# Patient Record
Sex: Male | Born: 1935 | Race: Black or African American | Hispanic: No | Marital: Married | Smoking: Former smoker
Health system: Southern US, Community
[De-identification: ages and names within clinical notes are randomized; demographics above are authoritative.]

## PROBLEM LIST (undated history)

## (undated) ENCOUNTER — Emergency Department (HOSPITAL_COMMUNITY): Admission: EM | Payer: MEDICARE | Source: Home / Self Care

## (undated) DIAGNOSIS — I2510 Atherosclerotic heart disease of native coronary artery without angina pectoris: Secondary | ICD-10-CM

## (undated) DIAGNOSIS — N186 End stage renal disease: Secondary | ICD-10-CM

## (undated) DIAGNOSIS — M1990 Unspecified osteoarthritis, unspecified site: Secondary | ICD-10-CM

## (undated) DIAGNOSIS — R609 Edema, unspecified: Secondary | ICD-10-CM

## (undated) DIAGNOSIS — E1140 Type 2 diabetes mellitus with diabetic neuropathy, unspecified: Secondary | ICD-10-CM

## (undated) DIAGNOSIS — I350 Nonrheumatic aortic (valve) stenosis: Secondary | ICD-10-CM

## (undated) DIAGNOSIS — E785 Hyperlipidemia, unspecified: Secondary | ICD-10-CM

## (undated) DIAGNOSIS — I951 Orthostatic hypotension: Secondary | ICD-10-CM

## (undated) DIAGNOSIS — I739 Peripheral vascular disease, unspecified: Secondary | ICD-10-CM

## (undated) DIAGNOSIS — D638 Anemia in other chronic diseases classified elsewhere: Secondary | ICD-10-CM

## (undated) DIAGNOSIS — Z7901 Long term (current) use of anticoagulants: Secondary | ICD-10-CM

## (undated) DIAGNOSIS — I10 Essential (primary) hypertension: Secondary | ICD-10-CM

## (undated) DIAGNOSIS — I4891 Unspecified atrial fibrillation: Secondary | ICD-10-CM

## (undated) DIAGNOSIS — N200 Calculus of kidney: Secondary | ICD-10-CM

## (undated) DIAGNOSIS — C61 Malignant neoplasm of prostate: Secondary | ICD-10-CM

## (undated) HISTORY — DX: Atherosclerotic heart disease of native coronary artery without angina pectoris: I25.10

## (undated) HISTORY — PX: AV FISTULA PLACEMENT: SHX1204

## (undated) HISTORY — DX: Calculus of kidney: N20.0

## (undated) HISTORY — DX: Edema, unspecified: R60.9

## (undated) HISTORY — DX: Unspecified atrial fibrillation: I48.91

## (undated) HISTORY — DX: Malignant neoplasm of prostate: C61

## (undated) HISTORY — DX: Peripheral vascular disease, unspecified: I73.9

## (undated) HISTORY — PX: HERNIA REPAIR: SHX51

## (undated) HISTORY — DX: Type 2 diabetes mellitus with diabetic neuropathy, unspecified: E11.40

## (undated) HISTORY — PX: TOE SURGERY: SHX1073

## (undated) HISTORY — DX: Essential (primary) hypertension: I10

## (undated) HISTORY — PX: VENTRAL HERNIA REPAIR: SHX424

## (undated) HISTORY — PX: POLYPECTOMY: SHX149

## (undated) HISTORY — PX: KIDNEY STONE SURGERY: SHX686

## (undated) HISTORY — DX: Long term (current) use of anticoagulants: Z79.01

## (undated) HISTORY — PX: RADIOACTIVE SEED IMPLANT: SHX5150

## (undated) HISTORY — DX: Hyperlipidemia, unspecified: E78.5

## (undated) HISTORY — PX: COLONOSCOPY: SHX174

## (undated) HISTORY — PX: TONSILLECTOMY: SHX5217

## (undated) HISTORY — PX: INGUINAL HERNIA REPAIR: SHX194

## (undated) HISTORY — DX: Unspecified osteoarthritis, unspecified site: M19.90

---

## 1994-01-08 HISTORY — PX: CORONARY ARTERY BYPASS GRAFT: SHX141

## 2001-12-09 ENCOUNTER — Encounter: Payer: Self-pay | Admitting: General Surgery

## 2001-12-09 ENCOUNTER — Encounter: Admission: RE | Admit: 2001-12-09 | Discharge: 2001-12-09 | Payer: Self-pay | Admitting: General Surgery

## 2003-04-13 HISTORY — PX: CARDIOVASCULAR STRESS TEST: SHX262

## 2003-04-19 ENCOUNTER — Inpatient Hospital Stay (HOSPITAL_BASED_OUTPATIENT_CLINIC_OR_DEPARTMENT_OTHER): Admission: RE | Admit: 2003-04-19 | Discharge: 2003-04-19 | Payer: Self-pay | Admitting: Cardiology

## 2003-04-19 HISTORY — PX: CARDIAC CATHETERIZATION: SHX172

## 2004-05-23 ENCOUNTER — Ambulatory Visit: Admission: RE | Admit: 2004-05-23 | Discharge: 2004-06-20 | Payer: Self-pay | Admitting: Radiation Oncology

## 2004-07-10 ENCOUNTER — Ambulatory Visit: Admission: RE | Admit: 2004-07-10 | Discharge: 2004-10-08 | Payer: Self-pay | Admitting: Radiation Oncology

## 2004-08-28 ENCOUNTER — Encounter: Admission: RE | Admit: 2004-08-28 | Discharge: 2004-08-28 | Payer: Self-pay | Admitting: Urology

## 2004-10-04 ENCOUNTER — Ambulatory Visit (HOSPITAL_COMMUNITY): Admission: RE | Admit: 2004-10-04 | Discharge: 2004-10-04 | Payer: Self-pay | Admitting: Urology

## 2004-10-04 ENCOUNTER — Ambulatory Visit (HOSPITAL_BASED_OUTPATIENT_CLINIC_OR_DEPARTMENT_OTHER): Admission: RE | Admit: 2004-10-04 | Discharge: 2004-10-04 | Payer: Self-pay | Admitting: Urology

## 2004-10-25 ENCOUNTER — Ambulatory Visit: Admission: RE | Admit: 2004-10-25 | Discharge: 2005-01-04 | Payer: Self-pay | Admitting: Radiation Oncology

## 2005-07-31 ENCOUNTER — Encounter: Payer: Self-pay | Admitting: Emergency Medicine

## 2005-08-01 ENCOUNTER — Encounter: Payer: Self-pay | Admitting: Cardiology

## 2005-08-01 ENCOUNTER — Observation Stay (HOSPITAL_COMMUNITY): Admission: EM | Admit: 2005-08-01 | Discharge: 2005-08-02 | Payer: Self-pay | Admitting: Cardiology

## 2006-07-08 ENCOUNTER — Ambulatory Visit: Payer: Self-pay | Admitting: Vascular Surgery

## 2006-08-01 ENCOUNTER — Ambulatory Visit: Payer: Self-pay | Admitting: *Deleted

## 2006-10-03 ENCOUNTER — Ambulatory Visit: Payer: Self-pay | Admitting: *Deleted

## 2007-10-02 ENCOUNTER — Ambulatory Visit: Payer: Self-pay | Admitting: *Deleted

## 2008-09-30 ENCOUNTER — Ambulatory Visit: Payer: Self-pay | Admitting: Vascular Surgery

## 2009-09-02 ENCOUNTER — Ambulatory Visit: Payer: Self-pay | Admitting: Cardiology

## 2009-09-06 ENCOUNTER — Ambulatory Visit: Payer: Self-pay | Admitting: Cardiology

## 2009-09-06 ENCOUNTER — Ambulatory Visit (HOSPITAL_COMMUNITY): Admission: RE | Admit: 2009-09-06 | Discharge: 2009-09-06 | Payer: Self-pay | Admitting: Cardiology

## 2009-09-06 HISTORY — PX: US ECHOCARDIOGRAPHY: HXRAD669

## 2009-12-15 ENCOUNTER — Ambulatory Visit: Payer: Self-pay | Admitting: Vascular Surgery

## 2009-12-15 ENCOUNTER — Ambulatory Visit: Payer: Self-pay | Admitting: Surgery

## 2010-03-10 ENCOUNTER — Ambulatory Visit (INDEPENDENT_AMBULATORY_CARE_PROVIDER_SITE_OTHER): Payer: MEDICARE | Admitting: Cardiology

## 2010-03-10 DIAGNOSIS — E119 Type 2 diabetes mellitus without complications: Secondary | ICD-10-CM

## 2010-03-10 DIAGNOSIS — I2510 Atherosclerotic heart disease of native coronary artery without angina pectoris: Secondary | ICD-10-CM

## 2010-03-10 DIAGNOSIS — I10 Essential (primary) hypertension: Secondary | ICD-10-CM

## 2010-03-10 DIAGNOSIS — Z951 Presence of aortocoronary bypass graft: Secondary | ICD-10-CM

## 2010-03-21 ENCOUNTER — Emergency Department (HOSPITAL_COMMUNITY): Payer: Medicare Other

## 2010-03-21 ENCOUNTER — Emergency Department (HOSPITAL_COMMUNITY)
Admission: EM | Admit: 2010-03-21 | Discharge: 2010-03-21 | Disposition: A | Discharge status: Home / Self Care | Payer: Medicare Other | Attending: Emergency Medicine | Admitting: Emergency Medicine

## 2010-03-21 DIAGNOSIS — N289 Disorder of kidney and ureter, unspecified: Secondary | ICD-10-CM | POA: Insufficient documentation

## 2010-03-21 DIAGNOSIS — I498 Other specified cardiac arrhythmias: Secondary | ICD-10-CM | POA: Insufficient documentation

## 2010-03-21 DIAGNOSIS — I10 Essential (primary) hypertension: Secondary | ICD-10-CM | POA: Insufficient documentation

## 2010-03-21 DIAGNOSIS — Z8546 Personal history of malignant neoplasm of prostate: Secondary | ICD-10-CM | POA: Insufficient documentation

## 2010-03-21 DIAGNOSIS — R42 Dizziness and giddiness: Secondary | ICD-10-CM | POA: Insufficient documentation

## 2010-03-21 DIAGNOSIS — I25810 Atherosclerosis of coronary artery bypass graft(s) without angina pectoris: Secondary | ICD-10-CM | POA: Insufficient documentation

## 2010-03-21 DIAGNOSIS — R0602 Shortness of breath: Secondary | ICD-10-CM | POA: Insufficient documentation

## 2010-03-21 DIAGNOSIS — E119 Type 2 diabetes mellitus without complications: Secondary | ICD-10-CM | POA: Insufficient documentation

## 2010-03-21 LAB — POCT I-STAT, CHEM 8
BUN: 67 mg/dL — ABNORMAL HIGH (ref 6–23)
Creatinine, Ser: 5.5 mg/dL — ABNORMAL HIGH (ref 0.4–1.5)
Glucose, Bld: 340 mg/dL — ABNORMAL HIGH (ref 70–99)
Sodium: 132 mEq/L — ABNORMAL LOW (ref 135–145)
TCO2: 18 mmol/L (ref 0–100)

## 2010-03-21 LAB — URINALYSIS, ROUTINE W REFLEX MICROSCOPIC
Bilirubin Urine: NEGATIVE
Glucose, UA: 250 mg/dL — AB
Hgb urine dipstick: NEGATIVE
Ketones, ur: NEGATIVE mg/dL
Protein, ur: NEGATIVE mg/dL

## 2010-03-21 LAB — CBC
HCT: 35.8 % — ABNORMAL LOW (ref 39.0–52.0)
MCH: 31.7 pg (ref 26.0–34.0)
MCHC: 32.7 g/dL (ref 30.0–36.0)
MCV: 97 fL (ref 78.0–100.0)
RDW: 13.7 % (ref 11.5–15.5)

## 2010-03-21 LAB — DIFFERENTIAL
Eosinophils Absolute: 0.2 10*3/uL (ref 0.0–0.7)
Eosinophils Relative: 2 % (ref 0–5)
Lymphocytes Relative: 17 % (ref 12–46)
Lymphs Abs: 1.4 10*3/uL (ref 0.7–4.0)
Monocytes Absolute: 0.6 10*3/uL (ref 0.1–1.0)
Monocytes Relative: 7 % (ref 3–12)

## 2010-03-21 LAB — GLUCOSE, CAPILLARY
Glucose-Capillary: 386 mg/dL — ABNORMAL HIGH (ref 70–99)
Glucose-Capillary: 130 mg/dL — ABNORMAL HIGH (ref 70–99)
Glucose-Capillary: 121 mg/dL — ABNORMAL HIGH (ref 70–99)

## 2010-04-03 ENCOUNTER — Encounter: Payer: Self-pay | Admitting: Cardiology

## 2010-04-03 ENCOUNTER — Inpatient Hospital Stay (HOSPITAL_COMMUNITY)
Admission: EM | Admit: 2010-04-03 | Discharge: 2010-04-05 | DRG: 394 | Disposition: A | Discharge status: Home / Self Care | Payer: Medicare Other | Attending: Internal Medicine | Admitting: Internal Medicine

## 2010-04-03 DIAGNOSIS — E860 Dehydration: Secondary | ICD-10-CM | POA: Diagnosis present

## 2010-04-03 DIAGNOSIS — M109 Gout, unspecified: Secondary | ICD-10-CM | POA: Diagnosis present

## 2010-04-03 DIAGNOSIS — Z79899 Other long term (current) drug therapy: Secondary | ICD-10-CM

## 2010-04-03 DIAGNOSIS — D500 Iron deficiency anemia secondary to blood loss (chronic): Secondary | ICD-10-CM | POA: Diagnosis present

## 2010-04-03 DIAGNOSIS — N179 Acute kidney failure, unspecified: Secondary | ICD-10-CM | POA: Diagnosis present

## 2010-04-03 DIAGNOSIS — N184 Chronic kidney disease, stage 4 (severe): Secondary | ICD-10-CM | POA: Diagnosis present

## 2010-04-03 DIAGNOSIS — T465X5A Adverse effect of other antihypertensive drugs, initial encounter: Secondary | ICD-10-CM | POA: Diagnosis present

## 2010-04-03 DIAGNOSIS — E119 Type 2 diabetes mellitus without complications: Secondary | ICD-10-CM | POA: Diagnosis present

## 2010-04-03 DIAGNOSIS — Z8546 Personal history of malignant neoplasm of prostate: Secondary | ICD-10-CM

## 2010-04-03 DIAGNOSIS — K648 Other hemorrhoids: Principal | ICD-10-CM | POA: Diagnosis present

## 2010-04-03 DIAGNOSIS — Z951 Presence of aortocoronary bypass graft: Secondary | ICD-10-CM

## 2010-04-03 DIAGNOSIS — I498 Other specified cardiac arrhythmias: Secondary | ICD-10-CM | POA: Diagnosis present

## 2010-04-03 DIAGNOSIS — D126 Benign neoplasm of colon, unspecified: Secondary | ICD-10-CM | POA: Diagnosis present

## 2010-04-03 DIAGNOSIS — I2510 Atherosclerotic heart disease of native coronary artery without angina pectoris: Secondary | ICD-10-CM | POA: Diagnosis present

## 2010-04-03 DIAGNOSIS — D631 Anemia in chronic kidney disease: Secondary | ICD-10-CM | POA: Diagnosis present

## 2010-04-03 DIAGNOSIS — I129 Hypertensive chronic kidney disease with stage 1 through stage 4 chronic kidney disease, or unspecified chronic kidney disease: Secondary | ICD-10-CM | POA: Diagnosis present

## 2010-04-03 DIAGNOSIS — N039 Chronic nephritic syndrome with unspecified morphologic changes: Secondary | ICD-10-CM | POA: Diagnosis present

## 2010-04-04 DIAGNOSIS — E119 Type 2 diabetes mellitus without complications: Secondary | ICD-10-CM

## 2010-04-04 DIAGNOSIS — D62 Acute posthemorrhagic anemia: Secondary | ICD-10-CM

## 2010-04-04 DIAGNOSIS — K625 Hemorrhage of anus and rectum: Secondary | ICD-10-CM

## 2010-04-04 DIAGNOSIS — R195 Other fecal abnormalities: Secondary | ICD-10-CM

## 2010-04-04 LAB — URINALYSIS, ROUTINE W REFLEX MICROSCOPIC
Glucose, UA: NEGATIVE mg/dL
Hgb urine dipstick: NEGATIVE
Ketones, ur: NEGATIVE mg/dL
Protein, ur: NEGATIVE mg/dL

## 2010-04-04 LAB — CBC
HCT: 34.3 % — ABNORMAL LOW (ref 39.0–52.0)
MCHC: 31.8 g/dL (ref 30.0–36.0)
MCV: 98.6 fL (ref 78.0–100.0)
RDW: 13.7 % (ref 11.5–15.5)

## 2010-04-04 LAB — BASIC METABOLIC PANEL
BUN: 44 mg/dL — ABNORMAL HIGH (ref 6–23)
CO2: 30 mEq/L (ref 19–32)
Chloride: 102 mEq/L (ref 96–112)
Creatinine, Ser: 3.8 mg/dL — ABNORMAL HIGH (ref 0.4–1.5)

## 2010-04-04 LAB — OCCULT BLOOD, POC DEVICE: Fecal Occult Bld: POSITIVE

## 2010-04-04 LAB — DIFFERENTIAL
Basophils Absolute: 0 10*3/uL (ref 0.0–0.1)
Eosinophils Relative: 2 % (ref 0–5)
Lymphocytes Relative: 19 % (ref 12–46)
Lymphs Abs: 1.5 10*3/uL (ref 0.7–4.0)
Monocytes Absolute: 0.5 10*3/uL (ref 0.1–1.0)

## 2010-04-04 LAB — TSH: TSH: 1.492 u[IU]/mL (ref 0.350–4.500)

## 2010-04-04 LAB — POCT CARDIAC MARKERS: CKMB, poc: <1 ng/mL — ABNORMAL LOW (ref 1.0–8.0)

## 2010-04-04 LAB — TROPONIN I: Troponin I: 0.06 ng/mL (ref 0.00–0.06)

## 2010-04-04 LAB — GLUCOSE, CAPILLARY: Glucose-Capillary: 166 mg/dL — ABNORMAL HIGH (ref 70–99)

## 2010-04-04 LAB — HEMOGLOBIN AND HEMATOCRIT, BLOOD
Hemoglobin: 11.6 g/dL — ABNORMAL LOW (ref 13.0–17.0)
Hemoglobin: 11.3 g/dL — ABNORMAL LOW (ref 13.0–17.0)

## 2010-04-04 LAB — CARDIAC PANEL(CRET KIN+CKTOT+MB+TROPI)
CK, MB: 1.5 ng/mL (ref 0.3–4.0)
Relative Index: 1.1 (ref 0.0–2.5)
Troponin I: 0.07 ng/mL — ABNORMAL HIGH (ref 0.00–0.06)

## 2010-04-04 LAB — CK TOTAL AND CKMB (NOT AT ARMC): Total CK: 129 U/L (ref 7–232)

## 2010-04-04 NOTE — H&P (Signed)
NAMEANTINIO, SANDERFER            ACCOUNT NO.:  [id number]  MEDICAL RECORD NO.:  [id number]           PATIENT TYPE:  E  LOCATION:  WLED                         FACILITY:  [facility]  PHYSICIAN:  Talmage Nap, MD  DATE OF BIRTH:  07-03-1931  DATE OF ADMISSION:  04/03/2010 DATE OF DISCHARGE:                             HISTORY & PHYSICAL   PRIMARY CARE PHYSICIAN:  Unassigned.  SOURCE OF INFORMATION:  History obtainable from the patient and the patient's spouse.  CHIEF COMPLAINT:  Painless rectal bleed and weakness of about 3 days' duration.  HISTORY OF PRESENT ILLNESS:  The patient is a 77 year old African- American male with history of coronary artery disease status post CABG, diabetes mellitus and hypertension, presenting to the Emergency Room with painless rectal bleed of about 3 days' duration and this was said to have been getting progressively worse.  The patient claimed that 3 days prior to presenting to the Emergency Room when he used the bathroom he noted that he was passing bright red blood in stool.  He denied any associated rectal pain.  He denied any associated abdominal discomfort. The patient claimed that he did not really pay much attention to it but as the day progressed, the rectal bleed was getting progressively worse and he was getting progressively weak and tired.  He denied any associated systemic symptoms, i.e., no fever.  No chills.  No rigor.  He also denied any history of chest pain or shortness of breath.  He claimed he was getting progressively weak.  Called his primary care physician who now requested the patient to come to the Emergency Room to be evaluated.  PAST MEDICAL HISTORY:  Positive for: 1. Coronary artery disease. 2. Hypertension. 3. Diabetes mellitus. 4. Gout. 5. Prostate CA. 6. Chronic kidney disease, not on dialysis.  PAST SURGICAL HISTORY: 1. Coronary artery disease status post CABG. 2. Ventral hernia repair. 3. Left  herniorrhaphy. 4. Right kidney stone status post nephostomy percutaneously. 5. Prostate CA status post seed implant.  MEDICATIONS:  Preadmission meds include: 1. Metoprolol, dose unknown. 2. Allopurinol 300 mg p.o. daily. 3. Vytorin 10/10 one a day. 4. Diltiazem 240 mg p.o. daily. 5. Lasix, dose unknown.  ALLERGIES:  He has no known allergies.  SOCIAL HISTORY:  Negative for tobacco use, occasionally take hard liquor and is retired.  Lives at home with his spouse.  FAMILY HISTORY:  Positive for coronary artery disease.  REVIEW OF SYSTEMS:  The patient denies any history of headaches.  No blurred vision.  Complained of dryness of the mouth.  No nausea.  No vomiting.  No chest pain or shortness of breath.  Complained about swelling that is painless on the right side of the neck.  Denies any PND or orthopnea.  No cough.  No abdominal discomfort.  No dysuria, hematuria but complained of hematochezia that is painless.  Has swelling of the lower extremity.  No intolerance to heat or cold and no neuropsychiatric disorder.  PHYSICAL EXAMINATION:  GENERAL:  Very pleasant elderly man, dehydrated, not in any obvious respiratory distress. VITAL SIGNS:  Blood pressure is 154/76, pulse is 59, respiratory rate 19, temperature is  97.7. HEENT:  Pallor but pupils are reactive to light and extraocular muscles are intact. NECK:  No jugular venous distention.  No carotid bruit.  No lymphadenopathy but has painless right neck swelling.  Cystic, nontender to touch. CHEST:  Clear to auscultation. HEART:  Heart sounds are 1 and 2. ABDOMEN:  Obese, nontender.  Liver, spleen, kidney not palpable.  Bowel sounds positive. EXTREMITIES:  +2 pedal edema. NEUROLOGIC:  Nonfocal. MUSCULOSKELETAL:  Arthritic changes in the knees and feet. NEUROPSYCHIATRIC:  Unremarkable. SKIN:  Slightly decreased turgor.  LABORATORY DATA:  Initial hematological indices showed WBC of 8.2, hemoglobin 10.9, hematocrit 34.3,  MCV of 98.6, platelet count of 167,000 with normal differential.  Chemistry revealed sodium of 141, potassium of 3.6, chloride of 102, bicarb of 30, glucose is 153, BUN 44, creatinine 3.80.  Coagulation profile showed PT 13.8, INR 1.04.  Urine microscopy unremarkable.  First set of cardiac markers, troponin I less than 0.05.  Fecal occult blood test positive.  EKG showed sinus bradycardia, Q-wave with nonspecific ST wave changes.  IMPRESSION: 1. Painless rectal bleed. 2. Anemia. 3. Dehydration. 4. Acute-on-chronic kidney disease. 5. Bradycardia. 6. Right neck swelling, painless. 7. Coronary artery disease status post coronary artery bypass     grafting. 8. Diabetes mellitus. 9. Gout. 10.Hypertension. 11.Prostate carcinoma status post seed implant.  PLAN:  Plan is to admit the patient to general medical floor.  The patient is going to be slowly rehydrated with half-normal saline IV to go at rate of 50 mL an hour.  He will be on Protonix 40 mg IV q.24h. Because of bradycardia, Lopressor and Cardizem will be on hold for now. Blood pressure will be controlled with hydralazine 25 mg p.o. b.i.d.  He will also be on Vytorin 10/10 one p.o. daily, allopurinol 300 mg p.o. daily and TED stockings for DVT prophylaxis.  The patient will also be on Accu-Cheks with Regular Insulin sliding scale.  Further labs ordered on this patient will include cardiac enzymes q.6h. x 3, H and H q.8h., thyroid panel which include TSH, T3 and T4 and hemoglobin A1c.  CBC, CMP and magnesium will be repeated in a.m.  Finally, GI physician will be consulted for further evaluation of this patient.  The patient will be followed and evaluated on day-to-day basis.     Talmage Nap, MD     CN/MEDQ  D:  04/04/2010  T:  04/04/2010  Job:  [id number]  Electronically Signed by Talmage Nap  on 04/04/2010 03:38:26 PM

## 2010-04-05 ENCOUNTER — Other Ambulatory Visit: Payer: Self-pay | Admitting: Internal Medicine

## 2010-04-05 DIAGNOSIS — D126 Benign neoplasm of colon, unspecified: Secondary | ICD-10-CM

## 2010-04-05 DIAGNOSIS — K648 Other hemorrhoids: Secondary | ICD-10-CM

## 2010-04-05 LAB — URINE CULTURE
Colony Count: NO GROWTH
Culture  Setup Time: 201203270848

## 2010-04-05 LAB — CARDIAC PANEL(CRET KIN+CKTOT+MB+TROPI)
CK, MB: 1.5 ng/mL (ref 0.3–4.0)
Total CK: 146 U/L (ref 7–232)
Troponin I: 0.08 ng/mL — ABNORMAL HIGH (ref 0.00–0.06)

## 2010-04-05 LAB — COMPREHENSIVE METABOLIC PANEL
ALT: 18 U/L (ref 0–53)
Calcium: 8.6 mg/dL (ref 8.4–10.5)
Creatinine, Ser: 3.26 mg/dL — ABNORMAL HIGH (ref 0.4–1.5)
GFR calc Af Amer: 22 mL/min — ABNORMAL LOW (ref >60)
Glucose, Bld: 151 mg/dL — ABNORMAL HIGH (ref 70–99)
Sodium: 141 mEq/L (ref 135–145)
Total Protein: 5.9 g/dL — ABNORMAL LOW (ref 6.0–8.3)

## 2010-04-05 LAB — GLUCOSE, CAPILLARY
Glucose-Capillary: 161 mg/dL — ABNORMAL HIGH (ref 70–99)
Glucose-Capillary: 154 mg/dL — ABNORMAL HIGH (ref 70–99)
Glucose-Capillary: 153 mg/dL — ABNORMAL HIGH (ref 70–99)

## 2010-04-05 LAB — DIFFERENTIAL
Basophils Absolute: 0 10*3/uL (ref 0.0–0.1)
Basophils Relative: 0 % (ref 0–1)
Lymphocytes Relative: 21 % (ref 12–46)
Neutro Abs: 4.8 10*3/uL (ref 1.7–7.7)

## 2010-04-05 LAB — HEMOGLOBIN A1C
Hgb A1c MFr Bld: 8.6 % — ABNORMAL HIGH (ref <5.7)
Mean Plasma Glucose: 200 mg/dL — ABNORMAL HIGH (ref <117)

## 2010-04-05 LAB — CBC
HCT: 31.2 % — ABNORMAL LOW (ref 39.0–52.0)
Hemoglobin: 9.8 g/dL — ABNORMAL LOW (ref 13.0–17.0)
RDW: 13.5 % (ref 11.5–15.5)
WBC: 6.9 10*3/uL (ref 4.0–10.5)

## 2010-04-05 LAB — MAGNESIUM: Magnesium: 1.7 mg/dL (ref 1.5–2.5)

## 2010-04-06 ENCOUNTER — Telehealth: Payer: Self-pay

## 2010-04-06 NOTE — Telephone Encounter (Signed)
Spoke with pt and he is aware of his previsit 04/11/10@11am  and his colon 04/20/10@9am . Pt aware of appt dates and times.

## 2010-04-07 NOTE — Discharge Summary (Signed)
NAMESHALOM, MCGUINESS            ACCOUNT NO.:  [id number]  MEDICAL RECORD NO.:  [id number]           PATIENT TYPE:  I  LOCATION:  1342                         FACILITY:  [facility]  PHYSICIAN:  Marinda Elk, M.D.DATE OF BIRTH:  1931/04/12  DATE OF ADMISSION:  04/03/2010 DATE OF DISCHARGE:  04/05/2010                              DISCHARGE SUMMARY   PRIMARY CARE DOCTOR:  Unassigned.  DISCHARGE DIAGNOSES: 1. Rectal bleeding most likely secondary to internal hemorrhoids. 2. Anemia. 3. Dehydration. 4. Acute on chronic kidney disease, stage IV. 5. Bradycardia secondary to metoprolol.  DISCHARGE MEDICATIONS: 1. Allopurinol 300 mg p.o. daily. 2. Aspirin 325 mg to start on April 12, 2008. 3. Diltiazem 240 mg daily. 4. Doxazosin 2 mg p.o. at bedtime. 5. Estazolam 2 mg 1/2 tablet at bedtime. 6. Lasix 80 mg p.o. b.i.d. 7. Glimepiride 4 mg daily. 8. Januvia 100 mg daily. 9. Klor-Con 20 mEq 2 tabs p.o. daily. 10.Mag oxide p.o. daily. 11.Metolazone 2.5 mg 1 tablet daily. 12.Metoprolol 50 mg 1/2 tablet b.i.d. 13.Vitamin B12 50,000 units weekly. 14.Vytorin 10/40 mg 1 tablet daily. 15.Zemplar 1 mcg daily.  PROCEDURES PERFORMED:  Colonoscopy shows three polyps removed, but it was normal but incomplete colonoscopy.  Internal hemorrhoids likely cause for recent bleeding.  CONSULTANTS:  GI, John N. Marina Goodell, MD  BRIEF ADMITTING HISTORY AND PHYSICAL:  This is a 77 year old male with past medical history of coronary artery disease, status post CABG; diabetes mellitus; hypertension;  chronic kidney disease, stage IV that comes in for 3 days of painless rectal bleeding.  Please refer to dictation on April 04, 2010, for further details.  PLAN: 1. Rectal bleeding secondary most likely to internal hemorrhoids.  He     was admitted.  GI was consulted.  They recommended colonoscopy.     Colonoscopy was done with results above.  His hemoglobin dropped     from 10.9 to 9.8.  He is currently  asymptomatic.  He will follow up     with [facility] as an outpatient to follow up on results of     colonoscopy.  We will hold his aspirin for a week and then he will     restart it.  He has an appointment for [facility] gastroenterologist     to follow up on colonoscopy results as an outpatient. 2. Anemia secondary to rectal bleeding and chronic kidney disease.  He     will follow up with [facility] as an outpatient.  We will continue     to hold his aspirin. 3. Dehydration, now resolved. 4. Acute on chronic kidney injury, stage IV, currently stable.  We     will hold his Lasix and metolazone for 1 day and then we will     resume 1 day after discharge.  VITALS ON THE DAY OF DISCHARGE:  Temperature 98, pulse 70, respirations 18, blood pressure 163/72.  He was sating 99% on room air.  LABORATORIES ON THE DAY OF DISCHARGE:  Cardiac enzymes showed 0.08.  His mag was 1.7.  His sodium was 141, potassium 3.1, chloride 103, bicarb of 31, glucose of 151, BUN of 37, creatinine  of 3.2.  LFTs within normal limits except for his alkaline phosphatase which is 37.  His white count is 6.9, hemoglobin of 9.8, and platelet count of 150.  His ANC is 4.8. Hemoglobin A1c is 8.6.  DISPOSITION:  The patient will follow up with his primary care doctor this week.  Here we will check a CBC to see how his hemoglobin is doing. He will also follow up with [facility] in 2-4 weeks to follow up on polyp results.     Marinda Elk, M.D.     AF/MEDQ  D:  04/05/2010  T:  04/06/2010  Job:  [id number]  Electronically Signed by Lambert Keto M.D. on 04/07/2010 84:69:62 PM

## 2010-04-11 ENCOUNTER — Ambulatory Visit (AMBULATORY_SURGERY_CENTER): Payer: MEDICARE

## 2010-04-11 ENCOUNTER — Encounter: Payer: Self-pay | Admitting: Internal Medicine

## 2010-04-11 VITALS — Ht 72.0 in | Wt 297.2 lb

## 2010-04-11 DIAGNOSIS — K625 Hemorrhage of anus and rectum: Secondary | ICD-10-CM

## 2010-04-11 MED ORDER — PEG-KCL-NACL-NASULF-NA ASC-C 100 G PO SOLR: 1.0000 | Freq: Once | ORAL | Status: AC

## 2010-04-11 NOTE — Procedures (Signed)
Summary: Colonoscopy  Patient: Alan [name] Note: All result statuses are Final unless otherwise noted.  Tests: (1) Colonoscopy (COL)   COL Colonoscopy           DONE     [facility]     [address] [place], [place]  [address]          COLONOSCOPY PROCEDURE REPORT          PATIENT:  Alan [name], Alan [name]  MR#:  [id number]     BIRTHDATE:  02-Jun-1931, 78 yrs. old  GENDER:  male     ENDOSCOPIST:  Wilhemina Bonito. Eda Keys, MD     REF. BY:  [facility],     PROCEDURE DATE:  04/05/2010     PROCEDURE:  Colonoscopy with snare polypectomy x 3     ASA CLASS:  Class II     INDICATIONS:  rectal bleeding     MEDICATIONS:   Fentanyl 125 mcg, Versed 12 mg          DESCRIPTION OF PROCEDURE:   After the risks benefits and     alternatives of the procedure were thoroughly explained, informed     consent was obtained.  Digital rectal exam was performed and     revealed no abnormalities.   The Pentax Colonoscope [id number]     endoscope was introduced through the anus and advanced to the     hepatic flexure, limited by difficult to sedate adequately, body     habitus.    The quality of the prep was excellent, using MiraLax.     The instrument was then slowly withdrawn as the colon was fully     examined.     <<PROCEDUREIMAGES>>          FINDINGS:  The exam could only be carried out to the hepatic     flexure due to limitations as above. No blood in the colon.Three     polyps were found in the sigmoid (68mm,10mm) and descending colon     (5mm) segments. Polyps were snared without cautery. Retrieval was     successful.   Otherwise normal incomplete colonoscopy without     other polyps, masses, vascular ectasias, diverticulosis or     inflammatory changes.   Retroflexed views in the rectum revealed     internal hemorrhoids.    The scope was then withdrawn from the     patient and the procedure completed.          COMPLICATIONS:  None     ENDOSCOPIC IMPRESSION:     1) Three  polyps - removed     2) Otherwise normal but incomplete colonoscopy (see above)     3) Internal hemorrhoids - likely cause for recent bleeding          RECOMMENDATIONS:     1)   OK FOR D/C HOME LATER TODAY WITH WIFE.     2) REPEAT COLONOSCOPY IN NEAR FUTURE AT [facility]     WITH PROPOFOL / MAC AND ADJUSTABLE ADULT SCOPE          ______________________________     Wilhemina Bonito. Eda Keys, MD          CC:  Hali Marry, MD; The Patient          n.     eSIGNED:   Wilhemina Bonito. Eda Keys at 04/05/2010 10:20 AM          Eusebio Me, Siri Cole, [phone]  Note: An exclamation mark (!) indicates a result that was not dispersed into the flowsheet. Document Creation Date: 04/05/2010 10:21 AM _______________________________________________________________________  (1) Order result status: Final Collection or observation date-time: 04/05/2010 10:08 Requested date-time:  Receipt date-time:  Reported date-time:  Referring Physician:   Ordering Physician: Fransico Setters [id number]) Specimen Source:  Source: Launa Grill Order Number: [id number] Lab site:

## 2010-04-20 ENCOUNTER — Encounter: Payer: Self-pay | Admitting: Internal Medicine

## 2010-04-20 ENCOUNTER — Ambulatory Visit (AMBULATORY_SURGERY_CENTER): Payer: MEDICARE | Admitting: Internal Medicine

## 2010-04-20 VITALS — BP 153/59 | HR 67 | Temp 98.1°F | Resp 20 | Ht 72.0 in | Wt 290.0 lb

## 2010-04-20 DIAGNOSIS — Z1211 Encounter for screening for malignant neoplasm of colon: Secondary | ICD-10-CM

## 2010-04-20 DIAGNOSIS — Z860100 Personal history of colon polyps, unspecified: Secondary | ICD-10-CM

## 2010-04-20 DIAGNOSIS — D126 Benign neoplasm of colon, unspecified: Secondary | ICD-10-CM

## 2010-04-20 LAB — GLUCOSE, CAPILLARY: Glucose-Capillary: 159 mg/dL — ABNORMAL HIGH (ref 70–99)

## 2010-04-20 MED ORDER — SODIUM CHLORIDE 0.9 % IV SOLN: 500.0000 mL | INTRAVENOUS | Status: DC

## 2010-04-20 NOTE — Progress Notes (Signed)
  Subjective:    Patient ID: Alan [name], male    DOB: 08/22/1931, 77 y.o.   MRN: [id number]  HPI    Review of Systems     Objective:   Physical Exam        Assessment & Plan:  To bathroom with assistance and use of cane.  Tol well.

## 2010-04-20 NOTE — Patient Instructions (Signed)
Please refer to green and blue discharge instructions sheet, as well as educational hand-out on polyps.  Call [phone] after 5 pm for questions or concerns.  [facility] staff will call you in the morning as a courtesy.

## 2010-04-21 ENCOUNTER — Telehealth: Payer: Self-pay | Admitting: *Deleted

## 2010-04-21 NOTE — Telephone Encounter (Signed)

## 2010-05-09 ENCOUNTER — Ambulatory Visit: Payer: Medicare Other | Admitting: Vascular Surgery

## 2010-05-23 ENCOUNTER — Encounter (INDEPENDENT_AMBULATORY_CARE_PROVIDER_SITE_OTHER): Payer: MEDICARE

## 2010-05-23 ENCOUNTER — Ambulatory Visit (INDEPENDENT_AMBULATORY_CARE_PROVIDER_SITE_OTHER): Payer: MEDICARE | Admitting: Vascular Surgery

## 2010-05-23 DIAGNOSIS — N186 End stage renal disease: Secondary | ICD-10-CM

## 2010-05-23 DIAGNOSIS — N184 Chronic kidney disease, stage 4 (severe): Secondary | ICD-10-CM

## 2010-05-23 DIAGNOSIS — Z01810 Encounter for preprocedural cardiovascular examination: Secondary | ICD-10-CM

## 2010-05-23 NOTE — Assessment & Plan Note (Signed)
OFFICE VISIT   Alan [name], Alan [name]  DOB:  1931-02-26                                       12/15/2009  [id number]#:[id number]   Patient is a very pleasant 77 year old gentleman who was last seen by  Dr. Madilyn Fireman in 2008.  He has peripheral vascular disease and is followed  with serial ABIs.  He returns to clinic today stating that he does have  problems with his legs; however, this is more related to diabetic  neuropathy.  He states that he is not having any problems claudication-  type symptoms.   PAST MEDICAL HISTORY:  Significant for diabetes and hypertension.   SOCIAL HISTORY:  He is married and lives with his wife.  He is retired.  He had a remote history of tobacco use with discontinuation  approximately 20 years ago.   FAMILY HISTORY:  Noncontributory.   REVIEW OF SYSTEMS:  Significant for pain in his legs when walking and  pain in his feet when lying flat.  All other interrogatories were  answered in the negative fashion.   PHYSICAL FINDINGS:  A very pleasant elderly gentleman who walks with a  cane.  Height was 6 feet.  Weight is 290 pounds.  Heart rate was 57,  blood pressure 125/67 with O2 sat 96%.  HEENT:  PERRLA, EOMI with normal  conjunctiva.  Trachea was midline.  Cardiac exam revealed a regular rate  and rhythm.  Lungs were clear.  Abdomen was soft, nontender.  There were  no major deformities of his musculoskeletal system.  Neurological exam  demonstrated a stocking-glove sensory loss in his lower extremities  bilaterally.  There were no ulcers or rashes on his legs.   LABORATORY RESULTS:  Bilateral ankle brachial and toe brachial indices  were unable to be obtained due to a known noncompressibility of the  bilateral brachial and tibial arteries.  The waveforms were __________  monophasic.  He had stable bilateral great toe pressures when compared  to the previous exam.  On 09/30/2008, the right toe pressure was 94 and  the left was 82.   Today, the right great toe pressure was 85, and the  left was 73.   ASSESSMENT/PLAN:  We will see patient back in 1 year for follow-up.  He  was instructed in maintaining careful surveillance of his feet due to  his vascular insufficiency and diabetic neuropathies.  Should he develop  worsening symptoms prior to his next visit, he knows to give [place] a call.   He will continue under the care of Dr. Kathrene Bongo for his kidneys and  his cardiologist for his coronary artery disease.   Wilmon Arms, PA   Janetta Hora. Fields, MD  Electronically Signed   KEL/MEDQ  D:  12/15/2009  T:  12/15/2009  Job:  [id number]

## 2010-05-23 NOTE — Procedures (Signed)
LOWER EXTREMITY ARTERIAL EVALUATION-SINGLE LEVEL   INDICATION:  Follow-up evaluation for bilateral lower extremity pain and  known peripheral vascular disease.   HISTORY:  Patient complains of nocturnal legs cramping and has known peripheral  vascular disease.  Diabetes:  Orally controlled.  Cardiac:  Coronary artery bypass graft in 1998.  Hypertension:  Yes.  Smoking:  No.  Previous Surgery:  Right fifth toe amputation.   REFERRING PHYSICIAN:  Veverly Fells. Altheimer, M.D.   RESTING SYSTOLIC PRESSURES: (ABI)                          RIGHT                LEFT  Brachial:               160                  168  Anterior tibial:        90 (0.54)            78 (0.46)  Posterior tibial:       88                   218 (calcified)  Peroneal:  DOPPLER WAVEFORM ANALYSIS:  Anterior tibial:        Monophasic           Monophasic  Posterior tibial:       Monophasic           Monophasic  Peroneal:   PREVIOUS ABI'S:  Date: September 18, 2004,  RIGHT:  0.56  LEFT:  0.73   IMPRESSION:  The right ABI is stable from previous study.   The left posterior tibial artery is calcified, however, the ABI  calculated by the left anterior tibial artery is lower than previously  recorded.   ___________________________________________  P. Liliane Bade, M.D.   MC/MEDQ  D:  07/08/2006  T:  07/08/2006  Job:  [id number]

## 2010-05-23 NOTE — Assessment & Plan Note (Signed)
OFFICE VISIT   MANDELL, PANGBORN  DOB:  1931-08-07                                       10/03/2006  [id number]#:[id number]   The patient returns today for continued followup of his peripheral  vascular disease.  Underwent lower extremity arterial evaluation  07/08/2006 revealing bilateral monophasic tibial wave forms.  Ankle  brachial index 0.54 on the right, 0.46 on the left.  These have declined  over the past couple of years from values of 0.56 and 0.73 respectively.   Complains of chronic problems with edema.  Limited activity level.  Obesity remains a problems.   BP 160/85, pulse 63, respirations 18.  Lower extremity evaluation  reveals no palpable distal pulses.  No acute ischemic changes.  Skin is  intact in his feet.  No evidence of infection.   Will continue monitoring on a yearly basis.  Return ankle brachial index  at that time.   Balinda Quails, M.D.  Electronically Signed   PGH/MEDQ  D:  10/03/2006  T:  10/04/2006  Job:  [id number]

## 2010-05-23 NOTE — Assessment & Plan Note (Signed)
OFFICE VISIT   JAMARIS, THEARD  DOB:  21-May-1931                                       08/01/2006  [id number]#:[id number]   Mr. Payette returns today for yearly followup.  In the Vascular Lab  July 08, 2006, reveal ABIs of 0.54 on the right, 0.46 on the left.  There has been a significant drop on the left from a previous value of  0.73 in 2006.  The right remains stable. He has no night pain or rest  pain.  Denies nonhealing ulcers or gangrene.  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  He has claudication symptoms and chronic lower extremity edema.   Blood pressure is 152/79, pulse 67 per minute and regular respirations  18 per minute.  Femoral pulse is palpable bilaterally. No popliteal, posterior or tibial  dorsalis pedis pulses.  No ischemic skin changes of the feet.  Two to  three plus edema of the ankles.   Mr. Qazi remains quite stable.  We will plan to follow up with him  again in 1 year.  He is having some unsteadiness of his gait, I have  asked him to follow up with Dr. Leslie Dales regarding this.  His weight is  clearly a problem and I have recommended that he really look at trying  to lose some.   Balinda Quails, M.D.  Electronically Signed   PGH/MEDQ  D:  08/01/2006  T:  08/02/2006  Job:  [id number]   cc:   Veverly Fells. Altheimer, M.D.

## 2010-05-24 NOTE — Assessment & Plan Note (Signed)
OFFICE VISIT  Alan [name], Alan [name] DOB:  01/19/1931                                       05/23/2010 [id number]#:[id number]  Patient presents today for discussion regarding renal access.  He is a 77 year old gentleman with progressive chronic renal insufficiency.  He is known to our practice from a prior evaluation for lower extremity arterial insufficiency by Dr. Liliane Bade.  This has been stable.  He now has had progression of renal disease, and we are asked to see him for consideration for access placement.  He did undergo a vein map in our office today which I have reviewed and discussed with patient and his wife.  He is right-handed.  PHYSICAL EXAMINATION:  He is in no acute distress.  He has diminished brachial and radial pulses bilaterally.  He has moderate-sized surface veins bilaterally.  He underwent noninvasive vascular laboratory studies in our office, and this reveals acceptable size for attempted fistula in his right upper arm.  The veins are too small in the left arm.  I did review prior noninvasive studies in our office from approximately a year ago, and this showed noncompressible brachial arteries at that time.  I am concerned due to his arterial disease.  I explained to patient and his wife that it is uncommon that __________ limitation for venous access, but certainly I think that this may be his case.  He does have a palpable pulse on the right; therefore, I have recommended that we attempt a right upper arm AV fistula and explained that this may fail to mature.  I also explained the potential for steal.  We will schedule this at his earliest convenience on 05/24 at [facility].    Larina Earthly, M.D. Electronically Signed  TFE/MEDQ  D:  05/23/2010  T:  05/24/2010  Job:  [id number]  cc:   Alan [name], M.D.

## 2010-05-25 NOTE — Procedures (Unsigned)
CEPHALIC VEIN MAPPING  INDICATION:  Preoperative vein mapping for AVF placement.  HISTORY: Chronic kidney disease, stage 4, hypertension, diabetes.  EXAM: The right cephalic vein is compressible.  Diameter measurements range from 0.22 to 0.39 cm.  The right basilic vein is compressible.  Diameter measurements range from 0.35 to 0.36 cm.  The left cephalic vein is not compressible.  Diameter measurements range from 0.13 to 0.29 cm.  The left basilic vein is compressible.  Diameter measurements range from 0.20 to 0.27 cm.  See attached worksheet for all measurements.  IMPRESSION:  Patent right and left cephalic and basilic veins with diameter measurements as described above.  ___________________________________________ Larina Earthly, M.D.  EM/MEDQ  D:  05/23/2010  T:  05/23/2010  Job:  [id number]

## 2010-05-26 NOTE — Op Note (Signed)
Alan [name], Alan [name]            ACCOUNT NO.:  [id number]   MEDICAL RECORD NO.:  [id number]          PATIENT TYPE:  AMB   LOCATION:  NESC                         FACILITY:  [facility]   PHYSICIAN:  Bertram Millard. Dahlstedt, M.D.DATE OF BIRTH:  04/15/1931   DATE OF PROCEDURE:  10/04/2004  DATE OF DISCHARGE:                                 OPERATIVE REPORT   PREOPERATIVE DIAGNOSIS:  Adenocarcinoma of the prostate, clinical stage T1C,  Gleason 3+3.   POSTOPERATIVE DIAGNOSIS:  Adenocarcinoma of the prostate, clinical stage  T1C, Gleason 3+3.   PRINCIPAL PROCEDURE:  Placement of radioactive iodine seeds, cystoscopy.   SURGEON:  Bertram Millard. Retta Diones, M.D.   RADIATION ONCOLOGIST:  Maryln Gottron, M.D.   ANESTHESIA:  General endotracheal.   COMPLICATIONS:  None.   BRIEF HISTORY:  This 76 year old male presents at this time for placement of  iodine seeds/brachytherapy. He was recently diagnosed with adenocarcinoma of  the prostate which was eventually detected due to an elevating PSA, most  recently 5.7 before his ultrasound and biopsy which was done in the summer.  He was found to have bilateral adenocarcinoma, Gleason 3+3. The patient has  decided to undergo brachytherapy. He was counseled on the alternatives as  well as risks and complications. He desires to proceed.   DESCRIPTION OF PROCEDURE:  The patient was administered preoperative IV  antibiotics and a general anesthetic was placed in the lithotomy position.  Genitalia and perineum were prepped and draped. A Foley catheter was placed  with contrast in the balloon. A rectal tube was placed. Transrectal  ultrasound probe was placed by Dr. Dayton Scrape. The prostate, urethra and rectum  were imaged, and diagrammed within the computer program. The volume of the  prostate was under 25 mL. Once the computerized program of the prostate was  performed, and needle placement was determined, the needles were placed. A  total of 24 activated  needles were used to place 60 seeds. Each seed had  23.88 mCi. Following placement that was done according to the computerized  template, a fluoroscopic picture was taken of the seed placement. The  catheter was removed and cystoscopy revealed two spacers to be present  within the bladder. One of these was removed to confirm that they were  spacers and not radioactive seeds. One was left indwelling, as it was felt  that the patient would void this out. A Foley catheter was replaced after  cystoscopic procedure was performed. It was hooked to dependent drainage.   The patient tolerated the procedure well. He was awakened, extubated, taken  to PACU in stable condition.   He will be sent home on 3 days of Cipro 250 mg b.i.d., Vicodin 1-2 p.o. q.4  h p.r.n. pain (#15), and he will be given instructions for catheter removal.  He will follow-up in 3 weeks.      Bertram Millard. Dahlstedt, M.D.  Electronically Signed     SMD/MEDQ  D:  10/04/2004  T:  10/05/2004  Job:  [id number]   cc:   Maryln Gottron, M.D.  Fax: [phone]

## 2010-05-26 NOTE — Discharge Summary (Signed)
NAMEJAREL, CUADRA            ACCOUNT NO.:  [id number]   MEDICAL RECORD NO.:  [id number]          PATIENT TYPE:  OBV   LOCATION:  3711                         FACILITY:  [facility]   PHYSICIAN:  Peter M. Swaziland, M.D.  DATE OF BIRTH:  10/06/1931   DATE OF ADMISSION:  08/01/2005  DATE OF DISCHARGE:  08/02/2005                                 DISCHARGE SUMMARY   HISTORY OF PRESENT ILLNESS:  Mr. Carll is a 77 year old black male who  presents with symptoms of increased lower extremity edema.  He states that  normally they go down at night but that they have remained elevated.  He has  had no blistering.  He denies any shortness of breath, chest pain,  palpitations, dizziness, syncope or any other cardiac complaints.  He was  admitted for further evaluation of his increased edema.  The patient has a  history of coronary artery disease and is status post CABG.  He did have a  cardiac catheterization approximately 2 years ago that showed that all his  grafts were patent and his left ventricular function was normal.  He also  has a history of diabetes, hypertension, dyslipidemia and chronic venous  insufficiency.  For details of his past medical history, social history,  family history and physical exam, please see admission history and physical.   LABORATORY DATA:  His ECG shows normal sinus rhythm with low voltage.  There  is evidence of old septal infarction.  Hemoglobin 13.1, hematocrit 39.1,  white count 7900, platelets 222,000.  Sodium 141, potassium 4.3, chloride  101, CO2 27, BUN 24, creatinine 1.5, glucose of 113.  Coagulation studies  were normal.  Liver function studies were normal except for AST of 43,  albumin was 3.3.  Cardiac enzymes in serial fashion showed persistently mild  elevation of troponin levels, 0.08, 0.07, 0.07, 0.06.  CPK MBs were all  negative.  Magnesium 1.7.  Urinalysis was normal.  BNP level was 91.6 and D-  dimer level was 0.79.  Chest x-ray showed  cardiomegaly with some pulmonary  venous hypertension but no active disease.   HOSPITAL COURSE:  The patient was admitted to telemetry.  He was treated  with IV Lasix with modest diuresis.  He did have an echocardiogram performed  that showed mild concentric LVH with normal systolic function.  He had  normal estimated right heart pressures.  He had moderate aortic valve  sclerosis without stenosis.  He also had lower extremity venous Dopplers,  which were negative for DVT.  It was felt that his edema was due to chronic  venous insufficiency and dietary indiscretion.  We did increase his Lasix to  80 mg daily and start him on potassium supplement.  We recommended that he  wear thigh-high support hose when ambulatory and have reviewed with him  recommendations for strict sodium restriction.  The patient was felt to be  stable for discharge on 07July 26, 2007.   DISCHARGE DIAGNOSES:  1.  Lower extremity edema secondary to chronic venous insufficiency.  2.  Coronary disease status post coronary artery bypass grafting in 1998.  3.  Diabetes  mellitus type 2.  4.  Hypertension.  5.  Dyslipidemia.  6.  History of gout.  7.  History of prostate cancer.   DISCHARGE MEDICATIONS:  1.  Coated aspirin 325 mg daily.  2.  Zocor 80 mg per day.  3.  Allopurinol 300 mg daily.  4.  Gemfibrozil 600 mg twice a day.  5.  Glipizide 10 mg twice a day.  6.  Metformin 500 mg twice a day.  7.  Metoprolol 50 mg twice a day.  8.  Norvasc 10 mg daily.  9.  Quinapril 20 mg per day.  10. Lasix 80 mg per day.  11. Potassium 20 mEq daily.   The patient is to wear thigh-high support hose.  Will have follow-up with  Dr. Swaziland in 2 weeks.   DISCHARGE STATUS:  Improved.           ______________________________  Peter M. Swaziland, M.D.     PMJ/MEDQ  D:  08/02/2005  T:  08/02/2005  Job:  [id number]   cc:   Veverly Fells. Altheimer, M.D.  Fax: [phone]

## 2010-05-26 NOTE — Cardiovascular Report (Signed)
NAME:  Alan [name], Alan [name]                      ACCOUNT NO.:  [id number]   MEDICAL RECORD NO.:  [id number]                   PATIENT TYPE:  OIB   LOCATION:  6501                                 FACILITY:  [facility]   PHYSICIAN:  Peter M. Swaziland, M.D.               DATE OF BIRTH:  12/12/1931   DATE OF PROCEDURE:  04/19/2003  DATE OF DISCHARGE:                              CARDIAC CATHETERIZATION   INDICATION FOR PROCEDURE:  The patient is a 77 year old black male status  post coronary artery bypass surgery in 1996.  Routine followup is noted on  adenosine Cardiolite study to have evidence of anterior and apical ischemia.  He has been asymptomatic.  He has multiple cardiac risk factors including  history of diabetes, hypertension, hyperlipidemia.   PROCEDURE:  Left heart catheterization, coronary graft angiography, left  ventricular angiography and IMA angiography.   ACCESS:  Via the right femoral artery using standard Seldinger technique.   EQUIPMENT:  5 French 4 cm right and left Judkins catheter, 5 French pigtail  catheter, 5 French arterial sheath.   MEDICATIONS:  Local anesthesia with 1% Xylocaine.   CONTRAST:  150 mL of Omnipaque.   HEMODYNAMIC DATA:  Left ventricular pressure 144 with EDP of 22 mmHg.  Aortic pressure 147/73 with a mean of 106 mmHg.   ANGIOGRAPHIC DATA:  The left coronary artery arises and distributes  normally.  The left main coronary artery is without significant disease.   The left anterior descending artery is occluded proximally.   The left circumflex coronary has mild irregularities less than or equal to  20%.   The right coronary artery has a focal 80% stenosis at the mid vessel.  The  distal branches fill via a vein graft.   Saphenous vein graft to the first diagonal is widely patent.  There is  retrograde filling of a very small second diagonal branch.   Saphenous vein graft seemed to sequentially fill the acute marginal, PDA,  posterior lateral  branch of the right coronary artery.  This graft is widely  patent with excellent runoff.   LIMA graft to the LAD is widely patent with good distal runoff.  There is  small vessel disease in the far distal LAD that wrapped around the apex, but  no focal stenosis.   The left subclavian is widely patent with no stenosis.   LEFT VENTRICULAR ANGIOGRAPHY:  Performed in the RAO view demonstrates normal  left ventricular size and contractility with normal systolic function.  Ejection fraction is estimated at 60%.  There is no significant mitral  insufficiency or prolapse.   FINAL INTERPRETATION:  1. Severe two-vessel obstructive atherosclerotic coronary artery disease.  2. Continued graft patency including saphenous vein graft to the first     diagonal, saphenous vein graft sequentially to [facility] marginal, posterior     descending artery and posterior lateral branches of the right coronary     artery and patent left  internal mammary artery graft to the left anterior     descending.  3. Normal left ventricular function.   PLAN:  Would recommend continued medical therapy.                                               Peter M. Swaziland, M.D.    PMJ/MEDQ  D:  04/19/2003  T:  04/19/2003  Job:  [id number]   cc:   Veverly Fells. Altheimer, M.D.  [address]  [place]  [place] [address]  Fax: [phone]

## 2010-05-26 NOTE — H&P (Signed)
NAME:  Alan [name], Alan [name]                      ACCOUNT NO.:  [id number]   MEDICAL RECORD NO.:  [id number]                   PATIENT TYPE:  OIB   LOCATION:  NA                                   FACILITY:  [facility]   PHYSICIAN:  Peter M. Swaziland, M.D.               DATE OF BIRTH:  1931/06/07   DATE OF ADMISSION:  04/19/2003  DATE OF DISCHARGE:                                HISTORY & PHYSICAL   HISTORY OF PRESENT ILLNESS:  Mr. Eldridge Dace is a 77 year old black male who  has a known history of coronary artery disease.  He is status post coronary  artery bypass surgery in 1998 by Dr. Laneta Simmers.  At that time, he had an LIMA  graft placed to the LAD, a sequential saphenous vein graft to the first and  second diagonal branches, and a sequential saphenous vein graft to the acute  marginal PDA and posterolateral branches of the right coronary artery.  The  patient has done well from a symptomatic standpoint, but has been fairly  sedentary.  He denies any chest pain or shortness of breath.  He was  recently referred by Dr. Leslie Dales for followup evaluation.  Given the time  from his bypass surgery and his multiple cardiac risk factors, including  longstanding diabetes, we did perform a stress Cardiolite study.  The  patient had limited exercise tolerance and was found to have evidence of  anterior and apical ischemia by Cardiolite scan.  His ejection fraction was  normal at 56%.  Based on these abnormal findings, he is now admitted for  cardiac catheterization for further evaluation.   PAST MEDICAL HISTORY:  Significant for:  1. Diabetes mellitus, type 2, with associated diabetic retinopathy and     nephropathy, including previous amputation of his great toe.  2. History of hypertension.  3. History of dyslipidemia.  4. Chronic renal insufficiency with microalbuminuria.  5. History of peripheral vascular disease and venous insufficiency.  6. History of gout.  7. History of nephrolithiasis.   PAST  SURGICAL HISTORY:  1. Surgery for rectal abscess.  2. Previous hernia surgery x 2.   ALLERGIES:  He has no known allergies.   CURRENT MEDICATIONS:  1. Lasix 40 mg per day.  2. Tricor 160 mg per day.  3. Norvasc 10 mg per day.  4. Allopurinol 300 mg per day.  5. Lipitor 20 mg per day.  6. Coated aspirin 325 mg daily.  7. Accupril 20 mg per day.  8. Potassium 20 mEq daily.  9. Lopressor 50 mg per day.  10.      Amaryl 4 mg daily.  11.      Actos 30 mg per day.   SOCIAL HISTORY:  The patient is retired.  He is married.  He has two  children.  He denies tobacco use.   FAMILY HISTORY:  His father died at age 81 with myocardial infarction.  He  has  multiple family members with hypertension.   REVIEW OF SYSTEMS:  He has noticed a 30-pound weight gain over the past two  years.  He has chronic numbness of his feet.  He is fairly sedentary.  Otherwise no new medical complaints.  Review of systems otherwise negative.   PHYSICAL EXAMINATION:  GENERAL APPEARANCE:  The patient is an obese black  male in no apparent distress.  VITAL SIGNS:  He is afebrile.  Pulse is 70, respirations 20, and blood  pressure 130/80.  HEENT:  His pupils are equal, round, and reactive.  His conjunctivae are  without erythema.  His oropharynx is clear.  NECK:  Without JVD, adenopathy, thyromegaly, or bruits.  LUNGS:  Clear.  CARDIAC:  Regular rate and rhythm without murmur, rub, gallop, or click.  ABDOMEN:  Obese, soft, and nontender without masses or hepatosplenomegaly.  EXTREMITIES:  His femoral pulses are 2+ and symmetric.  He has absent pedal  pulses.  He has had a previous amputation of the toe.  SKIN:  Warm and dry.  NEUROLOGIC:  Significant for peripheral numbness, otherwise no focal  findings.   LABORATORY DATA:  Chest x-ray shows cardiomegaly and no active disease.  His  coagulation times are normal.  His white count is 5500, hemoglobin 12.1,  hematocrit 37, and platelets 210.  The sodium is 143,  potassium 4.6,  chloride 104, CO2 29, BUN 13, creatinine 0.9, and glucose 97.  His ECG at  rest shows normal sinus rhythm with poor R-wave progression in the anterior  precordial leads, otherwise normal.   IMPRESSION:  1. Abnormal stress Cardiolite study with diminished exercise capacity in a     patient who is otherwise asymptomatic.  2. Status post coronary artery bypass graft in 1998.  3. Diabetes mellitus, type 2.  4. Dyslipidemia.  5. Hypertension.  6. Peripheral neuropathy.  7. Peripheral vascular disease.  8. Chronic renal insufficiency.   PLAN:  The patient will undergo diagnostic cardiac catheterization.  Given  his history of chronic renal insufficiency, will hold his Lasix the day  before and the day of the test and also administer Mucomyst therapy.  Will  try and limit dye load as much as possible.                                                Peter M. Swaziland, M.D.    PMJ/MEDQ  D:  04/15/2003  T:  04/16/2003  Job:  [id number]   cc:   Veverly Fells. Altheimer, M.D.  [address]  [place]  [place] [address]  Fax: [phone]

## 2010-05-26 NOTE — H&P (Signed)
NAME:  FONTAINE, HEHL NO.:  [id number]   MEDICAL RECORD NO.:  [id number]          PATIENT TYPE:  EMS   LOCATION:  ED                           FACILITY:  [facility]   PHYSICIAN:  Ulyses Amor, MD DATE OF BIRTH:  03/02/1931   DATE OF ADMISSION:  08/01/2005  DATE OF DISCHARGE:                                HISTORY & PHYSICAL   HISTORY OF PRESENT ILLNESS:  Alan [name] is a 77 year old black man who  was admitted for observation to [facility] because of chronic lower  extremity edema.  The patient actually has chronic lower extremity edema.  He has noted in the last 10 days a gradual increase in his lower extremity  edema.  He presented to the emergency department this evening.  He has not  noted any dyspnea, nor has he noted any chest pain, tightness, heaviness,  pressure or squeezing.  He notes no diaphoresis or nausea.  He has been  taking his medications as prescribed, and he has not deviated from his usual  diet.  He has no history of congestive heart failure.   PAST MEDICAL HISTORY:  1.  The patient has a history of coronary artery disease.  He has previously      undergone coronary artery bypass surgery.  There was no history of      arrhythmia.  2.  Hypertension.  3.  Dyslipidemia.  4.  Diabetes mellitus.  5.  Gout.  6.  Prostate cancer.   MEDICATIONS:  1.  Allopurinol.  2.  Furosemide.  3.  Gemfibrozil.  4.  Glipizide.  5.  Metformin.  6.  Metoprolol.  7.  Norvasc.  8.  Quinapril.  9.  Simvastatin.  10. Aspirin.   ALLERGIES:  None.   OPERATIONS:  1.  Coronary artery bypass surgery.  2.  Toe amputation.   SOCIAL HISTORY:  The patient lives with his wife.  He does not work.  He  does not smoke cigarettes.  He drinks occasional alcohol.   FAMILY HISTORY:  Notable for coronary artery disease.   REVIEW OF SYSTEMS:  No new problems related to his head, eyes, ears, nose,  mouth, throat, lungs, gastrointestinal system,  genitourinary system, or  extremities.  There is no history of neurologic or psychiatric disorder.  There is no history of fever, chills, or weight loss.  The patient notes  rare, less than 5-second episodes of left hand tingling in recent months.   PHYSICAL EXAMINATION:  VITAL SIGNS:  Blood pressure 140/73, pulse 77 and  regular, respirations 20, temperature 97.7.  GENERAL:  The patient was an elderly black man in no discomfort.  He was  alert, oriented, appropriate and responsive.  HEENT:  Head, eyes, nose and mouth were normal.  NECK:  Without thyromegaly or adenopathy.  Carotid pulses were palpable  bilaterally and without bruits.  CARDIAC:  A normal S1 and S2.  There was no S3, S4, rub or click.  There was  a soft systolic ejection murmur heard at the left sternal border.  Cardiac  rhythm was regular.  No chest wall tenderness was noted.  LUNGS:  Clear.  ABDOMEN:  Soft and nontender.  There was no mass, hepatosplenomegaly, bruit,  distention, rebound, guarding or rigidity.  Bowel sounds were normal.  RECTAL/GENITAL:  Not performed, as they were not pertinent to the reason for  acute care hospitalization.  EXTREMITIES:  No deviation or deformity.  There was marked lower extremity  edema bilaterally.  Radial and dorsalis pedis pulses were palpable  bilaterally.  Brief screening neurologic survey was unremarkable.   The electrocardiogram revealed normal sinus rhythm.  The possibility of a  prior septal myocardial infarction could not be excluded.  Nonspecific T  wave flattening was noted.  QRS voltages were low.  A chest radiograph had  not yet been performed at the time of this dictation.  BNP was 91.6.  The  initial set of cardiac markers revealed a myoglobin of 106, CK-MB 1.8, and  troponin less than 0.05.  Potassium was 4.3, BUN 24, and creatinine 1.5.  White count was 7.9 with a hemoglobin of 13.1 and hematocrit of 39.0.  The  remaining studies were pending at the time of this  dictation.   IMPRESSION:  1.  Chronic lower extremity edema, increased in the last 10 days.  There is      no evidence to suggest congestive heart failure at this time (although      the chest radiograph is still pending), including no dyspnea, no      tachypnea, no hypoxia, clear lungs, and BNP of 92, and no prior history      of congestive heart failure.  2.  Coronary artery disease; status post coronary artery bypass surgery.  No      chest pain, tightness, heaviness, pressure or squeezing.  The patient      notes rare (less than 5 second) episodes of left hand tingling in recent      months.  3.  Hypertension.  4.  Dyslipidemia.  5.  Diabetes mellitus.  6.  Gout.  7.  Prostate cancer.   PLAN:  1.  Telemetry.  2.  Serial cardiac enzymes.  3.  Elevate legs.  4.  Furosemide.  5.  Further measures per Dr. Swaziland.      Ulyses Amor, MD  Electronically Signed     MSC/MEDQ  D:  08/01/2005  T:  08/01/2005  Job:  [id number]   cc:   Peter M. Swaziland, M.D.  Fax: [phone]

## 2010-05-29 ENCOUNTER — Telehealth: Payer: Self-pay | Admitting: Cardiology

## 2010-05-29 ENCOUNTER — Encounter (HOSPITAL_COMMUNITY)
Admission: RE | Admit: 2010-05-29 | Discharge: 2010-05-29 | Disposition: A | Discharge status: Home / Self Care | Payer: Medicare Other | Source: Ambulatory Visit | Attending: Vascular Surgery | Admitting: Vascular Surgery

## 2010-05-29 LAB — SURGICAL PCR SCREEN: MRSA, PCR: NEGATIVE

## 2010-05-29 NOTE — Telephone Encounter (Signed)
Fax: [phone] Last OV

## 2010-06-01 ENCOUNTER — Ambulatory Visit (HOSPITAL_COMMUNITY)
Admission: RE | Admit: 2010-06-01 | Discharge: 2010-06-01 | Disposition: A | Discharge status: Home / Self Care | Payer: Medicare Other | Source: Ambulatory Visit | Attending: Vascular Surgery | Admitting: Vascular Surgery

## 2010-06-01 DIAGNOSIS — I129 Hypertensive chronic kidney disease with stage 1 through stage 4 chronic kidney disease, or unspecified chronic kidney disease: Secondary | ICD-10-CM | POA: Insufficient documentation

## 2010-06-01 DIAGNOSIS — I120 Hypertensive chronic kidney disease with stage 5 chronic kidney disease or end stage renal disease: Secondary | ICD-10-CM

## 2010-06-01 DIAGNOSIS — I2510 Atherosclerotic heart disease of native coronary artery without angina pectoris: Secondary | ICD-10-CM | POA: Insufficient documentation

## 2010-06-01 DIAGNOSIS — Z01812 Encounter for preprocedural laboratory examination: Secondary | ICD-10-CM | POA: Insufficient documentation

## 2010-06-01 DIAGNOSIS — N186 End stage renal disease: Secondary | ICD-10-CM

## 2010-06-01 DIAGNOSIS — N189 Chronic kidney disease, unspecified: Secondary | ICD-10-CM | POA: Insufficient documentation

## 2010-06-01 DIAGNOSIS — E119 Type 2 diabetes mellitus without complications: Secondary | ICD-10-CM | POA: Insufficient documentation

## 2010-06-01 LAB — POCT I-STAT 4, (NA,K, GLUC, HGB,HCT)
Hemoglobin: 13.9 g/dL (ref 13.0–17.0)
Sodium: 142 mEq/L (ref 135–145)

## 2010-06-27 ENCOUNTER — Ambulatory Visit (INDEPENDENT_AMBULATORY_CARE_PROVIDER_SITE_OTHER): Payer: Medicare Other | Admitting: Vascular Surgery

## 2010-06-27 DIAGNOSIS — N186 End stage renal disease: Secondary | ICD-10-CM

## 2010-06-27 NOTE — Op Note (Signed)
  [name], Alan [name]            ACCOUNT NO.:  [id number]  MEDICAL RECORD NO.:  [id number]           PATIENT TYPE:  O  LOCATION:  SDSC                         FACILITY:  [facility]  PHYSICIAN:  Larina Earthly, M.D.    DATE OF BIRTH:  [birth year]  DATE OF PROCEDURE:  06/01/2010 DATE OF DISCHARGE:  06/01/2010                              OPERATIVE REPORT   PREOPERATIVE DIAGNOSIS:  Chronic renal insufficiency.  POSTOPERATIVE DIAGNOSIS:  Chronic renal insufficiency.  PROCEDURE:  Right upper arm arteriovenous fistula creation.  SURGEON:  Larina Earthly, MD  ASSISTANT:  Pecola Leisure, PA-C  ANESTHESIA:  MAC.  COMPLICATIONS:  None.  DISPOSITION:  To recovery room, stable.  PROCEDURE IN DETAIL:  The patient was taken to the operating room and placed in supine position where the right arm was prepped and draped in the usual sterile fashion.  Ultrasound was used to visualize the cephalic vein which was of moderate caliber at the antecubital space. The patient did have a brachial pulse, but no radial or ulnar pulse. The patient had extensively calcified vessels by prior ultrasound. Using local anesthesia, an incision was made over the antecubital space and carried down to isolate the cephalic vein which was of moderate caliber.  Tributary branches were ligated with 3-0 and 4-0 silk ties and divided.  The vein was mobilized proximally and distally and was ligated distally and divided distally.  The vein was gently dilated and it was felt to be adequate for fistula creation.  The brachial artery was exposed through the same incision.  The artery was very calcified, but did have a pulse.  The artery was occluded proximally and distally and was opened with an #11 blade and extended longitudinally with Potts scissors.  A small arteriotomy was created.  The vein was brought into approximation with the artery and was divided and was spatulated and sewn end to side to the artery with a  running 6-0 Prolene suture. Clamps were removed and excellent thrill was noted.  The wound was irrigated with saline.  Hemostasis was achieved with electrocautery. Wounds were closed with 3-0 Vicryl on the subcutaneous and subcuticular tissue.  Benzoin and Steri-Strips were applied.     Larina Earthly, M.D.     TFE/MEDQ  D:  06/01/2010  T:  06/02/2010  Job:  [id number]  Electronically Signed by Jabir Dahlem M.D. on 06/27/2010 01:10:12 PM

## 2010-06-28 NOTE — Assessment & Plan Note (Signed)
OFFICE VISIT  Alan [name], Alan [name] DOB:  1931-05-26                                       06/27/2010 ZOXWR#:[id number]  The patient presents today for follow-up of his right upper arm AV fistula created by myself on Jun 01, 2010.  His incision is healed completely and he has no discomfort related to this.  He has excellent Zadin Lange maturation with a very superficial vein.  I am quite pleased with his result with a very nice thrill and an enlarging superficial vein.  I feel that he has a very good chance this will be accessible to access if and when the time comes for dialysis.  He will continue his follow-up with Dr. Kathrene Bongo and will see [place] again on an as-needed basis.  He will continue his exercise of the right arm to enlarge his right upper arm AV fistula.    Larina Earthly, M.D. Electronically Signed  TFE/MEDQ  D:  06/27/2010  T:  06/28/2010  Job:  [id number]  cc:   Cecille Aver, M.D.

## 2010-10-26 ENCOUNTER — Inpatient Hospital Stay (HOSPITAL_COMMUNITY)
Admission: EM | Admit: 2010-10-26 | Discharge: 2010-11-02 | DRG: 682 | Disposition: A | Discharge status: Home Health | Payer: MEDICARE | Attending: Internal Medicine | Admitting: Internal Medicine

## 2010-10-26 ENCOUNTER — Emergency Department (HOSPITAL_COMMUNITY): Payer: MEDICARE

## 2010-10-26 ENCOUNTER — Inpatient Hospital Stay (HOSPITAL_COMMUNITY): Payer: MEDICARE

## 2010-10-26 DIAGNOSIS — Z992 Dependence on renal dialysis: Secondary | ICD-10-CM

## 2010-10-26 DIAGNOSIS — N179 Acute kidney failure, unspecified: Secondary | ICD-10-CM | POA: Diagnosis present

## 2010-10-26 DIAGNOSIS — E119 Type 2 diabetes mellitus without complications: Secondary | ICD-10-CM | POA: Diagnosis present

## 2010-10-26 DIAGNOSIS — I2510 Atherosclerotic heart disease of native coronary artery without angina pectoris: Secondary | ICD-10-CM | POA: Diagnosis present

## 2010-10-26 DIAGNOSIS — Z8546 Personal history of malignant neoplasm of prostate: Secondary | ICD-10-CM

## 2010-10-26 DIAGNOSIS — E669 Obesity, unspecified: Secondary | ICD-10-CM | POA: Diagnosis present

## 2010-10-26 DIAGNOSIS — E785 Hyperlipidemia, unspecified: Secondary | ICD-10-CM | POA: Diagnosis present

## 2010-10-26 DIAGNOSIS — Z79899 Other long term (current) drug therapy: Secondary | ICD-10-CM

## 2010-10-26 DIAGNOSIS — D631 Anemia in chronic kidney disease: Secondary | ICD-10-CM | POA: Diagnosis present

## 2010-10-26 DIAGNOSIS — N039 Chronic nephritic syndrome with unspecified morphologic changes: Secondary | ICD-10-CM | POA: Diagnosis present

## 2010-10-26 DIAGNOSIS — I359 Nonrheumatic aortic valve disorder, unspecified: Secondary | ICD-10-CM | POA: Diagnosis present

## 2010-10-26 DIAGNOSIS — E871 Hypo-osmolality and hyponatremia: Secondary | ICD-10-CM | POA: Diagnosis present

## 2010-10-26 DIAGNOSIS — J9600 Acute respiratory failure, unspecified whether with hypoxia or hypercapnia: Secondary | ICD-10-CM | POA: Diagnosis present

## 2010-10-26 DIAGNOSIS — I498 Other specified cardiac arrhythmias: Secondary | ICD-10-CM | POA: Diagnosis present

## 2010-10-26 DIAGNOSIS — M109 Gout, unspecified: Secondary | ICD-10-CM | POA: Diagnosis present

## 2010-10-26 DIAGNOSIS — E875 Hyperkalemia: Secondary | ICD-10-CM | POA: Diagnosis present

## 2010-10-26 DIAGNOSIS — N186 End stage renal disease: Secondary | ICD-10-CM | POA: Diagnosis present

## 2010-10-26 DIAGNOSIS — Z7901 Long term (current) use of anticoagulants: Secondary | ICD-10-CM

## 2010-10-26 DIAGNOSIS — Z8249 Family history of ischemic heart disease and other diseases of the circulatory system: Secondary | ICD-10-CM

## 2010-10-26 DIAGNOSIS — R5381 Other malaise: Secondary | ICD-10-CM | POA: Diagnosis present

## 2010-10-26 DIAGNOSIS — Z951 Presence of aortocoronary bypass graft: Secondary | ICD-10-CM

## 2010-10-26 DIAGNOSIS — Z6835 Body mass index (BMI) 35.0-35.9, adult: Secondary | ICD-10-CM

## 2010-10-26 DIAGNOSIS — I509 Heart failure, unspecified: Secondary | ICD-10-CM | POA: Diagnosis present

## 2010-10-26 DIAGNOSIS — Z87442 Personal history of urinary calculi: Secondary | ICD-10-CM

## 2010-10-26 DIAGNOSIS — I4891 Unspecified atrial fibrillation: Secondary | ICD-10-CM | POA: Diagnosis present

## 2010-10-26 DIAGNOSIS — I120 Hypertensive chronic kidney disease with stage 5 chronic kidney disease or end stage renal disease: Principal | ICD-10-CM | POA: Diagnosis present

## 2010-10-26 LAB — BASIC METABOLIC PANEL WITH GFR
Calcium: 10.1 mg/dL (ref 8.4–10.5)
Creatinine, Ser: 8.49 mg/dL — ABNORMAL HIGH (ref 0.50–1.35)
GFR calc non Af Amer: 5 mL/min — ABNORMAL LOW (ref >90)
Sodium: 128 meq/L — ABNORMAL LOW (ref 135–145)

## 2010-10-26 LAB — BASIC METABOLIC PANEL
BUN: 110 mg/dL — ABNORMAL HIGH (ref 6–23)
CO2: 25 mEq/L (ref 19–32)
Chloride: 90 mEq/L — ABNORMAL LOW (ref 96–112)
GFR calc Af Amer: 6 mL/min — ABNORMAL LOW (ref >90)
Glucose, Bld: 263 mg/dL — ABNORMAL HIGH (ref 70–99)
Potassium: >7.5 mEq/L (ref 3.5–5.1)

## 2010-10-26 LAB — DIFFERENTIAL
Basophils Absolute: 0 K/uL (ref 0.0–0.1)
Basophils Relative: 0 % (ref 0–1)
Eosinophils Absolute: 0 10*3/uL (ref 0.0–0.7)
Eosinophils Relative: 0 % (ref 0–5)
Lymphocytes Relative: 10 % — ABNORMAL LOW (ref 12–46)
Lymphs Abs: 1.1 10*3/uL (ref 0.7–4.0)
Monocytes Absolute: 0.5 K/uL (ref 0.1–1.0)
Monocytes Relative: 4 % (ref 3–12)
Neutro Abs: 9.4 K/uL — ABNORMAL HIGH (ref 1.7–7.7)
Neutrophils Relative %: 86 % — ABNORMAL HIGH (ref 43–77)

## 2010-10-26 LAB — POCT I-STAT 4, (NA,K, GLUC, HGB,HCT)
Glucose, Bld: 270 mg/dL — ABNORMAL HIGH (ref 70–99)
HCT: 42 % (ref 39.0–52.0)
Hemoglobin: 14.3 g/dL (ref 13.0–17.0)
Potassium: 7.8 meq/L (ref 3.5–5.1)
Sodium: 130 meq/L — ABNORMAL LOW (ref 135–145)

## 2010-10-26 LAB — CBC
HCT: 41.5 % (ref 39.0–52.0)
Hemoglobin: 14 g/dL (ref 13.0–17.0)
MCH: 31 pg (ref 26.0–34.0)
MCHC: 33.7 g/dL (ref 30.0–36.0)
MCV: 92 fL (ref 78.0–100.0)
Platelets: 278 10*3/uL (ref 150–400)
RBC: 4.51 MIL/uL (ref 4.22–5.81)
RDW: 15 % (ref 11.5–15.5)
WBC: 10.9 K/uL — ABNORMAL HIGH (ref 4.0–10.5)

## 2010-10-26 LAB — APTT: aPTT: 29 s (ref 24–37)

## 2010-10-26 LAB — ALT: ALT: 10 U/L (ref 0–53)

## 2010-10-26 LAB — POCT I-STAT TROPONIN I: Troponin i, poc: 0.02 ng/mL (ref 0.00–0.08)

## 2010-10-26 LAB — PROTIME-INR
INR: 1.02 (ref 0.00–1.49)
Prothrombin Time: 13.6 s (ref 11.6–15.2)

## 2010-10-26 NOTE — H&P (Signed)
NAMEWOLFE, CAMARENA NO.:  [id number]  MEDICAL RECORD NO.:  [id number]  LOCATION:  [facility]                         FACILITY:  [facility]  PHYSICIAN:  Andreas Blower, MD       DATE OF BIRTH:  January 20, 1931  DATE OF ADMISSION:  10/26/2010 DATE OF DISCHARGE:                             HISTORY & PHYSICAL   PRIMARY CARE PHYSICIAN:  Veverly Fells. Altheimer, MD  PRIMARY NEPHROLOGIST:  Cecille Aver, MD  PRIMARY CARDIOLOGIST:  Peter M. Swaziland, MD  CHIEF COMPLAINT:  General malaise.  HISTORY OF PRESENT ILLNESS:  [name] is a 77 year old African American male with history of coronary artery disease, status post CABG, diabetes, hypertension, history of gout, chronic kidney disease stage IV, who presents with the above complaint.  He reports that he had been in his usual state of health; however, over the last few days, he has been feeling increasingly weak and increasingly fatigued with general malaise.  As a result, presented to the ER for further evaluation.  In the ER, the patient had a laboratory work done, which indicated that his potassium was 7.8, and creatinine was 8.49 which his creatinine was worse from baseline of approximately a 3.2.  The patient also had some EKG changes with peaked T-waves.  As a result, the Hospitalist Service was consulted to admit the patient for further evaluation.  The patient was emergently transferred to dialysis after being given temporizing measures for hyperkalemia, from the ER.  The patient denies any recent fevers, chills.  Denies any chest pain, shortness of breath. Does feel tired but feels like his strength is improved.  Denies any chest pain.  Denies any abdominal pain, diarrhea, headaches, or vision changes.  REVIEW OF SYSTEMS:  All systems were reviewed with the patient, was positive as per HPI, otherwise all other systems are negative.  PAST MEDICAL HISTORY: 1. History of prostate cancer. 2. History of anemia due  to chronic kidney disease; however,     hemoglobin is normal at this time. 3. History of coronary artery disease, status post CABG. 4. History of chronic kidney disease, stage IV. 5. Hypertension. 6. History of gout. 7. Type 2 diabetes. 8. Hyperlipidemia. 9. History of ventral hernia. 10.Right renal calculi with history of percutaneous nephrostomy.  SOCIAL HISTORY:  The patient is married, does not smoke, lives at home, drinks alcohol occasionally.  FAMILY HISTORY:  Significant for father, who died at the age of 16 from MI.  HOME MEDICATIONS:  Pending accurate reconciliation from pharmacy. Currently, the patient is taking: 1. Januvia 25 mg p.o. daily. 2. Allopurinol 150 mg p.o. daily. 3. Zemplar 1 mcg p.o. daily. 4. Vitamin D 50,000 units p.o. q. week. 5. Aspirin 325 mg p.o. daily. 6. Magnesium oxide 1 tablet p.o. daily. 7. Potassium chloride 20 mEq 2 tablets p.o. twice daily. 8. Furosemide 160 mg p.o. twice daily. 9. Zaroxolyn 2.5 mg p.o. daily. 10.Diltiazem 240 mg p.o. once a day. 11.Metoprolol 25 mg p.o. twice daily. 12.Vytorin 10/40 one tablet p.o. daily. 13.Glimepiride 2 mg p.o. daily. 14.Estazolam 1 mg p.o. daily at bedtime as needed.  PHYSICAL EXAMINATION:  VITAL SIGNS:  Temperature is afebrile, blood pressure 117/83, heart rate currently 50, respirations  22, satting at 96% on few L of oxygen. GENERAL:  The patient was alert and oriented, not appear to be in acute distress, was slow to responses, was lying in bed comfortably. HEENT:  Extraocular motions are intact.  Pupils are equal and round. Moist mucous membranes.  NECK:  Supple. HEART:  Regular with S1 and S2. LUNGS:  Some scattered crackles at bases. ABDOMEN:  Soft, nontender, nondistended.  Positive bowel sounds. EXTREMITIES:  The patient has good peripheral pulses with trace edema. NEUROLOGIC: Cranial nerves II through XII grossly intact, had 5/5 motor strength in upper as well as lower  extremities.  RADIOLOGY/IMAGING:  The patient had a chest x-ray, which showed suspect moderate CHF, layering effusions.  EKG which shows peaked T-waves in the precordial leads with widened QRS complex, which is vastly different from an EKG from April 04, 2010, which showed sinus bradycardia.  LABORATORY DATA:  CBC shows a white count of 10.9, hemoglobin 14.3, hematocrit 42.0, platelet count 278.  Sodium 130, potassium of 7.8, chloride 90, CO2 of 25, BUN 110, and creatinine 8.49.  ASSESSMENT AND PLAN: 1. Hyperkalemia in the setting of acute renal failure on chronic     kidney disease, stage IV.  The patient is on furosemide, Zaroxolyn,     and potassium chloride supplements, which will all be discontinued.     Given EKG changes, the patient was given temporizing measures in     the ER, which included albuterol with calcium gluconate with D50.     Currently, the patient is being dialyzed.  We will continue to     check the potassium 4 hours after dialysis, to allow for time     for electrolytes to equilibrate.  We will have the     patient be admitted to step down given the significant     hyperkalemia. 2. General malaise and weakness likely due to hyperkalemia in the     setting of acute renal failure on chronic kidney disease, stage IV.     We will have PT evaluate the patient. 3. Acute renal failure on chronic kidney disease, stage IV.  Renal is     on board and is performing dialysis at this time.  Further     management as per renal.  Renal to determine whether the     patient will need long term dialysis at the time of discharge. 4. Hyponatremia likely due to acute renal failure on chronic kidney     disease, stage IV.  Monitor for now. 5. Bradycardia.  The patient has a history of chronic bradycardia.     The patient is currently on diltiazem and metoprolol, which will be     held at this time, reassess in the morning.  If heart rate     improves, could consider resuming  metoprolol and diltiazem at lower     doses. 6. Type 2 diabetes.  Continue home medications.  We will have the     patient on sensitive sliding-scale insulin.  Resume home p.o.     medications depending on what the blood sugars are in the morning.     Blood sugar elevated likely because patient was D50 in the ER     for hyperkalemia. 7. Hyperlipidemia.  Continue the patient on Vytorin. 8. History of gout.  Continue allopurinol. 9. Prophylaxis.  Subcutaneous heparin for deep venous thrombosis     prophylaxis.  CODE STATUS:  The patient is full code.  This was discussed with the  patient at the time of admission.  Time spent on admission talking to the patient, consultants, and coordinating care was 1 hour.     Andreas Blower, MD     SR/MEDQ  D:  10/26/2010  T:  10/26/2010  Job:  [id number]  Electronically Signed by Wardell Heath Ludie Hudon  on 10/26/2010 11:30:33 PM

## 2010-10-27 ENCOUNTER — Inpatient Hospital Stay (HOSPITAL_COMMUNITY): Payer: MEDICARE

## 2010-10-27 DIAGNOSIS — I359 Nonrheumatic aortic valve disorder, unspecified: Secondary | ICD-10-CM

## 2010-10-27 LAB — BASIC METABOLIC PANEL
BUN: 93 mg/dL — ABNORMAL HIGH (ref 6–23)
BUN: 67 mg/dL — ABNORMAL HIGH (ref 6–23)
BUN: 67 mg/dL — ABNORMAL HIGH (ref 6–23)
CO2: 27 mEq/L (ref 19–32)
Calcium: 9.9 mg/dL (ref 8.4–10.5)
Chloride: 94 mEq/L — ABNORMAL LOW (ref 96–112)
Creatinine, Ser: 7.94 mg/dL — ABNORMAL HIGH (ref 0.50–1.35)
Creatinine, Ser: 6.66 mg/dL — ABNORMAL HIGH (ref 0.50–1.35)
Creatinine, Ser: 7.17 mg/dL — ABNORMAL HIGH (ref 0.50–1.35)
GFR calc Af Amer: 7 mL/min — ABNORMAL LOW (ref >90)
GFR calc Af Amer: 8 mL/min — ABNORMAL LOW (ref >90)
GFR calc Af Amer: 8 mL/min — ABNORMAL LOW (ref >90)
GFR calc non Af Amer: 7 mL/min — ABNORMAL LOW (ref >90)
GFR calc non Af Amer: 7 mL/min — ABNORMAL LOW (ref >90)
Potassium: 5.4 mEq/L — ABNORMAL HIGH (ref 3.5–5.1)

## 2010-10-27 LAB — GLUCOSE, CAPILLARY: Glucose-Capillary: 157 mg/dL — ABNORMAL HIGH (ref 70–99)

## 2010-10-27 LAB — CBC
MCH: 30.1 pg (ref 26.0–34.0)
MCV: 92.3 fL (ref 78.0–100.0)
Platelets: 250 10*3/uL (ref 150–400)
RBC: 4.05 MIL/uL — ABNORMAL LOW (ref 4.22–5.81)

## 2010-10-27 LAB — RENAL FUNCTION PANEL
CO2: 25 mEq/L (ref 19–32)
Calcium: 9.5 mg/dL (ref 8.4–10.5)
Creatinine, Ser: 8.04 mg/dL — ABNORMAL HIGH (ref 0.50–1.35)
GFR calc non Af Amer: 6 mL/min — ABNORMAL LOW (ref >90)

## 2010-10-27 LAB — POCT I-STAT 4, (NA,K, GLUC, HGB,HCT)
HCT: 40 % (ref 39.0–52.0)
Hemoglobin: 13.6 g/dL (ref 13.0–17.0)

## 2010-10-27 LAB — HEPATITIS B SURFACE ANTIGEN: Hepatitis B Surface Ag: NEGATIVE

## 2010-10-27 LAB — HEPATITIS B CORE ANTIBODY, TOTAL: Hep B Core Total Ab: NEGATIVE

## 2010-10-27 LAB — HEMOGLOBIN A1C: Hgb A1c MFr Bld: 6.7 % — ABNORMAL HIGH (ref <5.7)

## 2010-10-28 ENCOUNTER — Inpatient Hospital Stay (HOSPITAL_COMMUNITY): Payer: MEDICARE

## 2010-10-28 DIAGNOSIS — R5381 Other malaise: Secondary | ICD-10-CM

## 2010-10-28 DIAGNOSIS — R5383 Other fatigue: Secondary | ICD-10-CM

## 2010-10-28 LAB — GLUCOSE, CAPILLARY
Glucose-Capillary: 96 mg/dL (ref 70–99)
Glucose-Capillary: 131 mg/dL — ABNORMAL HIGH (ref 70–99)

## 2010-10-28 LAB — CBC
Platelets: 243 10*3/uL (ref 150–400)
RDW: 15.1 % (ref 11.5–15.5)
WBC: 10.5 10*3/uL (ref 4.0–10.5)

## 2010-10-28 LAB — BASIC METABOLIC PANEL
BUN: 67 mg/dL — ABNORMAL HIGH (ref 6–23)
CO2: 30 mEq/L (ref 19–32)
Calcium: 9.2 mg/dL (ref 8.4–10.5)
Chloride: 97 mEq/L (ref 96–112)
Creatinine, Ser: 7.37 mg/dL — ABNORMAL HIGH (ref 0.50–1.35)
Creatinine, Ser: 7.86 mg/dL — ABNORMAL HIGH (ref 0.50–1.35)
GFR calc Af Amer: 7 mL/min — ABNORMAL LOW (ref >90)

## 2010-10-28 LAB — RENAL FUNCTION PANEL
Albumin: 2.8 g/dL — ABNORMAL LOW (ref 3.5–5.2)
Chloride: 96 mEq/L (ref 96–112)
GFR calc non Af Amer: 6 mL/min — ABNORMAL LOW (ref >90)
Potassium: 5.1 mEq/L (ref 3.5–5.1)

## 2010-10-29 ENCOUNTER — Inpatient Hospital Stay (HOSPITAL_COMMUNITY): Payer: MEDICARE

## 2010-10-29 LAB — CBC
Hemoglobin: 10.1 g/dL — ABNORMAL LOW (ref 13.0–17.0)
MCH: 30.3 pg (ref 26.0–34.0)
RBC: 3.33 MIL/uL — ABNORMAL LOW (ref 4.22–5.81)
WBC: 8.2 10*3/uL (ref 4.0–10.5)

## 2010-10-29 LAB — COMPREHENSIVE METABOLIC PANEL
ALT: 17 U/L (ref 0–53)
Alkaline Phosphatase: 49 U/L (ref 39–117)
CO2: 30 mEq/L (ref 19–32)
Calcium: 8.8 mg/dL (ref 8.4–10.5)
Chloride: 95 mEq/L — ABNORMAL LOW (ref 96–112)
GFR calc Af Amer: 7 mL/min — ABNORMAL LOW (ref >90)
GFR calc non Af Amer: 6 mL/min — ABNORMAL LOW (ref >90)
Glucose, Bld: 71 mg/dL (ref 70–99)
Potassium: 3.7 mEq/L (ref 3.5–5.1)
Sodium: 135 mEq/L (ref 135–145)
Total Bilirubin: 0.4 mg/dL (ref 0.3–1.2)

## 2010-10-30 DIAGNOSIS — M79609 Pain in unspecified limb: Secondary | ICD-10-CM

## 2010-10-30 DIAGNOSIS — M7989 Other specified soft tissue disorders: Secondary | ICD-10-CM

## 2010-10-30 DIAGNOSIS — I359 Nonrheumatic aortic valve disorder, unspecified: Secondary | ICD-10-CM

## 2010-10-30 LAB — PTH, INTACT AND CALCIUM
Calcium, Total (PTH): 8.8 mg/dL (ref 8.4–10.5)
PTH: 80.2 pg/mL — ABNORMAL HIGH (ref 14.0–72.0)

## 2010-10-30 LAB — CBC
HCT: 25.1 % — ABNORMAL LOW (ref 39.0–52.0)
Hemoglobin: 8.3 g/dL — ABNORMAL LOW (ref 13.0–17.0)
MCH: 30 pg (ref 26.0–34.0)
MCHC: 33.1 g/dL (ref 30.0–36.0)
MCV: 90.6 fL (ref 78.0–100.0)
Platelets: 231 10*3/uL (ref 150–400)
RBC: 2.77 MIL/uL — ABNORMAL LOW (ref 4.22–5.81)
RBC: 3.7 MIL/uL — ABNORMAL LOW (ref 4.22–5.81)
RDW: 15 % (ref 11.5–15.5)
WBC: 9.4 10*3/uL (ref 4.0–10.5)

## 2010-10-30 LAB — GLUCOSE, CAPILLARY: Glucose-Capillary: 105 mg/dL — ABNORMAL HIGH (ref 70–99)

## 2010-10-30 LAB — BASIC METABOLIC PANEL
CO2: 31 mEq/L (ref 19–32)
Calcium: 9.3 mg/dL (ref 8.4–10.5)
Chloride: 97 mEq/L (ref 96–112)
GFR calc Af Amer: 8 mL/min — ABNORMAL LOW (ref >90)
Sodium: 138 mEq/L (ref 135–145)

## 2010-10-31 LAB — BASIC METABOLIC PANEL
BUN: 48 mg/dL — ABNORMAL HIGH (ref 6–23)
Calcium: 9 mg/dL (ref 8.4–10.5)
Chloride: 96 mEq/L (ref 96–112)
Creatinine, Ser: 7.29 mg/dL — ABNORMAL HIGH (ref 0.50–1.35)
GFR calc Af Amer: 7 mL/min — ABNORMAL LOW (ref >90)
GFR calc non Af Amer: 6 mL/min — ABNORMAL LOW (ref >90)

## 2010-10-31 LAB — GLUCOSE, CAPILLARY
Glucose-Capillary: 160 mg/dL — ABNORMAL HIGH (ref 70–99)
Glucose-Capillary: 105 mg/dL — ABNORMAL HIGH (ref 70–99)

## 2010-10-31 LAB — CBC
HCT: 34.7 % — ABNORMAL LOW (ref 39.0–52.0)
MCHC: 32.9 g/dL (ref 30.0–36.0)
MCV: 92 fL (ref 78.0–100.0)
Platelets: 248 10*3/uL (ref 150–400)
RDW: 15.1 % (ref 11.5–15.5)
WBC: 8.5 10*3/uL (ref 4.0–10.5)

## 2010-10-31 LAB — HEPARIN LEVEL (UNFRACTIONATED): Heparin Unfractionated: 0.22 IU/mL — ABNORMAL LOW (ref 0.30–0.70)

## 2010-11-01 ENCOUNTER — Inpatient Hospital Stay (HOSPITAL_COMMUNITY): Payer: MEDICARE

## 2010-11-01 LAB — RENAL FUNCTION PANEL
BUN: 55 mg/dL — ABNORMAL HIGH (ref 6–23)
CO2: 28 mEq/L (ref 19–32)
Chloride: 97 mEq/L (ref 96–112)
GFR calc Af Amer: 7 mL/min — ABNORMAL LOW (ref >90)
Glucose, Bld: 118 mg/dL — ABNORMAL HIGH (ref 70–99)
Phosphorus: 5.1 mg/dL — ABNORMAL HIGH (ref 2.3–4.6)
Potassium: 3.9 mEq/L (ref 3.5–5.1)
Sodium: 136 mEq/L (ref 135–145)

## 2010-11-01 LAB — CBC
HCT: 33.6 % — ABNORMAL LOW (ref 39.0–52.0)
Hemoglobin: 11.1 g/dL — ABNORMAL LOW (ref 13.0–17.0)
MCH: 30.2 pg (ref 26.0–34.0)
MCHC: 33 g/dL (ref 30.0–36.0)
MCV: 91.3 fL (ref 78.0–100.0)
RBC: 3.68 MIL/uL — ABNORMAL LOW (ref 4.22–5.81)

## 2010-11-01 LAB — GLUCOSE, CAPILLARY
Glucose-Capillary: 106 mg/dL — ABNORMAL HIGH (ref 70–99)
Glucose-Capillary: 178 mg/dL — ABNORMAL HIGH (ref 70–99)

## 2010-11-01 LAB — HEPARIN LEVEL (UNFRACTIONATED): Heparin Unfractionated: 0.6 IU/mL (ref 0.30–0.70)

## 2010-11-02 LAB — PROTIME-INR: INR: 1.29 (ref 0.00–1.49)

## 2010-11-02 LAB — CBC
HCT: 34.5 % — ABNORMAL LOW (ref 39.0–52.0)
MCH: 29.9 pg (ref 26.0–34.0)
MCV: 92 fL (ref 78.0–100.0)
Platelets: 266 10*3/uL (ref 150–400)
RDW: 15 % (ref 11.5–15.5)
WBC: 9.2 10*3/uL (ref 4.0–10.5)

## 2010-11-02 LAB — GLUCOSE, CAPILLARY: Glucose-Capillary: 127 mg/dL — ABNORMAL HIGH (ref 70–99)

## 2010-11-02 NOTE — Consult Note (Signed)
NAMETRENELL, CONCANNON NO.:  [id number]  MEDICAL RECORD NO.:  [id number]  LOCATION:  2602                         FACILITY:  [facility]  PHYSICIAN:  Vesta Mixer, M.D. DATE OF BIRTH:  1931-01-21  DATE OF CONSULTATION:  10/28/2010 DATE OF DISCHARGE:                                CONSULTATION   HISTORY OF PRESENT ILLNESS:  Alan [name] is a 77 year old gentleman with a history of coronary artery bypass grafting in 1996.  He has a history of hypertension, diabetes mellitus, and progressive chronic kidney disease.  He is admitted with generalized fatigue and weakness.  He presents to the emergency room and was found have acute renal failure, as well as the potassium is 7.8.  The patient denies any real chest pain or syncope.  He denies any dyspnea.  He has been extremely weak.  CURRENT MEDICATIONS: 1. Allopurinol 100 mg a day. 2. Aspirin 325 mg a day. 3. Trajenta 5 mg a day. 4. Vytorin 10/40 once a day. 5. NovoLog sliding scale. 6. Kayexalate as previously. 7. Ambien as needed.  ALLERGIES:  He has no known drug allergies.  PAST MEDICAL HISTORY: 1. History of coronary artery disease.  He had bypass grafting in     1996.  Cardiac catheterization in 2005 revealed a patent LIMA to     the LAD.  His saphenous vein graft to the diagonal artery was     patent.  The left circumflex native vessel had only minor luminal     irregularities.  The right coronary artery had a proximal 80%     stenosis and the distal vessel was fed via a saphenous vein graft.     There was no aortic valve gradient by cath at that time. 2. Diabetes mellitus. 3. Hypertension. 4. Chronic kidney disease. 5. Hyperlipidemia. 6. He has had a right upper arm dialysis AV fistula placed.  SOCIAL HISTORY:  The patient is a nonsmoker.  He drinks alcohol occasionally.  FAMILY HISTORY:  Positive for coronary artery disease.  REVIEW OF SYSTEMS:  Reviewed in the HPI.  He has had lots of  fatigue and generalized malaise.  Otherwise, his review of systems is negative.  PHYSICAL EXAMINATION:  GENERAL:  He is an elderly gentleman.  He was somewhat lethargic, but was able to answer questions. VITAL SIGNS:  His blood pressure is 117/99.  His heart rate is 105. HEENT:  1+ carotids.  He has no bruits. LUNGS:  Clear. HEART:  Regular rate, S1, S2.  He is tachycardic.  He has a 2-3/6 systolic ejection murmur radiating to the upper right sternal border. ABDOMEN:  He is obese.  He has good bowel sounds.  There is no hepatosplenomegaly.  There is no guarding or tenderness. EXTREMITIES:  He has poor distal pulses.  There is no significant edema. NEUROLOGIC:  Nonfocal.  LABORATORY DATA:  His sodium is 136, potassium is 5.1, chloride is 96, CO2 is 28, BUN 68, creatinine is 7.73.  His white blood cell count is 10.5, hemoglobin is 11.1, hematocrit is 34.3.  IMPRESSION AND PLAN: 1. Aortic stenosis.  The patient had an echocardiogram recently which     reveals moderate-to-severe aortic stenosis.  His  peak aortic valve     gradient was 56 mmHg with a mean aortic valve gradient of 31 mmHg.     He has multiple other medical issues.  I suspect that this aortic     stenosis is contributing some to his complaints.  At this point, it appears that the nephrologists are hoping that his renal function recovers to somewhat.  He has had 2 dialysis runs.  If we proceed with cardiac catheterization which would be necessary prior to an aortic valve surgery, I feel fairly certain that he would have permanent end-stage renal disease and would require dialysis.  I am not quite sure that he is not a permanent end-stage renal disease at this point.  The nephrologists have already placed a dialysis graft in his upper arm in anticipation of needing to have chronic dialysis.  Dr. Swaziland will see the patient on Monday to help with this decision making process.  If it is felt that his symptoms are mainly  or contributed greatly by his aortic stenosis, then we probably should consider aortic valve surgery if he otherwise is at an okay risk.  We will continue to follow along with you.     Vesta Mixer, M.D.     PJN/MEDQ  D:  10/28/2010  T:  10/28/2010  Job:  [id number]  cc:   Veverly Fells. Altheimer, M.D. Cecille Aver, M.D. Peter M. Swaziland, M.D.  Electronically Signed by Kristeen Miss M.D. on 11/02/2010 05:44:26 AM

## 2010-11-05 NOTE — Discharge Summary (Signed)
  NAMEBABY, STAIRS            ACCOUNT NO.:  [id number]  MEDICAL RECORD NO.:  [id number]  LOCATION:  6733                         FACILITY:  [facility]  PHYSICIAN:  Baltazar Najjar, MD     DATE OF BIRTH:  01-14-1931  DATE OF ADMISSION:  10/26/2010 DATE OF DISCHARGE:  11/02/2010                              DISCHARGE SUMMARY   ADDENDUM  The patient will be discharged on Coumadin and Lovenox injections as above in the discharge summary.  INR to be checked on dialysis on Saturday; report to be faxed to his primary care doctor for adjustment of his Coumadin.  This information will be communicated with the renal team.  It was also discussed with the case manager, Eunice Blase, and his nurse to make sure that arrangements will be made.          ______________________________ Baltazar Najjar, MD     SA/MEDQ  D:  11/02/2010  T:  11/02/2010  Job:  [id number]  cc:   Veverly Fells. Altheimer, M.D. [facility]  Electronically Signed by Hannah Beat MD on 11/05/2010 05:21:43 PM

## 2010-11-05 NOTE — Discharge Summary (Signed)
NAMEJAQUE, Alan [name]            ACCOUNT NO.:  [id number]  MEDICAL RECORD NO.:  [id number]  LOCATION:  6733                         FACILITY:  [facility]  PHYSICIAN:  Baltazar Najjar, MD     DATE OF BIRTH:  1931-09-15  DATE OF ADMISSION:  10/26/2010 DATE OF DISCHARGE:                              DISCHARGE SUMMARY   FINAL DISCHARGE DIAGNOSES: 1. Chronic kidney disease stage 5/end-stage renal disease,on hemodialysis. 2. Hyperkalemia secondary to end-stage renal disease, resolved. 3. Atrial fibrillation. 4. Moderate to severe aortic stenosis. 5. Anemia secondary to end-stage renal disease. 6. Hypertension. 7. Diabetes mellitus type 2.   SECONDARY DISCHARGE DIAGNOSES: 1. Hyperlipidemia. 2. History of gout. 3. History of prostate cancer. 4. History of coronary artery disease status post coronary artery     bypass grafting. 5. History of right renal calculi with history of percutaneous     nephrostomy.  CONSULTATIONS DURING THIS HOSPITALIZATION: 1. [facility]. 2. Cardiology, the patient was seen by Dr. Kristeen Miss and Dr. Peter     Swaziland.  RADIOLOGIST/IMAGING STUDIES:  Chest x-ray on October 26, 2010, showed suspected moderate congestive heart failure, layering effusion.  Repeat chest x-ray on October 29, 2010, showed interval improvement, although incomplete clearance of pulmonary edema.  The right lung base opacity remain which may represent residual pulmonary edema, although infiltrate not excluded.  PROCEDURES DURING THIS HOSPITALIZATION:  Hemodialysis.  BRIEF ADMITTING HISTORY:  Please refer to H and P for more details.  On summary, [name] is a 77 year old man with multiple comorbidities including CKD stage 5 followed by [facility], presented to the ER on October 26, 2010, with chief complaint of general malaise and weakness.  Please refer to H and P for more details.  HOSPITAL COURSE:  Initial workup in the ED showed  significant hyperkalemia with a potassium of 7.8.  He was also noted to have elevated creatinine at 8.4.  His chest x-ray showed evidence of volume overload.  1. CKD stage 5/end-stage renal disease/hyperkalemia/pulmonary edema.     Renal Service was consulted.  The patient was started on dialysis     with improvement in his symptoms and electrolyte derangements.  The     patient was continued to be followed by the renal team during this     hospitalization and he had arrangement made for outpatient dialysis     center in [facility] on Tuesdays, Thursdays, and Saturdays.  He was     last dialyzed today and his next dialysis is scheduled for Saturday     as an outpatient. 2. Moderate to severe aortic stenosis.  The patient was seen by     Cardiology and he was noted to be a poor candidate for aortic valve     replacement surgery. 3. History of atrial fibrillation.  The patient's heart rate was     initially low on the 50s on presentation.  He was on both     metoprolol and diltiazem as an outpatient.  Diltiazem was held and     he was continued on metoprolol dose, was adjusted per Cardiology.     I spoke to Dr. Peter Swaziland today and regarding discharge planning,  he cleared him for discharge on Lovenox and heparin and beta-     blockers and for him to see him in the office in about a month and     as per Dr. Swaziland, he is not a good candidate for aortic valve     replacement surgery given his multiple comorbidities. 4. Diabetes mellitus.  Currently controlled.  The patient was on     glyburide as an outpatient.  He was started on gentamicin during     this hospitalization.  However, that is not covered by his     insurance.  I will resume his outpatient glyburide on discharge.     The patient advised to follow his blood glucose level very closely     at home to avoid any hypoglycemic episodes or hyperglycemic     episodes.  The patient to follow with his PCP for further      adjustment of his medications. 5. Anemia secondary to end-stage renal disease.  The patient is     currently getting Aranesp and IV Ferrlecit as per renal team.  DISCHARGE MEDICATIONS: 1. Aranesp 40 mcg IV Saturdays with dialysis. 2. Colace 100 mg p.o. twice daily. 3. Vytorin 10/40 mg 1 tab p.o. daily. 4. Ferrous gluconate 125 mg IV with dialysis. 5. Metoprolol 25/37.5 mg p.o. twice daily. 6. Renal formula vitamin 1 tab p.o. daily at bedtime. 7. Coumadin 5 mg, take 7.5 mg p.o. daily as directed.  Follow for any     further instructions. 8. Lovenox 100 mg injection 100 mg subcutaneously daily for 3 days. 9. Allopurinol 300 mg 1 tab p.o. daily. 10.Glyburide 4 mg half a tablet p.o. daily. 11.ProctoFoam HC rectal foam 1 application rectally daily as needed.  DISCHARGE INSTRUCTIONS: 1. The patient to continue with above medications as prescribed. 2. The patient to follow with Outpatient Hemodialysis Unit in [facility] as arranged by the renal team.  His next dialysis is on     Saturday. 3. INR is currently subtherapeutic.  He is being discharged on Lovenox     and Coumadin.  His INR will need to be rechecked on dialysis on     Saturday and any further adjustment of his Coumadin will be     deferred to the renal team/his PCP. 4. The patient to report any worsening of symptoms or any symptoms to     his PCP or come to the ED. 5. The patient to follow with Dr. Peter Swaziland in the office in 1     month and to follow with his PCP within 1-2 weeks of discharge. 6. The patient to monitor his blood glucose at home regularly and     follow with his PCP for any further adjustment of his medication.  CONDITION ON DISCHARGE:  Stable.          ______________________________ Baltazar Najjar, MD     SA/MEDQ  D:  11/02/2010  T:  11/02/2010  Job:  [id number]  cc:   Veverly Fells. Altheimer, M.D. Peter M. Swazil[place], M.D. Cecille Aver, M.D. Dialysis Unit in [facility]  Electronically  Signed by Hannah Beat MD on 11/05/2010 05:21:26 PM

## 2010-11-08 NOTE — Consult Note (Signed)
Alan [name], SCHOENBERGER NO.:  [id number]  MEDICAL RECORD NO.:  [id number]  LOCATION:  2602                         FACILITY:  [facility]  PHYSICIAN:  Garnetta Buddy, M.D.   DATE OF BIRTH:  1931/05/16  DATE OF CONSULTATION:  10/26/2010 DATE OF DISCHARGE:                                CONSULTATION   REASON FOR CONSULTATION:  Hypokalemia, advanced renal failure.  HISTORY OF PRESENTING ILLNESS:  This is a very pleasant 77 year old gentleman who was sleeping at home with his wife.  He is being followed by Dr. Kathrene Bongo at [facility] in the preparation for dialysis due to advanced kidney failure.  His wife relates the story of 2 weeks of weakness and fatigue, no activity, diminished appetite.  No complaints of nausea, no vomiting.  He has had frequent loose bowel movements over the past 24-48 hours.  He complains of occasional shaking chills over the past 2-3 days and has admitted to shortness of breath with orthopnea and lower extremity swelling.  PAST MEDICAL HISTORY: 1. Prostate cancer. 2. History of anemia. 3. History of coronary artery disease. 4. History of hypertension. 5. History of gout. 6. History of diabetes mellitus x2. 7. History of ventral hernia. 8. History of herniorrhaphy. 9. History of right renal calculi with percutaneous nephrostomy.  MEDICATIONS: 1. Vitamin D 1.25 mg daily. 2. Estazolam 1 mg daily. 3. Magnesium supplementation. 4. Zemplar 1 mcg daily. 5. Potassium 40 mEq daily. 6. Vytorin 10/40 mg daily. 7. Lasix 160 mg daily. 8. Toprol 25 mg b.i.d. 9. Glimepiride 2 mg daily. 10.Diltiazem 240 mg daily. 11.Metolazone 2.5 mg daily. 12.Allopurinol 150 mg daily. 13.Doxazosin 2 mg nightly.  SOCIAL HISTORY:  Married.  No tobacco.  Lives at home.  FAMILY HISTORY:  Negative end-stage renal disease.  Positive coronary artery disease.  ALLERGIES:  No known drug allergies.  REVIEW OF SYSTEMS:  GENERAL:  Admits to weakness  and fatigue.  No fever, no chills.  EYES:  No visual complaints.  No blurred vision or loss of vision.  EARS, NOSE, MOUTH, AND THROAT:  No hearing loss, epistaxis, or sore throat.  CARDIOVASCULAR:  Denies angina.  Admits to orthopnea and lower extremity swelling.  Denies any syncope.  RESPIRATORY:  Denies cough, wheeze, or hemoptysis.  ABDOMEN:  No abdominal pain.  Admits to nausea.  No vomiting.  Frequent loose bowel movements.  UROGENITAL: History of renal calculi.  History of percutaneous nephrostomy.  Admits to some flank pain and frequency of urination.  ENDOCRINE:  History of diabetes.  No history of thyroid or adrenal disease.  DERMATOLOGIC: Denies any skin rash or itching.  NEUROLOGIC:  Admits to some shaking and asterixis,  which has been long standing, worse over the past 2 weeks.  Denies any strokes or seizures.  Denies any paresthesias. Denies any diplopia, dysarthria, or dysphasia.  MUSCULOSKELETAL:  He has been complaining of some flank and back pain.  Does not use nonsteroidal antiinflammatory drugs.  Denies any history of bony metastases from prostate cancer.  PHYSICAL EXAMINATION:  GENERAL:  Alert, very pleasant gentleman, somewhat lethargic, using oxygen, responsive to voice. EYES:  Normocephalic and atraumatic.  Pupils are round, equal, and reactive.  Extraocular movements  are intact. EARS, NOSE, MOUTH, AND THROAT:  External appearance is normal.  Nasal mucosa clear.  Oropharynx is clear. NECK:  Supple.  JVP mildly elevated.  No bruits.  No thrills.  No thyromegaly.  No lymphadenopathy. HEART:  Regular rate and rhythm.  Faint heart sounds.  Systolic murmur 3/6.  No rubs.  No gallops. RESPIRATORY:  Lung fields are clear bilaterally.  No wheezes or rales to percussion and resonant throughout. ABDOMEN:  Soft and nontender.  No hepatosplenomegaly.  Bowel sounds present.  No abdominal bruits. EXTREMITIES:  No cyanosis or clubbing.  Had 2+ peripheral edema.   Had diminished pulses in the left than right leg, but symmetric.  An AV fistula in the right arm,  which was placed on May 25.  MOST RECENT LABS:  Sodium 128, potassium 7.5, chloride 90, CO2 25, BUN 100, creatinine 8.5, glucose is 263, calcium 10.1, INR 1.  Hemoglobin of 14, white blood cell count of 10.9 with platelet count of 278.  Chest x- ray shows moderate CHF.  ASSESSMENT/PLAN: 1. End-stage renal disease.  Baseline creatinine appears to be 3.     This has been followed by Dr. Kathrene Bongo.  It has increased to 8.     We will initiate dialysis therapy. 2. Hypertension.  Volume will be managed with hemodialysis. 3. Hyperkalemia.  We will start dialysis and low-potassium bath. 4. Anemia, stable.  No Epogen. 5. Bones.  Calcium is slightly elevated.  We would avoid vitamin D     products at this time. 6. Diabetes mellitus.  Management per hospitalist.     Garnetta Buddy, M.D.     MWW/MEDQ  D:  10/26/2010  T:  10/27/2010  Job:  [id number]  cc:   Veverly Fells. Altheimer, M.D. Cecille Aver, M.D.  Electronically Signed by Elvis Coil M.D. on 11/08/2010 10:14:53 AM

## 2010-11-11 ENCOUNTER — Encounter: Payer: Self-pay | Admitting: Cardiology

## 2010-11-11 LAB — PROTIME-INR: INR: 5.7 — AB (ref <=1.1)

## 2010-11-14 ENCOUNTER — Telehealth (INDEPENDENT_AMBULATORY_CARE_PROVIDER_SITE_OTHER): Payer: Self-pay

## 2010-11-14 NOTE — Telephone Encounter (Signed)
C/o heel wound. Patient stated the dialysis center had made him a 1:30 appointment today elsewhere for the condition.

## 2010-11-16 ENCOUNTER — Ambulatory Visit (INDEPENDENT_AMBULATORY_CARE_PROVIDER_SITE_OTHER): Payer: Self-pay | Admitting: Cardiovascular Disease

## 2010-11-16 DIAGNOSIS — I4891 Unspecified atrial fibrillation: Secondary | ICD-10-CM

## 2010-11-16 DIAGNOSIS — Z7901 Long term (current) use of anticoagulants: Secondary | ICD-10-CM | POA: Insufficient documentation

## 2010-11-16 DIAGNOSIS — R0989 Other specified symptoms and signs involving the circulatory and respiratory systems: Secondary | ICD-10-CM

## 2010-11-22 ENCOUNTER — Encounter: Payer: Self-pay | Admitting: Nurse Practitioner

## 2010-11-22 ENCOUNTER — Ambulatory Visit (INDEPENDENT_AMBULATORY_CARE_PROVIDER_SITE_OTHER): Payer: MEDICARE | Admitting: *Deleted

## 2010-11-22 ENCOUNTER — Ambulatory Visit (INDEPENDENT_AMBULATORY_CARE_PROVIDER_SITE_OTHER): Payer: MEDICARE | Admitting: Nurse Practitioner

## 2010-11-22 VITALS — BP 110/70 | HR 54 | Ht 72.0 in | Wt 251.4 lb

## 2010-11-22 DIAGNOSIS — Z992 Dependence on renal dialysis: Secondary | ICD-10-CM | POA: Insufficient documentation

## 2010-11-22 DIAGNOSIS — I4891 Unspecified atrial fibrillation: Secondary | ICD-10-CM

## 2010-11-22 DIAGNOSIS — N186 End stage renal disease: Secondary | ICD-10-CM

## 2010-11-22 DIAGNOSIS — I2510 Atherosclerotic heart disease of native coronary artery without angina pectoris: Secondary | ICD-10-CM | POA: Insufficient documentation

## 2010-11-22 DIAGNOSIS — I359 Nonrheumatic aortic valve disorder, unspecified: Secondary | ICD-10-CM

## 2010-11-22 DIAGNOSIS — Z7901 Long term (current) use of anticoagulants: Secondary | ICD-10-CM

## 2010-11-22 DIAGNOSIS — I350 Nonrheumatic aortic (valve) stenosis: Secondary | ICD-10-CM

## 2010-11-22 LAB — POCT INR: INR: 3.8

## 2010-11-22 NOTE — Assessment & Plan Note (Signed)
He has been deemed not a surgical candidate. No cardinal symptoms at this time. We will see him back in about 6 weeks. Patient is agreeable to this plan and will call if any problems develop in the interim.

## 2010-11-22 NOTE — Assessment & Plan Note (Addendum)
EKG today shows atrial fib/flutter. Rate is controlled. He is on coumadin. Will discuss with Dr. Swaziland regarding ? Cardioversion, but I feel like his current management will be long term.   Have discussed with Dr. Swaziland. No plan for cardioversion at this time. Will manage with rate control and anticoagulation. Does not appear symptomatic at this time and has no awareness.

## 2010-11-22 NOTE — Assessment & Plan Note (Signed)
He is now on dialysis. Seems to be tolerating. Still a little weak.

## 2010-11-22 NOTE — Progress Notes (Signed)
Alan [name] Date of Birth: 12-30-1931 Medical Record #[id number]  History of Present Illness: Alan [name] is seen back today for a post hospital visit. He is seen for Dr. [place]. He was recently admitted with volume overload in the setting of end stage renal disease. Dialysis has been started. He remains a little weak. No chest pain. No shortness of breath, no syncope. He does have moderate to severe AS. He is not felt to be a candidate for AVR. He is on coumadin. Last check was high and we will be rechecking today. No excessive bruising. No bleeding reported. He seems to be holding his own for now. Has no real complaint.   Current Outpatient Prescriptions on File Prior to Visit  Medication Sig Dispense Refill  . allopurinol (ZYLOPRIM) 300 MG tablet Take 300 mg by mouth daily.        [name] docusate sodium (COLACE) 100 MG capsule Take 100 mg by mouth 2 (two) times daily.        [name] ezetimibe-simvastatin (VYTORIN) 10-40 MG per tablet Take 1 tablet by mouth at bedtime.        [name] glimepiride (AMARYL) 4 MG tablet Take 2 mg by mouth daily before breakfast.        . metoprolol tartrate (LOPRESSOR) 25 MG tablet Take 37.5 mg by mouth 2 (two) times daily.        . multivitamin (RENA-VIT) TABS tablet Take 1 tablet by mouth daily.        [name] warfarin (COUMADIN) 5 MG tablet Take 5 mg by mouth as directed.         Current Facility-Administered Medications on File Prior to Visit  Medication Dose Route Frequency Provider Last Rate Last Dose  . 0.9 %  sodium chloride infusion  500 mL Intravenous Continuous Yancey Flemings, MD        No Known Allergies  Past Medical History  Diagnosis Date  . Diabetes mellitus   . Gout   . Hypertension   . Hyperlipidemia   . Chronic kidney disease     Started dialysis October 2012  . Arthritis   . Prostate cancer     s/p seed implant  . Cataract     BILATERAL-BEEN REMOVED  . Neuromuscular disorder     DABETIC NEUROPATHY-LOWER EXTREMITY  . Atrial fibrillation     on  coumadin  . Aortic stenosis     moderate to severe, not felt to be a surgical candidate  . Anemia     secondary to end stage renal disease  . Coronary artery disease     remote CABG in 1996; last stress test in 2005; last cath 2005  . Diabetic neuropathy   . Edema   . PAD (peripheral artery disease)   . Diabetic retinopathy   . Chronic anticoagulation     on coumadin  . Nephrolithiasis     prior history of percutaneous nephrostomy    Past Surgical History  Procedure Date  . Ventral hernia repair   . Inguinal hernia repair     right side  . Colonoscopy   . Kidney stone surgery   . Polypectomy   . Tonsillectomy   . Toe surgery     removal little toe right foot  . Cardiac catheterization 04/19/2003    SEVERE 2 VESSEL OBSTRUCTIVE ATHERSCLEROTIC CAD  . Coronary artery bypass graft 1996    LIMA GRAFT TO THE LAD, SEQUENTIAL SAPHENOUS  VEIN GRAFT TO THE FIRST AND SECOND DIAGONIAL BRANCHES, SEQUENTIAL SAPHENOUS  VEIN GRAFT TO THE ACUTE MARGINAL, POSTERIOR DESCENDING, AND POSTERIOR LATERAL BRANCHES OF THE RIGHT CORONARY ARTERY  . Radioactive seed implant   . US echocardiography 09/06/2009    EF 55-60%  . Cardiovascular stress test 04/13/2003    EF 54%. EVIDENCE OF ANTERO-APICAL ISCHEMIA. NORMAL LV SIZE AND FUNCTION    History  Smoking status  . Former Smoker  . Quit date: 04/10/1968  Smokeless tobacco  . Current User  . Types: Chew  Comment: DO IT WHEN GET TASTE FOR IT ONCE-TWICE A MONTH    History  Alcohol Use  . 10.0 oz/week  . 20 drink(s) per week    Family History  Problem Relation Age of Onset  . Heart disease Father   . Hypertension Brother   . Stroke Brother     Review of Systems: The review of systems is positive for difficulty walking. No cardiac complaints.  All other systems were reviewed and are negative.  Physical Exam: BP 110/70  Pulse 54  Ht 6' (1.829 m)  Wt 251 lb 6.4 oz (114.034 kg)  BMI 34.10 kg/m2 Patient is very pleasant and in no acute  distress. He does appear chronically ill. He has lost weight. Skin is warm and dry. Color is normal.  HEENT is unremarkable. Normocephalic/atraumatic. PERRL. Sclera are nonicteric. Neck is supple. No masses. No JVD. Lungs are fairly clear. Cardiac exam shows an irregular rhythm. Rate is controlled. Abdomen is obese but soft. Extremities are with trace edema. Gait and ROM are intact. No gross neurologic deficits noted.   LABORATORY DATA: INR is pending   Assessment / Plan:

## 2010-11-22 NOTE — Patient Instructions (Signed)
Stay on your current medicines. We will check your coumadin level today.   Let us know if you have any chest pain, shortness of breath or passing out spells.  We will see you back in about 6 weeks.   Call the [facility] office at [phone] if you have any questions, problems or concerns.

## 2010-11-22 NOTE — Assessment & Plan Note (Signed)
He remains on his coumadin. Will check INR today.

## 2010-11-22 NOTE — Assessment & Plan Note (Signed)
Remote CABG in 1998 with last cath following abnormal nuclear in 2005. He is managed medically. No current symptoms reported.

## 2010-11-24 ENCOUNTER — Ambulatory Visit: Payer: MEDICARE | Admitting: Nurse Practitioner

## 2010-11-28 NOTE — Progress Notes (Signed)
Addended by: Andrey Cota A on: 11/28/2010 01:59 PM   Modules accepted: Orders

## 2010-12-11 ENCOUNTER — Other Ambulatory Visit: Payer: Self-pay | Admitting: Internal Medicine

## 2010-12-11 ENCOUNTER — Ambulatory Visit (INDEPENDENT_AMBULATORY_CARE_PROVIDER_SITE_OTHER): Payer: Self-pay | Admitting: Internal Medicine

## 2010-12-11 DIAGNOSIS — R0989 Other specified symptoms and signs involving the circulatory and respiratory systems: Secondary | ICD-10-CM

## 2010-12-11 DIAGNOSIS — Z7901 Long term (current) use of anticoagulants: Secondary | ICD-10-CM

## 2010-12-11 DIAGNOSIS — I4891 Unspecified atrial fibrillation: Secondary | ICD-10-CM

## 2010-12-19 LAB — POCT INR: INR: 2.14

## 2010-12-21 ENCOUNTER — Ambulatory Visit (INDEPENDENT_AMBULATORY_CARE_PROVIDER_SITE_OTHER): Payer: Self-pay | Admitting: Cardiovascular Disease

## 2010-12-21 DIAGNOSIS — I4891 Unspecified atrial fibrillation: Secondary | ICD-10-CM

## 2010-12-21 DIAGNOSIS — Z7901 Long term (current) use of anticoagulants: Secondary | ICD-10-CM

## 2010-12-27 ENCOUNTER — Telehealth: Payer: Self-pay | Admitting: Cardiology

## 2010-12-27 ENCOUNTER — Ambulatory Visit: Payer: MEDICARE | Admitting: Nurse Practitioner

## 2010-12-27 MED ORDER — WARFARIN SODIUM 5 MG PO TABS: 5.0000 mg | ORAL_TABLET | ORAL | Status: DC

## 2010-12-27 NOTE — Telephone Encounter (Signed)
Rx done. 

## 2011-01-08 ENCOUNTER — Ambulatory Visit (INDEPENDENT_AMBULATORY_CARE_PROVIDER_SITE_OTHER): Payer: Self-pay | Admitting: Internal Medicine

## 2011-01-08 DIAGNOSIS — I4891 Unspecified atrial fibrillation: Secondary | ICD-10-CM

## 2011-01-08 DIAGNOSIS — R0989 Other specified symptoms and signs involving the circulatory and respiratory systems: Secondary | ICD-10-CM

## 2011-01-08 DIAGNOSIS — Z7901 Long term (current) use of anticoagulants: Secondary | ICD-10-CM

## 2011-01-11 ENCOUNTER — Other Ambulatory Visit (HOSPITAL_COMMUNITY): Payer: Self-pay | Admitting: Nephrology

## 2011-01-11 ENCOUNTER — Ambulatory Visit: Payer: MEDICARE | Admitting: Cardiology

## 2011-01-11 DIAGNOSIS — N186 End stage renal disease: Secondary | ICD-10-CM

## 2011-01-15 ENCOUNTER — Encounter (HOSPITAL_COMMUNITY): Payer: Self-pay

## 2011-01-15 ENCOUNTER — Other Ambulatory Visit (HOSPITAL_COMMUNITY): Payer: Self-pay | Admitting: Nephrology

## 2011-01-15 ENCOUNTER — Ambulatory Visit (HOSPITAL_COMMUNITY)
Admission: RE | Admit: 2011-01-15 | Discharge: 2011-01-15 | Disposition: A | Discharge status: Home / Self Care | Payer: MEDICARE | Source: Ambulatory Visit | Attending: Nephrology | Admitting: Nephrology

## 2011-01-15 DIAGNOSIS — T82898A Other specified complication of vascular prosthetic devices, implants and grafts, initial encounter: Secondary | ICD-10-CM | POA: Insufficient documentation

## 2011-01-15 DIAGNOSIS — M109 Gout, unspecified: Secondary | ICD-10-CM | POA: Insufficient documentation

## 2011-01-15 DIAGNOSIS — N186 End stage renal disease: Secondary | ICD-10-CM

## 2011-01-15 DIAGNOSIS — I739 Peripheral vascular disease, unspecified: Secondary | ICD-10-CM | POA: Insufficient documentation

## 2011-01-15 DIAGNOSIS — E11319 Type 2 diabetes mellitus with unspecified diabetic retinopathy without macular edema: Secondary | ICD-10-CM | POA: Insufficient documentation

## 2011-01-15 DIAGNOSIS — E1142 Type 2 diabetes mellitus with diabetic polyneuropathy: Secondary | ICD-10-CM | POA: Insufficient documentation

## 2011-01-15 DIAGNOSIS — E1139 Type 2 diabetes mellitus with other diabetic ophthalmic complication: Secondary | ICD-10-CM | POA: Insufficient documentation

## 2011-01-15 DIAGNOSIS — I2510 Atherosclerotic heart disease of native coronary artery without angina pectoris: Secondary | ICD-10-CM | POA: Insufficient documentation

## 2011-01-15 DIAGNOSIS — N189 Chronic kidney disease, unspecified: Secondary | ICD-10-CM | POA: Insufficient documentation

## 2011-01-15 DIAGNOSIS — Z8546 Personal history of malignant neoplasm of prostate: Secondary | ICD-10-CM | POA: Insufficient documentation

## 2011-01-15 DIAGNOSIS — Y832 Surgical operation with anastomosis, bypass or graft as the cause of abnormal reaction of the patient, or of later complication, without mention of misadventure at the time of the procedure: Secondary | ICD-10-CM | POA: Insufficient documentation

## 2011-01-15 DIAGNOSIS — I871 Compression of vein: Secondary | ICD-10-CM | POA: Insufficient documentation

## 2011-01-15 DIAGNOSIS — E1149 Type 2 diabetes mellitus with other diabetic neurological complication: Secondary | ICD-10-CM | POA: Insufficient documentation

## 2011-01-15 DIAGNOSIS — I4891 Unspecified atrial fibrillation: Secondary | ICD-10-CM | POA: Insufficient documentation

## 2011-01-15 DIAGNOSIS — I359 Nonrheumatic aortic valve disorder, unspecified: Secondary | ICD-10-CM | POA: Insufficient documentation

## 2011-01-15 DIAGNOSIS — Z7901 Long term (current) use of anticoagulants: Secondary | ICD-10-CM | POA: Insufficient documentation

## 2011-01-15 DIAGNOSIS — M129 Arthropathy, unspecified: Secondary | ICD-10-CM | POA: Insufficient documentation

## 2011-01-15 MED ORDER — IOHEXOL 300 MG/ML  SOLN
50.0000 mL | Freq: Once | INTRAMUSCULAR | Status: AC | PRN
  Administered 2011-01-15: 50 mL via INTRAVENOUS

## 2011-01-15 NOTE — Procedures (Signed)
Technically successful fistulogram with angioplasty.  No immediate complications.   

## 2011-01-15 NOTE — H&P (Signed)
Alan [name] is an 78 y.o. male.   Chief Complaint: ESRD;Right arm fistula stenosis HPI: scheduled for angioplasty/stent placement  Past Medical History  Diagnosis Date  . Diabetes mellitus   . Gout   . Hypertension   . Hyperlipidemia   . Chronic kidney disease     Started dialysis October 2012  . Arthritis   . Prostate cancer     s/p seed implant  . Cataract     BILATERAL-BEEN REMOVED  . Neuromuscular disorder     DABETIC NEUROPATHY-LOWER EXTREMITY  . Atrial fibrillation     on coumadin  . Aortic stenosis     moderate to severe, not felt to be a surgical candidate  . Anemia     secondary to end stage renal disease  . Coronary artery disease     remote CABG in 1996; last stress test in 2005; last cath 2005  . Diabetic neuropathy   . Edema   . PAD (peripheral artery disease)   . Diabetic retinopathy   . Chronic anticoagulation     on coumadin  . Nephrolithiasis     prior history of percutaneous nephrostomy    Past Surgical History  Procedure Date  . Ventral hernia repair   . Inguinal hernia repair     right side  . Colonoscopy   . Kidney stone surgery   . Polypectomy   . Tonsillectomy   . Toe surgery     removal little toe right foot  . Cardiac catheterization 04/19/2003    SEVERE 2 VESSEL OBSTRUCTIVE ATHERSCLEROTIC CAD  . Coronary artery bypass graft 1996    LIMA GRAFT TO THE LAD, SEQUENTIAL SAPHENOUS  VEIN GRAFT TO THE FIRST AND SECOND DIAGONIAL BRANCHES, SEQUENTIAL SAPHENOUS VEIN GRAFT TO THE ACUTE MARGINAL, POSTERIOR DESCENDING, AND POSTERIOR LATERAL BRANCHES OF THE RIGHT CORONARY ARTERY  . Radioactive seed implant   . US echocardiography 09/06/2009    EF 55-60%  . Cardiovascular stress test 04/13/2003    EF 54%. EVIDENCE OF ANTERO-APICAL ISCHEMIA. NORMAL LV SIZE AND FUNCTION    Family History  Problem Relation Age of Onset  . Heart disease Father   . Hypertension Brother   . Stroke Brother    Social History:  reports that he quit smoking about 42  years ago. His smokeless tobacco use includes Chew. He reports that he drinks about 10 ounces of alcohol per week. He reports that he does not use illicit drugs.  Allergies: No Known Allergies  Medications Prior to Admission  Medication Sig Dispense Refill  . allopurinol (ZYLOPRIM) 300 MG tablet Take 300 mg by mouth daily.        [name] ezetimibe-simvastatin (VYTORIN) 10-40 MG per tablet Take 1 tablet by mouth at bedtime.        . ferrous fumarate (HEMOCYTE - 106 MG FE) 325 (106 FE) MG TABS Take 1 tablet by mouth.        [name] glimepiride (AMARYL) 4 MG tablet Take 2 mg by mouth daily before breakfast.        . metoprolol tartrate (LOPRESSOR) 25 MG tablet Take 37.5 mg by mouth 2 (two) times daily.        . multivitamin (RENA-VIT) TABS tablet Take 1 tablet by mouth daily.        [name] warfarin (COUMADIN) 5 MG tablet Take 5 mg by mouth daily. Take 2.5 mg on mon wed and Friday and sat. All other days takes 5 mg        Medications Prior  to Admission  Medication Dose Route Frequency Provider Last Rate Last Dose  . 0.9 %  sodium chloride infusion  500 mL Intravenous Continuous Yancey Flemings, MD        No results found for this or any previous visit (from the past 48 hour(s)). No results found.  Review of Systems  Constitutional: Negative for fever.  Cardiovascular: Negative for chest pain.  Gastrointestinal: Negative for nausea, vomiting and abdominal pain.  Neurological: Negative for headaches.    There were no vitals taken for this visit. Physical Exam  Constitutional: He is oriented to person, place, and time. He appears well-developed and well-nourished.  HENT:  Head: Normocephalic.  Eyes: EOM are normal.  Neck: Normal range of motion.  Cardiovascular: Normal rate, regular rhythm and normal heart sounds.   No murmur heard. Respiratory: Effort normal. He has wheezes.  GI: Soft. Bowel sounds are normal. There is no tenderness.  Musculoskeletal: Normal range of motion.       Slow--uses cane/walker   Neurological: He is alert and oriented to person, place, and time.  Skin: Skin is warm and dry.     Assessment/Plan Rt arm fistula stenosis. Scheduled for pta/stent placement. Pt aware pf procedure benefits and risks and agreeable to proceed. Consent signed.  Kiwana Deblasi A 01/15/2011, 2:12 PM

## 2011-01-17 ENCOUNTER — Ambulatory Visit: Payer: Self-pay | Admitting: Internal Medicine

## 2011-01-17 DIAGNOSIS — I4891 Unspecified atrial fibrillation: Secondary | ICD-10-CM

## 2011-01-17 DIAGNOSIS — Z7901 Long term (current) use of anticoagulants: Secondary | ICD-10-CM

## 2011-01-22 ENCOUNTER — Ambulatory Visit (INDEPENDENT_AMBULATORY_CARE_PROVIDER_SITE_OTHER): Payer: Self-pay | Admitting: Cardiology

## 2011-01-22 DIAGNOSIS — I4891 Unspecified atrial fibrillation: Secondary | ICD-10-CM

## 2011-01-22 DIAGNOSIS — R0989 Other specified symptoms and signs involving the circulatory and respiratory systems: Secondary | ICD-10-CM

## 2011-01-22 DIAGNOSIS — Z7901 Long term (current) use of anticoagulants: Secondary | ICD-10-CM

## 2011-01-26 ENCOUNTER — Ambulatory Visit: Payer: MEDICARE | Admitting: Cardiology

## 2011-02-07 ENCOUNTER — Ambulatory Visit (INDEPENDENT_AMBULATORY_CARE_PROVIDER_SITE_OTHER): Payer: MEDICARE | Admitting: Cardiology

## 2011-02-07 ENCOUNTER — Encounter: Payer: Self-pay | Admitting: Cardiology

## 2011-02-07 VITALS — BP 107/51 | HR 60 | Wt 247.0 lb

## 2011-02-07 DIAGNOSIS — N186 End stage renal disease: Secondary | ICD-10-CM

## 2011-02-07 DIAGNOSIS — I2510 Atherosclerotic heart disease of native coronary artery without angina pectoris: Secondary | ICD-10-CM

## 2011-02-07 DIAGNOSIS — I350 Nonrheumatic aortic (valve) stenosis: Secondary | ICD-10-CM

## 2011-02-07 DIAGNOSIS — I359 Nonrheumatic aortic valve disorder, unspecified: Secondary | ICD-10-CM

## 2011-02-07 DIAGNOSIS — I4891 Unspecified atrial fibrillation: Secondary | ICD-10-CM

## 2011-02-07 NOTE — Assessment & Plan Note (Signed)
His rate is well controlled and he is on anticoagulant therapy. We will continue with his current dose of metoprolol and Coumadin.

## 2011-02-07 NOTE — Assessment & Plan Note (Signed)
He is status post remote CABG in 1998. Cardiac catheterization in 2005 showed patency of all his grafts. He is having no significant anginal symptoms at this time.

## 2011-02-07 NOTE — Assessment & Plan Note (Signed)
He has moderate to severe aortic stenosis by echocardiogram. His valve gradients were similar to August of 2011. He is asymptomatic. I recommended a followup echocardiogram again in 4 months and I will see him afterwards. He would be very high risk for valve replacement based on his age, end-stage renal disease, arrhythmia, diabetes, obesity, and redo surgery. If his valve progresses to the point where he needs intervention we might need to consider whether he would be a candidate for TAVR.

## 2011-02-07 NOTE — Progress Notes (Signed)
Alan [name] Date of Birth: 1931/07/08 Medical Record #[id number]  History of Present Illness: Alan [name] is seen back today for a followup visit. He has been on hemodialysis since October. He is also on anticoagulation now for atrial fibrillation. He complains that he feels cold a lot. He has been tolerating dialysis well without significant hypotension. His weight is down an additional 4 pounds. His breathing is much better since he started on dialysis. He denies any current shortness of breath, orthopnea, chest pain, or dizziness. When he was hospitalized in October of 2012 he did have an echocardiogram that showed moderate LVH with normal ejection fraction of 60-65%. He had moderate to severe aortic stenosis with a valve area of 0.72 cm square. There was mild left atrial enlargement and mild pulmonary hypertension.   Current Outpatient Prescriptions on File Prior to Visit  Medication Sig Dispense Refill  . allopurinol (ZYLOPRIM) 300 MG tablet Take 300 mg by mouth daily. 1/2 daily      . ezetimibe-simvastatin (VYTORIN) 10-40 MG per tablet Take 1 tablet by mouth at bedtime.        . ferrous fumarate (HEMOCYTE - 106 MG FE) 325 (106 FE) MG TABS Take 1 tablet by mouth.        [name] glimepiride (AMARYL) 4 MG tablet Take 2 mg by mouth daily before breakfast.        . metoprolol tartrate (LOPRESSOR) 25 MG tablet Take 37.5 mg by mouth 2 (two) times daily.       . multivitamin (RENA-VIT) TABS tablet Take 1 tablet by mouth daily.        [name] warfarin (COUMADIN) 5 MG tablet Take 5 mg by mouth daily. Take 2.5 mg on mon wed and Friday and sat. All other days takes 5 mg        Current Facility-Administered Medications on File Prior to Visit  Medication Dose Route Frequency Provider Last Rate Last Dose  . 0.9 %  sodium chloride infusion  500 mL Intravenous Continuous Yancey Flemings, MD        No Known Allergies  Past Medical History  Diagnosis Date  . Diabetes mellitus   . Gout   . Hypertension   .  Hyperlipidemia   . Chronic kidney disease     Started dialysis October 2012  . Arthritis   . Prostate cancer     s/p seed implant  . Cataract     BILATERAL-BEEN REMOVED  . Neuromuscular disorder     DABETIC NEUROPATHY-LOWER EXTREMITY  . Atrial fibrillation     on coumadin  . Aortic stenosis     moderate to severe, not felt to be a surgical candidate  . Anemia     secondary to end stage renal disease  . Coronary artery disease     remote CABG in 1996; last stress test in 2005; last cath 2005  . Diabetic neuropathy   . Edema   . PAD (peripheral artery disease)   . Diabetic retinopathy   . Chronic anticoagulation     on coumadin  . Nephrolithiasis     prior history of percutaneous nephrostomy    Past Surgical History  Procedure Date  . Ventral hernia repair   . Inguinal hernia repair     right side  . Colonoscopy   . Kidney stone surgery   . Polypectomy   . Tonsillectomy   . Toe surgery     removal little toe right foot  . Cardiac catheterization 04/19/2003  SEVERE 2 VESSEL OBSTRUCTIVE ATHERSCLEROTIC CAD  . Coronary artery bypass graft 1996    LIMA GRAFT TO THE LAD, SEQUENTIAL SAPHENOUS  VEIN GRAFT TO THE FIRST AND SECOND DIAGONIAL BRANCHES, SEQUENTIAL SAPHENOUS VEIN GRAFT TO THE ACUTE MARGINAL, POSTERIOR DESCENDING, AND POSTERIOR LATERAL BRANCHES OF THE RIGHT CORONARY ARTERY  . Radioactive seed implant   . US echocardiography 09/06/2009    EF 55-60%  . Cardiovascular stress test 04/13/2003    EF 54%. EVIDENCE OF ANTERO-APICAL ISCHEMIA. NORMAL LV SIZE AND FUNCTION    History  Smoking status  . Former Smoker  . Quit date: 04/10/1968  Smokeless tobacco  . Current User  . Types: Chew  Comment: DO IT WHEN GET TASTE FOR IT ONCE-TWICE A MONTH    History  Alcohol Use  . 10.0 oz/week  . 20 drink(s) per week    Family History  Problem Relation Age of Onset  . Heart disease Father   . Hypertension Brother   . Stroke Brother     Review of Systems: The review  of systems is positive for difficulty walking. No cardiac complaints.  All other systems were reviewed and are negative.  Physical Exam: BP 107/51  Pulse 60  Wt 247 lb (112.038 kg) blood pressure was difficult to obtain and was an audible manually. Pressure was obtained with an automatic cuff. Patient is very pleasant and in no acute distress. He does appear chronically ill. He has lost weight. Skin is warm and dry. Color is normal.  HEENT is unremarkable. Normocephalic/atraumatic. PERRL. Sclera are nonicteric. Neck is supple. No masses. No JVD. Lungs are fairly clear. Cardiac exam shows an irregular rhythm. Rate is controlled. Abdomen is obese but soft. Extremities are with trace edema. He has a functioning AV fistula in his right arm. Gait and ROM are intact. No gross neurologic deficits noted.   LABORATORY DATA: INR is pending   Assessment / Plan:

## 2011-02-07 NOTE — Patient Instructions (Signed)
Continue your current medication.  I will see you again in 4 months and we will repeat an Echocardiogram at that time.

## 2011-02-08 ENCOUNTER — Ambulatory Visit (INDEPENDENT_AMBULATORY_CARE_PROVIDER_SITE_OTHER): Payer: Self-pay | Admitting: Internal Medicine

## 2011-02-08 DIAGNOSIS — I4891 Unspecified atrial fibrillation: Secondary | ICD-10-CM

## 2011-02-08 DIAGNOSIS — R0989 Other specified symptoms and signs involving the circulatory and respiratory systems: Secondary | ICD-10-CM

## 2011-02-08 DIAGNOSIS — Z7901 Long term (current) use of anticoagulants: Secondary | ICD-10-CM

## 2011-02-22 LAB — PROTIME-INR: INR: 2.5 — AB (ref 0.9–1.1)

## 2011-02-26 ENCOUNTER — Ambulatory Visit (INDEPENDENT_AMBULATORY_CARE_PROVIDER_SITE_OTHER): Payer: Self-pay | Admitting: Cardiology

## 2011-02-26 DIAGNOSIS — R0989 Other specified symptoms and signs involving the circulatory and respiratory systems: Secondary | ICD-10-CM

## 2011-02-26 DIAGNOSIS — Z7901 Long term (current) use of anticoagulants: Secondary | ICD-10-CM

## 2011-02-26 DIAGNOSIS — I4891 Unspecified atrial fibrillation: Secondary | ICD-10-CM

## 2011-03-08 LAB — PROTIME-INR: INR: 2.1 — AB (ref 0.9–1.1)

## 2011-03-12 ENCOUNTER — Ambulatory Visit: Payer: Self-pay | Admitting: Cardiology

## 2011-03-12 DIAGNOSIS — Z7901 Long term (current) use of anticoagulants: Secondary | ICD-10-CM

## 2011-03-12 DIAGNOSIS — I4891 Unspecified atrial fibrillation: Secondary | ICD-10-CM

## 2011-03-29 LAB — PROTIME-INR: INR: 2.2 — AB (ref 0.9–1.1)

## 2011-04-02 ENCOUNTER — Ambulatory Visit: Payer: Self-pay | Admitting: Internal Medicine

## 2011-04-02 DIAGNOSIS — Z7901 Long term (current) use of anticoagulants: Secondary | ICD-10-CM

## 2011-04-02 DIAGNOSIS — I4891 Unspecified atrial fibrillation: Secondary | ICD-10-CM

## 2011-04-05 ENCOUNTER — Encounter: Payer: Self-pay | Admitting: Cardiology

## 2011-04-05 LAB — PROTIME-INR

## 2011-04-11 ENCOUNTER — Telehealth: Payer: Self-pay | Admitting: Cardiology

## 2011-04-11 NOTE — Telephone Encounter (Signed)
Patient's wife called,stating ever since started on coumadin both eyes have been red.States Left eye has been red for 2 days worse this morning.Fowarded to coumadin clinic.

## 2011-04-11 NOTE — Telephone Encounter (Signed)
Spoke with pt's wife.  States his L eye has been red for the past few days.  No itching.  Some watering.  No change in vision.  Wife states it is a small red spot in the corner of his eye.  Asked patient to continue monitoring and to call if it worsens or begins to affect his vision.

## 2011-04-11 NOTE — Telephone Encounter (Signed)
Pt on blood thinner, and pt's eye red in the corner and swollen, could this be from the med?

## 2011-04-12 LAB — PROTIME-INR: INR: 2.3 — AB (ref 0.9–1.1)

## 2011-04-13 ENCOUNTER — Other Ambulatory Visit (HOSPITAL_COMMUNITY): Payer: Self-pay | Admitting: Nephrology

## 2011-04-13 ENCOUNTER — Ambulatory Visit: Payer: Self-pay | Admitting: Cardiology

## 2011-04-13 DIAGNOSIS — N186 End stage renal disease: Secondary | ICD-10-CM

## 2011-04-13 DIAGNOSIS — I4891 Unspecified atrial fibrillation: Secondary | ICD-10-CM

## 2011-04-13 DIAGNOSIS — Z7901 Long term (current) use of anticoagulants: Secondary | ICD-10-CM

## 2011-04-16 ENCOUNTER — Encounter (HOSPITAL_COMMUNITY): Payer: Self-pay

## 2011-04-16 ENCOUNTER — Ambulatory Visit (HOSPITAL_COMMUNITY)
Admission: RE | Admit: 2011-04-16 | Discharge: 2011-04-16 | Disposition: A | Discharge status: Home / Self Care | Payer: MEDICARE | Source: Ambulatory Visit | Attending: Nephrology | Admitting: Nephrology

## 2011-04-16 ENCOUNTER — Other Ambulatory Visit (HOSPITAL_COMMUNITY): Payer: Self-pay | Admitting: Nephrology

## 2011-04-16 DIAGNOSIS — Z7901 Long term (current) use of anticoagulants: Secondary | ICD-10-CM | POA: Insufficient documentation

## 2011-04-16 DIAGNOSIS — E1139 Type 2 diabetes mellitus with other diabetic ophthalmic complication: Secondary | ICD-10-CM | POA: Insufficient documentation

## 2011-04-16 DIAGNOSIS — Z8546 Personal history of malignant neoplasm of prostate: Secondary | ICD-10-CM | POA: Insufficient documentation

## 2011-04-16 DIAGNOSIS — I871 Compression of vein: Secondary | ICD-10-CM | POA: Insufficient documentation

## 2011-04-16 DIAGNOSIS — N186 End stage renal disease: Secondary | ICD-10-CM

## 2011-04-16 DIAGNOSIS — E785 Hyperlipidemia, unspecified: Secondary | ICD-10-CM | POA: Insufficient documentation

## 2011-04-16 DIAGNOSIS — M129 Arthropathy, unspecified: Secondary | ICD-10-CM | POA: Insufficient documentation

## 2011-04-16 DIAGNOSIS — E1149 Type 2 diabetes mellitus with other diabetic neurological complication: Secondary | ICD-10-CM | POA: Insufficient documentation

## 2011-04-16 DIAGNOSIS — E11359 Type 2 diabetes mellitus with proliferative diabetic retinopathy without macular edema: Secondary | ICD-10-CM | POA: Insufficient documentation

## 2011-04-16 DIAGNOSIS — Y832 Surgical operation with anastomosis, bypass or graft as the cause of abnormal reaction of the patient, or of later complication, without mention of misadventure at the time of the procedure: Secondary | ICD-10-CM | POA: Insufficient documentation

## 2011-04-16 DIAGNOSIS — E1142 Type 2 diabetes mellitus with diabetic polyneuropathy: Secondary | ICD-10-CM | POA: Insufficient documentation

## 2011-04-16 DIAGNOSIS — T82898A Other specified complication of vascular prosthetic devices, implants and grafts, initial encounter: Secondary | ICD-10-CM | POA: Insufficient documentation

## 2011-04-16 DIAGNOSIS — M109 Gout, unspecified: Secondary | ICD-10-CM | POA: Insufficient documentation

## 2011-04-16 DIAGNOSIS — I120 Hypertensive chronic kidney disease with stage 5 chronic kidney disease or end stage renal disease: Secondary | ICD-10-CM | POA: Insufficient documentation

## 2011-04-16 MED ORDER — IOHEXOL 300 MG/ML  SOLN
100.0000 mL | Freq: Once | INTRAMUSCULAR | Status: AC | PRN
  Administered 2011-04-16: 42 mL via INTRAVENOUS

## 2011-04-16 MED ORDER — MIDAZOLAM HCL 2 MG/2ML IJ SOLN
INTRAMUSCULAR | Status: AC
  Filled 2011-04-16: qty 2

## 2011-04-16 MED ORDER — FENTANYL CITRATE 0.05 MG/ML IJ SOLN
INTRAMUSCULAR | Status: AC
  Filled 2011-04-16: qty 2

## 2011-04-16 NOTE — H&P (Signed)
Alan [name] is an 78 y.o. male.   Chief Complaint: ESRD, suboptimal function of dialysis fistula HPI: Pt with ESRD on HD. Has suspected stenosis of fistula. Sent to IR for shuntogram, confirming 2 separate areas of stenosis. Now needs angioplasty.  Past Medical History  Diagnosis Date  . Diabetes mellitus   . Gout   . Hypertension   . Hyperlipidemia   . Chronic kidney disease     Started dialysis October 2012  . Arthritis   . Prostate cancer     s/p seed implant  . Cataract     BILATERAL-BEEN REMOVED  . Neuromuscular disorder     DABETIC NEUROPATHY-LOWER EXTREMITY  . Atrial fibrillation     on coumadin  . Aortic stenosis     moderate to severe, not felt to be a surgical candidate  . Anemia     secondary to end stage renal disease  . Coronary artery disease     remote CABG in 1996; last stress test in 2005; last cath 2005  . Diabetic neuropathy   . Edema   . PAD (peripheral artery disease)   . Diabetic retinopathy   . Chronic anticoagulation     on coumadin  . Nephrolithiasis     prior history of percutaneous nephrostomy    Past Surgical History  Procedure Date  . Ventral hernia repair   . Inguinal hernia repair     right side  . Colonoscopy   . Kidney stone surgery   . Polypectomy   . Tonsillectomy   . Toe surgery     removal little toe right foot  . Cardiac catheterization 04/19/2003    SEVERE 2 VESSEL OBSTRUCTIVE ATHERSCLEROTIC CAD  . Coronary artery bypass graft 1996    LIMA GRAFT TO THE LAD, SEQUENTIAL SAPHENOUS  VEIN GRAFT TO THE FIRST AND SECOND DIAGONIAL BRANCHES, SEQUENTIAL SAPHENOUS VEIN GRAFT TO THE ACUTE MARGINAL, POSTERIOR DESCENDING, AND POSTERIOR LATERAL BRANCHES OF THE RIGHT CORONARY ARTERY  . Radioactive seed implant   . US echocardiography 09/06/2009    EF 55-60%  . Cardiovascular stress test 04/13/2003    EF 54%. EVIDENCE OF ANTERO-APICAL ISCHEMIA. NORMAL LV SIZE AND FUNCTION    Family History  Problem Relation Age of Onset  . Heart  disease Father   . Hypertension Brother   . Stroke Brother    Social History:  reports that he quit smoking about 43 years ago. His smokeless tobacco use includes Chew. He reports that he drinks about 10 ounces of alcohol per week. He reports that he does not use illicit drugs.  Allergies: No Known Allergies  Medications Prior to Admission  Medication Sig Dispense Refill  . allopurinol (ZYLOPRIM) 300 MG tablet Take 300 mg by mouth daily. 1/2 daily      . ezetimibe-simvastatin (VYTORIN) 10-40 MG per tablet Take 1 tablet by mouth at bedtime.        . ferrous fumarate (HEMOCYTE - 106 MG FE) 325 (106 FE) MG TABS Take 1 tablet by mouth.        [name] glimepiride (AMARYL) 4 MG tablet Take 2 mg by mouth daily before breakfast.        . metoprolol tartrate (LOPRESSOR) 25 MG tablet Take 37.5 mg by mouth 2 (two) times daily.       . multivitamin (RENA-VIT) TABS tablet Take 1 tablet by mouth daily.        [name] warfarin (COUMADIN) 5 MG tablet Take 5 mg by mouth daily. Take 2.5 mg on  mon wed and Friday and sat. All other days takes 5 mg        Medications Prior to Admission  Medication Dose Route Frequency Provider Last Rate Last Dose  . 0.9 %  sodium chloride infusion  500 mL Intravenous Continuous Hilarie Fredrickson, MD        No results found for this or any previous visit (from the past 48 hour(s)). No results found.  Review of Systems  Constitutional: Negative for fever and chills.  Respiratory: Negative for cough and shortness of breath.   Cardiovascular: Negative for chest pain and palpitations.  Gastrointestinal: Negative for nausea, vomiting and abdominal pain.    There were no vitals taken for this visit. Physical Exam  Constitutional: He is oriented to person, place, and time. He appears well-developed. No distress.  HENT:  Head: Normocephalic and atraumatic.  Cardiovascular: Normal rate, regular rhythm and normal heart sounds.        Rt UE AVF palpable thrill  Respiratory: Effort normal and  breath sounds normal. He has no wheezes.  GI: Soft. He exhibits no distension. There is no tenderness.  Neurological: He is alert and oriented to person, place, and time.  Psychiatric: He has a normal mood and affect.     Assessment/Plan ESRD with (R)UE AVF with stenosis. Discussed attempt for angioplasty and poss temp access if necessary. Consent signed.  [name] 04/16/2011, 3:33 PM

## 2011-04-17 ENCOUNTER — Telehealth (HOSPITAL_COMMUNITY): Payer: Self-pay

## 2011-05-03 ENCOUNTER — Emergency Department (HOSPITAL_COMMUNITY): Payer: MEDICARE

## 2011-05-03 ENCOUNTER — Encounter (HOSPITAL_COMMUNITY): Payer: Self-pay | Admitting: *Deleted

## 2011-05-03 ENCOUNTER — Inpatient Hospital Stay (HOSPITAL_COMMUNITY)
Admission: EM | Admit: 2011-05-03 | Discharge: 2011-05-12 | DRG: 871 | Disposition: A | Discharge status: Home Health | Payer: MEDICARE | Attending: Internal Medicine | Admitting: Internal Medicine

## 2011-05-03 ENCOUNTER — Inpatient Hospital Stay (HOSPITAL_COMMUNITY): Payer: MEDICARE

## 2011-05-03 DIAGNOSIS — E1129 Type 2 diabetes mellitus with other diabetic kidney complication: Secondary | ICD-10-CM | POA: Diagnosis present

## 2011-05-03 DIAGNOSIS — M109 Gout, unspecified: Secondary | ICD-10-CM | POA: Diagnosis present

## 2011-05-03 DIAGNOSIS — G928 Other toxic encephalopathy: Secondary | ICD-10-CM | POA: Diagnosis present

## 2011-05-03 DIAGNOSIS — N186 End stage renal disease: Secondary | ICD-10-CM | POA: Diagnosis present

## 2011-05-03 DIAGNOSIS — E1149 Type 2 diabetes mellitus with other diabetic neurological complication: Secondary | ICD-10-CM | POA: Diagnosis present

## 2011-05-03 DIAGNOSIS — I4891 Unspecified atrial fibrillation: Secondary | ICD-10-CM | POA: Diagnosis present

## 2011-05-03 DIAGNOSIS — R7881 Bacteremia: Secondary | ICD-10-CM | POA: Diagnosis present

## 2011-05-03 DIAGNOSIS — S98139A Complete traumatic amputation of one unspecified lesser toe, initial encounter: Secondary | ICD-10-CM

## 2011-05-03 DIAGNOSIS — A419 Sepsis, unspecified organism: Secondary | ICD-10-CM | POA: Diagnosis present

## 2011-05-03 DIAGNOSIS — Z79899 Other long term (current) drug therapy: Secondary | ICD-10-CM

## 2011-05-03 DIAGNOSIS — M129 Arthropathy, unspecified: Secondary | ICD-10-CM | POA: Diagnosis present

## 2011-05-03 DIAGNOSIS — I2510 Atherosclerotic heart disease of native coronary artery without angina pectoris: Secondary | ICD-10-CM | POA: Diagnosis present

## 2011-05-03 DIAGNOSIS — Z7901 Long term (current) use of anticoagulants: Secondary | ICD-10-CM

## 2011-05-03 DIAGNOSIS — R4182 Altered mental status, unspecified: Secondary | ICD-10-CM

## 2011-05-03 DIAGNOSIS — D631 Anemia in chronic kidney disease: Secondary | ICD-10-CM | POA: Diagnosis present

## 2011-05-03 DIAGNOSIS — E860 Dehydration: Secondary | ICD-10-CM | POA: Diagnosis present

## 2011-05-03 DIAGNOSIS — I359 Nonrheumatic aortic valve disorder, unspecified: Secondary | ICD-10-CM | POA: Diagnosis present

## 2011-05-03 DIAGNOSIS — I959 Hypotension, unspecified: Secondary | ICD-10-CM | POA: Diagnosis present

## 2011-05-03 DIAGNOSIS — M869 Osteomyelitis, unspecified: Secondary | ICD-10-CM | POA: Diagnosis present

## 2011-05-03 DIAGNOSIS — N2581 Secondary hyperparathyroidism of renal origin: Secondary | ICD-10-CM | POA: Diagnosis present

## 2011-05-03 DIAGNOSIS — Z992 Dependence on renal dialysis: Secondary | ICD-10-CM

## 2011-05-03 DIAGNOSIS — E1142 Type 2 diabetes mellitus with diabetic polyneuropathy: Secondary | ICD-10-CM | POA: Diagnosis present

## 2011-05-03 DIAGNOSIS — Z86010 Personal history of colonic polyps: Secondary | ICD-10-CM

## 2011-05-03 DIAGNOSIS — M519 Unspecified thoracic, thoracolumbar and lumbosacral intervertebral disc disorder: Secondary | ICD-10-CM | POA: Diagnosis present

## 2011-05-03 DIAGNOSIS — I350 Nonrheumatic aortic (valve) stenosis: Secondary | ICD-10-CM | POA: Diagnosis present

## 2011-05-03 DIAGNOSIS — E1169 Type 2 diabetes mellitus with other specified complication: Secondary | ICD-10-CM | POA: Diagnosis present

## 2011-05-03 DIAGNOSIS — Z87891 Personal history of nicotine dependence: Secondary | ICD-10-CM

## 2011-05-03 DIAGNOSIS — G92 Toxic encephalopathy: Secondary | ICD-10-CM

## 2011-05-03 DIAGNOSIS — R609 Edema, unspecified: Secondary | ICD-10-CM | POA: Diagnosis present

## 2011-05-03 DIAGNOSIS — E119 Type 2 diabetes mellitus without complications: Secondary | ICD-10-CM | POA: Diagnosis present

## 2011-05-03 DIAGNOSIS — R197 Diarrhea, unspecified: Secondary | ICD-10-CM | POA: Diagnosis not present

## 2011-05-03 DIAGNOSIS — M9080 Osteopathy in diseases classified elsewhere, unspecified site: Secondary | ICD-10-CM | POA: Diagnosis present

## 2011-05-03 DIAGNOSIS — Z8546 Personal history of malignant neoplasm of prostate: Secondary | ICD-10-CM

## 2011-05-03 DIAGNOSIS — R5381 Other malaise: Secondary | ICD-10-CM | POA: Diagnosis not present

## 2011-05-03 DIAGNOSIS — Z951 Presence of aortocoronary bypass graft: Secondary | ICD-10-CM

## 2011-05-03 DIAGNOSIS — B029 Zoster without complications: Secondary | ICD-10-CM | POA: Diagnosis not present

## 2011-05-03 DIAGNOSIS — J189 Pneumonia, unspecified organism: Secondary | ICD-10-CM | POA: Diagnosis present

## 2011-05-03 DIAGNOSIS — E1139 Type 2 diabetes mellitus with other diabetic ophthalmic complication: Secondary | ICD-10-CM | POA: Diagnosis present

## 2011-05-03 DIAGNOSIS — IMO0002 Reserved for concepts with insufficient information to code with codable children: Secondary | ICD-10-CM

## 2011-05-03 DIAGNOSIS — E11319 Type 2 diabetes mellitus with unspecified diabetic retinopathy without macular edema: Secondary | ICD-10-CM | POA: Diagnosis present

## 2011-05-03 DIAGNOSIS — E785 Hyperlipidemia, unspecified: Secondary | ICD-10-CM | POA: Diagnosis present

## 2011-05-03 DIAGNOSIS — G929 Unspecified toxic encephalopathy: Secondary | ICD-10-CM | POA: Diagnosis present

## 2011-05-03 DIAGNOSIS — I120 Hypertensive chronic kidney disease with stage 5 chronic kidney disease or end stage renal disease: Secondary | ICD-10-CM | POA: Diagnosis present

## 2011-05-03 DIAGNOSIS — A4101 Sepsis due to Methicillin susceptible Staphylococcus aureus: Principal | ICD-10-CM | POA: Diagnosis present

## 2011-05-03 DIAGNOSIS — I739 Peripheral vascular disease, unspecified: Secondary | ICD-10-CM | POA: Diagnosis present

## 2011-05-03 LAB — CBC
HCT: 41.6 % (ref 39.0–52.0)
MCHC: 33.4 g/dL (ref 30.0–36.0)
Platelets: 142 10*3/uL — ABNORMAL LOW (ref 150–400)
RDW: 14 % (ref 11.5–15.5)

## 2011-05-03 LAB — COMPREHENSIVE METABOLIC PANEL
AST: 28 U/L (ref 0–37)
Albumin: 3.7 g/dL (ref 3.5–5.2)
Chloride: 93 mEq/L — ABNORMAL LOW (ref 96–112)
Creatinine, Ser: 6.95 mg/dL — ABNORMAL HIGH (ref 0.50–1.35)
Total Bilirubin: 0.9 mg/dL (ref 0.3–1.2)

## 2011-05-03 LAB — MRSA PCR SCREENING: MRSA by PCR: NEGATIVE

## 2011-05-03 LAB — CARDIAC PANEL(CRET KIN+CKTOT+MB+TROPI)
CK, MB: 1.2 ng/mL (ref 0.3–4.0)
Troponin I: <0.3 ng/mL (ref <0.30)

## 2011-05-03 LAB — GLUCOSE, CAPILLARY: Glucose-Capillary: 109 mg/dL — ABNORMAL HIGH (ref 70–99)

## 2011-05-03 LAB — DIFFERENTIAL
Basophils Absolute: 0 10*3/uL (ref 0.0–0.1)
Basophils Relative: 0 % (ref 0–1)
Monocytes Absolute: 0 10*3/uL — ABNORMAL LOW (ref 0.1–1.0)
Neutro Abs: 5 10*3/uL (ref 1.7–7.7)
Neutrophils Relative %: 96 % — ABNORMAL HIGH (ref 43–77)

## 2011-05-03 LAB — PROTIME-INR: Prothrombin Time: 27.7 seconds — ABNORMAL HIGH (ref 11.6–15.2)

## 2011-05-03 LAB — PROCALCITONIN: Procalcitonin: 0.81 ng/mL

## 2011-05-03 MED ORDER — ONDANSETRON HCL 4 MG/2ML IJ SOLN: 4.0000 mg | Freq: Four times a day (QID) | INTRAMUSCULAR | Status: DC | PRN

## 2011-05-03 MED ORDER — FERROUS FUMARATE 325 (106 FE) MG PO TABS
1.0000 | ORAL_TABLET | Freq: Two times a day (BID) | ORAL | Status: DC
  Administered 2011-05-03 – 2011-05-07 (×9): 106 mg via ORAL
  Filled 2011-05-03 (×11): qty 1

## 2011-05-03 MED ORDER — ZOLPIDEM TARTRATE 5 MG PO TABS: 5.0000 mg | ORAL_TABLET | Freq: Every evening | ORAL | Status: DC | PRN

## 2011-05-03 MED ORDER — SODIUM CHLORIDE 0.9 % IV SOLN: 250.0000 mL | INTRAVENOUS | Status: DC | PRN

## 2011-05-03 MED ORDER — PIPERACILLIN-TAZOBACTAM 3.375 G IVPB
3.3750 g | Freq: Once | INTRAVENOUS | Status: AC
  Administered 2011-05-03: 3.375 g via INTRAVENOUS
  Filled 2011-05-03: qty 50

## 2011-05-03 MED ORDER — WARFARIN - PHARMACIST DOSING INPATIENT: Freq: Every day | Status: DC

## 2011-05-03 MED ORDER — LEVOFLOXACIN IN D5W 750 MG/150ML IV SOLN
750.0000 mg | INTRAVENOUS | Status: AC
  Administered 2011-05-03: 750 mg via INTRAVENOUS
  Filled 2011-05-03: qty 150

## 2011-05-03 MED ORDER — ONDANSETRON HCL 4 MG PO TABS: 4.0000 mg | ORAL_TABLET | Freq: Four times a day (QID) | ORAL | Status: DC | PRN

## 2011-05-03 MED ORDER — SODIUM CHLORIDE 0.9 % IV SOLN: 500.0000 mL | INTRAVENOUS | Status: AC

## 2011-05-03 MED ORDER — HEPARIN SODIUM (PORCINE) 1000 UNIT/ML DIALYSIS
20.0000 [IU]/kg | INTRAMUSCULAR | Status: DC | PRN
  Administered 2011-05-03: 2200 [IU] via INTRAVENOUS_CENTRAL
  Filled 2011-05-03: qty 3

## 2011-05-03 MED ORDER — TEMAZEPAM 15 MG PO CAPS
15.0000 mg | ORAL_CAPSULE | Freq: Every day | ORAL | Status: DC
  Administered 2011-05-03 – 2011-05-11 (×9): 15 mg via ORAL
  Filled 2011-05-03 (×9): qty 1

## 2011-05-03 MED ORDER — MORPHINE SULFATE 2 MG/ML IJ SOLN: 2.0000 mg | INTRAMUSCULAR | Status: DC | PRN

## 2011-05-03 MED ORDER — ACETAMINOPHEN 325 MG PO TABS
ORAL_TABLET | ORAL | Status: AC
  Administered 2011-05-03: 650 mg via ORAL
  Filled 2011-05-03: qty 2

## 2011-05-03 MED ORDER — VANCOMYCIN HCL IN DEXTROSE 1-5 GM/200ML-% IV SOLN
1000.0000 mg | INTRAVENOUS | Status: DC
  Administered 2011-05-03 – 2011-05-05 (×2): 1000 mg via INTRAVENOUS
  Filled 2011-05-03: qty 200

## 2011-05-03 MED ORDER — TRAMADOL HCL 50 MG PO TABS
50.0000 mg | ORAL_TABLET | Freq: Two times a day (BID) | ORAL | Status: DC | PRN
  Filled 2011-05-03: qty 1

## 2011-05-03 MED ORDER — DOCUSATE SODIUM 283 MG RE ENEM
1.0000 | ENEMA | RECTAL | Status: DC | PRN
  Filled 2011-05-03: qty 1

## 2011-05-03 MED ORDER — SODIUM CHLORIDE 0.9 % IV BOLUS (SEPSIS)
1000.0000 mL | Freq: Once | INTRAVENOUS | Status: AC
  Administered 2011-05-03: 1000 mL via INTRAVENOUS

## 2011-05-03 MED ORDER — WARFARIN SODIUM 5 MG PO TABS
5.0000 mg | ORAL_TABLET | ORAL | Status: DC
  Filled 2011-05-03: qty 1

## 2011-05-03 MED ORDER — ALLOPURINOL 150 MG HALF TABLET
150.0000 mg | ORAL_TABLET | Freq: Every day | ORAL | Status: DC
  Administered 2011-05-04 – 2011-05-12 (×10): 150 mg via ORAL
  Filled 2011-05-03 (×10): qty 1

## 2011-05-03 MED ORDER — ACETAMINOPHEN 325 MG PO TABS
650.0000 mg | ORAL_TABLET | Freq: Four times a day (QID) | ORAL | Status: DC | PRN
  Administered 2011-05-03: 650 mg via ORAL

## 2011-05-03 MED ORDER — SODIUM CHLORIDE 0.9 % IJ SOLN
3.0000 mL | Freq: Two times a day (BID) | INTRAMUSCULAR | Status: DC
  Administered 2011-05-04 – 2011-05-12 (×16): 3 mL via INTRAVENOUS

## 2011-05-03 MED ORDER — PIPERACILLIN-TAZOBACTAM IN DEX 2-0.25 GM/50ML IV SOLN
2.2500 g | Freq: Three times a day (TID) | INTRAVENOUS | Status: DC
  Administered 2011-05-03 – 2011-05-04 (×3): 2.25 g via INTRAVENOUS
  Filled 2011-05-03 (×5): qty 50

## 2011-05-03 MED ORDER — CAMPHOR-MENTHOL 0.5-0.5 % EX LOTN
1.0000 "application " | TOPICAL_LOTION | Freq: Three times a day (TID) | CUTANEOUS | Status: DC | PRN
  Filled 2011-05-03: qty 222

## 2011-05-03 MED ORDER — EZETIMIBE-SIMVASTATIN 10-40 MG PO TABS
1.0000 | ORAL_TABLET | Freq: Every day | ORAL | Status: DC
  Administered 2011-05-03 – 2011-05-11 (×9): 1 via ORAL
  Filled 2011-05-03 (×10): qty 1

## 2011-05-03 MED ORDER — VANCOMYCIN HCL IN DEXTROSE 1-5 GM/200ML-% IV SOLN
1000.0000 mg | INTRAVENOUS | Status: AC
  Filled 2011-05-03: qty 200

## 2011-05-03 MED ORDER — ACETAMINOPHEN 650 MG RE SUPP: 650.0000 mg | Freq: Four times a day (QID) | RECTAL | Status: DC | PRN

## 2011-05-03 MED ORDER — WARFARIN SODIUM 2.5 MG PO TABS
2.5000 mg | ORAL_TABLET | ORAL | Status: AC
  Administered 2011-05-04: 2.5 mg via ORAL
  Filled 2011-05-03: qty 1

## 2011-05-03 MED ORDER — NEPRO/CARBSTEADY PO LIQD
237.0000 mL | Freq: Three times a day (TID) | ORAL | Status: DC | PRN
  Filled 2011-05-03: qty 237

## 2011-05-03 MED ORDER — METOPROLOL TARTRATE 25 MG PO TABS
37.5000 mg | ORAL_TABLET | Freq: Two times a day (BID) | ORAL | Status: DC
  Administered 2011-05-03 – 2011-05-10 (×10): 37.5 mg via ORAL
  Filled 2011-05-03 (×17): qty 1

## 2011-05-03 MED ORDER — HYDROXYZINE HCL 25 MG PO TABS
25.0000 mg | ORAL_TABLET | Freq: Three times a day (TID) | ORAL | Status: DC | PRN
  Filled 2011-05-03: qty 1

## 2011-05-03 MED ORDER — SORBITOL 70 % SOLN
30.0000 mL | Status: DC | PRN
  Filled 2011-05-03: qty 30

## 2011-05-03 MED ORDER — RENA-VITE PO TABS
1.0000 | ORAL_TABLET | Freq: Every day | ORAL | Status: DC
  Administered 2011-05-04 – 2011-05-07 (×4): 1 via ORAL
  Filled 2011-05-03 (×6): qty 1

## 2011-05-03 MED ORDER — LEVOFLOXACIN IN D5W 500 MG/100ML IV SOLN: 500.0000 mg | INTRAVENOUS | Status: DC

## 2011-05-03 MED ORDER — SODIUM CHLORIDE 0.9 % IV BOLUS (SEPSIS)
500.0000 mL | Freq: Once | INTRAVENOUS | Status: AC
  Administered 2011-05-04: 500 mL via INTRAVENOUS

## 2011-05-03 MED ORDER — VANCOMYCIN HCL IN DEXTROSE 1-5 GM/200ML-% IV SOLN
1000.0000 mg | Freq: Once | INTRAVENOUS | Status: AC
  Administered 2011-05-03: 1000 mg via INTRAVENOUS
  Filled 2011-05-03: qty 200

## 2011-05-03 MED ORDER — CALCIUM CARBONATE ANTACID 500 MG PO CHEW
3.0000 | CHEWABLE_TABLET | Freq: Three times a day (TID) | ORAL | Status: DC
  Administered 2011-05-04: 600 mg via ORAL
  Filled 2011-05-03 (×5): qty 3

## 2011-05-03 MED ORDER — OXYCODONE HCL 5 MG PO TABS: 5.0000 mg | ORAL_TABLET | ORAL | Status: DC | PRN

## 2011-05-03 MED ORDER — ACETAMINOPHEN 325 MG PO TABS
650.0000 mg | ORAL_TABLET | Freq: Four times a day (QID) | ORAL | Status: DC | PRN
  Administered 2011-05-03 (×2): 650 mg via ORAL
  Filled 2011-05-03 (×2): qty 2

## 2011-05-03 MED ORDER — ASPIRIN EC 81 MG PO TBEC
81.0000 mg | DELAYED_RELEASE_TABLET | Freq: Every day | ORAL | Status: DC
  Administered 2011-05-04 – 2011-05-12 (×8): 81 mg via ORAL
  Filled 2011-05-03 (×10): qty 1

## 2011-05-03 MED ORDER — SODIUM CHLORIDE 0.9 % IV SOLN: 500.0000 mL | INTRAVENOUS | Status: DC

## 2011-05-03 MED ORDER — SODIUM CHLORIDE 0.9 % IJ SOLN: 3.0000 mL | INTRAMUSCULAR | Status: DC | PRN

## 2011-05-03 MED ORDER — CALCIUM CARBONATE 1250 MG/5ML PO SUSP
500.0000 mg | Freq: Four times a day (QID) | ORAL | Status: DC | PRN
  Filled 2011-05-03: qty 5

## 2011-05-03 MED ORDER — GLIMEPIRIDE 2 MG PO TABS
2.0000 mg | ORAL_TABLET | Freq: Every day | ORAL | Status: DC
  Filled 2011-05-03 (×2): qty 1

## 2011-05-03 NOTE — ED Notes (Signed)
Attempted I&O cath - no urinary output. Dr. Ranae Palms made aware.

## 2011-05-03 NOTE — Progress Notes (Addendum)
ANTICOAGULATION and ANTIBIOTIC CONSULT NOTE - Initial Consult  Pharmacy Consult for Coumadin, vancomycin, and zosyn Indication: chronic Afib; PNA  No Known Allergies  Patient Measurements: weight 245 lbs   Vital Signs: Temp: 99 F (37.2 C) (04/25 1115) Temp src: Oral (04/25 1115) BP: 92/37 mmHg (04/25 1115) Pulse Rate: 79  (04/25 1115)  Labs:  Alan [name] 05/03/11 0325 05/03/11 0322  HGB -- 13.9  HCT -- 41.6  PLT -- 142*  APTT -- 33  LABPROT -- 27.7*  INR -- 2.53*  HEPARINUNFRC -- --  CREATININE -- 6.95*  CKTOTAL 104 --  CKMB 1.2 --  TROPONINI <0.30 --   The CrCl is unknown because both a height and weight (above a minimum accepted value) are required for this calculation.  Medical History: Past Medical History  Diagnosis Date  . Diabetes mellitus   . Gout   . Hypertension   . Hyperlipidemia   . Chronic kidney disease     Started dialysis October 2012  . Arthritis   . Prostate cancer     s/p seed implant  . Cataract     BILATERAL-BEEN REMOVED  . Neuromuscular disorder     DABETIC NEUROPATHY-LOWER EXTREMITY  . Atrial fibrillation     on coumadin  . Aortic stenosis     moderate to severe, not felt to be a surgical candidate  . Anemia     secondary to end stage renal disease  . Coronary artery disease     remote CABG in 1996; last stress test in 2005; last cath 2005  . Diabetic neuropathy   . Edema   . PAD (peripheral artery disease)   . Diabetic retinopathy   . Chronic anticoagulation     on coumadin  . Nephrolithiasis     prior history of percutaneous nephrostomy  . Shortness of breath     Medications:  See med rec  Assessment: Patient is a 78 y.o M with ESRD (regular HD schedule TTS) presented to the ED with c/o AMS.   Wife stated that he didn't go to his scheduled dialysis session because he was admited to the ED. Patient was noted to be febrile and chest x-ray with suspicion for PNA.  Patient received vancomycin 1gm in the ED at 5 AM and zosyn  3.375gm IV at 4 AM today.    He was on coumadin PTA for hx Afib.  Home coumadin dose 2.5mg  daily except 5mg  on Tue, Thurs, and Sundays (verified with Coumadin clinic).  Pt's wife reported that the last dose he took was on on 05/02/11. INR is therapeutic today at 2.53.  Goal of Therapy:  INR 2-3; pre-HD vancomycin level= 15-25   Plan:  1) Will give an additional vancomycin 1gm now (to get a total of 2gm total for today), then vancomycin 1gm IV after each HD session.  Will f/u plan for HD once admitted. 2) Zosyn 2.25gm IV q8h 3) resume home coumadin regimen regimen of 2.5mg  daily except 5mg  on Tues, Thurs, Sun  4) Will change levaquin dose to 750mg  IV x1, then 500mg  IV q48h due to renal function  Kaenan Jake P 05/03/2011,12:52 PM

## 2011-05-03 NOTE — ED Notes (Signed)
Called [phone] to give report. RN unavailable to take report, will call back.

## 2011-05-03 NOTE — ED Notes (Signed)
MD in room at this time. Pt son at bedside.

## 2011-05-03 NOTE — ED Notes (Signed)
Notified RN CBG [id number]

## 2011-05-03 NOTE — Consult Note (Addendum)
[facility] Renal Consultation Note    Indication for Consultation:  Management of ESRD/hemodialysis; anemia, hypertension/volume and secondary hyperparathyroidism  HPI: Alan [name] is a 78 y.o. male with ESRD secondary to diabetes on HD since October 2012 presented to the ED this am with confusion, SOB and associated fever and chills. In the ED, fever was 102  During his last HD treatment, he was afebrile and had a net UF of 3.5 without a BP drop.  His wife said he complained of being cold all day yesterday and he when he woke up at 2 am he was confused.  She also noted that he had taken a sleeping pill that evening.  He endorses aches, weakness and slight cough.  He denies nausea, vomiting or diarrhea.  He had a "normal" large BM this am, urinates small amounts about once daily without dysuria or urgency. He is due for dialyisis today. His wife states they are waiting for a step-down bed.   Dialysis Orders: Center: [facility]  On TTS EDW 111 (left at 110.7 4/23) HD Bath 2K 2.2 5 Ca Time 4.25Heparin 3400  Access right upper AVF BFR 500 DFR A 1.5 Zemplar IV/HD Epogen none Venofer 100 q week   Past Medical History  Diagnosis Date  . Diabetes mellitus   . Gout   . Hypertension   . Hyperlipidemia   . Chronic kidney disease     Started dialysis October 2012  . Arthritis   . Prostate cancer     s/p seed implant  . Cataract     BILATERAL-BEEN REMOVED  . Neuromuscular disorder     DABETIC NEUROPATHY-LOWER EXTREMITY  . Atrial fibrillation     on coumadin  . Aortic stenosis     moderate to severe, not felt to be a surgical candidate  . Anemia     secondary to end stage renal disease  . Coronary artery disease     remote CABG in 1996; last stress test in 2005; last cath 2005  . Diabetic neuropathy   . Edema   . PAD (peripheral artery disease)   . Diabetic retinopathy   . Chronic anticoagulation     on coumadin  . Nephrolithiasis     prior history of  percutaneous nephrostomy  . Shortness of breath    Past Surgical History  Procedure Date  . Ventral hernia repair   . Inguinal hernia repair     right side  . Colonoscopy   . Kidney stone surgery   . Polypectomy   . Tonsillectomy   . Toe surgery     removal little toe right foot  . Cardiac catheterization 04/19/2003    SEVERE 2 VESSEL OBSTRUCTIVE ATHERSCLEROTIC CAD  . Coronary artery bypass graft 1996    LIMA GRAFT TO THE LAD, SEQUENTIAL SAPHENOUS  VEIN GRAFT TO THE FIRST AND SECOND DIAGONIAL BRANCHES, SEQUENTIAL SAPHENOUS VEIN GRAFT TO THE ACUTE MARGINAL, POSTERIOR DESCENDING, AND POSTERIOR LATERAL BRANCHES OF THE RIGHT CORONARY ARTERY  . Radioactive seed implant   . US echocardiography 09/06/2009    EF 55-60%  . Cardiovascular stress test 04/13/2003    EF 54%. EVIDENCE OF ANTERO-APICAL ISCHEMIA. NORMAL LV SIZE AND FUNCTION   Family History  Problem Relation Age of Onset  . Heart disease Father   . Hypertension Brother   . Stroke Brother    Social History: Mrried 52 years; retired DC Nurse, adult at [employer]  reports that he quit smoking about 43 years ago. He  has quit using smokeless tobacco. His smokeless tobacco use included Chew. He reports that he drinks about 10 ounces of alcohol per week. He reports that he does not use illicit drugs. No Known Allergies Prior to Admission medications   Medication Sig Start Date End Date Taking? Authorizing Provider  allopurinol (ZYLOPRIM) 300 MG tablet Take 300 mg by mouth daily. 1/2 daily   Yes Historical Provider, MD  aspirin EC 81 MG tablet Take 81 mg by mouth daily.   Yes Historical Provider, MD  estazolam (PROSOM) 2 MG tablet Take 2 mg by mouth at bedtime as needed. sleep   Yes Historical Provider, MD  glimepiride (AMARYL) 4 MG tablet Take 2 mg by mouth daily before breakfast.     Yes Historical Provider, MD  metoprolol tartrate (LOPRESSOR) 25 MG tablet Take 37.5 mg by mouth 2 (two) times daily.    Yes Historical Provider, MD    multivitamin (RENA-VIT) TABS tablet Take 1 tablet by mouth daily.     Yes Historical Provider, MD  traMADol (ULTRAM) 50 MG tablet Take 50 mg by mouth 2 (two) times daily as needed. pain   Yes Historical Provider, MD  warfarin (COUMADIN) 5 MG tablet Take 5 mg by mouth daily. Take 2.5 mg on mon wed and Friday and sat. All other days takes 5 mg  12/27/10  Yes Peter M [place], MD  He also takes Tums 500 3 ac tid  Current Facility-Administered Medications  Medication Dose Route Frequency Provider Last Rate Last Dose  . 0.9 %  sodium chloride infusion  500 mL Intravenous Continuous Hilarie Fredrickson, MD      . acetaminophen (TYLENOL) tablet 650 mg  650 mg Oral Q6H PRN Rometta Emery, MD   650 mg at 05/03/11 1025   Or  . acetaminophen (TYLENOL) suppository 650 mg  650 mg Rectal Q6H PRN Rometta Emery, MD      . piperacillin-tazobactam (ZOSYN) IVPB 3.375 g  3.375 g Intravenous Once Loren Racer, MD   3.375 g at 05/03/11 0402  . sodium chloride 0.9 % bolus 1,000 mL  1,000 mL Intravenous Once Loren Racer, MD   1,000 mL at 05/03/11 0356  . vancomycin (VANCOCIN) IVPB 1000 mg/200 mL premix  1,000 mg Intravenous Once Loren Racer, MD   1,000 mg at 05/03/11 9147   Current Outpatient Prescriptions  Medication Sig Dispense Refill  . allopurinol (ZYLOPRIM) 300 MG tablet Take 300 mg by mouth daily. 1/2 daily      . aspirin EC 81 MG tablet Take 81 mg by mouth daily.      [name] estazolam (PROSOM) 2 MG tablet Take 2 mg by mouth at bedtime as needed. sleep      . glimepiride (AMARYL) 4 MG tablet Take 2 mg by mouth daily before breakfast.        . metoprolol tartrate (LOPRESSOR) 25 MG tablet Take 37.5 mg by mouth 2 (two) times daily.       . multivitamin (RENA-VIT) TABS tablet Take 1 tablet by mouth daily.        . traMADol (ULTRAM) 50 MG tablet Take 50 mg by mouth 2 (two) times daily as needed. pain      . warfarin (COUMADIN) 5 MG tablet Take 5 mg by mouth daily. Take 2.5 mg on mon wed and Friday and sat. All  other days takes 5 mg        Labs: Basic Metabolic Panel:  Lab 05/03/11 8295  NA 135  K 4.4  CL 93*  CO2 26  GLUCOSE 191*  BUN 28*  CREATININE 6.95*  CALCIUM 9.1  ALB --  PHOS --   Liver Function Tests:  Lab 05/03/11 0322  AST 28  ALT 18  ALKPHOS 83  BILITOT 0.9  PROT 7.4  ALBUMIN 3.7  CBC:  Lab 05/03/11 0322  WBC 5.2  NEUTROABS 5.0  HGB 13.9  HCT 41.6  MCV 92.2  PLT 142*   Cardiac Enzymes:  Lab 05/03/11 0325  CKTOTAL 104  CKMB 1.2  CKMBINDEX --  TROPONINI <0.30   CBG: No results found for this basename: GLUCAP:5 in the last 168 hours Lab Results  Component Value Date   INR 2.53* 05/03/2011    Studies/Results: Dg Chest Port 1 View  05/03/2011  *RADIOLOGY REPORT*  Clinical Data: Fever and shortness of breath  PORTABLE CHEST - 1 VIEW  Comparison: 10/29/2010  Findings: Postoperative changes in the mediastinum, stable since previous study.  Shallow inspiration.  Heart size and pulmonary vascularity seem prominent suggesting congestion.  Minimal infiltration or atelectasis in the lung bases.  Appearance is similar to previous study.  No blunting of costophrenic angles.  No pneumothorax.  IMPRESSION: Cardiac enlargement with mild pulmonary vascular congestion.  Mild infiltration or atelectasis in the lung bases.  Original Report Authenticated By: Marlon Pel, M.D.   ROS: As per HPI plus bilateral LE neuropathy, thickened area on heal, ambulates with walker or cane. Appetite and energy poor recently.  Physical Exam: Filed Vitals:   05/03/11 1000 05/03/11 1005 05/03/11 1043 05/03/11 1115  BP: 91/78  98/40 92/37  Pulse: 83  79 79  Temp:  102 F (38.9 C)  99 F (37.2 C)  TempSrc:  Rectal  Oral  Resp: 20  20 22   SpO2: 95%  99% 95%     General: Well developed, well nourished elderly male in no acute distress; sats good on room air, uremic odor Head: Normocephalic, atraumatic, sclera non-icteric, mucus membranes are moist; lower front teeth only Neck:  Supple. JVD not elevated; no LAD Lungs: Clear bilaterally to auscultation without wheezes, rales, or rhonchi. Breathing is unlabored. Heart: irreg  II/VI murmur Abdomen: Obese, soft, non-tender, non-distended with normoactive bowel sounds. No rebound/guarding M-S:  Strength and tone appear normal for age. Lower extremities: tr lower edema; no ischemic changes, no open wounds; left heal thickened skin, like only healed heal ulcer; right fifth toe missing - remote amputation. Neuro: A little drowsy; moves all extremities spontaneously. Psych:  Responds to questions but somewhat sluggish response Dialysis Access: right upper AVF + bruit, +warm to touch over RUA and AVF in particular, nontender, very mild erythema around general area of needle sticks  Assessment/Plan 1. Fever- ? CAP - on IV levaquin , zosyn and Vanc; WBC "normal" but 96% neutrophils Does not seems like the typical PNA to me BC x 2 drawn; urine ordered. AVF right upper arm is warm to touch, minimal erythema- watch closely.  2. ESRD - TTS - HD today per routine; had PTA focal stenosis cephalic vein earlier this month 3. Hypertension/volume  - BP much lower than usual - likely due to infection; BP run 120 - 130 systolic during his HD treatments; low goal today as tolerated. 4. Anemia  - Hgb 13.9 - not on ESA; hold IV Fe for now 5. Metabolic bone disease -  Continue binders and zemplar 6. Nutrition - high protein renal diet 7. Atrial fibrillation -  Rate controlled on BB-INR therapeutic; pharmacy to dose 8. Diabetes -  per primary 9. Hx gout - continue allopurinol Sheffield Slider, PA-C [facility] [phone] 05/03/2011, 12:35 PM   Patient seen and examined and agree with assessment and plan as above with additions in bold.   Alan Moselle  MD [employer] [phone] pgr    [phone] cell 05/03/2011, 5:04 PM

## 2011-05-03 NOTE — H&P (Signed)
Alan [name] is an 78 y.o. male.   Chief Complaint: Altered mental status HPI: A 78 year old gentleman with end-stage renal disease and diabetes who was brought in by his wife secondary to altered mental status. Patient apparently has been fine until yesterday when she noted that he was more confused than usual associated with some fever and chills. He also has more shortness of breath and cough and generally not doing well. She brought him to the emergency room where he was found to have a fever to 102, chest x-ray that is suggestive of pneumonia and still confused. Most of the history was therefore obtained from his . No nausea vomiting or diarrhea no increased leg swelling, he is mildly hypotensive has SIRS with evidence of possible pneumonia.  Past Medical History  Diagnosis Date  . Diabetes mellitus   . Gout   . Hypertension   . Hyperlipidemia   . Chronic kidney disease     Started dialysis October 2012  . Arthritis   . Prostate cancer     s/p seed implant  . Cataract     BILATERAL-BEEN REMOVED  . Neuromuscular disorder     DABETIC NEUROPATHY-LOWER EXTREMITY  . Atrial fibrillation     on coumadin  . Aortic stenosis     moderate to severe, not felt to be a surgical candidate  . Anemia     secondary to end stage renal disease  . Coronary artery disease     remote CABG in 1996; last stress test in 2005; last cath 2005  . Diabetic neuropathy   . Edema   . PAD (peripheral artery disease)   . Diabetic retinopathy   . Chronic anticoagulation     on coumadin  . Nephrolithiasis     prior history of percutaneous nephrostomy    Past Surgical History  Procedure Date  . Ventral hernia repair   . Inguinal hernia repair     right side  . Colonoscopy   . Kidney stone surgery   . Polypectomy   . Tonsillectomy   . Toe surgery     removal little toe right foot  . Cardiac catheterization 04/19/2003    SEVERE 2 VESSEL OBSTRUCTIVE ATHERSCLEROTIC CAD  . Coronary artery bypass  graft 1996    LIMA GRAFT TO THE LAD, SEQUENTIAL SAPHENOUS  VEIN GRAFT TO THE FIRST AND SECOND DIAGONIAL BRANCHES, SEQUENTIAL SAPHENOUS VEIN GRAFT TO THE ACUTE MARGINAL, POSTERIOR DESCENDING, AND POSTERIOR LATERAL BRANCHES OF THE RIGHT CORONARY ARTERY  . Radioactive seed implant   . US echocardiography 09/06/2009    EF 55-60%  . Cardiovascular stress test 04/13/2003    EF 54%. EVIDENCE OF ANTERO-APICAL ISCHEMIA. NORMAL LV SIZE AND FUNCTION    Family History  Problem Relation Age of Onset  . Heart disease Father   . Hypertension Brother   . Stroke Brother    Social History:  reports that he quit smoking about 43 years ago. His smokeless tobacco use includes Chew. He reports that he drinks about 10 ounces of alcohol per week. He reports that he does not use illicit drugs.  Allergies: No Known Allergies   (Not in a hospital admission)  Results for orders placed during the hospital encounter of 05/03/11 (from the past 48 hour(s))  CBC     Status: Abnormal   Collection Time   05/03/11  3:22 AM      Component Value Range Comment   WBC 5.2  4.0 - 10.5 (K/uL)    RBC 4.51  4.22 - 5.81 (MIL/uL)    Hemoglobin 13.9  13.0 - 17.0 (g/dL)    HCT 29.5  62.1 - 30.8 (%)    MCV 92.2  78.0 - 100.0 (fL)    MCH 30.8  26.0 - 34.0 (pg)    MCHC 33.4  30.0 - 36.0 (g/dL)    RDW 65.7  84.6 - 96.2 (%)    Platelets 142 (*) 150 - 400 (K/uL)   DIFFERENTIAL     Status: Abnormal   Collection Time   05/03/11  3:22 AM      Component Value Range Comment   Neutrophils Relative 96 (*) 43 - 77 (%)    Neutro Abs 5.0  1.7 - 7.7 (K/uL)    Lymphocytes Relative 4 (*) 12 - 46 (%)    Lymphs Abs 0.2 (*) 0.7 - 4.0 (K/uL)    Monocytes Relative 1 (*) 3 - 12 (%)    Monocytes Absolute 0.0 (*) 0.1 - 1.0 (K/uL)    Eosinophils Relative 0  0 - 5 (%)    Eosinophils Absolute 0.0  0.0 - 0.7 (K/uL)    Basophils Relative 0  0 - 1 (%)    Basophils Absolute 0.0  0.0 - 0.1 (K/uL)   COMPREHENSIVE METABOLIC PANEL     Status: Abnormal    Collection Time   05/03/11  3:22 AM      Component Value Range Comment   Sodium 135  135 - 145 (mEq/L)    Potassium 4.4  3.5 - 5.1 (mEq/L)    Chloride 93 (*) 96 - 112 (mEq/L)    CO2 26  19 - 32 (mEq/L)    Glucose, Bld 191 (*) 70 - 99 (mg/dL)    BUN 28 (*) 6 - 23 (mg/dL)    Creatinine, Ser 9.52 (*) 0.50 - 1.35 (mg/dL)    Calcium 9.1  8.4 - 10.5 (mg/dL)    Total Protein 7.4  6.0 - 8.3 (g/dL)    Albumin 3.7  3.5 - 5.2 (g/dL)    AST 28  0 - 37 (U/L)    ALT 18  0 - 53 (U/L)    Alkaline Phosphatase 83  39 - 117 (U/L)    Total Bilirubin 0.9  0.3 - 1.2 (mg/dL)    GFR calc non Af Amer 7 (*) >90 (mL/min)    GFR calc Af Amer 8 (*) >90 (mL/min)   PROCALCITONIN     Status: Normal   Collection Time   05/03/11  3:22 AM      Component Value Range Comment   Procalcitonin 0.81     PROTIME-INR     Status: Abnormal   Collection Time   05/03/11  3:22 AM      Component Value Range Comment   Prothrombin Time 27.7 (*) 11.6 - 15.2 (seconds)    INR 2.53 (*) 0.00 - 1.49    APTT     Status: Normal   Collection Time   05/03/11  3:22 AM      Component Value Range Comment   aPTT 33  24 - 37 (seconds)   CARDIAC PANEL(CRET KIN+CKTOT+MB+TROPI)     Status: Normal   Collection Time   05/03/11  3:25 AM      Component Value Range Comment   Total CK 104  7 - 232 (U/L)    CK, MB 1.2  0.3 - 4.0 (ng/mL)    Troponin I <0.30  <0.30 (ng/mL)    Relative Index 1.2  0.0 - 2.5  LACTIC ACID, PLASMA     Status: Abnormal   Collection Time   05/03/11  3:25 AM      Component Value Range Comment   Lactic Acid, Venous 2.3 (*) 0.5 - 2.2 (mmol/L)    Dg Chest Port 1 View  05/03/2011  *RADIOLOGY REPORT*  Clinical Data: Fever and shortness of breath  PORTABLE CHEST - 1 VIEW  Comparison: 10/29/2010  Findings: Postoperative changes in the mediastinum, stable since previous study.  Shallow inspiration.  Heart size and pulmonary vascularity seem prominent suggesting congestion.  Minimal infiltration or atelectasis in the lung bases.   Appearance is similar to previous study.  No blunting of costophrenic angles.  No pneumothorax.  IMPRESSION: Cardiac enlargement with mild pulmonary vascular congestion.  Mild infiltration or atelectasis in the lung bases.  Original Report Authenticated By: Marlon Pel, M.D.    Review of Systems  Constitutional: Positive for fever, chills, malaise/fatigue and diaphoresis.  HENT: Negative.   Eyes: Negative.   Respiratory: Positive for cough, sputum production and shortness of breath. Negative for hemoptysis and wheezing.   Cardiovascular: Negative.   Gastrointestinal: Negative.   Genitourinary: Negative.   Musculoskeletal: Positive for myalgias.  Skin: Negative.   Neurological: Positive for loss of consciousness.  Endo/Heme/Allergies: Negative.   Psychiatric/Behavioral: Negative.     Blood pressure 95/50, pulse 100, temperature 102.3 F (39.1 C), temperature source Oral, resp. rate 24, SpO2 96.00%. Physical Exam  Constitutional: He is oriented to person, place, and time. He appears well-developed and well-nourished.  HENT:  Head: Normocephalic and atraumatic.  Right Ear: External ear normal.  Left Ear: External ear normal.  Nose: Nose normal.  Mouth/Throat: Oropharynx is clear and moist.  Eyes: Conjunctivae and EOM are normal. Pupils are equal, round, and reactive to light.  Neck: Normal range of motion. Neck supple.  Cardiovascular: Regular rhythm, normal heart sounds and intact distal pulses.  Tachycardia present.   Respiratory: Effort normal and breath sounds normal.  GI: Soft. Bowel sounds are normal.  Musculoskeletal: Normal range of motion.  Neurological: He is alert and oriented to person, place, and time. He has normal reflexes.  Skin: Skin is warm and dry.  Psychiatric: He has a normal mood and affect.     Assessment/Plan Assessment this is a 78 year old gentleman presenting with sepsis syndrome with possible healthcare associated pneumonia. Patient is a  dialysis patient has multiple medical problems. Plan #1 sepsis: We will admit the patient to step down unit, treat for healthcare associated pneumonia, hydrate the patient cautiously due to renal function, get cultures of blood urine and sputum. #2 end-stage renal disease: Nephrology will be consulted for hemodialysis #3 atrial fibrillation: Continue his Coumadin and rate control #4 pneumonia: Will be treated as healthcare associated due to his hemodialysis #5 toxic metabolic encephalopathy: From sepsis and his pneumonia. Will check frequent neuro  #6 other medical problems: These are chronic and stable. Continue to monitor them in the hospital  [facility] 05/03/2011, 6:18 AM

## 2011-05-03 NOTE — ED Provider Notes (Signed)
History     CSN: [id number]  Arrival date & time 05/03/11  0247   First MD Initiated Contact with Patient 05/03/11 0250      Chief Complaint  Patient presents with  . Altered Mental Status    (Consider location/radiation/quality/duration/timing/severity/associated sxs/prior treatment) HPI Per wife, pt normally A and O x 3, ambulatory. Was c/o chills all day yesterday. Wife states that she noticed he was confused at midnight and not following commands. Pt not answering questions.  Past Medical History  Diagnosis Date  . Diabetes mellitus   . Gout   . Hypertension   . Hyperlipidemia   . Chronic kidney disease     Started dialysis October 2012  . Arthritis   . Prostate cancer     s/p seed implant  . Cataract     BILATERAL-BEEN REMOVED  . Neuromuscular disorder     DABETIC NEUROPATHY-LOWER EXTREMITY  . Atrial fibrillation     on coumadin  . Aortic stenosis     moderate to severe, not felt to be a surgical candidate  . Anemia     secondary to end stage renal disease  . Coronary artery disease     remote CABG in 1996; last stress test in 2005; last cath 2005  . Diabetic neuropathy   . Edema   . PAD (peripheral artery disease)   . Diabetic retinopathy   . Chronic anticoagulation     on coumadin  . Nephrolithiasis     prior history of percutaneous nephrostomy  . Shortness of breath     Past Surgical History  Procedure Date  . Ventral hernia repair   . Inguinal hernia repair     right side  . Colonoscopy   . Kidney stone surgery   . Polypectomy   . Tonsillectomy   . Toe surgery     removal little toe right foot  . Cardiac catheterization 04/19/2003    SEVERE 2 VESSEL OBSTRUCTIVE ATHERSCLEROTIC CAD  . Coronary artery bypass graft 1996    LIMA GRAFT TO THE LAD, SEQUENTIAL SAPHENOUS  VEIN GRAFT TO THE FIRST AND SECOND DIAGONIAL BRANCHES, SEQUENTIAL SAPHENOUS VEIN GRAFT TO THE ACUTE MARGINAL, POSTERIOR DESCENDING, AND POSTERIOR LATERAL BRANCHES OF THE RIGHT CORONARY  ARTERY  . Radioactive seed implant   . US echocardiography 09/06/2009    EF 55-60%  . Cardiovascular stress test 04/13/2003    EF 54%. EVIDENCE OF ANTERO-APICAL ISCHEMIA. NORMAL LV SIZE AND FUNCTION    Family History  Problem Relation Age of Onset  . Heart disease Father   . Hypertension Brother   . Stroke Brother     History  Substance Use Topics  . Smoking status: Former Smoker    Quit date: 04/10/1968  . Smokeless tobacco: Former Neurosurgeon    Types: Chew   Comment: DO IT WHEN GET TASTE FOR IT ONCE-TWICE A MONTH  . Alcohol Use: 10.0 oz/week    20 drink(s) per week     ' I quit That "      Review of Systems  Unable to perform ROS: Mental status change  Constitutional: Positive for chills.  Psychiatric/Behavioral: Positive for confusion.    Allergies  Review of patient's allergies indicates no known allergies.  Home Medications   No current outpatient prescriptions on file.  BP 133/57  Pulse 77  Temp(Src) 100.7 F (38.2 C) (Oral)  Resp 20  Ht 6\' 1"  (1.854 m)  Wt 249 lb 1.9 oz (113 kg)  BMI 32.87 kg/m2  SpO2 96%  Physical Exam  Nursing note and vitals reviewed. Constitutional: He appears well-developed and well-nourished. No distress.  HENT:  Head: Normocephalic and atraumatic.       Dry MM  Eyes: EOM are normal. Pupils are equal, round, and reactive to light.  Neck: Normal range of motion. Neck supple.  Cardiovascular: Regular rhythm.   Murmur heard.      tachycardia  Pulmonary/Chest: Effort normal and breath sounds normal. No respiratory distress. He has no wheezes. He has no rales. He exhibits no tenderness.  Abdominal: Soft. Bowel sounds are normal. There is no tenderness. There is no rebound and no guarding.  Musculoskeletal: Normal range of motion. He exhibits no edema and no tenderness.       Dialysis fistula R axillary, + thrill  Neurological:       Pt responsive to painful stimuli. Not following commands. No unilateral deficit appreciated though  difficult to test  Skin: Skin is warm and dry. No rash noted. No erythema.    ED Course  Procedures (including critical care time)  Labs Reviewed  CBC - Abnormal; Notable for the following:    Platelets 142 (*)    All other components within normal limits  DIFFERENTIAL - Abnormal; Notable for the following:    Neutrophils Relative 96 (*)    Lymphocytes Relative 4 (*)    Lymphs Abs 0.2 (*)    Monocytes Relative 1 (*)    Monocytes Absolute 0.0 (*)    All other components within normal limits  COMPREHENSIVE METABOLIC PANEL - Abnormal; Notable for the following:    Chloride 93 (*)    Glucose, Bld 191 (*)    BUN 28 (*)    Creatinine, Ser 6.95 (*)    GFR calc non Af Amer 7 (*)    GFR calc Af Amer 8 (*)    All other components within normal limits  LACTIC ACID, PLASMA - Abnormal; Notable for the following:    Lactic Acid, Venous 2.3 (*)    All other components within normal limits  PROTIME-INR - Abnormal; Notable for the following:    Prothrombin Time 27.7 (*)    INR 2.53 (*)    All other components within normal limits  GLUCOSE, CAPILLARY - Abnormal; Notable for the following:    Glucose-Capillary 109 (*)    All other components within normal limits  CARDIAC PANEL(CRET KIN+CKTOT+MB+TROPI)  PROCALCITONIN  CULTURE, BLOOD (ROUTINE X 2)  CULTURE, BLOOD (ROUTINE X 2)  APTT  MRSA PCR SCREENING  URINALYSIS, ROUTINE W REFLEX MICROSCOPIC  URINE CULTURE  CORTISOL  STOOL CULTURE  CLOSTRIDIUM DIFFICILE CULTURE-FECAL  CBC  PROTIME-INR  COMPREHENSIVE METABOLIC PANEL  MAGNESIUM  PHOSPHORUS  HEPATITIS B SURFACE ANTIGEN  LACTIC ACID, PLASMA   Dg Chest Port 1 View  05/03/2011  *RADIOLOGY REPORT*  Clinical Data: Fever and shortness of breath  PORTABLE CHEST - 1 VIEW  Comparison: 10/29/2010  Findings: Postoperative changes in the mediastinum, stable since previous study.  Shallow inspiration.  Heart size and pulmonary vascularity seem prominent suggesting congestion.  Minimal  infiltration or atelectasis in the lung bases.  Appearance is similar to previous study.  No blunting of costophrenic angles.  No pneumothorax.  IMPRESSION: Cardiac enlargement with mild pulmonary vascular congestion.  Mild infiltration or atelectasis in the lung bases.  Original Report Authenticated By: Marlon Pel, M.D.     1. Community acquired pneumonia   2. Sepsis syndrome   3. Altered mental status   4. Personal history of colonic polyps  MDM   Discussed with [facility]. Will admit to step down bed.       Loren Racer, MD 05/04/11 [id number]

## 2011-05-03 NOTE — Progress Notes (Signed)
CRITICAL VALUE ALERT  Critical value received:  Gram positive cocco in clusters in 2 blood culture sets  Date of notification: 05/03/2011  Time of notification:2000  Critical value read back:yes  Nurse who received alert:  Corena Pilgrim rn  MD notified (1st page): Craige Cotta np   Time of first page: 2000  MD notified (2nd page):  Time of second page:  Responding MD:  Craige Cotta np  Time MD responded:  2002

## 2011-05-03 NOTE — Consult Note (Signed)
Name: Alan [name] MRN: [id number] DOB: 1931-11-04    LOS: 0  [facility] Note   History of Present Illness:  78 y/o M with PMH of DM, HTN, HLD, CKD (began HD in 10/12), Afib on coumadin, CAD s/p CABG, moderate to severe aortic stenosis, chronic dyspnea presented to [facility] ED on 4/25 with AMS.  He was noted to be in usual state of health until 0200 4/24 at which time was noted to have fevers, chills, cough, and increasing shortness of breath. Wife brought patient to ED where he was noted to have temp of 102, Lactic acid of 2.3, PCT 0.81, mild hypotension and questionable infiltrate on CXR.  Noted to have a few episodes of diarrhea in ED.    Lines / Drains: PIV  Cultures: 4/25 BCx2>>> 4/25 UA (ordered per Primary-pt does not make urine regularly but r/o pus)>>> 4/25 UC>>> 4/25 Stool culture>>> 4/25 Stool C-Diff>>>  Antibiotics: Vanco (Sepsis) 4/25>>> Zosyn (Sepsis) 4/25>>>  Tests / Events:    Past Medical History  Diagnosis Date  . Diabetes mellitus   . Gout   . Hypertension   . Hyperlipidemia   . Chronic kidney disease     Started dialysis October 2012  . Arthritis   . Prostate cancer     s/p seed implant  . Cataract     BILATERAL-BEEN REMOVED  . Neuromuscular disorder     DABETIC NEUROPATHY-LOWER EXTREMITY  . Atrial fibrillation     on coumadin  . Aortic stenosis     moderate to severe, not felt to be a surgical candidate  . Anemia     secondary to end stage renal disease  . Coronary artery disease     remote CABG in 1996; last stress test in 2005; last cath 2005  . Diabetic neuropathy   . Edema   . PAD (peripheral artery disease)   . Diabetic retinopathy   . Chronic anticoagulation     on coumadin  . Nephrolithiasis     prior history of percutaneous nephrostomy  . Shortness of breath     Past Surgical History  Procedure Date  . Ventral hernia repair   . Inguinal hernia repair     right side  . Colonoscopy   . Kidney stone  surgery   . Polypectomy   . Tonsillectomy   . Toe surgery     removal little toe right foot  . Cardiac catheterization 04/19/2003    SEVERE 2 VESSEL OBSTRUCTIVE ATHERSCLEROTIC CAD  . Coronary artery bypass graft 1996    LIMA GRAFT TO THE LAD, SEQUENTIAL SAPHENOUS  VEIN GRAFT TO THE FIRST AND SECOND DIAGONIAL BRANCHES, SEQUENTIAL SAPHENOUS VEIN GRAFT TO THE ACUTE MARGINAL, POSTERIOR DESCENDING, AND POSTERIOR LATERAL BRANCHES OF THE RIGHT CORONARY ARTERY  . Radioactive seed implant   . US echocardiography 09/06/2009    EF 55-60%  . Cardiovascular stress test 04/13/2003    EF 54%. EVIDENCE OF ANTERO-APICAL ISCHEMIA. NORMAL LV SIZE AND FUNCTION    Prior to Admission medications   Medication Sig Start Date End Date Taking? Authorizing Provider  allopurinol (ZYLOPRIM) 300 MG tablet Take 150 mg by mouth daily.   Yes Historical Provider, MD  aspirin EC 81 MG tablet Take 81 mg by mouth daily.   Yes Historical Provider, MD  estazolam (PROSOM) 2 MG tablet Take 1 mg by mouth at bedtime as needed. For sleep   Yes Historical Provider, MD  glimepiride (AMARYL) 4 MG tablet Take 2 mg by mouth  daily before breakfast.     Yes Historical Provider, MD  metoprolol tartrate (LOPRESSOR) 25 MG tablet Take 37.5 mg by mouth 2 (two) times daily.    Yes Historical Provider, MD  multivitamin (RENA-VIT) TABS tablet Take 1 tablet by mouth daily.     Yes Historical Provider, MD  traMADol (ULTRAM) 50 MG tablet Take 50 mg by mouth 2 (two) times daily as needed. pain   Yes Historical Provider, MD  warfarin (COUMADIN) 5 MG tablet Take 2.5-5 mg by mouth daily. Takes 2.5mg  daily except 5mg  on Tuesday,  Thursday, and Sunday   Yes Historical Provider, MD    Allergies No Known Allergies  Family History Family History  Problem Relation Age of Onset  . Heart disease Father   . Hypertension Brother   . Stroke Brother     Social History  reports that he quit smoking about 43 years ago. He has quit using smokeless tobacco. His  smokeless tobacco use included Chew. He reports that he drinks about 10 ounces of alcohol per week. He reports that he does not use illicit drugs.  Review Of Systems: See HPI.  All other systems reviewed and negative.       Vital Signs: Filed Vitals:   05/03/11 1430 05/03/11 1458 05/03/11 1500 05/03/11 1530  BP: 106/48 100/50 104/48 102/48  Pulse: 72  50   Temp:  99.1 F (37.3 C)    TempSrc:  Oral    Resp: 22 24 20 19   Weight:      SpO2: 97% 98% 96%      Physical Examination: General: elderly male in NAD Neuro: mild confusion, situationally appropriate but unable to state year.  MAE CV: s1s2 rrr, no m/r/g, SR on monitor currently PULM: respirations even/non-labored on [place], lungs bilaterally coarse GI: obese, soft, bsx4 active Extremities: warm/dry, no edema, RU AV graft with +thirll / bruit, warmth    Labs    CBC  Lab 05/03/11 0322  HGB 13.9  HCT 41.6  WBC 5.2  PLT 142*     BMET  Lab 05/03/11 0322  NA 135  K 4.4  CL 93*  CO2 26  GLUCOSE 191*  BUN 28*  CREATININE 6.95*  CALCIUM 9.1  MG --  PHOS --     Lab 05/03/11 0322  INR 2.53*     Radiology: 4/25 CXR>>>mild congestion, no defined infiltrate, cardiomegaly   Assessment and Plan:  Sepsis Assessment: Mild congestion noted on CXR, no defined infiltrate.  Concern for staph bactremia given HD status.  Hx of valvular heart disease concerning in that setting.  DDx incudes staph bactremia, stool / abd source /graft source.  Cultures drawn after abx.  Patient does not make urine but will assess to r/o pus in bladder.  Plan: -if decompensates, will need TLC - currently BP's stable with acceptable MAP -abx adjusted -PRN tylenol for fever -pan culture -trend PCT -hold trending lactate as he is renal patient -patient is OK for SDU at present -f/u cxr to ensure no developing infiltrate     Other Problems Below per [facility] Atrial fibrillation End stage renal disease on dialysis Healthcare-associated  pneumonia Toxic metabolic encephalopathy    Canary Brim, NP-C [facility] Pgr: [phone]   Seen on CCM rounds this morning with resident MD or ACNP above.  Pt examined and database reviewed. I agree with above findings, assessment and plan as reflected in the note above.   ADDITIONAL THOUGHTS: I doubt PNA based on CXR findings (  or lack thereof). I am more suspicious of a bacteremic process perhaps related to HD. There is some erythema and warmth over the HD fistula.  His current abx therapy is appropriate. There is no indication presently for CVL placement. His BP is adequate. Have spoken with Dr Arlean Hopping and HD is planned but no volume removal.  We will check in on him tomorrow  Billy Fischer, MD;  PCCM service; Mobile [phone]  05/03/2011, 4:17 PM

## 2011-05-03 NOTE — Progress Notes (Signed)
I have seen and examined Alan [name] at the bed side in the presence of Alan [name]. I have also reviewed Alan medical record. He is more with it now but Alan BP has been in the 80's to 90's. He has received at least 1.5 liters of fluid so far. Appreciate renal input. I have consulted Alan [name] to assess if early vasopressors warranted given patient will need HD. Alan [name] will graciously see patient in consult. He will be moving to 2600 as planned unless Alan [name] deems need for higher level of care. Will continue mx per Alan [name]. Also check cortisol level.  Alan Demore,MD pager [phone].

## 2011-05-03 NOTE — ED Notes (Signed)
Phlebotomy at bedside for blood draw.

## 2011-05-03 NOTE — ED Notes (Signed)
Per EMS - pt from home - pt normally A&Ox4 and ambulatory approx midnight pt noted to have altered LOC - on EMS arrival pt nonverbal, pt now verbal however continues to be confused. Equal grip strength for EMS. No facial drooping noted.

## 2011-05-03 NOTE — ED Notes (Signed)
Pt wife leaving to go home to get belongings and will return. Her phone # [phone]

## 2011-05-04 ENCOUNTER — Inpatient Hospital Stay (HOSPITAL_COMMUNITY): Payer: MEDICARE

## 2011-05-04 DIAGNOSIS — R7881 Bacteremia: Secondary | ICD-10-CM | POA: Diagnosis present

## 2011-05-04 DIAGNOSIS — Z7901 Long term (current) use of anticoagulants: Secondary | ICD-10-CM

## 2011-05-04 DIAGNOSIS — A419 Sepsis, unspecified organism: Secondary | ICD-10-CM

## 2011-05-04 DIAGNOSIS — I359 Nonrheumatic aortic valve disorder, unspecified: Secondary | ICD-10-CM

## 2011-05-04 DIAGNOSIS — I959 Hypotension, unspecified: Secondary | ICD-10-CM | POA: Diagnosis present

## 2011-05-04 DIAGNOSIS — E119 Type 2 diabetes mellitus without complications: Secondary | ICD-10-CM | POA: Diagnosis present

## 2011-05-04 DIAGNOSIS — E860 Dehydration: Secondary | ICD-10-CM | POA: Diagnosis present

## 2011-05-04 LAB — PROTIME-INR: Prothrombin Time: 27.7 seconds — ABNORMAL HIGH (ref 11.6–15.2)

## 2011-05-04 LAB — CORTISOL: Cortisol, Plasma: 30.8 ug/dL

## 2011-05-04 LAB — COMPREHENSIVE METABOLIC PANEL
BUN: 20 mg/dL (ref 6–23)
Calcium: 8.5 mg/dL (ref 8.4–10.5)
GFR calc Af Amer: 11 mL/min — ABNORMAL LOW (ref >90)
Glucose, Bld: 63 mg/dL — ABNORMAL LOW (ref 70–99)
Sodium: 138 mEq/L (ref 135–145)
Total Protein: 6.3 g/dL (ref 6.0–8.3)

## 2011-05-04 LAB — CBC
MCV: 91.6 fL (ref 78.0–100.0)
Platelets: 123 10*3/uL — ABNORMAL LOW (ref 150–400)
RDW: 14.3 % (ref 11.5–15.5)
WBC: 12.2 10*3/uL — ABNORMAL HIGH (ref 4.0–10.5)

## 2011-05-04 LAB — HEPATITIS B SURFACE ANTIGEN: Hepatitis B Surface Ag: NEGATIVE

## 2011-05-04 LAB — GLUCOSE, CAPILLARY: Glucose-Capillary: 74 mg/dL (ref 70–99)

## 2011-05-04 LAB — MAGNESIUM: Magnesium: 1.9 mg/dL (ref 1.5–2.5)

## 2011-05-04 MED ORDER — LORAZEPAM 2 MG/ML IJ SOLN
0.5000 mg | Freq: Once | INTRAMUSCULAR | Status: AC
  Administered 2011-05-04: 0.5 mg via INTRAVENOUS

## 2011-05-04 MED ORDER — LORAZEPAM 2 MG/ML IJ SOLN
INTRAMUSCULAR | Status: AC
  Administered 2011-05-04: 0.5 mg
  Filled 2011-05-04: qty 1

## 2011-05-04 MED ORDER — OXYCODONE HCL 5 MG PO TABS
5.0000 mg | ORAL_TABLET | ORAL | Status: DC | PRN
  Administered 2011-05-09 – 2011-05-12 (×4): 5 mg via ORAL
  Filled 2011-05-04 (×2): qty 1

## 2011-05-04 MED ORDER — CALCIUM CARBONATE ANTACID 500 MG PO CHEW
200.0000 mg | CHEWABLE_TABLET | Freq: Three times a day (TID) | ORAL | Status: DC
  Administered 2011-05-04 – 2011-05-12 (×20): 200 mg via ORAL
  Filled 2011-05-04 (×27): qty 1

## 2011-05-04 NOTE — Progress Notes (Signed)
Utilization Review Completed.Alan [name] T4/26/2013   

## 2011-05-04 NOTE — Consult Note (Signed)
Date of Admission:  05/03/2011  Date of Consult:  05/04/2011  Reason for Consult Staphylococcus Aureus Bacteremia in pt on HD Referring Physician: Dr. Mikeal Hawthorne   HPI: Alan [name] is an 78 y.o. male with CAD sp CABG, Aortic stenosis who started HD this fall via a R)UE AVF. Apparently fistual developed stenosis and was assessed by IR earlier this month and he underwent angioplasty on the 8th. He was admitted to [facility] on the 25th with fevers to 102, and confusion. Had mild hypotensive and as well as serve. Chest x-ray was with possible pneumonia and he was started on broad-spectrum antibiotics after cultures were drawn from blood. Since then blood cultures are growing 2 out of 2 gram-positive cocci in clusters consistent with a Staphylococcus aureus species. He has improved clinically and his cognition is also improved greatly after initiation of antibiotics and stay in intensive care unit. Currently his main complaint is loose stools that he is developed the last day. He also does have some chronic lower back pain in the right side. He has no other complaint. We are asked to see this patient to workup his Staphylococcus aureus bacteremia and to help manage this very serious infection. Of note this patient does have moderate aortic stenosis though he has not had valve replacement. Has no prosthetic joints.    Past Medical History  Diagnosis Date  . Diabetes mellitus   . Gout   . Hypertension   . Hyperlipidemia   . Chronic kidney disease     Started dialysis October 2012  . Arthritis   . Prostate cancer     s/p seed implant  . Cataract     BILATERAL-BEEN REMOVED  . Neuromuscular disorder     DABETIC NEUROPATHY-LOWER EXTREMITY  . Atrial fibrillation     on coumadin  . Aortic stenosis     moderate to severe, not felt to be a surgical candidate  . Anemia     secondary to end stage renal disease  . Coronary artery disease     remote CABG in 1996; last stress test in 2005; last cath 2005    . Diabetic neuropathy   . Edema   . PAD (peripheral artery disease)   . Diabetic retinopathy   . Chronic anticoagulation     on coumadin  . Nephrolithiasis     prior history of percutaneous nephrostomy  . Shortness of breath     Past Surgical History  Procedure Date  . Ventral hernia repair   . Inguinal hernia repair     right side  . Colonoscopy   . Kidney stone surgery   . Polypectomy   . Tonsillectomy   . Toe surgery     removal little toe right foot  . Cardiac catheterization 04/19/2003    SEVERE 2 VESSEL OBSTRUCTIVE ATHERSCLEROTIC CAD  . Coronary artery bypass graft 1996    LIMA GRAFT TO THE LAD, SEQUENTIAL SAPHENOUS  VEIN GRAFT TO THE FIRST AND SECOND DIAGONIAL BRANCHES, SEQUENTIAL SAPHENOUS VEIN GRAFT TO THE ACUTE MARGINAL, POSTERIOR DESCENDING, AND POSTERIOR LATERAL BRANCHES OF THE RIGHT CORONARY ARTERY  . Radioactive seed implant   . US echocardiography 09/06/2009    EF 55-60%  . Cardiovascular stress test 04/13/2003    EF 54%. EVIDENCE OF ANTERO-APICAL ISCHEMIA. NORMAL LV SIZE AND FUNCTION  ergies:   No Known Allergies   Medications: I have reviewed patients current medications as documented in Epic Anti-infectives     Start     Dose/Rate Route Frequency Ordered  Stop   05/05/11 1500   levofloxacin (LEVAQUIN) IVPB 500 mg  Status:  Discontinued        500 mg 100 mL/hr over 60 Minutes Intravenous Every 48 hours 05/03/11 1424 05/04/11 0843   05/05/11 1200   vancomycin (VANCOCIN) IVPB 1000 mg/200 mL premix        1,000 mg 200 mL/hr over 60 Minutes Intravenous Every T-Th-Sa (Hemodialysis) 05/03/11 1645     05/03/11 1430   levofloxacin (LEVAQUIN) IVPB 750 mg        750 mg 100 mL/hr over 90 Minutes Intravenous To [facility] 05/03/11 1424 05/03/11 2330   05/03/11 1400   piperacillin-tazobactam (ZOSYN) IVPB 2.25 g  Status:  Discontinued        2.25 g 100 mL/hr over 30 Minutes Intravenous Every 8 hours 05/03/11 1325 05/04/11 0843   05/03/11 1330    vancomycin (VANCOCIN) IVPB 1000 mg/200 mL premix        1,000 mg 200 mL/hr over 60 Minutes Intravenous To [facility] 05/03/11 1325 05/04/11 1330   05/03/11 0330  piperacillin-tazobactam (ZOSYN) IVPB 3.375 g       3.375 g 12.5 mL/hr over 240 Minutes Intravenous  Once 05/03/11 0323 05/03/11 0802   05/03/11 0330   vancomycin (VANCOCIN) IVPB 1000 mg/200 mL premix        1,000 mg 200 mL/hr over 60 Minutes Intravenous  Once 05/03/11 0323 05/03/11 1610          Social History:  reports that he quit smoking about 43 years ago. He has quit using smokeless tobacco. His smokeless tobacco use included Chew. He reports that he drinks about 10 ounces of alcohol per week. He reports that he does not use illicit drugs.  Family History  Problem Relation Age of Onset  . Heart disease Father   . Hypertension Brother   . Stroke Brother     As in HPI and primary teams notes otherwise 12 point review of systems is negative  Blood pressure 119/56, pulse 82, temperature 98.6 F (37 C), temperature source Oral, resp. rate 21, height 6\' 1"  (1.854 m), weight 253 lb 4.9 oz (114.9 kg), SpO2 99.00%. General: Alert and awake, oriented x3, not in any acute distress. HEENT: anicteric sclera, pupils reactive to light and accommodation, EOMI, oropharynx clear and without exudate CVS regular rate, normal r,  Iii/vi high pitched murmru radiating to the carotids,  Chest: fairly clear to auscultation bilaterally, no wheezing, rales or rhonchi Abdomen: soft nontender, nondistended, normal bowel sounds, Extremities: no  clubbing or edema noted bilaterally Skin: fistula site is without overt purulence or fluctuance Neuro: nonfocal, strength and sensation intact   Results for orders placed during the hospital encounter of 05/03/11 (from the past 48 hour(s))  CBC     Status: Abnormal   Collection Time   05/03/11  3:22 AM      Component Value Range Comment   WBC 5.2  4.0 - 10.5 (K/uL)    RBC 4.51  4.22 -  5.81 (MIL/uL)    Hemoglobin 13.9  13.0 - 17.0 (g/dL)    HCT 96.0  45.4 - 09.8 (%)    MCV 92.2  78.0 - 100.0 (fL)    MCH 30.8  26.0 - 34.0 (pg)    MCHC 33.4  30.0 - 36.0 (g/dL)    RDW 11.9  14.7 - 82.9 (%)    Platelets 142 (*) 150 - 400 (K/uL)   DIFFERENTIAL     Status: Abnormal  Collection Time   05/03/11  3:22 AM      Component Value Range Comment   Neutrophils Relative 96 (*) 43 - 77 (%)    Neutro Abs 5.0  1.7 - 7.7 (K/uL)    Lymphocytes Relative 4 (*) 12 - 46 (%)    Lymphs Abs 0.2 (*) 0.7 - 4.0 (K/uL)    Monocytes Relative 1 (*) 3 - 12 (%)    Monocytes Absolute 0.0 (*) 0.1 - 1.0 (K/uL)    Eosinophils Relative 0  0 - 5 (%)    Eosinophils Absolute 0.0  0.0 - 0.7 (K/uL)    Basophils Relative 0  0 - 1 (%)    Basophils Absolute 0.0  0.0 - 0.1 (K/uL)   COMPREHENSIVE METABOLIC PANEL     Status: Abnormal   Collection Time   05/03/11  3:22 AM      Component Value Range Comment   Sodium 135  135 - 145 (mEq/L)    Potassium 4.4  3.5 - 5.1 (mEq/L)    Chloride 93 (*) 96 - 112 (mEq/L)    CO2 26  19 - 32 (mEq/L)    Glucose, Bld 191 (*) 70 - 99 (mg/dL)    BUN 28 (*) 6 - 23 (mg/dL)    Creatinine, Ser 4.54 (*) 0.50 - 1.35 (mg/dL)    Calcium 9.1  8.4 - 10.5 (mg/dL)    Total Protein 7.4  6.0 - 8.3 (g/dL)    Albumin 3.7  3.5 - 5.2 (g/dL)    AST 28  0 - 37 (U/L)    ALT 18  0 - 53 (U/L)    Alkaline Phosphatase 83  39 - 117 (U/L)    Total Bilirubin 0.9  0.3 - 1.2 (mg/dL)    GFR calc non Af Amer 7 (*) >90 (mL/min)    GFR calc Af Amer 8 (*) >90 (mL/min)   PROCALCITONIN     Status: Normal   Collection Time   05/03/11  3:22 AM      Component Value Range Comment   Procalcitonin 0.81     PROTIME-INR     Status: Abnormal   Collection Time   05/03/11  3:22 AM      Component Value Range Comment   Prothrombin Time 27.7 (*) 11.6 - 15.2 (seconds)    INR 2.53 (*) 0.00 - 1.49    APTT     Status: Normal   Collection Time   05/03/11  3:22 AM      Component Value Range Comment   aPTT 33  24 - 37  (seconds)   CARDIAC PANEL(CRET KIN+CKTOT+MB+TROPI)     Status: Normal   Collection Time   05/03/11  3:25 AM      Component Value Range Comment   Total CK 104  7 - 232 (U/L)    CK, MB 1.2  0.3 - 4.0 (ng/mL)    Troponin I <0.30  <0.30 (ng/mL)    Relative Index 1.2  0.0 - 2.5    LACTIC ACID, PLASMA     Status: Abnormal   Collection Time   05/03/11  3:25 AM      Component Value Range Comment   Lactic Acid, Venous 2.3 (*) 0.5 - 2.2 (mmol/L)   CULTURE, BLOOD (ROUTINE X 2)     Status: Normal (Preliminary result)   Collection Time   05/03/11  3:30 AM      Component Value Range Comment   Specimen Description BLOOD LEFT ARM  Special Requests BOTTLES DRAWN AEROBIC AND ANAEROBIC [facility] EACH      Culture  Setup Time [id number]      Culture        Value: GRAM POSITIVE COCCI IN CLUSTERS     Note: Gram Stain Report Called to,Read Back By and Verified With: HILDA MOOSE @ 1957 ON 05/03/2011 HAJAM   Report Status PENDING     CULTURE, BLOOD (ROUTINE X 2)     Status: Normal (Preliminary result)   Collection Time   05/03/11  3:45 AM      Component Value Range Comment   Specimen Description BLOOD LEFT HAND      Special Requests BOTTLES DRAWN AEROBIC AND ANAEROBIC 5CC EACH      Culture  Setup Time [id number]      Culture        Value: GRAM POSITIVE COCCI IN CLUSTERS     Note: Gram Stain Report Called to,Read Back By and Verified With: HILDA MOOSE @ 1957 ON 05/03/2011 HAJAM   Report Status PENDING     GLUCOSE, CAPILLARY     Status: Abnormal   Collection Time   05/03/11 11:07 AM      Component Value Range Comment   Glucose-Capillary 109 (*) 70 - 99 (mg/dL)   CORTISOL     Status: Normal   Collection Time   05/03/11  4:57 PM      Component Value Range Comment   Cortisol, Plasma 30.8     MRSA PCR SCREENING     Status: Normal   Collection Time   05/03/11  5:24 PM      Component Value Range Comment   MRSA by PCR NEGATIVE  NEGATIVE    HEPATITIS B SURFACE ANTIGEN     Status: Normal   Collection Time    05/03/11  9:51 PM      Component Value Range Comment   Hepatitis B Surface Ag NEGATIVE  NEGATIVE    LACTIC ACID, PLASMA     Status: Normal   Collection Time   05/04/11 12:01 AM      Component Value Range Comment   Lactic Acid, Venous 1.5  0.5 - 2.2 (mmol/L)   CBC     Status: Abnormal   Collection Time   05/04/11  3:55 AM      Component Value Range Comment   WBC 12.2 (*) 4.0 - 10.5 (K/uL)    RBC 4.15 (*) 4.22 - 5.81 (MIL/uL)    Hemoglobin 12.6 (*) 13.0 - 17.0 (g/dL)    HCT 86.5 (*) 78.4 - 52.0 (%)    MCV 91.6  78.0 - 100.0 (fL)    MCH 30.4  26.0 - 34.0 (pg)    MCHC 33.2  30.0 - 36.0 (g/dL)    RDW 69.6  29.5 - 28.4 (%)    Platelets 123 (*) 150 - 400 (K/uL)   PROTIME-INR     Status: Abnormal   Collection Time   05/04/11  3:55 AM      Component Value Range Comment   Prothrombin Time 27.7 (*) 11.6 - 15.2 (seconds)    INR 2.53 (*) 0.00 - 1.49    COMPREHENSIVE METABOLIC PANEL     Status: Abnormal   Collection Time   05/04/11  3:55 AM      Component Value Range Comment   Sodium 138  135 - 145 (mEq/L)    Potassium 3.7  3.5 - 5.1 (mEq/L)    Chloride 99  96 - 112 (mEq/L)  CO2 30  19 - 32 (mEq/L)    Glucose, Bld 63 (*) 70 - 99 (mg/dL)    BUN 20  6 - 23 (mg/dL)    Creatinine, Ser 0.96 (*) 0.50 - 1.35 (mg/dL)    Calcium 8.5  8.4 - 10.5 (mg/dL)    Total Protein 6.3  6.0 - 8.3 (g/dL)    Albumin 2.8 (*) 3.5 - 5.2 (g/dL)    AST 39 (*) 0 - 37 (U/L)    ALT 19  0 - 53 (U/L)    Alkaline Phosphatase 67  39 - 117 (U/L)    Total Bilirubin 0.8  0.3 - 1.2 (mg/dL)    GFR calc non Af Amer 10 (*) >90 (mL/min)    GFR calc Af Amer 11 (*) >90 (mL/min)   MAGNESIUM     Status: Normal   Collection Time   05/04/11  3:55 AM      Component Value Range Comment   Magnesium 1.9  1.5 - 2.5 (mg/dL)   PHOSPHORUS     Status: Normal   Collection Time   05/04/11  3:55 AM      Component Value Range Comment   Phosphorus 2.4  2.3 - 4.6 (mg/dL)   GLUCOSE, CAPILLARY     Status: Abnormal   Collection Time    05/04/11  8:12 AM      Component Value Range Comment   Glucose-Capillary 47 (*) 70 - 99 (mg/dL)    Comment 1 Notify RN     GLUCOSE, CAPILLARY     Status: Normal   Collection Time   05/04/11 12:17 PM      Component Value Range Comment   Glucose-Capillary 74  70 - 99 (mg/dL)    Comment 1 Notify RN         Component Value Date/Time   SDES BLOOD LEFT HAND 05/03/2011 0345   SPECREQUEST BOTTLES DRAWN AEROBIC AND ANAEROBIC 5CC EACH 05/03/2011 0345   CULT  Value: GRAM POSITIVE COCCI IN CLUSTERS Note: Gram Stain Report Called to,Read Back By and Verified With: HILDA MOOSE @ 1957 ON 05/03/2011 HAJAM 05/03/2011 0345   REPTSTATUS PENDING 05/03/2011 0345   Dg Chest Port 1 View  05/04/2011  *RADIOLOGY REPORT*  Clinical Data: Evaluate for infiltrate  PORTABLE CHEST - 1 VIEW  Comparison: 05/03/2011  Findings: Mild left basilar opacity, possibly atelectasis. No pleural effusion or pneumothorax.  Stable cardiomegaly. Postsurgical changes related to prior CABG.  IMPRESSION: Mild left basilar opacity, possibly atelectasis.  Stable cardiomegaly.  Original Report Authenticated By: Charline Bills, M.D.   Dg Chest Port 1 View  05/03/2011  *RADIOLOGY REPORT*  Clinical Data: Fever and shortness of breath  PORTABLE CHEST - 1 VIEW  Comparison: 10/29/2010  Findings: Postoperative changes in the mediastinum, stable since previous study.  Shallow inspiration.  Heart size and pulmonary vascularity seem prominent suggesting congestion.  Minimal infiltration or atelectasis in the lung bases.  Appearance is similar to previous study.  No blunting of costophrenic angles.  No pneumothorax.  IMPRESSION: Cardiac enlargement with mild pulmonary vascular congestion.  Mild infiltration or atelectasis in the lung bases.  Original Report Authenticated By: Marlon Pel, M.D.     Recent Results (from the past 720 hour(s))  CULTURE, BLOOD (ROUTINE X 2)     Status: Normal (Preliminary result)   Collection Time   05/03/11  3:30 AM       Component Value Range Status Comment   Specimen Description BLOOD LEFT ARM   Final  Special Requests BOTTLES DRAWN AEROBIC AND ANAEROBIC [facility] EACH   Final    Culture  Setup Time [id number]   Final    Culture     Final    Value: GRAM POSITIVE COCCI IN CLUSTERS     Note: Gram Stain Report Called to,Read Back By and Verified With: HILDA MOOSE @ 1957 ON 05/03/2011 HAJAM   Report Status PENDING   Incomplete   CULTURE, BLOOD (ROUTINE X 2)     Status: Normal (Preliminary result)   Collection Time   05/03/11  3:45 AM      Component Value Range Status Comment   Specimen Description BLOOD LEFT HAND   Final    Special Requests BOTTLES DRAWN AEROBIC AND ANAEROBIC [facility] EACH   Final    Culture  Setup Time [id number]   Final    Culture     Final    Value: GRAM POSITIVE COCCI IN CLUSTERS     Note: Gram Stain Report Called to,Read Back By and Verified With: HILDA MOOSE @ 1957 ON 05/03/2011 HAJAM   Report Status PENDING   Incomplete   MRSA PCR SCREENING     Status: Normal   Collection Time   05/03/11  5:24 PM      Component Value Range Status Comment   MRSA by PCR NEGATIVE  NEGATIVE  Final      Impression/Recommendation  78 year old man with aortic stenosis, end-stage renal disease on hemodialysis via AV fistula and right upper extremity now with Staphylococcus aureus bacteremia  1) Staphylococcus aureus bacteremia: --Continue vancomycin --Repeat blood cultures to ensure clearance have been sent --Patient needs a transesophageal echocardiogram done to assess his heart valves. --I see no evidence that the AV fistula is overtly infected but we certainly worry about in this patient with Staphylococcus aureus bacteremia.  #2 diarrhea: Likely due to his acute infection but would like to rule out C. difficile colitis with C. difficile PCR.  #3 screening check HIV.   Thank you so much for this interesting consult,   Acey Lav 05/04/2011, 3:23 PM   [phone] (pager) [phone]  (office)

## 2011-05-04 NOTE — Consult Note (Signed)
Name: Alan [name] MRN: [id number] DOB: [birth year]    LOS: 1  [facility] Critical Care Note   History of Present Illness:  78 y/o M with PMH of DM, HTN, HLD, CKD (began HD in 10/12), Afib on coumadin, CAD s/p CABG, moderate to severe aortic stenosis, chronic dyspnea presented to [facility] ED on 4/25 with AMS.  He was noted to be in usual state of health until 0200 4/24 at which time was noted to have fevers, chills, cough, and increasing shortness of breath. Wife brought patient to ED where he was noted to have temp of 102, Lactic acid of 2.3, PCT 0.81, mild hypotension and questionable infiltrate on CXR.  Noted to have a few episodes of diarrhea in ED.    Lines / Drains: PIV  Cultures: 4/25 BCx2>>> 2/2 GPC clusters >>  4/25 Stool culture>>> 4/25 Stool C-Diff>>>  Antibiotics: Vanco (Sepsis) 4/25>>> Zosyn (Sepsis) 4/25>>> 4/26  Tests / Events:   SUBJ: Looks much improved. Cognition intact  Vital Signs: Filed Vitals:   05/04/11 0800 05/04/11 0808 05/04/11 0900 05/04/11 1021  BP: 111/49 111/49 121/49 119/56  Pulse: 80 81 80 82  Temp:  98.6 F (37 C)    TempSrc:  Oral    Resp: 19 20 21    Height:      Weight:      SpO2: 98% 98% 99%      Physical Examination: General: elderly male in NAD Neuro: mild confusion resolved, situationally appropriate but unable to state year.  MAE CV: s1s2 rrr, no m/r/g, SR on monitor currently PULM: respirations even/non-labored on [place], lungs bilaterally clear GI: obese, soft, bsx4 active Extremities: warm/dry, no edema, RU AV graft with +thirll / bruit, warmth    Labs    CBC  Lab 05/04/11 0355 05/03/11 0322  HGB 12.6* 13.9  HCT 38.0* 41.6  WBC 12.2* 5.2  PLT 123* 142*     BMET  Lab 05/04/11 0355 05/03/11 0322  NA 138 135  K 3.7 4.4  CL 99 93*  CO2 30 26  GLUCOSE 63* 191*  BUN 20 28*  CREATININE 5.16* 6.95*  CALCIUM 8.5 9.1  MG 1.9 --  PHOS 2.4 --     Lab 05/04/11 0355 05/03/11 0322  INR 2.53* 2.53*      Radiology: 4/25 CXR>>>NSC   Assessment and Plan:  Sepsis with hypotension - resolved  GPC bacteremia - likely staph aureus and related to HD. Strongly doubt PNA. Concerning given hx of aortic valve stenosis. High risk of MRSA given extensive health care exposure. - Cont Vancomycin - Encourage ID consultation to direct further eval and address duration of therapy.  - Dr Arlean Hopping has ordered MRI spine to r/o discitis in light of recent back pain      PCCM will sign off. Please call if we can be of further assistance. He appears sufficiently stable for transfer to Renal floor.   Billy Fischer, MD;  PCCM service; Mobile [phone]

## 2011-05-04 NOTE — Progress Notes (Signed)
Subjective:  Feeling a little better. BC + GPC clusters 2/2. C/O back pain x months, mid lower back, wife asking for xrays, has not been xrayed per pt.   Objective:    Vital signs in last 24 hours: Filed Vitals:   05/04/11 0800 05/04/11 0808 05/04/11 0900 05/04/11 1021  BP: 111/49 111/49 121/49 119/56  Pulse: 80 81 80 82  Temp:  98.6 F (37 C)    TempSrc:  Oral    Resp: 19 20 21    Height:      Weight:      SpO2: 98% 98% 99%    Weight change:   Intake/Output Summary (Last 24 hours) at 05/04/11 1043 Last data filed at 05/04/11 1000  Gross per 24 hour  Intake  557.5 ml  Output    100 ml  Net  457.5 ml   Labs: Basic Metabolic Panel:  Lab 05/04/11 4098 05/03/11 0322  NA 138 135  K 3.7 4.4  CL 99 93*  CO2 30 26  GLUCOSE 63* 191*  BUN 20 28*  CREATININE 5.16* 6.95*  ALB -- --  CALCIUM 8.5 9.1  PHOS 2.4 --   Liver Function Tests:  Lab 05/04/11 0355 05/03/11 0322  AST 39* 28  ALT 19 18  ALKPHOS 67 83  BILITOT 0.8 0.9  PROT 6.3 7.4  ALBUMIN 2.8* 3.7   No results found for this basename: LIPASE:3,AMYLASE:3 in the last 168 hours No results found for this basename: AMMONIA:3 in the last 168 hours CBC:  Lab 05/04/11 0355 05/03/11 0322  WBC 12.2* 5.2  NEUTROABS -- 5.0  HGB 12.6* 13.9  HCT 38.0* 41.6  MCV 91.6 92.2  PLT 123* 142*   Cardiac Enzymes:  Lab 05/03/11 0325  CKTOTAL 104  CKMB 1.2  CKMBINDEX --  TROPONINI <0.30   CBG:  Lab 05/04/11 0812 05/03/11 1107  GLUCAP 47* 109*    Iron Studies: No results found for this basename: IRON:30,TIBC:30,SATURATION RATIOS:30,TRANSFERRIN:30,FERRITIN:30 in the last 168 hours Studies/Results: Dg Chest Port 1 View  05/04/2011  *RADIOLOGY REPORT*  Clinical Data: Evaluate for infiltrate  PORTABLE CHEST - 1 VIEW  Comparison: 05/03/2011  Findings: Mild left basilar opacity, possibly atelectasis. No pleural effusion or pneumothorax.  Stable cardiomegaly. Postsurgical changes related to prior CABG.  IMPRESSION: Mild left  basilar opacity, possibly atelectasis.  Stable cardiomegaly.  Original Report Authenticated By: Charline Bills, M.D.   Dg Chest Port 1 View  05/03/2011  *RADIOLOGY REPORT*  Clinical Data: Fever and shortness of breath  PORTABLE CHEST - 1 VIEW  Comparison: 10/29/2010  Findings: Postoperative changes in the mediastinum, stable since previous study.  Shallow inspiration.  Heart size and pulmonary vascularity seem prominent suggesting congestion.  Minimal infiltration or atelectasis in the lung bases.  Appearance is similar to previous study.  No blunting of costophrenic angles.  No pneumothorax.  IMPRESSION: Cardiac enlargement with mild pulmonary vascular congestion.  Mild infiltration or atelectasis in the lung bases.  Original Report Authenticated By: Marlon Pel, M.D.   Medications:    . sodium chloride    . DISCONTD: sodium chloride        . allopurinol  150 mg Oral Daily  . aspirin EC  81 mg Oral Daily  . calcium carbonate  3 tablet Oral TID WC  . ezetimibe-simvastatin  1 tablet Oral QHS  . ferrous fumarate  1 tablet Oral BID  . levofloxacin (LEVAQUIN) IV  750 mg Intravenous To Major  . metoprolol tartrate  37.5 mg Oral BID  .  multivitamin  1 tablet Oral Daily  . sodium chloride  500 mL Intravenous Once  . sodium chloride  3 mL Intravenous Q12H  . temazepam  15 mg Oral QHS  . vancomycin  1,000 mg Intravenous To Major  . vancomycin  1,000 mg Intravenous Q T,Th,Sa-HD  . warfarin  2.5 mg Oral Custom  . warfarin  5 mg Oral Custom  . Warfarin - Pharmacist Dosing Inpatient   Does not apply q1800  . DISCONTD: glimepiride  2 mg Oral QAC breakfast  . DISCONTD: levofloxacin (LEVAQUIN) IV  500 mg Intravenous Q48H  . DISCONTD: piperacillin-tazobactam (ZOSYN)  IV  2.25 g Intravenous Q8H    I  have reviewed scheduled and prn medications.  Physical Exam:  Blood pressure 119/56, pulse 82, temperature 98.6 F (37 C), temperature source Oral, resp. rate 21, height 6\' 1"  (1.854 m),  weight 114.9 kg (253 lb 4.9 oz), SpO2 99.00%.  General: weak, no distress, alert Neck: Supple. JVD not elevated; no LAD  Lungs: Clear bilaterally to auscultation without wheezes, rales, or rhonchi. Breathing is unlabored.  Heart: irreg II/VI murmur  Abdomen: Obese, soft, non-tender, non-distended with normoactive bowel sounds. No rebound/guarding  M-S: Strength and tone appear normal for age.  Lower extremities: tr lower edema; no ischemic changes, no open wounds; left heal thickened skin, like only healed heal ulcer; right fifth toe missing - remote amputation.  Neuro: alert, oriented x 3 Dialysis Access: right upper AVF + bruit, mild erythema gone, less warm over AVF today  Dialysis Orders: Center: [facility] On TTS EDW 111 (left at 110.7 4/23) HD Bath 2K 2.2 5 Ca Time 4.25Heparin 3400 Access right upper AVF BFR 500 DFR A 1.5 Zemplar IV/HD Epogen none Venofer 100 q week  Assessment/Plan  1. Fever / gram + bacteremia - on IV levaquin , zosyn and Vanc. + mid lower back pain for months, will do MRI of LS spine r/o discitis. AVF not grossly infected but was slightly red yest on admission and warm. Await BC results, continue abx.  2. ESRD - TTS - HD tomorrow, weight up 3 kg over EDW, remove 2 kg tomorrow; had PTA of AVF on 4/8 3. Hypertension/volume - hypotension improved. Home meds are metoprolol 37.5 bid- hold sbp less than 120 for now.  4. Anemia - Hgb 13.9 - not on ESA; hold IV Fe for now 5. Metabolic bone disease - Continue binders (Tums 500 one ac tid) and zemplar 2ug/HD 6. Nutrition - high protein renal diet 7. Atrial fibrillation - Rate controlled on BB-INR therapeutic; pharmacy to dose 8. Diabetes - per primary 9. Hx gout - continue allopurinol 150/d   Vinson Moselle  MD [facility] [phone] pgr    [phone] cell 05/04/2011, 10:43 AM

## 2011-05-04 NOTE — Progress Notes (Addendum)
[facility]   Subjective: Sitting up in bed without any specific complaints. Mildly confused and seems to have issues with short-term memory. States had one episode of diarrhea since admission but did not have any diarrhea or abdominal pain prior to admission.  Objective: Vital signs in last 24 hours: Temp:  [97.5 F (36.4 C)-101.3 F (38.5 C)] 98.6 F (37 C) (04/26 0808) Pulse Rate:  [50-82] 82  (04/26 1021) Resp:  [17-28] 21  (04/26 0900) BP: (87-143)/(37-67) 119/56 mmHg (04/26 1021) SpO2:  [93 %-100 %] 99 % (04/26 0900) FiO2 (%):  [3 %] 3 % (04/25 1750) Weight:  [111.131 kg (245 lb)-114.9 kg (253 lb 4.9 oz)] 114.9 kg (253 lb 4.9 oz) (04/26 0000) Weight change:  Last BM Date: 05/04/11  Intake/Output from previous day: 04/25 0701 - 04/26 0700 In: 532.5 [P.O.:120; I.V.:150; IV Piggyback:262.5] Out: 100  Intake/Output this shift: Total I/O In: 25 [IV Piggyback:25] Out: -   General appearance: alert, cooperative, appears older than stated age and no distress Resp: clear to auscultation bilaterally, room air with saturations 98-9 & Cardio: regular rate and rhythm although in the past 24 hours has been anywhere between atrial fibrillation sinus rhythm and first-degree AV block., S1, S2 normal, no murmur, click, rub or gallop GI: soft, non-tender; bowel sounds normal; no masses,  no organomegaly Extremities: extremities normal, atraumatic, no cyanosis or edema Neurologic: Grossly normal  Lab Results:  Basename 05/04/11 0355 05/03/11 0322  WBC 12.2* 5.2  HGB 12.6* 13.9  HCT 38.0* 41.6  PLT 123* 142*   BMET  Basename 05/04/11 0355 05/03/11 0322  NA 138 135  K 3.7 4.4  CL 99 93*  CO2 30 26  GLUCOSE 63* 191*  BUN 20 28*  CREATININE 5.16* 6.95*  CALCIUM 8.5 9.1    Studies/Results: Dg Chest Port 1 View  05/04/2011  *RADIOLOGY REPORT*  Clinical Data: Evaluate for infiltrate  PORTABLE CHEST - 1 VIEW  Comparison: 05/03/2011  Findings: Mild left basilar opacity,  possibly atelectasis. No pleural effusion or pneumothorax.  Stable cardiomegaly. Postsurgical changes related to prior CABG.  IMPRESSION: Mild left basilar opacity, possibly atelectasis.  Stable cardiomegaly.  Original Report Authenticated By: Charline Bills, M.D.   Dg Chest Port 1 View  05/03/2011  *RADIOLOGY REPORT*  Clinical Data: Fever and shortness of breath  PORTABLE CHEST - 1 VIEW  Comparison: 10/29/2010  Findings: Postoperative changes in the mediastinum, stable since previous study.  Shallow inspiration.  Heart size and pulmonary vascularity seem prominent suggesting congestion.  Minimal infiltration or atelectasis in the lung bases.  Appearance is similar to previous study.  No blunting of costophrenic angles.  No pneumothorax.  IMPRESSION: Cardiac enlargement with mild pulmonary vascular congestion.  Mild infiltration or atelectasis in the lung bases.  Original Report Authenticated By: Marlon Pel, M.D.    Medications: I have reviewed the patient's current medications.  Assessment/Plan:  Principal Problem:  *Sepsis *Currently hemodynamically stable *Treat underlying causes  Active Problems:  Bacteremia due to Gram-positive bacteria *Likely source from dialysis access *No signs of pneumonia or urinary tract infection *2-D echo pending to rule out endocarditis *I have consulted infectious disease service *We have narrowed antibiotic coverage: DC Zosyn and Levaquin and continue vancomycin. Dr. Algis Liming with infectious disease states patient may likely need the addition of rifampin   Dehydration/Hypotension *Blood pressure stable but we'll continue IV fluids for now   Atrial fibrillation *Rhythm has been variable between sinus rhythm and nitroglycerin fibrillation but rate is controlled *Continue low-dose  metoprolol   Healthcare-associated pneumonia *Clinical exam not consistent with pneumonia or other pulmonary infection   Toxic metabolic encephalopathy *Resolved with  treatment of acute infection and hypotension   Diabetes mellitus with hypoglycemia *CBGs have been as low as in the 40s since admission and has received concentrated dextrose IV *We'll discontinue Amaryl *Begin sliding scale insulin coverage as needed-we'll check CBGs every hour until CBGs consistently greater than 100   Chronic anticoagulation *INR is therapeutic *Pharmacy dosing Coumadin   Aortic stenosis *Significant risk factor for endocarditis in setting of patient with gram-positive bacteremia *2-D echocardiogram pending but may need TEE   CAD (coronary artery disease) *Quiescent   End stage renal disease on dialysis *Nephrology following-dialyzes on Tuesdays Thursdays and Saturdays  Disposition *Remain in stepdown    LOS: 1 day   Junious Silk, ANP pager [phone]  [facility] [email or web address] Password: [email or web address]  05/04/2011, 12:57 PM  I have examined the patient and reviewed the chart. I agree with the above note.   Calvert Cantor, MD [phone]

## 2011-05-04 NOTE — Progress Notes (Signed)
ANTICOAGULATION and ANTIBIOTIC CONSULT NOTE - Initial Consult  Pharmacy Consult for Coumadin, vancomycin Indication: chronic Afib; GPC bacteremia  No Known Allergies  Patient Measurements: weight 245 lbs   Vital Signs: Temp: 98.6 F (37 C) (04/26 0808) Temp src: Oral (04/26 0808) BP: 121/49 mmHg (04/26 0900) Pulse Rate: 80  (04/26 0900)  Labs:  Basename 05/04/11 0355 05/03/11 0325 05/03/11 0322  HGB 12.6* -- 13.9  HCT 38.0* -- 41.6  PLT 123* -- 142*  APTT -- -- 33  LABPROT 27.7* -- 27.7*  INR 2.53* -- 2.53*  HEPARINUNFRC -- -- --  CREATININE 5.16* -- 6.95*  CKTOTAL -- 104 --  CKMB -- 1.2 --  TROPONINI -- <0.30 --   Estimated Creatinine Clearance: 15.9 ml/min (by C-G formula based on Cr of 5.16).  Medical History: Past Medical History  Diagnosis Date  . Diabetes mellitus   . Gout   . Hypertension   . Hyperlipidemia   . Chronic kidney disease     Started dialysis October 2012  . Arthritis   . Prostate cancer     s/p seed implant  . Cataract     BILATERAL-BEEN REMOVED  . Neuromuscular disorder     DABETIC NEUROPATHY-LOWER EXTREMITY  . Atrial fibrillation     on coumadin  . Aortic stenosis     moderate to severe, not felt to be a surgical candidate  . Anemia     secondary to end stage renal disease  . Coronary artery disease     remote CABG in 1996; last stress test in 2005; last cath 2005  . Diabetic neuropathy   . Edema   . PAD (peripheral artery disease)   . Diabetic retinopathy   . Chronic anticoagulation     on coumadin  . Nephrolithiasis     prior history of percutaneous nephrostomy  . Shortness of breath     Medications:  See med rec  Assessment: Patient is a 78 y.o M with ESRD (regular HD schedule TTS) presented to the ED with c/o AMS.   Wife stated that he didn't go to his scheduled dialysis session because he was admited to the ED. Both blood cultures are showing GPC so likely cath related bacteremia.    He was on coumadin PTA for hx  Afib.  Home coumadin dose 2.5mg  daily except 5mg  on Tue, Thurs, and Sundays (verified with Coumadin clinic).  Pt's wife reported that the last dose he took was on on 05/02/11. INR is therapeutic today at 2.53.  Goal of Therapy:  INR 2-3; pre-HD vancomycin level= 15-25   Plan:  1) Vanc 1g IV qHD. May need an additional dose today if he goes to HD 3) Coumadin 2.5mg  daily except 5mg  on Tues, Thurs, Sun

## 2011-05-04 NOTE — Progress Notes (Signed)
[facility] PCCM Cross Cover Note  I was asked by Pola Corn to see Alan [name] for hypotension.  Upon my arrival he was conversant and explained to me that his appetite had been poor and he had had diarrhea.  Further he had just returned from a longer than usual dialysis session during which time he was kept net even from a volume standpoint.  He did not appear septic based on his normal vital signs but slightly low blood pressure.  His lungs were clear.  We added a repeat lactate for reassurance and ordered a small bolus of NS.    Blood cultures are positive for GPC's.  He will need a TEE and repeat blood cultures.  Will defer timing of both to primary team.  Yolonda Kida PCCM Pager: [phone] Cell: [phone] If no response, call [phone]

## 2011-05-04 NOTE — Progress Notes (Signed)
  Echocardiogram 2D Echocardiogram has been performed.  Alan [name] 05/04/2011, 4:17 PM

## 2011-05-05 ENCOUNTER — Inpatient Hospital Stay (HOSPITAL_COMMUNITY): Payer: MEDICARE

## 2011-05-05 LAB — CBC
HCT: 38.6 % — ABNORMAL LOW (ref 39.0–52.0)
Hemoglobin: 12.7 g/dL — ABNORMAL LOW (ref 13.0–17.0)
MCH: 30.4 pg (ref 26.0–34.0)
MCHC: 32.9 g/dL (ref 30.0–36.0)
RBC: 4.18 MIL/uL — ABNORMAL LOW (ref 4.22–5.81)

## 2011-05-05 LAB — BASIC METABOLIC PANEL
BUN: 33 mg/dL — ABNORMAL HIGH (ref 6–23)
CO2: 27 mEq/L (ref 19–32)
Calcium: 8.8 mg/dL (ref 8.4–10.5)
GFR calc non Af Amer: 7 mL/min — ABNORMAL LOW (ref >90)
Glucose, Bld: 96 mg/dL (ref 70–99)
Potassium: 3.9 mEq/L (ref 3.5–5.1)
Sodium: 136 mEq/L (ref 135–145)

## 2011-05-05 LAB — GLUCOSE, CAPILLARY
Glucose-Capillary: 44 mg/dL — CL (ref 70–99)
Glucose-Capillary: 51 mg/dL — ABNORMAL LOW (ref 70–99)
Glucose-Capillary: 57 mg/dL — ABNORMAL LOW (ref 70–99)
Glucose-Capillary: 162 mg/dL — ABNORMAL HIGH (ref 70–99)
Glucose-Capillary: 129 mg/dL — ABNORMAL HIGH (ref 70–99)

## 2011-05-05 LAB — PROTIME-INR
INR: 2.34 — ABNORMAL HIGH (ref 0.00–1.49)
Prothrombin Time: 26 seconds — ABNORMAL HIGH (ref 11.6–15.2)

## 2011-05-05 MED ORDER — GLUCOSE 40 % PO GEL
1.0000 | ORAL | Status: DC | PRN
  Administered 2011-05-05: 37.5 g via ORAL

## 2011-05-05 MED ORDER — GLUCOSE 40 % PO GEL
ORAL | Status: AC
  Administered 2011-05-05: 37.5 g via ORAL
  Filled 2011-05-05: qty 1

## 2011-05-05 MED ORDER — DEXTROSE 50 % IV SOLN
25.0000 mL | Freq: Once | INTRAVENOUS | Status: AC | PRN
  Administered 2011-05-05: 25 mL via INTRAVENOUS

## 2011-05-05 MED ORDER — DEXTROSE 50 % IV SOLN
INTRAVENOUS | Status: AC
  Administered 2011-05-05: 25 mL via INTRAVENOUS
  Filled 2011-05-05: qty 50

## 2011-05-05 NOTE — Progress Notes (Signed)
Subjective:  Feeling a little better. BC + GPC clusters 2/2. C/O back pain x months, mid lower back, wife asking for xrays, has not been xrayed per pt.   Objective:    Vital signs in last 24 hours: Filed Vitals:   05/05/11 0800 05/05/11 0900 05/05/11 1000 05/05/11 1100  BP: 127/96 137/78 131/62 135/60  Pulse: 81 79 81 82  Temp:      TempSrc:      Resp: 19 19 19 20   Height:      Weight:      SpO2: 98% 98% 97% 97%   Weight change: 3.869 kg (8 lb 8.5 oz)  Intake/Output Summary (Last 24 hours) at 05/05/11 1229 Last data filed at 05/05/11 1000  Gross per 24 hour  Intake   1000 ml  Output      0 ml  Net   1000 ml   Labs: Basic Metabolic Panel:  Lab 05/05/11 1610 05/04/11 0355 05/03/11 0322  NA 136 138 135  K 3.9 3.7 4.4  CL 97 99 93*  CO2 27 30 26   GLUCOSE 96 63* 191*  BUN 33* 20 28*  CREATININE 6.64* 5.16* 6.95*  ALB -- -- --  CALCIUM 8.8 8.5 9.1  PHOS -- 2.4 --   Liver Function Tests:  Lab 05/04/11 0355 05/03/11 0322  AST 39* 28  ALT 19 18  ALKPHOS 67 83  BILITOT 0.8 0.9  PROT 6.3 7.4  ALBUMIN 2.8* 3.7   No results found for this basename: LIPASE:3,AMYLASE:3 in the last 168 hours No results found for this basename: AMMONIA:3 in the last 168 hours CBC:  Lab 05/05/11 0828 05/04/11 0355 05/03/11 0322  WBC 10.1 12.2* 5.2  NEUTROABS -- -- 5.0  HGB 12.7* 12.6* 13.9  HCT 38.6* 38.0* 41.6  MCV 92.3 91.6 92.2  PLT 122* 123* 142*   Cardiac Enzymes:  Lab 05/03/11 0325  CKTOTAL 104  CKMB 1.2  CKMBINDEX --  TROPONINI <0.30   CBG:  Lab 05/05/11 0853 05/05/11 0823 05/05/11 0759 05/05/11 0728 05/04/11 1217  GLUCAP 100* 57* 51* 44* 74    Iron Studies: No results found for this basename: IRON:30,TIBC:30,SATURATION RATIOS:30,TRANSFERRIN:30,FERRITIN:30 in the last 168 hours Studies/Results: Mr Lumbar Spine Wo Contrast  05/04/2011  *RADIOLOGY REPORT*  Clinical Data: Renal failure patient.  Back pain and bacteremia. Assess for diskitis.  MRI LUMBAR SPINE WITHOUT  CONTRAST  Technique:  Multiplanar and multiecho pulse sequences of the lumbar spine were obtained without intravenous contrast.  Comparison: None.  Findings: There is abnormal edema within the intervertebral discs and the adjacent endplates at T10-11, T11-12, L1-2 and L4-5. Changes are most pronounced at the L1-2 level.  There are endplate disc herniations at this level.  The findings could represent early diskitis and osteomyelitis given this clinical history.  Without this clinical history, these findings could be degenerative.  There is moderate stenosis of the L1-2 level because of bulging of the disc in combination with facet and ligamentous hypertrophy. There is moderate stenosis at the L3-4 level because of disc bulging and facet and ligamentous hypertrophy.  There is severe stenosis at the L4-5 level because of end plate osteophytes, protrusion of disc material and facet and ligamentous hypertrophy. There is moderate stenosis of the lateral recesses and foramina at L5-S1 because of end plate osteophytes, bulging of the disc and facet hypertrophy.  Facet degeneration throughout the lumbar region is present.  There is a small amount of fluid associated with the facet on the right at L1-2.  This is indeterminate for degenerative versus possible early joint infection.  IMPRESSION: Abnormal edema affecting the intervertebral discs and endplates at T10-11, T11-12, L1-2 and L4-5.  Changes are most pronounced at the L1-2 level.  In this setting of bacteremia, these findings could be due to early diskitis/osteomyelitis.  There is no evidence of frank fluid collection within the disc.  There is no evidence of epidural abscess. There is some edematous change of the facets, most notable on the right at L1-2.  This is probably degenerative, but one could not rule out early infection of this joint.  However, it is possible these findings could be degenerative.  This scan does not definitively diagnosis infection.   Multifactorial spinal stenosis at L4-5 that could be symptomatic.  Original Report Authenticated By: Thomasenia Sales, M.D.   Dg Chest Port 1 View  05/04/2011  *RADIOLOGY REPORT*  Clinical Data: Evaluate for infiltrate  PORTABLE CHEST - 1 VIEW  Comparison: 05/03/2011  Findings: Mild left basilar opacity, possibly atelectasis. No pleural effusion or pneumothorax.  Stable cardiomegaly. Postsurgical changes related to prior CABG.  IMPRESSION: Mild left basilar opacity, possibly atelectasis.  Stable cardiomegaly.  Original Report Authenticated By: Charline Bills, M.D.   Medications:    . sodium chloride        . allopurinol  150 mg Oral Daily  . aspirin EC  81 mg Oral Daily  . calcium carbonate  200 mg of elemental calcium Oral TID WC  . ezetimibe-simvastatin  1 tablet Oral QHS  . ferrous fumarate  1 tablet Oral BID  . LORazepam      . LORazepam  0.5 mg Intravenous Once  . metoprolol tartrate  37.5 mg Oral BID  . multivitamin  1 tablet Oral Daily  . sodium chloride  3 mL Intravenous Q12H  . temazepam  15 mg Oral QHS  . vancomycin  1,000 mg Intravenous To Major  . vancomycin  1,000 mg Intravenous Q T,Th,Sa-HD  . warfarin  2.5 mg Oral Custom  . warfarin  5 mg Oral Custom  . Warfarin - Pharmacist Dosing Inpatient   Does not apply q1800    I  have reviewed scheduled and prn medications.  Physical Exam:  Blood pressure 135/60, pulse 82, temperature 98 F (36.7 C), temperature source Oral, resp. rate 20, height 6\' 1"  (1.854 m), weight 115 kg (253 lb 8.5 oz), SpO2 97.00%.  General: looks a lot better c/t admission Neck: Supple. JVD not elevated; no LAD  Lungs: Clear bilat Heart: irreg II/VI murmur  Abdomen: soft, nontender Lower extremities: no edema, old R 5th toe amp Neuro: alert, oriented x 3 Dialysis Access: right upper AVF + bruit, no erythema or drainage  Dialysis Orders: Center: [facility] On TTS EDW 111 (left at 110.7 4/23) HD Bath 2K 2.2 5 Ca Time 4.25Heparin 3400 Access  right upper AVF BFR 500 DFR A 1.5 Zemplar IV/HD Epogen none Venofer 100 q week  Assessment/Plan  1. Fever / gram + bacteremia - on IV levaquin , zosyn and Vanc. + mid lower back pain for months >  MRI of LS spine showed mulit-level disc and adjacent endplate edema, could be degenerative vs infectious. +blood cx's, ID following.   2. ESRD - TTS - HD today, 4kg up by wts. Had PTA of AVF on 4/8 3. Hypertension/volume - home meds are metoprolol 37.5 bid- hold for sbp less than 120 for now.  4. Anemia - Hgb 13.9 - not on ESA; hold IV Fe for now  5. Metabolic bone disease - Continue binders (Tums 500 one ac tid) and zemplar 2ug/HD 6. Nutrition - high protein renal diet 7. Atrial fibrillation - Rate controlled on BB-INR therapeutic; pharmacy to dose 8. Diabetes - per primary 9. Hx gout - continue allopurinol 150/d   Alan Moselle  MD [facility] [phone] pgr    [phone] cell 05/05/2011, 12:29 PM

## 2011-05-05 NOTE — Progress Notes (Signed)
CBG: 57 Treatment:D50;5ml  Symptoms:pt alert and oriented;pt stated he is fine; Follow-up CBG: [id number] CBG Result:100  Possible Reasons for Event: pt did not eat  Comments/MD notified: MD was paged   Carma Leaven

## 2011-05-05 NOTE — Progress Notes (Signed)
ANTICOAGULATION and ANTIBIOTIC CONSULT NOTE - Initial Consult  Pharmacy Consult for Coumadin, vancomycin Indication: chronic Afib; GPC bacteremia  No Known Allergies  Patient Measurements: weight 245 lbs  Vital Signs: Temp: 98 F (36.7 C) (04/27 0730) Temp src: Oral (04/27 0730) BP: 131/62 mmHg (04/27 1000) Pulse Rate: 81  (04/27 1000)  Labs:  Alan [name] 05/05/11 0828 05/05/11 0430 05/04/11 0355 05/03/11 0325 05/03/11 0322  HGB 12.7* -- 12.6* -- --  HCT 38.6* -- 38.0* -- 41.6  PLT 122* -- 123* -- 142*  APTT -- -- -- -- 33  LABPROT -- 26.0* 27.7* -- 27.7*  INR -- 2.34* 2.53* -- 2.53*  HEPARINUNFRC -- -- -- -- --  CREATININE 6.64* -- 5.16* -- 6.95*  CKTOTAL -- -- -- 104 --  CKMB -- -- -- 1.2 --  TROPONINI -- -- -- <0.30 --   Estimated Creatinine Clearance: 12.4 ml/min (by C-G formula based on Cr of 6.64).  Medical History: Past Medical History  Diagnosis Date  . Diabetes mellitus   . Gout   . Hypertension   . Hyperlipidemia   . Chronic kidney disease     Started dialysis October 2012  . Arthritis   . Prostate cancer     s/p seed implant  . Cataract     BILATERAL-BEEN REMOVED  . Neuromuscular disorder     DABETIC NEUROPATHY-LOWER EXTREMITY  . Atrial fibrillation     on coumadin  . Aortic stenosis     moderate to severe, not felt to be a surgical candidate  . Anemia     secondary to end stage renal disease  . Coronary artery disease     remote CABG in 1996; last stress test in 2005; last cath 2005  . Diabetic neuropathy   . Edema   . PAD (peripheral artery disease)   . Diabetic retinopathy   . Chronic anticoagulation     on coumadin  . Nephrolithiasis     prior history of percutaneous nephrostomy  . Shortness of breath     Medications:  See med rec  Assessment: Patient is a 78 y.o M with ESRD (regular HD schedule TTS) presented to the ED with c/o AMS.   Wife stated that he didn't go to his scheduled dialysis session because he was admited to the ED.  Both blood cultures are showing GPC so likely cath related bacteremia.  Has had mid/lower back pain, plan now is MRI to r/o discitis.  He was on coumadin PTA for hx Afib.  Home coumadin dose 2.5mg  daily except 5mg  on Tue, Thurs, and Sundays (verified with Coumadin clinic).  INR remains therapeutic and no noted bleeding complications with his anticoagulation.  Goal of Therapy:  INR 2-3; pre-HD vancomycin level= 15-25   Plan:  1) Vanc 1g IV qHD.  2) Coumadin 2.5mg  daily except 5mg  on Tues, Thurs, Sun  3)  Monitor labs and culture data for evaluation of Vancomycin response. 4)  Monitor INR and s/s ob bleeding for his anticoagulation therapy.  Alan [name], PharmD., MS Clinical Pharmacist Pager:  [phone] Thank you for allowing pharmacy to be part of this patients care team.

## 2011-05-05 NOTE — Progress Notes (Signed)
Dr. Burnadette Peter made aware of pt being in  A Flutter 2:1- 3:1 VSS pt asleep EKG obtained. No new orders.

## 2011-05-06 LAB — CULTURE, BLOOD (ROUTINE X 2): Culture  Setup Time: 201304250819

## 2011-05-06 LAB — GLUCOSE, CAPILLARY: Glucose-Capillary: 168 mg/dL — ABNORMAL HIGH (ref 70–99)

## 2011-05-06 MED ORDER — IBUPROFEN 400 MG PO TABS
400.0000 mg | ORAL_TABLET | Freq: Four times a day (QID) | ORAL | Status: DC | PRN
  Administered 2011-05-06: 400 mg via ORAL
  Filled 2011-05-06 (×2): qty 1

## 2011-05-06 MED ORDER — BISMUTH SUBSALICYLATE 262 MG/15ML PO SUSP
30.0000 mL | ORAL | Status: DC | PRN
  Administered 2011-05-06 – 2011-05-07 (×4): 30 mL via ORAL
  Filled 2011-05-06: qty 236

## 2011-05-06 MED ORDER — WARFARIN SODIUM 2.5 MG PO TABS
2.5000 mg | ORAL_TABLET | Freq: Once | ORAL | Status: AC
  Administered 2011-05-06: 2.5 mg via ORAL
  Filled 2011-05-06: qty 1

## 2011-05-06 MED ORDER — WARFARIN SODIUM 2.5 MG PO TABS
2.5000 mg | ORAL_TABLET | ORAL | Status: DC
  Administered 2011-05-07 – 2011-05-11 (×3): 2.5 mg via ORAL
  Filled 2011-05-06 (×5): qty 1

## 2011-05-06 MED ORDER — SACCHAROMYCES BOULARDII 250 MG PO CAPS
250.0000 mg | ORAL_CAPSULE | Freq: Two times a day (BID) | ORAL | Status: DC
  Administered 2011-05-06 – 2011-05-12 (×13): 250 mg via ORAL
  Filled 2011-05-06 (×15): qty 1

## 2011-05-06 MED ORDER — WARFARIN SODIUM 5 MG PO TABS
5.0000 mg | ORAL_TABLET | ORAL | Status: DC
  Administered 2011-05-08 – 2011-05-10 (×2): 5 mg via ORAL
  Filled 2011-05-06 (×3): qty 1

## 2011-05-06 NOTE — Progress Notes (Addendum)
ANTICOAGULATION and ANTIBIOTIC CONSULT NOTE - Initial Consult  Pharmacy Consult for Coumadin, Vancomycin Indication: chronic Afib; GPC bacteremia  No Known Allergies  Patient Measurements: weight 245 lbs  Vital Signs: Temp: 98.4 F (36.9 C) (04/28 0400) Temp src: Oral (04/28 0400) BP: 124/55 mmHg (04/28 0400) Pulse Rate: 81  (04/28 0400)  Labs:  Basename 05/06/11 0410 05/05/11 0828 05/05/11 0430 05/04/11 0355  HGB -- 12.7* -- 12.6*  HCT -- 38.6* -- 38.0*  PLT -- 122* -- 123*  APTT -- -- -- --  LABPROT 27.8* -- 26.0* 27.7*  INR 2.54* -- 2.34* 2.53*  HEPARINUNFRC -- -- -- --  CREATININE -- 6.64* -- 5.16*  CKTOTAL -- -- -- --  CKMB -- -- -- --  TROPONINI -- -- -- --   Estimated Creatinine Clearance: 12.5 ml/min (by C-G formula based on Cr of 6.64).  Medical History: Past Medical History  Diagnosis Date  . Diabetes mellitus   . Gout   . Hypertension   . Hyperlipidemia   . Chronic kidney disease     Started dialysis October 2012  . Arthritis   . Prostate cancer     s/p seed implant  . Cataract     BILATERAL-BEEN REMOVED  . Neuromuscular disorder     DABETIC NEUROPATHY-LOWER EXTREMITY  . Atrial fibrillation     on coumadin  . Aortic stenosis     moderate to severe, not felt to be a surgical candidate  . Anemia     secondary to end stage renal disease  . Coronary artery disease     remote CABG in 1996; last stress test in 2005; last cath 2005  . Diabetic neuropathy   . Edema   . PAD (peripheral artery disease)   . Diabetic retinopathy   . Chronic anticoagulation     on coumadin  . Nephrolithiasis     prior history of percutaneous nephrostomy  . Shortness of breath     Assessment: Patient is a 78 y.o M with ESRD (regular HD schedule TTS) presented to the ED with c/o AMS.   Wife stated that he didn't go to his scheduled dialysis session because he was admited to the ED. Both blood cultures are showing GPC so likely cath related bacteremia.  Has had  mid/lower back pain, plan now is MRI to r/o discitis.  Anticoagulation:  He was on coumadin PTA for hx Afib.  Home coumadin dose 2.5mg  daily except 5mg  on Tue, Thurs, and Sundays (verified with Coumadin clinic).  INR remains therapeutic and no noted bleeding complications with his anticoagulation.  It does not appear that he received a Warfarin dose yesterday, despite higher INR today.    Infectious Disease:  He had a HD session yesterday - received 1 gm IV Vancomycin without noted complications.  Today he is afebrile, no CBC but WBC wnl yesterday.  C.Diff was negative and remaining cultures are NGTD.  Goal of Therapy:  INR 2-3; pre-HD  Vancomycin level= 15-25 (pre-HD)   Plan:  1)  Continue his Vancomycin at 1g IV qHD.  2)   Monitor labs and culture data for evaluation of Vancomycin response. 3)   Coumadin 2.5mg  x1 today then daily except 5mg  on Tues, Thurs, Sun   4)  Monitor INR and s/s of bleeding for his anticoagulation therapy.  Nadara Mustard, PharmD., MS Clinical Pharmacist Pager:  [phone] Thank you for allowing pharmacy to be part of this patients care team.

## 2011-05-06 NOTE — Progress Notes (Signed)
  [facility]   Subjective: Complaining of diarrhea today. Per RN, he has a rectal pouch but a very small amount of liquid stool is leaking out around it. No stool in rectal pouch. No abd pain, nausea or vomiting.  Pt states to me that back pain has been ongoing for about 1 yr. I have explained that MRI reveals degenrative disease similar to arthritis. He and his wife state that is what they suspected. Advil used to help but he was asked to stop it due to his renal failure (before dialysis was started). I have explained that we can restart this now. He is pleased with this decision.   Objective: Vital signs in last 24 hours: Temp:  [98 F (36.7 C)-99.2 F (37.3 C)] 98.7 F (37.1 C) (04/28 1615) Pulse Rate:  [81-124] 82  (04/28 1800) Resp:  [16-22] 21  (04/28 1800) BP: (112-136)/(51-97) 112/58 mmHg (04/28 1800) SpO2:  [95 %-100 %] 95 % (04/28 1800) Weight:  [117 kg (257 lb 15 oz)] 117 kg (257 lb 15 oz) (04/28 0500) Weight change: 2.1 kg (4 lb 10.1 oz) Last BM Date: 05/05/11  Intake/Output from previous day: 04/27 0701 - 04/28 0700 In: 800 [P.O.:800] Out: 1907  Intake/Output this shift:    General appearance: alert, cooperative and calm Resp: clear to auscultation bilaterally Cardio: regular rate and rhythm GI: soft, non-tender; bowel sounds normal; no masses,  no organomegaly Extremities: extremities normal, atraumatic, no cyanosis or edema Neurologic: Grossly normal  Lab Results:  Basename 05/05/11 0828 05/04/11 0355  WBC 10.1 12.2*  HGB 12.7* 12.6*  HCT 38.6* 38.0*  PLT 122* 123*   BMET  Basename 05/05/11 0828 05/04/11 0355  NA 136 138  K 3.9 3.7  CL 97 99  CO2 27 30  GLUCOSE 96 63*  BUN 33* 20  CREATININE 6.64* 5.16*  CALCIUM 8.8 8.5    Studies/Results: No results found.  Medications: I have reviewed the patient's current medications.  Assessment/Plan:  Principal Problem:  *Sepsis Due to staph aureas bactermia *Likely source from dialysis  access *2-D echo neg for endocarditis; will need TEE * vancomycin.  Dr. Algis Liming with infectious disease states patient may likely need the addition of rifampin MIR ordered by Dr Arta Silence for back pain- he has discussed it with Dr Daiva Eves and we are awaiting his opinion on it.    Dehydration/Hypotension resolved   Atrial fibrillation *rate controlled *Continue metoprolol at home dose   Healthcare-associated pneumonia?? *Clinical exam not consistent with pneumonia or other pulmonary infection   Toxic metabolic encephalopathy *Resolved with treatment of acute infection and hypotension   Diabetes mellitus with hypoglycemia *improved after Amaryl d/c'd   Chronic anticoagulation *INR is therapeutic *Pharmacy dosing Coumadin   Aortic stenosis *Significant risk factor for endocarditis in setting of patient with gram-positive bacteremia   CAD (coronary artery disease) *Quiescent   End stage renal disease on dialysis *Nephrology following-dialyzes on Tuesdays Thursdays and Saturdays  Disposition *Remain in stepdown    LOS: 3 days   05/06/2011, 7:09 PM  Calvert Cantor, MD [phone]

## 2011-05-06 NOTE — Progress Notes (Addendum)
[facility]   Subjective: Evaluated on dialysis, having chills. No other new complaints. NO back pain currently.   Objective: Vital signs in last 24 hours: Temp:  [97.5 F (36.4 C)-99.2 F (37.3 C)] 98 F (36.7 C) (04/28 0805) Pulse Rate:  [79-124] 124  (04/28 0900) Resp:  [16-24] 20  (04/28 0900) BP: (101-137)/(52-77) 136/72 mmHg (04/28 0952) SpO2:  [95 %-99 %] 96 % (04/28 0900) Weight:  [117 kg (257 lb 15 oz)-117.1 kg (258 lb 2.5 oz)] 117 kg (257 lb 15 oz) (04/28 0500) Weight change: 2.1 kg (4 lb 10.1 oz) Last BM Date: 05/05/11  Intake/Output from previous day: 04/27 0701 - 04/28 0700 In: 800 [P.O.:800] Out: 1907  Intake/Output this shift: Total I/O In: 220 [P.O.:220] Out: -   General appearance: alert, cooperative, shaking from chills Resp: clear to auscultation bilaterally Cardio: regular rate and rhythm GI: soft, non-tender; bowel sounds normal; no masses,  no organomegaly Extremities: extremities normal, atraumatic, no cyanosis or edema Neurologic: Grossly normal  Lab Results:  [facility] 05/05/11 0828 05/04/11 0355  WBC 10.1 12.2*  HGB 12.7* 12.6*  HCT 38.6* 38.0*  PLT 122* 123*   BMET  Basename 05/05/11 0828 05/04/11 0355  NA 136 138  K 3.9 3.7  CL 97 99  CO2 27 30  GLUCOSE 96 63*  BUN 33* 20  CREATININE 6.64* 5.16*  CALCIUM 8.8 8.5    Studies/Results: Mr Lumbar Spine Wo Contrast  05/04/2011  *RADIOLOGY REPORT*  Clinical Data: Renal failure patient.  Back pain and bacteremia. Assess for diskitis.  MRI LUMBAR SPINE WITHOUT CONTRAST  Technique:  Multiplanar and multiecho pulse sequences of the lumbar spine were obtained without intravenous contrast.  Comparison: None.  Findings: There is abnormal edema within the intervertebral discs and the adjacent endplates at T10-11, T11-12, L1-2 and L4-5. Changes are most pronounced at the L1-2 level.  There are endplate disc herniations at this level.  The findings could represent early diskitis and  osteomyelitis given this clinical history.  Without this clinical history, these findings could be degenerative.  There is moderate stenosis of the L1-2 level because of bulging of the disc in combination with facet and ligamentous hypertrophy. There is moderate stenosis at the L3-4 level because of disc bulging and facet and ligamentous hypertrophy.  There is severe stenosis at the L4-5 level because of end plate osteophytes, protrusion of disc material and facet and ligamentous hypertrophy. There is moderate stenosis of the lateral recesses and foramina at L5-S1 because of end plate osteophytes, bulging of the disc and facet hypertrophy.  Facet degeneration throughout the lumbar region is present.  There is a small amount of fluid associated with the facet on the right at L1-2.  This is indeterminate for degenerative versus possible early joint infection.  IMPRESSION: Abnormal edema affecting the intervertebral discs and endplates at T10-11, T11-12, L1-2 and L4-5.  Changes are most pronounced at the L1-2 level.  In this setting of bacteremia, these findings could be due to early diskitis/osteomyelitis.  There is no evidence of frank fluid collection within the disc.  There is no evidence of epidural abscess. There is some edematous change of the facets, most notable on the right at L1-2.  This is probably degenerative, but one could not rule out early infection of this joint.  However, it is possible these findings could be degenerative.  This scan does not definitively diagnosis infection.  Multifactorial spinal stenosis at L4-5 that could be symptomatic.  Original Report Authenticated By: Loraine Leriche  E. Karin Golden, M.D.    Medications: I have reviewed the patient's current medications.  Assessment/Plan:  Principal Problem:  *Sepsis *Currently hemodynamically stable *Treat underlying causes  Active Problems:  Bacteremia due to Gram-positive bacteria *Likely source from dialysis access *2-D echo neg for  endocarditis; will need TEE * vancomycin. Dr. Algis Liming with infectious disease states patient may likely need the addition of rifampin   Dehydration/Hypotension *Blood pressure stable but we'll continue IV fluids for now   Atrial fibrillation *Rhythm has been variable between sinus rhythm and nitroglycerin fibrillation but rate is controlled *Continue low-dose metoprolol   Healthcare-associated pneumonia?? *Clinical exam not consistent with pneumonia or other pulmonary infection   Toxic metabolic encephalopathy *Resolved with treatment of acute infection and hypotension   Diabetes mellitus with hypoglycemia *improved after Amaryl d/c'd   Chronic anticoagulation *INR is therapeutic *Pharmacy dosing Coumadin   Aortic stenosis *Significant risk factor for endocarditis in setting of patient with gram-positive bacteremia   CAD (coronary artery disease) *Quiescent   End stage renal disease on dialysis *Nephrology following-dialyzes on Tuesdays Thursdays and Saturdays  Disposition *Remain in stepdown    LOS: 3 days   05/06/2011, 11:24 AM   Calvert Cantor, MD [phone]

## 2011-05-06 NOTE — Progress Notes (Addendum)
Subjective:  C/o diarrhea, otherwise stable  Objective:    Vital signs in last 24 hours: Filed Vitals:   05/06/11 0805 05/06/11 0900 05/06/11 0952 05/06/11 1213  BP: 136/72 136/72 136/72 112/75  Pulse: 84 124  82  Temp: 98 F (36.7 C)   98 F (36.7 C)  TempSrc: Oral   Oral  Resp: 21 20  20   Height:      Weight:      SpO2: 99% 96%  98%   Weight change: 2.1 kg (4 lb 10.1 oz)  Intake/Output Summary (Last 24 hours) at 05/06/11 1243 Last data filed at 05/06/11 0900  Gross per 24 hour  Intake    660 ml  Output   1907 ml  Net  -1247 ml   Labs: Basic Metabolic Panel:  Lab 05/05/11 4098 05/04/11 0355 05/03/11 0322  NA 136 138 135  K 3.9 3.7 4.4  CL 97 99 93*  CO2 27 30 26   GLUCOSE 96 63* 191*  BUN 33* 20 28*  CREATININE 6.64* 5.16* 6.95*  ALB -- -- --  CALCIUM 8.8 8.5 9.1  PHOS -- 2.4 --   Liver Function Tests:  Lab 05/04/11 0355 05/03/11 0322  AST 39* 28  ALT 19 18  ALKPHOS 67 83  BILITOT 0.8 0.9  PROT 6.3 7.4  ALBUMIN 2.8* 3.7   No results found for this basename: LIPASE:3,AMYLASE:3 in the last 168 hours No results found for this basename: AMMONIA:3 in the last 168 hours CBC:  Lab 05/05/11 0828 05/04/11 0355 05/03/11 0322  WBC 10.1 12.2* 5.2  NEUTROABS -- -- 5.0  HGB 12.7* 12.6* 13.9  HCT 38.6* 38.0* 41.6  MCV 92.3 91.6 92.2  PLT 122* 123* 142*   Cardiac Enzymes:  Lab 05/03/11 0325  CKTOTAL 104  CKMB 1.2  CKMBINDEX --  TROPONINI <0.30   CBG:  Lab 05/06/11 0703 05/05/11 1806 05/05/11 1232 05/05/11 0853 05/05/11 0823  GLUCAP 168* 129* 162* 100* 57*    Iron Studies: No results found for this basename: IRON:30,TIBC:30,SATURATION RATIOS:30,TRANSFERRIN:30,FERRITIN:30 in the last 168 hours Studies/Results: Mr Lumbar Spine Wo Contrast  05/04/2011  *RADIOLOGY REPORT*  Clinical Data: Renal failure patient.  Back pain and bacteremia. Assess for diskitis.  MRI LUMBAR SPINE WITHOUT CONTRAST  Technique:  Multiplanar and multiecho pulse sequences of the  lumbar spine were obtained without intravenous contrast.  Comparison: None.  Findings: There is abnormal edema within the intervertebral discs and the adjacent endplates at T10-11, T11-12, L1-2 and L4-5. Changes are most pronounced at the L1-2 level.  There are endplate disc herniations at this level.  The findings could represent early diskitis and osteomyelitis given this clinical history.  Without this clinical history, these findings could be degenerative.  There is moderate stenosis of the L1-2 level because of bulging of the disc in combination with facet and ligamentous hypertrophy. There is moderate stenosis at the L3-4 level because of disc bulging and facet and ligamentous hypertrophy.  There is severe stenosis at the L4-5 level because of end plate osteophytes, protrusion of disc material and facet and ligamentous hypertrophy. There is moderate stenosis of the lateral recesses and foramina at L5-S1 because of end plate osteophytes, bulging of the disc and facet hypertrophy.  Facet degeneration throughout the lumbar region is present.  There is a small amount of fluid associated with the facet on the right at L1-2.  This is indeterminate for degenerative versus possible early joint infection.  IMPRESSION: Abnormal edema affecting the intervertebral discs and endplates  at T10-11, T11-12, L1-2 and L4-5.  Changes are most pronounced at the L1-2 level.  In this setting of bacteremia, these findings could be due to early diskitis/osteomyelitis.  There is no evidence of frank fluid collection within the disc.  There is no evidence of epidural abscess. There is some edematous change of the facets, most notable on the right at L1-2.  This is probably degenerative, but one could not rule out early infection of this joint.  However, it is possible these findings could be degenerative.  This scan does not definitively diagnosis infection.  Multifactorial spinal stenosis at L4-5 that could be symptomatic.  Original  Report Authenticated By: Thomasenia Sales, M.D.   Medications:      . allopurinol  150 mg Oral Daily  . aspirin EC  81 mg Oral Daily  . calcium carbonate  200 mg of elemental calcium Oral TID WC  . ezetimibe-simvastatin  1 tablet Oral QHS  . ferrous fumarate  1 tablet Oral BID  . metoprolol tartrate  37.5 mg Oral BID  . multivitamin  1 tablet Oral Daily  . sodium chloride  3 mL Intravenous Q12H  . temazepam  15 mg Oral QHS  . vancomycin  1,000 mg Intravenous Q T,Th,Sa-HD  . warfarin  2.5 mg Oral ONCE-1800  . warfarin  2.5 mg Oral Custom  . warfarin  5 mg Oral Custom  . Warfarin - Pharmacist Dosing Inpatient   Does not apply q1800  . DISCONTD: warfarin  5 mg Oral Custom    I  have reviewed scheduled and prn medications.  Physical Exam:  Blood pressure 112/75, pulse 82, temperature 98 F (36.7 C), temperature source Oral, resp. rate 20, height 6\' 1"  (1.854 m), weight 117 kg (257 lb 15 oz), SpO2 98.00%.  General: looks a lot better c/t admission Neck: Supple. JVD not elevated; no LAD  Lungs: Clear bilat Heart: irreg II/VI murmur  Abdomen: soft, nontender Lower extremities: no edema, old R 5th toe amp Neuro: alert, oriented x 3 Dialysis Access: right upper AVF + bruit, no erythema or drainage  Dialysis Orders: Center: [facility] On TTS EDW 111 (left at 110.7 4/23) HD Bath 2K 2.2 5 Ca Time 4.25Heparin 3400 Access right upper AVF BFR 500 DFR A 1.5 Zemplar IV/HD Epogen none Venofer 100 q week  Assessment/Plan  1. Fever / staph bacteremia / probable discitis multi-level- per primary and ID. Planning TEE, IV abx for now   2. Diarrhea- started probiotics 3. ESRD - TTS. Had PTA of AVF on 4/8 4. Hypertension/volume -  metoprolol 37.5 bid 5. Anemia - Hgb 13.9 - not on ESA; hold IV Fe for now 6. Metabolic bone disease - Continue binders (Tums 500 one ac tid) and zemplar 2ug/HD 7. Nutrition - high protein renal diet 8. Atrial fibrillation - Rate controlled on BB-INR therapeutic;  pharmacy to dose 9. Diabetes - per primary 10. Hx gout - continue allopurinol 150/d   Vinson Moselle  MD [facility] [phone] pgr    [phone] cell 05/06/2011, 12:43 PM

## 2011-05-06 NOTE — Progress Notes (Signed)
Notified M. Lynch NP of 12 beat run of V-tach. Pt is asymptomatic bp 115/55, hr 78 a flutter, 16 resp/min.  No new orders initiated will continue to monitor pt. status

## 2011-05-07 LAB — STOOL CULTURE

## 2011-05-07 LAB — PROTIME-INR
INR: 2.39 — ABNORMAL HIGH (ref 0.00–1.49)
Prothrombin Time: 26.5 seconds — ABNORMAL HIGH (ref 11.6–15.2)

## 2011-05-07 LAB — GLUCOSE, CAPILLARY
Glucose-Capillary: 153 mg/dL — ABNORMAL HIGH (ref 70–99)
Glucose-Capillary: 159 mg/dL — ABNORMAL HIGH (ref 70–99)
Glucose-Capillary: 129 mg/dL — ABNORMAL HIGH (ref 70–99)

## 2011-05-07 MED ORDER — CEFAZOLIN SODIUM-DEXTROSE 2-3 GM-% IV SOLR
2.0000 g | INTRAVENOUS | Status: DC
  Administered 2011-05-08 – 2011-05-12 (×3): 2 g via INTRAVENOUS
  Filled 2011-05-07 (×5): qty 50

## 2011-05-07 MED ORDER — HEPARIN SODIUM (PORCINE) 1000 UNIT/ML DIALYSIS
3400.0000 [IU] | INTRAMUSCULAR | Status: DC | PRN
  Administered 2011-05-08: 3400 [IU] via INTRAVENOUS_CENTRAL
  Filled 2011-05-07: qty 4

## 2011-05-07 MED ORDER — WARFARIN SODIUM 2.5 MG PO TABS
2.5000 mg | ORAL_TABLET | Freq: Once | ORAL | Status: DC
  Filled 2011-05-07: qty 1

## 2011-05-07 NOTE — Progress Notes (Signed)
Subjective: No new complaints   Antibiotics:  Anti-infectives     Start     Dose/Rate Route Frequency Ordered Stop   05/05/11 1500   levofloxacin (LEVAQUIN) IVPB 500 mg  Status:  Discontinued        500 mg 100 mL/hr over 60 Minutes Intravenous Every 48 hours 05/03/11 1424 05/04/11 0843   05/05/11 1200   vancomycin (VANCOCIN) IVPB 1000 mg/200 mL premix        1,000 mg 200 mL/hr over 60 Minutes Intravenous Every T-Th-Sa (Hemodialysis) 05/03/11 1645     05/03/11 1430   levofloxacin (LEVAQUIN) IVPB 750 mg        750 mg 100 mL/hr over 90 Minutes Intravenous To [facility] 05/03/11 1424 05/03/11 2330   05/03/11 1400   piperacillin-tazobactam (ZOSYN) IVPB 2.25 g  Status:  Discontinued        2.25 g 100 mL/hr over 30 Minutes Intravenous Every 8 hours 05/03/11 1325 05/04/11 0843   05/03/11 1330   vancomycin (VANCOCIN) IVPB 1000 mg/200 mL premix        1,000 mg 200 mL/hr over 60 Minutes Intravenous To [facility] 05/03/11 1325 05/04/11 1330   05/03/11 0330  piperacillin-tazobactam (ZOSYN) IVPB 3.375 g       3.375 g 12.5 mL/hr over 240 Minutes Intravenous  Once 05/03/11 0323 05/03/11 0802   05/03/11 0330   vancomycin (VANCOCIN) IVPB 1000 mg/200 mL premix        1,000 mg 200 mL/hr over 60 Minutes Intravenous  Once 05/03/11 0323 05/03/11 0609          Medications: Scheduled Meds:   . allopurinol  150 mg Oral Daily  . aspirin EC  81 mg Oral Daily  . calcium carbonate  200 mg of elemental calcium Oral TID WC  . ezetimibe-simvastatin  1 tablet Oral QHS  . ferrous fumarate  1 tablet Oral BID  . metoprolol tartrate  37.5 mg Oral BID  . multivitamin  1 tablet Oral Daily  . saccharomyces boulardii  250 mg Oral BID  . sodium chloride  3 mL Intravenous Q12H  . temazepam  15 mg Oral QHS  . vancomycin  1,000 mg Intravenous Q T,Th,Sa-HD  . warfarin  2.5 mg Oral ONCE-1800  . warfarin  2.5 mg Oral Custom  . warfarin  5 mg Oral Custom  . Warfarin - Pharmacist Dosing  Inpatient   Does not apply q1800  . DISCONTD: warfarin  2.5 mg Oral ONCE-1800   Continuous Infusions:  PRN Meds:.sodium chloride, acetaminophen, acetaminophen, bismuth subsalicylate, camphor-menthol, dextrose, docusate sodium, feeding supplement (NEPRO CARB STEADY), heparin, heparin, hydrOXYzine, ibuprofen, ondansetron (ZOFRAN) IV, ondansetron, oxyCODONE, sodium chloride, sorbitol, traMADol   Objective: Weight change: -4 lb 10.1 oz (-2.1 kg)  Intake/Output Summary (Last 24 hours) at 05/07/11 1201 Last data filed at 05/07/11 0939  Gross per 24 hour  Intake    823 ml  Output      1 ml  Net    822 ml   Blood pressure 129/61, pulse 80, temperature 98.4 F (36.9 C), temperature source Oral, resp. rate 19, height 6\' 1"  (1.854 m), weight 253 lb 8.5 oz (115 kg), SpO2 98.00%. Temp:  [97.8 F (36.6 C)-98.7 F (37.1 C)] 98.4 F (36.9 C) (04/29 0736) Pulse Rate:  [79-87] 80  (04/29 1100) Resp:  [16-24] 19  (04/29 1100) BP: (104-131)/(43-97) 129/61 mmHg (04/29 1100) SpO2:  [95 %-100 %] 98 % (04/29 1100) Weight:  [253 lb 8.5 oz (115 kg)] 253 lb  8.5 oz (115 kg) (04/29 0500)  Physical Exam: General: Alert and awake, oriented x3, not in any acute distress. HEENT: anicteric sclera, pupils reactive to light and accommodation, EOMI, oropharynx clear and without exudate  CVS regular rate, normal r, Iii/vi high pitched murmru radiating to the carotids,  Chest: fairly clear to auscultation bilaterally, no wheezing, rales or rhonchi  Abdomen: soft nontender, nondistended, normal bowel sounds,  Extremities: no clubbing or edema noted bilaterally  Skin: fistula site is without overt purulence or fluctuance  Neuro: nonfocal, strength and sensation intact   Lab Results:  [facility] 05/05/11 0828  WBC 10.1  HGB 12.7*  HCT 38.6*  PLT 122*    BMET  Basename 05/05/11 0828  NA 136  K 3.9  CL 97  CO2 27  GLUCOSE 96  BUN 33*  CREATININE 6.64*  CALCIUM 8.8    Micro Results: Recent Results  (from the past 240 hour(s))  CULTURE, BLOOD (ROUTINE X 2)     Status: Normal   Collection Time   05/03/11  3:30 AM      Component Value Range Status Comment   Specimen Description BLOOD LEFT ARM   Final    Special Requests BOTTLES DRAWN AEROBIC AND ANAEROBIC [facility] EACH   Final    Culture  Setup Time [id number]   Final    Culture     Final    Value: STAPHYLOCOCCUS AUREUS     Note: SUSCEPTIBILITIES PERFORMED ON PREVIOUS CULTURE WITHIN THE LAST 5 DAYS.     Note: Gram Stain Report Called to,Read Back By and Verified With: HILDA MOOSE @ 1957 ON 05/03/2011 HAJAM   Report Status 05/06/2011 FINAL   Final   CULTURE, BLOOD (ROUTINE X 2)     Status: Normal   Collection Time   05/03/11  3:45 AM      Component Value Range Status Comment   Specimen Description BLOOD LEFT HAND   Final    Special Requests BOTTLES DRAWN AEROBIC AND ANAEROBIC [facility]   Final    Culture  Setup Time [id number]   Final    Culture     Final    Value: STAPHYLOCOCCUS AUREUS     Note: RIFAMPIN AND GENTAMICIN SHOULD NOT BE USED AS SINGLE DRUGS FOR TREATMENT OF STAPH INFECTIONS. This organism DOES NOT demonstrate inducible Clindamycin resistance in vitro.     Note: Gram Stain Report Called to,Read Back By and Verified With: HILDA MOOSE @ 1957 ON 05/03/2011 HAJAM   Report Status 05/06/2011 FINAL   Final    Organism ID, Bacteria STAPHYLOCOCCUS AUREUS   Final   MRSA PCR SCREENING     Status: Normal   Collection Time   05/03/11  5:24 PM      Component Value Range Status Comment   MRSA by PCR NEGATIVE  NEGATIVE  Final   STOOL CULTURE     Status: Normal   Collection Time   05/04/11  4:19 AM      Component Value Range Status Comment   Specimen Description STOOL   Final    Special Requests NONE   Final    Culture     Final    Value: NO SALMONELLA, SHIGELLA, CAMPYLOBACTER, OR YERSINIA ISOLATED   Report Status 05/07/2011 FINAL   Final   CULTURE, BLOOD (ROUTINE X 2)     Status: Normal (Preliminary result)   Collection Time    05/04/11  9:36 AM      Component Value Range Status Comment   Specimen  Description BLOOD HAND RIGHT   Final    Special Requests BOTTLES DRAWN AEROBIC ONLY 8CC   Final    Culture  Setup Time [id number]   Final    Culture     Final    Value:        BLOOD CULTURE RECEIVED NO GROWTH TO DATE CULTURE WILL BE HELD FOR 5 DAYS BEFORE ISSUING A FINAL NEGATIVE REPORT   Report Status PENDING   Incomplete   CULTURE, BLOOD (ROUTINE X 2)     Status: Normal (Preliminary result)   Collection Time   05/04/11  9:47 AM      Component Value Range Status Comment   Specimen Description BLOOD FOOT LEFT   Final    Special Requests BOTTLES DRAWN AEROBIC ONLY 8CC   Final    Culture  Setup Time [id number]   Final    Culture     Final    Value:        BLOOD CULTURE RECEIVED NO GROWTH TO DATE CULTURE WILL BE HELD FOR 5 DAYS BEFORE ISSUING A FINAL NEGATIVE REPORT   Report Status PENDING   Incomplete   CLOSTRIDIUM DIFFICILE BY PCR     Status: Normal   Collection Time   05/04/11  5:00 PM      Component Value Range Status Comment   C difficile by pcr NEGATIVE  NEGATIVE  Final     Studies/Results: No results found.    Assessment/Plan: Alan [name] is a 78 y.o. male with 78 year old man with aortic stenosis, end-stage renal disease on hemodialysis via AV fistula and right upper extremity now with MS Staphylococcus aureus bacteremia  1) Staphylococcus aureus bacteremia:  --change to ancef --follow blood cultures to ensure clearance have been sent  --Patient needs a transesophageal echocardiogram done to assess his heart valves.  --I see no evidence that the AV fistula is overtly infected but we certainly worry about in this patient with Staphylococcus aureus bacteremia.   #2 ? Diskitis: based on MRI: would treat him for minimum of 6 weeks with IV antiotics with fu with [place] in [facility]  #3 diarrhea:  C diff pCR negative and improved  LOS: 4 days   Acey Lav 05/07/2011, 12:01 PM

## 2011-05-07 NOTE — Progress Notes (Signed)
ANTICOAGULATION and ANTIBIOTIC CONSULT NOTE   Pharmacy Consult for Coumadin, Vancomycin Indication: chronic Afib; MSSA bacteremia  No Known Allergies  Patient Measurements: weight 245 lbs  Vital Signs: Temp: 98.4 F (36.9 C) (04/29 0736) Temp src: Oral (04/29 0736) BP: 130/54 mmHg (04/29 0736) Pulse Rate: 81  (04/29 0736)  Labs:  Alan [name] 05/07/11 0357 05/06/11 0410 05/05/11 0828 05/05/11 0430  HGB -- -- 12.7* --  HCT -- -- 38.6* --  PLT -- -- 122* --  APTT -- -- -- --  LABPROT 26.5* 27.8* -- 26.0*  INR 2.39* 2.54* -- 2.34*  HEPARINUNFRC -- -- -- --  CREATININE -- -- 6.64* --  CKTOTAL -- -- -- --  CKMB -- -- -- --  TROPONINI -- -- -- --   Estimated Creatinine Clearance: 12.4 ml/min (by C-G formula based on Cr of 6.64).  Medical History: Past Medical History  Diagnosis Date  . Diabetes mellitus   . Gout   . Hypertension   . Hyperlipidemia   . Chronic kidney disease     Started dialysis October 2012  . Arthritis   . Prostate cancer     s/p seed implant  . Cataract     BILATERAL-BEEN REMOVED  . Neuromuscular disorder     DABETIC NEUROPATHY-LOWER EXTREMITY  . Atrial fibrillation     on coumadin  . Aortic stenosis     moderate to severe, not felt to be a surgical candidate  . Anemia     secondary to end stage renal disease  . Coronary artery disease     remote CABG in 1996; last stress test in 2005; last cath 2005  . Diabetic neuropathy   . Edema   . PAD (peripheral artery disease)   . Diabetic retinopathy   . Chronic anticoagulation     on coumadin  . Nephrolithiasis     prior history of percutaneous nephrostomy  . Shortness of breath     Assessment: Patient is a 78 y.o M with ESRD (regular HD schedule TTS) presented to the ED with c/o AMS.   Wife stated that he didn't go to his scheduled dialysis session because he was admited to the ED. Both blood cultures are showing MSSA so likely cath related bacteremia.  MRI shows possible  discitis.  Anticoagulation:  He was on coumadin PTA for hx Afib.  Home coumadin dose 2.5mg  daily except 5mg  on Tue, Thurs, and Sundays (verified with Coumadin clinic).  INR remains therapeutic and no noted bleeding complications with his anticoagulation. Will resume home dose - 2.5mg  today.  Infectious Disease:  He had a HD session 4/27- received 1 gm IV Vancomycin without noted complications.  Today he is afebrile.  Repeat blood cultures negative.  With MSSA found in blood cultures, expect Vancomycin to be changed to cefazolin today.  Goal of Therapy:  INR 2-3; pre-HD  Vancomycin level= 15-25 (pre-HD)   Plan:  1)  Continue his Vancomycin at 1g IV qHD - await orders to change to cefazolin.  2)   Coumadin 2.5mg  x1 today then daily except 5mg  on Tues, Thurs, Sun   3)  Monitor INR and s/s of bleeding for his anticoagulation therapy.   Celedonio Miyamoto, PharmD, BCPS Clinical Pharmacist Pager [phone]  Thank you for allowing pharmacy to be part of this patients care team.

## 2011-05-07 NOTE — Progress Notes (Signed)
[facility] [place] TEAM 1 - Stepdown/ICU TEAM  Subjective: No further diarrhea. Denies shortness of breath or chest pain. Endorses has had issues with chronic back pain for greater than one year and usually takes Advil or ibuprofen. Unable to clarify whether has had a change in back pain. Wife is at the bedside  Objective: Blood pressure 113/74, pulse 83, temperature 98.4 F (36.9 C), temperature source Oral, resp. rate 20, height 6\' 1"  (1.854 m), weight 115.1 kg (253 lb 12 oz), SpO2 98.00%.  Intake/Output from previous day: 04/28 0701 - 04/29 0700 In: 800 [P.O.:800] Out: 1 [Stool:1] Intake/Output this shift: Total I/O In: 243 [P.O.:240; I.V.:3] Out: -   General appearance: alert, cooperative and calm Resp: clear to auscultation bilaterally Cardio: regular rate and rhythm GI: soft, non-tender; bowel sounds normal; no masses,  no organomegaly Extremities: extremities normal, atraumatic, no cyanosis or edema Neurologic: Grossly normal  Lab Results:  Basename 05/05/11 0828  WBC 10.1  HGB 12.7*  HCT 38.6*  PLT 122*   BMET  Basename 05/05/11 0828  NA 136  K 3.9  CL 97  CO2 27  GLUCOSE 96  BUN 33*  CREATININE 6.64*  CALCIUM 8.8   Medications: I have reviewed the patient's current medications.  Assessment/Plan:  Sepsis - Due to staph aureas bactermia *Likely source from dialysis access *2-D echo neg for endocarditis - will need TEE therefore I have called [place] [facility] to see the patient today *Continue vancomycin.  *Dr. Algis Liming with infectious disease states patient may likely need the addition of rifampin *MRI ordered by Dr Arta Silence for back pain which demonstrating nonspecific edematous changes from T10-L5 with most pronounced changes at the L1-2 level- he has discussed it with Dr Daiva Eves and we are awaiting his opinion  Dehydration/Hypotension *resolved  Atrial fibrillation *rate controlled *Continue metoprolol at home  dose  Healthcare-associated pneumonia?? *Clinical exam not consistent with pneumonia or other pulmonary infection  Toxic metabolic encephalopathy *Resolved with treatment of acute infection and hypotension  Diabetes mellitus with hypoglycemia *improved after Amaryl d/c'd  Chronic anticoagulation *INR is therapeutic *Pharmacy dosing Coumadin  Aortic stenosis *Significant risk factor for endocarditis in setting of patient with gram-positive bacteremia  CAD (coronary artery disease) *Quiescent  End stage renal disease on dialysis *Nephrology following-dialyzes on Tuesdays Thursdays and Saturdays  Disposition *Transfer to non-telemetry 6700   LOS: 4 days  05/07/2011, 11:37 AM Junious Silk, [facility]  I have personally examined this patient and reviewed the entire database. I have reviewed the above note, made any necessary editorial changes, and agree with its content.  Lonia Blood, MD [facility]

## 2011-05-07 NOTE — Progress Notes (Signed)
Patient ID: Alan [name], male   DOB: 1931-12-16, 78 y.o.   MRN: [id number]   [facility] Progress Note    Subjective:   Diarrhea is improving and was afebrile overnight- denies any emerging complaints   Objective:   BP 130/54  Pulse 81  Temp(Src) 98.4 F (36.9 C) (Oral)  Resp 22  Ht 6\' 1"  (1.854 m)  Wt 115 kg (253 lb 8.5 oz)  BMI 33.45 kg/m2  SpO2 97%  Intake/Output Summary (Last 24 hours) at 05/07/11 0741 Last data filed at 05/06/11 2100  Gross per 24 hour  Intake    800 ml  Output      1 ml  Net    799 ml   Weight change: -2.1 kg (-4 lb 10.1 oz)  Physical Exam: QIO:[id number] resting in bed GMW:NUUVO RRR,normal S1 and S2  Resp:CTA bilaterally,  No rales/rhonchi ZDG:UYQI, obese, NT, BS normal Ext:No LE edema- healing scab over right foot, 4th toe dorsum  Imaging: No results found.  Labs: BMET  Lab 05/05/11 0828 05/04/11 0355 05/03/11 0322  NA 136 138 135  K 3.9 3.7 4.4  CL 97 99 93*  CO2 27 30 26   GLUCOSE 96 63* 191*  BUN 33* 20 28*  CREATININE 6.64* 5.16* 6.95*  ALB -- -- --  CALCIUM 8.8 8.5 9.1  PHOS -- 2.4 --   CBC  Lab 05/05/11 0828 05/04/11 0355 05/03/11 0322  WBC 10.1 12.2* 5.2  NEUTROABS -- -- 5.0  HGB 12.7* 12.6* 13.9  HCT 38.6* 38.0* 41.6  MCV 92.3 91.6 92.2  PLT 122* 123* 142*     Medications:      . allopurinol  150 mg Oral Daily  . aspirin EC  81 mg Oral Daily  . calcium carbonate  200 mg of elemental calcium Oral TID WC  . ezetimibe-simvastatin  1 tablet Oral QHS  . ferrous fumarate  1 tablet Oral BID  . metoprolol tartrate  37.5 mg Oral BID  . multivitamin  1 tablet Oral Daily  . saccharomyces boulardii  250 mg Oral BID  . sodium chloride  3 mL Intravenous Q12H  . temazepam  15 mg Oral QHS  . vancomycin  1,000 mg Intravenous Q T,Th,Sa-HD  . warfarin  2.5 mg Oral ONCE-1800  . warfarin  2.5 mg Oral Custom  . warfarin  5 mg Oral Custom  . Warfarin - Pharmacist Dosing Inpatient   Does not apply q1800    . DISCONTD: warfarin  5 mg Oral Custom     Assessment/ Plan:   1. Fever / MSSA bacteremia / probable discitis multi-level- Currently on vancomycin, TEE evaluation awaited to rule out SBE- appears will need RFM for augmentation of vancomycin per yesterday's note from [facility]. Clinically, seems to be doing better. 2. Diarrhea- started probiotics that seem to be helping, C.difficile negative by PCR. 3. ESRD - TTS- plan for dialysis tomorrow. Had PTA of AVF on 4/8 4. Hypertension/volume - Well controlled on metoprolol- monitor trends for need to titrate dosing. 5. Anemia - Hgb 13.9 - not on ESA; hold IV Fe for now 6. Metabolic bone disease - Continue binders (Tums 500 one ac tid) and zemplar 2ug/HD 7. Nutrition - high protein renal diet 8. Atrial fibrillation - Rate controlled on BB-INR therapeutic; pharmacy to dose  Zetta Bills, MD 05/07/2011, 7:41 AM

## 2011-05-07 NOTE — Progress Notes (Signed)
ANTIBIOTIC CONSULT NOTE - INITIAL  Pharmacy Consult for Cefazolin Indication: MSSA Bactermia  No Known Allergies  Patient Measurements: Height: 6\' 1"  (185.4 cm) Weight: 253 lb 8.5 oz (115 kg) IBW/kg (Calculated) : 79.9   Vital Signs: Temp: 98.4 F (36.9 C) (04/29 0736) Temp src: Oral (04/29 0736) BP: 129/61 mmHg (04/29 1100) Pulse Rate: 80  (04/29 1100)  Labs:  Alan [name] 05/05/11 0828  WBC 10.1  HGB 12.7*  PLT 122*  LABCREA --  CREATININE 6.64*   Microbiology: Recent Results (from the past 720 hour(s))  CULTURE, BLOOD (ROUTINE X 2)     Status: Normal   Collection Time   05/03/11  3:30 AM      Component Value Range Status Comment   Specimen Description BLOOD LEFT ARM   Final    Special Requests BOTTLES DRAWN AEROBIC AND ANAEROBIC [facility] EACH   Final    Culture  Setup Time [id number]   Final    Culture     Final    Value: STAPHYLOCOCCUS AUREUS     Note: SUSCEPTIBILITIES PERFORMED ON PREVIOUS CULTURE WITHIN THE LAST 5 DAYS.     Note: Gram Stain Report Called to,Read Back By and Verified With: HILDA MOOSE @ 1957 ON 05/03/2011 HAJAM   Report Status 05/06/2011 FINAL   Final   CULTURE, BLOOD (ROUTINE X 2)     Status: Normal   Collection Time   05/03/11  3:45 AM      Component Value Range Status Comment   Specimen Description BLOOD LEFT HAND   Final    Special Requests BOTTLES DRAWN AEROBIC AND ANAEROBIC [facility]   Final    Culture  Setup Time [id number]   Final    Culture     Final    Value: STAPHYLOCOCCUS AUREUS     Note: RIFAMPIN AND GENTAMICIN SHOULD NOT BE USED AS SINGLE DRUGS FOR TREATMENT OF STAPH INFECTIONS. This organism DOES NOT demonstrate inducible Clindamycin resistance in vitro.     Note: Gram Stain Report Called to,Read Back By and Verified With: HILDA MOOSE @ 1957 ON 05/03/2011 HAJAM   Report Status 05/06/2011 FINAL   Final    Organism ID, Bacteria STAPHYLOCOCCUS AUREUS   Final   MRSA PCR SCREENING     Status: Normal   Collection Time   05/03/11  5:24 PM        Component Value Range Status Comment   MRSA by PCR NEGATIVE  NEGATIVE  Final   STOOL CULTURE     Status: Normal   Collection Time   05/04/11  4:19 AM      Component Value Range Status Comment   Specimen Description STOOL   Final    Special Requests NONE   Final    Culture     Final    Value: NO SALMONELLA, SHIGELLA, CAMPYLOBACTER, OR YERSINIA ISOLATED   Report Status 05/07/2011 FINAL   Final   CULTURE, BLOOD (ROUTINE X 2)     Status: Normal (Preliminary result)   Collection Time   05/04/11  9:36 AM      Component Value Range Status Comment   Specimen Description BLOOD HAND RIGHT   Final    Special Requests BOTTLES DRAWN AEROBIC ONLY 8CC   Final    Culture  Setup Time [id number]   Final    Culture     Final    Value:        BLOOD CULTURE RECEIVED NO GROWTH TO DATE CULTURE WILL BE HELD FOR 5  DAYS BEFORE ISSUING A FINAL NEGATIVE REPORT   Report Status PENDING   Incomplete   CULTURE, BLOOD (ROUTINE X 2)     Status: Normal (Preliminary result)   Collection Time   05/04/11  9:47 AM      Component Value Range Status Comment   Specimen Description BLOOD FOOT LEFT   Final    Special Requests BOTTLES DRAWN AEROBIC ONLY 8CC   Final    Culture  Setup Time [id number]   Final    Culture     Final    Value:        BLOOD CULTURE RECEIVED NO GROWTH TO DATE CULTURE WILL BE HELD FOR 5 DAYS BEFORE ISSUING A FINAL NEGATIVE REPORT   Report Status PENDING   Incomplete   CLOSTRIDIUM DIFFICILE BY PCR     Status: Normal   Collection Time   05/04/11  5:00 PM      Component Value Range Status Comment   C difficile by pcr NEGATIVE  NEGATIVE  Final     Assessment: 78 y.o. M with blood cultures that resulted as MSSA and is now to narrow antibiotics to Cefazolin for MSSA bacteremia -- per ID to get ~6 weeks due to diskitis shown on MRI. The patient will remain therapeutic on Vancomycin until his next HD session -- so will continue with Ancef at that time.   Goal of Therapy:  Eradication of  Infection  Plan:  1. Ancef 2g IV post HD sessions on TThSat 2. Will continue to follow HD schedule/duration for any changes in Ancef dosing.   Georgina Pillion, PharmD, BCPS Clinical Pharmacist Pager: [phone] 05/07/2011 12:41 PM

## 2011-05-07 NOTE — Progress Notes (Signed)
Pt Transferred to unit per MD order. Received report from previous nurse TaTa. Pt belongings sent with pt. Pt is alert and oriented. Denies pain on assessment.Will continue to monitor. Burley Saver, RN.

## 2011-05-07 NOTE — Plan of Care (Signed)
Problem: Phase III Progression Outcomes Goal: Voiding independently Outcome: Not Applicable Date Met:  05/07/11 Pt is anuric

## 2011-05-08 ENCOUNTER — Inpatient Hospital Stay (HOSPITAL_COMMUNITY): Payer: MEDICARE

## 2011-05-08 LAB — RENAL FUNCTION PANEL
CO2: 25 mEq/L (ref 19–32)
Calcium: 8.8 mg/dL (ref 8.4–10.5)
Chloride: 96 mEq/L (ref 96–112)
GFR calc Af Amer: 7 mL/min — ABNORMAL LOW (ref >90)
GFR calc non Af Amer: 6 mL/min — ABNORMAL LOW (ref >90)
Glucose, Bld: 141 mg/dL — ABNORMAL HIGH (ref 70–99)
Sodium: 133 mEq/L — ABNORMAL LOW (ref 135–145)

## 2011-05-08 LAB — CBC
Hemoglobin: 11.4 g/dL — ABNORMAL LOW (ref 13.0–17.0)
MCH: 30.2 pg (ref 26.0–34.0)
MCV: 90.5 fL (ref 78.0–100.0)
Platelets: 147 10*3/uL — ABNORMAL LOW (ref 150–400)
RBC: 3.78 MIL/uL — ABNORMAL LOW (ref 4.22–5.81)
WBC: 8.2 10*3/uL (ref 4.0–10.5)

## 2011-05-08 LAB — PROTIME-INR: Prothrombin Time: 26 seconds — ABNORMAL HIGH (ref 11.6–15.2)

## 2011-05-08 LAB — GLUCOSE, CAPILLARY: Glucose-Capillary: 181 mg/dL — ABNORMAL HIGH (ref 70–99)

## 2011-05-08 MED ORDER — DARBEPOETIN ALFA-POLYSORBATE 25 MCG/0.42ML IJ SOLN
INTRAMUSCULAR | Status: AC
  Administered 2011-05-08: 6.5476 ug via INTRAVENOUS
  Filled 2011-05-08: qty 0.42

## 2011-05-08 MED ORDER — RENA-VITE PO TABS
1.0000 | ORAL_TABLET | Freq: Every day | ORAL | Status: DC
  Administered 2011-05-08 – 2011-05-11 (×4): 1 via ORAL
  Filled 2011-05-08 (×5): qty 1

## 2011-05-08 MED ORDER — HYDROMORPHONE HCL PF 1 MG/ML IJ SOLN
INTRAMUSCULAR | Status: AC
  Administered 2011-05-08: 1 mg via INTRAVENOUS
  Filled 2011-05-08: qty 1

## 2011-05-08 MED ORDER — HYDROMORPHONE HCL PF 1 MG/ML IJ SOLN
1.0000 mg | Freq: Once | INTRAMUSCULAR | Status: AC
  Administered 2011-05-08: 1 mg via INTRAVENOUS

## 2011-05-08 MED ORDER — DARBEPOETIN ALFA-POLYSORBATE 25 MCG/0.42ML IJ SOLN
6.2500 ug | Freq: Once | INTRAMUSCULAR | Status: AC
  Administered 2011-05-08: 6.5476 ug via INTRAVENOUS
  Filled 2011-05-08: qty 0.42

## 2011-05-08 NOTE — Progress Notes (Signed)
ANTICOAGULATION & ANTIBIOTIC CONSULT NOTE - Follow Up Consult  Pharmacy Consult for Coumadin, Ancef Indication: Chronic Afib; MSSA bacteremia  No Known Allergies  Patient Measurements: Height: 6\' 1"  (185.4 cm) Weight: 253 lb 8.5 oz (115 kg) IBW/kg (Calculated) : 79.9   Vital Signs: Temp: 97.8 F (36.6 C) (04/30 0630) Temp src: Oral (04/30 0630) BP: 117/62 mmHg (04/30 0830) Pulse Rate: 81  (04/30 0830)  Labs:  Basename 05/08/11 0724 05/08/11 0723 05/07/11 0357 05/06/11 0410  HGB -- 11.4* -- --  HCT -- 34.2* -- --  PLT -- 147* -- --  APTT -- -- -- --  LABPROT -- -- 26.5* 27.8*  INR -- -- 2.39* 2.54*  HEPARINUNFRC -- -- -- --  CREATININE 7.58* -- -- --  CKTOTAL -- -- -- --  CKMB -- -- -- --  TROPONINI -- -- -- --   Estimated Creatinine Clearance: 10.8 ml/min (by C-G formula based on Cr of 7.58).   Medications:  Anti-infectives     Start     Dose/Rate Route Frequency Ordered Stop   05/08/11 2200   ceFAZolin (ANCEF) IVPB 2 g/50 mL premix        2 g 100 mL/hr over 30 Minutes Intravenous Every T-Th-Sa (1800) 05/07/11 1249     05/05/11 1500   levofloxacin (LEVAQUIN) IVPB 500 mg  Status:  Discontinued        500 mg 100 mL/hr over 60 Minutes Intravenous Every 48 hours 05/03/11 1424 05/04/11 0843   05/05/11 1200   vancomycin (VANCOCIN) IVPB 1000 mg/200 mL premix  Status:  Discontinued        1,000 mg 200 mL/hr over 60 Minutes Intravenous Every T-Th-Sa (Hemodialysis) 05/03/11 1645 05/07/11 1203   05/03/11 1430   levofloxacin (LEVAQUIN) IVPB 750 mg        750 mg 100 mL/hr over 90 Minutes Intravenous To [facility] 05/03/11 1424 05/03/11 2330   05/03/11 1400   piperacillin-tazobactam (ZOSYN) IVPB 2.25 g  Status:  Discontinued        2.25 g 100 mL/hr over 30 Minutes Intravenous Every 8 hours 05/03/11 1325 05/04/11 0843   05/03/11 1330   vancomycin (VANCOCIN) IVPB 1000 mg/200 mL premix        1,000 mg 200 mL/hr over 60 Minutes Intravenous To [facility] 05/03/11 1325 05/04/11 1330   05/03/11 0330  piperacillin-tazobactam (ZOSYN) IVPB 3.375 g       3.375 g 12.5 mL/hr over 240 Minutes Intravenous  Once 05/03/11 0323 05/03/11 0802   05/03/11 0330   vancomycin (VANCOCIN) IVPB 1000 mg/200 mL premix        1,000 mg 200 mL/hr over 60 Minutes Intravenous  Once 05/03/11 0323 05/03/11 7846          Assessment: 78 y.o. M on warfarin for hx Afib with a therapeutic INR this a.m (INR 2.34, goal of 2-3). Hgb/Hct/Plt slight drop. No s/sx of bleeding noted.   This patient is also on Cefazolin for MSSA bacteremia -- per ID to get ~6 weeks due to diskitis shown on MRI. Dose remains appropriate at this time  Goal of Therapy:  INR 2-3   Plan:  1. Continue warfarin 2.5 mg daily EXCEPT for 5 mg on TThSu 2. Continue Ancef 2g IV post HD on HD days 3. Will continue to monitor for any signs/symptoms of bleeding and will follow up with PT/INR in the a.m.   Georgina Pillion, PharmD, BCPS Clinical Pharmacist Pager: [phone] 05/08/2011 9:04 AM

## 2011-05-08 NOTE — Progress Notes (Signed)
Subjective: No new complaints   Antibiotics:  Anti-infectives     Start     Dose/Rate Route Frequency Ordered Stop   05/08/11 2200   ceFAZolin (ANCEF) IVPB 2 g/50 mL premix        2 g 100 mL/hr over 30 Minutes Intravenous Every T-Th-Sa (1800) 05/07/11 1249     05/05/11 1500   levofloxacin (LEVAQUIN) IVPB 500 mg  Status:  Discontinued        500 mg 100 mL/hr over 60 Minutes Intravenous Every 48 hours 05/03/11 1424 05/04/11 0843   05/05/11 1200   vancomycin (VANCOCIN) IVPB 1000 mg/200 mL premix  Status:  Discontinued        1,000 mg 200 mL/hr over 60 Minutes Intravenous Every T-Th-Sa (Hemodialysis) 05/03/11 1645 05/07/11 1203   05/03/11 1430   levofloxacin (LEVAQUIN) IVPB 750 mg        750 mg 100 mL/hr over 90 Minutes Intravenous [facility] 05/03/11 1424 05/03/11 2330   05/03/11 1400   piperacillin-tazobactam (ZOSYN) IVPB 2.25 g  Status:  Discontinued        2.25 g 100 mL/hr over 30 Minutes Intravenous Every 8 hours 05/03/11 1325 05/04/11 0843   05/03/11 1330   vancomycin (VANCOCIN) IVPB 1000 mg/200 mL premix        1,000 mg 200 mL/hr over 60 Minutes Intravenous To [facility] 05/03/11 1325 05/04/11 1330   05/03/11 0330  piperacillin-tazobactam (ZOSYN) IVPB 3.375 g       3.375 g 12.5 mL/hr over 240 Minutes Intravenous  Once 05/03/11 0323 05/03/11 0802   05/03/11 0330   vancomycin (VANCOCIN) IVPB 1000 mg/200 mL premix        1,000 mg 200 mL/hr over 60 Minutes Intravenous  Once 05/03/11 0323 05/03/11 0609          Medications: Scheduled Meds:    . allopurinol  150 mg Oral Daily  . aspirin EC  81 mg Oral Daily  . calcium carbonate  200 mg of elemental calcium Oral TID WC  .  ceFAZolin (ANCEF) IV  2 g Intravenous Q T,Th,Sat-1800  . darbepoetin (ARANESP) injection - DIALYSIS  6.5476 mcg Intravenous Once  . ezetimibe-simvastatin  1 tablet Oral QHS  .  HYDROmorphone (DILAUDID) injection  1 mg Intravenous Once  . metoprolol tartrate  37.5 mg Oral BID   . multivitamin  1 tablet Oral QHS  . saccharomyces boulardii  250 mg Oral BID  . sodium chloride  3 mL Intravenous Q12H  . temazepam  15 mg Oral QHS  . warfarin  2.5 mg Oral Custom  . warfarin  5 mg Oral Custom  . Warfarin - Pharmacist Dosing Inpatient   Does not apply q1800  . DISCONTD: ferrous fumarate  1 tablet Oral BID  . DISCONTD: multivitamin  1 tablet Oral Daily   Continuous Infusions:  PRN Meds:.sodium chloride, acetaminophen, acetaminophen, bismuth subsalicylate, camphor-menthol, dextrose, docusate sodium, feeding supplement (NEPRO CARB STEADY), heparin, heparin, hydrOXYzine, ibuprofen, ondansetron (ZOFRAN) IV, ondansetron, oxyCODONE, sodium chloride, sorbitol, traMADol   Objective: Weight change: 3.5 oz (0.1 kg)  Intake/Output Summary (Last 24 hours) at 05/08/11 1415 Last data filed at 05/08/11 1123  Gross per 24 hour  Intake    360 ml  Output   2559 ml  Net  -2199 ml   Blood pressure 120/61, pulse 82, temperature 97.1 F (36.2 C), temperature source Oral, resp. rate 18, height 6\' 1"  (1.854 m), weight 246 lb 11.1 oz (111.9 kg), SpO2 95.00%. Temp:  [97.1  F (36.2 C)-99.6 F (37.6 C)] 97.1 F (36.2 C) (04/30 1123) Pulse Rate:  [70-86] 82  (04/30 1123) Resp:  [17-20] 18  (04/30 1123) BP: (82-120)/(41-74) 120/61 mmHg (04/30 1123) SpO2:  [94 %-100 %] 95 % (04/30 1123) Weight:  [246 lb 11.1 oz (111.9 kg)-253 lb 8.5 oz (115 kg)] 246 lb 11.1 oz (111.9 kg) (04/30 1123)  Physical Exam: General: Alert and awake, oriented x3, not in any acute distress. HEENT: anicteric sclera, pupils reactive to light and accommodation, EOMI, oropharynx clear and without exudate  CVS regular rate, normal r, Iii/vi high pitched murmru radiating to the carotids,  Chest: fairly clear to auscultation bilaterally, no wheezing, rales or rhonchi  Abdomen: soft nontender, nondistended, normal bowel sounds,  Extremities: no clubbing or edema noted bilaterally  Skin: fistula site is without overt  purulence or fluctuance  Neuro: nonfocal, strength and sensation intact   Lab Results:  [facility] 05/08/11 0723  WBC 8.2  HGB 11.4*  HCT 34.2*  PLT 147*    BMET  Basename 05/08/11 0724  NA 133*  K 3.7  CL 96  CO2 25  GLUCOSE 141*  BUN 36*  CREATININE 7.58*  CALCIUM 8.8    Micro Results: Recent Results (from the past 240 hour(s))  CULTURE, BLOOD (ROUTINE X 2)     Status: Normal   Collection Time   05/03/11  3:30 AM      Component Value Range Status Comment   Specimen Description BLOOD LEFT ARM   Final    Special Requests BOTTLES DRAWN AEROBIC AND ANAEROBIC [facility] EACH   Final    Culture  Setup Time [id number]   Final    Culture     Final    Value: STAPHYLOCOCCUS AUREUS     Note: SUSCEPTIBILITIES PERFORMED ON PREVIOUS CULTURE WITHIN THE LAST 5 DAYS.     Note: Gram Stain Report Called to,Read Back By and Verified With: HILDA MOOSE @ 1957 ON 05/03/2011 HAJAM   Report Status 05/06/2011 FINAL   Final   CULTURE, BLOOD (ROUTINE X 2)     Status: Normal   Collection Time   05/03/11  3:45 AM      Component Value Range Status Comment   Specimen Description BLOOD LEFT HAND   Final    Special Requests BOTTLES DRAWN AEROBIC AND ANAEROBIC [facility]   Final    Culture  Setup Time [id number]   Final    Culture     Final    Value: STAPHYLOCOCCUS AUREUS     Note: RIFAMPIN AND GENTAMICIN SHOULD NOT BE USED AS SINGLE DRUGS FOR TREATMENT OF STAPH INFECTIONS. This organism DOES NOT demonstrate inducible Clindamycin resistance in vitro.     Note: Gram Stain Report Called to,Read Back By and Verified With: HILDA MOOSE @ 1957 ON 05/03/2011 HAJAM   Report Status 05/06/2011 FINAL   Final    Organism ID, Bacteria STAPHYLOCOCCUS AUREUS   Final   MRSA PCR SCREENING     Status: Normal   Collection Time   05/03/11  5:24 PM      Component Value Range Status Comment   MRSA by PCR NEGATIVE  NEGATIVE  Final   STOOL CULTURE     Status: Normal   Collection Time   05/04/11  4:19 AM      Component  Value Range Status Comment   Specimen Description STOOL   Final    Special Requests NONE   Final    Culture     Final  Value: NO SALMONELLA, SHIGELLA, CAMPYLOBACTER, OR YERSINIA ISOLATED   Report Status 05/07/2011 FINAL   Final   CULTURE, BLOOD (ROUTINE X 2)     Status: Normal (Preliminary result)   Collection Time   05/04/11  9:36 AM      Component Value Range Status Comment   Specimen Description BLOOD HAND RIGHT   Final    Special Requests BOTTLES DRAWN AEROBIC ONLY 8CC   Final    Culture  Setup Time [id number]   Final    Culture     Final    Value:        BLOOD CULTURE RECEIVED NO GROWTH TO DATE CULTURE WILL BE HELD FOR 5 DAYS BEFORE ISSUING A FINAL NEGATIVE REPORT   Report Status PENDING   Incomplete   CULTURE, BLOOD (ROUTINE X 2)     Status: Normal (Preliminary result)   Collection Time   05/04/11  9:47 AM      Component Value Range Status Comment   Specimen Description BLOOD FOOT LEFT   Final    Special Requests BOTTLES DRAWN AEROBIC ONLY 8CC   Final    Culture  Setup Time [id number]   Final    Culture     Final    Value:        BLOOD CULTURE RECEIVED NO GROWTH TO DATE CULTURE WILL BE HELD FOR 5 DAYS BEFORE ISSUING A FINAL NEGATIVE REPORT   Report Status PENDING   Incomplete   CLOSTRIDIUM DIFFICILE BY PCR     Status: Normal   Collection Time   05/04/11  5:00 PM      Component Value Range Status Comment   C difficile by pcr NEGATIVE  NEGATIVE  Final     Studies/Results: No results found.    Assessment/Plan: Alan [name] is a 78 y.o. male with 78 year old man with aortic stenosis, end-stage renal disease on hemodialysis via AV fistula and right upper extremity now with MS Staphylococcus aureus bacteremia   1) Staphylococcus aureus bacteremia:  --continue ancef --follow blood cultures to ensure clearance have been sent  --Patient needs a transesophageal echocardiogram done to assess his heart valves.  --treat for minimum of 6 weeks of iv ancef  #2 ?  Diskitis: based on MRI: would treat him for minimum of 6 weeks with IV antiotics with fu with [place] in [facility]  I will followup results of  TEE and arrange followup with myself in [facility] in next 5 weeks  Please call with further questions.     LOS: 5 days   Acey Lav 05/08/2011, 2:15 PM

## 2011-05-08 NOTE — Progress Notes (Signed)
[facility] Progress Note  Subjective: Hungry this am. Just ate snack on HD. Thinks they are going to look down his throat.  Objective Filed Vitals:   05/08/11 0700 05/08/11 0730 05/08/11 0800 05/08/11 0830  BP: 100/53 106/58 111/67 117/62  Pulse: 81 81 81 81  Temp:      TempSrc:      Resp: 18 18 18 18   Height:      Weight:      SpO2:       Physical Exam goal 3.5 General: NAD comfortable on HD Heart: RRR Lungs: no wheezes or rales Abdomen: soft NT Extremities:tr right LE edema Dialysis Access: right upper AVF Qb 400  Assessment/Plan: 1. MSSA bacteremia with ?early diskitis per MRI- changing to Ancef - 6 weeks total; ID has recommended TEE (explained test to pt); 2 D Echo neg 2. ESRD -TTS on HD 4 K bath 3. Anemia - Hgb steady decline; now 11.4; 6.25 Aranesp x 1; no IV Fe for now; d/c po Fe too 4. Secondary hyperparathyroidism - phos low; d/c tums; likely to improve on home diet 5. HTN/volume - controlled 6. Nutrition - high protein renal diet 7. Atrial fibrillation - Rate controlled on BB-INR therapeutic;  8. Diarrhea- probiotics  seem to be helping, C.difficile negative by PCR  Additional Objective Labs: Basic Metabolic Panel:  Lab 05/08/11 6962 05/05/11 0828 05/04/11 0355  NA 133* 136 138  K 3.7 3.9 3.7  CL 96 97 99  CO2 25 27 30   GLUCOSE 141* 96 63*  BUN 36* 33* 20  CREATININE 7.58* 6.64* 5.16*  CALCIUM 8.8 8.8 8.5  ALB -- -- --  PHOS 2.3 -- 2.4   Liver Function Tests:  Lab 05/08/11 0724 05/04/11 0355 05/03/11 0322  AST -- 39* 28  ALT -- 19 18  ALKPHOS -- 67 83  BILITOT -- 0.8 0.9  PROT -- 6.3 7.4  ALBUMIN 2.6* 2.8* 3.7  CBC:  Lab 05/08/11 0723 05/05/11 0828 05/04/11 0355 05/03/11 0322  WBC 8.2 10.1 12.2* --  NEUTROABS -- -- -- 5.0  HGB 11.4* 12.7* 12.6* --  HCT 34.2* 38.6* 38.0* --  MCV 90.5 92.3 91.6 92.2  PLT 147* 122* 123* --   Blood Culture    Component Value Date/Time   SDES BLOOD FOOT LEFT 05/04/2011 0947   SPECREQUEST  BOTTLES DRAWN AEROBIC ONLY 8CC 05/04/2011 0947   CULT        BLOOD CULTURE RECEIVED NO GROWTH TO DATE CULTURE WILL BE HELD FOR 5 DAYS BEFORE ISSUING A FINAL NEGATIVE REPORT 05/04/2011 0947   REPTSTATUS PENDING 05/04/2011 0947   Cardiac Enzymes:  Lab 05/03/11 0325  CKTOTAL 104  CKMB 1.2  CKMBINDEX --  TROPONINI <0.30   Lab Results  Component Value Date   INR 2.39* 05/07/2011   INR 2.54* 05/06/2011   INR 2.34* 05/05/2011   CBG:  Lab 05/07/11 2148 05/07/11 1633 05/07/11 1231 05/07/11 0649 05/06/11 0703  GLUCAP 129* 159* 180* 153* 168*   IMedications:      . allopurinol  150 mg Oral Daily  . aspirin EC  81 mg Oral Daily  . calcium carbonate  200 mg of elemental calcium Oral TID WC  .  ceFAZolin (ANCEF) IV  2 g Intravenous Q T,Th,Sat-1800  . ezetimibe-simvastatin  1 tablet Oral QHS  . ferrous fumarate  1 tablet Oral BID  . metoprolol tartrate  37.5 mg Oral BID  . multivitamin  1 tablet Oral QHS  . saccharomyces boulardii  250  mg Oral BID  . sodium chloride  3 mL Intravenous Q12H  . temazepam  15 mg Oral QHS  . warfarin  2.5 mg Oral Custom  . warfarin  5 mg Oral Custom  . Warfarin - Pharmacist Dosing Inpatient   Does not apply q1800  . DISCONTD: multivitamin  1 tablet Oral Daily  . DISCONTD: vancomycin  1,000 mg Intravenous Q T,Th,Sa-HD    I  have reviewed scheduled and prn medications.  Sheffield Slider, PA-C [facility] Beeper [phone]  05/08/2011,9:03 AM  LOS: 5 days

## 2011-05-08 NOTE — Progress Notes (Signed)
I have seen and examined this patient and agree with the assessment/plan as outlined above by [facility] PA. Ongoing IV antibiotic therapy for MSSA bacteremia- TEE pending to eval for SBE. Keelie Zemanek K.,MD 05/08/2011 9:41 AM

## 2011-05-08 NOTE — Progress Notes (Signed)
Subjective: Patient seen and examined this am. Has some back pain but feels fine overall. Has been arrange for TEE tomorrow  Objective:  Vital signs in last 24 hours:  Filed Vitals:   05/08/11 1000 05/08/11 1025 05/08/11 1044 05/08/11 1123  BP: 94/41 100/51 108/57 120/61  Pulse: 84 83 83 82  Temp:    97.1 F (36.2 C)  TempSrc:    Oral  Resp: 18 18 18 18   Height:      Weight:    111.9 kg (246 lb 11.1 oz)  SpO2:    95%    Intake/Output from previous day:   Intake/Output Summary (Last 24 hours) at 05/08/11 1428 Last data filed at 05/08/11 1123  Gross per 24 hour  Intake    360 ml  Output   2559 ml  Net  -2199 ml    Physical Exam:  General: , in no acute distress. HEENT: no pallor, no icterus, moist oral mucosa, no JVD, no lymphadenopathy Heart: Normal  s1 &s2  Regular rate and rhythm, without murmurs, rubs, gallops. Lungs: Clear to auscultation bilaterally. Abdomen: Soft, nontender, nondistended, positive bowel sounds. Extremities: No clubbing cyanosis or edema with positive pedal pulses.Left AV graft Neuro: Alert, awake, oriented x3, nonfocal.   Lab Results:  Basic Metabolic Panel:    Component Value Date/Time   NA 133* 05/08/2011 0724   K 3.7 05/08/2011 0724   CL 96 05/08/2011 0724   CO2 25 05/08/2011 0724   BUN 36* 05/08/2011 0724   CREATININE 7.58* 05/08/2011 0724   GLUCOSE 141* 05/08/2011 0724   CALCIUM 8.8 05/08/2011 0724   CALCIUM 8.8 10/27/2010 0734   CBC:    Component Value Date/Time   WBC 8.2 05/08/2011 0723   HGB 11.4* 05/08/2011 0723   HCT 34.2* 05/08/2011 0723   PLT 147* 05/08/2011 0723   MCV 90.5 05/08/2011 0723   NEUTROABS 5.0 05/03/2011 0322   LYMPHSABS 0.2* 05/03/2011 0322   MONOABS 0.0* 05/03/2011 0322   EOSABS 0.0 05/03/2011 0322   BASOSABS 0.0 05/03/2011 0322    Recent Results (from the past 240 hour(s))  CULTURE, BLOOD (ROUTINE X 2)     Status: Normal   Collection Time   05/03/11  3:30 AM      Component Value Range Status Comment   Specimen  Description BLOOD LEFT ARM   Final    Special Requests BOTTLES DRAWN AEROBIC AND ANAEROBIC [facility] EACH   Final    Culture  Setup Time [id number]   Final    Culture     Final    Value: STAPHYLOCOCCUS AUREUS     Note: SUSCEPTIBILITIES PERFORMED ON PREVIOUS CULTURE WITHIN THE LAST 5 DAYS.     Note: Gram Stain Report Called to,Read Back By and Verified With: HILDA MOOSE @ 1957 ON 05/03/2011 HAJAM   Report Status 05/06/2011 FINAL   Final   CULTURE, BLOOD (ROUTINE X 2)     Status: Normal   Collection Time   05/03/11  3:45 AM      Component Value Range Status Comment   Specimen Description BLOOD LEFT HAND   Final    Special Requests BOTTLES DRAWN AEROBIC AND ANAEROBIC [facility]   Final    Culture  Setup Time [id number]   Final    Culture     Final    Value: STAPHYLOCOCCUS AUREUS     Note: RIFAMPIN AND GENTAMICIN SHOULD NOT BE USED AS SINGLE DRUGS FOR TREATMENT OF STAPH INFECTIONS. This organism DOES NOT demonstrate inducible  Clindamycin resistance in vitro.     Note: Gram Stain Report Called to,Read Back By and Verified With: HILDA MOOSE @ 1957 ON 05/03/2011 HAJAM   Report Status 05/06/2011 FINAL   Final    Organism ID, Bacteria STAPHYLOCOCCUS AUREUS   Final   MRSA PCR SCREENING     Status: Normal   Collection Time   05/03/11  5:24 PM      Component Value Range Status Comment   MRSA by PCR NEGATIVE  NEGATIVE  Final   STOOL CULTURE     Status: Normal   Collection Time   05/04/11  4:19 AM      Component Value Range Status Comment   Specimen Description STOOL   Final    Special Requests NONE   Final    Culture     Final    Value: NO SALMONELLA, SHIGELLA, CAMPYLOBACTER, OR YERSINIA ISOLATED   Report Status 05/07/2011 FINAL   Final   CULTURE, BLOOD (ROUTINE X 2)     Status: Normal (Preliminary result)   Collection Time   05/04/11  9:36 AM      Component Value Range Status Comment   Specimen Description BLOOD HAND RIGHT   Final    Special Requests BOTTLES DRAWN AEROBIC ONLY 8CC   Final     Culture  Setup Time [id number]   Final    Culture     Final    Value:        BLOOD CULTURE RECEIVED NO GROWTH TO DATE CULTURE WILL BE HELD FOR 5 DAYS BEFORE ISSUING A FINAL NEGATIVE REPORT   Report Status PENDING   Incomplete   CULTURE, BLOOD (ROUTINE X 2)     Status: Normal (Preliminary result)   Collection Time   05/04/11  9:47 AM      Component Value Range Status Comment   Specimen Description BLOOD FOOT LEFT   Final    Special Requests BOTTLES DRAWN AEROBIC ONLY 8CC   Final    Culture  Setup Time [id number]   Final    Culture     Final    Value:        BLOOD CULTURE RECEIVED NO GROWTH TO DATE CULTURE WILL BE HELD FOR 5 DAYS BEFORE ISSUING A FINAL NEGATIVE REPORT   Report Status PENDING   Incomplete   CLOSTRIDIUM DIFFICILE BY PCR     Status: Normal   Collection Time   05/04/11  5:00 PM      Component Value Range Status Comment   C difficile by pcr NEGATIVE  NEGATIVE  Final     Studies/Results: No results found.  Medications: Scheduled Meds:   . allopurinol  150 mg Oral Daily  . aspirin EC  81 mg Oral Daily  . calcium carbonate  200 mg of elemental calcium Oral TID WC  .  ceFAZolin (ANCEF) IV  2 g Intravenous Q T,Th,Sat-1800  . darbepoetin (ARANESP) injection - DIALYSIS  6.5476 mcg Intravenous Once  . ezetimibe-simvastatin  1 tablet Oral QHS  .  HYDROmorphone (DILAUDID) injection  1 mg Intravenous Once  . metoprolol tartrate  37.5 mg Oral BID  . multivitamin  1 tablet Oral QHS  . saccharomyces boulardii  250 mg Oral BID  . sodium chloride  3 mL Intravenous Q12H  . temazepam  15 mg Oral QHS  . warfarin  2.5 mg Oral Custom  . warfarin  5 mg Oral Custom  . Warfarin - Pharmacist Dosing Inpatient   Does not apply q1800  .  DISCONTD: ferrous fumarate  1 tablet Oral BID  . DISCONTD: multivitamin  1 tablet Oral Daily   Continuous Infusions:  PRN Meds:.sodium chloride, acetaminophen, acetaminophen, bismuth subsalicylate, camphor-menthol, dextrose, docusate sodium, feeding  supplement (NEPRO CARB STEADY), heparin, heparin, hydrOXYzine, ibuprofen, ondansetron (ZOFRAN) IV, ondansetron, oxyCODONE, sodium chloride, sorbitol, traMADol  Assessment/Plan:  78 y/o male with HTN, DM, ESRD on HD, Afib on coumadin, CAD s/p CABG Presented with AMS on 4/25 and found to have fever with staph aureus ( MSSA)  bacteremia and sepsis and admitted to stepdown.  Sepsis - secondary  to staph aureas bactermia  *Likely source from dialysis access vs diskitis 2-D echo neg for endocarditis, planned for TEE tomorrow -started on vanco and now switched to ancef and ID Zenaida Niece dam ) recommend treating for at least 6 weeks *MRI ordered by Dr Arta Silence for back pain which demonstrating nonspecific edematous changes from T10-L5 with most pronounced changes at the L1-2 level. ID recommends to continue ancef and will follow up as outpt  Dehydration/Hypotension  resolved   Atrial fibrillation  *rate controlled  *Continue metoprolol at home dose  *INR  therapeutic   dosing Coumadin per pharmacy   Toxic metabolic encephalopathy  *Resolved with treatment of acute infection and hypotension   Diabetes mellitus with hypoglycemia  *improved after Amaryl d/c'd      Hx of Aortic stenosis  *Significant risk factor for endocarditis in setting of patient with gram-positive bacteremia  Follow with TEE  CAD (coronary artery disease)  Stable  End stage renal disease on dialysis  Renal following: HD on  Tuesdays Thursdays and Saturdays     Full code  Diet: diabetic/ renal    LOS: 5 days   Alan [name] 05/08/2011, 2:28 PM

## 2011-05-09 ENCOUNTER — Encounter (HOSPITAL_COMMUNITY): Payer: Self-pay | Admitting: *Deleted

## 2011-05-09 ENCOUNTER — Encounter (HOSPITAL_COMMUNITY)
Admission: EM | Disposition: A | Discharge status: Home Health | Payer: Self-pay | Source: Home / Self Care | Attending: Internal Medicine

## 2011-05-09 DIAGNOSIS — A4901 Methicillin susceptible Staphylococcus aureus infection, unspecified site: Secondary | ICD-10-CM

## 2011-05-09 DIAGNOSIS — E119 Type 2 diabetes mellitus without complications: Secondary | ICD-10-CM

## 2011-05-09 DIAGNOSIS — J189 Pneumonia, unspecified organism: Secondary | ICD-10-CM

## 2011-05-09 DIAGNOSIS — N186 End stage renal disease: Secondary | ICD-10-CM

## 2011-05-09 DIAGNOSIS — A419 Sepsis, unspecified organism: Secondary | ICD-10-CM

## 2011-05-09 DIAGNOSIS — I359 Nonrheumatic aortic valve disorder, unspecified: Secondary | ICD-10-CM

## 2011-05-09 HISTORY — PX: TEE WITHOUT CARDIOVERSION: SHX5443

## 2011-05-09 LAB — GLUCOSE, CAPILLARY: Glucose-Capillary: 152 mg/dL — ABNORMAL HIGH (ref 70–99)

## 2011-05-09 LAB — PROTIME-INR
INR: 2.57 — ABNORMAL HIGH (ref 0.00–1.49)
Prothrombin Time: 28 seconds — ABNORMAL HIGH (ref 11.6–15.2)

## 2011-05-09 SURGERY — ECHOCARDIOGRAM, TRANSESOPHAGEAL
Anesthesia: Moderate Sedation

## 2011-05-09 MED ORDER — FENTANYL CITRATE 0.05 MG/ML IJ SOLN
INTRAMUSCULAR | Status: DC | PRN
  Administered 2011-05-09: 25 ug via INTRAVENOUS
  Administered 2011-05-09: 10 ug via INTRAVENOUS

## 2011-05-09 MED ORDER — SODIUM CHLORIDE 0.9 % IV SOLN
250.0000 mL | INTRAVENOUS | Status: DC | PRN
  Administered 2011-05-09: 500 mL via INTRAVENOUS

## 2011-05-09 MED ORDER — HEPARIN SODIUM (PORCINE) 1000 UNIT/ML DIALYSIS
20.0000 [IU]/kg | INTRAMUSCULAR | Status: DC | PRN
  Administered 2011-05-10: 2300 [IU] via INTRAVENOUS_CENTRAL

## 2011-05-09 MED ORDER — MIDAZOLAM HCL 10 MG/2ML IJ SOLN
INTRAMUSCULAR | Status: DC | PRN
  Administered 2011-05-09: 2 mg via INTRAVENOUS
  Administered 2011-05-09: 1 mg via INTRAVENOUS

## 2011-05-09 MED ORDER — BENZOCAINE 20 % MT SOLN
1.0000 "application " | OROMUCOSAL | Status: DC | PRN
  Filled 2011-05-09: qty 57

## 2011-05-09 MED ORDER — MIDAZOLAM HCL 10 MG/2ML IJ SOLN
INTRAMUSCULAR | Status: AC
  Filled 2011-05-09: qty 2

## 2011-05-09 MED ORDER — MIDAZOLAM HCL 10 MG/2ML IJ SOLN: 10.0000 mg | Freq: Once | INTRAMUSCULAR | Status: DC

## 2011-05-09 MED ORDER — FENTANYL CITRATE 0.05 MG/ML IJ SOLN: 250.0000 ug | Freq: Once | INTRAMUSCULAR | Status: DC

## 2011-05-09 MED ORDER — SODIUM CHLORIDE 0.9 % IJ SOLN
3.0000 mL | Freq: Two times a day (BID) | INTRAMUSCULAR | Status: DC
  Administered 2011-05-11: 3 mL via INTRAVENOUS

## 2011-05-09 MED ORDER — FENTANYL CITRATE 0.05 MG/ML IJ SOLN
INTRAMUSCULAR | Status: AC
  Filled 2011-05-09: qty 2

## 2011-05-09 MED ORDER — SODIUM CHLORIDE 0.9 % IJ SOLN: 3.0000 mL | INTRAMUSCULAR | Status: DC | PRN

## 2011-05-09 MED ORDER — BUTAMBEN-TETRACAINE-BENZOCAINE 2-2-14 % EX AERO
INHALATION_SPRAY | CUTANEOUS | Status: DC | PRN
  Administered 2011-05-09: 2 via TOPICAL

## 2011-05-09 NOTE — Progress Notes (Signed)
   CARE MANAGEMENT NOTE 05/09/2011  Patient:  LAZARIUS, RIVKIN   Account Number:  [id number]  Date Initiated:  05/09/2011  Documentation initiated by:  Onnie Boer  Subjective/Objective Assessment:   PT WAS ADMITTED WITH SEPSIS     Action/Plan:   PROGRESSION OF CARE AND DISCHARGE PLANNING   Anticipated DC Date:  05/12/2011   Anticipated DC Plan:  SKILLED NURSING FACILITY  In-house referral  Clinical Social Worker      DC Planning Services  CM consult      Choice offered to / List presented to:             Status of service:  In process, will continue to follow Medicare Important Message given?   (If response is "NO", the following Medicare IM given date fields will be blank) Date Medicare IM given:   Date Additional Medicare IM given:    Discharge Disposition:    Per UR Regulation:  Reviewed for med. necessity/level of care/duration of stay  If discussed at Long Length of Stay Meetings, dates discussed:    Comments:  05/09/11 Onnie Boer, RN, BSN 1625 PT IS TO HAVE TEE TODAY AND WILL HAVE PT/OT EVAL ON TOMORROW.  WILL F/U.

## 2011-05-09 NOTE — Progress Notes (Signed)
I have seen and examined this patient and agree with the assessment/plan as outlined above by [facility] PA. Currently he is n.p.o. for anticipated transesophageal echocardiography to evaluate and rule out subacute bacterial endocarditis. He continues to have back pain on and off (apparently chronic) and will be on intravenous antibiotic therapy with Ancef for at least 6 weeks. Laith Antonelli K.,MD 05/09/2011 10:11 AM

## 2011-05-09 NOTE — Progress Notes (Signed)
[facility] Progress Note  Subjective:  I can't eat or drink anything; otherwise ok.  Objective Filed Vitals:   05/08/11 1123 05/08/11 1400 05/08/11 2119 05/09/11 0422  BP: 120/61 148/90 89/54 111/62  Pulse: 82 84 81 81  Temp: 97.1 F (36.2 C) 98.4 F (36.9 C) 98.8 F (37.1 C) 98.5 F (36.9 C)  TempSrc: Oral Oral Oral Oral  Resp: 18 18 18 18   Height:   6\' 1"  (1.854 m)   Weight: 111.9 kg (246 lb 11.1 oz)  114 kg (251 lb 5.2 oz)   SpO2: 95% 96% 95% 95%  05/08/11 2119 114 kg (251 lb 5.2 oz) 2.42 sq meters 33.2 MF 05/08/11 1123 111.9 kg (246 lb 11.1 oz) -- -- AW 05/08/11 0630 115 kg (253 lb 8.5 oz)  Physical Exam  General: NAD resting in bed Heart: RRR II/VI murmur Lungs: no wheezes or rales  Abdomen: soft NT  Extremities:no signif LE edema  Dialysis Access: right upper AVF patent  Dialysis Orders: Center: [facility] On TTS EDW 111 (left at 110.7 4/23) HD Bath 2K 2.2 5 Ca Time 4.25Heparin 3400 Access right upper AVF BFR 500 DFR A 1.5 Zemplar IV/HD Epogen none Venofer 100 q week   Assessment/Plan:  1. MSSA bacteremia with ?early diskitis per MRI- changing to Ancef - 6 weeks total; TEE planned today 2 D Echo neg  2. ESRD -TTS on HD - HD Thursday per routine if not d/c today 3. Anemia - Hgb steady decline; 4/30 11.4; 6.25 Aranesp x 1; no IV Fe for now; d/c po Fe too  4. Secondary hyperparathyroidism - phos low; d/c tums; likely to improve on home diet  5. HTN/volume - controlled; net UF 2.55 yesterday; but then increased last pm; hopefully error; EDW outpt was 111. 6. Nutrition - high protein renal diet; NPo at present 7. Atrial fibrillation - Rate controlled on BB-INR therapeutic;  8. Diarrhea- on probiotics , C.difficile negative by PCR  Additional Objective Labs: Basic Metabolic Panel:  Lab 05/08/11 4540 05/05/11 0828 05/04/11 0355  NA 133* 136 138  K 3.7 3.9 3.7  CL 96 97 99  CO2 25 27 30   GLUCOSE 141* 96 63*  BUN 36* 33* 20  CREATININE 7.58*  6.64* 5.16*  CALCIUM 8.8 8.8 8.5  ALB -- -- --  PHOS 2.3 -- 2.4   Liver Function Tests:  Lab 05/08/11 0724 05/04/11 0355 05/03/11 0322  AST -- 39* 28  ALT -- 19 18  ALKPHOS -- 67 83  BILITOT -- 0.8 0.9  PROT -- 6.3 7.4  ALBUMIN 2.6* 2.8* 3.7   Lab Results  Component Value Date   INR 2.57* 05/09/2011   INR 2.34* 05/08/2011   INR 2.39* 05/07/2011    CBC:  Lab 05/08/11 0723 05/05/11 0828 05/04/11 0355 05/03/11 0322  WBC 8.2 10.1 12.2* --  NEUTROABS -- -- -- 5.0  HGB 11.4* 12.7* 12.6* --  HCT 34.2* 38.6* 38.0* --  MCV 90.5 92.3 91.6 92.2  PLT 147* 122* 123* --   Blood Culture    Component Value Date/Time   SDES BLOOD FOOT LEFT 05/04/2011 0947   SPECREQUEST BOTTLES DRAWN AEROBIC ONLY 8CC 05/04/2011 0947   CULT        BLOOD CULTURE RECEIVED NO GROWTH TO DATE CULTURE WILL BE HELD FOR 5 DAYS BEFORE ISSUING A FINAL NEGATIVE REPORT 05/04/2011 0947   REPTSTATUS PENDING 05/04/2011 0947    Cardiac Enzymes:  Lab 05/03/11 0325  CKTOTAL 104  CKMB 1.2  CKMBINDEX --  TROPONINI <0.30   CBG:  Lab 05/09/11 0647 05/08/11 1657 05/08/11 1232 05/07/11 2148 05/07/11 1633  GLUCAP 152* 181* 154* 129* 159*   Iron Studies: No results found for this basename: IRON,TIBC,TRANSFERRIN,FERRITIN in the last 72 hours Studies/Results: No results found. Medications:      . allopurinol  150 mg Oral Daily  . aspirin EC  81 mg Oral Daily  . calcium carbonate  200 mg of elemental calcium Oral TID WC  .  ceFAZolin (ANCEF) IV  2 g Intravenous Q T,Th,Sat-1800  . darbepoetin (ARANESP) injection - DIALYSIS  6.5476 mcg Intravenous Once  . ezetimibe-simvastatin  1 tablet Oral QHS  .  HYDROmorphone (DILAUDID) injection  1 mg Intravenous Once  . metoprolol tartrate  37.5 mg Oral BID  . multivitamin  1 tablet Oral QHS  . saccharomyces boulardii  250 mg Oral BID  . sodium chloride  3 mL Intravenous Q12H  . temazepam  15 mg Oral QHS  . warfarin  2.5 mg Oral Custom  . warfarin  5 mg Oral Custom  . Warfarin  - Pharmacist Dosing Inpatient   Does not apply q1800  . DISCONTD: ferrous fumarate  1 tablet Oral BID    I  have reviewed scheduled and prn medications.  Sheffield Slider, PA-C [facility] Beeper [phone]  05/09/2011,9:00 AM  LOS: 6 days

## 2011-05-09 NOTE — Procedures (Signed)
Procedure: TEE  Indication: Rule out endocarditis  Sedation: 3 mg IV Versed, 35 mcg IV Fentanyl  Findings: See echo report section for full details.  No evidence for endocarditis noted.  The mitral valve was calcified. The aortic valve was heavily calcified with restricted motion.  By TEE, I obtained mean gradient of 30 mmHg, suggesting moderate AS.  EF was 55-60%.  We are going to do additional transthoracic views of the aortic valve to see if AS is actually severe.   No complications.   Marca Ancona 05/09/2011 11:33 AM

## 2011-05-09 NOTE — Progress Notes (Addendum)
Patient ID: Alan [name], male   DOB: 1931/03/11, 78 y.o.   MRN: [id number]  Subjective: No events overnight. Patient reports poor oral intake, appetite.  Objective:  Vital signs in last 24 hours:  Filed Vitals:   05/09/11 1215 05/09/11 1220 05/09/11 1245 05/09/11 1720  BP: 142/95 142/95 140/90 135/66  Pulse:   80 87  Temp:   98.4 F (36.9 C) 98.5 F (36.9 C)  SpO2: 99% 99% 97% 97%    Intake/Output from previous day:  Intake/Output Summary (Last 24 hours) at 05/09/11 1928 Last data filed at 05/09/11 1300  Gross per 24 hour  Intake    770 ml  Output      0 ml  Net    770 ml   Physical Exam: General: Alert, awake, follows commands appropriately, in no acute distress. HEENT: No bruits, no goiter. Moist mucous membranes, no scleral icterus, no conjunctival pallor. Heart: Regular rate and rhythm, S1/S2 +, no murmurs, rubs, gallops. Lungs: Clear to auscultation bilaterally. No wheezing, no rhonchi, no rales.  Abdomen: Soft, nontender, nondistended, positive bowel sounds. Extremities: No clubbing or cyanosis, no pitting edema,  positive pedal pulses. Neuro: Grossly nonfocal.  Lab Results:  Lab 05/08/11 0723 05/05/11 0828 05/04/11 0355 05/03/11 0322  WBC 8.2 10.1 12.2* 5.2  HGB 11.4* 12.7* 12.6* 13.9  HCT 34.2* 38.6* 38.0* 41.6  PLT 147* 122* 123* 142*    Lab 05/08/11 0724 05/05/11 0828 05/04/11 0355 05/03/11 0322  NA 133* 136 138 135  K 3.7 3.9 3.7 4.4  CL 96 97 99 93*  CO2 25 27 30 26   GLUCOSE 141* 96 63* 191*  BUN 36* 33* 20 28*  CREATININE 7.58* 6.64* 5.16* 6.95*  CALCIUM 8.8 8.8 8.5 9.1  MG -- -- 1.9 --    Lab 05/09/11 0515 05/08/11 1400 05/07/11 0357 05/06/11 0410 05/05/11 0430  INR 2.57* 2.34* 2.39* 2.54* 2.34*  PROTIME -- -- -- -- --   Cardiac markers:  Lab 05/03/11 0325  CKMB 1.2  TROPONINI <0.30  MYOGLOBIN --   Recent Results (from the past 240 hour(s))  CULTURE, BLOOD (ROUTINE X 2)     Status: Normal   Collection Time   05/03/11  3:30 AM        Component Value Range Status Comment   Specimen Description BLOOD LEFT ARM   Final    Special Requests BOTTLES DRAWN AEROBIC AND ANAEROBIC [facility] EACH   Final    Culture  Setup Time [id number]   Final    Culture     Final    Value: STAPHYLOCOCCUS AUREUS     Note: SUSCEPTIBILITIES PERFORMED ON PREVIOUS CULTURE WITHIN THE LAST 5 DAYS.     Note: Gram Stain Report Called to,Read Back By and Verified With: HILDA MOOSE @ 1957 ON 05/03/2011 HAJAM   Report Status 05/06/2011 FINAL   Final   CULTURE, BLOOD (ROUTINE X 2)     Status: Normal   Collection Time   05/03/11  3:45 AM      Component Value Range Status Comment   Specimen Description BLOOD LEFT HAND   Final    Special Requests BOTTLES DRAWN AEROBIC AND ANAEROBIC [facility]   Final    Culture  Setup Time [id number]   Final    Culture     Final    Value: STAPHYLOCOCCUS AUREUS     Note: RIFAMPIN AND GENTAMICIN SHOULD NOT BE USED AS SINGLE DRUGS FOR TREATMENT OF STAPH INFECTIONS. This organism DOES NOT demonstrate inducible  Clindamycin resistance in vitro.     Note: Gram Stain Report Called to,Read Back By and Verified With: HILDA MOOSE @ 1957 ON 05/03/2011 HAJAM   Report Status 05/06/2011 FINAL   Final    Organism ID, Bacteria STAPHYLOCOCCUS AUREUS   Final   MRSA PCR SCREENING     Status: Normal   Collection Time   05/03/11  5:24 PM      Component Value Range Status Comment   MRSA by PCR NEGATIVE  NEGATIVE  Final   STOOL CULTURE     Status: Normal   Collection Time   05/04/11  4:19 AM      Component Value Range Status Comment   Specimen Description STOOL   Final    Special Requests NONE   Final    Culture     Final    Value: NO SALMONELLA, SHIGELLA, CAMPYLOBACTER, OR YERSINIA ISOLATED   Report Status 05/07/2011 FINAL   Final   CULTURE, BLOOD (ROUTINE X 2)     Status: Normal (Preliminary result)   Collection Time   05/04/11  9:36 AM      Component Value Range Status Comment   Specimen Description BLOOD HAND RIGHT   Final    Special  Requests BOTTLES DRAWN AEROBIC ONLY 8CC   Final    Culture  Setup Time [id number]   Final    Culture     Final    Value:        BLOOD CULTURE RECEIVED NO GROWTH TO DATE CULTURE WILL BE HELD FOR 5 DAYS BEFORE ISSUING A FINAL NEGATIVE REPORT   Report Status PENDING   Incomplete   CULTURE, BLOOD (ROUTINE X 2)     Status: Normal (Preliminary result)   Collection Time   05/04/11  9:47 AM      Component Value Range Status Comment   Specimen Description BLOOD FOOT LEFT   Final    Special Requests BOTTLES DRAWN AEROBIC ONLY 8CC   Final    Culture  Setup Time [id number]   Final    Culture     Final    Value:        BLOOD CULTURE RECEIVED NO GROWTH TO DATE CULTURE WILL BE HELD FOR 5 DAYS BEFORE ISSUING A FINAL NEGATIVE REPORT   Report Status PENDING   Incomplete   CLOSTRIDIUM DIFFICILE BY PCR     Status: Normal   Collection Time   05/04/11  5:00 PM      Component Value Range Status Comment   C difficile by pcr NEGATIVE  NEGATIVE  Final     Studies/Results: No results found.  Medications: Scheduled Meds:   . allopurinol  150 mg Oral Daily  . aspirin EC  81 mg Oral Daily  . calcium carbonate  200 mg calcium Oral TID WC  .  ceFAZolin (ANCEF) IV  2 g Intravenous Q T,Th,Sat-1800  . ezetimibe-simvastatin  1 tablet Oral QHS  . fentaNYL  250 mcg Intravenous Once  . metoprolol tartrate  37.5 mg Oral BID  . midazolam  10 mg Intravenous Once  . multivitamin  1 tablet Oral QHS  . saccharomyces boulardii  250 mg Oral BID  . temazepam  15 mg Oral QHS  . warfarin  2.5 mg Oral Custom  . warfarin  5 mg Oral Custom   Continuous Infusions:  PRN Meds:.sodium chloride, sodium chloride, acetaminophen, acetaminophen, benzocaine, bismuth subsalicylate, butamben-tetracaine-benzocaine, camphor-menthol, dextrose, docusate sodium, feeding supplement (NEPRO CARB STEADY), fentaNYL, heparin, hydrOXYzine, ibuprofen, midazolam, ondansetron (ZOFRAN)  IV, ondansetron, oxyCODONE, sodium chloride, sodium chloride,  sorbitol, traMADol, DISCONTD: heparin, DISCONTD: heparin  Assessment/Plan:  Principal Problem:  *Sepsis - this is secondary to MSSA bacteremia - pt clinically improving - appreciate ID input, recommendation is to continue Ancef IV for total of 6 weeks therapy - please note that TEE negative for endocarditis, repeat BCx (04/26 with NGTD)  Active Problems:  Atrial fibrillation - this has been rate controlled and stable - continue Coumadin per pharmacy   Aortic stenosis - per cardiology recommendations   End stage renal disease on dialysis - on HD TTS, this will continue as scheduled while pt is in the hospital   Toxic metabolic encephalopathy - likely secondary to MSSA bacteremia - pt clinically improving and this is now resolved   Diabetes mellitus - stable of oral medications - will continue to monitor as per floor protocol   Chronic anticoagulation - continue Coumadin per pharmacy  Anemia of chronic disease - Hg/Hct remain stable and at pt's baseline - CBC in AM  Diarrhea - now improved - C.Diff by PCR negative   EDUCATION - test results and diagnostic studies were discussed with patient  - patient verbalized the understanding - questions were answered at the bedside and contact information was provided for additional questions or concerns   LOS: 6 days   Swathi Dauphin 05/09/2011, 7:28 PM  [facility] Pager: [phone]

## 2011-05-09 NOTE — Progress Notes (Signed)
Physical Therapy Note:   05/09/11 1458  PT Visit Information  Reason Eval/Treat Not Completed Patient refused; declined mobility/amb today  PT - Plan  Comments Will plan to follow up tomorrow for PT eval    [place], [place] [phone]

## 2011-05-09 NOTE — Progress Notes (Signed)
ANTICOAGULATION & ANTIBIOTIC CONSULT NOTE - Follow Up Consult  Pharmacy Consult for Coumadin, Ancef Indication: Chronic Afib; MSSA bacteremia  No Known Allergies  Patient Measurements: Height: 6\' 1"  (185.4 cm) Weight: 251 lb 5.2 oz (114 kg) IBW/kg (Calculated) : 79.9   Vital Signs: Temp: 98.5 F (36.9 C) (05/01 0422) Temp src: Oral (05/01 0422) BP: 111/62 mmHg (05/01 0422) Pulse Rate: 81  (05/01 0422)  Labs:  Basename 05/09/11 0515 05/08/11 1400 05/08/11 0724 05/08/11 0723 05/07/11 0357  HGB -- -- -- 11.4* --  HCT -- -- -- 34.2* --  PLT -- -- -- 147* --  APTT -- -- -- -- --  LABPROT 28.0* 26.0* -- -- 26.5*  INR 2.57* 2.34* -- -- 2.39*  HEPARINUNFRC -- -- -- -- --  CREATININE -- -- 7.58* -- --  CKTOTAL -- -- -- -- --  CKMB -- -- -- -- --  TROPONINI -- -- -- -- --   Estimated Creatinine Clearance: 10.8 ml/min (by C-G formula based on Cr of 7.58).   Medications:  Anti-infectives     Start     Dose/Rate Route Frequency Ordered Stop   05/08/11 2200   ceFAZolin (ANCEF) IVPB 2 g/50 mL premix        2 g 100 mL/hr over 30 Minutes Intravenous Every T-Th-Sa (1800) 05/07/11 1249     05/05/11 1500   levofloxacin (LEVAQUIN) IVPB 500 mg  Status:  Discontinued        500 mg 100 mL/hr over 60 Minutes Intravenous Every 48 hours 05/03/11 1424 05/04/11 0843   05/05/11 1200   vancomycin (VANCOCIN) IVPB 1000 mg/200 mL premix  Status:  Discontinued        1,000 mg 200 mL/hr over 60 Minutes Intravenous Every T-Th-Sa (Hemodialysis) 05/03/11 1645 05/07/11 1203   05/03/11 1430   levofloxacin (LEVAQUIN) IVPB 750 mg        750 mg 100 mL/hr over 90 Minutes Intravenous To [facility] 05/03/11 1424 05/03/11 2330   05/03/11 1400   piperacillin-tazobactam (ZOSYN) IVPB 2.25 g  Status:  Discontinued        2.25 g 100 mL/hr over 30 Minutes Intravenous Every 8 hours 05/03/11 1325 05/04/11 0843   05/03/11 1330   vancomycin (VANCOCIN) IVPB 1000 mg/200 mL premix        1,000 mg 200  mL/hr over 60 Minutes Intravenous To [facility] 05/03/11 1325 05/04/11 1330   05/03/11 0330   piperacillin-tazobactam (ZOSYN) IVPB 3.375 g        3.375 g 12.5 mL/hr over 240 Minutes Intravenous  Once 05/03/11 0323 05/03/11 0802   05/03/11 0330   vancomycin (VANCOCIN) IVPB 1000 mg/200 mL premix        1,000 mg 200 mL/hr over 60 Minutes Intravenous  Once 05/03/11 0323 05/03/11 0609          Assessment: 78 y.o. M on warfarin for hx Afib with a therapeutic INR this a.m (INR 2.57 << 2.34, goal of 2-3). No CBC today. No s/sx of bleeding noted.   This patient is also on Cefazolin for MSSA bacteremia -- per ID to get ~6 weeks due to diskitis shown on MRI. Dose remains appropriate at this time  Goal of Therapy:  INR 2-3   Plan:  1. Continue warfarin 2.5 mg daily EXCEPT for 5 mg on TThSu 2. Continue Ancef 2g IV post HD on HD days 3. Will continue to monitor for any signs/symptoms of bleeding and will follow up with PT/INR in the  a.m.   Georgina Pillion, PharmD, BCPS Clinical Pharmacist Pager: [phone] 05/09/2011 9:37 AM

## 2011-05-09 NOTE — Progress Notes (Signed)
*  PRELIMINARY RESULTS* Echocardiogram Echocardiogram Transesophageal has been performed.  Katheren Puller 05/09/2011, 2:35 PM

## 2011-05-09 NOTE — Interval H&P Note (Signed)
History and Physical Interval Note:  05/09/2011 10:58 AM  Alan [name]  has presented today for surgery, with the diagnosis of r/o vegetation  The various methods of treatment have been discussed with the patient and family. After consideration of risks, benefits and other options for treatment, the patient has consented to  Procedure(s) (LRB): TRANSESOPHAGEAL ECHOCARDIOGRAM (TEE) (N/A) as a surgical intervention .  The patients' history has been reviewed, patient examined, no change in status, stable for surgery.  I have reviewed the patients' chart and labs.  Questions were answered to the patient's satisfaction.     [name] [employer]

## 2011-05-09 NOTE — Progress Notes (Signed)
INFECTIOUS DISEASE PROGRESS NOTE  ID: Alan [name] is a 78 y.o. male with  Principal Problem:  *Sepsis Active Problems:  Atrial fibrillation  Aortic stenosis  CAD (coronary artery disease)  End stage renal disease on dialysis  Healthcare-associated pneumonia  Toxic metabolic encephalopathy  Bacteremia due to Gram-positive bacteria  Dehydration  Diabetes mellitus  Chronic anticoagulation  Hypotension  Subjective: Without complaints  Abtx:  Anti-infectives     Start     Dose/Rate Route Frequency Ordered Stop   05/08/11 2200   ceFAZolin (ANCEF) IVPB 2 g/50 mL premix        2 g 100 mL/hr over 30 Minutes Intravenous Every T-Th-Sa (1800) 05/07/11 1249     05/05/11 1500   levofloxacin (LEVAQUIN) IVPB 500 mg  Status:  Discontinued        500 mg 100 mL/hr over 60 Minutes Intravenous Every 48 hours 05/03/11 1424 05/04/11 0843   05/05/11 1200   vancomycin (VANCOCIN) IVPB 1000 mg/200 mL premix  Status:  Discontinued        1,000 mg 200 mL/hr over 60 Minutes Intravenous Every T-Th-Sa (Hemodialysis) 05/03/11 1645 05/07/11 1203   05/03/11 1430   levofloxacin (LEVAQUIN) IVPB 750 mg        750 mg 100 mL/hr over 90 Minutes Intravenous [facility] 05/03/11 1424 05/03/11 2330   05/03/11 1400   piperacillin-tazobactam (ZOSYN) IVPB 2.25 g  Status:  Discontinued        2.25 g 100 mL/hr over 30 Minutes Intravenous Every 8 hours 05/03/11 1325 05/04/11 0843   05/03/11 1330   vancomycin (VANCOCIN) IVPB 1000 mg/200 mL premix        1,000 mg 200 mL/hr over 60 Minutes Intravenous [facility] 05/03/11 1325 05/04/11 1330   05/03/11 0330  piperacillin-tazobactam (ZOSYN) IVPB 3.375 g       3.375 g 12.5 mL/hr over 240 Minutes Intravenous  Once 05/03/11 0323 05/03/11 0802   05/03/11 0330   vancomycin (VANCOCIN) IVPB 1000 mg/200 mL premix        1,000 mg 200 mL/hr over 60 Minutes Intravenous  Once 05/03/11 0323 05/03/11 0609          Medications:  Scheduled:    . allopurinol  150 mg Oral Daily  . aspirin EC  81 mg Oral Daily  . calcium carbonate  200 mg of elemental calcium Oral TID WC  .  ceFAZolin (ANCEF) IV  2 g Intravenous Q T,Th,Sat-1800  . ezetimibe-simvastatin  1 tablet Oral QHS  . fentaNYL  250 mcg Intravenous Once  . metoprolol tartrate  37.5 mg Oral BID  . midazolam  10 mg Intravenous Once  . multivitamin  1 tablet Oral QHS  . saccharomyces boulardii  250 mg Oral BID  . sodium chloride  3 mL Intravenous Q12H  . sodium chloride  3 mL Intravenous Q12H  . temazepam  15 mg Oral QHS  . warfarin  2.5 mg Oral Custom  . warfarin  5 mg Oral Custom  . Warfarin - Pharmacist Dosing Inpatient   Does not apply q1800    Objective: Vital signs in last 24 hours: Temp:  [97.6 F (36.4 C)-98.8 F (37.1 C)] 98.4 F (36.9 C) (05/01 1245) Pulse Rate:  [79-81] 80  (05/01 1245) Resp:  [9-21] 18  (05/01 1245) BP: (58-144)/(31-97) 140/90 mmHg (05/01 1245) SpO2:  [90 %-100 %] 97 % (05/01 1245) Weight:  [114 kg (251 lb 5.2 oz)] 114 kg (251 lb 5.2 oz) (04/30 2119)  General appearance: alert, cooperative and no distress Resp: clear to auscultation bilaterally Cardio: regular rate and rhythm GI: normal findings: bowel sounds normal and soft, non-tender  Lab Results  Basename 05/08/11 0724 05/08/11 0723  WBC -- 8.2  HGB -- 11.4*  HCT -- 34.2*  NA 133* --  K 3.7 --  CL 96 --  CO2 25 --  BUN 36* --  CREATININE 7.58* --  GLU -- --   Liver Panel  Basename 05/08/11 0724  PROT --  ALBUMIN 2.6*  AST --  ALT --  ALKPHOS --  BILITOT --  BILIDIR --  IBILI --   Sedimentation Rate No results found for this basename: ESRSEDRATE in the last 72 hours C-Reactive Protein No results found for this basename: CRP:2 in the last 72 hours  Microbiology: Recent Results (from the past 240 hour(s))  CULTURE, BLOOD (ROUTINE X 2)     Status: Normal   Collection Time   05/03/11  3:30 AM      Component Value Range Status Comment   Specimen  Description BLOOD LEFT ARM   Final    Special Requests BOTTLES DRAWN AEROBIC AND ANAEROBIC [facility] EACH   Final    Culture  Setup Time [id number]   Final    Culture     Final    Value: STAPHYLOCOCCUS AUREUS     Note: SUSCEPTIBILITIES PERFORMED ON PREVIOUS CULTURE WITHIN THE LAST 5 DAYS.     Note: Gram Stain Report Called to,Read Back By and Verified With: HILDA MOOSE @ 1957 ON 05/03/2011 HAJAM   Report Status 05/06/2011 FINAL   Final   CULTURE, BLOOD (ROUTINE X 2)     Status: Normal   Collection Time   05/03/11  3:45 AM      Component Value Range Status Comment   Specimen Description BLOOD LEFT HAND   Final    Special Requests BOTTLES DRAWN AEROBIC AND ANAEROBIC [facility]   Final    Culture  Setup Time [id number]   Final    Culture     Final    Value: STAPHYLOCOCCUS AUREUS     Note: RIFAMPIN AND GENTAMICIN SHOULD NOT BE USED AS SINGLE DRUGS FOR TREATMENT OF STAPH INFECTIONS. This organism DOES NOT demonstrate inducible Clindamycin resistance in vitro.     Note: Gram Stain Report Called to,Read Back By and Verified With: HILDA MOOSE @ 1957 ON 05/03/2011 HAJAM   Report Status 05/06/2011 FINAL   Final    Organism ID, Bacteria STAPHYLOCOCCUS AUREUS   Final   MRSA PCR SCREENING     Status: Normal   Collection Time   05/03/11  5:24 PM      Component Value Range Status Comment   MRSA by PCR NEGATIVE  NEGATIVE  Final   STOOL CULTURE     Status: Normal   Collection Time   05/04/11  4:19 AM      Component Value Range Status Comment   Specimen Description STOOL   Final    Special Requests NONE   Final    Culture     Final    Value: NO SALMONELLA, SHIGELLA, CAMPYLOBACTER, OR YERSINIA ISOLATED   Report Status 05/07/2011 FINAL   Final   CULTURE, BLOOD (ROUTINE X 2)     Status: Normal (Preliminary result)   Collection Time   05/04/11  9:36 AM      Component Value Range Status Comment   Specimen Description BLOOD HAND RIGHT   Final    Special Requests BOTTLES  DRAWN AEROBIC ONLY 8CC   Final     Culture  Setup Time [id number]   Final    Culture     Final    Value:        BLOOD CULTURE RECEIVED NO GROWTH TO DATE CULTURE WILL BE HELD FOR 5 DAYS BEFORE ISSUING A FINAL NEGATIVE REPORT   Report Status PENDING   Incomplete   CULTURE, BLOOD (ROUTINE X 2)     Status: Normal (Preliminary result)   Collection Time   05/04/11  9:47 AM      Component Value Range Status Comment   Specimen Description BLOOD FOOT LEFT   Final    Special Requests BOTTLES DRAWN AEROBIC ONLY 8CC   Final    Culture  Setup Time [id number]   Final    Culture     Final    Value:        BLOOD CULTURE RECEIVED NO GROWTH TO DATE CULTURE WILL BE HELD FOR 5 DAYS BEFORE ISSUING A FINAL NEGATIVE REPORT   Report Status PENDING   Incomplete   CLOSTRIDIUM DIFFICILE BY PCR     Status: Normal   Collection Time   05/04/11  5:00 PM      Component Value Range Status Comment   C difficile by pcr NEGATIVE  NEGATIVE  Final     Studies/Results: No results found.   Assessment/Plan: MSSA Bacteremia (05-03-11 BCx 2/2; repeat BCx 4-26 NGTD) MRI showing changes at T10-11, T11-12, L1-2 and L4-5 of early osteo/discitis TEE (-) for vegitations Would continue IV ancef for 6 weeks, today is 6/42 He will f/u in ID clinic with Dr Daiva Eves to determine if further rx /repeat MRI warranted. Available if questions  Johny Sax Infectious Diseases [phone] 05/09/2011, 5:05 PM   LOS: 6 days

## 2011-05-10 ENCOUNTER — Inpatient Hospital Stay (HOSPITAL_COMMUNITY): Payer: MEDICARE

## 2011-05-10 ENCOUNTER — Encounter (HOSPITAL_COMMUNITY): Payer: Self-pay | Admitting: Cardiology

## 2011-05-10 ENCOUNTER — Telehealth: Payer: Self-pay | Admitting: Cardiology

## 2011-05-10 DIAGNOSIS — E119 Type 2 diabetes mellitus without complications: Secondary | ICD-10-CM

## 2011-05-10 DIAGNOSIS — A4901 Methicillin susceptible Staphylococcus aureus infection, unspecified site: Secondary | ICD-10-CM

## 2011-05-10 DIAGNOSIS — N186 End stage renal disease: Secondary | ICD-10-CM

## 2011-05-10 DIAGNOSIS — A419 Sepsis, unspecified organism: Secondary | ICD-10-CM

## 2011-05-10 LAB — CBC
MCH: 29.9 pg (ref 26.0–34.0)
MCHC: 32.8 g/dL (ref 30.0–36.0)
MCV: 91.3 fL (ref 78.0–100.0)
Platelets: 213 10*3/uL (ref 150–400)
RDW: 14 % (ref 11.5–15.5)

## 2011-05-10 LAB — GLUCOSE, CAPILLARY: Glucose-Capillary: 157 mg/dL — ABNORMAL HIGH (ref 70–99)

## 2011-05-10 LAB — CULTURE, BLOOD (ROUTINE X 2)
Culture  Setup Time: 201304261435
Culture: NO GROWTH

## 2011-05-10 LAB — RENAL FUNCTION PANEL
Albumin: 2.6 g/dL — ABNORMAL LOW (ref 3.5–5.2)
BUN: 34 mg/dL — ABNORMAL HIGH (ref 6–23)
Calcium: 8.7 mg/dL (ref 8.4–10.5)
Chloride: 97 mEq/L (ref 96–112)
Creatinine, Ser: 7.02 mg/dL — ABNORMAL HIGH (ref 0.50–1.35)
GFR calc non Af Amer: 7 mL/min — ABNORMAL LOW (ref >90)
Phosphorus: 3.2 mg/dL (ref 2.3–4.6)

## 2011-05-10 LAB — PROTIME-INR
INR: 2.41 — ABNORMAL HIGH (ref 0.00–1.49)
Prothrombin Time: 26.6 seconds — ABNORMAL HIGH (ref 11.6–15.2)

## 2011-05-10 MED ORDER — OXYCODONE HCL 5 MG PO TABS
ORAL_TABLET | ORAL | Status: AC
  Administered 2011-05-10: 5 mg via ORAL
  Filled 2011-05-10: qty 1

## 2011-05-10 MED ORDER — HYDROCORTISONE ACETATE 25 MG RE SUPP
25.0000 mg | Freq: Two times a day (BID) | RECTAL | Status: DC
  Administered 2011-05-11 – 2011-05-12 (×4): 25 mg via RECTAL
  Filled 2011-05-10 (×6): qty 1

## 2011-05-10 MED ORDER — HYDROCORTISONE ACETATE 25 MG RE SUPP: 25.0000 mg | Freq: Two times a day (BID) | RECTAL | Status: DC

## 2011-05-10 NOTE — Progress Notes (Signed)
In hemo 

## 2011-05-10 NOTE — Procedures (Signed)
I was present at this session.  I have reviewed the session itself and made appropriate changes.  Alan [name] K. 5/2/20139:18 AM

## 2011-05-10 NOTE — Care Management Note (Signed)
    Page 1 of 1   05/11/2011     4:42:40 PM   CARE MANAGEMENT NOTE 05/11/2011  Patient:  Alan [name], Alan [name]   Account Number:  [id number]  Date Initiated:  05/09/2011  Documentation initiated by:  Onnie Boer  Subjective/Objective Assessment:   PT WAS ADMITTED WITH SEPSIS     Action/Plan:   PROGRESSION OF CARE AND DISCHARGE PLANNING   Anticipated DC Date:  05/12/2011   Anticipated DC Plan:  SKILLED NURSING FACILITY  In-house referral  Clinical Social Worker      DC Planning Services  CM consult      Choice offered to / List presented to:             Status of service:  In process, will continue to follow Medicare Important Message given?   (If response is "NO", the following Medicare IM given date fields will be blank) Date Medicare IM given:   Date Additional Medicare IM given:    Discharge Disposition:    Per UR Regulation:  Reviewed for med. necessity/level of care/duration of stay  If discussed at Long Length of Stay Meetings, dates discussed:    Comments:  05/11/11 Onnie Boer, RN, BSN 1641 PT WILL NEED HH PT ARRANGED AT DC.  D/C WAS DELAYED TODAY D/T DECREASED BP.  WILL F/U.  05/10/11 Onnie Boer, RN, BSN 1535 PT NEEDS TO PARTICIPATE WITH PT TODAY AS HE IS MEDICALLY READY FOR DC, WILL NEED EVAL TO DETERMINE A SAFE DC PLAN. PT HAVING HD TODAY.  HOPEFULLY CAN DC LATER THIS EVENING.  05/09/11 Onnie Boer, RN, BSN 1625 PT IS TO HAVE TEE TODAY AND WILL HAVE PT/OT EVAL ON TOMORROW.  WILL F/U.

## 2011-05-10 NOTE — Progress Notes (Signed)
[facility] Progress Note  Subjective: Asking for a snack try on HD; hunger; thinks the coumadin makes him cold.  Objective Filed Vitals:   05/10/11 0500 05/10/11 0715 05/10/11 0748 05/10/11 0815  BP: 111/56 105/68 124/59 121/62  Pulse: 79 82 81 81  Temp: 98.7 F (37.1 C) 98 F (36.7 C)    TempSrc: Oral Oral    Resp: 18 16    Height:      Weight:  114 kg (251 lb 5.2 oz)    SpO2: 96% 95%     Physical Exam on HD goal 3.5  Wt 114  General: NAD dozing, easily arouses Heart: RRR II/VI murmur  Lungs: no wheezes or rales  Abdomen: soft NT  Extremities:no signif LE edema  Dialysis Access: right upper AVF patent  Qb 350  Dialysis Orders: Center: [facility] On TTS EDW 111 (left at 110.7 4/23) HD Bath 2K 2.2 5 Ca Time 4.25 Heparin 3400 Access right upper AVF BFR 500 DFR A 1.5 Zemplar IV/HD Epogen none Venofer 100 q week   Assessment/Plan:  1. MSSA bacteremia with ?early diskitis per MRI- changing to Ancef - 6 weeks (7/42 today); will give at outpt HD center ;  neg TEE 2. ESRD -TTS on HD - HD Thursday per routine3. Anemia - Hgb steady decline -anemia contributory to being cold;  4/30 11.4; 6.25 Aranesp x 1; no IV Fe for now; d/c po Fe too; CBC pending 4. Secondary hyperparathyroidism - phos low; d/c tums; likely to improve on home diet  5. HTN/volume - controlled; net UF 2.55 yesterday; but then increased last pm; hopefully error; EDW outpt was 111.  6. Nutrition - high protein renal diet;  7. Atrial fibrillation - Rate controlled on BB-INR therapeutic;  8. Diarrhea- on probiotics , C.difficile negative by PCR 9. Disp - ok per renal for d/c   Additional Objective Labs: Basic Metabolic Panel:  Lab 05/08/11 1610 05/05/11 0828 05/04/11 0355  NA 133* 136 138  K 3.7 3.9 3.7  CL 96 97 99  CO2 25 27 30   GLUCOSE 141* 96 63*  BUN 36* 33* 20  CREATININE 7.58* 6.64* 5.16*  CALCIUM 8.8 8.8 8.5  ALB -- -- --  PHOS 2.3 -- 2.4   Liver Function Tests:  Lab 05/08/11  0724 05/04/11 0355  AST -- 39*  ALT -- 19  ALKPHOS -- 67  BILITOT -- 0.8  PROT -- 6.3  ALBUMIN 2.6* 2.8*   Lab Results  Component Value Date   INR 2.57* 05/09/2011   INR 2.34* 05/08/2011   INR 2.39* 05/07/2011    CBC:  Lab 05/08/11 0723 05/05/11 0828 05/04/11 0355  WBC 8.2 10.1 12.2*  NEUTROABS -- -- --  HGB 11.4* 12.7* 12.6*  HCT 34.2* 38.6* 38.0*  MCV 90.5 92.3 91.6  PLT 147* 122* 123*   Blood Culture    Component Value Date/Time   SDES BLOOD FOOT LEFT 05/04/2011 0947   SPECREQUEST BOTTLES DRAWN AEROBIC ONLY 8CC 05/04/2011 0947   CULT NO GROWTH 5 DAYS 05/04/2011 0947   REPTSTATUS 05/10/2011 FINAL 05/04/2011 0947  CBG:  Lab 05/09/11 0647 05/08/11 1657 05/08/11 1232 05/07/11 2148 05/07/11 1633  GLUCAP 152* 181* 154* 129* 159*  Medications:      . allopurinol  150 mg Oral Daily  . aspirin EC  81 mg Oral Daily  . calcium carbonate  200 mg of elemental calcium Oral TID WC  .  ceFAZolin (ANCEF) IV  2 g Intravenous Q T,Th,Sat-1800  .  ezetimibe-simvastatin  1 tablet Oral QHS  . fentaNYL  250 mcg Intravenous Once  . metoprolol tartrate  37.5 mg Oral BID  . midazolam  10 mg Intravenous Once  . multivitamin  1 tablet Oral QHS  . saccharomyces boulardii  250 mg Oral BID  . sodium chloride  3 mL Intravenous Q12H  . sodium chloride  3 mL Intravenous Q12H  . temazepam  15 mg Oral QHS  . warfarin  2.5 mg Oral Custom  . warfarin  5 mg Oral Custom  . Warfarin - Pharmacist Dosing Inpatient   Does not apply q1800    I  have reviewed scheduled and prn medications.  Sheffield Slider, PA-C [facility] Beeper [phone]  05/10/2011,8:31 AM  LOS: 7 days

## 2011-05-10 NOTE — Progress Notes (Signed)
I have seen and examined this patient and agree with the assessment/plan as outlined above by [facility] PA. TEE negative for evidence of SBE- plan for DC possibly today with 6 more weeks of antibiotics (total 8) and [facility] f/u. Ayala Ribble K.,MD 05/10/2011 9:16 AM

## 2011-05-10 NOTE — Progress Notes (Signed)
Patient ID: Alan [name], male   DOB: Dec 10, 1931, 78 y.o.   MRN: [id number]  Assessment/Plan:   Principal Problem:   *Sepsis  - this is secondary to MSSA bacteremia  - pt clinically improving  - appreciate ID input, recommendation is to continue Ancef IV for total of 6 weeks therapy (day 7/42) - please note that TEE is negative for endocarditis, repeat BCx (04/26 with NGTD)   Active Problems:   Atrial fibrillation  - this has been rate controlled and stable  - continue Coumadin per pharmacy   Aortic stenosis  - per cardiology recommendations   End stage renal disease on dialysis  - on HD TTS, this will continue as scheduled while pt is in the hospital   Toxic metabolic encephalopathy  - likely secondary to MSSA bacteremia  - pt clinically improving and this is now resolved   Diabetes mellitus  - continue to monitor CBG  - will continue to monitor as per floor protocol   Chronic anticoagulation  - continue Coumadin per pharmacy   Anemia of chronic disease  - Hg/Hct remain stable and at pt's baseline  - CBC in AM   Diarrhea  - now improved  - C.Diff by PCR negative   EDUCATION  - test results and diagnostic studies were discussed with patient  - patient verbalized the understanding  - questions were answered at the bedside and contact information was provided for additional questions or concerns   Disposition - per PT, patient does not feel well for discharge to day as he felt exhausted after HD - anticipate another PT eval tomorrow and we will proceed with appropriate disposition plan  Consults to date: 1. Infectious disease 2. Nephrology 3. Cardiology - for TEE 4. Physical therapy  Subjective: No events overnight. Patient denies chest pain, shortness of breath, abdominal pain.   Objective:  Vital signs in last 24 hours:  Filed Vitals:   05/10/11 1130 05/10/11 1208 05/10/11 1211 05/10/11 1247  BP: 122/66 115/66  124/62  Pulse: 81 81  107  Temp:     98.9 F (37.2 C)  TempSrc:    Oral  Resp:  17 18 17   Height:      Weight:   110.2 kg (242 lb 15.2 oz)   SpO2:   99% 95%    Intake/Output from previous day:  Intake/Output Summary (Last 24 hours) at 05/10/11 1451 Last data filed at 05/10/11 1211  Gross per 24 hour  Intake    240 ml  Output   3000 ml  Net  -2760 ml    Physical Exam: General: Alert, awake, oriented x3, in no acute distress. HEENT: No bruits, no goiter. Moist mucous membranes, no scleral icterus, no conjunctival pallor. Heart: Regular rate and rhythm, S1/S2 +, no murmurs, rubs, gallops. Lungs: Clear to auscultation bilaterally. No wheezing, no rhonchi, no rales.  Abdomen: Soft, nontender, nondistended, positive bowel sounds. Extremities: No clubbing or cyanosis, no pitting edema,  positive pedal pulses. Neuro: Grossly nonfocal.  Lab Results:  Lab 05/10/11 0940 05/08/11 0723 05/05/11 0828 05/04/11 0355  WBC 7.8 8.2 10.1 12.2*  HGB 11.3* 11.4* 12.7* 12.6*  HCT 34.5* 34.2* 38.6* 38.0*  PLT 213 147* 122* 123*  MCV 91.3 90.5 92.3 91.6    Lab 05/10/11 0805 05/08/11 0724 05/05/11 0828 05/04/11 0355  NA 133* 133* 136 138  K 4.1 3.7 3.9 3.7  CL 97 96 97 99  CO2 24 25 27 30   GLUCOSE 171* 141* 96 63*  BUN 34* 36* 33* 20  CREATININE 7.02* 7.58* 6.64* 5.16*  CALCIUM 8.7 8.8 8.8 8.5    Lab 05/10/11 0805 05/09/11 0515 05/08/11 1400 05/07/11 0357 05/06/11 0410  INR 2.41* 2.57* 2.34* 2.39* 2.54*    CULTURE, BLOOD (ROUTINE X 2)     Status: Normal   Collection Time   05/03/11  3:30 AM      Component Value Range Status Comment   Culture  Setup Time [id number]   Final    Culture     Final    Value: STAPHYLOCOCCUS AUREUS     Note: SUSCEPTIBILITIES PERFORMED ON PREVIOUS CULTURE WITHIN THE LAST 5 DAYS.     Note: Gram Stain Report Called to,Read Back By and Verified With: HILDA MOOSE @ 1957 ON 05/03/2011 HAJAM   Report Status 05/06/2011 FINAL   Final   CULTURE, BLOOD (ROUTINE X 2)     Status: Normal    Collection Time   05/03/11  3:45 AM      Component Value Range Status Comment   Culture  Setup Time [id number]   Final    Culture     Final    Value: STAPHYLOCOCCUS AUREUS     Note: RIFAMPIN AND GENTAMICIN SHOULD NOT BE USED AS SINGLE DRUGS FOR TREATMENT OF STAPH INFECTIONS. This organism DOES NOT demonstrate inducible Clindamycin resistance in vitro.     Note: Gram Stain Report Called to,Read Back By and Verified With: HILDA MOOSE @ 1957 ON 05/03/2011 HAJAM   Report Status 05/06/2011 FINAL   Final    Organism ID, Bacteria STAPHYLOCOCCUS AUREUS   Final   MRSA PCR SCREENING     Status: Normal   Collection Time   05/03/11  5:24 PM      Component Value Range Status Comment   MRSA by PCR NEGATIVE  NEGATIVE  Final   STOOL CULTURE     Status: Normal   Collection Time   05/04/11  4:19 AM      Component Value Range Status Comment   Specimen Description STOOL   Final    Special Requests NONE   Final    Culture     Final    Value: NO SALMONELLA, SHIGELLA, CAMPYLOBACTER, OR YERSINIA ISOLATED   Report Status 05/07/2011 FINAL   Final   CULTURE, BLOOD (ROUTINE X 2)     Status: Normal   Collection Time   05/04/11  9:36 AM      Component Value Range Status Comment   Culture  Setup Time [id number]   Final    Culture NO GROWTH 5 DAYS   Final    Report Status 05/10/2011 FINAL   Final   CULTURE, BLOOD (ROUTINE X 2)     Status: Normal   Collection Time   05/04/11  9:47 AM      Component Value Range Status Comment   Culture  Setup Time [id number]   Final    Culture NO GROWTH 5 DAYS   Final    Report Status 05/10/2011 FINAL   Final   CLOSTRIDIUM DIFFICILE BY PCR     Status: Normal   Collection Time   05/04/11  5:00 PM      Component Value Range Status Comment   C difficile by pcr NEGATIVE  NEGATIVE  Final     Medications: Scheduled Meds:   . allopurinol  150 mg Oral Daily  . aspirin EC  81 mg Oral Daily  . calcium carbonate  200 mg of elemental calcium Oral TID WC  .  ceFAZolin (ANCEF) IV   2 g Intravenous Q T,Th,Sat-1800  . ezetimibe-simvastatin  1 tablet Oral QHS  . fentaNYL  250 mcg Intravenous Once  . metoprolol tartrate  37.5 mg Oral BID  . midazolam  10 mg Intravenous Once  . multivitamin  1 tablet Oral QHS  . saccharomyces boulardii  250 mg Oral BID  . sodium chloride  3 mL Intravenous Q12H  . sodium chloride  3 mL Intravenous Q12H  . temazepam  15 mg Oral QHS  . warfarin  2.5 mg Oral Custom  . warfarin  5 mg Oral Custom  . Warfarin - Pharmacist Dosing Inpatient   Does not apply q1800   Continuous Infusions:  PRN Meds:.sodium chloride, sodium chloride, acetaminophen, acetaminophen, benzocaine, bismuth subsalicylate, butamben-tetracaine-benzocaine, camphor-menthol, dextrose, docusate sodium, feeding supplement (NEPRO CARB STEADY), fentaNYL, heparin, hydrOXYzine, ibuprofen, midazolam, ondansetron (ZOFRAN) IV, ondansetron, oxyCODONE, sodium chloride, sodium chloride, sorbitol, traMADol    LOS: 7 days   Iyahna Obriant 05/10/2011, 2:51 PM  [facility] Pager: [phone]

## 2011-05-10 NOTE — Evaluation (Signed)
Physical Therapy Evaluation Patient Details Name: Alan [name] MRN: [id number] DOB: 1931-02-26 Today's Date: 05/10/2011 Time: 4098-1191 PT Time Calculation (min): 34 min  PT Assessment / Plan / Recommendation Clinical Impression  Pt. admitted with sepsis, toxic metabolic encephalopathy, early osteoand discitis lumbar spine.  Pt. is immediately post HD but was able to stand without aid of assistive device.  Hopeful that tomorrow he will be able to ambulate with RW.  Will recommend DC home with HHPT as long as he makes progress as anticipated.  If he is not able to show adequate progress , will need to look at [facility] for rehab.    PT Assessment  Patient needs continued PT services    Follow Up Recommendations  Home health PT;Supervision/Assistance - 24 hour    Equipment Recommendations  None recommended by PT    Frequency Min 3X/week    Precautions / Restrictions Precautions Precautions: Fall Restrictions Weight Bearing Restrictions: No   Pertinent Vitals/Pain Pt. Reports pain in back X1 year.       Mobility  Bed Mobility Bed Mobility: Supine to Sit;Sit to Supine Supine to Sit: 4: Min guard;With rails;HOB elevated Sit to Supine: 4: Min guard;With rail;HOB elevated Details for Bed Mobility Assistance: extra time needed Transfers Transfers: Sit to Stand;Stand to Sit Sit to Stand: 4: Min guard;With upper extremity assist;From bed Stand to Sit: 4: Min guard;To bed;With upper extremity assist Details for Transfer Assistance: cues to slow down and for safety aspects of mobility Ambulation/Gait Ambulation/Gait Assistance: Not tested (comment);Other (comment) (pt. declined due to just out of HD)    Exercises     PT Goals Acute Rehab PT Goals PT Goal Formulation: With patient Time For Goal Achievement: 05/24/11 Potential to Achieve Goals: Good Pt will Roll Supine to Right Side: with modified independence PT Goal: Rolling Supine to Right Side - Progress: Goal set  today Pt will go Supine/Side to Sit: with modified independence PT Goal: Supine/Side to Sit - Progress: Goal set today Pt will go Sit to Supine/Side: with modified independence PT Goal: Sit to Supine/Side - Progress: Goal set today Pt will go Sit to Stand: with modified independence PT Goal: Sit to Stand - Progress: Goal set today Pt will go Stand to Sit: with modified independence PT Goal: Stand to Sit - Progress: Goal set today Pt will Transfer Bed to Chair/Chair to Bed: with modified independence PT Transfer Goal: Bed to Chair/Chair to Bed - Progress: Goal set today Pt will Ambulate: 51 - 150 feet;with supervision;with rolling walker PT Goal: Ambulate - Progress: Goal set today Pt will Go Up / Down Stairs: 1-2 stairs;with min assist;with least restrictive assistive device PT Goal: Up/Down Stairs - Progress: Goal set today Pt will Perform Home Exercise Program: Independently PT Goal: Perform Home Exercise Program - Progress: Goal set today  Visit Information  Last PT Received On: 05/10/11 Assistance Needed: +2 (safety)    Subjective Data  Subjective: I just got back from dialysis and ate lunch Patient Stated Goal: return to walking with walker and/or cane   Prior Functioning  Home Living Lives With: Spouse Available Help at Discharge: Family Type of Home: House Home Access: Stairs to enter Secretary/administrator of Steps: 1 Entrance Stairs-Rails: None Home Layout: One level Bathroom Shower/Tub: Health visitor: Standard Home Adaptive Equipment: Environmental consultant - rolling;Straight cane Prior Function Level of Independence: Independent Able to Take Stairs?: Yes Driving: No Vocation: Retired Musician: No difficulties    Cognition  Overall Cognitive Status: Appears  within functional limits for tasks assessed/performed Arousal/Alertness: Awake/alert Orientation Level: Appears intact for tasks assessed Behavior During Session: [facility] for tasks  performed    Extremity/Trunk Assessment Right Upper Extremity Assessment RUE ROM/Strength/Tone: [facility] for tasks assessed RUE Sensation: WFL - Light Touch RUE Coordination: WFL - gross motor Left Upper Extremity Assessment LUE ROM/Strength/Tone: WFL for tasks assessed LUE Sensation: WFL - Light Touch LUE Coordination: WFL - gross motor Right Lower Extremity Assessment RLE ROM/Strength/Tone: Deficits RLE ROM/Strength/Tone Deficits: strength grossly 3+/5 RLE Sensation: History of peripheral neuropathy RLE Coordination: WFL - gross motor Left Lower Extremity Assessment LLE ROM/Strength/Tone: Deficits LLE ROM/Strength/Tone Deficits: strength grossly 3+/5 LLE Sensation: History of peripheral neuropathy LLE Coordination: WFL - gross motor Trunk Assessment Trunk Assessment: Kyphotic   Balance Balance Balance Assessed: Yes Static Sitting Balance Static Sitting - Balance Support: No upper extremity supported;Feet supported Static Sitting - Level of Assistance: 7: Independent Static Sitting - Comment/# of Minutes: 5 Dynamic Sitting Balance Dynamic Sitting - Balance Support: No upper extremity supported;Feet supported Dynamic Sitting - Level of Assistance: 5: Stand by assistance  End of Session PT - End of Session Activity Tolerance: Patient tolerated treatment well;Patient limited by fatigue Patient left: in bed;with call bell/phone within reach Nurse Communication: Mobility status   Ferman Hamming 05/10/2011, 2:49 PM Acute Rehabilitation Services [phone] (pager)

## 2011-05-10 NOTE — Progress Notes (Signed)
ANTICOAGULATION & ANTIBIOTIC CONSULT NOTE - Follow Up Consult  Pharmacy Consult for Coumadin, Ancef Indication: Chronic Afib; MSSA bacteremia  No Known Allergies  Patient Measurements: Height: 6\' 1"  (185.4 cm) Weight: 242 lb 15.2 oz (110.2 kg) IBW/kg (Calculated) : 79.9   Vital Signs: Temp: 98.9 F (37.2 C) (05/02 1247) Temp src: Oral (05/02 1247) BP: 124/62 mmHg (05/02 1247) Pulse Rate: 107  (05/02 1247)  Labs:  Alvira Philips 05/10/11 0940 05/10/11 0805 05/09/11 0515 05/08/11 1400 05/08/11 0724 05/08/11 0723  HGB 11.3* -- -- -- -- 11.4*  HCT 34.5* -- -- -- -- 34.2*  PLT 213 -- -- -- -- 147*  APTT -- -- -- -- -- --  LABPROT -- 26.6* 28.0* 26.0* -- --  INR -- 2.41* 2.57* 2.34* -- --  HEPARINUNFRC -- -- -- -- -- --  CREATININE -- 7.02* -- -- 7.58* --  CKTOTAL -- -- -- -- -- --  CKMB -- -- -- -- -- --  TROPONINI -- -- -- -- -- --   Estimated Creatinine Clearance: 11.5 ml/min (by C-G formula based on Cr of 7.02).   Medications:  Anti-infectives     Start     Dose/Rate Route Frequency Ordered Stop   05/08/11 2200   ceFAZolin (ANCEF) IVPB 2 g/50 mL premix        2 g 100 mL/hr over 30 Minutes Intravenous Every T-Th-Sa (1800) 05/07/11 1249     05/05/11 1500   levofloxacin (LEVAQUIN) IVPB 500 mg  Status:  Discontinued        500 mg 100 mL/hr over 60 Minutes Intravenous Every 48 hours 05/03/11 1424 05/04/11 0843   05/05/11 1200   vancomycin (VANCOCIN) IVPB 1000 mg/200 mL premix  Status:  Discontinued        1,000 mg 200 mL/hr over 60 Minutes Intravenous Every T-Th-Sa (Hemodialysis) 05/03/11 1645 05/07/11 1203   05/03/11 1430   levofloxacin (LEVAQUIN) IVPB 750 mg        750 mg 100 mL/hr over 90 Minutes Intravenous To [facility] 05/03/11 1424 05/03/11 2330   05/03/11 1400   piperacillin-tazobactam (ZOSYN) IVPB 2.25 g  Status:  Discontinued        2.25 g 100 mL/hr over 30 Minutes Intravenous Every 8 hours 05/03/11 1325 05/04/11 0843   05/03/11 1330   vancomycin  (VANCOCIN) IVPB 1000 mg/200 mL premix        1,000 mg 200 mL/hr over 60 Minutes Intravenous To [facility] 05/03/11 1325 05/04/11 1330   05/03/11 0330   piperacillin-tazobactam (ZOSYN) IVPB 3.375 g        3.375 g 12.5 mL/hr over 240 Minutes Intravenous  Once 05/03/11 0323 05/03/11 0802   05/03/11 0330   vancomycin (VANCOCIN) IVPB 1000 mg/200 mL premix        1,000 mg 200 mL/hr over 60 Minutes Intravenous  Once 05/03/11 0323 05/03/11 0609          Assessment: 78 y.o. M on warfarin for hx Afib with a therapeutic INR this a.m on home regimen (INR 2.41 << 2.57 << 2.34, goal of 2-3). Hgb/Hct stable, plt increased. No s/sx of bleeding noted.   This patient is also on Cefazolin for MSSA bacteremia -- per ID to get ~6 weeks (total abx day 8) due to early diskitis/osteo shown on MRI. Dose remains appropriate at this time. HD TTS.   Goal of Therapy:  INR 2-3   Plan:  1. Continue warfarin 2.5 mg daily EXCEPT for 5 mg on TThSu 2.  Continue Ancef 2g IV post HD on HD days 3. Will continue to monitor for any signs/symptoms of bleeding and will follow up with PT/INR in the a.m.   Maudry Mayhew, PharmD Pgr [phone] 05/10/2011 3:08 PM

## 2011-05-10 NOTE — Telephone Encounter (Signed)
New msg Pt's wife called and said he was in hospital since [phone] and she cancelled appt for echo. She wanted Dr [place] to know

## 2011-05-11 DIAGNOSIS — E119 Type 2 diabetes mellitus without complications: Secondary | ICD-10-CM

## 2011-05-11 DIAGNOSIS — A419 Sepsis, unspecified organism: Secondary | ICD-10-CM

## 2011-05-11 DIAGNOSIS — N186 End stage renal disease: Secondary | ICD-10-CM

## 2011-05-11 LAB — GLUCOSE, CAPILLARY: Glucose-Capillary: 221 mg/dL — ABNORMAL HIGH (ref 70–99)

## 2011-05-11 LAB — CLOSTRIDIUM DIFFICILE CULTURE-FECAL

## 2011-05-11 MED ORDER — SODIUM CHLORIDE 0.9 % IV BOLUS (SEPSIS)
250.0000 mL | Freq: Once | INTRAVENOUS | Status: AC
  Administered 2011-05-11: 250 mL via INTRAVENOUS

## 2011-05-11 NOTE — Progress Notes (Signed)
I have seen and examined this patient and agree with the assessment/plan as outlined above by Lyda Perone NP. Agree with decreased anti- hypertensive therapy- HD tomorrow with careful UF. Mukund Weinreb K.,MD 05/11/2011 2:02 PM

## 2011-05-11 NOTE — Progress Notes (Signed)
Physical Therapy Treatment Patient Details Name: Alan [name] MRN: [id number] DOB: 1931/07/07 Today's Date: 05/11/2011 Time: 9147-8295 PT Time Calculation (min): 14 min  PT Assessment / Plan / Recommendation Comments on Treatment Session  Pt. showed significant progress this am and appears safe to DC home with wife to provide transportation to HD and for 24 hour supervision.  He strongly feels he is at his mobility baseline this am.  Would like HHPT to come in to do home safety eval and make any pertinent home recommendations.    Follow Up Recommendations  Home health PT;Supervision/Assistance - 24 hour    Equipment Recommendations  None recommended by PT    Frequency Min 3X/week   Plan Discharge plan remains appropriate    Precautions / Restrictions Precautions Precautions: Fall Restrictions Weight Bearing Restrictions: No   Pertinent Vitals/Pain No mobility limitations due to pain isssues today    Mobility  Bed Mobility Bed Mobility: Supine to Sit Supine to Sit: 6: Modified independent (Device/Increase time);With rails;HOB elevated Details for Bed Mobility Assistance: safe technique demonstrated bu pt. Transfers Transfers: Sit to Stand;Stand to Sit Sit to Stand: 5: Supervision;With upper extremity assist;From bed Stand to Sit: 5: Supervision;With upper extremity assist;With armrests;To chair/3-in-1 Details for Transfer Assistance: pt. mobilizing well with RW today and needed only occasional safety cue and postural cue for erect stance. Ambulation/Gait Ambulation/Gait Assistance: 6: Modified independent (Device/Increase time);Other (comment) (second person available for safety for first walk with PT) Ambulation Distance (Feet): 180 Feet Assistive device: Rolling walker Ambulation/Gait Assistance Details: Pt. maintains forward trunk inclination to reduce back pain while up and likely out of habit.  Encouraged upright posture to the best of his ability.  Pt. denies pain  while up walking thsi morning. Gait Pattern: Step-through pattern;Trunk flexed    Exercises     PT Goals Acute Rehab PT Goals PT Goal: Supine/Side to Sit - Progress: Met PT Goal: Sit to Stand - Progress: Progressing toward goal PT Goal: Stand to Sit - Progress: Progressing toward goal PT Goal: Ambulate - Progress: Met  Visit Information  Last PT Received On: 05/11/11 Assistance Needed: +2 (for safety)    Subjective Data  Subjective: "I am AT my baseline with walking"   Cognition  Overall Cognitive Status: Appears within functional limits for tasks assessed/performed Arousal/Alertness: Awake/alert Orientation Level: Appears intact for tasks assessed Behavior During Session: [facility] for tasks performed    Balance     End of Session PT - End of Session Equipment Utilized During Treatment: Gait belt Activity Tolerance: Patient tolerated treatment well;Other (comment) (much improved effort compared to yesterday post HD) Patient left: in chair;with call bell/phone within reach Nurse Communication: Mobility status    Ferman Hamming 05/11/2011, 11:06 AM Acute Rehabilitation Services [phone] (pager)

## 2011-05-11 NOTE — Telephone Encounter (Signed)
Dr.Jordan was told patient in hospital.

## 2011-05-11 NOTE — Progress Notes (Signed)
ANTICOAGULATION & ANTIBIOTIC CONSULT NOTE - Follow Up Consult  Pharmacy Consult for Coumadin, Ancef Indication: Chronic Afib; MSSA bacteremia  No Known Allergies  Patient Measurements: Height: 6\' 1"  (185.4 cm) Weight: 249 lb 12.5 oz (113.3 kg) (bed scale) IBW/kg (Calculated) : 79.9   Vital Signs: Temp: 98.2 F (36.8 C) (05/03 1317) Temp src: Oral (05/03 1317) BP: 93/41 mmHg (05/03 1317) Pulse Rate: 81  (05/03 1317)  Labs:  Basename 05/11/11 0857 05/10/11 0940 05/10/11 0805 05/09/11 0515  HGB -- 11.3* -- --  HCT -- 34.5* -- --  PLT -- 213 -- --  APTT -- -- -- --  LABPROT 23.4* -- 26.6* 28.0*  INR 2.04* -- 2.41* 2.57*  HEPARINUNFRC -- -- -- --  CREATININE -- -- 7.02* --  CKTOTAL -- -- -- --  CKMB -- -- -- --  TROPONINI -- -- -- --   Estimated Creatinine Clearance: 11.6 ml/min (by C-G formula based on Cr of 7.02).   Medications:  Anti-infectives     Start     Dose/Rate Route Frequency Ordered Stop   05/08/11 2200   ceFAZolin (ANCEF) IVPB 2 g/50 mL premix        2 g 100 mL/hr over 30 Minutes Intravenous Every T-Th-Sa (1800) 05/07/11 1249     05/05/11 1500   levofloxacin (LEVAQUIN) IVPB 500 mg  Status:  Discontinued        500 mg 100 mL/hr over 60 Minutes Intravenous Every 48 hours 05/03/11 1424 05/04/11 0843   05/05/11 1200   vancomycin (VANCOCIN) IVPB 1000 mg/200 mL premix  Status:  Discontinued        1,000 mg 200 mL/hr over 60 Minutes Intravenous Every T-Th-Sa (Hemodialysis) 05/03/11 1645 05/07/11 1203   05/03/11 1430   levofloxacin (LEVAQUIN) IVPB 750 mg        750 mg 100 mL/hr over 90 Minutes Intra[facility]ous To [facility] 05/03/11 1424 05/03/11 2330   05/03/11 1400   piperacillin-tazobactam (ZOSYN) IVPB 2.25 g  Status:  Discontinued        2.25 g 100 mL/hr over 30 Minutes Intravenous Every 8 hours 05/03/11 1325 05/04/11 0843   05/03/11 1330   vancomycin (VANCOCIN) IVPB 1000 mg/200 mL premix        1,000 mg 200 mL/hr over 60 Minutes Intravenous To  [facility] 05/03/11 1325 05/04/11 1330   05/03/11 0330   piperacillin-tazobactam (ZOSYN) IVPB 3.375 g        3.375 g 12.5 mL/hr over 240 Minutes Intravenous  Once 05/03/11 0323 05/03/11 0802   05/03/11 0330   vancomycin (VANCOCIN) IVPB 1000 mg/200 mL premix        1,000 mg 200 mL/hr over 60 Minutes Intravenous  Once 05/03/11 0323 05/03/11 0609          Assessment: 78 y.o. M on warfarin for hx Afib with a therapeutic INR this a.m on home regimen (INR 2.04 << 2.41 << 2.57 << 2.34, goal of 2-3). Hgb/Hct has been stable, plt increased; no CBC today. No s/sx of bleeding noted.   This patient is also on Cefazolin for MSSA bacteremia -- per ID to get ~6 weeks (total abx day 9) due to early diskitis/osteo shown on MRI. Dose remains appropriate at this time. HD TTS. Plan for HD tomorrow.   Goal of Therapy:  INR 2-3   Plan:  1. Continue warfarin 2.5 mg daily EXCEPT for 5 mg on TThSu 2. Continue Ancef 2g IV post HD on HD days 3. Will continue to  monitor for any signs/symptoms of bleeding and will follow up with PT/INR in the a.m.   Maudry Mayhew, PharmD Pgr [phone] 05/11/2011 2:09 PM

## 2011-05-11 NOTE — Progress Notes (Addendum)
Subjective: Patient seen and examined, denies any complaints. Objective: Vital signs in last 24 hours: Temp:  [98 F (36.7 C)-98.9 F (37.2 C)] 98.6 F (37 C) (05/03 0529) Pulse Rate:  [80-107] 80  (05/03 0529) Resp:  [17-18] 18  (05/03 0529) BP: (93-124)/(44-66) 95/53 mmHg (05/03 0529) SpO2:  [95 %-100 %] 97 % (05/03 0529) Weight:  [110.2 kg (242 lb 15.2 oz)-113.3 kg (249 lb 12.5 oz)] 113.3 kg (249 lb 12.5 oz) (05/02 2100) Weight change: -1.1 kg (-2 lb 6.8 oz) Last BM Date: 05/10/11  Intake/Output from previous day: 05/02 0701 - 05/03 0700 In: 120 [P.O.:120] Out: 3000      Physical Exam: General: Alert, awake, oriented x3, in no acute distress. HEENT: No bruits, no goiter. Heart: Irregular irregular rhythm, without murmurs, rubs, gallops. Lungs: Clear to auscultation bilaterally. Abdomen: Soft, nontender, nondistended, positive bowel sounds. Extremities: No clubbing cyanosis or edema with positive pedal pulses. Neuro: Grossly intact, nonfocal.    Lab Results: Results for orders placed during the hospital encounter of 05/03/11 (from the past 24 hour(s))  CBC     Status: Abnormal   Collection Time   05/10/11  9:40 AM      Component Value Range   WBC 7.8  4.0 - 10.5 (K/uL)   RBC 3.78 (*) 4.22 - 5.81 (MIL/uL)   Hemoglobin 11.3 (*) 13.0 - 17.0 (g/dL)   HCT 96.0 (*) 45.4 - 52.0 (%)   MCV 91.3  78.0 - 100.0 (fL)   MCH 29.9  26.0 - 34.0 (pg)   MCHC 32.8  30.0 - 36.0 (g/dL)   RDW 09.8  11.9 - 14.7 (%)   Platelets 213  150 - 400 (K/uL)  GLUCOSE, CAPILLARY     Status: Abnormal   Collection Time   05/10/11 12:54 PM      Component Value Range   Glucose-Capillary 157 (*) 70 - 99 (mg/dL)  GLUCOSE, CAPILLARY     Status: Abnormal   Collection Time   05/11/11  7:45 AM      Component Value Range   Glucose-Capillary 221 (*) 70 - 99 (mg/dL)    Studies/Results: No results found.  Medications:    . allopurinol  150 mg Oral Daily  . aspirin EC  81 mg Oral Daily  . calcium  carbonate  200 mg of elemental calcium Oral TID WC  .  ceFAZolin (ANCEF) IV  2 g Intravenous Q T,Th,Sat-1800  . ezetimibe-simvastatin  1 tablet Oral QHS  . fentaNYL  250 mcg Intravenous Once  . hydrocortisone  25 mg Rectal BID  . metoprolol tartrate  37.5 mg Oral BID  . midazolam  10 mg Intravenous Once  . multivitamin  1 tablet Oral QHS  . saccharomyces boulardii  250 mg Oral BID  . sodium chloride  3 mL Intravenous Q12H  . sodium chloride  3 mL Intravenous Q12H  . temazepam  15 mg Oral QHS  . warfarin  2.5 mg Oral Custom  . warfarin  5 mg Oral Custom  . Warfarin - Pharmacist Dosing Inpatient   Does not apply q1800  . DISCONTD: hydrocortisone  25 mg Rectal BID    sodium chloride, sodium chloride, acetaminophen, acetaminophen, benzocaine, bismuth subsalicylate, butamben-tetracaine-benzocaine, camphor-menthol, dextrose, docusate sodium, feeding supplement (NEPRO CARB STEADY), fentaNYL, heparin, hydrOXYzine, ibuprofen, midazolam, ondansetron (ZOFRAN) IV, ondansetron, oxyCODONE, sodium chloride, sodium chloride, sorbitol, traMADol     Assessment/Plan:  Principal Problem:  *Sepsis  - secondary to MSSA bacteremia  - appreciate ID input, recommendation is to continue  Ancef IV for total of 6 weeks therapy (day 7/42)  -  TEE is negative for endocarditis, repeat BCx (04/26 with NGTD)  Hypotension: -Patient is hypotensive, would hold metoprolol and bolus with 250 cc of normal saline. Would consider midodrine if blood pressure does not improve. Active Problems:  Atrial fibrillation  -  rate controlled . Hold metoprolol secondary to hypotension. - continue Coumadin per pharmacy . INR 2.4 Aortic stenosis ,sever - per cardiology recommendations s/p TEE on 5/1 ,plans to get transthoracic views to assess the severity of AS.will order 2 D echo . End stage renal disease on dialysis  - on HD TTS,  was last dialyzed yesterday  Toxic metabolic encephalopathy  -Resolved - likely secondary to MSSA  bacteremia  Diabetes mellitus  - continue to monitor CBG  Anemia of chronic disease  - Hg/Hct remain stable and at pt's baseline  Diarrhea  - now improved  - C.Diff by PCR negative  Disposition  Patient is not medically stable for discharge today, plan to discharge back to home with home health services when stable.     LOS: 8 days   Alan [name] 05/11/2011, 9:01 AM

## 2011-05-11 NOTE — Progress Notes (Addendum)
Subjective:  Sitting up side of bed; generalized weakness otherwise asymptomatic to low BP's; Tolerating PT services and ready to go home (am after HD); wife @ bedside   Vital signs in last 24 hours: Filed Vitals:   05/11/11 0919 05/11/11 0922 05/11/11 1021 05/11/11 1317  BP: 77/35 80/36 79/47    Pulse: 81   81  Temp: 97.7 F (36.5 C)   98.2 F (36.8 C)  TempSrc: Oral   Oral  Resp: 17   17  Height:      Weight:      SpO2: 98%   100%   Weight change: -1.1 kg (-2 lb 6.8 oz)  Intake/Output Summary (Last 24 hours) at 05/11/11 1318 Last data filed at 05/11/11 1318  Gross per 24 hour  Intake    720 ml  Output      0 ml  Net    720 ml   Labs: Basic Metabolic Panel:  Lab 05/10/11 8295 05/08/11 0724 05/05/11 0828  NA 133* 133* 136  K 4.1 3.7 3.9  CL 97 96 97  CO2 24 25 27   GLUCOSE 171* 141* 96  BUN 34* 36* 33*  CREATININE 7.02* 7.58* 6.64*  CALCIUM 8.7 8.8 8.8  ALB -- -- --  PHOS 3.2 2.3 --   Liver Function Tests:  Lab 05/10/11 0805 05/08/11 0724  AST -- --  ALT -- --  ALKPHOS -- --  BILITOT -- --  PROT -- --  ALBUMIN 2.6* 2.6*   No results found for this basename: LIPASE:3,AMYLASE:3 in the last 168 hours No results found for this basename: AMMONIA:3 in the last 168 hours CBC:  Lab 05/10/11 0940 05/08/11 0723 05/05/11 0828  WBC 7.8 8.2 10.1  NEUTROABS -- -- --  HGB 11.3* 11.4* 12.7*  HCT 34.5* 34.2* 38.6*  MCV 91.3 90.5 92.3  PLT 213 147* 122*   Cardiac Enzymes: No results found for this basename: CKTOTAL:5,CKMB:5,CKMBINDEX:5,TROPONINI:5 in the last 168 hours CBG:  Lab 05/11/11 0745 05/10/11 1254 05/09/11 0647 05/08/11 1657 05/08/11 1232  GLUCAP 221* 157* 152* 181* 154*    Iron Studies: No results found for this basename: IRON,TIBC,TRANSFERRIN,FERRITIN in the last 72 hours Studies/Results: No results found. Medications:      . allopurinol  150 mg Oral Daily  . aspirin EC  81 mg Oral Daily  . calcium carbonate  200 mg of elemental calcium Oral TID  WC  .  ceFAZolin (ANCEF) IV  2 g Intravenous Q T,Th,Sat-1800  . ezetimibe-simvastatin  1 tablet Oral QHS  . fentaNYL  250 mcg Intravenous Once  . hydrocortisone  25 mg Rectal BID  . midazolam  10 mg Intravenous Once  . multivitamin  1 tablet Oral QHS  . saccharomyces boulardii  250 mg Oral BID  . sodium chloride  250 mL Intravenous Once  . sodium chloride  3 mL Intravenous Q12H  . sodium chloride  3 mL Intravenous Q12H  . temazepam  15 mg Oral QHS  . warfarin  2.5 mg Oral Custom  . warfarin  5 mg Oral Custom  . Warfarin - Pharmacist Dosing Inpatient   Does not apply q1800  . DISCONTD: hydrocortisone  25 mg Rectal BID  . DISCONTD: metoprolol tartrate  37.5 mg Oral BID    I  have reviewed scheduled and prn medications.  Physical Exam: General: comfortable Heart: normal S1, S2; chronic afib w/controlled rate Lungs: diminished bases; no adventitious BS Abdomen: obese, soft, NT, +BS Extremities: no significant LE  edema Dialysis Access: RUA  AVF, +T/B  Problem/Plan: 1. MSSA bacteremia/early diskitis/osteomyelitis- per MRI (most pronounced at L1-L2); neg TEE;  On Ancef x 6 weeks per ID (first dose Tu 05/08/11); to be given outpt HD center; last dose Thu 06/21/11 2. ESRD -TTS SWAF- HD first run in am anticipating discharge afterwards 3. Anemia - Hgb 11.3; given 6.25 Aranesp x 1; hold ESA and iron for now; follow trend  4. Secondary hyperparathyroidism - phos ^3.2, ca 8.7; still on Tums with meals; follow closely  5. HTN/volume/CAD/AS - Hypotensive and Discharge canceled; currently above outpt eDW (111kg); re-evaluate after HD in am (plan to keep even for now); noted plans for additional transthoracic views of the aortic valve to see if AS severe and needs f/u.  6. Nutrition - albumin 2.6; factor in volume removal with HD; cont ^ high protein renal diet and suppl  7. Atrial fibrillation - Rate controlled on BB; on Coumadin per pharmacy; INR therapeutic 2.04  8. Diarrhea- improved on  probiotics, (C.difficile PCR negative) 9. DM- CBG and SSI per primary 9. Disposition/Deconditioned - PT/OT while inpt and plans for discharge home with wife vs anticipated SNF; suspect after HD in am    [name], FNP-C [facility] Pager [phone]  05/11/2011,1:18 PM  LOS: 8 days    Update records regarding antibiotic stop date

## 2011-05-11 NOTE — Progress Notes (Signed)
Pt's BP manually was 80/36.MD has been notified.Keep monitoring pt. closely

## 2011-05-12 ENCOUNTER — Inpatient Hospital Stay (HOSPITAL_COMMUNITY): Payer: MEDICARE

## 2011-05-12 DIAGNOSIS — A419 Sepsis, unspecified organism: Secondary | ICD-10-CM

## 2011-05-12 DIAGNOSIS — E119 Type 2 diabetes mellitus without complications: Secondary | ICD-10-CM

## 2011-05-12 DIAGNOSIS — N186 End stage renal disease: Secondary | ICD-10-CM

## 2011-05-12 MED ORDER — VALACYCLOVIR HCL 500 MG PO TABS: 500.0000 mg | ORAL_TABLET | Freq: Every day | ORAL | Status: DC

## 2011-05-12 MED ORDER — SACCHAROMYCES BOULARDII 250 MG PO CAPS: 250.0000 mg | ORAL_CAPSULE | Freq: Two times a day (BID) | ORAL | Status: AC

## 2011-05-12 MED ORDER — HYDROCORTISONE ACETATE 25 MG RE SUPP: 25.0000 mg | Freq: Two times a day (BID) | RECTAL | Status: AC | PRN

## 2011-05-12 MED ORDER — OXYCODONE HCL 5 MG PO TABS
ORAL_TABLET | ORAL | Status: AC
  Administered 2011-05-12: 5 mg via ORAL
  Filled 2011-05-12: qty 1

## 2011-05-12 MED ORDER — METOPROLOL TARTRATE 25 MG PO TABS: 12.5000 mg | ORAL_TABLET | Freq: Two times a day (BID) | ORAL | Status: DC

## 2011-05-12 MED ORDER — CEFAZOLIN SODIUM-DEXTROSE 2-3 GM-% IV SOLR: 2.0000 g | INTRAVENOUS | Status: DC

## 2011-05-12 MED ORDER — NEPRO/CARBSTEADY PO LIQD: 237.0000 mL | Freq: Three times a day (TID) | ORAL | Status: DC | PRN

## 2011-05-12 MED ORDER — EZETIMIBE-SIMVASTATIN 10-40 MG PO TABS: 1.0000 | ORAL_TABLET | Freq: Every day | ORAL | Status: DC

## 2011-05-12 NOTE — Procedures (Addendum)
I was present at this session.  I have reviewed the session itself and made appropriate changes.  Alerted by RN to a "rash" over right arm- on exam looks like early Zoster- discussed with Dr.Abdullah- will start Valtrex 500mg  PO Qday for 7 days.   Jarvin Ogren K. 5/4/20139:47 AM

## 2011-05-12 NOTE — Progress Notes (Signed)
Patient ID: Alan [name], male   DOB: [birth year], 78 y.o.   MRN: [id number]   [facility] Progress Note    Subjective:   No emerging complaints- apprehensive that he will have pain on dialysis   Objective:   BP 131/58  Pulse 82  Temp(Src) 98.6 F (37 C) (Oral)  Resp 16  Ht 6\' 1"  (1.854 m)  Wt 114.3 kg (251 lb 15.8 oz)  BMI 33.25 kg/m2  SpO2 97%  Physical Exam: WUJ:[id number] sleeping on dialysis AOZ:HYQMV RRR, Normal S1 with loud S2 Resp:CTA bilaterally, no rales/rhonchi HQI:ONGE, flat, non-tender, BS normal Ext:No LE edema.   Labs: BMET  Lab 05/10/11 0805 05/08/11 0724  NA 133* 133*  K 4.1 3.7  CL 97 96  CO2 24 25  GLUCOSE 171* 141*  BUN 34* 36*  CREATININE 7.02* 7.58*  ALB -- --  CALCIUM 8.7 8.8  PHOS 3.2 2.3   CBC  Lab 05/10/11 0940 05/08/11 0723  WBC 7.8 8.2  NEUTROABS -- --  HGB 11.3* 11.4*  HCT 34.5* 34.2*  MCV 91.3 90.5  PLT 213 147*    @IMGRELPRIORS @ Medications:      . allopurinol  150 mg Oral Daily  . aspirin EC  81 mg Oral Daily  . calcium carbonate  200 mg of elemental calcium Oral TID WC  .  ceFAZolin (ANCEF) IV  2 g Intravenous Q T,Th,Sat-1800  . ezetimibe-simvastatin  1 tablet Oral QHS  . fentaNYL  250 mcg Intravenous Once  . hydrocortisone  25 mg Rectal BID  . midazolam  10 mg Intravenous Once  . multivitamin  1 tablet Oral QHS  . saccharomyces boulardii  250 mg Oral BID  . sodium chloride  250 mL Intravenous Once  . sodium chloride  3 mL Intravenous Q12H  . sodium chloride  3 mL Intravenous Q12H  . temazepam  15 mg Oral QHS  . warfarin  2.5 mg Oral Custom  . warfarin  5 mg Oral Custom  . Warfarin - Pharmacist Dosing Inpatient   Does not apply q1800  . DISCONTD: metoprolol tartrate  37.5 mg Oral BID     Assessment/ Plan:   1. MSSA bacteremia/early diskitis/osteomyelitis- per MRI (most pronounced at L1-L2); neg TEE; On Ancef x 6 weeks per ID (first dose Tu 05/08/11); to be given outpt HD center; last  dose Thu 06/21/11  2. ESRD -TTS SWAF- tolerating HD well with anticipated discharge afterwards  3. Anemia - Hgb 11.3; given 6.25 Aranesp x 1; hold ESA and iron for now; follow trend  4. Secondary hyperparathyroidism - phos ^3.2, ca 8.7; still on Tums with meals; follow closely  5. HTN/volume/CAD/AS - BP improved off metoprolol: noted plans for additional transthoracic views of the aortic valve to see if AS severe and needs f/u.  6. Nutrition - albumin 2.6; factor in volume removal with HD; cont ^ high protein renal diet and suppl  7. Atrial fibrillation - Rate controlled off BB; on Coumadin per pharmacy; INR therapeutic 2.04  8. Diarrhea- improved on probiotics, (C.difficile PCR negative)  9. DM- CBG and SSI per primary  9. Disposition/Deconditioned - PT/OT while inpt and plans for discharge home with wife vs anticipated SNF; suspect after HD in am     Zetta Bills, MD 05/12/2011, 9:28 AM

## 2011-05-12 NOTE — Progress Notes (Signed)
Reviewed discharge instructions and medications with patient and wife. All questions answered. Steele Berg RN

## 2011-05-12 NOTE — Progress Notes (Signed)
   CARE MANAGEMENT NOTE 05/12/2011  Patient:  Alan [name], Alan [name]   Account Number:  [id number]  Date Initiated:  05/09/2011  Documentation initiated by:  Onnie Boer  Subjective/Objective Assessment:   PT WAS ADMITTED WITH SEPSIS     Action/Plan:   PROGRESSION OF CARE AND DISCHARGE PLANNING   Anticipated DC Date:  05/12/2011   Anticipated DC Plan:  SKILLED NURSING FACILITY  In-house referral  Clinical Social Worker      DC Associate Professor  CM consult      [facility] Choice  HOME HEALTH   Choice offered to / List presented to:  C-3 Spouse        HH arranged  HH-1 RN  HH-2 PT      Status of service:  Completed, signed off Medicare Important Message given?   (If response is "NO", the following Medicare IM given date fields will be blank) Date Medicare IM given:   Date Additional Medicare IM given:    Discharge Disposition:  HOME W HOME HEALTH SERVICES  Per UR Regulation:  Reviewed for med. necessity/level of care/duration of stay  If discussed at Long Length of Stay Meetings, dates discussed:    Comments:  05/12/2011 1430 Spoke to wife and offered choice for [facility]. Wife wanted agency that will accept his insurance and did not have preference. Contacted [facility] for [facility] PT/RN for home. Pt states he did not need any DME at home. Isidoro Donning RN CCM Case Mgmt phone [phone]  05/12/2011 1200 Pt in dialysis. Spoke to pt and gave permission to speak to wife. States he does not believe he will d/c today. Explained NCM will get HH set up with wife. Isidoro Donning RN CCM Case Mgmt phone [phone]  05/11/11 Onnie Boer, RN, BSN 1641 PT WILL NEED HH PT ARRANGED AT DC.  D/C WAS DELAYED TODAY D/T DECREASED BP.  WILL F/U.  05/10/11 Onnie Boer, RN, BSN 1535 PT NEEDS TO PARTICIPATE WITH PT TODAY AS HE IS MEDICALLY READY FOR DC, WILL NEED EVAL TO DETERMINE A SAFE DC PLAN. PT HAVING HD TODAY.  HOPEFULLY CAN DC LATER THIS EVENING.  05/09/11 Onnie Boer, RN, BSN 1625 PT IS TO HAVE TEE TODAY  AND WILL HAVE PT/OT EVAL ON TOMORROW.  WILL F/U.

## 2011-05-12 NOTE — Discharge Summary (Addendum)
Patient ID: Alan [name] MRN: [id number] DOB/AGE: 76-Nov-1933 76 y.o.  Admit date: 05/03/2011 Discharge date: 05/12/2011  Primary Care Physician:  Junious Silk, MD, MD   Discharge Diagnoses:    Present on Admission:  .MSSA Sepsis .Healthcare-associated pneumonia .End stage renal disease on dialysis .Atrial fibrillation .Toxic metabolic encephalopathy .Bacteremia due to Gram-positive bacteria .Dehydration .Diabetes mellitus .CAD (coronary artery disease) .Aortic stenosis .Hypotension RUE rash,? Early  herpes zoster  Medication List  As of 05/12/2011 10:56 AM   STOP taking these medications               ferrous fumarate 325 (106 FE) MG Tabs         TAKE these medications         allopurinol 300 MG tablet   Commonly known as: ZYLOPRIM   Take 150 mg by mouth daily.      aspirin EC 81 MG tablet   Take 81 mg by mouth daily.      calcium carbonate 500 MG chewable tablet   Commonly known as: TUMS - dosed in mg elemental calcium   Chew 1 tablet by mouth 3 (three) times daily before meals. Phosphorous binder      ceFAZolin 2-3 GM-% Solr   Commonly known as: ANCEF   Inject 50 mLs (2 g total) into the vein every Tuesday, Thursday, and Saturday at 6 PM. Last dose 6/13      estazolam 2 MG tablet   Commonly known as: PROSOM   Take 1 mg by mouth at bedtime as needed. For sleep      feeding supplement (NEPRO CARB STEADY) Liqd   Take 237 mLs by mouth 3 (three) times daily as needed (Supplement).      glimepiride 4 MG tablet   Commonly known as: AMARYL   Take 2 mg by mouth daily before breakfast.      hydrocortisone 25 MG suppository   Commonly known as: ANUSOL-HC   Place 1 suppository (25 mg total) rectally 2 (two) times daily as needed for hemorrhoids.      metoprolol tartrate 25 MG tablet   Commonly known as: LOPRESSOR   Take 0.5 tablets (12.5 mg total) by mouth 2 (two) times daily. Hold for systolic blood pressure(top number) less than 110 ,and heart rate  less than 60      multivitamin Tabs tablet   Take 1 tablet by mouth daily.      saccharomyces boulardii 250 MG capsule   Commonly known as: FLORASTOR   Take 1 capsule (250 mg total) by mouth 2 (two) times daily.      traMADol 50 MG tablet   Commonly known as: ULTRAM   Take 50 mg by mouth 2 (two) times daily as needed. pain      warfarin 5 MG tablet   Commonly known as: COUMADIN   Take 2.5-5 mg by mouth daily. Takes 2.5mg  daily except 5mg  on Tuesday,  Thursday, and Sunday      ezetimibe-simvastatin 10-40 MG per tablet             TAKE 1 TABLET  DAILY AT BEDTIME           Valtrex 500 mg po daily for 7 days      Consults:  PPCM,RENAL,ID   Significant Diagnostic Studies:  Mr Lumbar Spine Wo Contrast  05/04/2011  *RADIOLOGY REPORT*  Clinical Data: Renal failure patient.  Back pain and bacteremia. Assess for diskitis.  MRI LUMBAR SPINE WITHOUT CONTRAST  Technique:  Multiplanar  and multiecho pulse sequences of the lumbar spine were obtained without intravenous contrast.  Comparison: None.  Findings: There is abnormal edema within the intervertebral discs and the adjacent endplates at T10-11, T11-12, L1-2 and L4-5. Changes are most pronounced at the L1-2 level.  There are endplate disc herniations at this level.  The findings could represent early diskitis and osteomyelitis given this clinical history.  Without this clinical history, these findings could be degenerative.  There is moderate stenosis of the L1-2 level because of bulging of the disc in combination with facet and ligamentous hypertrophy. There is moderate stenosis at the L3-4 level because of disc bulging and facet and ligamentous hypertrophy.  There is severe stenosis at the L4-5 level because of end plate osteophytes, protrusion of disc material and facet and ligamentous hypertrophy. There is moderate stenosis of the lateral recesses and foramina at L5-S1 because of end plate osteophytes, bulging of the disc and facet  hypertrophy.  Facet degeneration throughout the lumbar region is present.  There is a small amount of fluid associated with the facet on the right at L1-2.  This is indeterminate for degenerative versus possible early joint infection.  IMPRESSION: Abnormal edema affecting the intervertebral discs and endplates at T10-11, T11-12, L1-2 and L4-5.  Changes are most pronounced at the L1-2 level.  In this setting of bacteremia, these findings could be due to early diskitis/osteomyelitis.  There is no evidence of frank fluid collection within the disc.  There is no evidence of epidural abscess. There is some edematous change of the facets, most notable on the right at L1-2.  This is probably degenerative, but one could not rule out early infection of this joint.  However, it is possible these findings could be degenerative.  This scan does not definitively diagnosis infection.  Multifactorial spinal stenosis at L4-5 that could be symptomatic.  Original Report Authenticated By: Thomasenia Sales, M.D.   Dg Chest Port 1 View  05/04/2011  *RADIOLOGY REPORT*  Clinical Data: Evaluate for infiltrate  PORTABLE CHEST - 1 VIEW  Comparison: 05/03/2011  Findings: Mild left basilar opacity, possibly atelectasis. No pleural effusion or pneumothorax.  Stable cardiomegaly. Postsurgical changes related to prior CABG.  IMPRESSION: Mild left basilar opacity, possibly atelectasis.  Stable cardiomegaly.  Original Report Authenticated By: Charline Bills, M.D.   Dg Chest Port 1 View  05/03/2011  *RADIOLOGY REPORT*  Clinical Data: Fever and shortness of breath  PORTABLE CHEST - 1 VIEW  Comparison: 10/29/2010  Findings: Postoperative changes in the mediastinum, stable since previous study.  Shallow inspiration.  Heart size and pulmonary vascularity seem prominent suggesting congestion.  Minimal infiltration or atelectasis in the lung bases.  Appearance is similar to previous study.  No blunting of costophrenic angles.  No pneumothorax.   IMPRESSION: Cardiac enlargement with mild pulmonary vascular congestion.  Mild infiltration or atelectasis in the lung bases.  Original Report Authenticated By: Marlon Pel, M.D.   Ir Shuntogram/fistulagram Right  04/16/2011  *RADIOLOGY REPORT*  Clinical history:Decreased access flows.  PROCEDURE(S): RIGHT UPPER EXTREMITY FISTULOGRAM; VENOUS ANGIOPLASTY  Physician: Rachelle Hora. Henn, MD  Medications:None   Moderate sedation time:None  Fluoroscopy time: 1.6 minutes  Contrast:  42 ml Omnipaque-300  Procedure:The procedure was explained to the patient.  The risks and benefits of the procedure were discussed and the patient's questions were addressed.  Informed consent was obtained from the patient.  Right upper extremity fistula was accessed with an angiocath.  Fistulogram images were obtained.  Arm was prepped and  draped in a sterile fashion.  Maximal barrier sterile technique was utilized including caps, mask, sterile gowns, sterile gloves, sterile drape, hand hygiene and skin antiseptic.  The skin was anesthetized with 1% lidocaine.  Angiocath was exchanged for a 6- [place] vascular sheath over a Bentson wire. The focal stenosis in the mid humeral region was angioplastied with a 5 mm x 40 mm Mustang balloon.  Follow-up fistulogram images were obtained.  This area was treated a second time with the 5 mm balloon and final fistulogram images were obtained.  The sheath was removed with a pursestring suture.  Findings:The right upper arm cephalic vein fistula is patent. Again seen is a focal stenosis in the mid arm with the collateral vein at the area of stenosis.  The area of stenosis markedly improved following balloon angioplasty.  Central veins remain patent.  Again noted is a small caliber of the vein just beyond the arterial anastomosis but this has not significantly changed. Again noted is a small aneurysm just below the collateral vein in the mid humeral region. This aneurysm most likely represents a venous  access site.  Impression:Successful balloon angioplasty of the focal stenosis in the right upper arm cephalic vein.  Original Report Authenticated By: Richarda Overlie, M.D.    Brief H and P: For complete details please refer to admission H and P, but in brief  A 78 year old gentleman with end-stage renal disease and diabetes who was brought in by his wife secondary to altered mental status. Patient apparently has been fine until yesterday when she noted that he was more confused than usual associated with some fever and chills. He also has more shortness of breath and cough and generally not doing well. She brought him to the emergency room where he was found to have a fever to 102, chest x-ray that is suggestive of pneumonia and still confused. Most of the history was therefore obtained from his . No nausea vomiting or diarrhea no increased leg swelling, he is mildly hypotensive has SIRS with evidence of possible pneumonia.   Hospital Course:  Principal Problem:  *Sepsis  - Patient was admitted and started on broad spectrum antibiotics ,blood culture grew MSSA ,patient was seen by ID and antibiotics tailored to Ancef , recommendation is to continue Ancef IV for total of 6 weeks therapy (last dose 6/13 ) to be given with HD on TTS - TEE is negative for endocarditis, repeat BCx (04/26 with NGTD) MRI back showed possiblo early diskitis/osteomyelitis.  Hypotension:  -Patient was  Hypotensive on 5/3 , he was on  metoprolol 37.5 mg bid  Which was held ,he was given a bolus   250 cc of normal saline. Bp stabilized ,will decrease metoprolol dose to 12.5 mg bid with parameters to hold for SBP<110 ,HR<60. Active Problems:  Atrial fibrillation  - rate controlled . Metoprolol dose adjusted as above  - continue Coumadin per outpatient regimen . INR 2.04 today ,monitor as outpatient and adjust dose as needed. Aortic stenosis   TEE on 5/1 showed moderate AS  ,plans was  to get transthoracic views to assess the  severity of AS. 2 D echo ordered inpatient however patient and spouse declined and stated that they prefer to follow with Dr [place] as outpatient ,as per patient he has appointment scheduled 5/15 .  End stage renal disease on dialysis  - on HD TTS, was last dialyzed today  Suspected early herpes zoster of RUE: Valtrex ordered ,contact precautions  Toxic metabolic encephalopathy  -Resolved  Diabetes mellitus  - CBG currently ranging 15-221 ,resume meds on discharge,advised to monitor as outpatient  Anemia of chronic disease  - Hg/Hct remain stable and at pt's baseline  Diarrhea  - now improved  - C.Diff by PCR negative ,continue florastor  Disposition  Patient is stable for discharge home with home health services today  Subjective: Patient seen and examined ,denies any complaints and eager to go home     Filed Vitals:   05/12/11 1000  BP: 127/57  Pulse: 58  Temp:   Resp:     General: Alert, awake, oriented x3, in no acute distress.  HEENT: No bruits, no goiter.  Heart: Irregular irregular rhythm, systolic murmer radiating to the neck Lungs: Clear to auscultation bilaterally.  Abdomen: Soft, nontender, nondistended, positive bowel sounds.  Extremities: No clubbing cyanosis or edema with positive pedal pulses.  Neuro: Grossly intact, nonfocal.    Disposition and Follow-up:  To home with home health services(PT,RN) Follow with PCP,cardiology ,ID and renal    Time spent on Discharge: approximately 45  minutes   Signed: Kate Larock 05/12/2011, 10:56 AM

## 2011-05-14 ENCOUNTER — Other Ambulatory Visit (HOSPITAL_COMMUNITY): Payer: MEDICARE

## 2011-05-15 LAB — PROTIME-INR: INR: 2.6 — AB (ref 0.9–1.1)

## 2011-05-18 ENCOUNTER — Ambulatory Visit: Payer: Self-pay | Admitting: Cardiology

## 2011-05-18 ENCOUNTER — Encounter (HOSPITAL_COMMUNITY): Payer: Self-pay | Admitting: Emergency Medicine

## 2011-05-18 ENCOUNTER — Emergency Department (HOSPITAL_COMMUNITY)
Admission: EM | Admit: 2011-05-18 | Discharge: 2011-05-18 | Disposition: A | Discharge status: Home / Self Care | Payer: Medicare Other | Attending: Emergency Medicine | Admitting: Emergency Medicine

## 2011-05-18 DIAGNOSIS — Z7901 Long term (current) use of anticoagulants: Secondary | ICD-10-CM

## 2011-05-18 DIAGNOSIS — E1139 Type 2 diabetes mellitus with other diabetic ophthalmic complication: Secondary | ICD-10-CM | POA: Insufficient documentation

## 2011-05-18 DIAGNOSIS — E1149 Type 2 diabetes mellitus with other diabetic neurological complication: Secondary | ICD-10-CM | POA: Insufficient documentation

## 2011-05-18 DIAGNOSIS — L270 Generalized skin eruption due to drugs and medicaments taken internally: Secondary | ICD-10-CM

## 2011-05-18 DIAGNOSIS — T361X5A Adverse effect of cephalosporins and other beta-lactam antibiotics, initial encounter: Secondary | ICD-10-CM | POA: Insufficient documentation

## 2011-05-18 DIAGNOSIS — D692 Other nonthrombocytopenic purpura: Secondary | ICD-10-CM

## 2011-05-18 DIAGNOSIS — Z951 Presence of aortocoronary bypass graft: Secondary | ICD-10-CM | POA: Insufficient documentation

## 2011-05-18 DIAGNOSIS — E11319 Type 2 diabetes mellitus with unspecified diabetic retinopathy without macular edema: Secondary | ICD-10-CM | POA: Insufficient documentation

## 2011-05-18 DIAGNOSIS — I4891 Unspecified atrial fibrillation: Secondary | ICD-10-CM | POA: Insufficient documentation

## 2011-05-18 DIAGNOSIS — E1142 Type 2 diabetes mellitus with diabetic polyneuropathy: Secondary | ICD-10-CM | POA: Insufficient documentation

## 2011-05-18 DIAGNOSIS — I129 Hypertensive chronic kidney disease with stage 1 through stage 4 chronic kidney disease, or unspecified chronic kidney disease: Secondary | ICD-10-CM | POA: Insufficient documentation

## 2011-05-18 DIAGNOSIS — N189 Chronic kidney disease, unspecified: Secondary | ICD-10-CM | POA: Insufficient documentation

## 2011-05-18 DIAGNOSIS — I2510 Atherosclerotic heart disease of native coronary artery without angina pectoris: Secondary | ICD-10-CM | POA: Insufficient documentation

## 2011-05-18 LAB — CBC
MCHC: 32.6 g/dL (ref 30.0–36.0)
MCV: 92.3 fL (ref 78.0–100.0)
Platelets: 249 10*3/uL (ref 150–400)
RDW: 13.7 % (ref 11.5–15.5)
WBC: 3 10*3/uL — ABNORMAL LOW (ref 4.0–10.5)

## 2011-05-18 LAB — COMPREHENSIVE METABOLIC PANEL
ALT: 10 U/L (ref 0–53)
AST: 45 U/L — ABNORMAL HIGH (ref 0–37)
Albumin: 2.6 g/dL — ABNORMAL LOW (ref 3.5–5.2)
CO2: 31 mEq/L (ref 19–32)
Calcium: 8.7 mg/dL (ref 8.4–10.5)
Sodium: 139 mEq/L (ref 135–145)
Total Protein: 6.8 g/dL (ref 6.0–8.3)

## 2011-05-18 LAB — DIFFERENTIAL
Basophils Absolute: 0 10*3/uL (ref 0.0–0.1)
Basophils Relative: 0 % (ref 0–1)
Eosinophils Relative: 1 % (ref 0–5)
Lymphocytes Relative: 26 % (ref 12–46)

## 2011-05-18 NOTE — ED Notes (Signed)
Pt a x 4. NAD. petachiae to lower extremities family reporting from reaction ancef, denying any pain. No bleeding noted in mouth or nose. Trace pedal edema noted. Last dialyzed yesterday. Denying any cp or sob. Wife at bedside

## 2011-05-18 NOTE — ED Provider Notes (Signed)
History     CSN: [id number]  Arrival date & time 05/18/11  1434   First MD Initiated Contact with Patient 05/18/11 1629      Chief Complaint  Patient presents with  . Bleeding/Bruising    (Consider location/radiation/quality/duration/timing/severity/associated sxs/prior treatment) Patient is a 78 y.o. male presenting with rash. The history is provided by the patient.  Rash  This is a new problem. Episode onset: about 1 week ago. The problem has been gradually worsening. There has been no fever. The rash is present on the torso, right arm, right lower leg and left lower leg. The patient is experiencing no pain. He has tried nothing for the symptoms. The treatment provided no relief. Risk factors: On Ancef.    Past Medical History  Diagnosis Date  . Diabetes mellitus   . Gout   . Hypertension   . Hyperlipidemia   . Chronic kidney disease     Started dialysis October 2012  . Arthritis   . Prostate cancer     s/p seed implant  . Cataract     BILATERAL-BEEN REMOVED  . Neuromuscular disorder     DABETIC NEUROPATHY-LOWER EXTREMITY  . Atrial fibrillation     on coumadin  . Aortic stenosis     moderate to severe, not felt to be a surgical candidate  . Anemia     secondary to end stage renal disease  . Coronary artery disease     remote CABG in 1996; last stress test in 2005; last cath 2005  . Diabetic neuropathy   . Edema   . PAD (peripheral artery disease)   . Diabetic retinopathy   . Chronic anticoagulation     on coumadin  . Nephrolithiasis     prior history of percutaneous nephrostomy  . Shortness of breath     Past Surgical History  Procedure Date  . Ventral hernia repair   . Inguinal hernia repair     right side  . Colonoscopy   . Kidney stone surgery   . Polypectomy   . Tonsillectomy   . Toe surgery     removal little toe right foot  . Cardiac catheterization 04/19/2003    SEVERE 2 VESSEL OBSTRUCTIVE ATHERSCLEROTIC CAD  . Coronary artery bypass graft  1996    LIMA GRAFT TO THE LAD, SEQUENTIAL SAPHENOUS  VEIN GRAFT TO THE FIRST AND SECOND DIAGONIAL BRANCHES, SEQUENTIAL SAPHENOUS VEIN GRAFT TO THE ACUTE MARGINAL, POSTERIOR DESCENDING, AND POSTERIOR LATERAL BRANCHES OF THE RIGHT CORONARY ARTERY  . Radioactive seed implant   . US echocardiography 09/06/2009    EF 55-60%  . Cardiovascular stress test 04/13/2003    EF 54%. EVIDENCE OF ANTERO-APICAL ISCHEMIA. NORMAL LV SIZE AND FUNCTION  . Tee without cardioversion 05/09/2011    Procedure: TRANSESOPHAGEAL ECHOCARDIOGRAM (TEE);  Surgeon: Laurey Morale, MD;  Location: [facility] ENDOSCOPY;  Service: Cardiovascular;  Laterality: N/A;    Family History  Problem Relation Age of Onset  . Heart disease Father   . Hypertension Brother   . Stroke Brother     History  Substance Use Topics  . Smoking status: Former Smoker    Quit date: 04/10/1968  . Smokeless tobacco: Former Neurosurgeon    Types: Chew   Comment: DO IT WHEN GET TASTE FOR IT ONCE-TWICE A MONTH  . Alcohol Use: 10.0 oz/week    20 drink(s) per week     ' I quit That "      Review of Systems  Constitutional: Negative for fever.  HENT: Negative for rhinorrhea, drooling and neck pain.   Eyes: Negative for pain.  Respiratory: Negative for cough and shortness of breath.   Cardiovascular: Negative for chest pain and leg swelling.  Gastrointestinal: Negative for nausea, vomiting, abdominal pain and diarrhea.  Genitourinary: Negative for dysuria and hematuria.  Musculoskeletal: Negative for gait problem.  Skin: Positive for rash. Negative for color change.  Neurological: Negative for numbness and headaches.  Hematological: Negative for adenopathy.  Psychiatric/Behavioral: Negative for behavioral problems.  All other systems reviewed and are negative.    Allergies  Ancef  Home Medications   Current Outpatient Rx  Name Route Sig Dispense Refill  . ALLOPURINOL 300 MG PO TABS Oral Take 150 mg by mouth daily.    . ASPIRIN EC 81 MG PO TBEC Oral  Take 81 mg by mouth daily.    [name] CALCIUM CARBONATE ANTACID 500 MG PO CHEW Oral Chew 1 tablet by mouth 3 (three) times daily before meals. Phosphorous binder    . CEFAZOLIN SODIUM-DEXTROSE 2-3 GM-% IV SOLR Intravenous Inject 50 mLs (2 g total) into the vein every Tuesday, Thursday, and Saturday at 6 PM. Last dose 6/13 18 each 0    Give on TTS with dialysis,Last dose 6/13  . ESTAZOLAM 2 MG PO TABS Oral Take 1 mg by mouth at bedtime as needed. For sleep    . EZETIMIBE-SIMVASTATIN 10-40 MG PO TABS Oral Take 1 tablet by mouth at bedtime. 30 tablet 0  . GLIMEPIRIDE 4 MG PO TABS Oral Take 2 mg by mouth daily before breakfast.      . HYDROCORTISONE ACETATE 25 MG RE SUPP Rectal Place 1 suppository (25 mg total) rectally 2 (two) times daily as needed for hemorrhoids. 24 suppository 0  . METOPROLOL TARTRATE 25 MG PO TABS Oral Take 12.5 mg by mouth 2 (two) times daily. Hold for systolic blood pressure(top number) less than 110 ,and heart rate less than 60    . RENA-VITE PO TABS Oral Take 1 tablet by mouth daily.      [name] NEPRO/CARB STEADY PO LIQD Oral Take 237 mLs by mouth 3 (three) times daily as needed.    [name] SACCHAROMYCES BOULARDII 250 MG PO CAPS Oral Take 1 capsule (250 mg total) by mouth 2 (two) times daily. 20 capsule 0  . TRAMADOL HCL 50 MG PO TABS Oral Take 50 mg by mouth 2 (two) times daily as needed. pain    . VALACYCLOVIR HCL 500 MG PO TABS Oral Take 1 tablet (500 mg total) by mouth daily. Take after dialysis on dialysis days 7 tablet 0  . WARFARIN SODIUM 5 MG PO TABS Oral Take 2.5-5 mg by mouth every evening. Takes 2.5mg  daily except 5mg  on Tuesday,  Thursday, and Sunday      BP 114/43  Pulse 65  Temp(Src) 97.8 F (36.6 C) (Oral)  Resp 16  SpO2 98%  Physical Exam  Constitutional: He is oriented to person, place, and time. He appears well-developed and well-nourished.  HENT:  Head: Normocephalic and atraumatic.  Right Ear: External ear normal.  Left Ear: External ear normal.  Nose: Nose  normal.  Mouth/Throat: Oropharynx is clear and moist. No oropharyngeal exudate.  Eyes: Conjunctivae and EOM are normal. Pupils are equal, round, and reactive to light.  Neck: Normal range of motion. Neck supple.  Cardiovascular: Normal rate, regular rhythm and intact distal pulses.  Exam reveals no gallop and no friction rub.   Murmur (systolic murmur) heard. Pulmonary/Chest: Effort normal and breath sounds normal.  No respiratory distress. He has no wheezes.  Abdominal: Soft. Bowel sounds are normal. He exhibits no distension. There is no tenderness.  Musculoskeletal: Normal range of motion. He exhibits no edema and no tenderness.  Neurological: He is alert and oriented to person, place, and time.  Skin: Skin is warm and dry.       Multiple palpable purpura of bilateral lower extremities extending from mid calf to dorsum of foot w/ several hemorrhagic bullae distally. Several small lesions also noted on right elbow, left dorsum of hand, right knee, and right thigh.   Psychiatric: He has a normal mood and affect. His behavior is normal.    ED Course  Procedures (including critical care time)  Labs Reviewed  CBC - Abnormal; Notable for the following:    WBC 3.0 (*)    RBC 3.65 (*)    Hemoglobin 11.0 (*)    HCT 33.7 (*)    All other components within normal limits  COMPREHENSIVE METABOLIC PANEL - Abnormal; Notable for the following:    Glucose, Bld 212 (*)    BUN 28 (*)    Creatinine, Ser 6.51 (*)    Albumin 2.6 (*)    AST 45 (*)    GFR calc non Af Amer 7 (*)    GFR calc Af Amer 8 (*)    All other components within normal limits  APTT - Abnormal; Notable for the following:    aPTT 51 (*)    All other components within normal limits  PROTIME-INR - Abnormal; Notable for the following:    Prothrombin Time 29.9 (*)    INR 2.79 (*)    All other components within normal limits  LACTIC ACID, PLASMA - Abnormal; Notable for the following:    Lactic Acid, Venous 2.3 (*)    All other  components within normal limits  DIFFERENTIAL  PROCALCITONIN   No results found.   1. Drug rash   2. Purpura       MDM  7:40 PM 78 y.o. male w hx of CKD on HD, DM, a fib on coumadin pw rash that began approx 1 week ago during admission for MSSA bacteremia. Pt was seen by ID, d/c on Ancef which he was getting at dialysis on T/R/S. Wife notes worsening of lesions from right arm to trunk and now bilateral LE's. Seems to be worse after getting ancef at dialysis. She reports this anti-biotic has been changed. Pt has otherwise been well, denies pain/sob. Pt AFVSS here, appears well on exam.    7:40 PM: Pt continues to appear well on exam. Suspect his rash to be d/t a drug reaction, likely to Ancef. His wife states that the pt is being taken off of Ancef and started on a new antibiotic asap by his doctor, Dr. Lorenso Courier. INR slightly elevated at 2.79, pt is on coumadin, slightly elev lactate which does not appear to be contributory. I have discussed the diagnosis/risks/treatment options with the patient and family and believe the pt to be eligible for discharge home to follow-up with pcp in 2 days if no better. We also discussed returning to the ED immediately if new or worsening sx occur. We discussed the sx which are most concerning (e.g., worsening lesions) that necessitate immediate return. Any new prescriptions provided to the patient are listed below.  New Prescriptions   No medications on file    Clinical Impression 1. Drug rash   2. Purpura        Purvis Sheffield, MD  05/18/11 1940 

## 2011-05-18 NOTE — ED Notes (Signed)
Pt here with rash and blisters to feet that family thinks is because of a reaction to ancef that pt has been taking; pt sts last dose was yesterday in dialysis; no SOB noted

## 2011-05-18 NOTE — ED Provider Notes (Signed)
78yo M, c/o gradual onset and worsening of constant "rash" to bilat LE's and hands that began after he was discharged from the hospital for MSSA sepsis 5 days ago.  Pt's wife states he is still taking ancef during HD sessions.  No new meds.  Pt's wife concerned because she "is starting to see blisters now" on his bilat feet.  Afebrile, NAD, resps easy, +small and large bullae to bilat dorsal and plantar feet with bilat dorsal feet to lower tibias with purpura.  No rash to abd, scattered purpura to bilat dorsal hands.    I saw this patient for screening purposes.  Pt is stable, but further workup and evaluation is needed beyond triage capabilities.  Griffon Herberg Allison Quarry, DO 05/18/11 1525

## 2011-05-18 NOTE — ED Notes (Signed)
Per pt and family, pt was recently d/c from [facility] and think the Ancef that was given has caused him to have an "allergic reaction."  Small red spots on his hands and his feet are red with black scab-like areas noted. It is not painful upon palpation.  He has edema in both his feet.

## 2011-05-18 NOTE — ED Provider Notes (Signed)
78 year old male has been on Ancef for a staph infection. He broke out in a rash across his legs and his Ancef is being changed to a different antibiotic. He is also on warfarin and is a dialysis patient. His wife is concerned because he had circulation issues and had lost a toe. Exam shows palpable purpura with some hemorrhagic Boulay consistent with a drug reaction. Capillary refill is prompt and there is no evidence of any vascular compromise. He is being switched off of Ancef and I do not think any other treatment is needed at this time. INR is noted to be therapeutic  Dione Booze, MD 05/18/11 1712

## 2011-05-20 NOTE — ED Provider Notes (Signed)
I saw and evaluated the patient, reviewed the resident's note and I agree with the findings and plan.   Henna Derderian, MD 05/20/11 0021 

## 2011-05-21 ENCOUNTER — Other Ambulatory Visit (HOSPITAL_COMMUNITY): Payer: Self-pay | Admitting: Cardiology

## 2011-05-21 DIAGNOSIS — I359 Nonrheumatic aortic valve disorder, unspecified: Secondary | ICD-10-CM

## 2011-05-23 ENCOUNTER — Ambulatory Visit: Payer: MEDICARE | Admitting: Cardiology

## 2011-05-23 ENCOUNTER — Other Ambulatory Visit (HOSPITAL_COMMUNITY): Payer: MEDICARE

## 2011-05-24 ENCOUNTER — Telehealth: Payer: Self-pay | Admitting: *Deleted

## 2011-05-24 ENCOUNTER — Ambulatory Visit: Payer: Self-pay | Admitting: Cardiology

## 2011-05-24 DIAGNOSIS — I4891 Unspecified atrial fibrillation: Secondary | ICD-10-CM

## 2011-05-24 DIAGNOSIS — Z7901 Long term (current) use of anticoagulants: Secondary | ICD-10-CM

## 2011-05-24 NOTE — Telephone Encounter (Signed)
Patient is getting vancomycin at HD for sepsis, dose is vancomycin 1 gm.

## 2011-05-29 LAB — PROTIME-INR: INR: 2.8 — AB (ref 0.9–1.1)

## 2011-05-31 ENCOUNTER — Ambulatory Visit: Payer: Self-pay | Admitting: Cardiology

## 2011-05-31 DIAGNOSIS — Z7901 Long term (current) use of anticoagulants: Secondary | ICD-10-CM

## 2011-05-31 DIAGNOSIS — I4891 Unspecified atrial fibrillation: Secondary | ICD-10-CM

## 2011-06-01 ENCOUNTER — Other Ambulatory Visit: Payer: Self-pay

## 2011-06-01 DIAGNOSIS — L98499 Non-pressure chronic ulcer of skin of other sites with unspecified severity: Secondary | ICD-10-CM

## 2011-06-12 LAB — PROTIME-INR: INR: 2.4 — AB (ref 0.9–1.1)

## 2011-06-13 ENCOUNTER — Inpatient Hospital Stay: Payer: Medicare Other | Admitting: Infectious Disease

## 2011-06-14 ENCOUNTER — Ambulatory Visit: Payer: Self-pay | Admitting: Internal Medicine

## 2011-06-14 DIAGNOSIS — I4891 Unspecified atrial fibrillation: Secondary | ICD-10-CM

## 2011-06-14 DIAGNOSIS — Z7901 Long term (current) use of anticoagulants: Secondary | ICD-10-CM

## 2011-06-15 ENCOUNTER — Encounter: Payer: Self-pay | Admitting: Surgery

## 2011-06-18 ENCOUNTER — Encounter (INDEPENDENT_AMBULATORY_CARE_PROVIDER_SITE_OTHER): Payer: MEDICARE | Admitting: *Deleted

## 2011-06-18 ENCOUNTER — Ambulatory Visit (INDEPENDENT_AMBULATORY_CARE_PROVIDER_SITE_OTHER): Payer: MEDICARE | Admitting: Surgery

## 2011-06-18 ENCOUNTER — Encounter: Payer: Self-pay | Admitting: *Deleted

## 2011-06-18 ENCOUNTER — Encounter: Payer: Self-pay | Admitting: Surgery

## 2011-06-18 VITALS — BP 140/66 | HR 78 | Temp 98.0°F | Ht 72.0 in | Wt 245.6 lb

## 2011-06-18 DIAGNOSIS — I7025 Atherosclerosis of native arteries of other extremities with ulceration: Secondary | ICD-10-CM | POA: Insufficient documentation

## 2011-06-18 DIAGNOSIS — L97909 Non-pressure chronic ulcer of unspecified part of unspecified lower leg with unspecified severity: Secondary | ICD-10-CM

## 2011-06-18 DIAGNOSIS — L98499 Non-pressure chronic ulcer of skin of other sites with unspecified severity: Secondary | ICD-10-CM

## 2011-06-18 DIAGNOSIS — I739 Peripheral vascular disease, unspecified: Secondary | ICD-10-CM

## 2011-06-18 NOTE — Progress Notes (Signed)
[facility] [place]   Patient name: Alan [name] MRN: [id number] DOB: 03/19/1931 Sex: male   Referred by: Dr. Altheimer  Reason for referral:  Chief Complaint  Patient presents with  . PVD    eval heel ulcer/Dr. Mattingly    HISTORY OF PRESENT ILLNESS: The patient is referred today for left leg ulcerations and gangrene as well as a right third toe ulcer.  He also has a heel ulcer which she states has been present for greater than one month. He feels that the toes at change of the past several weeks. This does not appear to be causing him a significant amount of pain. He denies fevers or chills.  The patient does suffer from end-stage renal disease and is on dialysis. He has been seen in our office previously by Dr. Hayes in 2008. At that time ABIs were 0.54 on the right and 0.46 on the left.  He suffers from coronary artery disease and is status post coronary artery bypass grafting in 1996. He is on Coumadin. He is a former smoker and quit in 1970. He is medically managed for his diabetes.  Past Medical History  Diagnosis Date  . Diabetes mellitus   . Gout   . Hypertension   . Hyperlipidemia   . Chronic kidney disease     Started dialysis October 2012  . Arthritis   . Prostate cancer     s/p seed implant  . Cataract     BILATERAL-BEEN REMOVED  . Neuromuscular disorder     DABETIC NEUROPATHY-LOWER EXTREMITY  . Atrial fibrillation     on coumadin  . Aortic stenosis     moderate to severe, not felt to be a surgical candidate  . Anemia     secondary to end stage renal disease  . Coronary artery disease     remote CABG in 1996; last stress test in 2005; last cath 2005  . Diabetic neuropathy   . Edema   . PAD (peripheral artery disease)   . Diabetic retinopathy   . Chronic anticoagulation     on coumadin  . Nephrolithiasis     prior history of percutaneous nephrostomy  . Shortness of breath     Past Surgical History  Procedure Date  .  Ventral hernia repair   . Inguinal hernia repair     right side  . Colonoscopy   . Kidney stone surgery   . Polypectomy   . Tonsillectomy   . Toe surgery     removal little toe right foot  . Cardiac catheterization 04/19/2003    SEVERE 2 VESSEL OBSTRUCTIVE ATHERSCLEROTIC CAD  . Coronary artery bypass graft 1996    LIMA GRAFT TO THE LAD, SEQUENTIAL SAPHENOUS  VEIN GRAFT TO THE FIRST AND SECOND DIAGONIAL BRANCHES, SEQUENTIAL SAPHENOUS VEIN GRAFT TO THE ACUTE MARGINAL, POSTERIOR DESCENDING, AND POSTERIOR LATERAL BRANCHES OF THE RIGHT CORONARY ARTERY  . Radioactive seed implant   . Us echocardiography 09/06/2009    EF 55-60%  . Cardiovascular stress test 04/13/2003    EF 54%. EVIDENCE OF ANTERO-APICAL ISCHEMIA. NORMAL LV SIZE AND FUNCTION  . Tee without cardioversion 05/09/2011    Procedure: TRANSESOPHAGEAL ECHOCARDIOGRAM (TEE);  Surgeon: Dalton S McLean, MD;  Location: [facility];  Service: Cardiovascular;  Laterality: N/A;    History   Social History  . Marital Status: Married    Spouse Name: N/A    Number of Children: N/A  . Years of Education: N/A   Occupational History  .   Not on file.   Social History Main Topics  . Smoking status: Former Smoker    Quit date: 04/10/1968  . Smokeless tobacco: Former User    Types: Chew  . Alcohol Use: No  . Drug Use: No  . Sexually Active: No   Other Topics Concern  . Not on file   Social History Narrative  . No narrative on file    Family History  Problem Relation Age of Onset  . Heart disease Father   . Hypertension Brother   . Stroke Brother     Allergies as of 06/18/2011 - Review Complete 06/18/2011  Allergen Reaction Noted  . Ancef (cefazolin) Rash 05/18/2011    Current Outpatient Prescriptions on File Prior to Visit  Medication Sig Dispense Refill  . allopurinol (ZYLOPRIM) 300 MG tablet Take 150 mg by mouth daily.      . aspirin EC 81 MG tablet Take 81 mg by mouth daily.      . calcium carbonate (TUMS - DOSED IN MG  ELEMENTAL CALCIUM) 500 MG chewable tablet Chew 1 tablet by mouth 3 (three) times daily before meals. Phosphorous binder      . estazolam (PROSOM) 2 MG tablet Take 1 mg by mouth at bedtime as needed. For sleep      . ezetimibe-simvastatin (VYTORIN) 10-40 MG per tablet Take 1 tablet by mouth at bedtime.  30 tablet  0  . glimepiride (AMARYL) 4 MG tablet Take 2 mg by mouth daily before breakfast.        . metoprolol tartrate (LOPRESSOR) 25 MG tablet Take 50 mg by mouth 2 (two) times daily. Hold for systolic blood pressure(top number) less than 110 ,and heart rate less than 60, take 1/2 tablet by mouth bid      . traMADol (ULTRAM) 50 MG tablet Take 50 mg by mouth 2 (two) times daily as needed. pain      . valACYclovir (VALTREX) 500 MG tablet Take 1 tablet (500 mg total) by mouth daily. Take after dialysis on dialysis days  7 tablet  0  . warfarin (COUMADIN) 5 MG tablet Take 2.5-5 mg by mouth every evening. Takes 2.5mg daily except 5mg on Tuesday,  Thursday, and Sunday      . ceFAZolin (ANCEF) 2-3 GM-% SOLR Inject 50 mLs (2 g total) into the vein every Tuesday, Thursday, and Saturday at 6 PM. Last dose 6/13  18 each  0  . multivitamin (RENA-VIT) TABS tablet Take 1 tablet by mouth daily.        . Nutritional Supplements (FEEDING SUPPLEMENT, NEPRO CARB STEADY,) LIQD Take 237 mLs by mouth 3 (three) times daily as needed.         REVIEW OF SYSTEMS: Cardiovascular: No chest pain, chest pressure, palpitations, orthopnea, or dyspnea on exertion. No claudication or rest pain,  No history of DVT or phlebitis. Pulmonary: No productive cough, asthma or wheezing. Neurologic: No weakness, paresthesias, aphasia, or amaurosis. No dizziness. Hematologic: No bleeding problems or clotting disorders. On Coumadin Musculoskeletal: No joint pain or joint swelling. Gastrointestinal: No blood in stool or hematemesis Genitourinary: On dialysis Psychiatric:: No history of major depression. Integumentary: Please see history  of present illness and physical exam Constitutional: No fever or chills.  PHYSICAL EXAMINATION: General: The patient appears their stated age.  Vital signs are BP 140/66  Pulse 78  Temp(Src) 98 F (36.7 C) (Oral)  Ht 6' (1.829 m)  Wt 245 lb 9.6 oz (111.403 kg)  BMI 33.31 kg/m2  SpO2 97% HEENT:    No gross abnormalities Pulmonary: Respirations are non-labored Abdomen: Soft and non-tender  Musculoskeletal: There are no major deformities.   Neurologic: No focal weakness or paresthesias are detected, Skin: Dry gangrene noted to the left second through fifth toe. There is superficial ulceration of the right third toe. There is a large left heel ulcer. There is no evidence of gross infection. There is no foul odor and no drainage. Psychiatric: The patient has normal affect. Cardiovascular: There is a regular rate and rhythm without significant murmur appreciated. Pedal pulses cannot be palpated  Diagnostic Studies: Unable to obtain ankle-brachial indices today do to known noncompressibility. He has dampened digital waveforms and monophasic pedal waveforms. Digital pressures are 64 the right is 67 normal left  Outside Studies/Documentation Historical records were reviewed.  They showed bilateral ulcerations left greater than right  Medication Changes: I will contact Dr. Jordan's office for recommendations regarding stopping his Coumadin for arteriogram  Assessment:  Bilateral ulcerations left greater than right with dry gangrene to the left second through fourth toes and a left heel ulcer. There is a right third toe superficial ulceration Plan: I discussed with the patient and his wife the significance of the physical exam findings today. I think that he is at very high risk for limb loss, certainly on the left leg and potentially on the right. At minimum he is going to lose 4 toes on the left. I'm unsure that he can heal this amputation and therefore he needs further interrogation of his  lower extremity arterial system. I recommended proceeding with angiography. Our plan on accessing the right groin insetting both legs. I'll intervene if possible. This is been scheduled for Wednesday, June 19. He'll need to come off of his Coumadin 5 days prior. His cardiologist, Dr. Jordan will assist and recommendations as to whether or not he needs to be bridged while off of his Coumadin. I again stressed that this is a limb threatening situation. I told him about everything I could to save as much of his leg is possible that this could ultimately lead to an above-knee amputation.     V. Wells Jaquon Gingerich IV, M.D. [facility] [place] Office: [phone] Pager:  [phone]   

## 2011-06-20 ENCOUNTER — Telehealth: Payer: Self-pay | Admitting: Pharmacist

## 2011-06-20 ENCOUNTER — Encounter: Payer: Self-pay | Admitting: Cardiology

## 2011-06-20 ENCOUNTER — Encounter (HOSPITAL_COMMUNITY): Payer: Self-pay | Admitting: Respiratory Therapy

## 2011-06-20 ENCOUNTER — Ambulatory Visit (INDEPENDENT_AMBULATORY_CARE_PROVIDER_SITE_OTHER): Payer: MEDICARE | Admitting: Cardiology

## 2011-06-20 VITALS — BP 107/51 | HR 79 | Ht 72.0 in | Wt 239.0 lb

## 2011-06-20 DIAGNOSIS — N186 End stage renal disease: Secondary | ICD-10-CM

## 2011-06-20 DIAGNOSIS — I2510 Atherosclerotic heart disease of native coronary artery without angina pectoris: Secondary | ICD-10-CM

## 2011-06-20 DIAGNOSIS — I359 Nonrheumatic aortic valve disorder, unspecified: Secondary | ICD-10-CM

## 2011-06-20 DIAGNOSIS — I350 Nonrheumatic aortic (valve) stenosis: Secondary | ICD-10-CM

## 2011-06-20 DIAGNOSIS — Z992 Dependence on renal dialysis: Secondary | ICD-10-CM

## 2011-06-20 DIAGNOSIS — I4891 Unspecified atrial fibrillation: Secondary | ICD-10-CM

## 2011-06-20 DIAGNOSIS — Z7901 Long term (current) use of anticoagulants: Secondary | ICD-10-CM

## 2011-06-20 NOTE — Progress Notes (Signed)
Alan [name] Grace Date of Birth: 03-Mar-1931 Medical Record #[id number]  History of Present Illness: Alan [name] is seen back today for a followup visit. He has been on hemodialysis since October. He is also on anticoagulation now for atrial fibrillation. Recently he has developed gangrene of several toes on his left foot and ulceration on one of his toes on the right foot. He is being considered for amputation of his toes and may require a higher level of amputation. He is scheduled for angiography on June 19. He is very fearful about losing his leg. This is causing no significant discomfort. He's had no increase in edema. His weight is down 8 pounds and he has been tolerating dialysis well. He does note he has had some difficulty with his AV fistula. He denies any chest pain or increased shortness of breath.  Current Outpatient Prescriptions on File Prior to Visit  Medication Sig Dispense Refill  . allopurinol (ZYLOPRIM) 300 MG tablet Take 150 mg by mouth daily.      [name] aspirin EC 81 MG tablet Take 81 mg by mouth daily.      . calcium carbonate (TUMS - DOSED IN MG ELEMENTAL CALCIUM) 500 MG chewable tablet Chew 1 tablet by mouth 3 (three) times daily before meals. Phosphorous binder      . estazolam (PROSOM) 2 MG tablet Take 1 mg by mouth at bedtime as needed. For sleep      . ezetimibe-simvastatin (VYTORIN) 10-40 MG per tablet Take 1 tablet by mouth at bedtime.  30 tablet  0  . glimepiride (AMARYL) 4 MG tablet Take 2 mg by mouth daily before breakfast.        . metoprolol tartrate (LOPRESSOR) 25 MG tablet Take 37.5 mg by mouth 2 (two) times daily. Hold for systolic blood pressure(top number) less than 110 ,and heart rate less than 60, take 1/2 tablet by mouth bid      . Nutritional Supplements (FEEDING SUPPLEMENT, NEPRO CARB STEADY,) LIQD Take 237 mLs by mouth 3 (three) times daily as needed.      . silver sulfADIAZINE (SILVADENE) 1 % cream Apply 1 application topically daily.      . traMADol  (ULTRAM) 50 MG tablet Take 50 mg by mouth 2 (two) times daily as needed. pain      . VANCOMYCIN HCL IV Inject into the vein. Tuesday, Thursday, Saturday      . warfarin (COUMADIN) 5 MG tablet Take 2.5-5 mg by mouth every evening. Takes 2.5mg  daily except 5mg  on Tuesday,  Thursday, and Sunday        Allergies  Allergen Reactions  . Ancef (Cefazolin) Rash    Past Medical History  Diagnosis Date  . Diabetes mellitus   . Gout   . Hypertension   . Hyperlipidemia   . Chronic kidney disease     Started dialysis October 2012  . Arthritis   . Prostate cancer     s/p seed implant  . Cataract     BILATERAL-BEEN REMOVED  . Neuromuscular disorder     DABETIC NEUROPATHY-LOWER EXTREMITY  . Atrial fibrillation     on coumadin  . Aortic stenosis     moderate to severe, not felt to be a surgical candidate  . Anemia     secondary to end stage renal disease  . Coronary artery disease     remote CABG in 1996; last stress test in 2005; last cath 2005  . Diabetic neuropathy   . Edema   .  PAD (peripheral artery disease)   . Diabetic retinopathy   . Chronic anticoagulation     on coumadin  . Nephrolithiasis     prior history of percutaneous nephrostomy  . Shortness of breath     Past Surgical History  Procedure Date  . Ventral hernia repair   . Inguinal hernia repair     right side  . Colonoscopy   . Kidney stone surgery   . Polypectomy   . Tonsillectomy   . Toe surgery     removal little toe right foot  . Cardiac catheterization 04/19/2003    SEVERE 2 VESSEL OBSTRUCTIVE ATHERSCLEROTIC CAD  . Coronary artery bypass graft 1996    LIMA GRAFT TO THE LAD, SEQUENTIAL SAPHENOUS  VEIN GRAFT TO THE FIRST AND SECOND DIAGONIAL BRANCHES, SEQUENTIAL SAPHENOUS VEIN GRAFT TO THE ACUTE MARGINAL, POSTERIOR DESCENDING, AND POSTERIOR LATERAL BRANCHES OF THE RIGHT CORONARY ARTERY  . Radioactive seed implant   . US echocardiography 09/06/2009    EF 55-60%  . Cardiovascular stress test 04/13/2003    EF  54%. EVIDENCE OF ANTERO-APICAL ISCHEMIA. NORMAL LV SIZE AND FUNCTION  . Tee without cardioversion 05/09/2011    Procedure: TRANSESOPHAGEAL ECHOCARDIOGRAM (TEE);  Surgeon: Laurey Morale, MD;  Location: [facility] ENDOSCOPY;  Service: Cardiovascular;  Laterality: N/A;    History  Smoking status  . Former Smoker  . Quit date: 04/10/1968  Smokeless tobacco  . Former Neurosurgeon  . Types: Chew    History  Alcohol Use No    Family History  Problem Relation Age of Onset  . Heart disease Father   . Hypertension Brother   . Stroke Brother     Review of Systems: The review of systems is positive for gangrene of his toes. No cardiac complaints.  All other systems were reviewed and are negative.  Physical Exam: BP 107/51  Pulse 79  Ht 6' (1.829 m)  Wt 239 lb (108.41 kg)  BMI 32.41 kg/m2  Patient is very pleasant and in no acute distress.  He has lost weight. Skin is warm and dry. Color is normal.  HEENT is unremarkable. Normocephalic/atraumatic. PERRL. Sclera are nonicteric. Neck is supple. No masses. No JVD. Lungs are fairly clear. Cardiac exam shows an irregular rhythm. Rate is controlled. Abdomen is obese but soft. Extremities are without edema. He has a functioning AV fistula in his right arm. Gait and ROM are intact. No gross neurologic deficits noted. I did not examine his feet.   LABORATORY DATA:  INR was 2.4 on June 4.  Assessment / Plan:

## 2011-06-20 NOTE — Assessment & Plan Note (Signed)
He is having no significant anginal symptoms. His last evaluation with cardiac catheterization 2005 showed his grafts were patent. We will continue with his medical therapy and risk factor modification.

## 2011-06-20 NOTE — Telephone Encounter (Signed)
Message copied by Velda Shell on Wed Jun 20, 2011  8:59 AM ------      Message from: [place], PETER M      Created: Tue Jun 19, 2011  5:36 PM       Yes no bridging needed. No hx of CVA      Peter [place] MD, [facility]                  ----- Message -----         From: Mariane Masters, [place]         Sent: 06/19/2011  11:56 AM           To: Peter M [place], MD            Pt is scheduled to have a abdominal aortagram by [name] on 6/19 and needs to hold Coumadin x 5 days.  Pt is on Coumadin for Afib.  Also on dialysis.  Okay to hold with no Lovenox?

## 2011-06-20 NOTE — Assessment & Plan Note (Signed)
He has moderate to severe aortic stenosis. He has normal LV function. He remains asymptomatic and will be a fairly poor candidate for valve surgery at this point. We will plan on repeating an echocardiogram in 6 months.

## 2011-06-20 NOTE — Telephone Encounter (Signed)
Spoke with pt's wife.  She is aware okay to hold Coumadin.  LM for Darel Hong at [facility] to relay information as well.

## 2011-06-20 NOTE — Patient Instructions (Addendum)
Continue your current medications but hold Coumadin as instructed prior to your procedure.  I will see you again in 6 months.  Your physician wants you to follow-up in: 6 months You will receive a reminder letter in the mail two months in advance. If you don't receive a letter, please call our office to schedule the follow-up appointment.

## 2011-06-20 NOTE — Assessment & Plan Note (Signed)
His volume status has improved significantly with dialysis. He appears to be euvolemic today.

## 2011-06-20 NOTE — Assessment & Plan Note (Signed)
He is cleared to proceed with his angiography and potential surgery from a cardiac standpoint. He will need to stop his Coumadin 5 days prior to procedure. I do not feel that he needs bridging heparin. Will be available if any cardiac issues arise.

## 2011-06-21 ENCOUNTER — Other Ambulatory Visit: Payer: Self-pay | Admitting: *Deleted

## 2011-06-21 ENCOUNTER — Inpatient Hospital Stay: Payer: Self-pay | Admitting: Internal Medicine

## 2011-06-21 ENCOUNTER — Ambulatory Visit: Payer: Self-pay | Admitting: Cardiovascular Disease

## 2011-06-21 DIAGNOSIS — Z7901 Long term (current) use of anticoagulants: Secondary | ICD-10-CM

## 2011-06-21 DIAGNOSIS — I4891 Unspecified atrial fibrillation: Secondary | ICD-10-CM

## 2011-06-25 ENCOUNTER — Inpatient Hospital Stay: Payer: Self-pay | Admitting: Internal Medicine

## 2011-06-27 ENCOUNTER — Encounter (HOSPITAL_COMMUNITY)
Admission: RE | Disposition: A | Discharge status: Home / Self Care | Payer: Self-pay | Source: Ambulatory Visit | Attending: Surgery

## 2011-06-27 ENCOUNTER — Ambulatory Visit (HOSPITAL_COMMUNITY)
Admission: RE | Admit: 2011-06-27 | Discharge: 2011-06-27 | Disposition: A | Discharge status: Home / Self Care | Payer: MEDICARE | Source: Ambulatory Visit | Attending: Surgery | Admitting: Surgery

## 2011-06-27 ENCOUNTER — Telehealth: Payer: Self-pay | Admitting: Surgery

## 2011-06-27 DIAGNOSIS — I120 Hypertensive chronic kidney disease with stage 5 chronic kidney disease or end stage renal disease: Secondary | ICD-10-CM | POA: Insufficient documentation

## 2011-06-27 DIAGNOSIS — L98499 Non-pressure chronic ulcer of skin of other sites with unspecified severity: Secondary | ICD-10-CM | POA: Insufficient documentation

## 2011-06-27 DIAGNOSIS — I739 Peripheral vascular disease, unspecified: Secondary | ICD-10-CM

## 2011-06-27 DIAGNOSIS — E1142 Type 2 diabetes mellitus with diabetic polyneuropathy: Secondary | ICD-10-CM | POA: Insufficient documentation

## 2011-06-27 DIAGNOSIS — Z992 Dependence on renal dialysis: Secondary | ICD-10-CM | POA: Insufficient documentation

## 2011-06-27 DIAGNOSIS — N186 End stage renal disease: Secondary | ICD-10-CM | POA: Insufficient documentation

## 2011-06-27 DIAGNOSIS — E1149 Type 2 diabetes mellitus with other diabetic neurological complication: Secondary | ICD-10-CM | POA: Insufficient documentation

## 2011-06-27 HISTORY — PX: ABDOMINAL AORTAGRAM: SHX5454

## 2011-06-27 LAB — POCT I-STAT, CHEM 8
Calcium, Ion: 1.06 mmol/L — ABNORMAL LOW (ref 1.12–1.32)
HCT: 34 % — ABNORMAL LOW (ref 39.0–52.0)
Hemoglobin: 11.6 g/dL — ABNORMAL LOW (ref 13.0–17.0)
Sodium: 138 mEq/L (ref 135–145)
TCO2: 28 mmol/L (ref 0–100)

## 2011-06-27 LAB — PROTIME-INR
INR: 1.23 (ref 0.00–1.49)
Prothrombin Time: 15.8 seconds — ABNORMAL HIGH (ref 11.6–15.2)

## 2011-06-27 LAB — GLUCOSE, CAPILLARY: Glucose-Capillary: 49 mg/dL — ABNORMAL LOW (ref 70–99)

## 2011-06-27 SURGERY — ABDOMINAL AORTAGRAM
Anesthesia: LOCAL

## 2011-06-27 MED ORDER — ONDANSETRON HCL 4 MG/2ML IJ SOLN: 4.0000 mg | Freq: Four times a day (QID) | INTRAMUSCULAR | Status: DC | PRN

## 2011-06-27 MED ORDER — DEXTROSE-NACL 5-0.45 % IV SOLN
INTRAVENOUS | Status: DC
Start: 2011-06-27 — End: 2011-06-27
  Administered 2011-06-27: 1000 mL via INTRAVENOUS

## 2011-06-27 MED ORDER — OXYCODONE HCL 5 MG PO TABS: 5.0000 mg | ORAL_TABLET | ORAL | Status: DC | PRN

## 2011-06-27 MED ORDER — ACETAMINOPHEN 325 MG PO TABS: 325.0000 mg | ORAL_TABLET | ORAL | Status: DC | PRN

## 2011-06-27 MED ORDER — MIDAZOLAM HCL 2 MG/2ML IJ SOLN
INTRAMUSCULAR | Status: AC
  Filled 2011-06-27: qty 2

## 2011-06-27 MED ORDER — FENTANYL CITRATE 0.05 MG/ML IJ SOLN
INTRAMUSCULAR | Status: AC
  Filled 2011-06-27: qty 2

## 2011-06-27 MED ORDER — SODIUM CHLORIDE 0.9 % IJ SOLN: 3.0000 mL | INTRAMUSCULAR | Status: DC | PRN

## 2011-06-27 MED ORDER — CLONIDINE HCL 0.2 MG PO TABS: 0.2000 mg | ORAL_TABLET | ORAL | Status: DC | PRN

## 2011-06-27 MED ORDER — LIDOCAINE HCL (PF) 1 % IJ SOLN
INTRAMUSCULAR | Status: AC
  Filled 2011-06-27: qty 30

## 2011-06-27 MED ORDER — METOPROLOL TARTRATE 1 MG/ML IV SOLN: 2.0000 mg | INTRAVENOUS | Status: DC | PRN

## 2011-06-27 MED ORDER — DEXTROSE 50 % IV SOLN
INTRAVENOUS | Status: AC
  Administered 2011-06-27: 25 mL via INTRAVENOUS
  Filled 2011-06-27: qty 50

## 2011-06-27 MED ORDER — DEXTROSE 50 % IV SOLN
25.0000 mL | Freq: Once | INTRAVENOUS | Status: AC | PRN
  Administered 2011-06-27: 25 mL via INTRAVENOUS

## 2011-06-27 MED ORDER — GUAIFENESIN-DM 100-10 MG/5ML PO SYRP: 15.0000 mL | ORAL_SOLUTION | ORAL | Status: DC | PRN

## 2011-06-27 MED ORDER — ACETAMINOPHEN 325 MG RE SUPP: 325.0000 mg | RECTAL | Status: DC | PRN

## 2011-06-27 MED ORDER — PHENOL 1.4 % MT LIQD: 1.0000 | OROMUCOSAL | Status: DC | PRN

## 2011-06-27 MED ORDER — MORPHINE SULFATE 10 MG/ML IJ SOLN: 2.0000 mg | INTRAMUSCULAR | Status: DC | PRN

## 2011-06-27 MED ORDER — HEPARIN (PORCINE) IN NACL 2-0.9 UNIT/ML-% IJ SOLN
INTRAMUSCULAR | Status: AC
  Filled 2011-06-27: qty 1000

## 2011-06-27 MED ORDER — ALUM & MAG HYDROXIDE-SIMETH 200-200-20 MG/5ML PO SUSP: 15.0000 mL | ORAL | Status: DC | PRN

## 2011-06-27 NOTE — H&P (View-Only) (Signed)
Vascular and Vein Specialist of [employer]   Patient name: Alan [name] MRN: [id number] DOB: [birth year] Sex: male   Referred by: Dr. Leslie Dales  Reason for referral:  Chief Complaint  Patient presents with  . PVD    eval heel ulcer/Dr. Briant Cedar    HISTORY OF PRESENT ILLNESS: The patient is referred today for left leg ulcerations and gangrene as well as a right third toe ulcer.  He also has a heel ulcer which she states has been present for greater than one month. He feels that the toes at change of the past several weeks. This does not appear to be causing him a significant amount of pain. He denies fevers or chills.  The patient does suffer from end-stage renal disease and is on dialysis. He has been seen in our office previously by Dr. Madilyn Fireman in 2008. At that time ABIs were 0.54 on the right and 0.46 on the left.  He suffers from coronary artery disease and is status post coronary artery bypass grafting in 1996. He is on Coumadin. He is a former smoker and quit in 1970. He is medically managed for his diabetes.  Past Medical History  Diagnosis Date  . Diabetes mellitus   . Gout   . Hypertension   . Hyperlipidemia   . Chronic kidney disease     Started dialysis October 2012  . Arthritis   . Prostate cancer     s/p seed implant  . Cataract     BILATERAL-BEEN REMOVED  . Neuromuscular disorder     DABETIC NEUROPATHY-LOWER EXTREMITY  . Atrial fibrillation     on coumadin  . Aortic stenosis     moderate to severe, not felt to be a surgical candidate  . Anemia     secondary to end stage renal disease  . Coronary artery disease     remote CABG in 1996; last stress test in 2005; last cath 2005  . Diabetic neuropathy   . Edema   . PAD (peripheral artery disease)   . Diabetic retinopathy   . Chronic anticoagulation     on coumadin  . Nephrolithiasis     prior history of percutaneous nephrostomy  . Shortness of breath     Past Surgical History  Procedure Date  .  Ventral hernia repair   . Inguinal hernia repair     right side  . Colonoscopy   . Kidney stone surgery   . Polypectomy   . Tonsillectomy   . Toe surgery     removal little toe right foot  . Cardiac catheterization 04/19/2003    SEVERE 2 VESSEL OBSTRUCTIVE ATHERSCLEROTIC CAD  . Coronary artery bypass graft 1996    LIMA GRAFT TO THE LAD, SEQUENTIAL SAPHENOUS  VEIN GRAFT TO THE FIRST AND SECOND DIAGONIAL BRANCHES, SEQUENTIAL SAPHENOUS VEIN GRAFT TO THE ACUTE MARGINAL, POSTERIOR DESCENDING, AND POSTERIOR LATERAL BRANCHES OF THE RIGHT CORONARY ARTERY  . Radioactive seed implant   . US echocardiography 09/06/2009    EF 55-60%  . Cardiovascular stress test 04/13/2003    EF 54%. EVIDENCE OF ANTERO-APICAL ISCHEMIA. NORMAL LV SIZE AND FUNCTION  . Tee without cardioversion 05/09/2011    Procedure: TRANSESOPHAGEAL ECHOCARDIOGRAM (TEE);  Surgeon: Laurey Morale, MD;  Location: [facility] ENDOSCOPY;  Service: Cardiovascular;  Laterality: N/A;    History   Social History  . Marital Status: Married    Spouse Name: N/A    Number of Children: N/A  . Years of Education: N/A   Occupational History  .  Not on file.   Social History Main Topics  . Smoking status: Former Smoker    Quit date: 04/10/1968  . Smokeless tobacco: Former Neurosurgeon    Types: Chew  . Alcohol Use: No  . Drug Use: No  . Sexually Active: No   Other Topics Concern  . Not on file   Social History Narrative  . No narrative on file    Family History  Problem Relation Age of Onset  . Heart disease Father   . Hypertension Brother   . Stroke Brother     Allergies as of 06/18/2011 - Review Complete 06/18/2011  Allergen Reaction Noted  . Ancef (cefazolin) Rash 05/18/2011    Current Outpatient Prescriptions on File Prior to Visit  Medication Sig Dispense Refill  . allopurinol (ZYLOPRIM) 300 MG tablet Take 150 mg by mouth daily.      [name] aspirin EC 81 MG tablet Take 81 mg by mouth daily.      . calcium carbonate (TUMS - DOSED IN MG  ELEMENTAL CALCIUM) 500 MG chewable tablet Chew 1 tablet by mouth 3 (three) times daily before meals. Phosphorous binder      . estazolam (PROSOM) 2 MG tablet Take 1 mg by mouth at bedtime as needed. For sleep      . ezetimibe-simvastatin (VYTORIN) 10-40 MG per tablet Take 1 tablet by mouth at bedtime.  30 tablet  0  . glimepiride (AMARYL) 4 MG tablet Take 2 mg by mouth daily before breakfast.        . metoprolol tartrate (LOPRESSOR) 25 MG tablet Take 50 mg by mouth 2 (two) times daily. Hold for systolic blood pressure(top number) less than 110 ,and heart rate less than 60, take 1/2 tablet by mouth bid      . traMADol (ULTRAM) 50 MG tablet Take 50 mg by mouth 2 (two) times daily as needed. pain      . valACYclovir (VALTREX) 500 MG tablet Take 1 tablet (500 mg total) by mouth daily. Take after dialysis on dialysis days  7 tablet  0  . warfarin (COUMADIN) 5 MG tablet Take 2.5-5 mg by mouth every evening. Takes 2.5mg  daily except 5mg  on Tuesday,  Thursday, and Sunday      . ceFAZolin (ANCEF) 2-3 GM-% SOLR Inject 50 mLs (2 g total) into the vein every Tuesday, Thursday, and Saturday at 6 PM. Last dose 6/13  18 each  0  . multivitamin (RENA-VIT) TABS tablet Take 1 tablet by mouth daily.        . Nutritional Supplements (FEEDING SUPPLEMENT, NEPRO CARB STEADY,) LIQD Take 237 mLs by mouth 3 (three) times daily as needed.         REVIEW OF SYSTEMS: Cardiovascular: No chest pain, chest pressure, palpitations, orthopnea, or dyspnea on exertion. No claudication or rest pain,  No history of DVT or phlebitis. Pulmonary: No productive cough, asthma or wheezing. Neurologic: No weakness, paresthesias, aphasia, or amaurosis. No dizziness. Hematologic: No bleeding problems or clotting disorders. On Coumadin Musculoskeletal: No joint pain or joint swelling. Gastrointestinal: No blood in stool or hematemesis Genitourinary: On dialysis Psychiatric:: No history of major depression. Integumentary: Please see history  of present illness and physical exam Constitutional: No fever or chills.  PHYSICAL EXAMINATION: General: The patient appears their stated age.  Vital signs are BP 140/66  Pulse 78  Temp(Src) 98 F (36.7 C) (Oral)  Ht 6' (1.829 m)  Wt 245 lb 9.6 oz (111.403 kg)  BMI 33.31 kg/m2  SpO2 97% HEENT:  No gross abnormalities Pulmonary: Respirations are non-labored Abdomen: Soft and non-tender  Musculoskeletal: There are no major deformities.   Neurologic: No focal weakness or paresthesias are detected, Skin: Dry gangrene noted to the left second through fifth toe. There is superficial ulceration of the right third toe. There is a large left heel ulcer. There is no evidence of gross infection. There is no foul odor and no drainage. Psychiatric: The patient has normal affect. Cardiovascular: There is a regular rate and rhythm without significant murmur appreciated. Pedal pulses cannot be palpated  Diagnostic Studies: Unable to obtain ankle-brachial indices today do to known noncompressibility. He has dampened digital waveforms and monophasic pedal waveforms. Digital pressures are 64 the right is 67 normal left  Outside Studies/Documentation Historical records were reviewed.  They showed bilateral ulcerations left greater than right  Medication Changes: I will contact Dr. Elvis Coil office for recommendations regarding stopping his Coumadin for arteriogram  Assessment:  Bilateral ulcerations left greater than right with dry gangrene to the left second through fourth toes and a left heel ulcer. There is a right third toe superficial ulceration Plan: I discussed with the patient and his wife the significance of the physical exam findings today. I think that he is at very high risk for limb loss, certainly on the left leg and potentially on the right. At minimum he is going to lose 4 toes on the left. I'm unsure that he can heal this amputation and therefore he needs further interrogation of his  lower extremity arterial system. I recommended proceeding with angiography. Our plan on accessing the right groin insetting both legs. I'll intervene if possible. This is been scheduled for Wednesday, June 19. He'll need to come off of his Coumadin 5 days prior. His cardiologist, Dr. [place]az[place]and will assist and recommendations as to whether or not he needs to be bridged while off of his Coumadin. I again stressed that this is a limb threatening situation. I told him about everything I could to save as much of his leg is possible that this could ultimately lead to an above-knee amputation.     Jorge Ny, M.D. [facility] [place] Office: [phone] Pager:  [phone]

## 2011-06-27 NOTE — Telephone Encounter (Signed)
Message copied by Fredrich Birks on Wed Jun 27, 2011  3:19 PM ------      Message from: Melene Plan      Created: Wed Jun 27, 2011  2:21 PM                   ----- Message -----         From: Nada Libman, MD         Sent: 06/27/2011  12:28 PM           To: Reuel Derby, Melene Plan, RN            06/27/2011             1.  ultrasound access right femoral artery       2.  abdominal aortogram       3.  bilateral lower extremity runoff       4.  second order catheterization            Please schedule the patient to come back to see me in the office in 2 weeks to discuss amputation

## 2011-06-27 NOTE — Discharge Instructions (Signed)

## 2011-06-27 NOTE — Op Note (Signed)
[facility]ascular and Vein Specialists of [facility]  Patient name: Alan [name] MRN: [id number] DOB: 08-09-1931 Sex: male  06/27/2011 Pre-operative Diagnosis: Bilateral lower extremity ulcers Post-operative diagnosis:  Same Surgeon:  Jorge Ny Procedure Performed:  1.  ultrasound access right femoral artery  2.  abdominal aortogram  3.  bilateral lower extremity runoff  4.  second order catheterization   Indications:  The patient presented to the office last week with bilateral lower extremity ischemia, left greater than right. Duplex revealed severe arterial insufficiency. He comes in today for further evaluation and possible intervention.  Procedure:  The patient was identified in the holding area and taken to room 8.  The patient was then placed supine on the table and prepped and draped in the usual sterile fashion.  A time out was called.  Ultrasound was used to evaluate the right common femoral artery.  It was patent .  A digital ultrasound image was acquired. The right common femoral artery was accessed under ultrasound guidance. An 035 wire was advanced without resistance and a 5 French sheath was placed.   An omniflush catheter was advanced over the wire to the level of L-1.  An abdominal angiogram was obtained.  Next, using the omniflush catheter and a benson wire, the aortic bifurcation was crossed and the catheter was placed into theleft external iliac artery and left runoff was obtained.  right runoff was performed via retrograde sheath injections.  Findings:   Aortogram:  The visualized portions of the suprarenal abdominal aorta showed no significant stenosis. There is no evidence of renal artery stenosis. The infrarenal abdominal aorta is widely patent without stenosis. Bilateral common external and internal iliac arteries are widely patent.  Right Lower Extremity:  The right common femoral artery is widely patent. The right profunda femoral artery is widely patent. The  right superficial femoral artery is patent throughout it's course. The right popliteal artery is patent throughout it's course. There is occlusion of all 3 tibial vessels with reconstitution of the peroneal artery which then occludes again. Mainly collaterals was visualized within the right leg.  Left Lower Extremity:  The left common femoral artery is widely patent. The left profunda femoral artery is widely patent. The left superficial femoral artery is widely patent. The left popliteal artery is patent throughout it's course. There is occlusion of all 3 tibial vessels. There is delayed reconstitution of the posterior tibial and peroneal artery however he also reoccluded at the ankle.  Intervention:  None performed today  Impression:  #1  no significant stenosis within the aortoiliac system or the femoral-popliteal system bilaterally  #2  severe tibial disease which is not reconstructable on the left.  #3  an attempt at opening his) renal artery could be done for limb salvage however the likelihood of a durable outcome is not very high due to his distal disease.   Juleen China, M.D. [facility] [place] Office: [phone] Pager:  [phone]

## 2011-06-27 NOTE — Interval H&P Note (Signed)
History and Physical Interval Note:  06/27/2011 8:34 AM  Alan [name]  has presented today for surgery, with the diagnosis of pvd  The various methods of treatment have been discussed with the patient and family. After consideration of risks, benefits and other options for treatment, the patient has consented to  Procedure(s) (LRB): ABDOMINAL AORTAGRAM (N/A) as a surgical intervention .  The patient's history has been reviewed, patient examined, no change in status, stable for surgery.  I have reviewed the patients' chart and labs.  Questions were answered to the patient's satisfaction.     [name] IV, V. WELLS

## 2011-06-27 NOTE — Telephone Encounter (Signed)
Left message with pts son regarding appointment and sent letter also, dpm

## 2011-06-29 ENCOUNTER — Telehealth: Payer: Self-pay | Admitting: Cardiology

## 2011-06-29 NOTE — Telephone Encounter (Signed)
Spoke with pt.  He is aware to go ahead and start Coumadin today.  Had angiogram on 6/19

## 2011-06-29 NOTE — Telephone Encounter (Signed)
New Problem:    Patient's wife called in wanting to know when he is supposed to start his coumadin again.  Please call back.

## 2011-07-09 ENCOUNTER — Other Ambulatory Visit: Payer: Self-pay | Admitting: *Deleted

## 2011-07-09 MED ORDER — WARFARIN SODIUM 5 MG PO TABS: 5.0000 mg | ORAL_TABLET | ORAL | Status: DC

## 2011-07-13 ENCOUNTER — Encounter: Payer: Self-pay | Admitting: Surgery

## 2011-07-13 ENCOUNTER — Ambulatory Visit: Payer: Self-pay | Admitting: Pharmacist

## 2011-07-13 DIAGNOSIS — I4891 Unspecified atrial fibrillation: Secondary | ICD-10-CM

## 2011-07-13 DIAGNOSIS — Z7901 Long term (current) use of anticoagulants: Secondary | ICD-10-CM

## 2011-07-16 ENCOUNTER — Encounter: Payer: Self-pay | Admitting: Surgery

## 2011-07-16 ENCOUNTER — Encounter: Payer: Self-pay | Admitting: Cardiology

## 2011-07-16 ENCOUNTER — Ambulatory Visit (INDEPENDENT_AMBULATORY_CARE_PROVIDER_SITE_OTHER): Payer: Medicare Other | Admitting: Surgery

## 2011-07-16 VITALS — BP 119/51 | HR 73 | Temp 98.0°F | Ht 73.0 in | Wt 243.0 lb

## 2011-07-16 DIAGNOSIS — L98499 Non-pressure chronic ulcer of skin of other sites with unspecified severity: Secondary | ICD-10-CM

## 2011-07-16 DIAGNOSIS — I739 Peripheral vascular disease, unspecified: Secondary | ICD-10-CM

## 2011-07-16 NOTE — Telephone Encounter (Signed)
error 

## 2011-07-16 NOTE — Progress Notes (Signed)
Vascular and Vein Specialist of [place]   Patient name: Alan [name] MRN: [id number] DOB: 09/20/1931 Sex: male     Chief Complaint  Patient presents with  . PVD    2 week f/u to discuss amputation - HD TTS    HISTORY OF PRESENT ILLNESS: The patient is back for followup of his bilateral lower extremity ulcers. He recently underwent angiography. He was found to have non-reconstructable disease on the left.  Past Medical History  Diagnosis Date  . Diabetes mellitus   . Gout   . Hypertension   . Hyperlipidemia   . Chronic kidney disease     Started dialysis October 2012  . Arthritis   . Prostate cancer     s/p seed implant  . Cataract     BILATERAL-BEEN REMOVED  . Neuromuscular disorder     DABETIC NEUROPATHY-LOWER EXTREMITY  . Atrial fibrillation     on coumadin  . Aortic stenosis     moderate to severe, not felt to be a surgical candidate  . Anemia     secondary to end stage renal disease  . Coronary artery disease     remote CABG in 1996; last stress test in 2005; last cath 2005  . Diabetic neuropathy   . Edema   . PAD (peripheral artery disease)   . Diabetic retinopathy   . Chronic anticoagulation     on coumadin  . Nephrolithiasis     prior history of percutaneous nephrostomy  . Shortness of breath     Past Surgical History  Procedure Date  . Ventral hernia repair   . Inguinal hernia repair     right side  . Colonoscopy   . Kidney stone surgery   . Polypectomy   . Tonsillectomy   . Toe surgery     removal little toe right foot  . Cardiac catheterization 04/19/2003    SEVERE 2 VESSEL OBSTRUCTIVE ATHERSCLEROTIC CAD  . Coronary artery bypass graft 1996    LIMA GRAFT TO THE LAD, SEQUENTIAL SAPHENOUS  VEIN GRAFT TO THE FIRST AND SECOND DIAGONIAL BRANCHES, SEQUENTIAL SAPHENOUS VEIN GRAFT TO THE ACUTE MARGINAL, POSTERIOR DESCENDING, AND POSTERIOR LATERAL BRANCHES OF THE RIGHT CORONARY ARTERY  . Radioactive seed implant   . Us echocardiography  09/06/2009    EF 55-60%  . Cardiovascular stress test 04/13/2003    EF 54%. EVIDENCE OF ANTERO-APICAL ISCHEMIA. NORMAL LV SIZE AND FUNCTION  . Tee without cardioversion 05/09/2011    Procedure: TRANSESOPHAGEAL ECHOCARDIOGRAM (TEE);  Surgeon: Dalton S McLean, MD;  Location: [facility] ENDOSCOPY;  Service: Cardiovascular;  Laterality: N/A;    History   Social History  . Marital Status: Married    Spouse Name: N/A    Number of Children: N/A  . Years of Education: N/A   Occupational History  . Not on file.   Social History Main Topics  . Smoking status: Former Smoker    Quit date: 04/10/1968  . Smokeless tobacco: Former User    Types: Chew  . Alcohol Use: No  . Drug Use: No  . Sexually Active: No   Other Topics Concern  . Not on file   Social History Narrative  . No narrative on file    Family History  Problem Relation Age of Onset  . Heart disease Father   . Hypertension Brother   . Stroke Brother     Allergies as of 07/16/2011 - Review Complete 07/16/2011  Allergen Reaction Noted  . Ancef (cefazolin) Rash 05/18/2011      Current Outpatient Prescriptions on File Prior to Visit  Medication Sig Dispense Refill  . allopurinol (ZYLOPRIM) 300 MG tablet Take 150 mg by mouth daily.      . aspirin EC 81 MG tablet Take 81 mg by mouth daily.      . calcium carbonate (TUMS - DOSED IN MG ELEMENTAL CALCIUM) 500 MG chewable tablet Chew 1 tablet by mouth 3 (three) times daily before meals. Phosphorous binder      . estazolam (PROSOM) 2 MG tablet Take 1 mg by mouth at bedtime as needed. For sleep      . ezetimibe-simvastatin (VYTORIN) 10-40 MG per tablet Take 1 tablet by mouth at bedtime.  30 tablet  0  . glimepiride (AMARYL) 4 MG tablet Take 2 mg by mouth daily before breakfast.        . HYDROcodone-acetaminophen (VICODIN) 5-500 MG per tablet Take 1 tablet by mouth every 6 (six) hours as needed. For pain      . metoprolol tartrate (LOPRESSOR) 25 MG tablet Take 37.5 mg by mouth 2 (two) times  daily. Hold for systolic blood pressure(top number) less than 110 ,and heart rate less than 60, take 1/2 tablet by mouth bid      . Nutritional Supplements (FEEDING SUPPLEMENT, NEPRO CARB STEADY,) LIQD Take 237 mLs by mouth 3 (three) times daily as needed.      . silver sulfADIAZINE (SILVADENE) 1 % cream Apply 1 application topically daily.      . traMADol (ULTRAM) 50 MG tablet Take 50 mg by mouth 2 (two) times daily as needed. pain      . VANCOMYCIN HCL IV Inject into the vein. Tuesday, Thursday, Saturday      . warfarin (COUMADIN) 5 MG tablet Take 1 tablet (5 mg total) by mouth as directed.  30 tablet  3     REVIEW OF SYSTEMS: No change from prior  PHYSICAL EXAMINATION:   Vital signs are BP 119/51  Pulse 73  Temp 98 F (36.7 C) (Oral)  Ht 6' 1" (1.854 m)  Wt 243 lb (110.224 kg)  BMI 32.06 kg/m2  SpO2 100% General: The patient appears their stated age. HEENT:  No gross abnormalities Pulmonary:  Non labored breathing Abdomen: Soft and non-tender Musculoskeletal: There are no major deformities. Neurologic: No focal weakness or paresthesias are detected, Skin: Right lateral malleolar wall serve and chronic ulcer to third toe. Gangrenous changes to the left toes and left heel Psychiatric: The patient has normal affect. Cardiovascular: There is a regular rate and rhythm without significant murmur appreciated.   Diagnostic Studies None  Assessment: Ischemic left leg Plan: By angiogram he does not have options for revascularization. I do not believe that his left leg is salvageable based on the extent of tissue loss on the foot as well as on the heel. I feel his best option is amputation. We discussed below knee versus above-knee amputation. We have decided to proceed with a left below-knee amputation. He understands the risks and benefits including the possibility to convert this to an above-knee amputation for nonhealing. We have scheduled this operation for this Friday, July 12. He  will discontinue his Coumadin today.  V. Wells Sharnette Kitamura IV, M.D. [facility] [place] Office: [phone] Pager:  [phone]    

## 2011-07-17 ENCOUNTER — Encounter (HOSPITAL_COMMUNITY): Payer: Self-pay | Admitting: Pharmacy Technician

## 2011-07-17 ENCOUNTER — Other Ambulatory Visit: Payer: Self-pay

## 2011-07-17 ENCOUNTER — Telehealth: Payer: Self-pay | Admitting: Cardiology

## 2011-07-17 NOTE — Telephone Encounter (Signed)
[facility] called to report patient is having left foot amputated this Friday 07/20/11.Patient told them he started holding coumadin yesterday 07/16/11.[facility] wanted to make Dr.Jordan aware.Also wanted to know will patient need Lovenox injections post op. Spoke to patient's wife.She stated Dr.Brabham will be doing the surgery.Message fowarded to Dr.Jordan for his review.

## 2011-07-17 NOTE — Telephone Encounter (Signed)
New Problem:    Called in wanting to make you aware that the patient has a surgical procedure (amputation) scheduled for this Friday and the surgeon has ordered him to hold his coumadin for this week.  Wanted to know if he needed Lovenox shots to avoid post surgical clots. Please call back.

## 2011-07-18 ENCOUNTER — Encounter (HOSPITAL_COMMUNITY)
Admission: RE | Admit: 2011-07-18 | Discharge: 2011-07-18 | Disposition: A | Discharge status: Home / Self Care | Payer: MEDICARE | Source: Ambulatory Visit | Attending: Surgery | Admitting: Surgery

## 2011-07-18 ENCOUNTER — Encounter (HOSPITAL_COMMUNITY)
Admission: RE | Admit: 2011-07-18 | Discharge: 2011-07-18 | Disposition: A | Discharge status: Home / Self Care | Payer: MEDICARE | Source: Ambulatory Visit | Attending: Anesthesiology | Admitting: Anesthesiology

## 2011-07-18 ENCOUNTER — Encounter (HOSPITAL_COMMUNITY): Payer: Self-pay

## 2011-07-18 LAB — APTT: aPTT: 35 seconds (ref 24–37)

## 2011-07-18 NOTE — Pre-Procedure Instructions (Signed)
20 Alan [name]  07/18/2011   Your procedure is scheduled on:  Friday, July 12th. Report to [facility] at 8:30AM      Call this number if you have problems the morning of surgery: [phone]   Remember:  Do not eat food or drink any liquids:After Midnight.      Take these medicines the morning of surgery with A SIP OF WATER: Metoprolol (Lopressor).  May take Tramadol (Ultram) if needed.     Do not wear jewelry, make-up or nail polish.  Do not wear lotions, powders, or perfumes. You may wear deodorant.  Do not shave 48 hours prior to surgery. Men may shave face and neck.  Do not bring valuables to the hospital.  Contacts, dentures or bridgework may not be worn into surgery.  Leave suitcase in the car. After surgery it may be brought to your room.  For patients admitted to the hospital, checkout time is 11:00 AM the day of discharge.   Patients discharged the day of surgery will not be allowed to drive home.  Name and phone number of your driver: NA  Special Instructions: CHG Shower Use Special Wash: 1/2 bottle night before surgery and 1/2 bottle morning of surgery.   Please read over the following fact sheets that you were given: Pain Booklet, Coughing and Deep Breathing and Surgical Site Infection Prevention

## 2011-07-19 MED ORDER — VANCOMYCIN HCL 1000 MG IV SOLR
1500.0000 mg | INTRAVENOUS | Status: AC
  Administered 2011-07-20: 1500 mg via INTRAVENOUS
  Filled 2011-07-19: qty 1500

## 2011-07-19 NOTE — Consult Note (Signed)
Anesthesia Chart Review:  Patient is a 78 year old male scheduled for a left BKA on 07/20/11 by [name].  History includes former smoker, DM2 with neuropathy, HTN, CKD on hemodialysis, prostate CA s/p radioactive seed implant, CAD s/p CABG '96 (LIMA to LAD, sequential SVG to D1 and D2, sequential SVG to PD and PL), moderate to severe AS, PAD, afib, HLD, gout.  Dr. [place] is his Cardiologist.  He was last seen on 06/20/11.  By notes, he was cleared for angiography and potential surgery.  Heparin bridging was not felt necessary once Coumadin was held.  Currently he is felt to be a fairly poor candidate for cardiac (valvular) surgery.  EKG on 05/05/11 showed a-flutter, low voltage QRS, septal infarct (age undetermined), possible inferior infarct (age undetermined), prolonged QT.  TEE on 05/09/11 showed: - Left ventricle: The cavity size was normal. Wall thickness was increased in a pattern of mild LVH. Systolic function was normal. The estimated ejection fraction was in the range of 55% to 60%. Wall motion was normal; there were no regional wall motion abnormalities. - Aortic valve: Trileaflet; severely calcified leaflets. Difficult to comment on presence or absence of vegetation given heavy calcification, but suspect no vegetation is present. There was severe aortic stenosis. Mean gradient:23mm Hg (S). Peak gradient: 63mm Hg (S). Valve area: 0.5cm^2(VTI).  - Aorta: Normal caliber aorta, grade III plaque in arch and descending thoracic aorta. - Mitral valve: Mildly to moderately calcified annulus. Mildly calcified leaflets . No evidence of vegetation. Trivial regurgitation. - Left atrium: The atrium was mildly dilated. No evidence of thrombus in the atrial cavity or appendage. - Right atrium: No evidence of thrombus in the atrial cavity or appendage. - Atrial septum: No defect or patent foramen ovale was identified. Echo contrast study showed no right-to-left atrial level shunt, at baseline or  with provocation. - Tricuspid valve: No evidence of vegetation. Peak RV-RA gradient: 27mm Hg (S). - Pulmonic valve: No evidence of vegetation.  2D echo on 05/04/11 showed: - Left ventricle: The cavity size was normal. Wall thickness was increased in a pattern of mild LVH. - Aortic valve: AV is thickened, calcified with restricted motion. Peak and mean gradients through the valve are 46 and 28 mm Hg respectively consistent with moderate AS. Valve area: 0.49cm^2(VTI). Valve area: 0.53cm^2 (Vmax). - Right atrium: The atrium was mildly dilated. - Pulmonary arteries: PA peak pressure: 40mm Hg (S).  Cardiac cath on 05/26/10 showed: 1. Severe two-vessel obstructive atherosclerotic coronary artery disease.  2. Continued graft patency including saphenous vein graft to the first diagonal, saphenous vein graft sequentially to [facility] marginal, posterior descending artery and posterior lateral branches of the right coronary artery and patent left internal mammary artery graft to the left anterior descending.  3. Normal left ventricular function.  PLAN: Would recommend continued medical therapy.   CXR from 07/18/11 showed: The lungs are clear without focal infiltrate, edema,  pneumothorax or pleural effusion. The cardiopericardial silhouette is enlarged. The patient is status post CABG Imaged bony structures of the thorax are intact.  PT/PTT noted.  He will need a repeat PT/INR as well as a CBC and BMET on arrival.  Shonna Chock, PA-C

## 2011-07-20 ENCOUNTER — Encounter (HOSPITAL_COMMUNITY): Payer: Self-pay | Admitting: Vascular Surgery

## 2011-07-20 ENCOUNTER — Inpatient Hospital Stay (HOSPITAL_COMMUNITY)
Admission: RE | Admit: 2011-07-20 | Discharge: 2011-07-24 | DRG: 239 | Disposition: A | Discharge status: Rehabilitation Facility | Payer: MEDICARE | Source: Ambulatory Visit | Attending: Surgery | Admitting: Surgery

## 2011-07-20 ENCOUNTER — Inpatient Hospital Stay (HOSPITAL_COMMUNITY): Payer: MEDICARE | Admitting: Vascular Surgery

## 2011-07-20 ENCOUNTER — Encounter (HOSPITAL_COMMUNITY)
Admission: RE | Disposition: A | Discharge status: Rehabilitation Facility | Payer: Self-pay | Source: Ambulatory Visit | Attending: Surgery

## 2011-07-20 DIAGNOSIS — I4891 Unspecified atrial fibrillation: Secondary | ICD-10-CM | POA: Diagnosis present

## 2011-07-20 DIAGNOSIS — M129 Arthropathy, unspecified: Secondary | ICD-10-CM | POA: Diagnosis present

## 2011-07-20 DIAGNOSIS — I2510 Atherosclerotic heart disease of native coronary artery without angina pectoris: Secondary | ICD-10-CM | POA: Diagnosis present

## 2011-07-20 DIAGNOSIS — D62 Acute posthemorrhagic anemia: Secondary | ICD-10-CM | POA: Diagnosis not present

## 2011-07-20 DIAGNOSIS — N186 End stage renal disease: Secondary | ICD-10-CM | POA: Diagnosis present

## 2011-07-20 DIAGNOSIS — Z992 Dependence on renal dialysis: Secondary | ICD-10-CM

## 2011-07-20 DIAGNOSIS — L98499 Non-pressure chronic ulcer of skin of other sites with unspecified severity: Secondary | ICD-10-CM

## 2011-07-20 DIAGNOSIS — Z87891 Personal history of nicotine dependence: Secondary | ICD-10-CM

## 2011-07-20 DIAGNOSIS — I359 Nonrheumatic aortic valve disorder, unspecified: Secondary | ICD-10-CM | POA: Diagnosis present

## 2011-07-20 DIAGNOSIS — E11319 Type 2 diabetes mellitus with unspecified diabetic retinopathy without macular edema: Secondary | ICD-10-CM | POA: Diagnosis present

## 2011-07-20 DIAGNOSIS — Z7901 Long term (current) use of anticoagulants: Secondary | ICD-10-CM

## 2011-07-20 DIAGNOSIS — Z01812 Encounter for preprocedural laboratory examination: Secondary | ICD-10-CM

## 2011-07-20 DIAGNOSIS — E1159 Type 2 diabetes mellitus with other circulatory complications: Principal | ICD-10-CM | POA: Diagnosis present

## 2011-07-20 DIAGNOSIS — E1139 Type 2 diabetes mellitus with other diabetic ophthalmic complication: Secondary | ICD-10-CM | POA: Diagnosis present

## 2011-07-20 DIAGNOSIS — E785 Hyperlipidemia, unspecified: Secondary | ICD-10-CM | POA: Diagnosis present

## 2011-07-20 DIAGNOSIS — D631 Anemia in chronic kidney disease: Secondary | ICD-10-CM | POA: Diagnosis present

## 2011-07-20 DIAGNOSIS — I70269 Atherosclerosis of native arteries of extremities with gangrene, unspecified extremity: Secondary | ICD-10-CM | POA: Diagnosis present

## 2011-07-20 DIAGNOSIS — Z951 Presence of aortocoronary bypass graft: Secondary | ICD-10-CM

## 2011-07-20 DIAGNOSIS — M109 Gout, unspecified: Secondary | ICD-10-CM | POA: Diagnosis present

## 2011-07-20 DIAGNOSIS — N039 Chronic nephritic syndrome with unspecified morphologic changes: Secondary | ICD-10-CM | POA: Diagnosis present

## 2011-07-20 DIAGNOSIS — I120 Hypertensive chronic kidney disease with stage 5 chronic kidney disease or end stage renal disease: Secondary | ICD-10-CM | POA: Diagnosis present

## 2011-07-20 HISTORY — PX: AMPUTATION: SHX166

## 2011-07-20 LAB — CBC
HCT: 39.3 % (ref 39.0–52.0)
MCH: 29.1 pg (ref 26.0–34.0)
MCH: 29.3 pg (ref 26.0–34.0)
MCHC: 31.7 g/dL (ref 30.0–36.0)
MCV: 92.9 fL (ref 78.0–100.0)
MCV: 92.4 fL (ref 78.0–100.0)
Platelets: 241 10*3/uL (ref 150–400)
Platelets: 255 10*3/uL (ref 150–400)
RBC: 4.23 MIL/uL (ref 4.22–5.81)
WBC: 11.6 10*3/uL — ABNORMAL HIGH (ref 4.0–10.5)

## 2011-07-20 LAB — GLUCOSE, CAPILLARY
Glucose-Capillary: 91 mg/dL (ref 70–99)
Glucose-Capillary: 82 mg/dL (ref 70–99)
Glucose-Capillary: 213 mg/dL — ABNORMAL HIGH (ref 70–99)

## 2011-07-20 LAB — BASIC METABOLIC PANEL
BUN: 29 mg/dL — ABNORMAL HIGH (ref 6–23)
CO2: 33 mEq/L — ABNORMAL HIGH (ref 19–32)
Calcium: 9.6 mg/dL (ref 8.4–10.5)
Chloride: 93 mEq/L — ABNORMAL LOW (ref 96–112)
Creatinine, Ser: 6.1 mg/dL — ABNORMAL HIGH (ref 0.50–1.35)
Glucose, Bld: 110 mg/dL — ABNORMAL HIGH (ref 70–99)

## 2011-07-20 LAB — CREATININE, SERUM: Creatinine, Ser: 6.5 mg/dL — ABNORMAL HIGH (ref 0.50–1.35)

## 2011-07-20 SURGERY — AMPUTATION BELOW KNEE
Anesthesia: General | Site: Leg Lower | Laterality: Left | Wound class: Clean

## 2011-07-20 MED ORDER — LIDOCAINE-PRILOCAINE 2.5-2.5 % EX CREA
1.0000 "application " | TOPICAL_CREAM | CUTANEOUS | Status: DC | PRN
  Filled 2011-07-20: qty 5

## 2011-07-20 MED ORDER — HYDRALAZINE HCL 20 MG/ML IJ SOLN
10.0000 mg | INTRAMUSCULAR | Status: DC | PRN
  Filled 2011-07-20: qty 0.5

## 2011-07-20 MED ORDER — TEMAZEPAM 15 MG PO CAPS: 15.0000 mg | ORAL_CAPSULE | Freq: Every evening | ORAL | Status: DC | PRN

## 2011-07-20 MED ORDER — VANCOMYCIN HCL IN DEXTROSE 1-5 GM/200ML-% IV SOLN
1000.0000 mg | Freq: Two times a day (BID) | INTRAVENOUS | Status: DC
  Filled 2011-07-20: qty 200

## 2011-07-20 MED ORDER — ALUM & MAG HYDROXIDE-SIMETH 200-200-20 MG/5ML PO SUSP: 15.0000 mL | ORAL | Status: DC | PRN

## 2011-07-20 MED ORDER — TRAMADOL HCL 50 MG PO TABS: 50.0000 mg | ORAL_TABLET | Freq: Two times a day (BID) | ORAL | Status: DC | PRN

## 2011-07-20 MED ORDER — SODIUM CHLORIDE 0.9 % IV SOLN
INTRAVENOUS | Status: DC | PRN
  Administered 2011-07-20: 15:00:00 via INTRAVENOUS

## 2011-07-20 MED ORDER — PANTOPRAZOLE SODIUM 40 MG PO TBEC
40.0000 mg | DELAYED_RELEASE_TABLET | Freq: Every day | ORAL | Status: DC
  Administered 2011-07-20 – 2011-07-24 (×5): 40 mg via ORAL
  Filled 2011-07-20 (×3): qty 1

## 2011-07-20 MED ORDER — PHENOL 1.4 % MT LIQD
1.0000 | OROMUCOSAL | Status: DC | PRN
  Filled 2011-07-20: qty 177

## 2011-07-20 MED ORDER — LIDOCAINE HCL (CARDIAC) 20 MG/ML IV SOLN
INTRAVENOUS | Status: DC | PRN
  Administered 2011-07-20 (×2): 50 mg via INTRAVENOUS

## 2011-07-20 MED ORDER — NEPRO/CARBSTEADY PO LIQD
237.0000 mL | Freq: Three times a day (TID) | ORAL | Status: DC | PRN
  Filled 2011-07-20: qty 237

## 2011-07-20 MED ORDER — ALLOPURINOL 150 MG HALF TABLET
150.0000 mg | ORAL_TABLET | Freq: Every day | ORAL | Status: DC
  Administered 2011-07-20 – 2011-07-24 (×4): 150 mg via ORAL
  Filled 2011-07-20 (×5): qty 1

## 2011-07-20 MED ORDER — POTASSIUM CHLORIDE IN NACL 20-0.9 MEQ/L-% IV SOLN
INTRAVENOUS | Status: DC
  Administered 2011-07-20: 21:00:00 via INTRAVENOUS
  Filled 2011-07-20 (×2): qty 1000

## 2011-07-20 MED ORDER — SODIUM CHLORIDE 0.9 % IV SOLN
INTRAVENOUS | Status: AC
  Administered 2011-07-20: 22:00:00 via INTRAVENOUS

## 2011-07-20 MED ORDER — MIDAZOLAM HCL 5 MG/5ML IJ SOLN
INTRAMUSCULAR | Status: DC | PRN
  Administered 2011-07-20: 2 mg via INTRAVENOUS

## 2011-07-20 MED ORDER — ALTEPLASE 2 MG IJ SOLR
2.0000 mg | Freq: Once | INTRAMUSCULAR | Status: AC | PRN
  Filled 2011-07-20: qty 2

## 2011-07-20 MED ORDER — ETOMIDATE 2 MG/ML IV SOLN
INTRAVENOUS | Status: DC | PRN
  Administered 2011-07-20: 20 mg via INTRAVENOUS

## 2011-07-20 MED ORDER — SODIUM CHLORIDE 0.9 % IV SOLN: 100.0000 mL | INTRAVENOUS | Status: DC | PRN

## 2011-07-20 MED ORDER — DOCUSATE SODIUM 100 MG PO CAPS
100.0000 mg | ORAL_CAPSULE | Freq: Every day | ORAL | Status: DC
  Administered 2011-07-21 – 2011-07-24 (×4): 100 mg via ORAL
  Filled 2011-07-20: qty 2
  Filled 2011-07-20 (×4): qty 1

## 2011-07-20 MED ORDER — SILVER SULFADIAZINE 1 % EX CREA
1.0000 "application " | TOPICAL_CREAM | Freq: Every day | CUTANEOUS | Status: DC
  Administered 2011-07-21 – 2011-07-24 (×4): 1 via TOPICAL
  Filled 2011-07-20: qty 85

## 2011-07-20 MED ORDER — NEPRO/CARBSTEADY PO LIQD
237.0000 mL | ORAL | Status: DC | PRN
  Filled 2011-07-20: qty 237

## 2011-07-20 MED ORDER — PENTAFLUOROPROP-TETRAFLUOROETH EX AERO: 1.0000 "application " | INHALATION_SPRAY | CUTANEOUS | Status: DC | PRN

## 2011-07-20 MED ORDER — ONDANSETRON HCL 4 MG/2ML IJ SOLN: 4.0000 mg | Freq: Four times a day (QID) | INTRAMUSCULAR | Status: DC | PRN

## 2011-07-20 MED ORDER — ENOXAPARIN SODIUM 30 MG/0.3ML ~~LOC~~ SOLN
30.0000 mg | SUBCUTANEOUS | Status: DC
  Administered 2011-07-21 – 2011-07-24 (×4): 30 mg via SUBCUTANEOUS
  Filled 2011-07-20 (×4): qty 0.3

## 2011-07-20 MED ORDER — SODIUM CHLORIDE 0.9 % IV SOLN: INTRAVENOUS | Status: DC

## 2011-07-20 MED ORDER — INSULIN ASPART 100 UNIT/ML ~~LOC~~ SOLN
0.0000 [IU] | Freq: Three times a day (TID) | SUBCUTANEOUS | Status: DC
  Administered 2011-07-21 – 2011-07-22 (×3): 3 [IU] via SUBCUTANEOUS
  Administered 2011-07-22 – 2011-07-23 (×2): 2 [IU] via SUBCUTANEOUS
  Administered 2011-07-23: 5 [IU] via SUBCUTANEOUS

## 2011-07-20 MED ORDER — ONDANSETRON HCL 4 MG/2ML IJ SOLN
INTRAMUSCULAR | Status: DC | PRN
  Administered 2011-07-20: 4 mg via INTRAVENOUS

## 2011-07-20 MED ORDER — HEPARIN SODIUM (PORCINE) 1000 UNIT/ML DIALYSIS
1000.0000 [IU] | INTRAMUSCULAR | Status: DC | PRN
  Filled 2011-07-20: qty 1

## 2011-07-20 MED ORDER — EZETIMIBE-SIMVASTATIN 10-40 MG PO TABS
1.0000 | ORAL_TABLET | Freq: Every day | ORAL | Status: DC
  Administered 2011-07-20 – 2011-07-23 (×4): 1 via ORAL
  Filled 2011-07-20 (×5): qty 1

## 2011-07-20 MED ORDER — METOPROLOL TARTRATE 1 MG/ML IV SOLN: 2.0000 mg | INTRAVENOUS | Status: DC | PRN

## 2011-07-20 MED ORDER — CALCIUM CARBONATE ANTACID 500 MG PO CHEW
1.0000 | CHEWABLE_TABLET | Freq: Three times a day (TID) | ORAL | Status: DC
  Administered 2011-07-21 – 2011-07-24 (×8): 200 mg via ORAL
  Filled 2011-07-20 (×13): qty 1

## 2011-07-20 MED ORDER — LIDOCAINE HCL (PF) 1 % IJ SOLN: 5.0000 mL | INTRAMUSCULAR | Status: DC | PRN

## 2011-07-20 MED ORDER — HYDROCODONE-ACETAMINOPHEN 5-325 MG PO TABS
1.0000 | ORAL_TABLET | ORAL | Status: DC | PRN
  Administered 2011-07-20 – 2011-07-23 (×5): 1 via ORAL
  Filled 2011-07-20 (×2): qty 1
  Filled 2011-07-20: qty 2
  Filled 2011-07-20 (×3): qty 1

## 2011-07-20 MED ORDER — MAGNESIUM SULFATE 40 MG/ML IJ SOLN
2.0000 g | Freq: Once | INTRAMUSCULAR | Status: AC | PRN
  Filled 2011-07-20: qty 50

## 2011-07-20 MED ORDER — FENTANYL CITRATE 0.05 MG/ML IJ SOLN
INTRAMUSCULAR | Status: DC | PRN
  Administered 2011-07-20 (×3): 50 ug via INTRAVENOUS

## 2011-07-20 MED ORDER — POTASSIUM CHLORIDE CRYS ER 20 MEQ PO TBCR: 20.0000 meq | EXTENDED_RELEASE_TABLET | Freq: Once | ORAL | Status: AC | PRN

## 2011-07-20 MED ORDER — ASPIRIN EC 81 MG PO TBEC
81.0000 mg | DELAYED_RELEASE_TABLET | Freq: Every day | ORAL | Status: DC
  Administered 2011-07-20 – 2011-07-24 (×5): 81 mg via ORAL
  Filled 2011-07-20 (×5): qty 1

## 2011-07-20 MED ORDER — GLIMEPIRIDE 2 MG PO TABS
2.0000 mg | ORAL_TABLET | Freq: Every day | ORAL | Status: DC
  Administered 2011-07-22 – 2011-07-24 (×2): 2 mg via ORAL
  Filled 2011-07-20 (×5): qty 1

## 2011-07-20 MED ORDER — PROPOFOL 10 MG/ML IV EMUL
INTRAVENOUS | Status: DC | PRN
  Administered 2011-07-20: 30 mg via INTRAVENOUS

## 2011-07-20 MED ORDER — MORPHINE SULFATE 2 MG/ML IJ SOLN: 2.0000 mg | INTRAMUSCULAR | Status: DC | PRN

## 2011-07-20 MED ORDER — WARFARIN SODIUM 5 MG PO TABS: 5.0000 mg | ORAL_TABLET | ORAL | Status: DC

## 2011-07-20 MED ORDER — ONDANSETRON HCL 4 MG/2ML IJ SOLN
4.0000 mg | Freq: Four times a day (QID) | INTRAMUSCULAR | Status: DC | PRN
Start: 2011-07-20 — End: 2011-07-20

## 2011-07-20 MED ORDER — METOPROLOL TARTRATE 25 MG PO TABS
37.5000 mg | ORAL_TABLET | Freq: Two times a day (BID) | ORAL | Status: DC
  Administered 2011-07-21 – 2011-07-24 (×7): 37.5 mg via ORAL
  Filled 2011-07-20 (×9): qty 1

## 2011-07-20 MED ORDER — GUAIFENESIN-DM 100-10 MG/5ML PO SYRP: 15.0000 mL | ORAL_SOLUTION | ORAL | Status: DC | PRN

## 2011-07-20 MED ORDER — WARFARIN SODIUM 2.5 MG PO TABS
2.5000 mg | ORAL_TABLET | ORAL | Status: DC
  Administered 2011-07-21 – 2011-07-23 (×2): 2.5 mg via ORAL
  Filled 2011-07-20 (×2): qty 1

## 2011-07-20 MED ORDER — ACETAMINOPHEN 325 MG PO TABS
325.0000 mg | ORAL_TABLET | ORAL | Status: DC | PRN
  Filled 2011-07-20: qty 2

## 2011-07-20 MED ORDER — WARFARIN - PHYSICIAN DOSING INPATIENT: Freq: Every day | Status: DC

## 2011-07-20 MED ORDER — VANCOMYCIN HCL 500 MG IV SOLR
500.0000 mg | Freq: Once | INTRAVENOUS | Status: AC
  Administered 2011-07-20: 500 mg via INTRAVENOUS
  Filled 2011-07-20 (×2): qty 500

## 2011-07-20 MED ORDER — FENTANYL CITRATE 0.05 MG/ML IJ SOLN: 25.0000 ug | INTRAMUSCULAR | Status: DC | PRN

## 2011-07-20 MED ORDER — PHENYLEPHRINE HCL 10 MG/ML IJ SOLN
INTRAMUSCULAR | Status: DC | PRN
  Administered 2011-07-20 (×5): 50 ug via INTRAVENOUS

## 2011-07-20 MED ORDER — ACETAMINOPHEN 650 MG RE SUPP: 325.0000 mg | RECTAL | Status: DC | PRN

## 2011-07-20 MED ORDER — LABETALOL HCL 5 MG/ML IV SOLN
10.0000 mg | INTRAVENOUS | Status: DC | PRN
  Filled 2011-07-20: qty 4

## 2011-07-20 MED ORDER — WARFARIN SODIUM 5 MG PO TABS
5.0000 mg | ORAL_TABLET | ORAL | Status: DC
  Administered 2011-07-22: 5 mg via ORAL
  Filled 2011-07-20 (×2): qty 1

## 2011-07-20 SURGICAL SUPPLY — 51 items
BANDAGE ELASTIC 4 VELCRO ST LF (GAUZE/BANDAGES/DRESSINGS) ×2 IMPLANT
BANDAGE ELASTIC 6 VELCRO ST LF (GAUZE/BANDAGES/DRESSINGS) IMPLANT
BANDAGE ESMARK 6X9 LF (GAUZE/BANDAGES/DRESSINGS) IMPLANT
BANDAGE GAUZE ELAST BULKY 4 IN (GAUZE/BANDAGES/DRESSINGS) ×1 IMPLANT
BNDG CMPR 9X6 STRL LF SNTH (GAUZE/BANDAGES/DRESSINGS)
BNDG COHESIVE 6X5 TAN STRL LF (GAUZE/BANDAGES/DRESSINGS) ×2 IMPLANT
BNDG ESMARK 6X9 LF (GAUZE/BANDAGES/DRESSINGS)
CANISTER SUCTION 2500CC (MISCELLANEOUS) ×2 IMPLANT
CLIP TI MEDIUM 6 (CLIP) ×1 IMPLANT
CLOTH BEACON ORANGE TIMEOUT ST (SAFETY) ×2 IMPLANT
COVER SURGICAL LIGHT HANDLE (MISCELLANEOUS) ×4 IMPLANT
CUFF TOURNIQUET SINGLE 34IN LL (TOURNIQUET CUFF) IMPLANT
CUFF TOURNIQUET SINGLE 44IN (TOURNIQUET CUFF) IMPLANT
DRAIN CHANNEL 19F RND (DRAIN) IMPLANT
DRAPE ORTHO SPLIT 77X108 STRL (DRAPES) ×4
DRAPE PROXIMA HALF (DRAPES) ×2 IMPLANT
DRAPE SURG ORHT 6 SPLT 77X108 (DRAPES) ×2 IMPLANT
DRSG ADAPTIC 3X8 NADH LF (GAUZE/BANDAGES/DRESSINGS) ×2 IMPLANT
ELECT REM PT RETURN 9FT ADLT (ELECTROSURGICAL) ×2
ELECTRODE REM PT RTRN 9FT ADLT (ELECTROSURGICAL) ×1 IMPLANT
EVACUATOR SILICONE 100CC (DRAIN) IMPLANT
GAUZE SPONGE 4X4 16PLY XRAY LF (GAUZE/BANDAGES/DRESSINGS) ×2 IMPLANT
GLOVE BIOGEL PI IND STRL 7.5 (GLOVE) ×1 IMPLANT
GLOVE BIOGEL PI INDICATOR 7.5 (GLOVE) ×1
GLOVE SURG SS PI 7.5 STRL IVOR (GLOVE) ×2 IMPLANT
GOWN PREVENTION PLUS XXLARGE (GOWN DISPOSABLE) ×2 IMPLANT
GOWN STRL NON-REIN LRG LVL3 (GOWN DISPOSABLE) ×5 IMPLANT
KIT BASIN OR (CUSTOM PROCEDURE TRAY) ×2 IMPLANT
KIT ROOM TURNOVER OR (KITS) ×2 IMPLANT
NS IRRIG 1000ML POUR BTL (IV SOLUTION) ×2 IMPLANT
PACK GENERAL/GYN (CUSTOM PROCEDURE TRAY) ×2 IMPLANT
PAD ARMBOARD 7.5X6 YLW CONV (MISCELLANEOUS) ×4 IMPLANT
PADDING CAST COTTON 6X4 STRL (CAST SUPPLIES) IMPLANT
SAW GIGLI STERILE 20 (MISCELLANEOUS) ×2 IMPLANT
SPONGE GAUZE 4X4 12PLY (GAUZE/BANDAGES/DRESSINGS) ×2 IMPLANT
STAPLER VISISTAT 35W (STAPLE) ×2 IMPLANT
STOCKINETTE IMPERVIOUS LG (DRAPES) ×2 IMPLANT
SUT BONE WAX W31G (SUTURE) IMPLANT
SUT ETHILON 3 0 PS 1 (SUTURE) IMPLANT
SUT SILK 0 TIES 10X30 (SUTURE) ×3 IMPLANT
SUT SILK 2 0 (SUTURE) ×2
SUT SILK 2 0 SH CR/8 (SUTURE) ×2 IMPLANT
SUT SILK 2-0 18XBRD TIE 12 (SUTURE) ×1 IMPLANT
SUT SILK 3 0 (SUTURE)
SUT SILK 3-0 18XBRD TIE 12 (SUTURE) IMPLANT
SUT VIC AB 2-0 CT1 18 (SUTURE) ×4 IMPLANT
TAPE UMBILICAL COTTON 1/8X30 (MISCELLANEOUS) ×2 IMPLANT
TOWEL OR 17X24 6PK STRL BLUE (TOWEL DISPOSABLE) ×3 IMPLANT
TOWEL OR 17X26 10 PK STRL BLUE (TOWEL DISPOSABLE) ×2 IMPLANT
UNDERPAD 30X30 INCONTINENT (UNDERPADS AND DIAPERS) ×2 IMPLANT
WATER STERILE IRR 1000ML POUR (IV SOLUTION) ×2 IMPLANT

## 2011-07-20 NOTE — Progress Notes (Signed)
ANTIBIOTIC CONSULT NOTE - INITIAL  Pharmacy Consult for Renal antibiotic adjustment Indication: Post op surgical prophylaxis  Allergies  Allergen Reactions  . Ancef (Cefazolin) Rash    Patient Measurements: Height: 6' (182.9 cm) Weight: 232 lb (105.235 kg) IBW/kg (Calculated) : 77.6  Adjusted Body Weight:   Vital Signs: Temp: 98.8 F (37.1 C) (07/12 1835) Temp src: Oral (07/12 1835) BP: 134/70 mmHg (07/12 1835) Pulse Rate: 82  (07/12 1835) Intake/Output from previous day:   Intake/Output from this shift:    Labs:  Basename 07/20/11 1859 07/20/11 0907  WBC 10.4 11.6*  HGB 11.5* 12.3*  PLT 255 241  LABCREA -- --  CREATININE -- 6.10*   Estimated Creatinine Clearance: 12.7 ml/min (by C-G formula based on Cr of 6.1). No results found for this basename: VANCOTROUGH:2,VANCOPEAK:2,VANCORANDOM:2,GENTTROUGH:2,GENTPEAK:2,GENTRANDOM:2,TOBRATROUGH:2,TOBRAPEAK:2,TOBRARND:2,AMIKACINPEAK:2,AMIKACINTROU:2,AMIKACIN:2, in the last 72 hours   Microbiology: Recent Results (from the past 720 hour(s))  SURGICAL PCR SCREEN     Status: Normal   Collection Time   07/18/11 10:47 AM      Component Value Range Status Comment   MRSA, PCR NEGATIVE  NEGATIVE Final    Staphylococcus aureus NEGATIVE  NEGATIVE Final     Medical History: Past Medical History  Diagnosis Date  . Diabetes mellitus   . Gout   . Hypertension   . Hyperlipidemia   . Chronic kidney disease     Started dialysis October 2012  . Arthritis   . Prostate cancer     s/p seed implant  . Cataract     BILATERAL-BEEN REMOVED  . Neuromuscular disorder     DABETIC NEUROPATHY-LOWER EXTREMITY  . Atrial fibrillation     on coumadin  . Aortic stenosis     moderate to severe, not felt to be a surgical candidate  . Anemia     secondary to end stage renal disease  . Coronary artery disease     remote CABG in 1996; last stress test in 2005; last cath 2005  . Diabetic neuropathy   . Edema   . PAD (peripheral artery disease)    . Diabetic retinopathy   . Chronic anticoagulation     on coumadin  . Nephrolithiasis     prior history of percutaneous nephrostomy  . Shortness of breath     Medications:  Prescriptions prior to admission  Medication Sig Dispense Refill  . allopurinol (ZYLOPRIM) 300 MG tablet Take 150 mg by mouth daily.      [name] aspirin EC 81 MG tablet Take 81 mg by mouth daily.      . calcium carbonate (TUMS - DOSED IN MG ELEMENTAL CALCIUM) 500 MG chewable tablet Chew 1 tablet by mouth 3 (three) times daily before meals. Phosphorous binder      . estazolam (PROSOM) 2 MG tablet Take 1 mg by mouth at bedtime as needed. For sleep      . ezetimibe-simvastatin (VYTORIN) 10-40 MG per tablet Take 1 tablet by mouth at bedtime.  30 tablet  0  . glimepiride (AMARYL) 4 MG tablet Take 2 mg by mouth daily before breakfast.        . metoprolol tartrate (LOPRESSOR) 25 MG tablet Take 37.5 mg by mouth 2 (two) times daily. Hold for systolic blood pressure(top number) less than 110 ,and heart rate less than 60, take 1/2 tablet by mouth bid      . silver sulfADIAZINE (SILVADENE) 1 % cream Apply 1 application topically daily.      . traMADol (ULTRAM) 50 MG tablet Take 50 mg  by mouth 2 (two) times daily as needed. pain      . warfarin (COUMADIN) 5 MG tablet Take 2.5-5 mg by mouth daily. Pt takes 2.5mg  Mon, Wed, Fri, Sat and 5mg  Tues, Thurs, Sun      . HYDROcodone-acetaminophen (VICODIN) 5-500 MG per tablet Take 1 tablet by mouth every 6 (six) hours as needed. For pain      . Nutritional Supplements (FEEDING SUPPLEMENT, NEPRO CARB STEADY,) LIQD Take 237 mLs by mouth 3 (three) times daily as needed.       Assessment: 79yom s/p BKA to receive Vancomycin for post op surgical prophylaxis. Pharmacy has been consulted to adjust antibiotics for renal function. Patient has ESRD on HD (TTS per RN). Patient received Vancomycin 1500mg  preop (~1550). Will adjust regimen to give patient an additional 500mg  (expected pre-HD level ~20) to  provide adequate coverage until next HD session. - Wt 105.2kg  Goal of Therapy:  Pre-HD level: 15-25  Plan:  1. Vancomycin 500mg  IV x 1   Cleon Dew [phone] 07/20/2011,7:48 PM

## 2011-07-20 NOTE — Anesthesia Preprocedure Evaluation (Addendum)
Anesthesia Evaluation  Patient identified by MRN, date of birth, ID band Patient awake    Reviewed: Allergy & Precautions, H&P , NPO status , Patient's Chart, lab work & pertinent test results, reviewed documented beta blocker date and time   Airway Mallampati: II  Neck ROM: full    Dental   Pulmonary shortness of breath, pneumonia -, resolved, former smoker,          Cardiovascular hypertension, Pt. on medications and Pt. on home beta blockers + CAD and + CABG + dysrhythmias Atrial Fibrillation + Valvular Problems/Murmurs AS     Neuro/Psych negative psych ROS   GI/Hepatic negative GI ROS, Neg liver ROS,   Endo/Other  Type 2, Oral Hypoglycemic Agents  Renal/GU ESRF and DialysisRenal disease     Musculoskeletal  (+) Arthritis -, Osteoarthritis,    Abdominal   Peds  Hematology negative hematology ROS (+)   Anesthesia Other Findings   Reproductive/Obstetrics negative OB ROS                          Anesthesia Physical Anesthesia Plan  ASA: III  Anesthesia Plan: General   Post-op Pain Management:    Induction: Intravenous  Airway Management Planned: LMA  Additional Equipment:   Intra-op Plan:   Post-operative Plan: Extubation in OR  Informed Consent: I have reviewed the patients History and Physical, chart, labs and discussed the procedure including the risks, benefits and alternatives for the proposed anesthesia with the patient or authorized representative who has indicated his/her understanding and acceptance.   Dental advisory given  Plan Discussed with:   Anesthesia Plan Comments:        Anesthesia Quick Evaluation

## 2011-07-20 NOTE — Anesthesia Procedure Notes (Signed)
Procedure Name: LMA Insertion Date/Time: 07/20/2011 3:50 PM Performed by: Xiao Graul S Pre-anesthesia Checklist: Patient identified, Emergency Drugs available, Suction available, Patient being monitored and Timeout performed Patient Re-evaluated:Patient Re-evaluated prior to inductionOxygen Delivery Method: Circle system utilized Preoxygenation: Pre-oxygenation with 100% oxygen Intubation Type: IV induction Ventilation: Mask ventilation without difficulty LMA: LMA inserted LMA Size: 5.0 Tube type: Oral Number of attempts: 1

## 2011-07-20 NOTE — Transfer of Care (Signed)
Immediate Anesthesia Transfer of Care Note  Patient: Alan [name]  Procedure(s) Performed: Procedure(s) (LRB): AMPUTATION BELOW KNEE (Left)  Patient Location: PACU  Anesthesia Type: General  Level of Consciousness: awake, alert  and oriented  Airway & Oxygen Therapy: Patient Spontanous Breathing and Patient connected to nasal cannula oxygen  Post-op Assessment: Report given to PACU RN and Post -op Vital signs reviewed and stable  Post vital signs: Reviewed and stable  Complications: No apparent anesthesia complications

## 2011-07-20 NOTE — Interval H&P Note (Signed)
History and Physical Interval Note:  07/20/2011 2:46 PM  Alan [name]  has presented today for surgery, with the diagnosis of Peripheral Vascular Disease with ulcer  The various methods of treatment have been discussed with the patient and family. After consideration of risks, benefits and other options for treatment, the patient has consented to  Procedure(s) (LRB): AMPUTATION BELOW KNEE (Left) as a surgical intervention .  The patient's history has been reviewed, patient examined, no change in status, stable for surgery.  I have reviewed the patients' chart and labs.  Questions were answered to the patient's satisfaction.     BRABHAM IV, V. WELLS

## 2011-07-20 NOTE — H&P (View-Only) (Signed)
[facility]   Patient name: Alan [name] MRN: [id number] DOB: [birth year] Sex: male     Chief Complaint  Patient presents with  . PVD    2 week f/u to discuss amputation - HD TTS    HISTORY OF PRESENT ILLNESS: The patient is back for followup of his bilateral lower extremity ulcers. He recently underwent angiography. He was found to have non-reconstructable disease on the left.  Past Medical History  Diagnosis Date  . Diabetes mellitus   . Gout   . Hypertension   . Hyperlipidemia   . Chronic kidney disease     Started dialysis October 2012  . Arthritis   . Prostate cancer     s/p seed implant  . Cataract     BILATERAL-BEEN REMOVED  . Neuromuscular disorder     DABETIC NEUROPATHY-LOWER EXTREMITY  . Atrial fibrillation     on coumadin  . Aortic stenosis     moderate to severe, not felt to be a surgical candidate  . Anemia     secondary to end stage renal disease  . Coronary artery disease     remote CABG in 1996; last stress test in 2005; last cath 2005  . Diabetic neuropathy   . Edema   . PAD (peripheral artery disease)   . Diabetic retinopathy   . Chronic anticoagulation     on coumadin  . Nephrolithiasis     prior history of percutaneous nephrostomy  . Shortness of breath     Past Surgical History  Procedure Date  . Ventral hernia repair   . Inguinal hernia repair     right side  . Colonoscopy   . Kidney stone surgery   . Polypectomy   . Tonsillectomy   . Toe surgery     removal little toe right foot  . Cardiac catheterization 04/19/2003    SEVERE 2 VESSEL OBSTRUCTIVE ATHERSCLEROTIC CAD  . Coronary artery bypass graft 1996    LIMA GRAFT TO THE LAD, SEQUENTIAL SAPHENOUS  VEIN GRAFT TO THE FIRST AND SECOND DIAGONIAL BRANCHES, SEQUENTIAL SAPHENOUS VEIN GRAFT TO THE ACUTE MARGINAL, POSTERIOR DESCENDING, AND POSTERIOR LATERAL BRANCHES OF THE RIGHT CORONARY ARTERY  . Radioactive seed implant   . US echocardiography  09/06/2009    EF 55-60%  . Cardiovascular stress test 04/13/2003    EF 54%. EVIDENCE OF ANTERO-APICAL ISCHEMIA. NORMAL LV SIZE AND FUNCTION  . Tee without cardioversion 05/09/2011    Procedure: TRANSESOPHAGEAL ECHOCARDIOGRAM (TEE);  Surgeon: Laurey Morale, MD;  Location: [facility] ENDOSCOPY;  Service: Cardiovascular;  Laterality: N/A;    History   Social History  . Marital Status: Married    Spouse Name: N/A    Number of Children: N/A  . Years of Education: N/A   Occupational History  . Not on file.   Social History Main Topics  . Smoking status: Former Smoker    Quit date: 04/10/1968  . Smokeless tobacco: Former Neurosurgeon    Types: Chew  . Alcohol Use: No  . Drug Use: No  . Sexually Active: No   Other Topics Concern  . Not on file   Social History Narrative  . No narrative on file    Family History  Problem Relation Age of Onset  . Heart disease Father   . Hypertension Brother   . Stroke Brother     Allergies as of 07/16/2011 - Review Complete 07/16/2011  Allergen Reaction Noted  . Ancef (cefazolin) Rash 05/18/2011  Current Outpatient Prescriptions on File Prior to Visit  Medication Sig Dispense Refill  . allopurinol (ZYLOPRIM) 300 MG tablet Take 150 mg by mouth daily.      [name] aspirin EC 81 MG tablet Take 81 mg by mouth daily.      . calcium carbonate (TUMS - DOSED IN MG ELEMENTAL CALCIUM) 500 MG chewable tablet Chew 1 tablet by mouth 3 (three) times daily before meals. Phosphorous binder      . estazolam (PROSOM) 2 MG tablet Take 1 mg by mouth at bedtime as needed. For sleep      . ezetimibe-simvastatin (VYTORIN) 10-40 MG per tablet Take 1 tablet by mouth at bedtime.  30 tablet  0  . glimepiride (AMARYL) 4 MG tablet Take 2 mg by mouth daily before breakfast.        . HYDROcodone-acetaminophen (VICODIN) 5-500 MG per tablet Take 1 tablet by mouth every 6 (six) hours as needed. For pain      . metoprolol tartrate (LOPRESSOR) 25 MG tablet Take 37.5 mg by mouth 2 (two) times  daily. Hold for systolic blood pressure(top number) less than 110 ,and heart rate less than 60, take 1/2 tablet by mouth bid      . Nutritional Supplements (FEEDING SUPPLEMENT, NEPRO CARB STEADY,) LIQD Take 237 mLs by mouth 3 (three) times daily as needed.      . silver sulfADIAZINE (SILVADENE) 1 % cream Apply 1 application topically daily.      . traMADol (ULTRAM) 50 MG tablet Take 50 mg by mouth 2 (two) times daily as needed. pain      . VANCOMYCIN HCL IV Inject into the vein. Tuesday, Thursday, Saturday      . warfarin (COUMADIN) 5 MG tablet Take 1 tablet (5 mg total) by mouth as directed.  30 tablet  3     REVIEW OF SYSTEMS: No change from prior  PHYSICAL EXAMINATION:   Vital signs are BP 119/51  Pulse 73  Temp 98 F (36.7 C) (Oral)  Ht 6\' 1"  (1.854 m)  Wt 243 lb (110.224 kg)  BMI 32.06 kg/m2  SpO2 100% General: The patient appears their stated age. HEENT:  No gross abnormalities Pulmonary:  Non labored breathing Abdomen: Soft and non-tender Musculoskeletal: There are no major deformities. Neurologic: No focal weakness or paresthesias are detected, Skin: Right lateral malleolar wall serve and chronic ulcer to third toe. Gangrenous changes to the left toes and left heel Psychiatric: The patient has normal affect. Cardiovascular: There is a regular rate and rhythm without significant murmur appreciated.   Diagnostic Studies None  Assessment: Ischemic left leg Plan: By angiogram he does not have options for revascularization. I do not believe that his left leg is salvageable based on the extent of tissue loss on the foot as well as on the heel. I feel his best option is amputation. We discussed below knee versus above-knee amputation. We have decided to proceed with a left below-knee amputation. He understands the risks and benefits including the possibility to convert this to an above-knee amputation for nonhealing. We have scheduled this operation for this Friday, July 12. He  will discontinue his Coumadin today.  Jorge Ny, M.D. [facility] [place] Office: [phone] Pager:  [phone]

## 2011-07-20 NOTE — Preoperative (Signed)
Beta Blockers   Reason not to administer Beta Blockers:Not Applicable, taken this am 

## 2011-07-20 NOTE — Anesthesia Postprocedure Evaluation (Signed)
  Anesthesia Post-op Note  Patient: Alan [name]  Procedure(s) Performed: Procedure(s) (LRB): AMPUTATION BELOW KNEE (Left)  Patient Location: PACU  Anesthesia Type: General  Level of Consciousness: awake  Airway and Oxygen Therapy: Patient Spontanous Breathing and Patient connected to nasal cannula oxygen  Post-op Pain: mild  Post-op Assessment: Post-op Vital signs reviewed, Patient's Cardiovascular Status Stable, Respiratory Function Stable, Patent Airway and No signs of Nausea or vomiting  Post-op Vital Signs: Reviewed and stable  Complications: No apparent anesthesia complications

## 2011-07-21 ENCOUNTER — Encounter (HOSPITAL_COMMUNITY): Payer: Self-pay | Admitting: *Deleted

## 2011-07-21 ENCOUNTER — Inpatient Hospital Stay (HOSPITAL_COMMUNITY): Payer: MEDICARE

## 2011-07-21 LAB — BASIC METABOLIC PANEL
BUN: 42 mg/dL — ABNORMAL HIGH (ref 6–23)
Calcium: 8.8 mg/dL (ref 8.4–10.5)
GFR calc Af Amer: 7 mL/min — ABNORMAL LOW (ref >90)
GFR calc non Af Amer: 6 mL/min — ABNORMAL LOW (ref >90)
Potassium: 5.1 mEq/L (ref 3.5–5.1)
Sodium: 131 mEq/L — ABNORMAL LOW (ref 135–145)

## 2011-07-21 LAB — CBC
Platelets: 228 10*3/uL (ref 150–400)
RBC: 3.23 MIL/uL — ABNORMAL LOW (ref 4.22–5.81)
WBC: 10.6 10*3/uL — ABNORMAL HIGH (ref 4.0–10.5)

## 2011-07-21 LAB — PROTIME-INR: Prothrombin Time: 17 seconds — ABNORMAL HIGH (ref 11.6–15.2)

## 2011-07-21 LAB — GLUCOSE, CAPILLARY
Glucose-Capillary: 170 mg/dL — ABNORMAL HIGH (ref 70–99)
Glucose-Capillary: 129 mg/dL — ABNORMAL HIGH (ref 70–99)

## 2011-07-21 LAB — HEMOGLOBIN A1C: Mean Plasma Glucose: 134 mg/dL — ABNORMAL HIGH (ref <117)

## 2011-07-21 MED ORDER — HYDROMORPHONE HCL PF 1 MG/ML IJ SOLN
INTRAMUSCULAR | Status: AC
  Administered 2011-07-21: 1 mg via INTRAVENOUS
  Filled 2011-07-21: qty 1

## 2011-07-21 MED ORDER — DARBEPOETIN ALFA-POLYSORBATE 150 MCG/0.3ML IJ SOLN
INTRAMUSCULAR | Status: AC
  Administered 2011-07-21: 150 ug via INTRAVENOUS
  Filled 2011-07-21: qty 0.3

## 2011-07-21 MED ORDER — PARICALCITOL 5 MCG/ML IV SOLN
4.0000 ug | INTRAVENOUS | Status: DC
  Administered 2011-07-21 – 2011-07-24 (×2): 4 ug via INTRAVENOUS
  Filled 2011-07-21 (×2): qty 0.8

## 2011-07-21 MED ORDER — HYDROMORPHONE HCL PF 1 MG/ML IJ SOLN
1.0000 mg | Freq: Once | INTRAMUSCULAR | Status: AC
  Administered 2011-07-21: 1 mg via INTRAVENOUS

## 2011-07-21 MED ORDER — PARICALCITOL 5 MCG/ML IV SOLN
INTRAVENOUS | Status: AC
  Administered 2011-07-21: 4 ug via INTRAVENOUS
  Filled 2011-07-21: qty 1

## 2011-07-21 MED ORDER — DARBEPOETIN ALFA-POLYSORBATE 150 MCG/0.3ML IJ SOLN
150.0000 ug | INTRAMUSCULAR | Status: DC
  Administered 2011-07-21: 150 ug via INTRAVENOUS
  Filled 2011-07-21: qty 0.3

## 2011-07-21 MED ORDER — LIDOCAINE HCL 2 % IJ SOLN
INTRAMUSCULAR | Status: AC
  Administered 2011-07-21: 5 mg
  Filled 2011-07-21: qty 1

## 2011-07-21 NOTE — Progress Notes (Signed)
Physical Therapy Cancellation Note: order received and chart reviewed however pt in HD and unable to perform evaluation at this time. Will attempt later as time allows. Thanks Delaney Meigs, PT [phone]

## 2011-07-21 NOTE — Consult Note (Signed)
Reason for Consult: Continuity of ESRD care Referring Physician: Durene Cal MD- Vascular surgery   HPI: 78 year old [name] with a history of ESRD on HD at the [facility] center on a TTS schedule who is admitted after undergoing left BKA for PVD and non-healing ulcer yesterday. Currently endorses some stump site pain and what appears to be positional back pain. Denies any chest pain or shortness of breath. Had some transient nausea last night that has since resolved.  Dialysis Prescription: 4 1/4hr, EDW 108.5, QB 500, QD AF 1.5, 2K/2.25Ca. RUA AVF 15G needles. Heparin 3400 Units TIW. Zemplar TIW, No epogen. Venofer 100mg  IV Q 2weekly.   Past Medical History  Diagnosis Date  . Diabetes mellitus   . Gout   . Hypertension   . Hyperlipidemia   . Chronic kidney disease     Started dialysis October 2012  . Arthritis   . Prostate cancer     s/p seed implant  . Cataract     BILATERAL-BEEN REMOVED  . Neuromuscular disorder     DABETIC NEUROPATHY-LOWER EXTREMITY  . Atrial fibrillation     on coumadin  . Aortic stenosis     moderate to severe, not felt to be a surgical candidate  . Anemia     secondary to end stage renal disease  . Coronary artery disease     remote CABG in 1996; last stress test in 2005; last cath 2005  . Diabetic neuropathy   . Edema   . PAD (peripheral artery disease)   . Diabetic retinopathy   . Chronic anticoagulation     on coumadin  . Nephrolithiasis     prior history of percutaneous nephrostomy  . Shortness of breath     Past Surgical History  Procedure Date  . Ventral hernia repair   . Inguinal hernia repair     right side  . Colonoscopy   . Kidney stone surgery   . Polypectomy   . Tonsillectomy   . Toe surgery     removal little toe right foot  . Cardiac catheterization 04/19/2003    SEVERE 2 VESSEL OBSTRUCTIVE ATHERSCLEROTIC CAD  . Coronary artery bypass graft 1996    LIMA GRAFT TO THE LAD, SEQUENTIAL SAPHENOUS  VEIN GRAFT TO THE FIRST AND  SECOND DIAGONIAL BRANCHES, SEQUENTIAL SAPHENOUS VEIN GRAFT TO THE ACUTE MARGINAL, POSTERIOR DESCENDING, AND POSTERIOR LATERAL BRANCHES OF THE RIGHT CORONARY ARTERY  . Radioactive seed implant   . US echocardiography 09/06/2009    EF 55-60%  . Cardiovascular stress test 04/13/2003    EF 54%. EVIDENCE OF ANTERO-APICAL ISCHEMIA. NORMAL LV SIZE AND FUNCTION  . Tee without cardioversion 05/09/2011    Procedure: TRANSESOPHAGEAL ECHOCARDIOGRAM (TEE);  Surgeon: Laurey Morale, MD;  Location: [facility] ENDOSCOPY;  Service: Cardiovascular;  Laterality: N/A;  . Hernia repair     Family History  Problem Relation Age of Onset  . Heart disease Father   . Hypertension Brother   . Stroke Brother     Social History:  reports that he quit smoking about Alan years ago. His smoking use included Cigarettes. He quit after 2 years of use. He has quit using smokeless tobacco. His smokeless tobacco use included Chew. He reports that he does not drink alcohol or use illicit drugs.  Allergies:  Allergies  Allergen Reactions  . Ancef (Cefazolin) Rash    Medications:  Scheduled:   . allopurinol  150 mg Oral Daily  . aspirin EC  81 mg Oral Daily  .  calcium carbonate  1 tablet Oral TID AC  . docusate sodium  100 mg Oral Daily  . enoxaparin (LOVENOX) injection  30 mg Subcutaneous Q24H  . ezetimibe-simvastatin  1 tablet Oral QHS  . glimepiride  2 mg Oral QAC breakfast  . insulin aspart  0-15 Units Subcutaneous TID WC  . lidocaine      . metoprolol tartrate  37.5 mg Oral BID  . pantoprazole  40 mg Oral Q1200  . silver sulfADIAZINE  1 application Topical Daily  . vancomycin  1,500 mg Intravenous 60 min Pre-Op  . vancomycin  500 mg Intravenous Once  . warfarin  2.5 mg Oral Custom  . warfarin  5 mg Oral Custom  . Warfarin - Physician Dosing Inpatient   Does not apply q1800  . DISCONTD: vancomycin  1,000 mg Intravenous Q12H  . DISCONTD: warfarin  5 mg Oral UD    Results for orders placed during the hospital encounter  of 07/20/11 (from the past 48 hour(s))  PROTIME-INR     Status: Abnormal   Collection Time   07/20/11  9:07 AM      Component Value Range Comment   Prothrombin Time 16.3 (*) 11.6 - 15.2 seconds    INR 1.29  0.00 - 1.49   CBC     Status: Abnormal   Collection Time   07/20/11  9:07 AM      Component Value Range Comment   WBC 11.6 (*) 4.0 - 10.5 K/uL    RBC 4.23  4.22 - 5.81 MIL/uL    Hemoglobin 12.3 (*) 13.0 - 17.0 g/dL    HCT 40.9  81.1 - 91.4 %    MCV 92.9  78.0 - 100.0 fL    MCH 29.1  26.0 - 34.0 pg    MCHC 31.3  30.0 - 36.0 g/dL    RDW 78.2  95.6 - 21.3 %    Platelets 241  150 - 400 K/uL   BASIC METABOLIC PANEL     Status: Abnormal   Collection Time   07/20/11  9:07 AM      Component Value Range Comment   Sodium 138  135 - 145 mEq/L    Potassium 4.9  3.5 - 5.1 mEq/L    Chloride 93 (*) 96 - 112 mEq/L    CO2 33 (*) 19 - 32 mEq/L    Glucose, Bld 110 (*) 70 - 99 mg/dL    BUN 29 (*) 6 - 23 mg/dL    Creatinine, Ser 0.86 (*) 0.50 - 1.35 mg/dL    Calcium 9.6  8.4 - 57.8 mg/dL    GFR calc non Af Amer 8 (*) >90 mL/min    GFR calc Af Amer 9 (*) >90 mL/min   GLUCOSE, CAPILLARY     Status: Normal   Collection Time   07/20/11  9:23 AM      Component Value Range Comment   Glucose-Capillary 97  70 - 99 mg/dL   GLUCOSE, CAPILLARY     Status: Abnormal   Collection Time   07/20/11 11:29 AM      Component Value Range Comment   Glucose-Capillary 107 (*) 70 - 99 mg/dL    Comment 1 Notify RN     GLUCOSE, CAPILLARY     Status: Normal   Collection Time   07/20/11  1:29 PM      Component Value Range Comment   Glucose-Capillary 91  70 - 99 mg/dL   GLUCOSE, CAPILLARY     Status: Normal  Collection Time   07/20/11  3:02 PM      Component Value Range Comment   Glucose-Capillary 82  70 - 99 mg/dL   GLUCOSE, CAPILLARY     Status: Normal   Collection Time   07/20/11  5:57 PM      Component Value Range Comment   Glucose-Capillary 93  70 - 99 mg/dL   CBC     Status: Abnormal   Collection Time    07/20/11  6:59 PM      Component Value Range Comment   WBC 10.4  4.0 - 10.5 K/uL    RBC 3.93 (*) 4.22 - 5.81 MIL/uL    Hemoglobin 11.5 (*) 13.0 - 17.0 g/dL    HCT 16.1 (*) 09.6 - 52.0 %    MCV 92.4  78.0 - 100.0 fL    MCH 29.3  26.0 - 34.0 pg    MCHC 31.7  30.0 - 36.0 g/dL    RDW 04.5  40.9 - 81.1 %    Platelets 255  150 - 400 K/uL   CREATININE, SERUM     Status: Abnormal   Collection Time   07/20/11  6:59 PM      Component Value Range Comment   Creatinine, Ser 6.50 (*) 0.50 - 1.35 mg/dL    GFR calc non Af Amer 7 (*) >90 mL/min    GFR calc Af Amer 8 (*) >90 mL/min   HEMOGLOBIN A1C     Status: Abnormal   Collection Time   07/20/11  6:59 PM      Component Value Range Comment   Hemoglobin A1C 6.3 (*) <5.7 %    Mean Plasma Glucose 134 (*) <117 mg/dL   GLUCOSE, CAPILLARY     Status: Abnormal   Collection Time   07/20/11 10:50 PM      Component Value Range Comment   Glucose-Capillary 213 (*) 70 - 99 mg/dL   GLUCOSE, CAPILLARY     Status: Abnormal   Collection Time   07/21/11  6:37 AM      Component Value Range Comment   Glucose-Capillary 187 (*) 70 - 99 mg/dL     No results found.  Review of Systems  Constitutional: Negative.   HENT: Negative.  Negative for neck pain.   Eyes: Negative.   Respiratory: Negative.   Cardiovascular: Negative.   Genitourinary: Negative.   Musculoskeletal: Positive for back pain. Negative for myalgias and joint pain.  Skin: Negative.   Neurological: Negative.   Endo/Heme/Allergies: Negative.   Psychiatric/Behavioral: Negative.   All other systems reviewed and are negative.   Blood pressure 128/48, pulse 82, temperature 98.2 F (36.8 C), temperature source Oral, resp. rate 15, height 6' (1.829 m), weight 106.6 kg (235 lb 0.2 oz), SpO2 97.00%. Physical Exam  Nursing note and vitals reviewed. Constitutional: He is oriented to person, place, and time. He appears well-developed and well-nourished. No distress.  HENT:  Head: Normocephalic and  atraumatic.  Mouth/Throat: Oropharynx is clear and moist. No oropharyngeal exudate.  Eyes: Conjunctivae and EOM are normal. Pupils are equal, round, and reactive to light. No scleral icterus.  Neck: Normal range of motion. Neck supple. No JVD present. No tracheal deviation present. No thyromegaly present.  Cardiovascular: Normal rate and regular rhythm.  Exam reveals no gallop and no friction rub.   No murmur heard. Respiratory: Effort normal and breath sounds normal. No stridor. No respiratory distress. He has no wheezes. He has no rales.  GI: Soft. Bowel sounds are normal. He  exhibits no distension and no mass. There is no tenderness. There is no rebound and no guarding.  Musculoskeletal: Normal range of motion. He exhibits no edema and no tenderness.       S/p Left BKA- stump site in clean and dry ACE wrap  Lymphadenopathy:    He has no cervical adenopathy.  Neurological: He is alert and oriented to person, place, and time. He displays normal reflexes. No cranial nerve deficit. Coordination normal.  Skin: Skin is warm and dry. No rash noted. He is not diaphoretic. No erythema. No pallor.    Assessment/Plan: 1.S/P Left BKA: Doing well on POD #1, will supplement pain control here at HD with PRN dilaudid. Anticipate DC to rehab/SNF. 2. ESRD: Resume HD and re-evaluate EDW with recent BKA. 3. Anemia: Off ESA with expected post-op Hgb drop- will restart ESA 4. Metabolic Bone disease: Restart Tums and Zemplar 5. Hypertension: Monitor BP trend on metoprolol.  Hiren Peplinski K. 07/21/2011, 8:19 AM

## 2011-07-21 NOTE — Op Note (Signed)
[facility]  Patient name: Alan [name] MRN: [id number] DOB: [birth year] Sex: male  07/20/2011 Pre-operative Diagnosis: Dry gangrene, left foot Post-operative diagnosis:  Same Surgeon:  Jorge Ny Assistants:  Della Goo, P.A. Procedure:   Left below knee amputation Anesthesia:  Gen. Blood Loss:  See anesthesia record Specimens:  Left leg  Findings:  Viable muscle at the amputation flap  Indications:  The patient initially presented with dry gangrene to his left toes and a heel ulcer. Angiography revealed a non-reconstructable vascular tree. He presents for below knee amputation  Procedure:  The patient was identified in the holding area and taken to [facility] OR ROOM 10  The patient was then placed supine on the table. general anesthesia was administered.  The patient was prepped and draped in the usual sterile fashion.  A time out was called and antibiotics were administered.  A circumferential measurement was made 13 cm below the tibial tuberosity. A two thirds one third posterior flap was created based on the measurements above. A #10 blade was used to make an incision. Cautery was used to divide the subcutaneous tissue. The muscle was divided with cautery. The anterior tibial neurovascular complex was isolated and divided. Circumferential exposure was obtained around the tibia and fibula. A Gigli saw was used to transect the tibia. Bone cutters were used to transect the fibula. The popliteal neurovascular complex was isolated. A clamp was placed proximally and distally and this was divided. Next an amputation knife was used to complete amputation through the muscle. I then dissected out the neurovascular bundles of the popliteal complex as well as the anterior tibial complex. They were dissected out above the cut edge of the tibia. They were then ligated with 0 silk ties. Of note the arteries were heavily calcified. I had to place multiple ties for  hemostasis. I then used a rongeur to smooth out the cut surface of the fibula. A rasp was also used on the tibia and fibula to create a smooth surface. The wound was copiously irrigated. Multiple bleeding vessels required ligation with 2-0 Vicryl suture ligatures. Ultimately hemostasis was achieved. There was adequate perfusion to the muscle and the muscle all appeared viable. Once hemostasis was satisfactory I closed the fascia with interrupted 2-0 Vicryl figure-of-eight sutures. The skin was closed with staples and sterile dressings were applied. There were no complications.   Disposition:  To PACU in stable condition.   Juleen China, M.D. [facility] [place] Office: [phone] Pager:  [phone]

## 2011-07-21 NOTE — Progress Notes (Signed)
OT Cancellation Note  Treatment cancelled today due to medical issues with patient which prohibited therapy:  Pt currently in HD currently.    Harrel Carina [place]   OTR/L Pager: [phone] Office: [phone] .

## 2011-07-21 NOTE — Procedures (Signed)
Patient seen on Hemodialysis. QB 400, UF goal 3500 Treatment adjusted as needed.  Alan Bills MD [facility] Office # [phone] Pager # [phone] 8:47 AM

## 2011-07-21 NOTE — Progress Notes (Addendum)
07/21/2011 9:53 AM POD 1  Subjective:  In HD.  Received dilaudid for pain, which improved.  Afebrile x 24 hrs Filed Vitals:   07/21/11 0930  BP: 149/71  Pulse: 86  Temp:   Resp: 15    Physical Exam: Incisions:  Covered with bandage.  Bandage is dry and in tact.   CBC    Component Value Date/Time   WBC 10.6* 07/21/2011 0758   RBC 3.23* 07/21/2011 0758   HGB 9.3* 07/21/2011 0758   HCT 29.5* 07/21/2011 0758   PLT 228 07/21/2011 0758   MCV 91.3 07/21/2011 0758   MCH 28.8 07/21/2011 0758   MCHC 31.5 07/21/2011 0758   RDW 13.6 07/21/2011 0758   LYMPHSABS 0.8 05/18/2011 1547   MONOABS 0.2 05/18/2011 1547   EOSABS 0.0 05/18/2011 1547   BASOSABS 0.0 05/18/2011 1547    BMET    Component Value Date/Time   NA 131* 07/21/2011 0758   K 5.1 07/21/2011 0758   CL 92* 07/21/2011 0758   CO2 28 07/21/2011 0758   GLUCOSE 207* 07/21/2011 0758   BUN 42* 07/21/2011 0758   CREATININE 7.61* 07/21/2011 0758   CALCIUM 8.8 07/21/2011 0758   CALCIUM 8.8 10/27/2010 0734   GFRNONAA 6* 07/21/2011 0758   GFRAA 7* 07/21/2011 0758    INR    Component Value Date/Time   INR 1.36 07/21/2011 0758   INR 3.18 02/06/2011     Intake/Output Summary (Last 24 hours) at 07/21/11 0953 Last data filed at 07/20/11 1900  Gross per 24 hour  Intake    940 ml  Output    250 ml  Net    690 ml     Assessment/Plan:  78 y.o. male is s/p left below amputation  POD 1  -acute surgical blood loss anemia-tolerating -will take down bandage tomorrow   Doreatha Massed, PA-C [facility] [phone] 07/21/2011 9:53 AM  Agree with above. Dressing change tomorrow.  Di Kindle. Edilia Bo, MD, FACS Beeper [phone] 07/21/2011

## 2011-07-22 ENCOUNTER — Other Ambulatory Visit: Payer: Self-pay

## 2011-07-22 LAB — GLUCOSE, CAPILLARY
Glucose-Capillary: 166 mg/dL — ABNORMAL HIGH (ref 70–99)
Glucose-Capillary: 140 mg/dL — ABNORMAL HIGH (ref 70–99)
Glucose-Capillary: 169 mg/dL — ABNORMAL HIGH (ref 70–99)

## 2011-07-22 NOTE — Progress Notes (Signed)
Occupational Therapy Evaluation Patient Details Name: PONCE SKILLMAN MRN: [id number] DOB: 01/10/1931 Today's Date: 07/22/2011 Time: 1825-1910 OT Time Calculation (min): 45 min  OT Assessment / Plan / Recommendation Clinical Impression  78 yo s/p L BKA. Pt's son was present during eval and he stated that his father is very lethargic and confused, which is not his baseline. Pt did not do as well this pm as compared to this am PT's notes. Notified nurse of concern of medical status change. Pt will benefit from skilled Ot services to Max independence with ADL and functional mobility for ADL. Pt will be a good CIR consult when medically stable. Will follow acutely secondary to deficits listed below.    OT Assessment  Patient needs continued OT Services    Follow Up Recommendations  Inpatient Rehab    Barriers to Discharge None    Equipment Recommendations  Defer to next venue    Recommendations for Other Services Rehab consult  Frequency  Min 2X/week    Precautions / Restrictions Precautions Precautions: Fall Precaution Comments: Patient fearful of falling   Pertinent Vitals/Pain 4    ADL  Eating/Feeding: Simulated;Set up Where Assessed - Eating/Feeding: Chair Grooming: Simulated;Minimal assistance Where Assessed - Grooming: Supported sitting Upper Body Bathing: Simulated;Moderate assistance Where Assessed - Upper Body Bathing: Supported sitting Lower Body Bathing: Simulated;+1 Total assistance Where Assessed - Lower Body Bathing: Supine, head of bed up;Rolling right and/or left Upper Body Dressing: Simulated;Maximal assistance Where Assessed - Upper Body Dressing: Unsupported sitting Lower Body Dressing: Simulated;+1 Total assistance Where Assessed - Lower Body Dressing: Supine, head of bed up;Rolling right and/or left Toilet Transfer: Simulated;+2 Total assistance Toilet Transfer: Patient Percentage: 20% Toilet Transfer Method: Stand pivot Acupuncturist:  Other (comment) (bed - chair) Toileting - Clothing Manipulation and Hygiene: +2 Total assistance;Performed Toileting - Clothing Manipulation and Hygiene: Patient Percentage: 20% Where Assessed - Toileting Clothing Manipulation and Hygiene: Rolling right and/or left Equipment Used: Gait belt Transfers/Ambulation Related to ADLs: +2 total A. non ambulatory ADL Comments: ADL affected by cognition    OT Diagnosis: Generalized weakness;Acute pain;Altered mental status  OT Problem List: Decreased strength;Decreased range of motion;Decreased activity tolerance;Impaired balance (sitting and/or standing);Decreased coordination;Decreased cognition;Decreased safety awareness;Decreased knowledge of use of DME or AE;Decreased knowledge of precautions;Obesity;Pain OT Treatment Interventions: Self-care/ADL training;Therapeutic exercise;DME and/or AE instruction;Energy conservation;Therapeutic activities;Cognitive remediation/compensation;Patient/family education;Balance training   OT Goals Acute Rehab OT Goals OT Goal Formulation: With patient/family Time For Goal Achievement: 08/05/11 Potential to Achieve Goals: Good ADL Goals Pt Will Perform Grooming: with set-up;Sitting, edge of bed;Unsupported ADL Goal: Grooming - Progress: Goal set today Pt Will Perform Upper Body Bathing: with set-up;Sitting, edge of bed;Unsupported ADL Goal: Upper Body Bathing - Progress: Goal set today Additional ADL Goal #1: bed mobility with mod A in prep for ADL. ADL Goal: Additional Goal #1 - Progress: Goal set today Additional ADL Goal #2: maintain sitting balance EOB x 10 min in preparatin for ADL. ADL Goal: Additional Goal #2 - Progress: Goal set today Arm Goals Pt Will Complete Theraband Exer: to increase strength;Bilateral upper extremities;2 sets;Level 2 Theraband;with minimal assist Arm Goal: Theraband Exercises - Progress: Goal set today  Visit Information  Last OT Received On: 07/22/11    Subjective Data        Prior Functioning  Vision/Perception  Home Living Lives With: Spouse Available Help at Discharge: Family;Available 24 hours/day (Wife unable to physically assist patient with mobility.) Type of Home: House Home Access: Stairs to enter [employer] of Steps:  2 Entrance Stairs-Rails: Right;Left Home Layout: One level Bathroom Shower/Tub: Health visitor: Standard Bathroom Accessibility: Yes How Accessible: Accessible via walker Home Adaptive Equipment: Bedside commode/3-in-1;Straight cane;Walker - rolling Additional Comments: son can build a ramp Prior Function Level of Independence: Needs assistance Needs Assistance: Bathing;Meal Prep;Light Housekeeping Bath: Minimal Meal Prep: Total Light Housekeeping: Total Able to Take Stairs?: Yes Driving: No Vocation: Retired Musician: No difficulties Dominant Hand: Right      Cognition  Overall Cognitive Status: Impaired Area of Impairment: Memory;Attention;Following commands;Safety/judgement;Awareness of deficits;Problem solving;Executive functioning Arousal/Alertness: Lethargic Orientation Level: Disoriented to;Place;Time Behavior During Session: Lethargic Current Attention Level: Focused Memory: Decreased recall of precautions Memory Deficits: Needed repeated cues for safety Following Commands: Follows one step commands inconsistently Safety/Judgement: Decreased safety judgement for tasks assessed Problem Solving: delayed - basic functional Executive Functioning: poor self regulation    Extremity/Trunk Assessment Right Upper Extremity Assessment RUE ROM/Strength/Tone: Deficits RUE ROM/Strength/Tone Deficits: @ 3+/5 throughout RUE Sensation: WFL - Light Touch;WFL - Proprioception Left Upper Extremity Assessment LUE ROM/Strength/Tone: Deficits LUE ROM/Strength/Tone Deficits: @ 4/5 throughout LUE Sensation: WFL - Light Touch;WFL - Proprioception Trunk Assessment Trunk Assessment:  Normal   Mobility Bed Mobility Bed Mobility: Sit to Supine;Scooting to HOB Sit to Supine: 1: +2 Total assist;HOB elevated Sit to Supine: Patient Percentage: 20% Scooting to HOB: 1: +2 Total assist Scooting to [facility]: Patient Percentage: 20% Details for Bed Mobility Assistance: unable to use rails with vc Transfers Transfers: Sit to Stand;Stand to Sit Sit to Stand: 1: +2 Total assist;From chair/3-in-1 Sit to Stand: Patient Percentage: 20% Stand to Sit: 1: +2 Total assist;With upper extremity assist;To bed Stand to Sit: Patient Percentage: 30% Details for Transfer Assistance: Repeated verbal cues for safe hand placement.  Patient stood x2 with max assist to initiate standing.  Able to stand for 30-40 seconds each time.  Unable to take a step with RLE with use of RW.  Patient then instructed in and performed transfer bed to chair with +2 assist without AD.  Mod assist to scoot hips back into chair.   Exercise    Balance  poor static sitting  End of Session OT - End of Session Equipment Utilized During Treatment: Gait belt Activity Tolerance: Patient limited by fatigue;Other (comment) (cognitive status) Patient left: in bed;with call bell/phone within reach;with family/visitor present Nurse Communication: Other (comment) (change in status)  GO     Gordie Crumby,HILLARY 07/22/2011, 7:26 PM [facility], OTR/L  [phone] 07/22/2011

## 2011-07-22 NOTE — Progress Notes (Signed)
Patient ID: Alan [name], male   DOB: 02/12/1931, 78 y.o.   MRN: [id number]   [facility] Progress Note    Subjective:   Reports a "rough night" with stump site pain. Stump site dressing removed this AM   Objective:   BP 123/65  Pulse 85  Temp 98.9 F (37.2 C) (Oral)  Resp 20  Ht 6' (1.829 m)  Wt 103.3 kg (227 lb 11.8 oz)  BMI 30.89 kg/m2  SpO2 95%  Physical Exam: NWG:NFAOZHYQMVH resting in bed QIO:NGEXB RRR, normal S1 and S2 Resp:CTA bilaterally, no rales/rhonchi MWU:XLKG, obese, NT, BS normal MWN:UUVO BKA wound site well apposed with staples- no drainage/erythema  Labs: BMET  Lab 07/21/11 0758 07/20/11 1859 07/20/11 0907  NA 131* -- 138  K 5.1 -- 4.9  CL 92* -- 93*  CO2 28 -- 33*  GLUCOSE 207* -- 110*  BUN 42* -- 29*  CREATININE 7.61* 6.50* 6.10*  ALB -- -- --  CALCIUM 8.8 -- 9.6  PHOS -- -- --   CBC  Lab 07/21/11 0758 07/20/11 1859 07/20/11 0907  WBC 10.6* 10.4 11.6*  NEUTROABS -- -- --  HGB 9.3* 11.5* 12.3*  HCT 29.5* 36.3* 39.3  MCV 91.3 92.4 92.9  PLT 228 255 241     Medications:      . allopurinol  150 mg Oral Daily  . aspirin EC  81 mg Oral Daily  . calcium carbonate  1 tablet Oral TID AC  . darbepoetin (ARANESP) injection - DIALYSIS  150 mcg Intravenous Q Sat-HD  . docusate sodium  100 mg Oral Daily  . enoxaparin (LOVENOX) injection  30 mg Subcutaneous Q24H  . ezetimibe-simvastatin  1 tablet Oral QHS  . glimepiride  2 mg Oral QAC breakfast  . insulin aspart  0-15 Units Subcutaneous TID WC  . metoprolol tartrate  37.5 mg Oral BID  . pantoprazole  40 mg Oral Q1200  . paricalcitol  4 mcg Intravenous Q T,Th,Sa-HD  . silver sulfADIAZINE  1 application Topical Daily  . warfarin  2.5 mg Oral Custom  . warfarin  5 mg Oral Custom  . Warfarin - Physician Dosing Inpatient   Does not apply q1800     Assessment/ Plan:   1.S/P Left BKA: POD #2, Wound appears to be healing well-now s/p removal of dressing. Anticipate DC to  rehab/SNF.  2. ESRD: Continue HD on a TTS schedule and re-evaluate EDW with recent BKA.  3. Anemia: Restarted ESA with expected post-op drop in hemoglobin from surgical losses. 4. Metabolic Bone disease: Restarted on OP doses of Tums and Zemplar  5. Hypertension: Monitor BP trend on metoprolol- sporadic elevations likely from pain.    Zetta Bills, MD 07/22/2011, 9:15 AM

## 2011-07-22 NOTE — Evaluation (Signed)
Physical Therapy Evaluation Patient Details Name: Alan [name] MRN: [id number] DOB: 1931-03-25 Today's Date: 07/22/2011 Time: 5284-1324 PT Time Calculation (min): 28 min  PT Assessment / Plan / Recommendation Clinical Impression  Patient is a 78 yo male now s/p left BKA.  Patient with general weakness and increased pain, impacting functional mobility.  Patient has 24 hour assist at home if he can reach supervision level.  Recommend Inpatient Rehab consult to maximize functional independence for return home with wife.  Will follow patient acutely for mobility training and education.    PT Assessment  Patient needs continued PT services    Follow Up Recommendations  Inpatient Rehab    Barriers to Discharge        Equipment Recommendations  Defer to next venue    Recommendations for Other Services Rehab consult   Frequency Min 4X/week    Precautions / Restrictions Precautions Precautions: Fall Precaution Comments: Patient fearful of falling Restrictions Weight Bearing Restrictions: No         Mobility  Bed Mobility Bed Mobility: Rolling Left;Left Sidelying to Sit;Sitting - Scoot to Edge of Bed Rolling Left: With rail;4: Min assist Left Sidelying to Sit: 3: Mod assist;With rails;HOB elevated Sitting - Scoot to Edge of Bed: 4: Min assist Details for Bed Mobility Assistance: Verbal cues for technique and to use rail.  Required assist to lift trunk from bed. Transfers Transfers: Sit to Stand;Stand to [employer] Transfers Sit to Stand: 2: Max assist;With upper extremity assist;From bed Stand to Sit: 3: Mod assist;With upper extremity assist;To bed;With armrests;To chair/3-in-1 Stand Pivot Transfers: 1: +2 Total assist Stand Pivot Transfers: Patient Percentage: 60% Details for Transfer Assistance: Repeated verbal cues for safe hand placement.  Patient stood x2 with max assist to initiate standing.  Able to stand for 30-40 seconds each time.  Unable to take a step with  RLE with use of RW.  Patient then instructed in and performed transfer bed to chair with +2 assist without AD.  Mod assist to scoot hips back into chair.    Exercises     PT Diagnosis: Difficulty walking;Generalized weakness;Acute pain  PT Problem List: Decreased strength;Decreased activity tolerance;Decreased balance;Decreased mobility;Decreased knowledge of use of DME;Decreased safety awareness;Pain PT Treatment Interventions: DME instruction;Gait training;Functional mobility training;Therapeutic activities;Therapeutic exercise;Patient/family education   PT Goals Acute Rehab PT Goals PT Goal Formulation: With patient/family Time For Goal Achievement: 08/05/11 Potential to Achieve Goals: Good Pt will go Supine/Side to Sit: with supervision;with HOB 0 degrees PT Goal: Supine/Side to Sit - Progress: Goal set today Pt will go Sit to Supine/Side: with supervision;with HOB 0 degrees PT Goal: Sit to Supine/Side - Progress: Goal set today Pt will go Sit to Stand: with supervision;with upper extremity assist PT Goal: Sit to Stand - Progress: Goal set today Pt will go Stand to Sit: with supervision;with upper extremity assist PT Goal: Stand to Sit - Progress: Goal set today Pt will Transfer Bed to Chair/Chair to Bed: with supervision PT Transfer Goal: Bed to Chair/Chair to Bed - Progress: Goal set today Pt will Ambulate: 16 - 50 feet;with supervision;with rolling walker PT Goal: Ambulate - Progress: Goal set today  Visit Information  Last PT Received On: 07/22/11 Assistance Needed: +2    Subjective Data  Subjective: "My surgery was yesterday, and you want me up today?" (Surgery was day before yesterday.) Patient Stated Goal: To be able to stand   Prior Functioning  Home Living Lives With: Spouse Available Help at Discharge: Family;Available 24 hours/day (Wife  unable to physically assist patient with mobility.) Type of Home: House Home Access: Stairs to enter [employer] of  Steps: 2 Entrance Stairs-Rails: Right;Left Home Layout: One level Bathroom Shower/Tub: Health visitor: Standard Bathroom Accessibility: Yes How Accessible: Accessible via walker Home Adaptive Equipment: Bedside commode/3-in-1;Straight cane;Walker - rolling Prior Function Level of Independence: Needs assistance Needs Assistance: Bathing;Meal Prep;Light Housekeeping Bath: Minimal Meal Prep: Total Light Housekeeping: Total Able to Take Stairs?: Yes Driving: No Vocation: Retired Musician: No difficulties    Cognition  Overall Cognitive Status: Impaired Area of Impairment: Memory;Safety/judgement;Problem solving Arousal/Alertness: Awake/alert Orientation Level: Disoriented to;Time Behavior During Session: WFL for tasks performed Memory Deficits: Needed repeated cues for safety Safety/Judgement: Decreased safety judgement for tasks assessed Problem Solving: Increased time to complete tasks.  Difficulty using RW with standing    Extremity/Trunk Assessment Right Upper Extremity Assessment RUE ROM/Strength/Tone: [facility] for tasks assessed Left Upper Extremity Assessment LUE ROM/Strength/Tone: WFL for tasks assessed (General UE weakness noted) Right Lower Extremity Assessment RLE ROM/Strength/Tone: Deficits RLE ROM/Strength/Tone Deficits: General weakness 4-/5.  Wounds on foot Left Lower Extremity Assessment LLE ROM/Strength/Tone: Deficits LLE ROM/Strength/Tone Deficits: Able to lift LE against gravity.  Able to extend knee fully   Balance    End of Session PT - End of Session Equipment Utilized During Treatment: Gait belt Activity Tolerance: Patient limited by fatigue;Patient limited by pain Patient left: in chair;with call bell/phone within reach;with family/visitor present Nurse Communication: Mobility status  GP     Vena Austria 07/22/2011, 11:12 AM Durenda Hurt. Renaldo Fiddler, [facility] Acute Rehab Services Pager [phone]

## 2011-07-22 NOTE — Progress Notes (Addendum)
07/22/2011 8:35 AM POD 2  Subjective:  Pt mad and wants to know who has locked him up in here.    100-150s systolic  HR 80s this am was up to 120s yesterday afternoon  irreg   Tm 101 now 98.9  95%RA Filed Vitals:   07/22/11 0536  BP: 123/65  Pulse: 85  Temp: 98.9 F (37.2 C)  Resp: 20    Physical Exam: Incisions:  C/d/i with some dried blood around staples.  No active bleeding. Extremities:  No erythema or edema.  CBC    Component Value Date/Time   WBC 10.6* 07/21/2011 0758   RBC 3.23* 07/21/2011 0758   HGB 9.3* 07/21/2011 0758   HCT 29.5* 07/21/2011 0758   PLT 228 07/21/2011 0758   MCV 91.3 07/21/2011 0758   MCH 28.8 07/21/2011 0758   MCHC 31.5 07/21/2011 0758   RDW 13.6 07/21/2011 0758   LYMPHSABS 0.8 05/18/2011 1547   MONOABS 0.2 05/18/2011 1547   EOSABS 0.0 05/18/2011 1547   BASOSABS 0.0 05/18/2011 1547    BMET    Component Value Date/Time   NA 131* 07/21/2011 0758   K 5.1 07/21/2011 0758   CL 92* 07/21/2011 0758   CO2 28 07/21/2011 0758   GLUCOSE 207* 07/21/2011 0758   BUN 42* 07/21/2011 0758   CREATININE 7.61* 07/21/2011 0758   CALCIUM 8.8 07/21/2011 0758   CALCIUM 8.8 10/27/2010 0734   GFRNONAA 6* 07/21/2011 0758   GFRAA 7* 07/21/2011 0758    INR    Component Value Date/Time   INR 1.36 07/21/2011 0758   INR 3.18 02/06/2011     Intake/Output Summary (Last 24 hours) at 07/22/11 0835 Last data filed at 07/21/11 1700  Gross per 24 hour  Intake    120 ml  Output   3000 ml  Net  -2880 ml     Assessment/Plan:  78 y.o. male is s/p left below amputation  POD 2  -pt wanting to use restroom-will check back in a bit. -will order limb guard from bio tech -left dressing off for Dr. Edilia Bo to see. -mobilize with PT -CIR consulted -continue coumadin per pharmacy   Doreatha Massed, PA-C [facility] [phone] 07/22/2011 8:35 AM  Agree with above L BKA looks fine Begin dressing changes. PTx/ CIR  Di Kindle. Edilia Bo, MD, FACS [employer]  [phone] 07/22/2011

## 2011-07-23 ENCOUNTER — Encounter (HOSPITAL_COMMUNITY): Payer: Self-pay | Admitting: Surgery

## 2011-07-23 DIAGNOSIS — L98499 Non-pressure chronic ulcer of skin of other sites with unspecified severity: Secondary | ICD-10-CM

## 2011-07-23 DIAGNOSIS — S88119A Complete traumatic amputation at level between knee and ankle, unspecified lower leg, initial encounter: Secondary | ICD-10-CM

## 2011-07-23 DIAGNOSIS — I739 Peripheral vascular disease, unspecified: Secondary | ICD-10-CM

## 2011-07-23 LAB — CBC
HCT: 29.9 % — ABNORMAL LOW (ref 39.0–52.0)
MCHC: 31.4 g/dL (ref 30.0–36.0)
Platelets: 251 10*3/uL (ref 150–400)
RDW: 13.5 % (ref 11.5–15.5)
WBC: 10 10*3/uL (ref 4.0–10.5)

## 2011-07-23 LAB — RENAL FUNCTION PANEL
Albumin: 2.7 g/dL — ABNORMAL LOW (ref 3.5–5.2)
BUN: 44 mg/dL — ABNORMAL HIGH (ref 6–23)
Chloride: 93 mEq/L — ABNORMAL LOW (ref 96–112)
GFR calc Af Amer: 6 mL/min — ABNORMAL LOW (ref >90)
GFR calc non Af Amer: 5 mL/min — ABNORMAL LOW (ref >90)
Phosphorus: 4.2 mg/dL (ref 2.3–4.6)
Potassium: 4.6 mEq/L (ref 3.5–5.1)
Sodium: 133 mEq/L — ABNORMAL LOW (ref 135–145)

## 2011-07-23 LAB — GLUCOSE, CAPILLARY: Glucose-Capillary: 219 mg/dL — ABNORMAL HIGH (ref 70–99)

## 2011-07-23 LAB — PROTIME-INR: Prothrombin Time: 17.6 seconds — ABNORMAL HIGH (ref 11.6–15.2)

## 2011-07-23 MED ORDER — HEPARIN SODIUM (PORCINE) 1000 UNIT/ML DIALYSIS
2000.0000 [IU] | INTRAMUSCULAR | Status: DC | PRN
  Filled 2011-07-23: qty 2

## 2011-07-23 NOTE — Progress Notes (Signed)
Occupational Therapy Treatment Patient Details Name: Alan [name] MRN: [id number] DOB: 06/14/1931 Today's Date: 07/23/2011 Time: 4098-1191 OT Time Calculation (min): 16 min  OT Assessment / Plan / Recommendation Comments on Treatment Session Good participation in therapy today.  Alert, but slow to process and follow commands.    Follow Up Recommendations       Barriers to Discharge       Equipment Recommendations  Defer to next venue    Recommendations for Other Services    Frequency     Plan Discharge plan remains appropriate    Precautions / Restrictions Precautions Precautions: Fall Precaution Comments: Patient fearful of falling Restrictions Weight Bearing Restrictions: Yes LLE Weight Bearing: Non weight bearing   Pertinent Vitals/Pain No c/o pain    ADL  Grooming: Performed;Wash/dry face;Wash/dry hands;Set up Where Assessed - Grooming: Supported sitting ADL Comments: Pt with slow processing speed with grooming and UB exercise tasks.    OT Diagnosis:    OT Problem List:   OT Treatment Interventions:     OT Goals ADL Goals Pt Will Perform Grooming: with set-up;Sitting, edge of bed;Unsupported ADL Goal: Grooming - Progress: Progressing toward goals Arm Goals Pt Will Complete Theraband Exer: to increase strength;Bilateral upper extremities;2 sets;Level 2 Theraband;with minimal assist Arm Goal: Theraband Exercises - Progress: Progressing toward goal  Visit Information  Last OT Received On: 07/23/11 Assistance Needed: +2    Subjective Data      Prior Functioning       Cognition  Overall Cognitive Status: Impaired Area of Impairment: Memory;Following commands;Safety/judgement Arousal/Alertness: Awake/alert Orientation Level: Appears intact for tasks assessed Behavior During Session: [facility] for tasks performed Current Attention Level: Sustained Following Commands: Other (comment) (needs visual and verbal cues)    Mobility   Exercises  Level 3  theraband exercises x 10 reps each, shoulder, elbow.  Balance Balance Balance Assessed: Yes Static Sitting Balance Static Sitting - Balance Support: Bilateral upper extremity supported Static Sitting - Level of Assistance: 5: Stand by assistance  End of Session OT - End of Session Activity Tolerance: Patient tolerated treatment well Patient left: in chair;with call bell/phone within reach;with family/visitor present  GO     Evern Bio 07/23/2011, 2:51 PM [phone]

## 2011-07-23 NOTE — Progress Notes (Signed)
Subjective: Alert, no complaints.   Objective Vital signs in last 24 hours: Filed Vitals:   07/22/11 1313 07/22/11 1941 07/23/11 0408 07/23/11 0700  BP: 98/52 107/63 93/47 104/58  Pulse: 82 89 83   Temp: 98.1 F (36.7 C) 99.8 F (37.7 C) 99.1 F (37.3 C)   TempSrc: Oral Oral Oral   Resp: 18 20 18    Height:      Weight:      SpO2: 98% 96% 96%    Weight change:   Intake/Output Summary (Last 24 hours) at 07/23/11 1110 Last data filed at 07/22/11 1300  Gross per 24 hour  Intake    240 ml  Output      0 ml  Net    240 ml   Labs: Basic Metabolic Panel:  Lab 07/21/11 1610 07/20/11 1859 07/20/11 0907  NA 131* -- 138  K 5.1 -- 4.9  CL 92* -- 93*  CO2 28 -- 33*  GLUCOSE 207* -- 110*  BUN 42* -- 29*  CREATININE 7.61* 6.50* 6.10*  ALB -- -- --  CALCIUM 8.8 -- 9.6  PHOS -- -- --   Liver Function Tests: No results found for this basename: AST:3,ALT:3,ALKPHOS:3,BILITOT:3,PROT:3,ALBUMIN:3 in the last 168 hours No results found for this basename: LIPASE:3,AMYLASE:3 in the last 168 hours No results found for this basename: AMMONIA:3 in the last 168 hours CBC:  Lab 07/21/11 0758 07/20/11 1859 07/20/11 0907  WBC 10.6* 10.4 11.6*  NEUTROABS -- -- --  HGB 9.3* 11.5* 12.3*  HCT 29.5* 36.3* 39.3  MCV 91.3 92.4 92.9  PLT 228 255 241   PT/INR: @labrcntip (inr:5) Cardiac Enzymes: No results found for this basename: CKTOTAL:5,CKMB:5,CKMBINDEX:5,TROPONINI:5 in the last 168 hours CBG:  Lab 07/22/11 2041 07/22/11 1614 07/22/11 1102 07/22/11 0553 07/21/11 2117  GLUCAP 169* 140* 170* 166* 129*    Iron Studies: No results found for this basename: IRON:30,TIBC:30,TRANSFERRIN:30,FERRITIN:30 in the last 168 hours  Physical Exam:  Blood pressure 104/58, pulse 83, temperature 99.1 F (37.3 C), temperature source Oral, resp. rate 18, height 6' (1.829 m), weight 103.3 kg (227 lb 11.8 oz), SpO2 96.00%.  RUE:[id number] resting in bed  NWG:NFAOZ RRR, normal S1 and S2  Resp:CTA  bilaterally, no rales/rhonchi  HYQ:MVHQ, obese, NT, BS normal  ION:GEXB BKA wound site well apposed with staples- no drainage/erythema, RUA AVF +bruit  Dialysis Prescription: 4 1/4hr, EDW 108.5, QB 500, QD AF 1.5, 2K/2.25Ca. RUA AVF 15G needles. Heparin 3400 Units. Zemplar TIW, No epogen. Venofer 100mg  IV Q 2weekly.  Assessment/Plan 1. S/P Left BKA, 7/13- POD #2.  Anticipate DC to rehab/SNF.  2. ESRD: Continue HD on a TTS schedule and re-evaluate EDW with recent BKA. Previous EDW was 108.5kg, post op 103kg. HD tomorrow. Check labs today.  3. Anemia: Restarted ESA with expected post-op drop in hemoglobin from surgical losses.  4. Metabolic Bone disease- restarted on OP doses of Tums and Zemplar  5. Hypertension-  getting metoprolol tartrate 37.5 bid as at home- sporadic elevations likely from pain   Vinson Moselle  MD [facility] [phone] pgr    [phone] cell 07/23/2011, 11:10 AM

## 2011-07-23 NOTE — Progress Notes (Signed)
Physical Therapy Treatment Patient Details Name: EON ZUNKER MRN: [id number] DOB: 1931-06-18 Today's Date: 07/23/2011 Time: 4098-1191 PT Time Calculation (min): 36 min  PT Assessment / Plan / Recommendation Comments on Treatment Session  Pt continues to struggle with bed mobility and transfers.  Pt had bowel movement during 1st stand pivot transfer.     Follow Up Recommendations  Inpatient Rehab    Barriers to Discharge        Equipment Recommendations  Defer to next venue    Recommendations for Other Services Rehab consult  Frequency Min 4X/week   Plan Discharge plan remains appropriate;Frequency remains appropriate    Precautions / Restrictions Precautions Precautions: Fall Precaution Comments: Patient fearful of falling Restrictions Weight Bearing Restrictions: Yes LLE Weight Bearing: Non weight bearing   Pertinent Vitals/Pain 7/10 Rt residual limb pain.  RN notified.      Mobility  Bed Mobility Bed Mobility: Sitting - Scoot to Edge of Bed;Rolling Left;Left Sidelying to Sit Rolling Left: 4: Min assist;With rail Left Sidelying to Sit: With rails;HOB flat;2: Max assist Sitting - Scoot to Edge of Bed: 3: Mod assist Details for Bed Mobility Assistance: Cueing for techniques, assist to raise shoulder/trunk from bed, assist to scoot hips forward.  Transfers Transfers: Sit to Stand;Stand to Sit;Stand Pivot Transfers Sit to Stand: 1: +2 Total assist;From chair/3-in-1 Sit to Stand: Patient Percentage: 30% Stand to Sit: 1: +2 Total assist;With upper extremity assist;To bed Stand to Sit: Patient Percentage: 30% Stand Pivot Transfers: 1: +2 Total assist Stand Pivot Transfers: Patient Percentage: 20% Details for Transfer Assistance: Continued need for repeated instruction on hand placement, Assist to initate standing, Unable to stand for more than 30 seconds on each trial  Pt total assist to perform stand pivot transfer bending at the waist and unable to support body wt  through UEs.  Ambulation/Gait Ambulation/Gait Assistance: Not tested (comment)    Exercises     PT Diagnosis:    PT Problem List:   PT Treatment Interventions:     PT Goals Acute Rehab PT Goals PT Goal Formulation: With patient/family Time For Goal Achievement: 08/05/11 Potential to Achieve Goals: Good Pt will go Supine/Side to Sit: with supervision;with HOB 0 degrees PT Goal: Supine/Side to Sit - Progress: Progressing toward goal Pt will go Sit to Supine/Side: with supervision;with HOB 0 degrees Pt will go Sit to Stand: with supervision;with upper extremity assist PT Goal: Sit to Stand - Progress: Progressing toward goal Pt will go Stand to Sit: with supervision;with upper extremity assist PT Goal: Stand to Sit - Progress: Progressing toward goal Pt will Transfer Bed to Chair/Chair to Bed: with supervision PT Transfer Goal: Bed to Chair/Chair to Bed - Progress: Progressing toward goal Pt will Ambulate: 16 - 50 feet;with supervision;with rolling walker PT Goal: Ambulate - Progress: Not met  Visit Information  Last PT Received On: 07/23/11    Subjective Data  Subjective: I need to use the bathroom Patient Stated Goal: To be able to stand   Cognition  Overall Cognitive Status: Appears within functional limits for tasks assessed/performed Arousal/Alertness: Awake/alert Orientation Level: Appears intact for tasks assessed Behavior During Session: [facility] for tasks performed    Balance  Balance Balance Assessed: Yes Static Sitting Balance Static Sitting - Balance Support: Bilateral upper extremity supported Static Sitting - Level of Assistance: 5: Stand by assistance  End of Session PT - End of Session Equipment Utilized During Treatment: Gait belt Activity Tolerance: Patient limited by fatigue;Patient limited by pain Patient left: in chair;with  call bell/phone within reach;with family/visitor present Nurse Communication: Mobility status;Need for lift equipment   GP      Shelvie Salsberry 07/23/2011, 1:33 PM

## 2011-07-23 NOTE — Progress Notes (Signed)
UR COMPLETED  

## 2011-07-23 NOTE — Consult Note (Signed)
Physical Medicine and Rehabilitation Consult Reason for Consult: Left BKA Referring Physician:  [name]   HPI: Alan [name] is a 77 y.o. male with ESRD, DM, BLE ulcers with PVD and admitted on 07/20/11 for L-BKA by [name].  Post op hemodialysis ongoing.  Pain control remains an issue.  Therapy evaluations done yesterday and CIR recommended.   Review of Systems  HENT: Negative for hearing loss.   Eyes: Negative for blurred vision.  Respiratory: Negative for shortness of breath.   Cardiovascular: Negative for chest pain and palpitations.  Gastrointestinal: Positive for constipation.       Rectal pain-hems problems  Musculoskeletal: Positive for back pain.  Neurological: Negative for headaches.  All other systems reviewed and are negative.   Past Medical History  Diagnosis Date  . Diabetes mellitus   . Gout   . Hypertension   . Hyperlipidemia   . Chronic kidney disease     Started dialysis October 2012  . Arthritis   . Prostate cancer     s/p seed implant  . Cataract     BILATERAL-BEEN REMOVED  . Neuromuscular disorder     DABETIC NEUROPATHY-LOWER EXTREMITY  . Atrial fibrillation     on coumadin  . Aortic stenosis     moderate to severe, not felt to be a surgical candidate  . Anemia     secondary to end stage renal disease  . Coronary artery disease     remote CABG in 1996; last stress test in 2005; last cath 2005  . Diabetic neuropathy   . Edema   . PAD (peripheral artery disease)   . Diabetic retinopathy   . Chronic anticoagulation     on coumadin  . Nephrolithiasis     prior history of percutaneous nephrostomy  . Shortness of breath    Past Surgical History  Procedure Date  . Ventral hernia repair   . Inguinal hernia repair     right side  . Colonoscopy   . Kidney stone surgery   . Polypectomy   . Tonsillectomy   . Toe surgery     removal little toe right foot  . Cardiac catheterization 04/19/2003    SEVERE 2 VESSEL OBSTRUCTIVE  ATHERSCLEROTIC CAD  . Coronary artery bypass graft 1996    LIMA GRAFT TO THE LAD, SEQUENTIAL SAPHENOUS  VEIN GRAFT TO THE FIRST AND SECOND DIAGONIAL BRANCHES, SEQUENTIAL SAPHENOUS VEIN GRAFT TO THE ACUTE MARGINAL, POSTERIOR DESCENDING, AND POSTERIOR LATERAL BRANCHES OF THE RIGHT CORONARY ARTERY  . Radioactive seed implant   . US echocardiography 09/06/2009    EF 55-60%  . Cardiovascular stress test 04/13/2003    EF 54%. EVIDENCE OF ANTERO-APICAL ISCHEMIA. NORMAL LV SIZE AND FUNCTION  . Tee without cardioversion 05/09/2011    Procedure: TRANSESOPHAGEAL ECHOCARDIOGRAM (TEE);  Surgeon: Laurey Morale, MD;  Location: [facility] ENDOSCOPY;  Service: Cardiovascular;  Laterality: N/A;  . Hernia repair    Family History  Problem Relation Age of Onset  . Heart disease Father   . Hypertension Brother   . Stroke Brother    Social History:  Married.  Used to work for the [employer].-disabled due to back problems Sedentary due to walking problems. Use Rolator. Needed assist with ADLs.  He reports that he quit smoking about 43 years ago. His smoking use included Cigarettes. He quit after 2 years of use. He has quit using smokeless tobacco. His smokeless tobacco use included Chew. He reports that he does not drink alcohol or use illicit drugs.  Allergies  Allergen Reactions  . Ancef (Cefazolin) Rash   Medications Prior to Admission  Medication Sig Dispense Refill  . allopurinol (ZYLOPRIM) 300 MG tablet Take 150 mg by mouth daily.      [name] aspirin EC 81 MG tablet Take 81 mg by mouth daily.      . calcium carbonate (TUMS - DOSED IN MG ELEMENTAL CALCIUM) 500 MG chewable tablet Chew 1 tablet by mouth 3 (three) times daily before meals. Phosphorous binder      . estazolam (PROSOM) 2 MG tablet Take 1 mg by mouth at bedtime as needed. For sleep      . ezetimibe-simvastatin (VYTORIN) 10-40 MG per tablet Take 1 tablet by mouth at bedtime.  30 tablet  0  . glimepiride (AMARYL) 4 MG tablet Take 2 mg by mouth daily before  breakfast.        . metoprolol tartrate (LOPRESSOR) 25 MG tablet Take 37.5 mg by mouth 2 (two) times daily. Hold for systolic blood pressure(top number) less than 110 ,and heart rate less than 60, take 1/2 tablet by mouth bid      . silver sulfADIAZINE (SILVADENE) 1 % cream Apply 1 application topically daily.      . traMADol (ULTRAM) 50 MG tablet Take 50 mg by mouth 2 (two) times daily as needed. pain      . warfarin (COUMADIN) 5 MG tablet Take 2.5-5 mg by mouth daily. Pt takes 2.5mg  Mon, Wed, Fri, Sat and 5mg  Tues, Thurs, Sun      . HYDROcodone-acetaminophen (VICODIN) 5-500 MG per tablet Take 1 tablet by mouth every 6 (six) hours as needed. For pain      . Nutritional Supplements (FEEDING SUPPLEMENT, NEPRO CARB STEADY,) LIQD Take 237 mLs by mouth 3 (three) times daily as needed.        Home: Home Living Lives With: Spouse Available Help at Discharge: Family;Available 24 hours/day (Wife unable to physically assist patient with mobility.) Type of Home: House Home Access: Stairs to enter [employer] of Steps: 2 Entrance Stairs-Rails: Right;Left Home Layout: One level Bathroom Shower/Tub: Health visitor: Standard Bathroom Accessibility: Yes How Accessible: Accessible via walker Home Adaptive Equipment: Bedside commode/3-in-1;Straight cane;Walker - rolling Additional Comments: son can build a ramp  Functional History: Prior Function Bath: Minimal Meal Prep: Total Light Housekeeping: Total Able to Take Stairs?: Yes Driving: No Vocation: Retired Functional Status:  Mobility: Bed Mobility Bed Mobility: Sit to Supine;Scooting to [facility] Rolling Left: With rail;4: Min assist Left Sidelying to Sit: 3: Mod assist;With rails;HOB elevated Sitting - Scoot to Edge of Bed: 4: Min assist Sit to Supine: 1: +2 Total assist;HOB elevated Sit to Supine: Patient Percentage: 20% Scooting to HOB: 1: +2 Total assist Scooting to [facility]: Patient Percentage:  20% Transfers Transfers: Sit to Stand;Stand to [employer] Transfers Sit to Stand: 1: +2 Total assist;From chair/3-in-1 Sit to Stand: Patient Percentage: 20% Stand to Sit: 1: +2 Total assist;With upper extremity assist;To bed Stand to Sit: Patient Percentage: 30% Stand Pivot Transfers: 1: +2 Total assist Stand Pivot Transfers: Patient Percentage: 60%      ADL: ADL Eating/Feeding: Simulated;Set up Where Assessed - Eating/Feeding: Chair Grooming: Simulated;Minimal assistance Where Assessed - Grooming: Supported sitting Upper Body Bathing: Simulated;Moderate assistance Where Assessed - Upper Body Bathing: Supported sitting Lower Body Bathing: Simulated;+1 Total assistance Where Assessed - Lower Body Bathing: Supine, head of bed up;Rolling right and/or left Upper Body Dressing: Simulated;Maximal assistance Where Assessed - Upper Body Dressing: Unsupported sitting Lower Body Dressing:  Simulated;+1 Total assistance Where Assessed - Lower Body Dressing: Supine, head of bed up;Rolling right and/or left Toilet Transfer: Simulated;+2 Total assistance Toilet Transfer Method: Stand pivot Toilet Transfer Equipment: Other (comment) (bed - chair) Equipment Used: Gait belt Transfers/Ambulation Related to ADLs: +2 total A. non ambulatory ADL Comments: ADL affected by cognition  Cognition: Cognition Arousal/Alertness: Lethargic Orientation Level: Oriented X4 Cognition Overall Cognitive Status: Impaired Area of Impairment: Memory;Attention;Following commands;Safety/judgement;Awareness of deficits;Problem solving;Executive functioning Arousal/Alertness: Lethargic Orientation Level: Disoriented to;Place;Time Behavior During Session: Lethargic Current Attention Level: Focused Memory: Decreased recall of precautions Memory Deficits: Needed repeated cues for safety Following Commands: Follows one step commands inconsistently Safety/Judgement: Decreased safety judgement for tasks  assessed Problem Solving: delayed - basic functional Executive Functioning: poor self regulation  Blood pressure 104/58, pulse 83, temperature 99.1 F (37.3 C), temperature source Oral, resp. rate 18, height 6' (1.829 m), weight 103.3 kg (227 lb 11.8 oz), SpO2 96.00%. Physical Exam  Nursing note and vitals reviewed. Constitutional: He appears well-developed and well-nourished.  HENT:  Head: Normocephalic and atraumatic.  Eyes: Pupils are equal, round, and reactive to light.  Neck: Normal range of motion.  Cardiovascular: Normal rate and regular rhythm.   Pulmonary/Chest: Effort normal and breath sounds normal.  Abdominal: Soft. Bowel sounds are normal.  Neurological: No cranial nerve deficit.  Reflex Scores:      Tricep reflexes are 1+ on the right side.      Bicep reflexes are 1+ on the right side and 1+ on the left side.      Brachioradialis reflexes are 1+ on the right side and 1+ on the left side.      Patellar reflexes are 1+ on the right side and 1+ on the left side.      Achilles reflexes are 1+ on the right side and 1+ on the left side.      Some mild short-term memory deficits. Otherwise appropriate. Upper extremity strength grossly 4/5. Right lower extremity 3-4/5. Left limb can be lifted with gravity eliminated.  Psychiatric: He has a normal mood and affect. His behavior is normal.    Results for orders placed during the hospital encounter of 07/20/11 (from the past 24 hour(s))  GLUCOSE, CAPILLARY     Status: Abnormal   Collection Time   07/22/11 11:02 AM      Component Value Range   Glucose-Capillary 170 (*) 70 - 99 mg/dL  GLUCOSE, CAPILLARY     Status: Abnormal   Collection Time   07/22/11  4:14 PM      Component Value Range   Glucose-Capillary 140 (*) 70 - 99 mg/dL  GLUCOSE, CAPILLARY     Status: Abnormal   Collection Time   07/22/11  8:41 PM      Component Value Range   Glucose-Capillary 169 (*) 70 - 99 mg/dL   Comment 1 Notify RN    PROTIME-INR     Status:  Abnormal   Collection Time   07/23/11  6:05 AM      Component Value Range   Prothrombin Time 17.6 (*) 11.6 - 15.2 seconds   INR 1.42  0.00 - 1.49   No results found.  Assessment/Plan: Diagnosis: Left below-knee amputation 1. Does the need for close, 24 hr/day medical supervision in concert with the patient's rehab needs make it unreasonable for this patient to be served in a less intensive setting? Yes 2. Co-Morbidities requiring supervision/potential complications: Aortic stenosis, CAD, diabetes, atrial fibrillation 3. Due to bladder management, bowel management, safety, skin/wound  care, disease management, medication administration, pain management and patient education, does the patient require 24 hr/day rehab nursing? Yes 4. Does the patient require coordinated care of a physician, rehab nurse, PT (1-2 hrs/day, 5 days/week) and OT (1-2 hrs/day, 5 days/week) to address physical and functional deficits in the context of the above medical diagnosis(es)? Yes Addressing deficits in the following areas: balance, endurance, locomotion, strength, transferring, bowel/bladder control, bathing, dressing, feeding, grooming, toileting and psychosocial support 5. Can the patient actively participate in an intensive therapy program of at least 3 hrs of therapy per day at least 5 days per week? Yes 6. The potential for patient to make measurable gains while on inpatient rehab is excellent 7. Anticipated functional outcomes upon discharge from inpatient rehab are supervision to minimal assistance with PT, supervision to minimal assist with OT, . 8. Estimated rehab length of stay to reach the above functional goals is: 2 weeks 9. Does the patient have adequate social supports to accommodate these discharge functional goals? Yes and Potentially 10. Anticipated D/C setting: Home 11. Anticipated post D/C treatments: HH therapy 12. Overall Rehab/Functional Prognosis: excellent  RECOMMENDATIONS: This patient's  condition is appropriate for continued rehabilitative care in the following setting: CIR Patient has agreed to participate in recommended program. Yes Note that insurance prior authorization may be required for reimbursement for recommended care.  Comment: Rehabilitation nurse to followup.   Ranelle Oyster M.D. 07/23/2011

## 2011-07-23 NOTE — Progress Notes (Signed)
[facility] [place]  Postoperative Visit - Amputation  Date of Surgery: 07/20/2011 Procedure(s): AMPUTATION BELOW KNEE Left Surgeon: Surgeon(s): Nada Libman, MD POD: 3 Days Post-Op  Subjective Alan [name] is a 78 y.o. male who is S/P Left Procedure(s): AMPUTATION BELOW KNEE.  Pt.denies increased pain in the stump. The patient notes pain is well controlled. Pt. denies phantom pain.  Significant Diagnostic Studies: CBC Lab Results  Component Value Date   WBC 10.6* 07/21/2011   HGB 9.3* 07/21/2011   HCT 29.5* 07/21/2011   MCV 91.3 07/21/2011   PLT 228 07/21/2011    BMET    Component Value Date/Time   NA 131* 07/21/2011 0758   K 5.1 07/21/2011 0758   CL 92* 07/21/2011 0758   CO2 28 07/21/2011 0758   GLUCOSE 207* 07/21/2011 0758   BUN 42* 07/21/2011 0758   CREATININE 7.61* 07/21/2011 0758   CALCIUM 8.8 07/21/2011 0758   CALCIUM 8.8 10/27/2010 0734   GFRNONAA 6* 07/21/2011 0758   GFRAA 7* 07/21/2011 0758    COAG Lab Results  Component Value Date   INR 1.42 07/23/2011   INR 1.36 07/21/2011   INR 1.29 07/20/2011   No results found for this basename: PTT     Intake/Output Summary (Last 24 hours) at 07/23/11 0802 Last data filed at 07/22/11 1300  Gross per 24 hour  Intake    240 ml  Output      0 ml  Net    240 ml   No data found.    Physical Examination  BP Readings from Last 3 Encounters:  07/23/11 104/58  07/23/11 104/58  07/18/11 103/80   Temp Readings from Last 3 Encounters:  07/23/11 99.1 F (37.3 C) Oral  07/23/11 99.1 F (37.3 C) Oral  07/18/11 97.1 F (36.2 C)    SpO2 Readings from Last 3 Encounters:  07/23/11 96%  07/23/11 96%  07/18/11 99%   Pulse Readings from Last 3 Encounters:  07/23/11 83  07/23/11 83  07/18/11 81    Pt is A&Ox3  WDWN male with no complaints  Left amputation wound is clean, dry, no drainage and healing well.  There is good bone coverage in the stump Stump is warm and well perfused,  without drainage; without erythema   Assessment/plan:  Alan [name] is a 78 y.o. male who is s/p Left Procedure(s): AMPUTATION BELOW KNEE  The patient's stump is viable.  Follow-up 4 weeks from surgery  Will order sleeve from [employer] and await rehabilitation consult.  Thomasena Edis EMMA MAUREEN 8:02 AM 07/23/[phone]

## 2011-07-24 ENCOUNTER — Inpatient Hospital Stay (HOSPITAL_COMMUNITY): Payer: MEDICARE | Admitting: *Deleted

## 2011-07-24 ENCOUNTER — Inpatient Hospital Stay (HOSPITAL_COMMUNITY): Payer: MEDICARE

## 2011-07-24 ENCOUNTER — Inpatient Hospital Stay (HOSPITAL_COMMUNITY)
Admission: RE | Admit: 2011-07-24 | Discharge: 2011-08-09 | DRG: 945 | Disposition: A | Discharge status: Home Health | Payer: MEDICARE | Source: Ambulatory Visit | Attending: Physical Medicine & Rehabilitation | Admitting: Physical Medicine & Rehabilitation

## 2011-07-24 ENCOUNTER — Encounter (HOSPITAL_COMMUNITY): Payer: Self-pay | Admitting: *Deleted

## 2011-07-24 DIAGNOSIS — K649 Unspecified hemorrhoids: Secondary | ICD-10-CM | POA: Diagnosis present

## 2011-07-24 DIAGNOSIS — I350 Nonrheumatic aortic (valve) stenosis: Secondary | ICD-10-CM | POA: Diagnosis present

## 2011-07-24 DIAGNOSIS — I4891 Unspecified atrial fibrillation: Secondary | ICD-10-CM | POA: Diagnosis present

## 2011-07-24 DIAGNOSIS — I2510 Atherosclerotic heart disease of native coronary artery without angina pectoris: Secondary | ICD-10-CM | POA: Diagnosis present

## 2011-07-24 DIAGNOSIS — M109 Gout, unspecified: Secondary | ICD-10-CM | POA: Diagnosis present

## 2011-07-24 DIAGNOSIS — I359 Nonrheumatic aortic valve disorder, unspecified: Secondary | ICD-10-CM | POA: Diagnosis present

## 2011-07-24 DIAGNOSIS — I70269 Atherosclerosis of native arteries of extremities with gangrene, unspecified extremity: Secondary | ICD-10-CM

## 2011-07-24 DIAGNOSIS — Z9889 Other specified postprocedural states: Secondary | ICD-10-CM

## 2011-07-24 DIAGNOSIS — I120 Hypertensive chronic kidney disease with stage 5 chronic kidney disease or end stage renal disease: Secondary | ICD-10-CM | POA: Diagnosis present

## 2011-07-24 DIAGNOSIS — S88119A Complete traumatic amputation at level between knee and ankle, unspecified lower leg, initial encounter: Secondary | ICD-10-CM

## 2011-07-24 DIAGNOSIS — Z5189 Encounter for other specified aftercare: Principal | ICD-10-CM

## 2011-07-24 DIAGNOSIS — L97509 Non-pressure chronic ulcer of other part of unspecified foot with unspecified severity: Secondary | ICD-10-CM | POA: Diagnosis present

## 2011-07-24 DIAGNOSIS — L98499 Non-pressure chronic ulcer of skin of other sites with unspecified severity: Secondary | ICD-10-CM | POA: Diagnosis present

## 2011-07-24 DIAGNOSIS — I739 Peripheral vascular disease, unspecified: Secondary | ICD-10-CM | POA: Diagnosis present

## 2011-07-24 DIAGNOSIS — Z87891 Personal history of nicotine dependence: Secondary | ICD-10-CM

## 2011-07-24 DIAGNOSIS — K5900 Constipation, unspecified: Secondary | ICD-10-CM | POA: Diagnosis present

## 2011-07-24 DIAGNOSIS — Z7982 Long term (current) use of aspirin: Secondary | ICD-10-CM

## 2011-07-24 DIAGNOSIS — D649 Anemia, unspecified: Secondary | ICD-10-CM | POA: Diagnosis present

## 2011-07-24 DIAGNOSIS — E119 Type 2 diabetes mellitus without complications: Secondary | ICD-10-CM | POA: Diagnosis present

## 2011-07-24 DIAGNOSIS — N186 End stage renal disease: Secondary | ICD-10-CM | POA: Diagnosis present

## 2011-07-24 DIAGNOSIS — Z992 Dependence on renal dialysis: Secondary | ICD-10-CM

## 2011-07-24 DIAGNOSIS — E785 Hyperlipidemia, unspecified: Secondary | ICD-10-CM | POA: Diagnosis present

## 2011-07-24 DIAGNOSIS — I4892 Unspecified atrial flutter: Secondary | ICD-10-CM | POA: Diagnosis present

## 2011-07-24 DIAGNOSIS — Z7901 Long term (current) use of anticoagulants: Secondary | ICD-10-CM

## 2011-07-24 LAB — PROTIME-INR: INR: 1.58 — ABNORMAL HIGH (ref 0.00–1.49)

## 2011-07-24 LAB — GLUCOSE, CAPILLARY: Glucose-Capillary: 272 mg/dL — ABNORMAL HIGH (ref 70–99)

## 2011-07-24 MED ORDER — DIPHENHYDRAMINE HCL 12.5 MG/5ML PO ELIX: 12.5000 mg | ORAL_SOLUTION | Freq: Four times a day (QID) | ORAL | Status: DC | PRN

## 2011-07-24 MED ORDER — METOPROLOL TARTRATE 25 MG PO TABS
37.5000 mg | ORAL_TABLET | Freq: Two times a day (BID) | ORAL | Status: DC
  Administered 2011-07-24 – 2011-08-01 (×7): 37.5 mg via ORAL
  Filled 2011-07-24 (×20): qty 1

## 2011-07-24 MED ORDER — TRAZODONE HCL 50 MG PO TABS
25.0000 mg | ORAL_TABLET | Freq: Every evening | ORAL | Status: DC | PRN
  Administered 2011-07-27 – 2011-08-07 (×7): 50 mg via ORAL
  Filled 2011-07-24 (×7): qty 1

## 2011-07-24 MED ORDER — PHENYLEPHRINE IN HARD FAT 0.25 % RE SUPP
1.0000 | Freq: Two times a day (BID) | RECTAL | Status: DC
  Filled 2011-07-24 (×2): qty 1

## 2011-07-24 MED ORDER — BISACODYL 10 MG RE SUPP
10.0000 mg | Freq: Every day | RECTAL | Status: DC | PRN
  Administered 2011-08-05: 10 mg via RECTAL
  Filled 2011-07-24: qty 1

## 2011-07-24 MED ORDER — GLIMEPIRIDE 2 MG PO TABS
2.0000 mg | ORAL_TABLET | Freq: Every day | ORAL | Status: DC
  Administered 2011-07-25 – 2011-08-09 (×16): 2 mg via ORAL
  Filled 2011-07-24 (×17): qty 1

## 2011-07-24 MED ORDER — WARFARIN SODIUM 2.5 MG PO TABS: 2.5000 mg | ORAL_TABLET | ORAL | Status: DC

## 2011-07-24 MED ORDER — NEPRO/CARBSTEADY PO LIQD: 237.0000 mL | ORAL | Status: DC | PRN

## 2011-07-24 MED ORDER — HYDROCODONE-ACETAMINOPHEN 5-325 MG PO TABS
1.0000 | ORAL_TABLET | Freq: Two times a day (BID) | ORAL | Status: DC
  Administered 2011-07-25 (×2): 2 via ORAL
  Administered 2011-07-26 – 2011-07-27 (×3): 1 via ORAL
  Administered 2011-07-28 – 2011-07-30 (×5): 2 via ORAL
  Administered 2011-07-30: 1 via ORAL
  Administered 2011-07-31: 2 via ORAL
  Administered 2011-07-31 – 2011-08-01 (×3): 1 via ORAL
  Administered 2011-08-02: 2 via ORAL
  Filled 2011-07-24: qty 1
  Filled 2011-07-24 (×2): qty 2
  Filled 2011-07-24 (×2): qty 1
  Filled 2011-07-24 (×5): qty 2
  Filled 2011-07-24: qty 1
  Filled 2011-07-24: qty 2
  Filled 2011-07-24: qty 1
  Filled 2011-07-24 (×2): qty 2
  Filled 2011-07-24: qty 1
  Filled 2011-07-24: qty 2

## 2011-07-24 MED ORDER — PENTAFLUOROPROP-TETRAFLUOROETH EX AERO: 1.0000 "application " | INHALATION_SPRAY | CUTANEOUS | Status: DC | PRN

## 2011-07-24 MED ORDER — ALUMINUM HYDROXIDE GEL 320 MG/5ML PO SUSP
15.0000 mL | Freq: Four times a day (QID) | ORAL | Status: DC | PRN
  Filled 2011-07-24: qty 30

## 2011-07-24 MED ORDER — HYDROCODONE-ACETAMINOPHEN 10-325 MG PO TABS
0.5000 | ORAL_TABLET | Freq: Four times a day (QID) | ORAL | Status: DC | PRN
  Administered 2011-07-24 – 2011-07-30 (×6): 1 via ORAL
  Filled 2011-07-24 (×7): qty 1

## 2011-07-24 MED ORDER — PANTOPRAZOLE SODIUM 40 MG PO TBEC
40.0000 mg | DELAYED_RELEASE_TABLET | Freq: Every day | ORAL | Status: DC
  Administered 2011-07-25 – 2011-08-09 (×15): 40 mg via ORAL
  Filled 2011-07-24 (×15): qty 1

## 2011-07-24 MED ORDER — HEPARIN SODIUM (PORCINE) 1000 UNIT/ML DIALYSIS
2000.0000 [IU] | INTRAMUSCULAR | Status: DC | PRN
  Filled 2011-07-24: qty 2

## 2011-07-24 MED ORDER — EZETIMIBE-SIMVASTATIN 10-40 MG PO TABS
1.0000 | ORAL_TABLET | Freq: Every day | ORAL | Status: DC
  Administered 2011-07-24 – 2011-08-08 (×16): 1 via ORAL
  Filled 2011-07-24 (×17): qty 1

## 2011-07-24 MED ORDER — WARFARIN SODIUM 5 MG PO TABS
5.0000 mg | ORAL_TABLET | Freq: Once | ORAL | Status: AC
  Administered 2011-07-24: 5 mg via ORAL
  Filled 2011-07-24: qty 1

## 2011-07-24 MED ORDER — TRAMADOL HCL 50 MG PO TABS
50.0000 mg | ORAL_TABLET | Freq: Two times a day (BID) | ORAL | Status: DC | PRN
  Administered 2011-07-27 – 2011-08-04 (×3): 50 mg via ORAL
  Filled 2011-07-24 (×4): qty 1

## 2011-07-24 MED ORDER — PHENOL 1.4 % MT LIQD
1.0000 | OROMUCOSAL | Status: DC | PRN
  Filled 2011-07-24: qty 177

## 2011-07-24 MED ORDER — ALLOPURINOL 150 MG HALF TABLET
150.0000 mg | ORAL_TABLET | Freq: Every day | ORAL | Status: DC
  Administered 2011-07-25 – 2011-08-09 (×15): 150 mg via ORAL
  Filled 2011-07-24 (×19): qty 1

## 2011-07-24 MED ORDER — NEPRO/CARBSTEADY PO LIQD: 237.0000 mL | Freq: Three times a day (TID) | ORAL | Status: DC | PRN

## 2011-07-24 MED ORDER — ACETAMINOPHEN 325 MG PO TABS: 325.0000 mg | ORAL_TABLET | ORAL | Status: DC | PRN

## 2011-07-24 MED ORDER — ONDANSETRON HCL 4 MG PO TABS: 4.0000 mg | ORAL_TABLET | Freq: Four times a day (QID) | ORAL | Status: DC | PRN

## 2011-07-24 MED ORDER — SODIUM CHLORIDE 0.9 % IV SOLN
62.5000 mg | INTRAVENOUS | Status: DC
  Administered 2011-07-26 – 2011-08-09 (×3): 62.5 mg via INTRAVENOUS
  Filled 2011-07-24 (×6): qty 5

## 2011-07-24 MED ORDER — CALCIUM CARBONATE ANTACID 500 MG PO CHEW
1.0000 | CHEWABLE_TABLET | Freq: Three times a day (TID) | ORAL | Status: DC
  Administered 2011-07-25 – 2011-08-09 (×43): 200 mg via ORAL
  Filled 2011-07-24 (×50): qty 1

## 2011-07-24 MED ORDER — GUAIFENESIN-DM 100-10 MG/5ML PO SYRP: 5.0000 mL | ORAL_SOLUTION | Freq: Four times a day (QID) | ORAL | Status: DC | PRN

## 2011-07-24 MED ORDER — ASPIRIN EC 81 MG PO TBEC
81.0000 mg | DELAYED_RELEASE_TABLET | Freq: Every day | ORAL | Status: DC
  Administered 2011-07-25 – 2011-08-09 (×16): 81 mg via ORAL
  Filled 2011-07-24 (×19): qty 1

## 2011-07-24 MED ORDER — METHOCARBAMOL 500 MG PO TABS: 500.0000 mg | ORAL_TABLET | Freq: Four times a day (QID) | ORAL | Status: DC | PRN

## 2011-07-24 MED ORDER — FLEET ENEMA 7-19 GM/118ML RE ENEM
1.0000 | ENEMA | Freq: Once | RECTAL | Status: AC | PRN
  Filled 2011-07-24: qty 1

## 2011-07-24 MED ORDER — HYDROCORTISONE ACETATE 25 MG RE SUPP
25.0000 mg | Freq: Two times a day (BID) | RECTAL | Status: DC
  Administered 2011-07-24: 25 mg via RECTAL
  Filled 2011-07-24 (×2): qty 1

## 2011-07-24 MED ORDER — ONDANSETRON HCL 4 MG/2ML IJ SOLN: 4.0000 mg | Freq: Four times a day (QID) | INTRAMUSCULAR | Status: DC | PRN

## 2011-07-24 MED ORDER — PARICALCITOL 5 MCG/ML IV SOLN
INTRAVENOUS | Status: AC
  Administered 2011-07-24: 4 ug via INTRAVENOUS
  Filled 2011-07-24: qty 1

## 2011-07-24 MED ORDER — DARBEPOETIN ALFA-POLYSORBATE 150 MCG/0.3ML IJ SOLN
150.0000 ug | INTRAMUSCULAR | Status: DC
  Administered 2011-07-29 – 2011-08-04 (×2): 150 ug via INTRAVENOUS
  Filled 2011-07-24 (×2): qty 0.3

## 2011-07-24 MED ORDER — WARFARIN - PHARMACIST DOSING INPATIENT: Freq: Every day | Status: DC

## 2011-07-24 MED ORDER — PARICALCITOL 5 MCG/ML IV SOLN
4.0000 ug | INTRAVENOUS | Status: DC
  Administered 2011-07-29: 06:00:00 via INTRAVENOUS
  Administered 2011-07-31 – 2011-08-09 (×5): 4 ug via INTRAVENOUS
  Filled 2011-07-24 (×7): qty 0.8

## 2011-07-24 MED ORDER — INSULIN ASPART 100 UNIT/ML ~~LOC~~ SOLN
0.0000 [IU] | Freq: Three times a day (TID) | SUBCUTANEOUS | Status: DC
  Administered 2011-07-24: 8 [IU] via SUBCUTANEOUS
  Administered 2011-07-25: 3 [IU] via SUBCUTANEOUS
  Administered 2011-07-26 – 2011-07-28 (×7): 2 [IU] via SUBCUTANEOUS
  Administered 2011-07-29: 5 [IU] via SUBCUTANEOUS
  Administered 2011-07-30: 3 [IU] via SUBCUTANEOUS
  Administered 2011-07-30 – 2011-07-31 (×3): 2 [IU] via SUBCUTANEOUS
  Administered 2011-07-31: 3 [IU] via SUBCUTANEOUS
  Administered 2011-08-01: 2 [IU] via SUBCUTANEOUS
  Administered 2011-08-01 – 2011-08-02 (×3): 3 [IU] via SUBCUTANEOUS
  Administered 2011-08-03: 5 [IU] via SUBCUTANEOUS
  Administered 2011-08-03 – 2011-08-05 (×3): 2 [IU] via SUBCUTANEOUS
  Administered 2011-08-05: 3 [IU] via SUBCUTANEOUS
  Administered 2011-08-05: 08:00:00 via SUBCUTANEOUS
  Administered 2011-08-06: 2 [IU] via SUBCUTANEOUS
  Administered 2011-08-06 – 2011-08-07 (×3): 3 [IU] via SUBCUTANEOUS
  Administered 2011-08-08: 2 [IU] via SUBCUTANEOUS

## 2011-07-24 MED ORDER — BACITRACIN ZINC 500 UNIT/GM EX OINT
TOPICAL_OINTMENT | Freq: Every day | CUTANEOUS | Status: DC
  Administered 2011-07-25 – 2011-08-09 (×12): via TOPICAL
  Filled 2011-07-24 (×2): qty 15

## 2011-07-24 MED ORDER — SODIUM CHLORIDE 0.9 % IV SOLN: 62.5000 mg | INTRAVENOUS | Status: DC

## 2011-07-24 MED ORDER — LIDOCAINE-PRILOCAINE 2.5-2.5 % EX CREA
1.0000 "application " | TOPICAL_CREAM | CUTANEOUS | Status: DC | PRN
  Filled 2011-07-24: qty 5

## 2011-07-24 MED ORDER — LIDOCAINE HCL (PF) 1 % IJ SOLN
5.0000 mL | INTRAMUSCULAR | Status: DC | PRN
  Filled 2011-07-24: qty 5

## 2011-07-24 NOTE — Discharge Summary (Signed)
[facility] Discharge Summary  Alan [name] 05-Apr-1931 78 y.o. male  [id number]  Admission Date: 07/20/2011  Discharge Date: 07/24/11  Physician: Alan Libman, MD  Admission Diagnosis: Peripheral Vascular Disease with ulcer   HPI:   This is a 78 y.o. male referred today for left leg ulcerations and gangrene as well as a right third toe ulcer. He also has a heel ulcer which she states has been present for greater than one month. He feels that the toes at change of the past several weeks. This does not appear to be causing him a significant amount of pain. He denies fevers or chills.  The patient does suffer from end-stage renal disease and is on dialysis. He has been seen in our office previously by Alan [name] in 2008. At that time ABIs were 0.54 on the right and 0.46 on the left.  He suffers from coronary artery disease and is status post coronary artery bypass grafting in 1996. He is on Coumadin. He is a former smoker and quit in 1970. He is medically managed for his diabetes.    Hospital Course:  The patient was admitted to the hospital and taken to the operating room on 07/20/2011 and underwent Left below knee amputation.  The pt tolerated the procedure well and was transported to the PACU in good condition.   By POD 1, the pt was doing well and was taking HD.  CIR consult was obtained, which he was a candidate for.  On POD 2, his dressing was removed and his wound was in tact with staples.  The stump is viable.  He did have acute surgical blood loss anemia, which he was tolerating.  He is discharged after HD to CIR.  The remainder of the hospital course consisted of increasing mobilization and increasing intake of solids without difficulty.  CBC    Component Value Date/Time   WBC 10.0 07/23/2011 1242   RBC 3.27* 07/23/2011 1242   HGB 9.4* 07/23/2011 1242   HCT 29.9* 07/23/2011 1242   PLT 251 07/23/2011 1242   MCV 91.4 07/23/2011 1242   MCH 28.7  07/23/2011 1242   MCHC 31.4 07/23/2011 1242   RDW 13.5 07/23/2011 1242   LYMPHSABS 0.8 05/18/2011 1547   MONOABS 0.2 05/18/2011 1547   EOSABS 0.0 05/18/2011 1547   BASOSABS 0.0 05/18/2011 1547    BMET    Component Value Date/Time   NA 133* 07/23/2011 1242   K 4.6 07/23/2011 1242   CL 93* 07/23/2011 1242   CO2 27 07/23/2011 1242   GLUCOSE 255* 07/23/2011 1242   BUN 44* 07/23/2011 1242   CREATININE 8.26* 07/23/2011 1242   CALCIUM 8.8 07/23/2011 1242   CALCIUM 8.8 10/27/2010 0734   GFRNONAA 5* 07/23/2011 1242   GFRAA 6* 07/23/2011 1242     Discharge Instructions:   The patient is discharged to home with extensive instructions on wound care and progressive ambulation.  They are instructed not to drive or perform any heavy lifting until returning to see the physician in his office.  Discharge Orders    Future Appointments: Provider: Department: Dept Phone: Center:   08/20/2011 11:15 AM Alan Libman, MD [facility] [phone] [facility]     Future Orders Please Complete By Expires   Resume previous diet      Lifting restrictions      Comments:   No lifting for 6 weeks   Call MD for:  temperature >100.5      Call MD for:  redness, tenderness, or signs of infection (pain, swelling, bleeding, redness, odor or green/yellow discharge around incision site)      Call MD for:  severe or increased pain, loss or decreased feeling  in affected limb(s)      may wash over wound with mild soap and water         Discharge Diagnosis:  Peripheral Vascular Disease with ulcer  Secondary Diagnosis: Patient Active Problem List  Diagnosis  . Campath-induced atrial fibrillation  . Encounter for long-term (current) use of anticoagulants  . Aortic stenosis  . CAD (coronary artery disease)  . End stage renal disease on dialysis  . Sepsis  . Healthcare-associated pneumonia  . Toxic metabolic encephalopathy  . Bacteremia due to Gram-positive bacteria  . Dehydration  . Diabetes mellitus  . Chronic  anticoagulation  . Hypotension  . Atherosclerosis of native arteries of the extremities with ulceration   Past Medical History  Diagnosis Date  . Diabetes mellitus   . Gout   . Hypertension   . Hyperlipidemia   . Chronic kidney disease     Started dialysis October 2012  . Arthritis   . Prostate cancer     s/p seed implant  . Cataract     BILATERAL-BEEN REMOVED  . Neuromuscular disorder     DABETIC NEUROPATHY-LOWER EXTREMITY  . Atrial fibrillation     on coumadin  . Aortic stenosis     moderate to severe, not felt to be a surgical candidate  . Anemia     secondary to end stage renal disease  . Coronary artery disease     remote CABG in 1996; last stress test in 2005; last cath 2005  . Diabetic neuropathy   . Edema   . PAD (peripheral artery disease)   . Diabetic retinopathy   . Chronic anticoagulation     on coumadin  . Nephrolithiasis     prior history of percutaneous nephrostomy  . Shortness of breath       Alan [name], Alan [name]  Home Medication Instructions [id number]   Printed on:07/24/11 [phone]  Medication Information                    glimepiride (AMARYL) 4 MG tablet Take 2 mg by mouth daily before breakfast.             traMADol (ULTRAM) 50 MG tablet Take 50 mg by mouth 2 (two) times daily as needed. pain           aspirin EC 81 MG tablet Take 81 mg by mouth daily.           estazolam (PROSOM) 2 MG tablet Take 1 mg by mouth at bedtime as needed. For sleep           allopurinol (ZYLOPRIM) 300 MG tablet Take 150 mg by mouth daily.           calcium carbonate (TUMS - DOSED IN MG ELEMENTAL CALCIUM) 500 MG chewable tablet Chew 1 tablet by mouth 3 (three) times daily before meals. Phosphorous binder           ezetimibe-simvastatin (VYTORIN) 10-40 MG per tablet Take 1 tablet by mouth at bedtime.           Nutritional Supplements (FEEDING SUPPLEMENT, NEPRO CARB STEADY,) LIQD Take 237 mLs by mouth 3 (three) times daily as needed.           metoprolol  tartrate (LOPRESSOR) 25 MG tablet Take 37.5 mg by mouth 2 (two)  times daily. Hold for systolic blood pressure(top number) less than 110 ,and heart rate less than 60, take 1/2 tablet by mouth bid           HYDROcodone-acetaminophen (VICODIN) 5-500 MG per tablet Take 1 tablet by mouth every 6 (six) hours as needed. For pain           warfarin (COUMADIN) 5 MG tablet Take 2.5-5 mg by mouth daily. Pt takes 2.5mg  Mon, Wed, Fri, Sat and 5mg  Tues, Thurs, Sun           warfarin (COUMADIN) 2.5 MG tablet Take 1 tablet (2.5 mg total) by mouth every Monday, Wednesday, Friday, and Saturday at 6 PM.           Nutritional Supplements (FEEDING SUPPLEMENT, NEPRO CARB STEADY,) LIQD Take 237 mLs by mouth as needed (missed meal during dialysis.).             Disposition: CIR  Patient's condition: is Good  Follow up: 1. [name] in 4 weeks   Doreatha Massed, PA-C [facility] [phone] 07/24/2011  8:53 AM

## 2011-07-24 NOTE — Progress Notes (Signed)
07/24/2011 8:15 AM POD 4  Subjective:  Patient is doing well no new complaints.  No active drainage from incision.    Afebrile VSS   97%RA Filed Vitals:   07/24/11 0800  BP: 99/53  Pulse: 82  Temp:   Resp: 16    Physical Exam: Incisions:  Clean dry and healing.  Skin is warm to touch. Extremities:  Ace wrap reapplied for compression.  CBC    Component Value Date/Time   WBC 10.0 07/23/2011 1242   RBC 3.27* 07/23/2011 1242   HGB 9.4* 07/23/2011 1242   HCT 29.9* 07/23/2011 1242   PLT 251 07/23/2011 1242   MCV 91.4 07/23/2011 1242   MCH 28.7 07/23/2011 1242   MCHC 31.4 07/23/2011 1242   RDW 13.5 07/23/2011 1242   LYMPHSABS 0.8 05/18/2011 1547   MONOABS 0.2 05/18/2011 1547   EOSABS 0.0 05/18/2011 1547   BASOSABS 0.0 05/18/2011 1547    BMET    Component Value Date/Time   NA 133* 07/23/2011 1242   K 4.6 07/23/2011 1242   CL 93* 07/23/2011 1242   CO2 27 07/23/2011 1242   GLUCOSE 255* 07/23/2011 1242   BUN 44* 07/23/2011 1242   CREATININE 8.26* 07/23/2011 1242   CALCIUM 8.8 07/23/2011 1242   CALCIUM 8.8 10/27/2010 0734   GFRNONAA 5* 07/23/2011 1242   GFRAA 6* 07/23/2011 1242    INR    Component Value Date/Time   INR 1.58* 07/24/2011 0530   INR 3.18 02/06/2011    No intake or output data in the 24 hours ending 07/24/11 0815   Assessment/Plan:  78 y.o. male is s/p left below knee amputation  POD 4  -pt in HD, will check back later. -approved for CIR   Doreatha Massed, PA-C [facility] [phone] 07/24/2011 8:15 AM

## 2011-07-24 NOTE — H&P (Signed)
Physical Medicine and Rehabilitation Admission H&P  CC: PVD/ L-BKA  HPI: Alan [name] is a 78 y.o. male with ESRD, DM, BLE ulcers with PVD and admitted on 07/20/11 for L-BKA by [name]. Post op hemodialysis ongoing. Acute on chronic anemia treated with ESA. constipation resolving. Pain control remains an issue. Seen by [employer] today for preprosthetic education.  Review of Systems  HENT: Positive for ear pain.  Respiratory: Negative for cough and shortness of breath.  Cardiovascular: Negative for chest pain and palpitations.  Gastrointestinal: Positive for diarrhea (due to laxatives.).  Musculoskeletal: Positive for myalgias and back pain.  Neurological: Negative for headaches.   Past Medical History   Diagnosis  Date   .  Diabetes mellitus    .  Gout    .  Hypertension    .  Hyperlipidemia    .  Chronic kidney disease      Started dialysis October 2012   .  Arthritis    .  Prostate cancer      s/p seed implant   .  Cataract      BILATERAL-BEEN REMOVED   .  Neuromuscular disorder      DABETIC NEUROPATHY-LOWER EXTREMITY   .  Atrial fibrillation      on coumadin   .  Aortic stenosis      moderate to severe, not felt to be a surgical candidate   .  Anemia      secondary to end stage renal disease   .  Coronary artery disease      remote CABG in 1996; last stress test in 2005; last cath 2005   .  Diabetic neuropathy    .  Edema    .  PAD (peripheral artery disease)    .  Diabetic retinopathy    .  Chronic anticoagulation      on coumadin   .  Nephrolithiasis      prior history of percutaneous nephrostomy   .  Shortness of breath     Past Surgical History   Procedure  Date   .  Ventral hernia repair    .  Inguinal hernia repair      right side   .  Colonoscopy    .  Kidney stone surgery    .  Polypectomy    .  Tonsillectomy    .  Toe surgery      removal little toe right foot   .  Cardiac catheterization  04/19/2003     SEVERE 2 VESSEL OBSTRUCTIVE  ATHERSCLEROTIC CAD   .  Coronary artery bypass graft  1996     LIMA GRAFT TO THE LAD, SEQUENTIAL SAPHENOUS VEIN GRAFT TO THE FIRST AND SECOND DIAGONIAL BRANCHES, SEQUENTIAL SAPHENOUS VEIN GRAFT TO THE ACUTE MARGINAL, POSTERIOR DESCENDING, AND POSTERIOR LATERAL BRANCHES OF THE RIGHT CORONARY ARTERY   .  Radioactive seed implant    .  US echocardiography  09/06/2009     EF 55-60%   .  Cardiovascular stress test  04/13/2003     EF 54%. EVIDENCE OF ANTERO-APICAL ISCHEMIA. NORMAL LV SIZE AND FUNCTION   .  Tee without cardioversion  05/09/2011     Procedure: TRANSESOPHAGEAL ECHOCARDIOGRAM (TEE); Surgeon: Laurey Morale, MD; Location: [facility] ENDOSCOPY; Service: Cardiovascular; Laterality: N/A;   .  Hernia repair    .  Amputation  07/20/2011     Procedure: AMPUTATION BELOW KNEE; Surgeon: Nada Libman, MD; Location: [facility] OR; Service: Vascular; Laterality: Left;  Family History   Problem  Relation  Age of Onset   .  Heart disease  Father    .  Hypertension  Brother    .  Stroke  Brother     Social History: Married. Used to work for the [employer].-disabled due to back problems Sedentary due to walking problems. Use [employer]. Needed assist with ADLs. He reports that he quit smoking about 43 years ago. His smoking use included Cigarettes. He quit after 2 years of use. He has quit using smokeless tobacco. His smokeless tobacco use included Chew. He reports that he does not drink alcohol or use illicit drugs.  Allergies   Allergen  Reactions   .  Ancef (Cefazolin)  Rash    Scheduled Meds:  .  allopurinol  150 mg  Oral  Daily   .  aspirin EC  81 mg  Oral  Daily   .  calcium carbonate  1 tablet  Oral  TID AC   .  darbepoetin (ARANESP) injection - DIALYSIS  150 mcg  Intravenous  Q Sat-HD   .  docusate sodium  100 mg  Oral  Daily   .  enoxaparin (LOVENOX) injection  30 mg  Subcutaneous  Q24H   .  ezetimibe-simvastatin  1 tablet  Oral  QHS   .  glimepiride  2 mg  Oral  QAC breakfast   .  insulin aspart  0-15  Units  Subcutaneous  TID WC   .  metoprolol tartrate  37.5 mg  Oral  BID   .  pantoprazole  40 mg  Oral  Q1200   .  paricalcitol  4 mcg  Intravenous  Q T,Th,Sa-HD   .  silver sulfADIAZINE  1 application  Topical  Daily   .  warfarin  2.5 mg  Oral  Custom   .  warfarin  5 mg  Oral  Custom   .  Warfarin - Physician Dosing Inpatient   Does not apply  q1800    Medications Prior to Admission   Medication  Sig  Dispense  Refill   .  allopurinol (ZYLOPRIM) 300 MG tablet  Take 150 mg by mouth daily.     [name]  aspirin EC 81 MG tablet  Take 81 mg by mouth daily.     .  calcium carbonate (TUMS - DOSED IN MG ELEMENTAL CALCIUM) 500 MG chewable tablet  Chew 1 tablet by mouth 3 (three) times daily before meals. Phosphorous binder     .  estazolam (PROSOM) 2 MG tablet  Take 1 mg by mouth at bedtime as needed. For sleep     .  ezetimibe-simvastatin (VYTORIN) 10-40 MG per tablet  Take 1 tablet by mouth at bedtime.  30 tablet  0   .  glimepiride (AMARYL) 4 MG tablet  Take 2 mg by mouth daily before breakfast.     .  metoprolol tartrate (LOPRESSOR) 25 MG tablet  Take 37.5 mg by mouth 2 (two) times daily. Hold for systolic blood pressure(top number) less than 110 ,and heart rate less than 60, take 1/2 tablet by mouth bid     .  traMADol (ULTRAM) 50 MG tablet  Take 50 mg by mouth 2 (two) times daily as needed. pain     .  warfarin (COUMADIN) 5 MG tablet  Take 2.5-5 mg by mouth daily. Pt takes 2.5mg  Mon, Wed, Fri, Sat and 5mg  Tues, Thurs, Sun     .  DISCONTD: silver sulfADIAZINE (SILVADENE) 1 %  cream  Apply 1 application topically daily.     [name]  HYDROcodone-acetaminophen (VICODIN) 5-500 MG per tablet  Take 1 tablet by mouth every 6 (six) hours as needed. For pain     .  Nutritional Supplements (FEEDING SUPPLEMENT, NEPRO CARB STEADY,) LIQD  Take 237 mLs by mouth 3 (three) times daily as needed.      Home:  Home Living  Lives With: Spouse  Available Help at Discharge: Family;Available 24 hours/day (Wife unable to  physically assist patient with mobility.)  Type of Home: House  Home Access: Stairs to enter  [employer] of Steps: 2  Entrance Stairs-Rails: Right;Left  Home Layout: One level  Bathroom Shower/Tub: Pension scheme manager: Standard  Bathroom Accessibility: Yes  How Accessible: Accessible via walker  Home Adaptive Equipment: Bedside commode/3-in-1;Straight cane;Walker - rolling  Additional Comments: son can build a ramp  Functional History:  Prior Function  Bath: Minimal  Meal Prep: Total  Light Housekeeping: Total  Able to Take Stairs?: Yes  Driving: No  Vocation: Retired  Functional Status:  Mobility:  Bed Mobility  Bed Mobility: Not assessed  Rolling Left: 4: Min assist;With rail  Left Sidelying to Sit: With rails;HOB flat;2: Max assist  Sitting - Scoot to [place] of Bed: 3: Mod assist  Sit to Supine: 1: +2 Total assist;HOB elevated  Sit to Supine: Patient Percentage: 20%  Scooting to HOB: 1: +2 Total assist  Scooting to [facility]: Patient Percentage: 20%  Transfers  Transfers: Sit to Stand;Stand to [employer] Transfers  Sit to Stand: 1: +2 Total assist;From chair/3-in-1  Sit to Stand: Patient Percentage: 30%  Stand to Sit: 1: +2 Total assist;With upper extremity assist;To bed  Stand to Sit: Patient Percentage: 30%  Stand Pivot Transfers: 1: +2 Total assist  Stand Pivot Transfers: Patient Percentage: 20%  Ambulation/Gait  Ambulation/Gait Assistance: Not tested (comment)   ADL:  ADL  Eating/Feeding: Simulated;Set up  Where Assessed - Eating/Feeding: Chair  Grooming: Performed;Wash/dry face;Wash/dry hands;Set up  Where Assessed - Grooming: Supported sitting  Upper Body Bathing: Simulated;Moderate assistance  Where Assessed - Upper Body Bathing: Supported sitting  Lower Body Bathing: Simulated;+1 Total assistance  Where Assessed - Lower Body Bathing: Supine, head of bed up;Rolling right and/or left  Upper Body Dressing: Simulated;Maximal assistance    Where Assessed - Upper Body Dressing: Unsupported sitting  Lower Body Dressing: Simulated;+1 Total assistance  Where Assessed - Lower Body Dressing: Supine, head of bed up;Rolling right and/or left  Toilet Transfer: Simulated;+2 Total assistance  Toilet Transfer Method: Stand pivot  Toilet Transfer Equipment: Other (comment) (bed - chair)  Equipment Used: Gait belt  Transfers/Ambulation Related to ADLs: +2 total A. non ambulatory  ADL Comments: Pt with slow processing speed with grooming and UB exercise tasks.  Cognition:  Cognition  Arousal/Alertness: Awake/alert  Orientation Level: Oriented X4  Cognition  Overall Cognitive Status: Impaired  Area of Impairment: Memory;Following commands;Safety/judgement  Arousal/Alertness: Awake/alert  Orientation Level: Appears intact for tasks assessed  Behavior During Session: [facility] for tasks performed  Current Attention Level: Sustained  Memory: Decreased recall of precautions  Memory Deficits: Needed repeated cues for safety  Following Commands: Other (comment) (needs visual and verbal cues)  Safety/Judgement: Decreased safety judgement for tasks assessed  Problem Solving: delayed - basic functional  Executive Functioning: poor self regulation  Blood pressure 110/61, pulse 82, temperature 97.4 F (36.3 C), temperature source Oral, resp. rate 18, height 6' (1.829 m), weight 105.5 kg (232 lb 9.4 oz), SpO2 97.00%.  Physical Exam  Nursing note and vitals reviewed.  Constitutional: He is oriented to person, place, and time. He appears well-developed and well-nourished.  HENT:  Head: Normocephalic and atraumatic.  Eyes: Pupils are equal, round, and reactive to light.  Neck: Normal range of motion.  Cardiovascular: An irregularly irregular rhythm present.  Murmur (holosystolic) heard.  Pulmonary/Chest: Effort normal and breath sounds normal.  Abdominal: Soft. He exhibits no distension.  Musculoskeletal:  Right third toe with ruptured blister. L  BKA with staples in place. Moderate edema with bruising on skin flap.  Neurological: He is alert and oriented to person, place, and time.  Mild decrease in hearing. Follows basic commands without difficulty. Moves all four.  Psychiatric: His speech is normal. He is slowed.   CBC Status: Abnormal    Collection Time    07/23/11 12:42 PM   Component  Value  Range  Comment    WBC  10.0  4.0 - 10.5 K/uL     RBC  3.27 (*)  4.22 - 5.81 MIL/uL     Hemoglobin  9.4 (*)  13.0 - 17.0 g/dL     HCT  02.7 (*)  25.3 - 52.0 %     MCV  91.4  78.0 - 100.0 fL     MCH  28.7  26.0 - 34.0 pg     MCHC  31.4  30.0 - 36.0 g/dL     RDW  66.4  40.3 - 47.4 %     Platelets  251  150 - 400 K/uL    RENAL FUNCTION PANEL Status: Abnormal    Collection Time    07/23/11 12:42 PM   Component  Value  Range  Comment    Sodium  133 (*)  135 - 145 mEq/L     Potassium  4.6  3.5 - 5.1 mEq/L     Chloride  93 (*)  96 - 112 mEq/L     CO2  27  19 - 32 mEq/L     Glucose, Bld  255 (*)  70 - 99 mg/dL     BUN  44 (*)  6 - 23 mg/dL     Creatinine, Ser  2.59 (*)  0.50 - 1.35 mg/dL     Calcium  8.8  8.4 - 10.5 mg/dL     Phosphorus  4.2  2.3 - 4.6 mg/dL     Albumin  2.7 (*)  3.5 - 5.2 g/dL     GFR calc non Af Amer  5 (*)  >90 mL/min     GFR calc Af Amer  6 (*)  >90 mL/min    PROTIME-INR Status: Abnormal    Collection Time    07/24/11 5:30 AM   Component  Value  Range  Comment    Prothrombin Time  19.2 (*)  11.6 - 15.2 seconds     INR  1.58 (*)  0.00 - 1.49    GLUCOSE, CAPILLARY Status: Abnormal    Collection Time    07/24/11 6:04 AM   Component  Value  Range  Comment    Glucose-Capillary  127 (*)  70 - 99 mg/dL    No results found.  Post Admission Physician Evaluation:  1. Functional deficits secondary to left BKA. 2. Patient is admitted to receive collaborative, interdisciplinary care between the physiatrist, rehab nursing staff, and therapy team. 3. Patient's level of medical complexity and substantial therapy needs in  context of that medical necessity cannot be provided at a lesser intensity of  care such as a SNF. 4. Patient has experienced substantial functional loss from his/her baseline which was documented above under the "Functional History" and "Functional Status" headings. Judging by the patient's diagnosis, physical exam, and functional history, the patient has potential for functional progress which will result in measurable gains while on inpatient rehab. These gains will be of substantial and practical use upon discharge in facilitating mobility and self-care at the household level. 5. Physiatrist will provide 24 hour management of medical needs as well as oversight of the therapy plan/treatment and provide guidance as appropriate regarding the interaction of the two. 6. 24 hour rehab nursing will assist with bladder management, bowel management, safety, skin/wound care, disease management, medication administration, pain management and patient education and help integrate therapy concepts, techniques,education, etc. 7. PT will assess and treat for: pre-prosthetic ed, fxnl mobility, pain mgt, adaptive equipment training, ROM. Goals are: minimal assistance. 8. OT will assess and treat for: UES, ROM, ADl's, fxnl mobility. Goals are: minimal assist. 9. SLP will assess and treat for: not app. 10. Case Management and Social Worker will assess and treat for psychological issues and discharge planning. 11. Team conference will be held weekly to assess progress toward goals and to determine barriers to discharge. 12. Patient will receive at least 3 hours of therapy per day at least 5 days per week. 13. ELOS: 2 weeks Prognosis: excellent Medical Problem List and Plan:  1. DVT Prophylaxis/Anticoagulation: Pharmaceutical: Coumadin  2. Pain Management: Will schedule pain medication prior to therapy schedule.  3. Mood: A little irritated about food choices, pain management, etc. Ego support provided. Continue to  monitor.  4. Neuropsych: This patient is capable of making decisions on his/her own behalf.  5. ESRD: Nephrology to assist with HD needs. Continue 3 X week.  7. Acute on chronic anemia: Routine labs with HD. Continue aranesp weekly.  8. Right toe ulcer: continue silvadene cream daily. PRAFO for protection when in bed.  9. HTN: monitor with BID checks. Continue lopressor.  10. Dyslipidemia: continue vytorin.  11. DM type 2: Monitor BS with bid checks. Continue Amaryl daily. Use SSI for tighter BS control.  12. Constipation: Reports diarrhea with colace. Will discontinue. Would prefer suppository prn.  13. A Fib: Continue to check HR on bid basis. Continue lopressor. Continue chronic coumadin.  14. CAD with moderate to severe aortic stenosis: monitor for symptoms with increase in activity levels. Continue lopressor, vytorin and coumadin  Ian Malkin Keayra Graham,MD

## 2011-07-24 NOTE — PMR Pre-admission (Signed)
PMR Admission Coordinator Pre-Admission Assessment  Patient: Alan [name] is an 78 y.o., male MRN: [id number] DOB: [birth year] Height: 6' (182.9 cm) Weight: 105.5 kg (232 lb 9.4 oz)  Insurance Information HMO:      PPO:       PCP:       IPA:       80/20:       OTHER:   PRIMARY: Medicare A/B      Policy#: [id number]      Subscriber: Marlene Lard CM Name:        Phone#:       Fax#:   Pre-Cert#:        Employer: Retired Benefits:  Phone #:       Name: Armed forces training and education officer. Date: A 08/09/95 B 04/08/97     Deduct: $1184      Out of Pocket Max: none      Life Max: unlimited CIR: 100%      SNF: 100 days   LBD = 05/12/11 Outpatient: 80%     Co-Pay: 20% Home Health: 100%      Co-Pay: none DME: 80%     Co-Pay: 20% Providers: patient's choice  SECONDARY: A[employer]      Policy#: [id number]      Subscriber: Marlene Lard CM Name:        Phone#:       Fax#:   Pre-Cert#:        Employer: Retired Benefits:  Phone #: [phone]     Name:   Eff. Date:       Deduct:        Out of Pocket Max:        Life Max:   CIR:        SNF:   Outpatient:       Co-Pay:   Home Health:        Co-Pay:   DME:       Co-Pay:    Emergency Contact Information Contact Information    Name Relation Home Work Mobile   Winfield,[name] L Spouse [phone]       Current Medical History  Patient Admitting Diagnosis:  L BKA  History of Present Illness: A 78 y.o. male with ESRD, DM, BLE ulcers with PVD and admitted on 07/20/11 for L-BKA by [name]. Post op hemodialysis ongoing. Pain control remains an issue.   Past Medical History  Past Medical History  Diagnosis Date  . Diabetes mellitus   . Gout   . Hypertension   . Hyperlipidemia   . Chronic kidney disease     Started dialysis October 2012  . Arthritis   . Prostate cancer     s/p seed implant  . Cataract     BILATERAL-BEEN REMOVED  . Neuromuscular disorder     DABETIC NEUROPATHY-LOWER EXTREMITY  . Atrial fibrillation     on coumadin  . Aortic  stenosis     moderate to severe, not felt to be a surgical candidate  . Anemia     secondary to end stage renal disease  . Coronary artery disease     remote CABG in 1996; last stress test in 2005; last cath 2005  . Diabetic neuropathy   . Edema   . PAD (peripheral artery disease)   . Diabetic retinopathy   . Chronic anticoagulation     on coumadin  . Nephrolithiasis     prior history of percutaneous nephrostomy  . Shortness of breath     Family History  family history includes Heart disease in his father; Hypertension in his brother; and Stroke in his brother.  Prior Rehab/Hospitalizations:  No previous rehab admissions.  Current Medications  Current facility-administered medications:0.9 %  sodium chloride infusion, 100 mL, Intravenous, PRN, Vonna Kotyk K. Allena Katz, MD;  0.9 %  sodium chloride infusion, 100 mL, Intravenous, PRN, Vonna Kotyk K. Allena Katz, MD;  acetaminophen (TYLENOL) suppository 325-650 mg, 325-650 mg, Rectal, Q4H PRN, Ames Coupe Rhyne, PA;  acetaminophen (TYLENOL) tablet 325-650 mg, 325-650 mg, Oral, Q4H PRN, Ames Coupe Rhyne, PA allopurinol (ZYLOPRIM) tablet 150 mg, 150 mg, Oral, Daily, Ames Coupe Rhyne, PA, 150 mg at 07/23/11 1111;  alum & mag hydroxide-simeth (MAALOX/MYLANTA) 200-200-20 MG/5ML suspension 15-30 mL, 15-30 mL, Oral, Q2H PRN, Ames Coupe Rhyne, PA;  aspirin EC tablet 81 mg, 81 mg, Oral, Daily, Ames Coupe Rhyne, PA, 81 mg at 07/23/11 1112 calcium carbonate (TUMS - dosed in mg elemental calcium) chewable tablet 200 mg of elemental calcium, 1 tablet, Oral, TID AC, Samantha J Rhyne, PA, 200 mg of elemental calcium at 07/23/11 1649;  darbepoetin (ARANESP) injection 150 mcg, 150 mcg, Intravenous, Q Sat-HD, Jay K. Allena Katz, MD, 150 mcg at 07/21/11 0857;  docusate sodium (COLACE) capsule 100 mg, 100 mg, Oral, Daily, Ames Coupe Rhyne, PA, 100 mg at 07/23/11 1112 enoxaparin (LOVENOX) injection 30 mg, 30 mg, Subcutaneous, Q24H, Samantha J Rhyne, PA, 30 mg at 07/23/11 1113;   ezetimibe-simvastatin (VYTORIN) 10-40 MG per tablet 1 tablet, 1 tablet, Oral, QHS, Ames Coupe Rhyne, PA, 1 tablet at 07/23/11 2222;  feeding supplement (NEPRO CARB STEADY) liquid 237 mL, 237 mL, Oral, TID PRN, Ames Coupe Rhyne, PA;  feeding supplement (NEPRO CARB STEADY) liquid 237 mL, 237 mL, Oral, PRN, Vonna Kotyk K. Allena Katz, MD ferric gluconate (NULECIT) 62.5 mg in sodium chloride 0.9 % 100 mL IVPB, 62.5 mg, Intravenous, Q Thu-HD, Lizbeth Bark, FNP;  glimepiride (AMARYL) tablet 2 mg, 2 mg, Oral, QAC breakfast, Ames Coupe Rhyne, PA, 2 mg at 07/22/11 0607;  guaiFENesin-dextromethorphan (ROBITUSSIN DM) 100-10 MG/5ML syrup 15 mL, 15 mL, Oral, Q4H PRN, Ames Coupe Rhyne, PA;  heparin injection 1,000 Units, 1,000 Units, Dialysis, PRN, Vonna Kotyk K. Allena Katz, MD heparin injection 2,000 Units, 2,000 Units, Dialysis, PRN, Maree Krabbe, MD;  hydrALAZINE (APRESOLINE) injection 10 mg, 10 mg, Intravenous, Q2H PRN, Ames Coupe Rhyne, PA;  HYDROcodone-acetaminophen (NORCO) 5-325 MG per tablet 1 tablet, 1 tablet, Oral, Q4H PRN, Dara Lords, PA, 1 tablet at 07/23/11 0650;  insulin aspart (novoLOG) injection 0-15 Units, 0-15 Units, Subcutaneous, TID WC, Dara Lords, PA, 2 Units at 07/23/11 1649 labetalol (NORMODYNE,TRANDATE) injection 10 mg, 10 mg, Intravenous, Q2H PRN, Samantha J Rhyne, PA;  lidocaine (XYLOCAINE) 1 % injection 5 mL, 5 mL, Intradermal, PRN, Vonna Kotyk K. Allena Katz, MD;  lidocaine-prilocaine (EMLA) cream 1 application, 1 application, Topical, PRN, Hartley Barefoot. Allena Katz, MD;  metoprolol (LOPRESSOR) injection 2-5 mg, 2-5 mg, Intravenous, Q2H PRN, Ames Coupe Rhyne, PA metoprolol tartrate (LOPRESSOR) tablet 37.5 mg, 37.5 mg, Oral, BID, Ames Coupe Rhyne, PA, 37.5 mg at 07/23/11 2222;  morphine 2 MG/ML injection 2-5 mg, 2-5 mg, Intravenous, Q1H PRN, Samantha J Rhyne, PA;  ondansetron (ZOFRAN) injection 4 mg, 4 mg, Intravenous, Q6H PRN, Samantha J Rhyne, PA;  pantoprazole (PROTONIX) EC tablet 40 mg, 40 mg, Oral, Q1200, Ames Coupe Rhyne, PA, 40 mg  at 07/23/11 1202 paricalcitol (ZEMPLAR) injection 4 mcg, 4 mcg, Intravenous, Q T,Th,Sa-HD, Jay K. Allena Katz, MD, 4 mcg at 07/24/11 0823;  pentafluoroprop-tetrafluoroeth (GEBAUERS) aerosol 1 application, 1 application, Topical, PRN, Vonna Kotyk  Concha Se, MD;  phenol (CHLORASEPTIC) mouth spray 1 spray, 1 spray, Mouth/Throat, PRN, Ames Coupe Rhyne, PA silver sulfADIAZINE (SILVADENE) 1 % cream 1 application, 1 application, Topical, Daily, Ames Coupe Rhyne, PA, 1 application at 07/23/11 1116;  temazepam (RESTORIL) capsule 15 mg, 15 mg, Oral, QHS PRN, Ames Coupe Rhyne, PA;  traMADol (ULTRAM) tablet 50 mg, 50 mg, Oral, BID PRN, Ames Coupe Rhyne, PA;  warfarin (COUMADIN) tablet 2.5 mg, 2.5 mg, Oral, Custom, Ames Coupe Rhyne, PA, 2.5 mg at 07/23/11 1735 warfarin (COUMADIN) tablet 5 mg, 5 mg, Oral, Custom, Samantha J Rhyne, PA, 5 mg at 07/22/11 1800;  Warfarin - Physician Dosing Inpatient, , Does not apply, q1800, Dara Lords, PA  Patients Current Diet: Renal  Precautions / Restrictions Precautions Precautions: Fall Precaution Comments: Patient fearful of falling Restrictions Weight Bearing Restrictions: Yes LLE Weight Bearing: Non weight bearing   Prior Activity Level Limited Community (1-2x/wk): Went out 3 X a week to HD T-TH-Sat  Home Assistive Devices / Equipment Home Assistive Devices/Equipment: Dan Humphreys (specify type) Home Adaptive Equipment: Bedside commode/3-in-1;Straight cane;Walker - rolling  Prior Functional Level Prior Function Level of Independence: Needs assistance Needs Assistance: Bathing;Meal Prep;Light Housekeeping Bath: Minimal Meal Prep: Total Light Housekeeping: Total Able to Take Stairs?: Yes Driving: No Vocation: Retired  Current Functional Level Cognition  Arousal/Alertness: Awake/alert Overall Cognitive Status: Impaired Current Attention Level: Sustained Memory: Decreased recall of precautions Memory Deficits: Needed repeated cues for safety Orientation Level: Oriented  X4 Following Commands: Other (comment) (needs visual and verbal cues) Safety/Judgement: Decreased safety judgement for tasks assessed Executive Functioning: poor self regulation    Extremity Assessment (includes Sensation/Coordination)  RUE ROM/Strength/Tone: Deficits RUE ROM/Strength/Tone Deficits: @ 3+/5 throughout RUE Sensation: WFL - Light Touch;WFL - Proprioception  RLE ROM/Strength/Tone: Deficits RLE ROM/Strength/Tone Deficits: General weakness 4-/5.  Wounds on foot    ADLs  Eating/Feeding: Simulated;Set up Where Assessed - Eating/Feeding: Chair Grooming: Performed;Wash/dry face;Wash/dry hands;Set up Where Assessed - Grooming: Supported sitting Upper Body Bathing: Simulated;Moderate assistance Where Assessed - Upper Body Bathing: Supported sitting Lower Body Bathing: Simulated;+1 Total assistance Where Assessed - Lower Body Bathing: Supine, head of bed up;Rolling right and/or left Upper Body Dressing: Simulated;Maximal assistance Where Assessed - Upper Body Dressing: Unsupported sitting Lower Body Dressing: Simulated;+1 Total assistance Where Assessed - Lower Body Dressing: Supine, head of bed up;Rolling right and/or left Toilet Transfer: Simulated;+2 Total assistance Toilet Transfer: Patient Percentage: 20% Toilet Transfer Method: Stand pivot Acupuncturist: Other (comment) (bed - chair) Toileting - Clothing Manipulation and Hygiene: +2 Total assistance;Performed Toileting - Clothing Manipulation and Hygiene: Patient Percentage: 20% Where Assessed - Toileting Clothing Manipulation and Hygiene: Rolling right and/or left Equipment Used: Gait belt Transfers/Ambulation Related to ADLs: +2 total A. non ambulatory ADL Comments: Pt with slow processing speed with grooming and UB exercise tasks.    Mobility  Bed Mobility: Not assessed Rolling Left: 4: Min assist;With rail Left Sidelying to Sit: With rails;HOB flat;2: Max assist Sitting - Scoot to Edge of Bed: 3: Mod  assist Sit to Supine: 1: +2 Total assist;HOB elevated Sit to Supine: Patient Percentage: 20% Scooting to HOB: 1: +2 Total assist Scooting to [facility]: Patient Percentage: 20%    Transfers  Transfers: Sit to Stand;Stand to [employer] Transfers Sit to Stand: 1: +2 Total assist;From chair/3-in-1 Sit to Stand: Patient Percentage: 30% Stand to Sit: 1: +2 Total assist;With upper extremity assist;To bed Stand to Sit: Patient Percentage: 30% Stand Pivot Transfers: 1: +2 Total assist Stand Pivot Transfers: Patient Percentage:  20%    Ambulation / Gait / Stairs / Psychologist, prison and probation services  Ambulation/Gait Ambulation/Gait Assistance: Not tested (comment)    Posture / Balance Static Sitting Balance Static Sitting - Balance Support: Bilateral upper extremity supported Static Sitting - Level of Assistance: 5: Stand by assistance     Previous Home Environment Living Arrangements: Spouse/significant other Lives With: Spouse Available Help at Discharge: Family;Available 24 hours/day (Wife unable to physically assist patient with mobility.) Type of Home: House Home Layout: One level Home Access: Stairs to enter Entrance Stairs-Rails: Doctor, general practice of Steps: 2 Bathroom Shower/Tub: Health visitor: Pharmacist, community: Yes How Accessible: Accessible via walker Home Care Services: No Additional Comments: son can build a ramp  Discharge Living Setting Plans for Discharge Living Setting: Patient's home;House;Lives with (comment) (Lives with wife and 69 yr old son.) Type of Home at Discharge: House Discharge Home Layout: One level Discharge Home Access: Stairs to enter Entrance Stairs-Number of Steps: 1 step front and 4 steps back entry. Do you have any problems obtaining your medications?: No  Social/Family/Support Systems Patient Roles: Spouse;Parent Contact Information: [name] Scientist, product/process development - wife (h) [phone]; Marlene Lard, JR - son (c)  [phone] Anticipated Caregiver: wife and son, Iantha Fallen Ability/Limitations of Caregiver: Wife cannot help physically.  Son Iantha Fallen can assist. Caregiver Availability: 24/7 Discharge Plan Discussed with Primary Caregiver: Yes Is Caregiver In Agreement with Plan?: Yes Does Caregiver/Family have Issues with Lodging/Transportation while Pt is in Rehab?: No Has 2 sons and 1 daughter locally.  Goals/Additional Needs Patient/Family Goal for Rehab: PR/OT S/min A, no ST goals Expected length of stay: 2 weeks Cultural Considerations: [employer] Dietary Needs: Renal diet 80/90-2-02-09-1198, thin liquids Equipment Needs: TBD Pt/Family Agrees to Admission and willing to participate: Yes Program Orientation Provided & Reviewed with Pt/Caregiver Including Roles  & Responsibilities: Yes  Patient Condition: This patient's condition remains as documented in the Consult dated 07/23/11, in which the Rehabilitation Physician determined and documented that the patient's condition is appropriate for intensive rehabilitative care in an inpatient rehabilitation facility.  Preadmission Screen Completed By:  Trish Mage, 07/24/2011 12:09 PM ______________________________________________________________________   Discussed status with Dr. Riley Kill on 07/24/11 at 1215 and received telephone approval for admission today.  Admission Coordinator:  Trish Mage, time1215/Date07/16/13

## 2011-07-24 NOTE — Progress Notes (Signed)
Orthopedic Tech Progress Note Patient Details:  Alan [name] 1931/04/29 [id number]  Patient ID: [name], male   DOB: 06-10-1931, 78 y.o.   MRN: [id number] [facility] with order instructions.  Leo Grosser T 07/24/2011, 12:38 PM

## 2011-07-24 NOTE — Progress Notes (Signed)
ANTICOAGULATION CONSULT NOTE - Initial Consult  Pharmacy Consult for Coumadin Indication:  H/o  atrial fibrillation  Allergies  Allergen Reactions  . Ancef (Cefazolin) Rash    Patient Measurements: Height: 6' (182.9 cm) Weight: 227 lb 11.8 oz (103.3 kg) IBW/kg (Calculated) : 77.6    Vital Signs: Temp: 99.7 F (37.6 C) (07/16 1640) Temp src: Oral (07/16 1640) BP: 118/75 mmHg (07/16 1640) Pulse Rate: 84  (07/16 1640)  Labs:  Basename 07/24/11 0530 07/23/11 1242 07/23/11 0605  HGB -- 9.4* --  HCT -- 29.9* --  PLT -- 251 --  APTT -- -- --  LABPROT 19.2* -- 17.6*  INR 1.58* -- 1.42  HEPARINUNFRC -- -- --  CREATININE -- 8.26* --  CKTOTAL -- -- --  CKMB -- -- --  TROPONINI -- -- --    Estimated Creatinine Clearance: 9.3 ml/min (by C-G formula based on Cr of 8.26).   Medical History: Past Medical History  Diagnosis Date  . Diabetes mellitus   . Gout   . Hypertension   . Hyperlipidemia   . Chronic kidney disease     Started dialysis October 2012  . Arthritis   . Prostate cancer     s/p seed implant  . Cataract     BILATERAL-BEEN REMOVED  . Neuromuscular disorder     DABETIC NEUROPATHY-LOWER EXTREMITY  . Atrial fibrillation     on coumadin  . Aortic stenosis     moderate to severe, not felt to be a surgical candidate  . Anemia     secondary to end stage renal disease  . Coronary artery disease     remote CABG in 1996; last stress test in 2005; last cath 2005  . Diabetic neuropathy   . Edema   . PAD (peripheral artery disease)   . Diabetic retinopathy   . Chronic anticoagulation     on coumadin  . Nephrolithiasis     prior history of percutaneous nephrostomy  . Shortness of breath     Medications:  Prescriptions prior to admission  Medication Sig Dispense Refill  . allopurinol (ZYLOPRIM) 300 MG tablet Take 150 mg by mouth daily.      [name] aspirin EC 81 MG tablet Take 81 mg by mouth daily.      . calcium carbonate (TUMS - DOSED IN MG ELEMENTAL  CALCIUM) 500 MG chewable tablet Chew 1 tablet by mouth 3 (three) times daily before meals. Phosphorous binder      . estazolam (PROSOM) 2 MG tablet Take 1 mg by mouth at bedtime as needed. For sleep      . ezetimibe-simvastatin (VYTORIN) 10-40 MG per tablet Take 1 tablet by mouth at bedtime.  30 tablet  0  . glimepiride (AMARYL) 4 MG tablet Take 2 mg by mouth daily before breakfast.        . HYDROcodone-acetaminophen (VICODIN) 5-500 MG per tablet Take 1 tablet by mouth every 6 (six) hours as needed. For pain      . metoprolol tartrate (LOPRESSOR) 25 MG tablet Take 37.5 mg by mouth 2 (two) times daily. Hold for systolic blood pressure(top number) less than 110 ,and heart rate less than 60, take 1/2 tablet by mouth bid      . Nutritional Supplements (FEEDING SUPPLEMENT, NEPRO CARB STEADY,) LIQD Take 237 mLs by mouth 3 (three) times daily as needed.      . silver sulfADIAZINE (SILVADENE) 1 % cream Apply 1 application topically daily.      . traMADol (ULTRAM) 50  MG tablet Take 50 mg by mouth 2 (two) times daily as needed. pain      . warfarin (COUMADIN) 5 MG tablet Take 2.5-5 mg by mouth daily. Pt takes 2.5mg  Mon, Wed, Fri, Sat and 5mg  Tues, Thurs, Sun       Scheduled:    . allopurinol  150 mg Oral Daily  . aspirin EC  81 mg Oral Daily  . bacitracin   Topical Daily  . calcium carbonate  1 tablet Oral TID AC  . darbepoetin (ARANESP) injection - DIALYSIS  150 mcg Intravenous Q Sat-HD  . ezetimibe-simvastatin  1 tablet Oral QHS  . ferric gluconate (FERRLECIT/NULECIT) IV  62.5 mg Intravenous Q Thu-HD  . glimepiride  2 mg Oral QAC breakfast  . HYDROcodone-acetaminophen  1-2 tablet Oral BID  . insulin aspart  0-15 Units Subcutaneous TID WC  . metoprolol tartrate  37.5 mg Oral BID  . pantoprazole  40 mg Oral Q1200  . paricalcitol  4 mcg Intravenous Q T,Th,Sa-HD    Assessment: 78 yo male with h/o Afib/flutter on chronic coumadin prior to recent hospitalization on 07/16/11 for ischemic left leg.  His  coumadin dose PTA was 2.5mg  every MWFSat and 5mg  every TTSun.  He is s/p Left BKA POD#4.  His coumadin was resumed on 7/13 (MD dosing) with Lovenox 30mg  SQ q24h bridge. Today the INR is 1.58, subtherapeutic but is increasing toward goal of 2-3.  Currently no bleeding reported. Hgb 9.4 (preop 12.3) , pltc 251 yesterday. Albumin is 2.7  Past medical history as noted above includes ESRD,  started dialysis Oct. 2012.  Goal of Therapy:  INR 2-3 Monitor platelets by anticoagulation protocol: Yes   Plan:  Give Coumadin 5mg  po today x1 Monitor daily PT/INR.   Noah Delaine, RPh Clinical Pharmacist 07/24/2011,5:42 PM

## 2011-07-24 NOTE — Progress Notes (Signed)
Pt arrived to unit 1605. Family at bedside. Rehab booklet given and reviewed process with pt and family. Belongings at bedside. Call bell within reach and all questions answered at this time.

## 2011-07-24 NOTE — Procedures (Signed)
I was present at this dialysis session. I have reviewed the session itself and made appropriate changes.   Vinson Moselle, MD [employer] 07/24/2011, 12:20 PM

## 2011-07-24 NOTE — Progress Notes (Signed)
Overall Plan of Care [facility]) Patient Details Name: Alan [name] MRN: [id number] DOB: 03/26/1931  Diagnosis:  Left BKA  Primary Diagnosis:    Unilateral complete BKA Co-morbidities: ESRD, Afib, wound care, DM, HTN  Functional Problem List  Patient demonstrates impairments in the following areas: Bowel, Medication Management, Nutrition, Pain, Perception, Safety and Skin Integrity, Balance, Motor  Basic ADL's: bathing, dressing and toileting Advanced ADL's: not applicable  Transfers:  bed mobility, bed to chair, toilet, car and furniture Locomotion:  ambulation, wheelchair mobility and stairs  Additional Impairments:  None  Anticipated Outcomes Item Anticipated Outcome  Eating/Swallowing    Basic self-care  supervision  Tolieting  supervision  Bowel/Bladder    Transfers  Supervision to toilet Mod I basic transfers  Locomotion  150' mod I w/c Min @ 27' with RW  Communication    Cognition    Pain  3 or less  Safety/Judgment    Other  Pt will remain free of new skin breakdown and infection.    Therapy Plan: PT Frequency: 2-3 X/day, 60-90 minutes OT Frequency: 1-2 X/day, 60-90 minutes;5 out of 7 days     Team Interventions: Item RN PT OT SLP SW TR Other  Self Care/Advanced ADL Retraining   x      Neuromuscular Re-Education         Therapeutic Activities  x x      UE/LE Strength Training/ROM  x x      UE/LE Coordination Activities         Visual/Perceptual Remediation/Compensation         DME/Adaptive Equipment Instruction  x x      Therapeutic Exercise  x x      Balance/Vestibular Training  x x      Patient/Family Education x x x      Cognitive Remediation/Compensation         Functional Mobility Training  x x      Ambulation/Gait Training  x       Museum/gallery curator  x       Wheelchair Propulsion/Positioning  x       Functional Tourist information centre manager Reintegration  x       Dysphagia/Aspiration Chemical engineer         Bladder Management         Bowel Management         Disease Management/Prevention x x       Pain Management x        Medication Management x        Skin Care/Wound Management x        Splinting/Orthotics         Discharge Planning  x x  x    Psychosocial Support     x                       Team Discharge Planning: Destination:  Home Projected Follow-up:  PT, OT and Home Health Projected Equipment Needs:  Biomedical engineer involved in discharge planning:  Yes  MD ELOS: 10-12 days Medical Rehab Prognosis:  Excellent Assessment: Pt admitted for cir therapies. Goals are supervision except for mobility. Goals are Mod I for wc mobility and min assist for short dx gait with walker. The team will be focusing on ADL's, mobility, adaptive equipment training, safety, pain mgt, pre-prosthetic education.

## 2011-07-24 NOTE — Progress Notes (Signed)
Subjective:  Resting quietly, seen on HD; no complaints; excited about transferring to inpatient rehab  Vital signs in last 24 hours: Filed Vitals:   07/24/11 0930 07/24/11 1000 07/24/11 1030 07/24/11 1100  BP: 103/51 110/61 110/58 96/56  Pulse: 81 82 84 82  Temp:      TempSrc:      Resp: 18 18 18 18   Height:      Weight:      SpO2:       Weight change:  No intake or output data in the 24 hours ending 07/24/11 1115 Labs: Basic Metabolic Panel:  Lab 07/23/11 4696 07/21/11 0758 07/20/11 1859 07/20/11 0907  NA 133* 131* -- 138  K 4.6 5.1 -- 4.9  CL 93* 92* -- 93*  CO2 27 28 -- 33*  GLUCOSE 255* 207* -- 110*  BUN 44* 42* -- 29*  CREATININE 8.26* 7.61* 6.50* --  CALCIUM 8.8 8.8 -- 9.6  ALB -- -- -- --  PHOS 4.2 -- -- --   Liver Function Tests:  Lab 07/23/11 1242  AST --  ALT --  ALKPHOS --  BILITOT --  PROT --  ALBUMIN 2.7*   No results found for this basename: LIPASE:3,AMYLASE:3 in the last 168 hours No results found for this basename: AMMONIA:3 in the last 168 hours CBC:  Lab 07/23/11 1242 07/21/11 0758 07/20/11 1859 07/20/11 0907  WBC 10.0 10.6* 10.4 --  NEUTROABS -- -- -- --  HGB 9.4* 9.3* 11.5* --  HCT 29.9* 29.5* 36.3* --  MCV 91.4 91.3 92.4 92.9  PLT 251 228 255 --   Cardiac Enzymes: No results found for this basename: CKTOTAL:5,CKMB:5,CKMBINDEX:5,TROPONINI:5 in the last 168 hours CBG:  Lab 07/24/11 0604 07/23/11 1120 07/22/11 2041 07/22/11 1614 07/22/11 1102  GLUCAP 127* 219* 169* 140* 170*    Iron Studies: No results found for this basename: IRON,TIBC,TRANSFERRIN,FERRITIN in the last 72 hours Studies/Results: No results found. Medications:      . allopurinol  150 mg Oral Daily  . aspirin EC  81 mg Oral Daily  . calcium carbonate  1 tablet Oral TID AC  . darbepoetin (ARANESP) injection - DIALYSIS  150 mcg Intravenous Q Sat-HD  . docusate sodium  100 mg Oral Daily  . enoxaparin (LOVENOX) injection  30 mg Subcutaneous Q24H  .  ezetimibe-simvastatin  1 tablet Oral QHS  . glimepiride  2 mg Oral QAC breakfast  . insulin aspart  0-15 Units Subcutaneous TID WC  . metoprolol tartrate  37.5 mg Oral BID  . pantoprazole  40 mg Oral Q1200  . paricalcitol  4 mcg Intravenous Q T,Th,Sa-HD  . silver sulfADIAZINE  1 application Topical Daily  . warfarin  2.5 mg Oral Custom  . warfarin  5 mg Oral Custom  . Warfarin - Physician Dosing Inpatient   Does not apply q1800    I  have reviewed scheduled and prn medications.  Gen: Comfortable  CVS: normal S1 and S2; Aflutter (82) noted on monitor  Resp:diminished bases; otherwise CTA bilaterally, no adventitious B.S.  EXB:MWUX, obese, NT/ND, +BS  Ext: Left stump wrapped with ace/bulky kerlix dressing Dialysis Access: RUA AVF +bruit   Dialysis Prescription: 4 1/4hr, EDW 108.5, QB 500, QD AF 1.5, 2K/2.25Ca. RUA AVF 15G needles. Heparin 3400 Units. Zemplar TIW, No epogen. Venofer 100mg  IV Q 2weekly.   Assessment/Plan  1. PVD with ulceration- S/P Left BKA, 07/21/11 [place]- POD #3. Discharge to inpatient rehab pending today after HD.  2. ESRD: HD TTS SWAF; No  Heparin; K level 4.6 pre-tx; seen on HD today; s/p BKA and will need new eDW at time of discharge (previous EDW was 108.5kg); post-tx wt today pending.  3. Anemia: hgb 9.4; Aranesp restarted 07/21/11; previously on Venofer 100mg  q2wk; resume by giving 50mg  qwk; re-evaluate iron stores; follow trend  4. Metabolic Bone disease- phos 4.2, ca 8.8; on Tums, Zemplar; follow ca/phos closely  5. Hypertension- controlled on meds (metoprolol tartrate 37.5 bid) and UF with HD 6. DM/HLD- Vytorin, Amaryl; CBG and SSI; glucose 127 this am 7. Nutrition- albumin 2.7; ^ protein renal carb modified diet; supple prn 8. Afib/flutter-on Coumadin with INR 1.58; pharmacy following; Lovenox bridge 9. Gout- no problems on allopurinol 10. Disposition- discharged to inpatient rehab; continue follow with same oupt HD (TTS) schedule  [name], FNP-C [facility] Pager [phone]  07/24/2011,11:15 AM  LOS: 4 days   Patient seen and examined and agree with assessment and plan as above.  Vinson Moselle  MD [facility] [phone] pgr    [phone] cell 07/24/2011, 12:19 PM

## 2011-07-24 NOTE — Progress Notes (Signed)
Rehab admissions - Evaluated for possible admission.  I spoke with patient and called his wife.  They are in agreement to inpatient rehab admission.  Bed available and can admit to inpatient rehab today.  Call me for questions.  #[phone]

## 2011-07-25 ENCOUNTER — Inpatient Hospital Stay (HOSPITAL_COMMUNITY): Payer: MEDICARE | Admitting: Physical Therapy

## 2011-07-25 ENCOUNTER — Inpatient Hospital Stay (HOSPITAL_COMMUNITY): Payer: MEDICARE | Admitting: Occupational Therapy

## 2011-07-25 ENCOUNTER — Inpatient Hospital Stay (HOSPITAL_COMMUNITY): Payer: MEDICARE | Admitting: *Deleted

## 2011-07-25 DIAGNOSIS — S78119A Complete traumatic amputation at level between unspecified hip and knee, initial encounter: Secondary | ICD-10-CM

## 2011-07-25 DIAGNOSIS — I70269 Atherosclerosis of native arteries of extremities with gangrene, unspecified extremity: Secondary | ICD-10-CM

## 2011-07-25 DIAGNOSIS — Z5189 Encounter for other specified aftercare: Secondary | ICD-10-CM

## 2011-07-25 LAB — GLUCOSE, CAPILLARY
Glucose-Capillary: 78 mg/dL (ref 70–99)
Glucose-Capillary: 161 mg/dL — ABNORMAL HIGH (ref 70–99)
Glucose-Capillary: 90 mg/dL (ref 70–99)

## 2011-07-25 LAB — PROTIME-INR: Prothrombin Time: 18.8 seconds — ABNORMAL HIGH (ref 11.6–15.2)

## 2011-07-25 MED ORDER — SORBITOL 70 % SOLN
30.0000 mL | Status: DC | PRN
  Administered 2011-07-29 – 2011-07-30 (×2): 30 mL via ORAL
  Filled 2011-07-25 (×2): qty 30

## 2011-07-25 MED ORDER — HYDROXYZINE HCL 25 MG PO TABS
25.0000 mg | ORAL_TABLET | Freq: Three times a day (TID) | ORAL | Status: DC | PRN
  Filled 2011-07-25: qty 1

## 2011-07-25 MED ORDER — DOCUSATE SODIUM 283 MG RE ENEM
1.0000 | ENEMA | RECTAL | Status: DC | PRN
  Administered 2011-07-31: 56.6 mg via RECTAL
  Filled 2011-07-25: qty 1

## 2011-07-25 MED ORDER — ZOLPIDEM TARTRATE 5 MG PO TABS: 5.0000 mg | ORAL_TABLET | Freq: Every evening | ORAL | Status: DC | PRN

## 2011-07-25 MED ORDER — ACETAMINOPHEN 650 MG RE SUPP: 650.0000 mg | Freq: Four times a day (QID) | RECTAL | Status: DC | PRN

## 2011-07-25 MED ORDER — CALCIUM CARBONATE 1250 MG/5ML PO SUSP
500.0000 mg | Freq: Four times a day (QID) | ORAL | Status: DC | PRN
  Filled 2011-07-25: qty 5

## 2011-07-25 MED ORDER — CAMPHOR-MENTHOL 0.5-0.5 % EX LOTN
1.0000 "application " | TOPICAL_LOTION | Freq: Three times a day (TID) | CUTANEOUS | Status: DC | PRN
  Filled 2011-07-25: qty 222

## 2011-07-25 MED ORDER — WARFARIN SODIUM 5 MG PO TABS
5.0000 mg | ORAL_TABLET | Freq: Once | ORAL | Status: AC
  Administered 2011-07-25: 5 mg via ORAL
  Filled 2011-07-25: qty 1

## 2011-07-25 NOTE — Progress Notes (Signed)
Occupational Therapy Session Note  Patient Details  Name: Alan [name] MRN: [id number] Date of Birth: Jan 23, 1931  Today's Date: 07/25/2011 Time: 4098-1191 Time Calculation (min): 45 min  Skilled Therapeutic Interventions/Progress Updates:    Worked on UE strengthening and endurance for greater independence with selfcare tasks.  Used UE ergonometer for 12 mins, in 6 min intervals.  Resistance set at level 13 and  RPMs maintained at 24 per minute.  Performed 6 mins forward and then 6 mins in revers with 1-2 minute rest break in between.  Also performed one interval of sit to stand using the RW with mod facilitation from EOB, in attempt to stand pivot to the bedside chair.  Pt unable to maintain standing for more than 1 min secondary to the left residual limb throbbing.  Performed bed to wheelchair squat pivot with mod facilitation.   Therapy Documentation Precautions:  Precautions Precautions: Fall Restrictions Weight Bearing Restrictions: Yes LLE Weight Bearing: Non weight bearing   Pain: Pain Assessment Pain Assessment: 0-10 Pain Score:   5 Pain Type: Acute pain Pain Location: Leg Pain Orientation: Left Pain Descriptors: Throbbing Pain Frequency: Intermittent Pain Onset: With Activity Patients Stated Pain Goal: 2 Pain Intervention(s): Medication (See eMAR);Repositioned Multiple Pain Sites: No ADL:  Other Treatments:    See FIM for current functional status  Therapy/Group: Individual Therapy  Ricky Doan OTR/L  07/25/2011, 4:06 PM

## 2011-07-25 NOTE — Progress Notes (Signed)
Inpatient Rehabilitation Center Individual Statement of Services  Patient Name:  Alan [name]  Date:  07/25/2011  Welcome to the Inpatient Rehabilitation Center.  Our goal is to provide you with an individualized program based on your diagnosis and situation, designed to meet your specific needs.  With this comprehensive rehabilitation program, you will be expected to participate in at least 3 hours of rehabilitation therapies Monday-Friday, with modified therapy programming on the weekends.  Your rehabilitation program will include the following services:  Physical Therapy (PT), Occupational Therapy (OT), 24 hour per day rehabilitation nursing, Therapeutic Recreaction (TR), Case Management (RN and Child psychotherapist), Rehabilitation Medicine, Nutrition Services and Pharmacy Services  Weekly team conferences will be held on Wednesday to discuss your progress.  Your RN Case Designer, television/film set will talk with you frequently to get your input and to update you on team discussions.  Team conferences with you and your family in attendance may also be held.  Expected length of stay: 10-12 days  Overall anticipated outcome: supervision level goals  Depending on your progress and recovery, your program may change.  Your RN Case Estate agent will coordinate services and will keep you informed of any changes.  Your RN Sports coach and SW names and contact numbers are listed  below.  The following services may also be recommended but are not provided by the Inpatient Rehabilitation Center:   Driving Evaluations  Home Health Rehabiltiation Services  Outpatient Rehabilitatation [facility]  Vocational Rehabilitation   Arrangements will be made to provide these services after discharge if needed.  Arrangements include referral to agencies that provide these services.  Your insurance has been verified to be:  MEDICARE-RAILROAD, AARP Your primary doctor is:  DR Hali Marry  Pertinent information will be shared with your doctor and your insurance company.  Case Manager: Social Worker:  Lorenza Burton  [phone]  Information discussed with and copy given to patient by: Lucy Chris, 07/25/2011, 11:41 AM

## 2011-07-25 NOTE — Progress Notes (Signed)
Social Work Assessment and Plan Social Work Assessment and Plan  Patient Details  Name: Alan [name] MRN: [id number] Date of Birth: May 03, 1931  Today's Date: 07/25/2011  Problem List:  Patient Active Problem List  Diagnosis  . Campath-induced atrial fibrillation  . Encounter for long-term (current) use of anticoagulants  . Aortic stenosis  . CAD (coronary artery disease)  . End stage renal disease on dialysis  . Sepsis  . Healthcare-associated pneumonia  . Toxic metabolic encephalopathy  . Bacteremia due to Gram-positive bacteria  . Dehydration  . Diabetes mellitus  . Chronic anticoagulation  . Hypotension  . Atherosclerosis of native arteries of the extremities with ulceration  . Unilateral complete BKA-right   Past Medical History:  Past Medical History  Diagnosis Date  . Diabetes mellitus   . Gout   . Hypertension   . Hyperlipidemia   . Chronic kidney disease     Started dialysis October 2012  . Arthritis   . Prostate cancer     s/p seed implant  . Cataract     BILATERAL-BEEN REMOVED  . Neuromuscular disorder     DABETIC NEUROPATHY-LOWER EXTREMITY  . Atrial fibrillation     on coumadin  . Aortic stenosis     moderate to severe, not felt to be a surgical candidate  . Anemia     secondary to end stage renal disease  . Coronary artery disease     remote CABG in 1996; last stress test in 2005; last cath 2005  . Diabetic neuropathy   . Edema   . PAD (peripheral artery disease)   . Diabetic retinopathy   . Chronic anticoagulation     on coumadin  . Nephrolithiasis     prior history of percutaneous nephrostomy  . Shortness of breath    Past Surgical History:  Past Surgical History  Procedure Date  . Ventral hernia repair   . Inguinal hernia repair     right side  . Colonoscopy   . Kidney stone surgery   . Polypectomy   . Tonsillectomy   . Toe surgery     removal little toe right foot  . Cardiac catheterization 04/19/2003    SEVERE 2 VESSEL  OBSTRUCTIVE ATHERSCLEROTIC CAD  . Coronary artery bypass graft 1996    LIMA GRAFT TO THE LAD, SEQUENTIAL SAPHENOUS  VEIN GRAFT TO THE FIRST AND SECOND DIAGONIAL BRANCHES, SEQUENTIAL SAPHENOUS VEIN GRAFT TO THE ACUTE MARGINAL, POSTERIOR DESCENDING, AND POSTERIOR LATERAL BRANCHES OF THE RIGHT CORONARY ARTERY  . Radioactive seed implant   . US echocardiography 09/06/2009    EF 55-60%  . Cardiovascular stress test 04/13/2003    EF 54%. EVIDENCE OF ANTERO-APICAL ISCHEMIA. NORMAL LV SIZE AND FUNCTION  . Tee without cardioversion 05/09/2011    Procedure: TRANSESOPHAGEAL ECHOCARDIOGRAM (TEE);  Surgeon: Laurey Morale, MD;  Location[facility] ENDOSCOPY;  Service: Cardiovascular;  Laterality: N/A;  . Hernia repair   . Amputation 07/20/2011    Procedure: AMPUTATION BELOW KNEE;  Surgeon: Nada Libman, MD;  Location: [facility] OR;  Service: Vascular;  Laterality: Left;   Social History:  reports that he quit smoking about 43 years ago. His smoking use included Cigarettes. He quit after 2 years of use. He has quit using smokeless tobacco. His smokeless tobacco use included Chew. He reports that he does not drink alcohol or use illicit drugs.  Family / Support Systems Marital Status: Married Patient Roles: Spouse;Parent Spouse/Significant Other: [name] [phone] Children: Jari Favre [phone] Anticipated Caregiver: Wife and step-son Ability/Limitations of  Caregiver: Wife is limited physically, son is ther and can assist if necessary Caregiver Availability: 24/7 Family Dynamics: Close knit fmaily-blended family.  Pt reports they all get a long and will assist one another.  He reports: " Take day at a time."  Social History Preferred language: English Religion: Baptist Cultural Background: No issues Education: [employer] Read: Yes Write: Yes Employment Status: Retired Fish farm manager Issues: No issues Guardian/Conservator: None-pt capable to [employer] own decisions according to MD    Abuse/Neglect Physical Abuse: Denies Verbal Abuse: Denies Sexual Abuse: Denies Exploitation of patient/patient's resources: Denies Self-Neglect: Denies  Emotional Status Pt's affect, behavior adn adjustment status: Pt is motivated to imporvwe and get as independent as possible.  he has always taken care of himself and wants to do this again.  Pain is the main issue with this surgery.  But he is trying to move forward. Recent Psychosocial Issues: other medical issues Pyschiatric History: No history- deprssion screen score-2.  He reprots if the pain was better he would be able to do more, but they are working on this. Substance Abuse History: No issues  Patient / Family Perceptions, Expectations & Goals Pt/Family understanding of illness & functional limitations: Pt is able to explain his amputation and deficits as a result.  he is motivated to do well here and get as functional as possible while here. Premorbid pt/family roles/activities: Husband, father, Retiree, Church member, dialysis pt, etc Anticipated changes in roles/activities/participation: Plans to resume these roles Pt/family expectations/goals: Pt states: " I want to do for myself, not burden my wife, she will do it, but I want to."  He expects to be mod/i wheelchair level by discharge  [employer]: Other (Comment) ([facility] HD-T, Th,Sat) Premorbid Home Care/DME Agencies: None Transportation available at discharge: Wife and step-son Resource referrals recommended: Support group (specify) (Amputee Support group)  Discharge Planning Living Arrangements: Spouse/significant other;Children Support Systems: Spouse/significant other;Children;Friends/neighbors;Church/faith community Type of Residence: Private residence Insurance Resources: Administrator (specify) Youth worker) Financial Resources: Restaurant manager, fast food Screen Referred: No Living Expenses: Lives with family Money  Management: Patient;Spouse Do you have any problems obtaining your medications?: No Home Management: Wife Patient/Family Preliminary Plans: Return home with wife and step-son who can assist. Doneen Poisson can build a ramp if needed.  wife has some health sisues but can do some for pt. Social Work Anticipated Follow Up Needs: HH/OP;Support Group  Clinical Impression Pleasant gentleman who is motivated to improve and do well here.  Wife and step-son can assist at home and will be in to observe in therapies. Pain is the main issue for pt but is working though this.  Lucy Chris 07/25/2011, 11:34 AM

## 2011-07-25 NOTE — Progress Notes (Signed)
Physical Therapy Session Note  Patient Details  Name: Alan [name] MRN: [id number] Date of Birth: [birth year]  Today's Date: 07/25/2011 Time: 1130-1202 Time Calculation (min): 32 min  Short Term Goals: Week 1:  PT Short Term Goal 1 (Week 1): Same as LTGs  Skilled Therapeutic Interventions/Progress Updates:  W/c propulsion on the unit, 180'x 2, with UEs.  Pt's endurance for this much improved from am.  Gave pt written HEP for BKA exercises.  Pt performed 10 reps of 3 exercises.  Discussed therapy goals and pt agreed with no concerns about discharge. Therapy Documentation Precautions:  Precautions Precautions: Fall Restrictions Weight Bearing Restrictions: Yes LLE Weight Bearing: Non weight bearing Pain: Pt reported pain improved since am session now that pain meds are working.  See FIM for current functional status  Therapy/Group: Individual Therapy  Georges Mouse 07/25/2011, 12:08 PM

## 2011-07-25 NOTE — Progress Notes (Signed)
Subjective/Complaints: Left leg throbbing. Pain meds usally help. A 12 point review of systems has been performed and if not noted above is otherwise negative.   Objective: Vital Signs: Blood pressure 110/62, pulse 78, temperature 98.2 F (36.8 C), temperature source Oral, resp. rate 18, height 6' (1.829 m), weight 103.3 kg (227 lb 11.8 oz), SpO2 100.00%. No results found.  Basename 07/23/11 1242  WBC 10.0  HGB 9.4*  HCT 29.9*  PLT 251    Basename 07/23/11 1242  NA 133*  K 4.6  CL 93*  CO2 27  GLUCOSE 255*  BUN 44*  CREATININE 8.26*  CALCIUM 8.8   CBG (last 3)   Basename 07/25/11 0724 07/24/11 2053 07/24/11 1634  GLUCAP 78 132* 272*    Wt Readings from Last 3 Encounters:  07/24/11 103.3 kg (227 lb 11.8 oz)  07/24/11 103.3 kg (227 lb 11.8 oz)  07/24/11 103.3 kg (227 lb 11.8 oz)    Physical Exam:   General: Alert and oriented x 3, No apparent distress HEENT: Head is normocephalic, atraumatic, PERRLA, EOMI, sclera anicteric, oral mucosa pink and moist, dentition intact, ext ear canals clear,  Neck: Supple without JVD or lymphadenopathy Heart: IrregReg rate and rhythm. SE murmurs. No  rubs or gallops Chest: CTA bilaterally without wheezes, rales, or rhonchi; no distress Abdomen: Soft, non-tender, non-distended, bowel sounds positive. Extremities: No clubbing, cyanosis, or edema. Pulses are 2+ Skin: BK is intact with minimal drainage. Neuro: Pt is cognitively appropriate with normal insight, memory, and awareness. Cranial nerves 2-12 are intact. Sensory exam is normal except for distal extremities. Reflexes are 2+ in all 4's. Fine motor coordination is intact. No tremors. Motor function is grossly 5/5 except for LLE which is 3/5.  Musculoskeletal: Full ROM, No pain with AROM or PROM in the neck, trunk, or extremities. Posture appropriate Psych: Pt's affect is appropriate. Pt is cooperative    Assessment/Plan: 1. Functional deficits secondary to left BKA which  require 3+ hours per day of interdisciplinary therapy in a comprehensive inpatient rehab setting. Physiatrist is providing close team supervision and 24 hour management of active medical problems listed below. Physiatrist and rehab team continue to assess barriers to discharge/monitor patient progress toward functional and medical goals. FIM:                   Comprehension Comprehension Mode: Auditory Comprehension: 7-Follows complex conversation/direction: With no assist  Expression Expression Mode: Verbal Expression: 7-Expresses complex ideas: With no assist  Social Interaction Social Interaction: 7-Interacts appropriately with others - No medications needed.  Problem Solving Problem Solving: 6-Solves complex problems: With extra time    Medical Problem List and Plan:  1. DVT Prophylaxis/Anticoagulation: Pharmaceutical: Coumadin  2. Pain Management: Will schedule pain medication prior to therapy schedule. Discussed pain mgt in the setting of a BKA with this patient 3. Mood: A little irritated about food choices, pain management, etc. Ego support provided. Continue to monitor.  4. Neuropsych: This patient is capable of making decisions on his/her own behalf.  5. ESRD: Nephrology to assist with HD needs. Continue 3 X week.  7. Acute on chronic anemia: Routine labs with HD. Continue aranesp weekly.  8. Right toe ulcer: continue silvadene cream daily. PRAFO for protection when in bed.  9. HTN: monitor with BID checks. Continue lopressor.  10. Dyslipidemia: continue vytorin.  11. DM type 2: Monitor BS with bid checks. Continue Amaryl daily. Use SSI for tighter BS control.  12. Constipation:  suppository prn.  13. A  Fib: Continue to check HR on bid basis. Continue lopressor. Continue chronic coumadin.  14. CAD with moderate to severe aortic stenosis: monitor for symptoms with increase in activity levels. Continue lopressor, vytorin and coumadin   LOS (Days) 1 A FACE TO  FACE EVALUATION WAS PERFORMED   07/25/2011, 9:04 AM

## 2011-07-25 NOTE — Evaluation (Signed)
Occupational Therapy Assessment and Plan and Skilled Therapy Intervention  Patient Details  Name: Alan [name] MRN: [id number] Date of Birth: 1931-07-08  OT Diagnosis: muscle weakness (generalized) Rehab Potential: Rehab Potential: Good ELOS: 10-12 days   Today's Date: 07/25/2011 Time: 0800-0905 Time Calculation (min): 65 min  1:1 Pt seen for initial evaluation and ADL retraining of toileting, bathing at sink level and dressing.  Pt participated well, needing min to mod assist with scoot transfers to [facility] and to w/c.  He became fatigued after attempting to push up into standing and performing partial stands by pushing up from arm rests.  Problem List:  Patient Active Problem List  Diagnosis  . Campath-induced atrial fibrillation  . Encounter for long-term (current) use of anticoagulants  . Aortic stenosis  . CAD (coronary artery disease)  . End stage renal disease on dialysis  . Sepsis  . Healthcare-associated pneumonia  . Toxic metabolic encephalopathy  . Bacteremia due to Gram-positive bacteria  . Dehydration  . Diabetes mellitus  . Chronic anticoagulation  . Hypotension  . Atherosclerosis of native arteries of the extremities with ulceration  . Unilateral complete BKA-right    Past Medical History:  Past Medical History  Diagnosis Date  . Diabetes mellitus   . Gout   . Hypertension   . Hyperlipidemia   . Chronic kidney disease     Started dialysis October 2012  . Arthritis   . Prostate cancer     s/p seed implant  . Cataract     BILATERAL-BEEN REMOVED  . Neuromuscular disorder     DABETIC NEUROPATHY-LOWER EXTREMITY  . Atrial fibrillation     on coumadin  . Aortic stenosis     moderate to severe, not felt to be a surgical candidate  . Anemia     secondary to end stage renal disease  . Coronary artery disease     remote CABG in 1996; last stress test in 2005; last cath 2005  . Diabetic neuropathy   . Edema   . PAD (peripheral artery disease)   .  Diabetic retinopathy   . Chronic anticoagulation     on coumadin  . Nephrolithiasis     prior history of percutaneous nephrostomy  . Shortness of breath    Past Surgical History:  Past Surgical History  Procedure Date  . Ventral hernia repair   . Inguinal hernia repair     right side  . Colonoscopy   . Kidney stone surgery   . Polypectomy   . Tonsillectomy   . Toe surgery     removal little toe right foot  . Cardiac catheterization 04/19/2003    SEVERE 2 VESSEL OBSTRUCTIVE ATHERSCLEROTIC CAD  . Coronary artery bypass graft 1996    LIMA GRAFT TO THE LAD, SEQUENTIAL SAPHENOUS  VEIN GRAFT TO THE FIRST AND SECOND DIAGONIAL BRANCHES, SEQUENTIAL SAPHENOUS VEIN GRAFT TO THE ACUTE MARGINAL, POSTERIOR DESCENDING, AND POSTERIOR LATERAL BRANCHES OF THE RIGHT CORONARY ARTERY  . Radioactive seed implant   . US echocardiography 09/06/2009    EF 55-60%  . Cardiovascular stress test 04/13/2003    EF 54%. EVIDENCE OF ANTERO-APICAL ISCHEMIA. NORMAL LV SIZE AND FUNCTION  . Tee without cardioversion 05/09/2011    Procedure: TRANSESOPHAGEAL ECHOCARDIOGRAM (TEE);  Surgeon: Laurey Morale, MD;  Location: [facility] ENDOSCOPY;  Service: Cardiovascular;  Laterality: N/A;  . Hernia repair   . Amputation 07/20/2011    Procedure: AMPUTATION BELOW KNEE;  Surgeon: Nada Libman, MD;  Location: [facility] OR;  Service: Vascular;  Laterality: Left;    Assessment & Plan Clinical Impression: Alan [name] is a 78 y.o. male with ESRD, DM, BLE ulcers with PVD and admitted on 07/20/11 for L-BKA by [name]. Post op hemodialysis ongoing. Acute on chronic anemia treated with ESA. constipation resolving. Pain control remains an issue.  Patient transferred to CIR on 07/24/2011 .    Patient currently requires max with basic self-care skills secondary to muscle weakness.  Prior to hospitalization, patient could complete basic ADLs.  Patient will benefit from skilled intervention to increase independence with basic self-care skills  prior to discharge home with care partner.  Anticipate patient will require intermittent supervision and follow up home health.  OT - End of Session Activity Tolerance: Tolerates 10 - 20 min activity with multiple rests Endurance Deficit: Yes Endurance Deficit Description: becomes fatigued with sit to stand movements OT Assessment Rehab Potential: Good Barriers to Discharge: None OT Plan OT Frequency: 1-2 X/day, 60-90 minutes;5 out of 7 days Estimated Length of Stay: 10-12 days OT Treatment/Interventions: Balance/vestibular training;Discharge planning;DME/adaptive equipment instruction;Functional mobility training;Patient/family education;Self Care/advanced ADL retraining;Therapeutic Activities;Therapeutic Exercise;UE/LE Strength taining/ROM OT Recommendation Follow Up Recommendations: Home health OT Equipment Recommended: Tub/shower bench  OT Evaluation Precautions/Restrictions  Precautions Precautions: Fall   Pain Pain Assessment Pain Assessment: 0-10 Pain Score:   4 Pain Type: Surgical pain Pain Location: Leg Pain Orientation: Left Pain Descriptors: Throbbing Pain Onset: Gradual Patients Stated Pain Goal: 2 Pain Intervention(s): Other (Comment) (pt stated he will wait for pain meds) Home Living/Prior Functioning Home Living Lives With: Spouse;Son Available Help at Discharge: Family;Available 24 hours/day Type of Home: House Home Access: Stairs to enter [employer] of Steps: 1 Entrance Stairs-Rails: Right;Left Home Layout: One level Bathroom Shower/Tub: Health visitor: Standard Bathroom Accessibility: Yes How Accessible: Accessible via walker Home Adaptive Equipment: Bedside commode/3-in-1;Walker - rolling;Straight cane IADL History Homemaking Responsibilities: No Mode of Transportation: Car Occupation: Retired Prior Function Level of Independence: Independent with basic ADLs;Requires assistive device for independence ADL Refer to  FIM   Vision/Perception  Vision - History Baseline Vision: Wears glasses only for reading Patient Visual Report: No change from baseline Vision - Assessment Eye Alignment: Within Functional Limits Perception Perception: Within Functional Limits Praxis Praxis: Intact  Cognition Overall Cognitive Status: Appears within functional limits for tasks assessed Sensation Sensation Light Touch: Appears Intact Stereognosis: Appears Intact Hot/Cold: Appears Intact Proprioception: Appears Intact Coordination Gross Motor Movements are Fluid and Coordinated: Yes Fine Motor Movements are Fluid and Coordinated: Yes Motor  Motor Motor - Skilled Clinical Observations: Generalized motor weakness with sit to stand or to be able to stand Mobility  Bed Mobility Left Sidelying to Sit: 5: Supervision;HOB elevated;With rails Sitting - Scoot to Edge of Bed: 5: Supervision Transfers Sit to Stand: 2: Max assist  Trunk/Postural Assessment  Cervical Assessment Cervical Assessment: Within Functional Limits Thoracic Assessment Thoracic Assessment: Within Functional Limits Lumbar Assessment Lumbar Assessment: Within Functional Limits Postural Control Postural Control: Within Functional Limits  Balance Balance Balance Assessed: Yes Static Sitting Balance Static Sitting - Level of Assistance: 7: Independent Dynamic Sitting Balance Dynamic Sitting - Level of Assistance: 5: Stand by assistance Static Standing Balance Static Standing - Level of Assistance: 1: +1 Total assist Dynamic Standing Balance Dynamic Standing - Level of Assistance: Not tested (comment) Extremity/Trunk Assessment RUE Assessment RUE Assessment: Within Functional Limits LUE Assessment LUE Assessment: Within Functional Limits  See FIM for current functional status Refer to Care Plan for Long Term Goals  Recommendations for other services: None  Discharge Criteria: Patient will be discharged from OT if patient refuses  treatment 3 consecutive times without medical reason, if treatment goals not met, if there is a change in medical status, if patient makes no progress towards goals or if patient is discharged from hospital.  The above assessment, treatment plan, treatment alternatives and goals were discussed and mutually agreed upon: by patient  [facility] 07/25/2011, 9:18 AM

## 2011-07-25 NOTE — Progress Notes (Signed)
Patient information reviewed and entered into [employer]DS-PRO system by Sanika Brosious, RN, CRRN, PPS Coordinator.  Information including medical coding and functional independence measure will be reviewed and updated through discharge.     Per nursing patient was given "Data Collection Information Summary for Patients in Inpatient Rehabilitation Facilities with attached "Privacy Act Statement-Health Care Records" upon admission.   

## 2011-07-25 NOTE — Patient Care Conference (Signed)
Inpatient RehabilitationTeam Conference Note Date: 07/25/2011   Time: 10:30 AM    Patient Name: Alan [name]      Medical Record Number: [id number]  Date of Birth: 1931-05-19 Sex: Male         Room/Bed: 4039/4039-01 Payor Info: Payor: MEDICARE RAILROAD  Plan: MEDICARE RAILROAD  Product Type: *No Product type*     Admitting Diagnosis: LT BKA  Admit Date/Time:  07/24/2011  4:11 PM Admission Comments: No comment available   Primary Diagnosis:  Unilateral complete BKA Principal Problem: Unilateral complete BKA  Patient Active Problem List   Diagnosis Date Noted  . Unilateral complete BKA-right 07/25/2011  . Atherosclerosis of native arteries of the extremities with ulceration 06/18/2011  . Bacteremia due to Gram-positive bacteria 05/04/2011  . Dehydration 05/04/2011  . Diabetes mellitus 05/04/2011  . Chronic anticoagulation 05/04/2011  . Hypotension 05/04/2011  . Sepsis 05/03/2011  . Healthcare-associated pneumonia 05/03/2011  . Toxic metabolic encephalopathy 05/03/2011  . Aortic stenosis 11/22/2010  . CAD (coronary artery disease) 11/22/2010  . End stage renal disease on dialysis 11/22/2010  . Campath-induced atrial fibrillation 11/16/2010  . Encounter for long-term (current) use of anticoagulants 11/16/2010    Expected Discharge Date:  Not set  Team Members Present: Physician: Dr. Faith Rogue Case Manager Present: Lutricia Horsfall, RN Social Worker Present: Dossie Der, LCSW PT Present: Edson Snowball, Judith Blonder, PTA OT Present: Bretta Bang, Verlene Mayer, OT SLP Present: Fae Pippin, SLP     Current Status/Progress Goal Weekly Team Focus  Medical   left bka, pain issues, ESRD  minimize pain, wound care  see above   Bowel/Bladder             Swallow/Nutrition/ Hydration             ADL's   max toileting, mod LB dressing, min bathing, min-mod scoot transfers  supervision  ADL and functional mobility retraining, limb wrapping,  strenghtening, pt education   Mobility             Communication             Safety/Cognition/ Behavioral Observations            Pain             Skin                *See Interdisciplinary Assessment and Plan and progress notes for long and short-term goals  Barriers to Discharge: none identified yet    Possible Resolutions to Barriers:  TBD    Discharge Planning/Teaching Needs:         Team Discussion:  New admission -- Evals in progress today. Discussed pt's dx, hx.      Continued Need for Acute Rehabilitation Level of Care: The patient requires daily medical management by a physician with specialized training in physical medicine and rehabilitation for the following conditions: Daily direction of a multidisciplinary physical rehabilitation program to ensure safe treatment while eliciting the highest outcome that is of practical value to the patient.: Yes Daily medical management of patient stability for increased activity during participation in an intensive rehabilitation regime.: Yes Daily analysis of laboratory values and/or radiology reports with any subsequent need for medication adjustment of medical intervention for : Post surgical problems;Other  Meryl Dare 07/25/2011, 11:28 AM

## 2011-07-25 NOTE — Evaluation (Signed)
Physical Therapy Assessment and Plan  Patient Details  Name: Alan [name] MRN: [id number] Date of Birth: 1931-10-09  PT Diagnosis: Difficulty walking and Muscle weakness Rehab Potential: Good ELOS: 10-12 days   Today's Date: 07/25/2011 Time: 4098-1191 Time Calculation (min): 50 min  Problem List:  Patient Active Problem List  Diagnosis  . Campath-induced atrial fibrillation  . Encounter for long-term (current) use of anticoagulants  . Aortic stenosis  . CAD (coronary artery disease)  . End stage renal disease on dialysis  . Sepsis  . Healthcare-associated pneumonia  . Toxic metabolic encephalopathy  . Bacteremia due to Gram-positive bacteria  . Dehydration  . Diabetes mellitus  . Chronic anticoagulation  . Hypotension  . Atherosclerosis of native arteries of the extremities with ulceration  . Unilateral complete BKA-right    Past Medical History:  Past Medical History  Diagnosis Date  . Diabetes mellitus   . Gout   . Hypertension   . Hyperlipidemia   . Chronic kidney disease     Started dialysis October 2012  . Arthritis   . Prostate cancer     s/p seed implant  . Cataract     BILATERAL-BEEN REMOVED  . Neuromuscular disorder     DABETIC NEUROPATHY-LOWER EXTREMITY  . Atrial fibrillation     on coumadin  . Aortic stenosis     moderate to severe, not felt to be a surgical candidate  . Anemia     secondary to end stage renal disease  . Coronary artery disease     remote CABG in 1996; last stress test in 2005; last cath 2005  . Diabetic neuropathy   . Edema   . PAD (peripheral artery disease)   . Diabetic retinopathy   . Chronic anticoagulation     on coumadin  . Nephrolithiasis     prior history of percutaneous nephrostomy  . Shortness of breath    Past Surgical History:  Past Surgical History  Procedure Date  . Ventral hernia repair   . Inguinal hernia repair     right side  . Colonoscopy   . Kidney stone surgery   . Polypectomy   .  Tonsillectomy   . Toe surgery     removal little toe right foot  . Cardiac catheterization 04/19/2003    SEVERE 2 VESSEL OBSTRUCTIVE ATHERSCLEROTIC CAD  . Coronary artery bypass graft 1996    LIMA GRAFT TO THE LAD, SEQUENTIAL SAPHENOUS  VEIN GRAFT TO THE FIRST AND SECOND DIAGONIAL BRANCHES, SEQUENTIAL SAPHENOUS VEIN GRAFT TO THE ACUTE MARGINAL, POSTERIOR DESCENDING, AND POSTERIOR LATERAL BRANCHES OF THE RIGHT CORONARY ARTERY  . Radioactive seed implant   . US echocardiography 09/06/2009    EF 55-60%  . Cardiovascular stress test 04/13/2003    EF 54%. EVIDENCE OF ANTERO-APICAL ISCHEMIA. NORMAL LV SIZE AND FUNCTION  . Tee without cardioversion 05/09/2011    Procedure: TRANSESOPHAGEAL ECHOCARDIOGRAM (TEE);  Surgeon: Laurey Morale, MD;  Location: [facility] ENDOSCOPY;  Service: Cardiovascular;  Laterality: N/A;  . Hernia repair   . Amputation 07/20/2011    Procedure: AMPUTATION BELOW KNEE;  Surgeon: Nada Libman, MD;  Location: [facility] OR;  Service: Vascular;  Laterality: Left;    Assessment & Plan Clinical Impression: Patient is a 78 y.o. year old male with recent admission to the hospital on 07/20/11 with L BKA on 07/21/11.  Patient transferred to CIR on 07/24/2011 .   Patient currently requires min with mobility secondary to muscle weakness and decreased standing balance and decreased balance strategies.  Prior to hospitalization, patient was modified independent with mobility and lived with Spouse in a House home.  Home access is 3Stairs to enter.  Patient will benefit from skilled PT intervention to maximize safe functional mobility, minimize fall risk and decrease caregiver burden for planned discharge home with 24 hour assist.  Anticipate patient will benefit from follow up [facility] at discharge.  PT - End of Session Activity Tolerance: Tolerates 30+ min activity without fatigue Endurance Deficit: Yes Endurance Deficit Description: becomes fatigued with sit to stand movements PT Assessment Rehab Potential:  Good Barriers to Discharge: Inaccessible home environment (? w/c access) PT Plan PT Frequency: 2-3 X/day, 60-90 minutes Estimated Length of Stay: 10-12 days PT Treatment/Interventions: Ambulation/gait training;Balance/vestibular training;Community reintegration;Discharge planning;Disease management/prevention;DME/adaptive equipment instruction;Functional mobility training;Pain management;Patient/family education;Stair training;Therapeutic Activities;Therapeutic Exercise;UE/LE Strength taining/ROM;Wheelchair propulsion/positioning PT Recommendation Follow Up Recommendations: Home health PT (op pt with appropriate) Equipment Recommended: Rolling walker with 5" wheels;Wheelchair cushion (measurements);Wheelchair (measurements)  PT Evaluation Precautions/Restrictions Precautions Precautions: Fall Pain Pain Assessment Pain Assessment: 0-10 Pain Score:   8 Pain Type: Acute pain;Phantom pain Pain Location: Leg Pain Orientation: Left Pain Descriptors: Throbbing Pain Onset: With Activity (increased with standing) Patients Stated Pain Goal: 2 Pain Intervention(s): Medication (See eMAR) (given during therapy session) Home Living/Prior Functioning Home Living Lives With: Spouse Available Help at Discharge: Family Type of Home: House Home Access: Stairs to enter Secretary/administrator of Steps: 3 Entrance Stairs-Rails: Can reach both Home Layout: One level Bathroom Shower/Tub: Health visitor: Standard Bathroom Accessibility: Yes How Accessible:  (Pt thinks that a w/c may fit around house.) Home Adaptive Equipment: Walker - rolling (rollator) Prior Function Level of Independence: Requires assistive device for independence;Independent with gait;Independent with transfers;Independent with basic ADLs Able to Take Stairs?: Yes Driving: No Vocation: Retired Leisure: Hobbies-yes (Comment) Comments: 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 Vision/Perception  Vision - History Baseline Vision: Wears glasses only for reading Patient Visual Report: No change from baseline Vision - Assessment Eye Alignment: Within Functional Limits Perception Perception: Within Functional Limits Praxis Praxis: Intact  Cognition Overall Cognitive Status: Appears within functional limits for tasks assessed Sensation Sensation Light Touch: Appears Intact (in bilateral LEs) Stereognosis: Appears Intact Hot/Cold: Appears Intact Proprioception: Appears Intact Coordination Gross Motor Movements are Fluid and Coordinated: Yes Fine Motor Movements are Fluid and Coordinated: Yes Motor  Motor Motor: Within Functional Limits Motor - Skilled Clinical Observations: Generalized motor weakness with sit to stand or to be able to stand  Mobility Bed Mobility Left Sidelying to Sit: 5: Supervision;HOB elevated;With rails Sitting - Scoot to Edge of Bed: 5: Supervision Transfers Sit to Stand: 4: Min guard Stand to Sit: 4: Min guard Scooting transfers w/c to mat with min@ Locomotion  Ambulation Ambulation: No  Unable due to pain in LLE W/c propulsion with UEs x 40' with supervision x 2, UEs then fatigued. Trunk/Postural Assessment  Cervical Assessment Cervical Assessment: Within Functional Limits Thoracic Assessment Thoracic Assessment: Within Functional Limits Lumbar Assessment Lumbar Assessment: Within Functional Limits Postural Control Postural Control: Within Functional Limits  Balance Balance Balance Assessed: Yes Static Sitting Balance Static Sitting - Balance Support: No upper extremity supported Static Sitting - Level of Assistance: 7: Independent Dynamic Sitting Balance Dynamic Sitting - Level of Assistance: 5: Stand by assistance Static Standing Balance Static Standing - Level of Assistance: 4: Min assist (with RW) Dynamic Standing Balance Dynamic Standing - Level of Assistance: Not tested (comment) Extremity Assessment  RUE Assessment RUE Assessment: Within Functional Limits LUE Assessment LUE Assessment: Within Functional Limits RLE Assessment RLE Assessment: Exceptions to [facility] RLE Strength RLE Overall Strength: Deficits (grossly 4/5 at hip and knee, ankle 3-/5) LLE Assessment LLE Assessment: Not tested (due to pain)  See FIM for current functional status Refer to Care Plan for Long Term Goals  Recommendations for other services: None  Discharge Criteria: Patient will be discharged from PT if patient refuses treatment 3 consecutive times without medical reason, if treatment goals not met, if there is a change in medical status, if patient makes no progress towards goals or if patient is discharged from hospital.  The above assessment, treatment plan, treatment alternatives and goals were discussed and mutually agreed upon: by patient  Georges Mouse 07/25/2011, 10:31 AM

## 2011-07-25 NOTE — Progress Notes (Signed)
Subjective:  Feeling fine; no complaints; family @ bedside  Vital signs in last 24 hours: Filed Vitals:   07/24/11 1640 07/24/11 2056 07/25/11 0536  BP: 118/75 124/74 110/62  Pulse: 84 88 78  Temp: 99.7 F (37.6 C)  98.2 F (36.8 C)  TempSrc: Oral  Oral  Resp: 18  18  Height: 6' (1.829 m)    Weight: 103.3 kg (227 lb 11.8 oz)    SpO2: 95%  100%   Weight change:   Intake/Output Summary (Last 24 hours) at 07/25/11 1207 Last data filed at 07/25/11 0828  Gross per 24 hour  Intake    360 ml  Output      0 ml  Net    360 ml   Labs: Basic Metabolic Panel:  Lab 07/23/11 1610 07/21/11 0758 07/20/11 1859 07/20/11 0907  NA 133* 131* -- 138  K 4.6 5.1 -- 4.9  CL 93* 92* -- 93*  CO2 27 28 -- 33*  GLUCOSE 255* 207* -- 110*  BUN 44* 42* -- 29*  CREATININE 8.26* 7.61* 6.50* --  CALCIUM 8.8 8.8 -- 9.6  ALB -- -- -- --  PHOS 4.2 -- -- --   Liver Function Tests:  Lab 07/23/11 1242  AST --  ALT --  ALKPHOS --  BILITOT --  PROT --  ALBUMIN 2.7*   No results found for this basename: LIPASE:3,AMYLASE:3 in the last 168 hours No results found for this basename: AMMONIA:3 in the last 168 hours CBC:  Lab 07/23/11 1242 07/21/11 0758 07/20/11 1859 07/20/11 0907  WBC 10.0 10.6* 10.4 --  NEUTROABS -- -- -- --  HGB 9.4* 9.3* 11.5* --  HCT 29.9* 29.5* 36.3* --  MCV 91.4 91.3 92.4 92.9  PLT 251 228 255 --   Cardiac Enzymes: No results found for this basename: CKTOTAL:5,CKMB:5,CKMBINDEX:5,TROPONINI:5 in the last 168 hours CBG:  Lab 07/25/11 1129 07/25/11 0724 07/24/11 2053 07/24/11 1634 07/24/11 1245  GLUCAP 161* 78 132* 272* 139*    Iron Studies: No results found for this basename: IRON,TIBC,TRANSFERRIN,FERRITIN in the last 72 hours Studies/Results: No results found. Medications:      . allopurinol  150 mg Oral Daily  . aspirin EC  81 mg Oral Daily  . bacitracin   Topical Daily  . calcium carbonate  1 tablet Oral TID AC  . darbepoetin (ARANESP) injection - DIALYSIS  150  mcg Intravenous Q Sat-HD  . ezetimibe-simvastatin  1 tablet Oral QHS  . ferric gluconate (FERRLECIT/NULECIT) IV  62.5 mg Intravenous Q Thu-HD  . glimepiride  2 mg Oral QAC breakfast  . HYDROcodone-acetaminophen  1-2 tablet Oral BID  . insulin aspart  0-15 Units Subcutaneous TID WC  . metoprolol tartrate  37.5 mg Oral BID  . pantoprazole  40 mg Oral Q1200  . paricalcitol  4 mcg Intravenous Q T,Th,Sa-HD  . warfarin  5 mg Oral Once  . warfarin  5 mg Oral ONCE-1800  . Warfarin - Pharmacist Dosing Inpatient   Does not apply q1800    I  have reviewed scheduled and prn medications.  Gen: Comfortable  CVS: RRR, normal S1 and S2 (Aflutter with controlled rate) Resp:diminished bases; otherwise CTA bilaterally, no adventitious B.S.  RUE:AVWU, obese, NT/ND, +BS  Ext: Left stump wrapped with bulky ace dressing  Dialysis Access: RUA AVF +bruit   Dialysis Prescription: 4 1/4hr, EDW 108.5, QB 500, QD AF 1.5, 2K/2.25Ca. RUA AVF 15G needles. Heparin 3400 Units. Zemplar TIW, No epogen. Venofer 100mg  IV Q 2weekly.  Assessment/Plan  1. PVD with ulceration- S/P Left BKA, 07/21/11 [facility]- POD #4. Now on Rehab with PT/OT.  2. ESRD: HD TTS SWAF; No Heparin; K level 4.6 pre-tx; s/p BKA and will need new eDW at time of discharge (previous EDW was 108.5kg); current wt 103.3; UF goal 2L as tol noting current BP; needs f/u.  3. Anemia: hgb 9.4; Aranesp restarted 07/21/11; previously on Venofer 100mg  q2wk; resumed by giving 50mg  qwk; re-evaluate iron stores; follow trend  4. Metabolic Bone disease- phos 4.2, ca 8.8; on Tums, Zemplar; follow ca/phos closely  5. Hypertension- controlled on meds (metoprolol tartrate 37.5 bid) and UF with HD 6. DM/HLD- Vytorin, Amaryl; CBG and SSI; glucose 78-161 today; primary following 7. Nutrition- albumin 2.7; ^ protein renal carb modified diet; supple prn 8. Afib/flutter-on Coumadin with INR 1.54; pharmacy following; Lovenox bridge 9. Gout- no problems on  allopurinol 10. Disposition- per rehab; same oupt HD (TTS) schedule  [name], FNP-C [facility] Pager [phone]  07/25/2011,12:07 PM  LOS: 1 day   Patient seen and examined and agree with assessment and plan as above.  Vinson Moselle  MD [employer] [phone] pgr    [phone] cell 07/25/2011, 3:15 PM

## 2011-07-25 NOTE — Progress Notes (Signed)
ANTICOAGULATION CONSULT NOTE - Initial Consult  Pharmacy Consult for Coumadin Indication:   atrial fibrillation  Allergies  Allergen Reactions  . Ancef (Cefazolin) Rash    Patient Measurements: Height: 6' (182.9 cm) Weight: 227 lb 11.8 oz (103.3 kg) IBW/kg (Calculated) : 77.6    Vital Signs: Temp: 98.2 F (36.8 C) (07/17 0536) Temp src: Oral (07/17 0536) BP: 110/62 mmHg (07/17 0536) Pulse Rate: 78  (07/17 0536)  Labs:  Basename 07/25/11 0900 07/24/11 0530 07/23/11 1242 07/23/11 0605  HGB -- -- 9.4* --  HCT -- -- 29.9* --  PLT -- -- 251 --  APTT -- -- -- --  LABPROT 18.8* 19.2* -- 17.6*  INR 1.54* 1.58* -- 1.42  HEPARINUNFRC -- -- -- --  CREATININE -- -- 8.26* --  CKTOTAL -- -- -- --  CKMB -- -- -- --  TROPONINI -- -- -- --    Estimated Creatinine Clearance: 9.3 ml/min (by C-G formula based on Cr of 8.26).   Medical History: Past Medical History  Diagnosis Date  . Diabetes mellitus   . Gout   . Hypertension   . Hyperlipidemia   . Chronic kidney disease     Started dialysis October 2012  . Arthritis   . Prostate cancer     s/p seed implant  . Cataract     BILATERAL-BEEN REMOVED  . Neuromuscular disorder     DABETIC NEUROPATHY-LOWER EXTREMITY  . Atrial fibrillation     on coumadin  . Aortic stenosis     moderate to severe, not felt to be a surgical candidate  . Anemia     secondary to end stage renal disease  . Coronary artery disease     remote CABG in 1996; last stress test in 2005; last cath 2005  . Diabetic neuropathy   . Edema   . PAD (peripheral artery disease)   . Diabetic retinopathy   . Chronic anticoagulation     on coumadin  . Nephrolithiasis     prior history of percutaneous nephrostomy  . Shortness of breath     Medications:  Prescriptions prior to admission  Medication Sig Dispense Refill  . allopurinol (ZYLOPRIM) 300 MG tablet Take 150 mg by mouth daily.      [name] aspirin EC 81 MG tablet Take 81 mg by mouth daily.      .  calcium carbonate (TUMS - DOSED IN MG ELEMENTAL CALCIUM) 500 MG chewable tablet Chew 1 tablet by mouth 3 (three) times daily before meals. Phosphorous binder      . estazolam (PROSOM) 2 MG tablet Take 1 mg by mouth at bedtime as needed. For sleep      . ezetimibe-simvastatin (VYTORIN) 10-40 MG per tablet Take 1 tablet by mouth at bedtime.  30 tablet  0  . glimepiride (AMARYL) 4 MG tablet Take 2 mg by mouth daily before breakfast.        . HYDROcodone-acetaminophen (VICODIN) 5-500 MG per tablet Take 1 tablet by mouth every 6 (six) hours as needed. For pain      . metoprolol tartrate (LOPRESSOR) 25 MG tablet Take 37.5 mg by mouth 2 (two) times daily. Hold for systolic blood pressure(top number) less than 110 ,and heart rate less than 60, take 1/2 tablet by mouth bid      . Nutritional Supplements (FEEDING SUPPLEMENT, NEPRO CARB STEADY,) LIQD Take 237 mLs by mouth 3 (three) times daily as needed.      . silver sulfADIAZINE (SILVADENE) 1 % cream Apply 1  application topically daily.      . traMADol (ULTRAM) 50 MG tablet Take 50 mg by mouth 2 (two) times daily as needed. pain      . warfarin (COUMADIN) 5 MG tablet Take 2.5-5 mg by mouth daily. Pt takes 2.5mg  Mon, Wed, Fri, Sat and 5mg  Tues, Thurs, Sun       Scheduled:     . allopurinol  150 mg Oral Daily  . aspirin EC  81 mg Oral Daily  . bacitracin   Topical Daily  . calcium carbonate  1 tablet Oral TID AC  . darbepoetin (ARANESP) injection - DIALYSIS  150 mcg Intravenous Q Sat-HD  . ezetimibe-simvastatin  1 tablet Oral QHS  . ferric gluconate (FERRLECIT/NULECIT) IV  62.5 mg Intravenous Q Thu-HD  . glimepiride  2 mg Oral QAC breakfast  . HYDROcodone-acetaminophen  1-2 tablet Oral BID  . insulin aspart  0-15 Units Subcutaneous TID WC  . metoprolol tartrate  37.5 mg Oral BID  . pantoprazole  40 mg Oral Q1200  . paricalcitol  4 mcg Intravenous Q T,Th,Sa-HD  . warfarin  5 mg Oral Once  . warfarin  5 mg Oral ONCE-1800  . Warfarin - Pharmacist  Dosing Inpatient   Does not apply q1800    Assessment: 78 yo male with h/o Afib/flutter on chronic coumadin prior to recent hospitalization on 07/16/11 for ischemic left leg.  His coumadin dose PTA was 2.5mg  every MWFSat and 5mg  every TTSun.  He is s/p Left BKA POD#5.  His coumadin was resumed on 7/13 (MD dosing) with Lovenox 30mg  SQ q24h bridge. Today the INR is subtherapeutic at 1.54.  Currently no bleeding reported.  Goal of Therapy:  INR 2-3   Plan:  1. Repeat coumadin 5mg  PO x 1 tonight 2. F/u AM INR  Lysle Pearl, PharmD, BCPS Pager # [phone] 07/25/2011 10:23 AM

## 2011-07-26 ENCOUNTER — Inpatient Hospital Stay (HOSPITAL_COMMUNITY): Payer: MEDICARE | Admitting: Physical Therapy

## 2011-07-26 ENCOUNTER — Inpatient Hospital Stay (HOSPITAL_COMMUNITY): Payer: MEDICARE | Admitting: Occupational Therapy

## 2011-07-26 ENCOUNTER — Inpatient Hospital Stay (HOSPITAL_COMMUNITY): Payer: MEDICARE

## 2011-07-26 LAB — GLUCOSE, CAPILLARY
Glucose-Capillary: 123 mg/dL — ABNORMAL HIGH (ref 70–99)
Glucose-Capillary: 142 mg/dL — ABNORMAL HIGH (ref 70–99)

## 2011-07-26 LAB — RENAL FUNCTION PANEL
Albumin: 2.8 g/dL — ABNORMAL LOW (ref 3.5–5.2)
CO2: 24 mEq/L (ref 19–32)
Calcium: 9 mg/dL (ref 8.4–10.5)
Chloride: 93 mEq/L — ABNORMAL LOW (ref 96–112)
Creatinine, Ser: 8.54 mg/dL — ABNORMAL HIGH (ref 0.50–1.35)
GFR calc Af Amer: 6 mL/min — ABNORMAL LOW (ref >90)
GFR calc non Af Amer: 5 mL/min — ABNORMAL LOW (ref >90)
Sodium: 133 mEq/L — ABNORMAL LOW (ref 135–145)

## 2011-07-26 LAB — CBC
HCT: 26.7 % — ABNORMAL LOW (ref 39.0–52.0)
MCHC: 31.8 g/dL (ref 30.0–36.0)
MCV: 90.5 fL (ref 78.0–100.0)
Platelets: 291 10*3/uL (ref 150–400)
RDW: 13.7 % (ref 11.5–15.5)
WBC: 9.3 10*3/uL (ref 4.0–10.5)

## 2011-07-26 MED ORDER — WARFARIN SODIUM 5 MG PO TABS
5.0000 mg | ORAL_TABLET | Freq: Once | ORAL | Status: AC
  Administered 2011-07-26: 5 mg via ORAL
  Filled 2011-07-26: qty 1

## 2011-07-26 MED ORDER — PARICALCITOL 5 MCG/ML IV SOLN
INTRAVENOUS | Status: AC
  Administered 2011-07-26: 5 ug
  Filled 2011-07-26: qty 1

## 2011-07-26 NOTE — Progress Notes (Signed)
ANTICOAGULATION CONSULT NOTE - Initial Consult  Pharmacy Consult for Coumadin Indication:   atrial fibrillation  Allergies  Allergen Reactions  . Ancef (Cefazolin) Rash    Patient Measurements: Height: 6' (182.9 cm) Weight: 234 lb 5.6 oz (106.3 kg) IBW/kg (Calculated) : 77.6    Vital Signs: Temp: 97.8 F (36.6 C) (07/18 0519) Temp src: Oral (07/18 0519) BP: 98/60 mmHg (07/18 0519) Pulse Rate: 78  (07/18 0519)  Labs:  Basename 07/26/11 0500 07/25/11 0900 07/24/11 0530 07/23/11 1242  HGB -- -- -- 9.4*  HCT -- -- -- 29.9*  PLT -- -- -- 251  APTT -- -- -- --  LABPROT 22.5* 18.8* 19.2* --  INR 1.94* 1.54* 1.58* --  HEPARINUNFRC -- -- -- --  CREATININE -- -- -- 8.26*  CKTOTAL -- -- -- --  CKMB -- -- -- --  TROPONINI -- -- -- --    Estimated Creatinine Clearance: 9.4 ml/min (by C-G formula based on Cr of 8.26).  Assessment: 78 yo male with h/o Afib/flutter on chronic coumadin prior to recent hospitalization on 07/16/11 for ischemic left leg.  His coumadin dose PTA was 2.5mg  every MWFSat and 5mg  every TTSun.   He is s/p L BKA POD#6.  His coumadin was resumed on 7/13 (MD dosing) with Lovenox 30mg  SQ q24h bridge (stopped 7/16). Today the INR is subtherapeutic at 1.94, but steadily increasing towards goal.  Currently no bleeding reported.  Goal of Therapy:  INR 2-3   Plan:  1. Repeat coumadin 5mg  PO x 1 tonight- Will resume home Coumadin regimen once INR is mid-goal range and stable. 2. F/u AM INR  Jurnie Garritano K. Allena Katz, PharmD, BCPS.  Clinical Pharmacist Pager [phone]. 07/26/2011 9:09 AM

## 2011-07-26 NOTE — Progress Notes (Signed)
Subjective/Complaints: Left leg throbbing. Pain meds usally help. A 12 point review of systems has been performed and if not noted above is otherwise negative.   Objective: Vital Signs: Blood pressure 98/60, pulse 78, temperature 97.8 F (36.6 C), temperature source Oral, resp. rate 20, height 6' (1.829 m), weight 106.3 kg (234 lb 5.6 oz), SpO2 99.00%. No results found.  Basename 07/23/11 1242  WBC 10.0  HGB 9.4*  HCT 29.9*  PLT 251    Basename 07/23/11 1242  NA 133*  K 4.6  CL 93*  CO2 27  GLUCOSE 255*  BUN 44*  CREATININE 8.26*  CALCIUM 8.8   CBG (last 3)   Basename 07/26/11 0713 07/25/11 2139 07/25/11 1627  GLUCAP 123* 180* 90    Wt Readings from Last 3 Encounters:  07/26/11 106.3 kg (234 lb 5.6 oz)  07/24/11 103.3 kg (227 lb 11.8 oz)  07/24/11 103.3 kg (227 lb 11.8 oz)    Physical Exam:   General: Alert and oriented x 3, No apparent distress HEENT: Head is normocephalic, atraumatic, PERRLA, EOMI, sclera anicteric, oral mucosa pink and moist, dentition intact, ext ear canals clear,  Neck: Supple without JVD or lymphadenopathy Heart: IrregReg rate and rhythm. SE murmurs. No  rubs or gallops Chest: CTA bilaterally without wheezes, rales, or rhonchi; no distress Abdomen: Soft, non-tender, non-distended, bowel sounds positive. Extremities: No clubbing, cyanosis, or edema. Pulses are 2+ Skin: BK is intact with staples and small drop of blood on dressing. Some hyperemia and bruising around the wound but area does not appear infected/ is not warm- appropriately tender. Right third toe blister dry and lateral malleolus ulcer clean and dry. Neuro: Pt is cognitively appropriate with normal insight, memory, and awareness. Cranial nerves 2-12 are intact. Sensory exam is normal except for distal extremities. Reflexes are 2+ in all 4's. Fine motor coordination is intact. No tremors. Motor function is grossly 5/5 except for LLE which is 3/5.  Musculoskeletal: Full ROM, No pain  with AROM or PROM in the neck, trunk, or extremities. Posture appropriate Psych: Pt's affect is appropriate. Pt is cooperative    Assessment/Plan: 1. Functional deficits secondary to left BKA which require 3+ hours per day of interdisciplinary therapy in a comprehensive inpatient rehab setting. Physiatrist is providing close team supervision and 24 hour management of active medical problems listed below. Physiatrist and rehab team continue to assess barriers to discharge/monitor patient progress toward functional and medical goals. FIM: FIM - Bathing Bathing Steps Patient Completed: Chest;Right Arm;Left Arm;Abdomen;Front perineal area;Right upper leg;Left upper leg Bathing: 4: Min-Patient completes 8-9 61f 10 parts or 75+ percent  FIM - Upper Body Dressing/Undressing Upper body dressing/undressing steps patient completed: Thread/unthread right sleeve of pullover shirt/dresss;Thread/unthread left sleeve of pullover shirt/dress;Put head through opening of pull over shirt/dress;Pull shirt over trunk Upper body dressing/undressing: 5: Set-up assist to: Obtain clothing/put away FIM - Lower Body Dressing/Undressing Lower body dressing/undressing steps patient completed: Thread/unthread right underwear leg;Thread/unthread left underwear leg;Thread/unthread right pants leg;Thread/unthread left pants leg Lower body dressing/undressing: 3: Mod-Patient completed 50-74% of tasks  FIM - Toileting Toileting steps completed by patient: Performs perineal hygiene Toileting: 2: Max-Patient completed 1 of 3 steps  FIM - Diplomatic Services operational officer Devices: Psychiatrist Transfers: 3-To toilet/BSC: Mod A (lift or lower assist);3-From toilet/BSC: Mod A (lift or lower assist)  FIM - Bed/Chair Transfer Bed/Chair Transfer: 4: Bed > Chair or W/C: Min A (steadying Pt. > 75%);4: Chair or W/C > Bed: Min A (steadying Pt. > 75%)  FIM - Locomotion: Wheelchair Locomotion: Wheelchair: 1:  Travels less than 50 ft with supervision, cueing or coaxing FIM - Locomotion: Ambulation Locomotion: Ambulation: 0: Activity did not occur  Comprehension Comprehension Mode: Auditory Comprehension: 7-Follows complex conversation/direction: With no assist  Expression Expression Mode: Verbal Expression: 7-Expresses complex ideas: With no assist  Social Interaction Social Interaction: 7-Interacts appropriately with others - No medications needed.  Problem Solving Problem Solving: 6-Solves complex problems: With extra time  Memory Memory: 7-Complete Independence: No helper Medical Problem List and Plan:  1. DVT Prophylaxis/Anticoagulation: Pharmaceutical: Coumadin  2. Pain Management: Will schedule pain medication prior to therapy schedule. Discussed pain mgt in the setting of a BKA with this patient 3. Mood: A little irritated about food choices, pain management, etc. Ego support provided. Continue to monitor.  4. Neuropsych: This patient is capable of making decisions on his/her own behalf.  5. ESRD: Nephrology to assist with HD needs. Continue 3 X week. HD today. 7. Acute on chronic anemia: Routine labs with HD. Continue aranesp weekly.  8. Right toe ulcer/woundcare: continue silvadene cream daily. PRAFO for protection when in bed.   -left BKA clean and intact- watch for increase in erythema 9. HTN: monitor with BID checks. Continue lopressor.  10. Dyslipidemia: continue vytorin.  11. DM type 2: Monitor BS with bid checks-seems to be reasonably controlled. Continue Amaryl daily. Use SSI for tighter BS control.  12. Constipation:  suppository prn.  13. A Fib: Continue to check HR on bid basis. Continue lopressor. Continue chronic coumadin.  14. CAD with moderate to severe aortic stenosis: monitor for symptoms with increase in activity levels. Continue lopressor, vytorin and coumadin   LOS (Days) 2 A FACE TO FACE EVALUATION WAS PERFORMED   07/26/2011, 9:19 AM

## 2011-07-26 NOTE — Progress Notes (Signed)
Subjective:  Feeling fine; sitting up on side of bed eating lunch; tolerating rehab and scheduled for HD later today  Vital signs in last 24 hours: Filed Vitals:   07/25/11 0536 07/25/11 1522 07/25/11 2044 07/26/11 0519  BP: 110/62 138/80 136/76 98/60  Pulse: 78  80 78  Temp: 98.2 F (36.8 C) 97.9 F (36.6 C)  97.8 F (36.6 C)  TempSrc: Oral Oral  Oral  Resp: 18 18  20   Height:      Weight:    106.3 kg (234 lb 5.6 oz)  SpO2: 100% 96%  99%   Weight change: 3 kg (6 lb 9.8 oz)  Intake/Output Summary (Last 24 hours) at 07/26/11 1225 Last data filed at 07/26/11 0809  Gross per 24 hour  Intake    840 ml  Output      0 ml  Net    840 ml   Labs: Basic Metabolic Panel:  Lab 07/23/11 5784 07/21/11 0758 07/20/11 1859 07/20/11 0907  NA 133* 131* -- 138  K 4.6 5.1 -- 4.9  CL 93* 92* -- 93*  CO2 27 28 -- 33*  GLUCOSE 255* 207* -- 110*  BUN 44* 42* -- 29*  CREATININE 8.26* 7.61* 6.50* --  CALCIUM 8.8 8.8 -- 9.6  ALB -- -- -- --  PHOS 4.2 -- -- --   Liver Function Tests:  Lab 07/23/11 1242  AST --  ALT --  ALKPHOS --  BILITOT --  PROT --  ALBUMIN 2.7*   No results found for this basename: LIPASE:3,AMYLASE:3 in the last 168 hours No results found for this basename: AMMONIA:3 in the last 168 hours CBC:  Lab 07/23/11 1242 07/21/11 0758 07/20/11 1859 07/20/11 0907  WBC 10.0 10.6* 10.4 --  NEUTROABS -- -- -- --  HGB 9.4* 9.3* 11.5* --  HCT 29.9* 29.5* 36.3* --  MCV 91.4 91.3 92.4 92.9  PLT 251 228 255 --   Cardiac Enzymes: No results found for this basename: CKTOTAL:5,CKMB:5,CKMBINDEX:5,TROPONINI:5 in the last 168 hours CBG:  Lab 07/26/11 1126 07/26/11 0713 07/25/11 2139 07/25/11 1627 07/25/11 1129  GLUCAP 142* 123* 180* 90 161*    Iron Studies: No results found for this basename: IRON,TIBC,TRANSFERRIN,FERRITIN in the last 72 hours Studies/Results: No results found. Medications:      . allopurinol  150 mg Oral Daily  . aspirin EC  81 mg Oral Daily  .  bacitracin   Topical Daily  . calcium carbonate  1 tablet Oral TID AC  . darbepoetin (ARANESP) injection - DIALYSIS  150 mcg Intravenous Q Sat-HD  . ezetimibe-simvastatin  1 tablet Oral QHS  . ferric gluconate (FERRLECIT/NULECIT) IV  62.5 mg Intravenous Q Thu-HD  . glimepiride  2 mg Oral QAC breakfast  . HYDROcodone-acetaminophen  1-2 tablet Oral BID  . insulin aspart  0-15 Units Subcutaneous TID WC  . metoprolol tartrate  37.5 mg Oral BID  . pantoprazole  40 mg Oral Q1200  . paricalcitol  4 mcg Intravenous Q T,Th,Sa-HD  . warfarin  5 mg Oral ONCE-1800  . warfarin  5 mg Oral ONCE-1800  . Warfarin - Pharmacist Dosing Inpatient   Does not apply q1800    I  have reviewed scheduled and prn medications.  Gen: Comfortable  CVS: RRR, normal S1 and S2 (history Aflutter with controlled rate)  Resp:diminished bases; otherwise CTA bilaterally, no adventitious B.S.  ONG:EXBM, obese, NT/ND, +BS  Ext: Left stump wrapped with bulky ace dressing  Dialysis Access: RUA AVF +bruit   Dialysis  Prescription: 4 1/4hr, EDW 108.5, QB 500, QD AF 1.5, 2K/2.25Ca. RUA AVF 15G needles. Heparin 3400 Units. Zemplar TIW, No epogen. Venofer 100mg  IV Q 2weekly.   Assessment/Plan  1. PVD with ulceration- S/P Left BKA, 07/21/11 [facility]- POD #5. Now on Rehab with PT/OT.  2. ESRD: HD TTS SWAF; HD later today; No Heparin; K level 4.6 7/15; s/p BKA and will need new eDW at time of discharge (previous EDW was 108.5kg);  Wt 106.3 prior to HD; UF goal 2L as tol noting current BP; needs f/u.  3. Anemia: hgb 9.4; Aranesp restarted 07/21/11; previously on Venofer 100mg  q2wk; resumed by giving 50mg  qwk; follow trend  4. Metabolic Bone disease- phos 4.2, ca 8.8; on Tums, Zemplar; follow ca/phos closely  5. Hypertension- controlled on meds (metoprolol tartrate 37.5 bid) and UF with HD 6. DM/HLD- Vytorin, Amaryl; CBG and SSI; HgbA1c 6.3%; glucose 142 today; primary following 7. Nutrition- albumin 2.7; ^ protein renal carb  modified diet; supple prn 8. Afib/flutter-on Coumadin with INR 1.94 today; pharmacy following; Lovenox bridge 9. Gout- no problems on allopurinol 10. Disposition- per rehab; same oupt HD (TTS) schedule  [name], FNP-C [facility] Pager [phone]  07/26/2011,12:25 PM  LOS: 2 days   Patient seen and examined and agree with assessment and plan as above.  Vinson Moselle  MD [facility] [phone] pgr    [phone] cell 07/26/2011, 4:29 PM

## 2011-07-26 NOTE — Progress Notes (Signed)
Occupational Therapy Session Note  Patient Details  Name: Alan [name] MRN: [id number] Date of Birth: [birth year]  Today's Date: 07/26/2011 Time: [phone] and [phone] Time Calculation (min): 53 min and 30 minutes  Short Term Goals: Week 1:  OT Short Term Goal 1 (Week 1): Pt will transfer to [facility] with steady assist. OT Short Term Goal 2 (Week 1): Pt will be able to pull pants down for toileting with min assist. OT Short Term Goal 3 (Week 1): Pt will be able to pull pants up with mod assist. OT Short Term Goal 4 (Week 1): Pt will bathe with supervision.  Skilled Therapeutic Interventions/Progress Updates:  Visit 1: Pt seen for ADL retraining of B/D at sink with a focus on dynamic sitting balance to reach to right foot, scooting transfers, and partial sit to stand for clothing management.  Pt was able to tolerate coming into a stand at sink for 30 seconds 2x with max assist with UE support for therapist to assist pt with LB self care.   Visit 2:  Pt worked on toilet transfers with [facility] over toilet. Pt able to scoot to and from toilet with steady assist, but needed total assist with clothing management.  Education with pt's wife on tub bench and demonstrated a transfer for her.  She definitely wants to have a bench.  Explained need for pt to be able to take a few steps with RW if w/c will not fit through bathroom door.       Therapy Documentation Precautions:  Precautions Precautions: Fall Restrictions Weight Bearing Restrictions: Yes LLE Weight Bearing: Non weight bearing   Pain: Pain Assessment Pain Assessment: No/denies pain  See FIM for current functional status  Therapy/Group: Individual Therapy  Fahd Galea 07/26/2011, 9:26 AM

## 2011-07-26 NOTE — Progress Notes (Signed)
Physical Therapy Session Note  Patient Details  Name: Alan [name] MRN: [id number] Date of Birth: 22-Jul-1931  Today's Date: 07/26/2011 Time: 2956-2130 Time Calculation (min): 51 min  Second Treatment:  13:30-14:24 Time: 54 Min  Short Term Goals: Week 1:  PT Short Term Goal 1 (Week 1): Same as LTGs  Skilled Therapeutic Interventions/Progress Updates:   See below for details, focused am session on UE and trunk strengthening activities.   Therapy Documentation Precautions:  Precautions Precautions: Fall Restrictions Weight Bearing Restrictions: Yes LLE Weight Bearing: Non weight bearing Pain: Pain Assessment Pain Assessment: No/denies pain Mobility:  Scoot-pivot transfer w/c to mat with min@, mat to w/c with mod@ due to UE fatigue.  Sit to stand after 4 attempts with total @+2, pt =60%, due to UE fatigue. Second Treatment:  Sit to stand in parallel bars with min to mod@.  Pt able to stand for 30-60 sec. Before needing to sit due to pain. Focused squat-pivot transfer training, teaching pt how to lift bottom up high enough to pivot and not scoot to perform transfer. Locomotion :  W/c propulsion on the unit x 180' with close supervision, pt having some difficulty with steering w/c.  Second treatment:  W/c propulsion on unit 180' with supervision. Balance:  Weighted wand and theraband exercises to address dynamic sitting balance. Exercises:  Same exercises as above for UE strengthening. Second Treatment:  BKA exercises in supine, hip AB/ADDuction, hip and knee flexion, and quad sets, 15 reps of each.   See FIM for current functional status  Therapy/Group: Individual Therapy  Georges Mouse 07/26/2011, 11:07 AM

## 2011-07-27 ENCOUNTER — Inpatient Hospital Stay (HOSPITAL_COMMUNITY): Payer: MEDICARE | Admitting: Occupational Therapy

## 2011-07-27 ENCOUNTER — Inpatient Hospital Stay (HOSPITAL_COMMUNITY): Payer: MEDICARE

## 2011-07-27 ENCOUNTER — Inpatient Hospital Stay (HOSPITAL_COMMUNITY): Payer: MEDICARE | Admitting: Physical Therapy

## 2011-07-27 DIAGNOSIS — Z5189 Encounter for other specified aftercare: Secondary | ICD-10-CM

## 2011-07-27 DIAGNOSIS — I70269 Atherosclerosis of native arteries of extremities with gangrene, unspecified extremity: Secondary | ICD-10-CM

## 2011-07-27 DIAGNOSIS — S78119A Complete traumatic amputation at level between unspecified hip and knee, initial encounter: Secondary | ICD-10-CM

## 2011-07-27 LAB — GLUCOSE, CAPILLARY: Glucose-Capillary: 132 mg/dL — ABNORMAL HIGH (ref 70–99)

## 2011-07-27 MED ORDER — WARFARIN SODIUM 5 MG PO TABS
5.0000 mg | ORAL_TABLET | Freq: Once | ORAL | Status: AC
  Administered 2011-07-27: 5 mg via ORAL
  Filled 2011-07-27: qty 1

## 2011-07-27 MED ORDER — HEPARIN SODIUM (PORCINE) 1000 UNIT/ML DIALYSIS
20.0000 [IU]/kg | INTRAMUSCULAR | Status: DC | PRN
  Administered 2011-07-29: 2100 [IU] via INTRAVENOUS_CENTRAL
  Filled 2011-07-27: qty 3

## 2011-07-27 NOTE — Progress Notes (Addendum)
Physical Therapy Session Note  Patient Details  Name: Alan [name] MRN: [id number] Date of Birth: November 22, 1931  Today's Date: 07/27/2011 Time:  - 0 min    Short Term Goals: Week 1:  PT Short Term Goal 1 (Week 1): Same as LTGs  Skilled Therapeutic Interventions/Progress Updates:  Pt declined therapy, including bedside, PROM residual limb, due to exhaustion, feeling ill due to HD yesterday.  BP higher than in AM, but pt still feeling ill.  Discussed menu, ordering from cafeteria, due to questions pt and wife had about meal service.  Informed RN and Nurse Tech that pt declined therapy.  Discussed weekend schedule with staff to ensure that majority of therapy was on Sat, before HD.       Therapy Documentation Precautions:  Precautions Precautions: Fall Restrictions Weight Bearing Restrictions: Yes LLE Weight Bearing: Non weight bearing General: Amount of Missed PT Time (min): 60 Minutes Missed Time Reason: Patient fatigue;Patient ill (comment) (pt declined any therapy due to feeling ill, fatigued) Vital Signs: Therapy Vitals Pulse Rate: 82  BP: 116/71 mmHg Patient Position, if appropriate: Lying         See FIM for current functional status  Therapy/Group: Individual Therapy  Alan [name] 07/27/2011, 1:57 PM

## 2011-07-27 NOTE — Care Management (Signed)
Per State Regulation 482.30 This chart was reviewed for medical necessity with respect to the patient's Admission/Duration of stay.  Deshanna Kama S                 Nurse Care Manager              Next Review Date: 07/30/11   

## 2011-07-27 NOTE — Progress Notes (Signed)
ANTICOAGULATION CONSULT NOTE - Follow Up Consult  Pharmacy Consult for Coumadin Indication:   atrial fibrillation  Allergies  Allergen Reactions  . Ancef (Cefazolin) Rash    Patient Measurements: Height: 6' (182.9 cm) Weight: 229 lb 15 oz (104.3 kg) IBW/kg (Calculated) : 77.6    Vital Signs: Temp: 98 F (36.7 C) (07/19 0501) Temp src: Oral (07/19 0501) BP: 107/58 mmHg (07/19 1136) Pulse Rate: 79  (07/19 1136)  Labs:  Basename 07/27/11 0720 07/26/11 1458 07/26/11 0500 07/25/11 0900  HGB -- 8.5* -- --  HCT -- 26.7* -- --  PLT -- 291 -- --  APTT -- -- -- --  LABPROT 24.0* -- 22.5* 18.8*  INR 2.11* -- 1.94* 1.54*  HEPARINUNFRC -- -- -- --  CREATININE -- 8.54* -- --  CKTOTAL -- -- -- --  CKMB -- -- -- --  TROPONINI -- -- -- --    Estimated Creatinine Clearance: 9 ml/min (by C-G formula based on Cr of 8.54).  Assessment: 78 yo male with h/o Afib/flutter on chronic coumadin prior to recent hospitalization on 07/16/11 for ischemic left leg.  His coumadin dose PTA was 2.5mg  every MWFSat and 5mg  every TTSun.   He is s/p L BKA POD#7.  His coumadin was resumed on 7/13 (MD dosing) with Lovenox 30mg  SQ q24h bridge (stopped 7/16). Today the INR is 2.11.  Currently no bleeding reported.  Goal of Therapy:  INR 2-3   Plan:  1. Repeat coumadin 5mg  PO x 1 tonight- Will resume home Coumadin regimen once INR is mid-goal range and stable. 2. F/u AM INR  Talbert Cage, PharmD.  Clinical Pharmacist Pager [phone] 07/27/2011 12:38 PM

## 2011-07-27 NOTE — Progress Notes (Signed)
Occupational Therapy Session Note  Patient Details  Name: Alan [name] MRN: [id number] Date of Birth: November 02, 1931  Today's Date: 07/27/2011 Time: 0805-0859 Time Calculation (min): 54 min  Short Term Goals: Week 1:  OT Short Term Goal 1 (Week 1): Pt will transfer to [facility] with steady assist. OT Short Term Goal 2 (Week 1): Pt will be able to pull pants down for toileting with min assist. OT Short Term Goal 3 (Week 1): Pt will be able to pull pants up with mod assist. OT Short Term Goal 4 (Week 1): Pt will bathe with supervision.  Skilled Therapeutic Interventions/Progress Updates:    Pt scheduled for B/D this am, but he stated that he was feeling "swimmy headed" since completing therapies yesterday.  He also had dialysis last night. Hi blood pressure was 96/58 in supine and 96/54 in sitting.  He did need to toilet, transferred to w/c and then to toilet (with BSC over toilet)  with assist to steady w/c.  Assist to manage clothing. Pt did well with bed mobility with supervision.  Encouraged pt to sleep for 45 min prior to his next therapy session.  Therapy Documentation Precautions:  Precautions Precautions: Fall Restrictions Weight Bearing Restrictions: Yes LLE Weight Bearing: Non weight bearing   Vital Signs: Therapy Vitals Pulse Rate: 81  Resp: 18  BP: 96/58 mmHg Patient Position, if appropriate: Lying Oxygen Therapy SpO2: 98 % O2 Device: None (Room air) Pain: Pain Assessment Pain Assessment: 0-10 Pain Score:   5 Pain Type: Surgical pain Pain Location: Leg Pain Orientation: Left Pain Descriptors: Throbbing Pain Onset: Gradual Patients Stated Pain Goal: 2 Pain Intervention(s): RN made aware  See FIM for current functional status  Therapy/Group: Individual Therapy  Lachandra Dettmann 07/27/2011, 9:28 AM

## 2011-07-27 NOTE — Progress Notes (Signed)
Subjective/Complaints: Tired this morning-was in dialysis form 2:30 to 8 pm. Back pain from last night Dizzy with am therapies.   A 12 point review of systems has been performed and if not noted above is otherwise negative.   Objective: Vital Signs: Blood pressure 96/58, pulse 81, temperature 98 F (36.7 C), temperature source Oral, resp. rate 18, height 6' (1.829 m), weight 104.3 kg (229 lb 15 oz), SpO2 98.00%. No results found.  Basename 07/26/11 1458  WBC 9.3  HGB 8.5*  HCT 26.7*  PLT 291    Basename 07/26/11 1458  NA 133*  K 4.0  CL 93*  CO2 24  GLUCOSE 165*  BUN 45*  CREATININE 8.54*  CALCIUM 9.0   CBG (last 3)   Basename 07/27/11 0718 07/26/11 2017 07/26/11 1126  GLUCAP 143* 136* 142*    Wt Readings from Last 3 Encounters:  07/27/11 104.3 kg (229 lb 15 oz)  07/24/11 103.3 kg (227 lb 11.8 oz)  07/24/11 103.3 kg (227 lb 11.8 oz)    Physical Exam:   General:   oriented x 3, No apparent distress--appears fatigued HEENT: Head is normocephalic, atraumatic, PERRLA, EOMI, sclera anicteric, oral mucosa pink and moist, dentition intact, ext ear canals clear,  Neck: Supple without JVD or lymphadenopathy Heart: IrregReg rate and rhythm. SE murmurs. No  rubs or gallops Chest: CTA bilaterally without wheezes, rales, or rhonchi; no distress Abdomen: Soft, non-tender, non-distended, bowel sounds positive. Extremities: No clubbing, cyanosis, or edema. Pulses are 2+ Skin: BK is intact with staples and small drop of blood on dressing. Some hyperemia and bruising around the wound but area does not appear infected/ is not warm- appropriately tender. Right third toe blister dry and lateral malleolus ulcer clean and dry. Neuro: Pt is cognitively appropriate with normal insight, memory, and awareness. Cranial nerves 2-12 are intact. Sensory exam is normal except for distal extremities. Reflexes are 2+ in all 4's. Fine motor coordination is intact. No tremors. Motor function is  grossly 5/5 except for LLE which is 3/5.  Musculoskeletal: Full ROM, No pain with AROM or PROM in the neck, trunk, or extremities. Posture appropriate Psych: Pt's affect is appropriate. Pt is cooperative    Assessment/Plan: 1. Functional deficits secondary to left BKA which require 3+ hours per day of interdisciplinary therapy in a comprehensive inpatient rehab setting. Physiatrist is providing close team supervision and 24 hour management of active medical problems listed below. Physiatrist and rehab team continue to assess barriers to discharge/monitor patient progress toward functional and medical goals. FIM: FIM - Bathing Bathing Steps Patient Completed: Chest;Right Arm;Left Arm;Abdomen;Front perineal area;Right upper leg;Left upper leg;Right lower leg (including foot) Bathing: 4: Min-Patient completes 8-9 70f 10 parts or 75+ percent  FIM - Upper Body Dressing/Undressing Upper body dressing/undressing steps patient completed: Thread/unthread right sleeve of pullover shirt/dresss;Thread/unthread left sleeve of pullover shirt/dress;Put head through opening of pull over shirt/dress;Pull shirt over trunk Upper body dressing/undressing: 5: Set-up assist to: Obtain clothing/put away FIM - Lower Body Dressing/Undressing Lower body dressing/undressing steps patient completed: Thread/unthread right underwear leg;Thread/unthread left underwear leg;Thread/unthread right pants leg;Thread/unthread left pants leg Lower body dressing/undressing: 3: Mod-Patient completed 50-74% of tasks  FIM - Toileting Toileting steps completed by patient: Performs perineal hygiene Toileting: 0: Activity did not occur  FIM - Diplomatic Services operational officer Devices: Psychiatrist Transfers: 0-Activity did not occur  FIM - Games developer Transfer: 5: Supine > Sit: Supervision (verbal cues/safety issues);5: Sit > Supine: Supervision (verbal cues/safety issues);4: Bed >  Chair or W/C:  Min A (steadying Pt. > 75%);4: Chair or W/C > Bed: Min A (steadying Pt. > 75%)  FIM - Locomotion: Wheelchair Locomotion: Wheelchair: 5: Travels 150 ft or more: maneuvers on rugs and over door sills with supervision, cueing or coaxing FIM - Locomotion: Ambulation Locomotion: Ambulation: 0: Activity did not occur  Comprehension Comprehension Mode: Auditory Comprehension: 7-Follows complex conversation/direction: With no assist  Expression Expression Mode: Verbal Expression: 7-Expresses complex ideas: With no assist  Social Interaction Social Interaction: 7-Interacts appropriately with others - No medications needed.  Problem Solving Problem Solving: 6-Solves complex problems: With extra time  Memory Memory: 7-Complete Independence: No helper Medical Problem List and Plan:  1. DVT Prophylaxis/Anticoagulation: Pharmaceutical: Coumadin  2. Pain Management: Will schedule pain medication prior to therapy schedule. Discussed pain mgt in the setting of a BKA with this patient 3. Mood:  Ego support provided. Continue to monitor.  4. Neuropsych: This patient is capable of making decisions on his/her own behalf.  5. ESRD: Nephrology to assist with HD needs. Continue 3 X week.  7. Acute on chronic anemia: Routine labs with HD. Continue aranesp weekly. Drop in H/H to 8.5. Monitor for  further symptoms--may need transfusion.    8. Right toe ulcer/woundcare: continue silvadene cream daily. PRAFO for protection when in bed.   -left BKA clean and intact- watch for increase in erythema 9. HTN: monitor with BID checks. Continue lopressor-hold today due to low blood pressure.  10. Dyslipidemia: continue vytorin.  11. DM type 2: Monitor BS with bid checks-seems to be reasonably controlled at present. Continue Amaryl daily. SSI 12. Constipation:  suppository prn.  13. A Fib: Continue to check HR on bid basis. Continue lopressor. Continue chronic coumadin.  14. CAD with moderate to severe aortic  stenosis: monitor for symptoms with increase in activity levels. Continue lopressor, vytorin and coumadin   LOS (Days) 3 A FACE TO FACE EVALUATION WAS PERFORMED   07/27/2011, 9:24 AM

## 2011-07-28 ENCOUNTER — Inpatient Hospital Stay (HOSPITAL_COMMUNITY): Payer: MEDICARE | Admitting: Physical Therapy

## 2011-07-28 ENCOUNTER — Inpatient Hospital Stay (HOSPITAL_COMMUNITY): Payer: MEDICARE | Admitting: *Deleted

## 2011-07-28 DIAGNOSIS — Z5189 Encounter for other specified aftercare: Secondary | ICD-10-CM

## 2011-07-28 DIAGNOSIS — I70269 Atherosclerosis of native arteries of extremities with gangrene, unspecified extremity: Secondary | ICD-10-CM

## 2011-07-28 DIAGNOSIS — S78119A Complete traumatic amputation at level between unspecified hip and knee, initial encounter: Secondary | ICD-10-CM

## 2011-07-28 LAB — GLUCOSE, CAPILLARY: Glucose-Capillary: 100 mg/dL — ABNORMAL HIGH (ref 70–99)

## 2011-07-28 LAB — PROTIME-INR
INR: 2.59 — ABNORMAL HIGH (ref 0.00–1.49)
Prothrombin Time: 28.2 seconds — ABNORMAL HIGH (ref 11.6–15.2)

## 2011-07-28 MED ORDER — HYDROCORTISONE ACETATE 25 MG RE SUPP
25.0000 mg | Freq: Two times a day (BID) | RECTAL | Status: DC
  Administered 2011-07-28 – 2011-08-09 (×16): 25 mg via RECTAL
  Filled 2011-07-28 (×26): qty 1

## 2011-07-28 MED ORDER — POLYETHYLENE GLYCOL 3350 17 G PO PACK
17.0000 g | PACK | Freq: Every day | ORAL | Status: DC
  Administered 2011-07-28 – 2011-07-31 (×4): 17 g via ORAL
  Filled 2011-07-28 (×5): qty 1

## 2011-07-28 MED ORDER — WARFARIN SODIUM 4 MG PO TABS
4.0000 mg | ORAL_TABLET | Freq: Once | ORAL | Status: AC
  Administered 2011-07-28: 4 mg via ORAL
  Filled 2011-07-28: qty 1

## 2011-07-28 NOTE — Progress Notes (Signed)
ANTICOAGULATION CONSULT NOTE - Follow Up Consult  Pharmacy Consult for Coumadin Indication:   atrial fibrillation  Allergies  Allergen Reactions  . Ancef (Cefazolin) Rash    Patient Measurements: Height: 6' (182.9 cm) Weight: 232 lb 5.8 oz (105.4 kg) IBW/kg (Calculated) : 77.6    Vital Signs: Temp: 98.1 F (36.7 C) (07/20 0545) Temp src: Oral (07/20 0545) BP: 102/63 mmHg (07/20 0545) Pulse Rate: 82  (07/20 0545)  Labs:  Alvira Philips 07/28/11 0705 07/27/11 0720 07/26/11 1458 07/26/11 0500  HGB -- -- 8.5* --  HCT -- -- 26.7* --  PLT -- -- 291 --  APTT -- -- -- --  LABPROT 28.2* 24.0* -- 22.5*  INR 2.59* 2.11* -- 1.94*  HEPARINUNFRC -- -- -- --  CREATININE -- -- 8.54* --  CKTOTAL -- -- -- --  CKMB -- -- -- --  TROPONINI -- -- -- --    Estimated Creatinine Clearance: 9.1 ml/min (by C-G formula based on Cr of 8.54).  Assessment: 78 yo male with h/o Afib/flutter on chronic coumadin prior to recent hospitalization on 07/16/11 for ischemic left leg.  His coumadin dose PTA was 2.5mg  every MWFSat and 5mg  every TTSun.   He is s/p L BKA POD#8.  His coumadin was resumed on 7/13 (MD dosing) with Lovenox 30mg  SQ q24h bridge (stopped 7/16). Today the INR is therapeutic s/p steady increase.  Currently no bleeding reported.  Goal of Therapy:  INR 2-3   Plan:  1. Coumadin 4mg  PO x 1 today- will attempt to make this his maintenance dose if INR appropriate. 2. F/u AM INR  Julisa Flippo K. Allena Katz, PharmD, BCPS.  Clinical Pharmacist Pager [phone]. 07/28/2011 1:59 PM

## 2011-07-28 NOTE — Progress Notes (Signed)
Alan [name] is a 78 y.o. male 08-11-1931 [id number]  Subjective: No complaints. No new problems. Feeling well.  Objective: Vital signs in last 24 hours: Temp:  [97.8 F (36.6 C)-98.1 F (36.7 C)] 98.1 F (36.7 C) (07/20 0545) Pulse Rate:  [79-82] 82  (07/20 0545) Resp:  [18] 18  (07/20 0545) BP: (102-116)/(58-71) 102/63 mmHg (07/20 0545) SpO2:  [96 %-97 %] 96 % (07/20 0545) Weight:  [105.4 kg (232 lb 5.8 oz)] 105.4 kg (232 lb 5.8 oz) (07/20 0545) Weight change: -1.1 kg (-2 lb 6.8 oz) Last BM Date: 07/26/11  Intake/Output from previous day: 07/19 0701 - 07/20 0700 In: 480 [P.O.:480] Out: -  Last cbgs: CBG (last 3)   Basename 07/28/11 0748 07/27/11 2113 07/27/11 1637  GLUCAP 132* 120* 132*     Physical Exam General: No apparent distress    Lungs: Normal effort. Lungs clear to auscultation, no crackles or wheezes. Cardiovascular: Regular rate and rhythm, no edema Musculoskeletal: L BKA, otherwise neurovascularly intact Neurological: No new neurological deficits Wounds: Clean, dry, intact. No signs of infection.  Lab Results: BMET    Component Value Date/Time   NA 133* 07/26/2011 1458   K 4.0 07/26/2011 1458   CL 93* 07/26/2011 1458   CO2 24 07/26/2011 1458   GLUCOSE 165* 07/26/2011 1458   BUN 45* 07/26/2011 1458   CREATININE 8.54* 07/26/2011 1458   CALCIUM 9.0 07/26/2011 1458   CALCIUM 8.8 10/27/2010 0734   GFRNONAA 5* 07/26/2011 1458   GFRAA 6* 07/26/2011 1458   CBC    Component Value Date/Time   WBC 9.3 07/26/2011 1458   RBC 2.95* 07/26/2011 1458   HGB 8.5* 07/26/2011 1458   HCT 26.7* 07/26/2011 1458   PLT 291 07/26/2011 1458   MCV 90.5 07/26/2011 1458   MCH 28.8 07/26/2011 1458   MCHC 31.8 07/26/2011 1458   RDW 13.7 07/26/2011 1458   LYMPHSABS 0.8 05/18/2011 1547   MONOABS 0.2 05/18/2011 1547   EOSABS 0.0 05/18/2011 1547   BASOSABS 0.0 05/18/2011 1547    Studies/Results: No results found.  Medications: I have reviewed the patient's current  medications.  Assessment/Plan: 1. Functional deficits secondary to left BKA which require 3+ hours per day of interdisciplinary therapy in a comprehensive inpatient rehab setting.  Physiatrist is providing close team supervision and 24 hour management of active medical problems listed below.  Physiatrist and rehab team continue to assess barriers to discharge/monitor patient progress toward functional and medical goals.  Medical Problem List and Plan:  1. DVT Prophylaxis/Anticoagulation: Pharmaceutical: Coumadin  2. Pain Management: Will schedule pain medication prior to therapy schedule. Discussed pain mgt in the setting of a BKA with this patient  3. Mood: Ego support provided. Continue to monitor.  4. Neuropsych: This patient is capable of making decisions on his/her own behalf.  5. ESRD: Nephrology to assist with HD needs. Continue 3 X week.  7. Acute on chronic anemia: Routine labs with HD. Continue aranesp weekly. Drop in H/H to 8.5. Monitor for further symptoms--may need transfusion.  8. Right toe ulcer/wound care: continue silvadene cream daily. PRAFO for protection when in bed.  -left BKA clean and intact- watch for increase in erythema  9. HTN: monitor with BID checks. Continue lopressor. 10. Dyslipidemia: continue vytorin.  11. DM type 2: Monitor BS with bid checks-seems to be reasonably controlled at present. Continue Amaryl daily. SSI  12. Constipation: suppository prn.  13. A Fib: Continue to check HR on bid basis. Continue lopressor. Continue chronic  coumadin.  14. CAD with moderate to severe aortic stenosis: monitor for symptoms with increase in activity levels. Continue lopressor, vytorin and coumadin     Length of stay, days: 4  IRETON,SUSAN C , PA-C 07/28/2011, 8:27 AM  I have examined patient and agree with above.  Rene Paci, MD 07/28/2011, 8:27 AM

## 2011-07-28 NOTE — Progress Notes (Signed)
Occupational Therapy Session Note  Patient Details  Name: Alan [name] MRN: [id number] Date of Birth: [birth year]  Today's Date: 07/28/2011 Time:  - 1100-1200 ( )  1st session   Pain:  Left leg 6/10    Time:  1445-1545  (60 min)  2nd session Pain:  Left leg   (5/10)  Short Term Goals: Week 1:  OT Short Term Goal 1 (Week 1): Pt will transfer to [facility] with steady assist. OT Short Term Goal 2 (Week 1): Pt will be able to pull pants down for toileting with min assist. OT Short Term Goal 3 (Week 1): Pt will be able to pull pants up with mod assist. OT Short Term Goal 4 (Week 1): Pt will bathe with supervision.  Skilled Therapeutic Interventions/Progress Updates:    1st session:  Pt. Engaged in wc mobility, UE therapeutic exercises.  Pt. Propelled wc from room to gym with moderate assist.  Utilized ergometer for 10 min. At 3 wkload with rpm aroung 35.  Pt. Performed wc push ups x3.  Propelled wc back to room.       2nd session:    Engaged in transfer training at toilet and bed levels.  Pt. Was min assist to propel wc to toilet and moderate assist for scooting transfer with multiple cues to lift using strong leg and BUE.  Pt.  Educated on dropping the arms down on wc and toilet seat to ease over to the toilet or bed.  Wife ([name]) present and educated on procedures.   Therapy Documentation Precautions:  Precautions Precautions: Fall Restrictions Weight Bearing Restrictions: Yes LLE Weight Bearing: Non weight bearing   Therapy/Group: Individual Therapy  Humberto Seals 07/28/2011, 11:47 AM

## 2011-07-28 NOTE — Progress Notes (Signed)
[facility] Progress Note  Subjective:  C/o hemorrhoids; uses suppositories at times at home.  Doesn't want to take a laxative until after dialysis.  Objective Filed Vitals:   07/27/11 1136 07/27/11 1356 07/27/11 1549 07/28/11 0545  BP: 107/58 116/71 112/63 102/63  Pulse: 79 82 80 82  Temp:   97.8 F (36.6 C) 98.1 F (36.7 C)  TempSrc:   Oral Oral  Resp:   18 18  Height:      Weight:    105.4 kg (232 lb 5.8 oz)  SpO2:   97% 96%   Physical Exam General: NAD supine in bed Heart: RRR II/VI murmur Lungs: no wheezes or rales Abdomen:obese soft Extremities: left BKA no right LE edema Dialysis Access: right upper AVF  Dialysis Prescription: 4 1/4hr, EDW 108.5, QB 500, QD AF 1.5, 2K/2.25Ca. RUA AVF 15G needles. Heparin 3400 Units. Zemplar TIW, No epogen. Venofer 100mg  IV Q 2weekly.   Assessment/Plan  1. PVD with ulceration- S/P Left BKA, 07/21/11 [place]- POD #7. Now on Rehab with PT/OT.  2. ESRD: HD TTS SWAF; Will need new EDW at time of discharge; check labs pre HD  3. Anemia: hgb 8.4 last; Aranesp restarted 07/21/11; previously on Venofer 100mg  q2wk; resumed by giving 50mg  qwk; repeat CBC tolday 4. Metabolic Bone disease- controlled on CaCO3, Zemplar; follow ca/phos closely  5. Hypertension- controlled on meds (metoprolol tartrate 37.5 bid) and UF with HD 6. DM/HLD- Vytorin, Amaryl; CBG and SSI; HgbA1c 6.3%;  primary following 7. Nutrition- albumin low; ^ protein renal carb modified diet; supple prn 8. Afib/flutter-on Coumadin with INR therapeutic; pharmacy following 9. Hemorrhoids - start anusol HC - daily miralax 10. Gout- no problems on allopurinol 11. Disposition- per rehab; same oupt HD (TTS) schedule  Sheffield Slider, PA-C [facility] Beeper [phone]  07/28/2011,1:36 PM  LOS: 4 days   Patient seen and examined and agree with assessment and plan as above.  Vinson Moselle  MD [facility] [phone] pgr    [phone] cell 07/28/2011, 5:48 PM    Additional Objective Labs: Basic Metabolic Panel:  Lab 07/26/11 2725 07/23/11 1242  NA 133* 133*  K 4.0 4.6  CL 93* 93*  CO2 24 27  GLUCOSE 165* 255*  BUN 45* 44*  CREATININE 8.54* 8.26*  CALCIUM 9.0 8.8  ALB -- --  PHOS 3.9 4.2   Liver Function Tests:  Lab 07/26/11 1458 07/23/11 1242  AST -- --  ALT -- --  ALKPHOS -- --  BILITOT -- --  PROT -- --  ALBUMIN 2.8* 2.7*   Lab Results  Component Value Date   INR 2.59* 07/28/2011   INR 2.11* 07/27/2011   INR 1.94* 07/26/2011    CBC:  Lab 07/26/11 1458 07/23/11 1242  WBC 9.3 10.0  NEUTROABS -- --  HGB 8.5* 9.4*  HCT 26.7* 29.9*  MCV 90.5 91.4  PLT 291 251   BCBG:  Lab 07/28/11 1129 07/28/11 0748 07/27/11 2113 07/27/11 1637 07/27/11 1136  GLUCAP 100* 132* 120* 132* 141*  Medications:      . allopurinol  150 mg Oral Daily  . aspirin EC  81 mg Oral Daily  . bacitracin   Topical Daily  . calcium carbonate  1 tablet Oral TID AC  . darbepoetin (ARANESP) injection - DIALYSIS  150 mcg Intravenous Q Sat-HD  . ezetimibe-simvastatin  1 tablet Oral QHS  . ferric gluconate (FERRLECIT/NULECIT) IV  62.5 mg Intravenous Q Thu-HD  . glimepiride  2 mg Oral QAC breakfast  . HYDROcodone-acetaminophen  1-2 tablet Oral BID  . insulin aspart  0-15 Units Subcutaneous TID WC  . metoprolol tartrate  37.5 mg Oral BID  . pantoprazole  40 mg Oral Q1200  . paricalcitol  4 mcg Intravenous Q T,Th,Sa-HD  . warfarin  5 mg Oral ONCE-1800  . Warfarin - Pharmacist Dosing Inpatient   Does not apply [phone]

## 2011-07-28 NOTE — Progress Notes (Signed)
Physical Therapy Session Note  Patient Details  Name: Alan [name] MRN: [id number] Date of Birth: [birth year]  Today's Date: 07/28/2011 Time: 0948-1000 1000-1100 Time Calculation (min): 60 min +10 min  Skilled Therapeutic Interventions/Progress Updates:  W/C mobility for UE strengthening and endurance. LE strengthening exercise Group: Supine and side lying, 20-30 reps, AROM, cues for technique. Transfer training: scoot-pivot and bed mobility, multiple reps, cues for sequence and technique.  Therapy Documentation Precautions:  Precautions Precautions: Fall Restrictions Weight Bearing Restrictions: Yes LLE Weight Bearing: Non weight bearing   Vital Signs: Therapy Vitals Temp: 97.6 F (36.4 C) Temp src: Oral Pulse Rate: 82  Resp: 16  BP: 141/84 mmHg Patient Position, if appropriate: Lying Oxygen Therapy SpO2: 98 % O2 Device: None (Room air) Pain: Pain Assessment Pain Score: 7/10 residual limb and back, sharp and throbbing, pre-med.  See FIM for current functional status  Therapy/Group: Individual Therapy   Hortencia Conradi, PTA 07/28/2011, 3:49 PM

## 2011-07-29 ENCOUNTER — Inpatient Hospital Stay (HOSPITAL_COMMUNITY): Payer: MEDICARE

## 2011-07-29 ENCOUNTER — Inpatient Hospital Stay (HOSPITAL_COMMUNITY): Payer: MEDICARE | Admitting: *Deleted

## 2011-07-29 LAB — PROTIME-INR: Prothrombin Time: 35.7 seconds — ABNORMAL HIGH (ref 11.6–15.2)

## 2011-07-29 LAB — GLUCOSE, CAPILLARY

## 2011-07-29 MED ORDER — DARBEPOETIN ALFA-POLYSORBATE 150 MCG/0.3ML IJ SOLN
INTRAMUSCULAR | Status: AC
  Filled 2011-07-29: qty 0.3

## 2011-07-29 MED ORDER — PARICALCITOL 5 MCG/ML IV SOLN
INTRAVENOUS | Status: AC
  Filled 2011-07-29: qty 1

## 2011-07-29 NOTE — Progress Notes (Signed)
Occupational Therapy Note  Patient Details  Name: Alan [name] MRN: [id number] Date of Birth: 08-May-1931 Today's Date: 07/29/2011 Time:  14:20-1500  40 min Pain:   None, dizzy  Engaged in stump care, rewrapping and education.  Pt.  Tolerated well.  Pt. Concerned about right foot stating, " this is how my other foot started out."   Encourage pt to exercise foot and ankle to help with blood flow.   Also encouraged pt to elevate foot.  Wife, Alan [name], present.      Humberto Seals 07/29/2011, 3:09 PM

## 2011-07-29 NOTE — Progress Notes (Signed)
Alan [name] is a 78 y.o. male [birth year] [id number]  Subjective: frustrated with HD schedule - finished at 3am this AM.  Objective: Vital signs in last 24 hours: Temp:  [97.6 F (36.4 C)-98 F (36.7 C)] 98 F (36.7 C) (07/21 0648) Pulse Rate:  [70-82] 81  (07/21 0648) Resp:  [16-26] 18  (07/21 0648) BP: (96-141)/(46-84) 99/46 mmHg (07/21 0648) SpO2:  [95 %-98 %] 98 % (07/21 0648) Weight:  [104.3 kg (229 lb 15 oz)-107.3 kg (236 lb 8.9 oz)] 104.3 kg (229 lb 15 oz) (07/21 4098) Weight change: 1.9 kg (4 lb 3 oz) Last BM Date: 07/26/11  Intake/Output from previous day: 07/20 0701 - 07/21 0700 In: 770 [P.O.:770] Out: 3000  Last cbgs: CBG (last 3)   Basename 07/29/11 0738 07/28/11 2137 07/28/11 1637  GLUCAP 112* 130* 143*     Physical Exam General: No apparent distress    Lungs: Normal effort. Lungs clear to auscultation, no crackles or wheezes. Cardiovascular: Regular rate and rhythm, no edema Musculoskeletal: L BKA, otherwise neurovascularly intact Neurological: No new neurological deficits Wounds: Clean, dry, intact. No signs of infection.  Lab Results: BMET    Component Value Date/Time   NA 133* 07/26/2011 1458   K 4.0 07/26/2011 1458   CL 93* 07/26/2011 1458   CO2 24 07/26/2011 1458   GLUCOSE 165* 07/26/2011 1458   BUN 45* 07/26/2011 1458   CREATININE 8.54* 07/26/2011 1458   CALCIUM 9.0 07/26/2011 1458   CALCIUM 8.8 10/27/2010 0734   GFRNONAA 5* 07/26/2011 1458   GFRAA 6* 07/26/2011 1458   CBC    Component Value Date/Time   WBC 9.3 07/26/2011 1458   RBC 2.95* 07/26/2011 1458   HGB 8.5* 07/26/2011 1458   HCT 26.7* 07/26/2011 1458   PLT 291 07/26/2011 1458   MCV 90.5 07/26/2011 1458   MCH 28.8 07/26/2011 1458   MCHC 31.8 07/26/2011 1458   RDW 13.7 07/26/2011 1458   LYMPHSABS 0.8 05/18/2011 1547   MONOABS 0.2 05/18/2011 1547   EOSABS 0.0 05/18/2011 1547   BASOSABS 0.0 05/18/2011 1547    Studies/Results: No results found.  Medications: I have reviewed the  patient's current medications.  Assessment/Plan: 1. Functional deficits secondary to left BKA which require 3+ hours per day of interdisciplinary therapy in a comprehensive inpatient rehab setting.  Physiatrist is providing close team supervision and 24 hour management of active medical problems listed below.  Physiatrist and rehab team continue to assess barriers to discharge/monitor patient progress toward functional and medical goals.  Medical Problem List and Plan:  1. DVT Prophylaxis/Anticoagulation: Pharmaceutical: Coumadin  2. Pain Management: on scheduled pain medication prior to therapy schedule. Discussed pain mgt in the setting of a BKA with this patient  3. Mood: Ego support provided. Continue to monitor.  4. Neuropsych: This patient is capable of making decisions on his/her own behalf.  5. ESRD: Nephrology assisting with HD needs. Continue 3 X week.  7. Acute on chronic anemia: Routine labs with HD. Continue aranesp weekly. Drop in H/H to 8.5. Monitor for further symptoms--may need transfusion.  8. Right toe ulcer/wound care: continue silvadene cream daily. PRAFO for protection when in bed.  -left BKA clean and intact- watch for increase in erythema  9. HTN: monitor with BID checks. Continue lopressor. 10. Dyslipidemia: continue vytorin.  11. DM type 2: Monitor BS with bid checks-seems to be reasonably controlled at present. Continue Amaryl daily. SSI  12. Constipation: suppository prn.  13. A Fib: Continue to check HR  on bid basis. Continue lopressor. Continue chronic coumadin.  14. CAD with moderate to severe aortic stenosis: monitor for symptoms with increase in activity levels. Continue lopressor, vytorin and coumadin   Length of stay, days: 5  IRETON,SUSAN C , PA-C 07/29/2011, 9:07 AM  I have examined patient and agree with above.  Rene Paci, MD 07/29/2011, 9:07 AM

## 2011-07-29 NOTE — Progress Notes (Signed)
ANTICOAGULATION CONSULT NOTE - Follow Up Consult  Pharmacy Consult for Coumadin Indication:   atrial fibrillation  Allergies  Allergen Reactions  . Ancef (Cefazolin) Rash    Patient Measurements: Height: 6' (182.9 cm) Weight: 229 lb 15 oz (104.3 kg) IBW/kg (Calculated) : 77.6    Vital Signs: Temp: 98 F (36.7 C) (07/21 0648) Temp src: Oral (07/21 0648) BP: 99/46 mmHg (07/21 0648) Pulse Rate: 81  (07/21 0648)  Labs:  Basename 07/29/11 1000 07/28/11 0705 07/27/11 0720 07/26/11 1458  HGB -- -- -- 8.5*  HCT -- -- -- 26.7*  PLT -- -- -- 291  APTT -- -- -- --  LABPROT 35.7* 28.2* 24.0* --  INR 3.51* 2.59* 2.11* --  HEPARINUNFRC -- -- -- --  CREATININE -- -- -- 8.54*  CKTOTAL -- -- -- --  CKMB -- -- -- --  TROPONINI -- -- -- --    Estimated Creatinine Clearance: 9 ml/min (by C-G formula based on Cr of 8.54).  Assessment: 78 yo male with h/o Afib/flutter on chronic coumadin prior to recent hospitalization on 07/16/11 for ischemic left leg.  His coumadin dose PTA was 2.5mg  every MWFSat and 5mg  every TTSun.   He is s/p L BKA POD#9.  His coumadin was resumed on 7/13 (MD dosing) with Lovenox 30mg  SQ q24h bridge (stopped 7/16). Today the INR is supratherapeutic s/p abrupt increase.Currently no bleeding reported. Pt is s/p HD this AM.  Goal of Therapy:  INR 2-3   Plan:  1. No Coumadin today 2. F/u AM INR  Alan [name] K. Allena Katz, PharmD, BCPS.  Clinical Pharmacist Pager [phone]. 07/29/2011 10:52 AM

## 2011-07-30 ENCOUNTER — Inpatient Hospital Stay (HOSPITAL_COMMUNITY): Payer: MEDICARE | Admitting: Physical Therapy

## 2011-07-30 ENCOUNTER — Inpatient Hospital Stay (HOSPITAL_COMMUNITY): Payer: MEDICARE | Admitting: Occupational Therapy

## 2011-07-30 LAB — GLUCOSE, CAPILLARY
Glucose-Capillary: 153 mg/dL — ABNORMAL HIGH (ref 70–99)
Glucose-Capillary: 145 mg/dL — ABNORMAL HIGH (ref 70–99)
Glucose-Capillary: 124 mg/dL — ABNORMAL HIGH (ref 70–99)
Glucose-Capillary: 137 mg/dL — ABNORMAL HIGH (ref 70–99)

## 2011-07-30 LAB — PROTIME-INR
INR: 3.75 — ABNORMAL HIGH (ref 0.00–1.49)
Prothrombin Time: 37.6 seconds — ABNORMAL HIGH (ref 11.6–15.2)

## 2011-07-30 MED ORDER — SORBITOL 70 % SOLN
60.0000 mL | Freq: Once | Status: AC
  Administered 2011-07-30: 60 mL via ORAL
  Filled 2011-07-30: qty 60

## 2011-07-30 MED FILL — Metoprolol Tartrate Tab 25 MG: ORAL | Qty: 1 | Status: AC

## 2011-07-30 MED FILL — Hydrocortisone Acetate Suppos 25 MG: RECTAL | Qty: 1 | Status: AC

## 2011-07-30 MED FILL — Ezetimibe-Simvastatin Tab 10-40 MG: ORAL | Qty: 1 | Status: AC

## 2011-07-30 NOTE — Progress Notes (Signed)
Patient unable to have a BM d/t constipation.  Patient requiring disimpaction, able to pass medium amount of hard stool in the bathroom.  Still have good amount of stool up high, sorbitol given afterward, will monitor patient's BM.

## 2011-07-30 NOTE — Progress Notes (Signed)
Patient ID: Alan [name], male   DOB: 1932/01/07, 78 y.o.   MRN: [id number] Subjective/Complaints: Tired, HD yesterday at 3am Stump throbbing releived with Pain meds Dizzy with am therapies.   A 12 point review of systems has been performed and if not noted above is otherwise negative.   Objective: Vital Signs: Blood pressure 135/70, pulse 60, temperature 98.3 F (36.8 C), temperature source Oral, resp. rate 18, height 6' (1.829 m), weight 101.9 kg (224 lb 10.4 oz), SpO2 97.00%. No results found. No results found for this basename: WBC:2,HGB:2,HCT:2,PLT:2 in the last 72 hours No results found for this basename: NA:2,K:2,CL:2,CO2:2,GLUCOSE:2,BUN:2,CREATININE:2,CALCIUM:2 in the last 72 hours CBG (last 3)   Basename 07/30/11 0733 07/29/11 2122 07/29/11 1640  GLUCAP 152* 153* 117*    Wt Readings from Last 3 Encounters:  07/30/11 101.9 kg (224 lb 10.4 oz)  07/24/11 103.3 kg (227 lb 11.8 oz)  07/24/11 103.3 kg (227 lb 11.8 oz)    Physical Exam:   General:   oriented x 3, No apparent distress--appears fatigued HEENT: Head is normocephalic, atraumatic, PERRLA, EOMI, sclera anicteric, oral mucosa pink and moist, dentition intact, ext ear canals clear,  Neck: Supple without JVD or lymphadenopathy Heart: IrregReg rate and rhythm. SE murmurs. No  rubs or gallops Chest: CTA bilaterally without wheezes, rales, or rhonchi; no distress Abdomen: Soft, non-tender, non-distended, bowel sounds positive. Extremities: No clubbing, cyanosis, or edema. Pulses are 2+ Skin: BK is intact with staples ,small amt serosanguinous drainage. Some hyperemia and bruising around the wound but area does not appear infected/ is not warm- appropriately tender. Right third toe blister dry and lateral malleolus ulcer clean and dry. Neuro: Pt is cognitively appropriate with normal insight, memory, and awareness. Cranial nerves 2-12 are intact. Sensory exam is normal except for distal extremities. Reflexes are 2+ in  all 4's. Fine motor coordination is intact. No tremors. Motor function is grossly 5/5 except for LLE which is 3/5.  Musculoskeletal: Full ROM, No pain with AROM or PROM in the neck, trunk, or extremities. Posture appropriate Psych: Pt's affect is appropriate. Pt is cooperative    Assessment/Plan: 1. Functional deficits secondary to left BKA which require 3+ hours per day of interdisciplinary therapy in a comprehensive inpatient rehab setting. Physiatrist is providing close team supervision and 24 hour management of active medical problems listed below. Physiatrist and rehab team continue to assess barriers to discharge/monitor patient progress toward functional and medical goals. FIM: FIM - Bathing Bathing Steps Patient Completed: Right Arm;Left Arm;Abdomen;Front perineal area;Chest (wife did bath) Bathing: 3: Mod-Patient completes 5-7 79f 10 parts or 50-74%  FIM - Upper Body Dressing/Undressing Upper body dressing/undressing steps patient completed: Pull shirt over trunk Upper body dressing/undressing: 5: Set-up assist to: Obtain clothing/put away FIM - Lower Body Dressing/Undressing Lower body dressing/undressing steps patient completed: Thread/unthread right underwear leg;Thread/unthread left underwear leg Lower body dressing/undressing: 3: Mod-Patient completed 50-74% of tasks  FIM - Toileting Toileting steps completed by patient: Performs perineal hygiene Toileting Assistive Devices: Grab bar or rail for support Toileting: 2: Max-Patient completed 1 of 3 steps  FIM - Diplomatic Services operational officer Devices: Psychiatrist Transfers: 5-To toilet/BSC: Supervision (verbal cues/safety issues);4-From toilet/BSC: Min A (steadying Pt. > 75%)  FIM - Bed/Chair Transfer Bed/Chair Transfer Assistive Devices: Bed rails;Arm rests Bed/Chair Transfer: 4: Sit > Supine: Min A (steadying pt. > 75%/lift 1 leg);3: Chair or W/C > Bed: Mod A (lift or lower assist)  FIM -  Locomotion: Wheelchair Locomotion: Wheelchair: 2: Travels 50 -  149 ft with supervision, cueing or coaxing FIM - Locomotion: Ambulation Locomotion: Ambulation: 0: Activity did not occur  Comprehension Comprehension Mode: Auditory Comprehension: 5-Understands complex 90% of the time/Cues < 10% of the time  Expression Expression Mode: Verbal Expression: 6-Expresses complex ideas: With extra time/assistive device  Social Interaction Social Interaction: 6-Interacts appropriately with others with medication or extra time (anti-anxiety, antidepressant).  Problem Solving Problem Solving: 5-Solves basic problems: With no assist  Memory Memory: 6-More than reasonable amt of time Medical Problem List and Plan:  1. DVT Prophylaxis/Anticoagulation: Pharmaceutical: Coumadin  2. Pain Management: Will schedule pain medication prior to therapy schedule. Discussed pain mgt in the setting of a BKA with this patient 3. Mood:  Ego support provided. Continue to monitor.  4. Neuropsych: This patient is capable of making decisions on his/her own behalf.  5. ESRD: Nephrology to assist with HD needs. Continue 3 X week.  7. Acute on chronic anemia: Routine labs with HD. Continue aranesp weekly. Drop in H/H to 8.5. Monitor for  further symptoms--may need transfusion.    8. Right toe ulcer/woundcare R Lat malleolus decubitus ulcer: continue silvadene cream daily. PRAFO for protection when in bed.   -left BKA clean and intact- watch for increase in erythema 9. HTN: monitor with BID checks. Continue lopressor-hold today due to low blood pressure.  10. Dyslipidemia: continue vytorin.  11. DM type 2: Monitor BS with bid checks-seems to be reasonably controlled at present. Continue Amaryl daily. SSI 12. Constipation:  suppository prn.  13. A Fib: Continue to check HR on bid basis. Continue lopressor. Continue chronic coumadin.  14. CAD with moderate to severe aortic stenosis: monitor for symptoms with increase in  activity levels. Continue lopressor, vytorin and coumadin   LOS (Days) 6 A FACE TO FACE EVALUATION WAS PERFORMED   07/30/2011, 8:54 AM

## 2011-07-30 NOTE — Progress Notes (Signed)
Continent of bowel and bladder. Last bowel movement 07/29/11. HD-T,Th, Sat. Pt oliguria, voiding tea color urine. R lateral ankle with skin tear and dry dressing intact.RLE with nonpitting edema and mild discoloration. R foot with amputated toe.

## 2011-07-30 NOTE — Progress Notes (Signed)
ANTICOAGULATION CONSULT NOTE - Follow Up Consult  Pharmacy Consult for Coumadin Indication:   atrial fibrillation  Allergies  Allergen Reactions  . Ancef (Cefazolin) Rash    Patient Measurements: Height: 6' (182.9 cm) Weight: 224 lb 10.4 oz (101.9 kg) IBW/kg (Calculated) : 77.6    Vital Signs: Temp: 98.3 F (36.8 C) (07/22 0646) Temp src: Oral (07/22 0646) BP: 135/70 mmHg (07/22 0646) Pulse Rate: 60  (07/22 0646)  Labs:  Basename 07/30/11 0605 07/29/11 1000 07/28/11 0705  HGB -- -- --  HCT -- -- --  PLT -- -- --  APTT -- -- --  LABPROT 37.6* 35.7* 28.2*  INR 3.75* 3.51* 2.59*  HEPARINUNFRC -- -- --  CREATININE -- -- --  CKTOTAL -- -- --  CKMB -- -- --  TROPONINI -- -- --    Estimated Creatinine Clearance: 8.9 ml/min (by C-G formula based on Cr of 8.54).  Assessment: 78 yo male with h/o Afib/flutter on chronic coumadin prior to recent hospitalization on 07/16/11 for ischemic left leg.  His coumadin dose PTA was 2.5mg  every MWFSat and 5mg  every TTSun.   He is s/p L BKA POD#10.  His coumadin was resumed on 7/13 (MD dosing) with Lovenox 30mg  SQ q24h bridge (stopped 7/16). Today the INR is supratherapeutic and continues to rise despite no coumadin given yesterday.  Currently no bleeding reported.  No new drug-drug interactions noted to be potential cause of increased INR.  Meal intake consistently 85-100%.  Goal of Therapy:  INR 2-3   Plan:  1. No Coumadin today 2. F/u AM INR  Jill Side L. Illene Bolus, PharmD, BCPS Clinical Pharmacist Pager: [phone] 07/30/2011 9:37 AM

## 2011-07-30 NOTE — Discharge Summary (Signed)
Agree with above  Alan [name] 

## 2011-07-30 NOTE — Progress Notes (Signed)
Physical Therapy Note  Patient Details  Name: Alan [name] MRN: [id number] Date of Birth: [birth year] Today's Date: 07/30/2011  0950-1020 (30 minutes) individual Pain: LT BKA  7/10 /premedicated Focus of treatment: transfer training; bilateral LE AROM/strengthening Treatment: Transfer wc >< mat scoot with max vcs for wc placement, removal of legrests an armrests; transfer scoot with SBA (level); bilateral LE strengthening- Heel slides on right, hip flexion/extension on left, SAQs, hip abduction, ankle pumps on right (all X 20); wc mobility 75 feet min to SBA with difficulty steering wc.  Lutricia Widjaja,JIM 07/30/2011, 10:10 AM

## 2011-07-30 NOTE — Progress Notes (Signed)
Physical Therapy Note  Patient Details  Name: Alan [name] MRN: [id number] Date of Birth: [birth year] Today's Date: 07/30/2011  14:00-15:00 individual therapy pt did not rate pain.  Pt and wife were educated on hip positioning and importance of ROM for potential prosthesis. Performed sliding board transfer to level surface with supervision with vc for technique, laied prone x 2 minutes to stretch hip flexors. Pt only tolerated short time due to difficulty breathing in this position. Side lying hip extension max assist pt was not able to extend past neutral, pt was educated on pressure relief in the bed and wc. Discussed schedule and dialysis.   Julian Reil 07/30/2011, 4:24 PM

## 2011-07-30 NOTE — Progress Notes (Signed)
Occupational Therapy Session Note  Patient Details  Name: Alan [name] MRN: [id number] Date of Birth: 1931/02/11  Today's Date: 07/30/2011 Time: 0900-0955 Time Calculation (min): 55 min  Short Term Goals: Week 1:  OT Short Term Goal 1 (Week 1): Pt will transfer to [facility] with steady assist. OT Short Term Goal 2 (Week 1): Pt will be able to pull pants down for toileting with min assist. OT Short Term Goal 3 (Week 1): Pt will be able to pull pants up with mod assist. OT Short Term Goal 4 (Week 1): Pt will bathe with supervision.  Skilled Therapeutic Interventions/Progress Updates:  Pt scheduled for am ADLS at 9am, but pt declined. He stated "my wife bathed me thoroughly last night and put on these clean clothes".  It took quite a bit of time to motivate pt to sit up in bed and get in w/c.  In the interim, we discussed his therapy schedule with the HD.  He is not accustomed to the change in time of the HD and focuses on this quite a bit.  Discussed who would assist him at home and what he needs to do to prepare for discharge.  Pt eventually agreed to get in w/c, needed assist to steady chair as pt was not fully clearing his hips into the seat.  In gym, UE strengthening activ. And w/c pressups.  Maximum number of press ups pt could do was 3-4 at time.     Therapy Documentation Precautions:  Precautions Precautions: Fall Restrictions Weight Bearing Restrictions: Yes LLE Weight Bearing: Non weight bearing  Pain: Pain Assessment Pain Assessment: 0-10 Pain Score:   6 Pain Type: Surgical pain Pain Location: Leg Pain Orientation: Left Pain Descriptors: Throbbing Pain Onset: Gradual Patients Stated Pain Goal: 2 Pain Intervention(s): Other (Comment) (Nursing provided medication)  See FIM for current functional status  Therapy/Group: Individual Therapy  Sita Mangen 07/30/2011, 11:34 AM

## 2011-07-30 NOTE — Progress Notes (Signed)
Subjective: sitting up in recliner eating dinner; wife at bedside; voiced concern he's getting "too much dialysis...remember when treatment time only 4 hrs"; otherwise doing fine, tolerating rehab; was constipated but had good BM today   Vital signs in last 24 hours: Filed Vitals:   07/29/11 1446 07/30/11 0500 07/30/11 0646 07/30/11 1122  BP:  112/72 135/70 118/64  Pulse:  79 60 81  Temp:  98.1 F (36.7 [name]) 98.3 F (36.8 [name]) 97.9 F (36.6 [name])  TempSrc:  Oral Oral Oral  Resp: 19 18  18   Height:      Weight:  101.9 kg (224 lb 10.4 oz)    SpO2:  99% 97% 98%   Weight change: -5.4 kg (-11 lb 14.5 oz)  Intake/Output Summary (Last 24 hours) at 07/30/11 1509 Last data filed at 07/30/11 1239  Gross per 24 hour  Intake    480 ml  Output    200 ml  Net    280 ml   Labs: Basic Metabolic Panel:  Lab 07/26/11 8295  NA 133*  K 4.0  CL 93*  CO2 24  GLUCOSE 165*  BUN 45*  CREATININE 8.54*  CALCIUM 9.0  ALB --  PHOS 3.9   Liver Function Tests:  Lab 07/26/11 1458  AST --  ALT --  ALKPHOS --  BILITOT --  PROT --  ALBUMIN 2.8*   No results found for this basename: LIPASE:3,AMYLASE:3 in the last 168 hours No results found for this basename: AMMONIA:3 in the last 168 hours CBC:  Lab 07/26/11 1458  WBC 9.3  NEUTROABS --  HGB 8.5*  HCT 26.7*  MCV 90.5  PLT 291   Cardiac Enzymes: No results found for this basename: CKTOTAL:5,CKMB:5,CKMBINDEX:5,TROPONINI:5 in the last 168 hours CBG:  Lab 07/30/11 1120 07/30/11 0733 07/29/11 2122 07/29/11 1640 07/29/11 1113  GLUCAP 145* 152* 153* 117* 218*    Iron Studies: No results found for this basename: IRON,TIBC,TRANSFERRIN,FERRITIN in the last 72 hours Studies/Results: No results found. Medications:      . allopurinol  150 mg Oral Daily  . aspirin EC  81 mg Oral Daily  . bacitracin   Topical Daily  . calcium carbonate  1 tablet Oral TID AC  . darbepoetin      . darbepoetin (ARANESP) injection - DIALYSIS  150 mcg Intravenous  Q Sat-HD  . ezetimibe-simvastatin  1 tablet Oral QHS  . ferric gluconate (FERRLECIT/NULECIT) IV  62.5 mg Intravenous Q Thu-HD  . glimepiride  2 mg Oral QAC breakfast  . HYDROcodone-acetaminophen  1-2 tablet Oral BID  . hydrocortisone  25 mg Rectal BID  . insulin aspart  0-15 Units Subcutaneous TID WC  . metoprolol tartrate  37.5 mg Oral BID  . pantoprazole  40 mg Oral Q1200  . paricalcitol      . paricalcitol  4 mcg Intravenous Q T,Th,Sa-HD  . polyethylene glycol  17 g Oral Daily  . Warfarin - Pharmacist Dosing Inpatient   Does not apply q1800    I  have reviewed scheduled and prn medications.  Physical Exam  General: little uncomfortable, sitting up in wheelchair, NAD  Heart: RRR II/VI murmur  Lungs: diminished, no rhonchi, wheezes or rales  Abdomen:obese soft, obese, NT/ND, +BS Extremities: left BKA with ace dressing; no right LE edema  Dialysis Access: right upper AVF +T/B  Dialysis Prescription: 4 1/4hr, EDW 108.5, QB 500, QD AF 1.5, 2K/2.25Ca. RUA AVF 15G needles. Heparin 3400 Units. Zemplar TIW, No epogen. Venofer 100mg  IV Q 2weekly.  Assessment/Plan  1. PVD with ulceration- S/P Left BKA, 07/21/11 [facility]- POD #9. Now on Rehab with PT/OT.  2. ESRD: HD TTS SWAF; Will need new EDW at time of discharge; check labs pre HD  3. Anemia: hgb 8.5 on 7/18; Aranesp restarted 07/21/11; previously on Venofer 100mg  q2wk; resumed by giving 50mg  qwk; repeat CBC tomorrow pre-HD; follow trend 4. Metabolic Bone disease- Ca 9.0, phos 3.9; controlled on CaCO3 and Zemplar; follow ca/phos 5. Hypertension- controlled on meds (metoprolol tartrate 37.5 bid) and UF with HD 6. DM/HLD- Vytorin, Amaryl; CBG and SSI; HgbA1c 6.3%; primary following 7. Nutrition- albumin low; ^ protein renal carb modified diet; supple prn 8. Afib/flutter-on Coumadin with INR 3.75 today; no active bleeding; pharmacy following 9. Hemorrhoids - improving on anusol HC - daily miralax 10. Gout- no problems on  allopurinol 11. Disposition- per rehab; same oupt HD (TTS) schedule  Alan Germany, Alan [name] [facility] Pager [phone]  07/30/2011,3:09 PM  LOS: 6 days     Alan [name]

## 2011-07-30 NOTE — Progress Notes (Signed)
Physical Therapy Session Note  Patient Details  Name: Alan [name] MRN: [id number] Date of Birth: 09/29/1931  Today's Date: 07/30/2011 Time: 1530-1605 Time Calculation (min): 35 min  Short Term Goals: Week 1:  PT Short Term Goal 1 (Week 1): Same as LTGs  Skilled Therapeutic Interventions/Progress Updates:   Wife present; patient now on 15 hours of therapy over 7 days; patient very fatigued this pm; patient agreed to 30 minute session; patient performed w/c mobility in controlled environment x 100' with min A for UE propulsion sequence to maintain straight path in hallway (patient tends to steer to the R).  Demonstrated to patient where to position w/c and w/c parts management to set up for slide board transfer w/c > simulated car.  Patient performed set up and transfer into car with wife observing and min A but with max verbal and visual cues for safety and sequence.  To transfer out of car patient required mod-max A and total verbal cues for safety, hand placement, sequence with focus on anterior lean to prevent pushing buttocks off front of board.  Discussed the need to perform real car transfer prior to D/C.  Also demonstrated to wife how to break down w/c and remove wheels to store w/c in car and how to set w/c up for transfer back into w/c.  Wife gave repeat demonstration.  Wife concerned about how patient will use the bathroom if the w/c does not fit into bathroom; advised wife to measure all door ways and then coordinate with OT and primary PT to determine if patient will be able to ambulate into bathroom with RW or if he will need to sponge bathe in kitchen and use BSC in bedroom.  Patient tolerated session well.   Therapy Documentation Precautions:  Precautions Precautions: Fall Restrictions Weight Bearing Restrictions: Yes LLE Weight Bearing: Non weight bearing General: Amount of Missed PT Time (min): 10 Minutes Missed Time Reason: Patient fatigue Vital Signs: Therapy  Vitals Temp: 97.9 F (36.6 C) Temp src: Oral Pulse Rate: 82  Resp: 18  BP: 100/53 mmHg Patient Position, if appropriate: Sitting Oxygen Therapy SpO2: 97 % O2 Device: None (Room air) Pain:  no c/o pain but did c/o fatigue.  See FIM for current functional status  Therapy/Group: Individual Therapy  Edman Circle [facility] 07/30/2011, 4:10 PM

## 2011-07-30 NOTE — Progress Notes (Signed)
Occupational Therapy Session Note  Patient Details  Name: Alan [name] MRN: [id number] Date of Birth: [birth year]  Today's Date: 07/29/2011 Time: 0865-7846 Time Calculation:  45 min   Skilled Therapeutic Interventions/Progress Updates: This note transcribed for Charmayne Sheer. Redressed left lower extremity with ace wrap and curlex.  Discussed residual leg care and exercises to promote increased circulation.     Therapy Documentation Precautions:  Precautions Precautions: Fall Restrictions Weight Bearing Restrictions: Yes LLE Weight Bearing: Non weight bearing  Pain: None  See FIM for current functional status  Therapy/Group: Individual Therapy  Collier Salina transcribed for Charmayne Sheer 07/30/2011, 12:03 PM

## 2011-07-30 NOTE — Progress Notes (Signed)
Left leg BKA with stapples, kerlex, and ace wrap changed the second time this after noon d/t moderate draining blood.

## 2011-07-31 ENCOUNTER — Encounter (HOSPITAL_COMMUNITY): Payer: MEDICARE | Admitting: Occupational Therapy

## 2011-07-31 ENCOUNTER — Inpatient Hospital Stay (HOSPITAL_COMMUNITY): Payer: MEDICARE | Admitting: Occupational Therapy

## 2011-07-31 ENCOUNTER — Inpatient Hospital Stay (HOSPITAL_COMMUNITY): Payer: MEDICARE

## 2011-07-31 LAB — CBC
Hemoglobin: 8.9 g/dL — ABNORMAL LOW (ref 13.0–17.0)
MCH: 29.4 pg (ref 26.0–34.0)
Platelets: 315 10*3/uL (ref 150–400)
RBC: 3.03 MIL/uL — ABNORMAL LOW (ref 4.22–5.81)
WBC: 8.8 10*3/uL (ref 4.0–10.5)

## 2011-07-31 LAB — GLUCOSE, CAPILLARY
Glucose-Capillary: 172 mg/dL — ABNORMAL HIGH (ref 70–99)
Glucose-Capillary: 131 mg/dL — ABNORMAL HIGH (ref 70–99)

## 2011-07-31 LAB — RENAL FUNCTION PANEL
CO2: 23 mEq/L (ref 19–32)
Calcium: 8.3 mg/dL — ABNORMAL LOW (ref 8.4–10.5)
GFR calc Af Amer: 6 mL/min — ABNORMAL LOW (ref >90)
GFR calc non Af Amer: 5 mL/min — ABNORMAL LOW (ref >90)
Potassium: 3.7 mEq/L (ref 3.5–5.1)
Sodium: 130 mEq/L — ABNORMAL LOW (ref 135–145)

## 2011-07-31 MED ORDER — BOOST / RESOURCE BREEZE PO LIQD
1.0000 | ORAL | Status: DC
  Administered 2011-07-31 – 2011-08-06 (×5): 1 via ORAL

## 2011-07-31 MED ORDER — PARICALCITOL 5 MCG/ML IV SOLN
INTRAVENOUS | Status: AC
  Administered 2011-07-31: 4 ug via INTRAVENOUS
  Filled 2011-07-31: qty 1

## 2011-07-31 MED ORDER — SENNOSIDES-DOCUSATE SODIUM 8.6-50 MG PO TABS
2.0000 | ORAL_TABLET | Freq: Two times a day (BID) | ORAL | Status: DC
  Administered 2011-07-31 – 2011-08-08 (×16): 2 via ORAL
  Filled 2011-07-31 (×9): qty 2
  Filled 2011-07-31: qty 1
  Filled 2011-07-31 (×7): qty 2

## 2011-07-31 NOTE — Progress Notes (Signed)
INITIAL ADULT NUTRITION ASSESSMENT Date: 07/31/2011   Time: 9:22 AM  Reason for Assessment: Poor PO Intake  INTERVENTION: 1. Recommend Rena-Vite daily 2. Recommend consideration of diet liberalization to promote additional PO variety 3. Resource Breeze PO daily 4. RD to continue to follow nutrition care plan  ASSESSMENT: Male 78 y.o.  Dx: Unilateral complete BKA  Hx:  Past Medical History  Diagnosis Date  . Diabetes mellitus   . Gout   . Hypertension   . Hyperlipidemia   . Chronic kidney disease     Started dialysis October 2012  . Arthritis   . Prostate cancer     s/p seed implant  . Cataract     BILATERAL-BEEN REMOVED  . Neuromuscular disorder     DABETIC NEUROPATHY-LOWER EXTREMITY  . Atrial fibrillation     on coumadin  . Aortic stenosis     moderate to severe, not felt to be a surgical candidate  . Anemia     secondary to end stage renal disease  . Coronary artery disease     remote CABG in 1996; last stress test in 2005; last cath 2005  . Diabetic neuropathy   . Edema   . PAD (peripheral artery disease)   . Diabetic retinopathy   . Chronic anticoagulation     on coumadin  . Nephrolithiasis     prior history of percutaneous nephrostomy  . Shortness of breath    Past Surgical History  Procedure Date  . Ventral hernia repair   . Inguinal hernia repair     right side  . Colonoscopy   . Kidney stone surgery   . Polypectomy   . Tonsillectomy   . Toe surgery     removal little toe right foot  . Cardiac catheterization 04/19/2003    SEVERE 2 VESSEL OBSTRUCTIVE ATHERSCLEROTIC CAD  . Coronary artery bypass graft 1996    LIMA GRAFT TO THE LAD, SEQUENTIAL SAPHENOUS  VEIN GRAFT TO THE FIRST AND SECOND DIAGONIAL BRANCHES, SEQUENTIAL SAPHENOUS VEIN GRAFT TO THE ACUTE MARGINAL, POSTERIOR DESCENDING, AND POSTERIOR LATERAL BRANCHES OF THE RIGHT CORONARY ARTERY  . Radioactive seed implant   . US echocardiography 09/06/2009    EF 55-60%  . Cardiovascular stress test  04/13/2003    EF 54%. EVIDENCE OF ANTERO-APICAL ISCHEMIA. NORMAL LV SIZE AND FUNCTION  . Tee without cardioversion 05/09/2011    Procedure: TRANSESOPHAGEAL ECHOCARDIOGRAM (TEE);  Surgeon: Laurey Morale, MD;  Location: [facility] ENDOSCOPY;  Service: Cardiovascular;  Laterality: N/A;  . Hernia repair   . Amputation 07/20/2011    Procedure: AMPUTATION BELOW KNEE;  Surgeon: Nada Libman, MD;  Location: [facility] OR;  Service: Vascular;  Laterality: Left;   Related Meds:     . allopurinol  150 mg Oral Daily  . aspirin EC  81 mg Oral Daily  . bacitracin   Topical Daily  . calcium carbonate  1 tablet Oral TID AC  . darbepoetin (ARANESP) injection - DIALYSIS  150 mcg Intravenous Q Sat-HD  . ezetimibe-simvastatin  1 tablet Oral QHS  . ferric gluconate (FERRLECIT/NULECIT) IV  62.5 mg Intravenous Q Thu-HD  . glimepiride  2 mg Oral QAC breakfast  . HYDROcodone-acetaminophen  1-2 tablet Oral BID  . hydrocortisone  25 mg Rectal BID  . insulin aspart  0-15 Units Subcutaneous TID WC  . metoprolol tartrate  37.5 mg Oral BID  . pantoprazole  40 mg Oral Q1200  . paricalcitol  4 mcg Intravenous Q T,Th,Sa-HD  . polyethylene glycol  17 g  Oral Daily  . senna-docusate  2 tablet Oral BID  . sorbitol  60 mL Oral Once  . Warfarin - Pharmacist Dosing Inpatient   Does not apply q1800   Ht: 6' (182.9 cm)  Wt: 226 lb 6.6 oz (102.7 kg) Adjusted Wt: 109.8 kg  Ideal Wt: 80.9 kg Adjusted Ideal Wt: 86.5 kg % Ideal Wt: 119%  Wt Readings from Last 15 Encounters:  07/31/11 226 lb 6.6 oz (102.7 kg)  07/24/11 227 lb 11.8 oz (103.3 kg)  07/24/11 227 lb 11.8 oz (103.3 kg)  07/18/11 240 lb 14.4 oz (109.272 kg)  07/16/11 243 lb (110.224 kg)  06/27/11 240 lb (108.863 kg)  06/27/11 240 lb (108.863 kg)  06/20/11 239 lb (108.41 kg)  06/18/11 245 lb 9.6 oz (111.403 kg)  05/12/11 251 lb 5.2 oz (114 kg)  05/12/11 251 lb 5.2 oz (114 kg)  02/07/11 247 lb (112.038 kg)  11/22/10 251 lb 6.4 oz (114.034 kg)  04/20/10 290 lb (131.543  kg)  04/11/10 297 lb 3.2 oz (134.809 kg)  Usual Wt: 240 - 250 lb % Usual Wt: 92%  BMI is 32.8 (using adjusted wt for BKA): meets criteria for Obesity Class I  Food/Nutrition Related Hx: doesn't adhere to dietary restrictions at home  Labs:  CMP     Component Value Date/Time   NA 133* 07/26/2011 1458   K 4.0 07/26/2011 1458   CL 93* 07/26/2011 1458   CO2 24 07/26/2011 1458   GLUCOSE 165* 07/26/2011 1458   BUN 45* 07/26/2011 1458   CREATININE 8.54* 07/26/2011 1458   CALCIUM 9.0 07/26/2011 1458   CALCIUM 8.8 10/27/2010 0734   PROT 6.8 05/18/2011 1547   ALBUMIN 2.8* 07/26/2011 1458   AST 45* 05/18/2011 1547   ALT 10 05/18/2011 1547   ALKPHOS 77 05/18/2011 1547   BILITOT 0.3 05/18/2011 1547   GFRNONAA 5* 07/26/2011 1458   GFRAA 6* 07/26/2011 1458   Phosphorus  Date/Time Value Range Status  07/26/2011  2:58 PM 3.9  2.3 - 4.6 mg/dL Final  02/07/8655 84:69 PM 4.2  2.3 - 4.6 mg/dL Final  06/10/9526  4:13 AM 3.2  2.3 - 4.6 mg/dL Final   Potassium  Date/Time Value Range Status  07/26/2011  2:58 PM 4.0  3.5 - 5.1 mEq/L Final  07/23/2011 12:42 PM 4.6  3.5 - 5.1 mEq/L Final  07/21/2011  7:58 AM 5.1  3.5 - 5.1 mEq/L Final   CBG (last 3)   Basename 07/31/11 0723 07/30/11 2356 07/30/11 2136  GLUCAP 120* 118* 137*    Lab Results  Component Value Date   HGBA1C 6.3* 07/20/2011    Intake/Output Summary (Last 24 hours) at 07/31/11 0954 Last data filed at 07/31/11 0800  Gross per 24 hour  Intake    840 ml  Output      0 ml  Net    840 ml   Diet Order: Renal 80 - 90  Supplements/Tube Feeding: none  IVF:    Estimated Nutritional Needs:   Kcal: 2000 - 2200 kcal Protein: 90 - 100 grams Fluid:  1.2 liters daily  Pt underwent L BKA during acute hospitalization. Past medical hx of BLE ulcers with PVD, DM and ESRD.  RD consulted for poor PO intake. Noted pt with recent issues of constipation. Required disimpaction yesterday.   Noted PO intake of 80 - 90% x 2 days. Pt states that he is eating  mostly salads and sometimes will choose the daily entrees for lunch or dinner. Doesn't like  all of the foods being sent to him. Agreeable to trying [employer] daily.   NUTRITION DIAGNOSIS: -Inadequate protein energy intake (NI-5.3).  Status: Ongoing  RELATED TO: food choices  AS EVIDENCE BY: pt report  MONITORING/EVALUATION(Goals): Goal: Pt to meet >/= 90% of their estimated nutrition needs Monitor: PO intake, weights, labs, I/O's  EDUCATION NEEDS: -No education needs identified at this time   DOCUMENTATION CODES Per approved criteria  -Obesity Unspecified   Jarold Motto MS, RD, LDN Pager: [phone] After-hours pager: [phone]

## 2011-07-31 NOTE — Progress Notes (Signed)
Physical Therapy Session Note  Patient Details  Name: Alan [name] MRN: [id number] Date of Birth: 1931-11-27  Today's Date: 07/31/2011 Time: 0815-0825 Time Calculation (min): 10 min  Short Term Goals: Week 1:  PT Short Term Goal 1 (Week 1): Same as LTGs  Skilled Therapeutic Interventions/Progress Updates: pt declined therapy due to fatigue/lack of sleep due to nursing procedures last night for disimpaction/constipation, as well as pain from hemoirroids.  RN confirmed.      Therapist discussed need for mobility, even simply rolling and bed mobility, to improve bowel function, etc, but pt declined.  Pt appeared asleep when therapist left room.  Therapy Documentation Precautions:  Precautions Precautions: Fall Restrictions Weight Bearing Restrictions: Yes LLE Weight Bearing: Non weight bearing General: Amount of Missed PT Time (min): 45 Minutes Missed Time Reason: Patient fatigue (pt reported lack of sleep due to nursing procedures)      Therapy/Group: Individual Therapy  Aleaya Latona 07/31/2011, 9:40 AM

## 2011-07-31 NOTE — Progress Notes (Signed)
Occupational Therapy Session Note  Patient Details  Name: Alan [name] MRN: [id number] Date of Birth: 1931/01/17  Today's Date: 07/31/2011 Time: 0815-0825 Time Calculation (min): 10 min   Skilled Therapeutic Interventions/Progress Updates: Patient declined OT session.  Receiving suppository at start of session.  Patient reports being up all night with bowel issues.      Therapy Documentation Precautions:  Precautions Precautions: Fall Restrictions Weight Bearing Restrictions: Yes LLE Weight Bearing: Non weight bearing General: General Amount of Missed OT Time (min): 35 Minutes Pain: Hemorrhoid pain  See FIM for current functional status  Therapy/Group: Individual Therapy  Collier Salina 07/31/2011, 8:34 AM

## 2011-07-31 NOTE — Care Management Note (Signed)
Per State Regulation 482.30 This chart was reviewed for medical necessity with respect to the patient's Admission/Duration of stay. Wound care.  Pain management.  Constipation relief.   Brock Ra                 Nurse Care Manager              Next Review Date: 08/03/11

## 2011-07-31 NOTE — Progress Notes (Signed)
Patient ID: Alan [name], male   DOB: Sep 05, 1931, 78 y.o.   MRN: [id number] Subjective/Complaints: Constipation and bowel meds kept him awake  A 12 point review of systems has been performed and if not noted above is otherwise negative.   Objective: Vital Signs: Blood pressure 110/62, pulse 78, temperature 98.4 F (36.9 C), temperature source Oral, resp. rate 17, height 6' (1.829 m), weight 102.7 kg (226 lb 6.6 oz), SpO2 96.00%. No results found. No results found for this basename: WBC:2,HGB:2,HCT:2,PLT:2 in the last 72 hours No results found for this basename: NA:2,K:2,CL:2,CO2:2,GLUCOSE:2,BUN:2,CREATININE:2,CALCIUM:2 in the last 72 hours CBG (last 3)   Basename 07/31/11 0723 07/30/11 2356 07/30/11 2136  GLUCAP 120* 118* 137*    Wt Readings from Last 3 Encounters:  07/31/11 102.7 kg (226 lb 6.6 oz)  07/24/11 103.3 kg (227 lb 11.8 oz)  07/24/11 103.3 kg (227 lb 11.8 oz)    Physical Exam:   General:   oriented x 3, No apparent distress--appears fatigued HEENT: Head is normocephalic, atraumatic, PERRLA, EOMI, sclera anicteric, oral mucosa pink and moist, dentition intact, ext ear canals clear,  Neck: Supple without JVD or lymphadenopathy Heart: IrregReg rate and rhythm. SE murmurs. No  rubs or gallops Chest: CTA bilaterally without wheezes, rales, or rhonchi; no distress Abdomen: Soft, non-tender, non-distended, bowel sounds positive. Extremities: No clubbing, cyanosis, or edema. Pulses are 2+ Skin: BK is intact with staples ,small amt serosanguinous drainage. Some hyperemia and bruising around the wound but area does not appear infected/ is not warm- appropriately tender. Right third toe blister dry and lateral malleolus ulcer clean and dry. Neuro: Pt is cognitively appropriate with normal insight, memory, and awareness. Cranial nerves 2-12 are intact. Sensory exam is normal except for distal extremities. Reflexes are 2+ in all 4's. Fine motor coordination is intact. No  tremors. Motor function is grossly 5/5 except for LLE which is 3/5.  Musculoskeletal: Full ROM, No pain with AROM or PROM in the neck, trunk, or extremities. Posture appropriate Psych: Pt's affect is appropriate. Pt is cooperative    Assessment/Plan: 1. Functional deficits secondary to left BKA which require 3+ hours per day of interdisciplinary therapy in a comprehensive inpatient rehab setting. Physiatrist is providing close team supervision and 24 hour management of active medical problems listed below. Physiatrist and rehab team continue to assess barriers to discharge/monitor patient progress toward functional and medical goals. FIM: FIM - Bathing Bathing Steps Patient Completed: Right Arm;Left Arm;Abdomen;Front perineal area;Chest (wife did bath) Bathing: 3: Mod-Patient completes 5-7 50f 10 parts or 50-74%  FIM - Upper Body Dressing/Undressing Upper body dressing/undressing steps patient completed: Pull shirt over trunk Upper body dressing/undressing: 5: Set-up assist to: Obtain clothing/put away FIM - Lower Body Dressing/Undressing Lower body dressing/undressing steps patient completed: Thread/unthread right underwear leg;Thread/unthread left underwear leg Lower body dressing/undressing: 3: Mod-Patient completed 50-74% of tasks  FIM - Toileting Toileting steps completed by patient: Performs perineal hygiene Toileting Assistive Devices: Grab bar or rail for support Toileting: 2: Max-Patient completed 1 of 3 steps  FIM - Diplomatic Services operational officer Devices: Psychiatrist Transfers: 5-To toilet/BSC: Supervision (verbal cues/safety issues);4-From toilet/BSC: Min A (steadying Pt. > 75%)  FIM - Bed/Chair Transfer Bed/Chair Transfer Assistive Devices: Bed rails;Arm rests Bed/Chair Transfer: 4: Sit > Supine: Min A (steadying pt. > 75%/lift 1 leg);3: Chair or W/C > Bed: Mod A (lift or lower assist)  FIM - Locomotion: Wheelchair Locomotion: Wheelchair: 2:  Travels 50 - 149 ft with supervision, cueing or coaxing FIM -  Locomotion: Ambulation Locomotion: Ambulation: 0: Activity did not occur  Comprehension Comprehension Mode: Auditory Comprehension: 5-Understands basic 90% of the time/requires cueing < 10% of the time  Expression Expression Mode: Verbal Expression: 6-Expresses complex ideas: With extra time/assistive device  Social Interaction Social Interaction: 6-Interacts appropriately with others with medication or extra time (anti-anxiety, antidepressant).  Problem Solving Problem Solving: 5-Solves basic problems: With no assist  Memory Memory: 4-Recognizes or recalls 75 - 89% of the time/requires cueing 10 - 24% of the time Medical Problem List and Plan:  1. DVT Prophylaxis/Anticoagulation: Pharmaceutical: Coumadin  2. Pain Management: Will schedule pain medication prior to therapy schedule. Discussed pain mgt in the setting of a BKA with this patient 3. Mood:  Ego support provided. Continue to monitor.  4. Neuropsych: This patient is capable of making decisions on his/her own behalf.  5. ESRD: Nephrology to assist with HD needs. Continue 3 X week.  7. Acute on chronic anemia: Routine labs with HD. Continue aranesp weekly. Drop in H/H to 8.5. Monitor for  further symptoms--may need transfusion.    8. Right toe ulcer/woundcare R Lat malleolus decubitus ulcer: continue silvadene cream daily. PRAFO for protection when in bed.   -left BKA clean and intact- watch for increase in erythema 9. HTN: monitor with BID checks. Continue lopressor-hold today due to low blood pressure.  10. Dyslipidemia: continue vytorin.  11. DM type 2 with severe neuropathy: Monitor BS with bid checks-seems to be reasonably controlled at present. Continue Amaryl daily. SSI 12. Constipation:  suppository prn.  13. A Fib: Continue to check HR on bid basis. Continue lopressor. Continue chronic coumadin.  14. CAD with moderate to severe aortic stenosis: monitor for  symptoms with increase in activity levels. Continue lopressor, vytorin and coumadin, no signs of failure   LOS (Days) 7 A FACE TO FACE EVALUATION WAS PERFORMED   07/31/2011, 8:33 AM

## 2011-07-31 NOTE — Progress Notes (Signed)
Patient has complaints of not sleeping well last night related to receiving multiple laxatives to relieve constipation and complains that sacral area and rectal area "hurts." Patient unable to participate in therapies this am and rested in bed all morning. R foot second toe, top layer of skin torn off and R third toe has wound with bacitracin ointment applied. No unsafe behaviors noted. Wife at the bedside. Continue plan of care.

## 2011-07-31 NOTE — Progress Notes (Signed)
Nursing Note: Pt given 60 cc of sorbitol per orders.

## 2011-07-31 NOTE — Progress Notes (Signed)
ANTICOAGULATION CONSULT NOTE - Follow Up Consult  Pharmacy Consult for Coumadin Indication:   atrial fibrillation  Allergies  Allergen Reactions  . Ancef (Cefazolin) Rash    Patient Measurements: Height: 6' (182.9 cm) Weight: 226 lb 6.6 oz (102.7 kg) IBW/kg (Calculated) : 77.6    Vital Signs: Temp: 98.4 F (36.9 C) (07/23 0500) Temp src: Oral (07/23 0500) BP: 110/62 mmHg (07/23 0500) Pulse Rate: 78  (07/23 0500)  Labs:  Alan [name] 07/31/11 0654 07/30/11 0605 07/29/11 1000  HGB -- -- --  HCT -- -- --  PLT -- -- --  APTT -- -- --  LABPROT 40.4* 37.6* 35.7*  INR 4.11* 3.75* 3.51*  HEPARINUNFRC -- -- --  CREATININE -- -- --  CKTOTAL -- -- --  CKMB -- -- --  TROPONINI -- -- --    Estimated Creatinine Clearance: 9 ml/min (by C-G formula based on Cr of 8.54).  Assessment: 78 yo male with h/o Afib/flutter on chronic coumadin prior to recent hospitalization on 07/16/11 for ischemic left leg.  His coumadin dose PTA was 2.5mg  every MWFSat and 5mg  every TTSun.   He is s/p L BKA POD#11.  His coumadin was resumed on 7/13 (MD dosing) with Lovenox 30mg  SQ q24h bridge (stopped 7/16). Today the INR is supratherapeutic and continues to rise despite no coumadin given x 2 days.  Spoke with RN who reported no evidence of bleeding.  No new drug-drug interactions noted to be potential cause of increased INR.  Meal intake consistently 85-100%.  Goal of Therapy:  INR 2-3   Plan:  1. No Coumadin today 2. F/u AM INR  Jill Side L. Illene Bolus, PharmD, BCPS Clinical Pharmacist Pager: [phone] 07/31/2011 3:20 PM

## 2011-07-31 NOTE — Progress Notes (Addendum)
Physical Therapy Session Note  Patient Details  Name: Alan [name] MRN: [id number] Date of Birth: October 17, 1931  Today's Date: 07/31/2011 Time: 1415-1500 Time Calculation (min): 45 min  Short Term Goals: Week 1:  PT Short Term Goal 1 (Week 1): Same as LTGs  Skilled Therapeutic Interventions/Progress Updates: pt dressed, and agreeable to therapy  Focus on bed mobility, transfers, w/c mobility, pre-gait.  Wife observed tx. Alan [name] stated that son will build ramp for home entry; pt is not a good candidate for managing stairs.  Supine with elevated head, use of bed rail> sitting with close supervision.  SB transfer to L with min assist for L hip facilitation, keeping hips back on board.  W/c mobility using bil UEs, x 60' with min assist due to veering R, on tile and carpet.  Therapeutic exercise performed in sitting, with bil LE to increase strength for functional mobility: L and R knee extension with isometric contraction at end range, x 10 each.  L knee quad sets 10 x 1.  bil hip IR 10 x 1 to decrease hip position in ER.  Pre-gait training in parallel bars, max assist to stand, pulling up on bars: x 25 seconds, 36 seconds, 41 seconds.  Pt reported LLE throbbing when in dependent position.    Stand/pivot transfer w/c> bed to R, with max assist.  Pt unable to pivot RLE.  Bed mobility with frequent VCS, tactile cues due to pt's difficulty with motor planning to scoot up and down in bed.      Therapy Documentation Precautions:  Precautions Precautions: Fall Restrictions Weight Bearing Restrictions: Yes LLE Weight Bearing: Non weight bearing General:   Vital Signs: Therapy Vitals Temp: 97.6 F (36.4 C) Pain: Pain Assessment Pain Assessment: 0-10 Pain Score:   2 Pain Location: Leg Pain Orientation: Left Pain Descriptors: Throbbing Pain Intervention(s): Medication (See eMAR)     See FIM for current functional status  Therapy/Group: Individual  Therapy  Alan [name] 07/31/2011, 3:52 PM

## 2011-07-31 NOTE — Progress Notes (Signed)
A: Mini-enema given and pt has small ,hard balls .Checked rectal area gently and able to remove a small-moderate amount of hard balls.Pt states he feel "miserable and unable to empty".A: Obtained order for SSE and later pt up to bsc and had hard stool hanging out and stool dropped when bottom was cleaned as pt sat on [email or web address]

## 2011-07-31 NOTE — Procedures (Signed)
Stable on hemodialysis. Alert and appropriate.  AV access RUE, goal 3000cc. BP stable . No changes implemented. Cont HD as Rx'd. Brihany Butch C

## 2011-07-31 NOTE — Progress Notes (Signed)
Occupational Therapy Note  Patient Details  Name: Alan [name] MRN: [id number] Date of Birth: Oct 14, 1931 Today's Date: 07/31/2011  Pt missed 60 min of OT from 10-11am.  Pt declined to participate in OT due to fatigue and issue with constipation.  Pt stated he felt terrible. Nursing staff was attending to patient.  SAGUIER,JULIA 07/31/2011, 10:31 AM

## 2011-08-01 ENCOUNTER — Inpatient Hospital Stay (HOSPITAL_COMMUNITY): Payer: MEDICARE | Admitting: Occupational Therapy

## 2011-08-01 ENCOUNTER — Inpatient Hospital Stay (HOSPITAL_COMMUNITY): Payer: MEDICARE

## 2011-08-01 ENCOUNTER — Inpatient Hospital Stay (HOSPITAL_COMMUNITY): Payer: MEDICARE | Admitting: *Deleted

## 2011-08-01 ENCOUNTER — Inpatient Hospital Stay (HOSPITAL_COMMUNITY): Payer: MEDICARE | Admitting: Physical Therapy

## 2011-08-01 LAB — GLUCOSE, CAPILLARY: Glucose-Capillary: 82 mg/dL (ref 70–99)

## 2011-08-01 LAB — PROTIME-INR: Prothrombin Time: 36.4 seconds — ABNORMAL HIGH (ref 11.6–15.2)

## 2011-08-01 MED ORDER — WARFARIN SODIUM 1 MG PO TABS
1.0000 mg | ORAL_TABLET | Freq: Once | ORAL | Status: AC
  Administered 2011-08-01: 1 mg via ORAL
  Filled 2011-08-01: qty 1

## 2011-08-01 NOTE — Progress Notes (Signed)
Physical Therapy Session Note  Patient Details  Name: Alan [name] MRN: [id number] Date of Birth: 09/20/1931  Today's Date: 08/01/2011 Time: 4098-1191 Time Calculation (min): 40 min  Skilled Therapeutic Interventions/Progress Updates:  Pt speaking with MD upon arrival, visibly upset. Pt preferring bedside tx today. Tx focused on therex for bil LE strengthening and activity tolerance as well as standing tolerance.  Pt performed each of the following 2x15 with cues for technique:  Hip ADD ball squeeze, hip ABD against orange theraband LLE: LAQ with 3 sec hold, marching RLE: LAQ with 3#s, marching with 3#s, AP  Pt navigated WC in room around obstacles into bathroom, where performed sit<>stand at grab bars with Mod A for lifting and safe lowering. Pt stood 1x35' and 1x25', limited by fatigue and encouragement to continue. Pt needing cues for full hip and knee ext as well as upright trunk, tending to place forehead on wall.  Adjusted pt's leg rest for optimal extended positioning and locked bed in LE extended position. Reinforced importance of LE strengthening and positioning for optimal outcomes. Pt and wife express understanding.      Therapy Documentation Precautions:  Precautions Precautions: Fall Restrictions Weight Bearing Restrictions: Yes LLE Weight Bearing: Non weight bearing General: Amount of Missed PT Time (min): 5 Minutes (Pt speaking with MD) Missed Time Reason: Other (comment) (Pt speaking with MD) Vital Signs:   Pain: Pain Assessment Pain Assessment: No/denies pain    See FIM for current functional status  Therapy/Group: Individual Therapy  Virl Cagey, PT 08/01/2011, 3:11 PM

## 2011-08-01 NOTE — Progress Notes (Signed)
Patient was uncooperative this morning, attempted to transfer himself from toilet to the wheel chair. Nursing manager at bedside attempting to implement a safety plan.   Patient refused quick release belt, stating that staffs are trying to tie him up and that staffs are not doing their job.

## 2011-08-01 NOTE — Progress Notes (Signed)
I spoke with Alan [name] again this afternoon regarding the concerns this morning and he states that everything has been taken care of and there is no longer a problem.  Of note Alan [name] OT spoke with the patient earlier about the safety plan and potentially moving his room closer to the nurses station.  He continues to refuse the quick release belt and does not wish to move closer to the station.  She spoke to the safety plan and that it was in place to help keep him safe to which he responded "I'll be fine".  We will continue to closely monitor the patient and I have encouraged staff to increase the frequency of rounding and offering toileting to the patient.

## 2011-08-01 NOTE — Progress Notes (Signed)
Occupational Therapy Session Note  Patient Details  Name: Alan [name] MRN: [id number] Date of Birth: 10/30/1931  Today's Date: 08/01/2011 Time: 1130-1200 Time Calculation (min): 30 min  Short Term Goals: Week 1:  OT Short Term Goal 1 (Week 1): Pt will transfer to [facility] with steady assist. OT Short Term Goal 2 (Week 1): Pt will be able to pull pants down for toileting with min assist. OT Short Term Goal 3 (Week 1): Pt will be able to pull pants up with mod assist. OT Short Term Goal 4 (Week 1): Pt will bathe with supervision.  Skilled Therapeutic Interventions/Progress Updates:  Patient upset and agitated through majority of tx session. Patient upset that nursing wants to put a self releasing belt around patient when up in w/c. Patient's wife was present during tx session. Patient was very verbally abusive towards wife and therapist at times. Patient is not open to education and does not want to listen when education is given. Patient seems to be confused about things and as a result becomes agitated when corrected. -Tx session with focus on UB strengthening, endurance, abdominal strengthening to increase functional performance during LB ADL tasks.  -ScitFit; Bil UE; Level 3; 2 trials (forward and backwards); 6 mins each. -5lb weighted ball; Bil UE; shoulder flexion/extension; chest press; seated abdominal rotation; 1 set; 12 reps; education provided for technique and to keep abdominal muscles tight during exercises.  Therapy Documentation Precautions:  Precautions Precautions: Fall Restrictions Weight Bearing Restrictions: Yes LLE Weight Bearing: Non weight bearing Pain: Pain Assessment Pain Assessment: No/denies pain  See FIM for current functional status  Therapy/Group: Individual Therapy  Limmie Patricia, OTR/L 08/01/2011, 12:06 PM

## 2011-08-01 NOTE — Plan of Care (Signed)
Problem: RH SAFETY Goal: RH STG ADHERE TO SAFETY PRECAUTIONS W/ASSISTANCE/DEVICE STG Adhere to Safety Precautions With Mod I Assistance/Device.  Outcome: Not Progressing Patient having  impulsive behavior, refused safety belt and attempted to transfer  himself from toilet seat to wheelchair even RN is present.

## 2011-08-01 NOTE — Progress Notes (Signed)
ANTICOAGULATION CONSULT NOTE - Follow Up Consult  Pharmacy Consult for Coumadin Indication:   atrial fibrillation  Allergies  Allergen Reactions  . Ancef (Cefazolin) Rash    Patient Measurements: Height: 6' (182.9 cm) Weight: 229 lb 8 oz (104.1 kg) IBW/kg (Calculated) : 77.6    Vital Signs: Temp: 98.3 F (36.8 C) (07/24 0700) Temp src: Oral (07/24 0700) BP: 104/47 mmHg (07/24 0915) Pulse Rate: 62  (07/24 0915)  Labs:  Basename 08/01/11 0600 07/31/11 1611 07/31/11 0654 07/30/11 0605  HGB -- 8.9* -- --  HCT -- 27.6* -- --  PLT -- 315 -- --  APTT -- -- -- --  LABPROT 36.4* -- 40.4* 37.6*  INR 3.59* -- 4.11* 3.75*  HEPARINUNFRC -- -- -- --  CREATININE -- 8.99* -- --  CKTOTAL -- -- -- --  CKMB -- -- -- --  TROPONINI -- -- -- --    Estimated Creatinine Clearance: 8.6 ml/min (by C-G formula based on Cr of 8.99).  Assessment: 78 yo male with h/o Afib/flutter on chronic coumadin prior to recent hospitalization on 07/16/11 for ischemic left leg.  His coumadin dose PTA was 2.5mg  every MWFSat and 5mg  every TTSun.   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  His coumadin was resumed on 7/13 (MD dosing) with Lovenox 30mg  SQ q24h bridge (stopped 7/16). Today the INR is supratherapeutic but decreasing with no coumadin given x 3 days.  Spoke with RN who reported no evidence of bleeding.  No new drug-drug interactions noted to be potential cause of increased INR.  Meal intake consistently 85-100%.  Goal of Therapy:  INR 2-3   Plan:  1. Resume Coumadin cautiously, 1mg  po x1 today 2. F/u AM INR  Jill Side L. Illene Bolus, PharmD, BCPS Clinical Pharmacist Pager: [phone] 08/01/2011 9:57 AM

## 2011-08-01 NOTE — Progress Notes (Signed)
Social Work Patient ID: [name], male   DOB: [birth year], 78 y.o.   MRN: [id number]  Met with patient and wife today to review team conference.  Pt not agreeable with targeted d/c date 8/1 and states, "... i'm just going to go to the [place]".  Attempted to explain to both the rationale for this date and that he has potential to make progress which would lessen burden on wife.  Pt then begins to complain about "... those people drinking liquor all night next door..." and required redirection.  Wife became frustrated with patient and stated, "you know that when you get home you're just gonna sit in that recliner all day...you don't care about nothing!".  Attempted to calm them both and redirect back to reasons for team recommendation, however, they continued to argue with one another so I stated that I would follow up with them at a later time and allow them to continue discussion about d/c.  Have contacted pt's son to review all this information and he is "not surprised" by pt's behavior.  Expresses some concern about whether pt is having hallucinations because of his pain med - reports pt has expressed paranoid ideas to him i.e. Insisting people are calling him on the phone to talk about his therapy, that the fire detector is a spy monitor.  Son plans to speak with his father tomorrow morning and attempt to rationalize with him and encourage him to participate and to stay the recommended LOS. I will follow up with son after that.  Bretta Bang, therapy supervisor, also to speak with pt this afternoon as well.   Son requests that I have MD recommend in writing that pt have 24/7 assist and that they have an in-home aide.  I told him that this will not guarantee any insurance coverage for an aide and he reports he just wants the documentation and then he "will try" to get coverage on his own if possible.   Will continue to follow with this difficult case until [facility]'s return next week. :)  Jacorie Ernsberger,  Jaxtin Raimondo

## 2011-08-01 NOTE — Patient Care Conference (Signed)
Inpatient RehabilitationTeam Conference Note Date: 08/01/2011   Time: 11:00 AM    Patient Name: Alan [name]      Medical Record Number: [id number]  Date of Birth: 01/29/1931 Sex: Male         Room/Bed: 4039/4039-01 Payor Info: Payor: MEDICARE RAILROAD  Plan: MEDICARE RAILROAD  Product Type: *No Product type*     Admitting Diagnosis: LT BKA  Admit Date/Time:  07/24/2011  4:11 PM Admission Comments: No comment available   Primary Diagnosis:  Unilateral complete BKA Principal Problem: Unilateral complete BKA  Patient Active Problem List   Diagnosis Date Noted  . Unilateral complete BKA-right 07/25/2011  . Atherosclerosis of native arteries of the extremities with ulceration 06/18/2011  . Bacteremia due to Gram-positive bacteria 05/04/2011  . Dehydration 05/04/2011  . Diabetes mellitus 05/04/2011  . Chronic anticoagulation 05/04/2011  . Hypotension 05/04/2011  . Sepsis 05/03/2011  . Healthcare-associated pneumonia 05/03/2011  . Toxic metabolic encephalopathy 05/03/2011  . Aortic stenosis 11/22/2010  . CAD (coronary artery disease) 11/22/2010  . End stage renal disease on dialysis 11/22/2010  . [place]-induced atrial fibrillation 11/16/2010  . Encounter for long-term (current) use of anticoagulants 11/16/2010    Expected Discharge Date: Expected Discharge Date: 08/09/11 (after HD)  Team Members Present: Physician: Dr. Claudette Laws Case Manager Present: Melanee Spry, RN Social Worker Present: Amada Jupiter, LCSW Nurse Present: Other (comment) Milly Jakob Rcom, RN) PT Present: Vincente Liberty, PTA;Edman Circle, PT OT Present: Leonette Monarch, OT SLP Present: Fae Pippin, SLP Other (Discipline and Name): Tora Duck, PPS Coordinator     Current Status/Progress Goal Weekly Team Focus  Medical   left BKA, poor endurance, end-stage renal disease  improved endurance, manage wound  encourage full participation in therapy   Bowel/Bladder   Patient continent of bowel and bladder.  LBM  7/23 after sorbitol and enema  remain continent of bowel and bladder.  monitor; administer stool softener as scheduled per MD order.   Swallow/Nutrition/ Hydration             ADL's   max toileting, mod LB dressing, min bathing, min assist scoot transfer to [facility]  supervision/set up bed level for LB dressing, bathing, toilet transfer BSC; mod assist toileting for clothing management  ADL and functional mobility retraining, strengthening, limb wrapping, pt/family education   Mobility   min assist slideboard transfer and w/c mobility x 60'; max assist sit> stand in parallel bars  supervision transfers and w/c mobility, min assist gait x 10'---downgraded  participation in therapy, transfers, w/c mobilty, pre-gait   Communication             Safety/Cognition/ Behavioral Observations            Pain   Patient complain of L stump pain "6" on scale 0-10.    2 or less on scale 0-10.  Monitor, reassess the effectiveness of pain med.   Skin   Staples to L BKA with kerlex and Ace wrap, no drainage;  Applied silverdene and dry dsg to R third toe and R ankle  Healing of blister and continue to change dsg, apply medication.    Monitor for s/s of infection, keep the site clean.      *See Interdisciplinary Assessment and Plan and progress notes for long and short-term goals  Barriers to Discharge: patient complains of fatigue, refusing physical therapy at times    Possible Resolutions to Barriers:  encourage full participation, discussed option of nursing home    Discharge Planning/Teaching Needs:  Plan for  pt to d/c home with wife and step-son providing 24/7 assist.  Slow progress and participation - discussion ongoing with pt, wife and team to maximize therapy when he can.      Team Discussion:  Pt still with poor participation and c/o fatigue after lengthy HD.  Limited progress and need to target d/c next week.  Concerns that pt will not agree to LOS and that wife will have difficulty managing pt  at home.  SW  To f/u with pt, wife and son to discuss.  Revisions to Treatment Plan:  None   Continued Need for Acute Rehabilitation Level of Care: The patient requires daily medical management by a physician with specialized training in physical medicine and rehabilitation for the following conditions: Daily direction of a multidisciplinary physical rehabilitation program to ensure safe treatment while eliciting the highest outcome that is of practical value to the patient.: Yes Daily medical management of patient stability for increased activity during participation in an intensive rehabilitation regime.: Yes Daily analysis of laboratory values and/or radiology reports with any subsequent need for medication adjustment of medical intervention for : Neurological problems  Alan [name] 08/01/2011, 3:43 PM

## 2011-08-01 NOTE — Progress Notes (Signed)
Occupational Therapy Weekly Progress Note  Patient Details  Name: Alan [name] MRN: [id number] Date of Birth: 1931-05-04  Today's Date: 08/01/2011 Time: 1000-1100 Time Calculation (min): 60 min  1:1 Pt scheduled this am for B/D, but pt declined.  His son was in room and stated that he had just assisted his father to the bathroom with help from nursing. His son stated that w/c will not fit in bathroom at home.  Pt will need to use drop arm BSC and sponge bathe.    Pt seen this am for SB transfer training to mat (assist to steady chair), weight shifting on mat for lateral leans, UE strengthening for trunk extension and elbow extension needed for transfers.   Pt worked on sit to partial stand in parallel bars by pulling up on bars. Stand tolerance 5 seconds at a time.  Patient has met 1 of 4 short term goals.  He had limited participation this past week due to medical issues.  Therapy plan adjusted to 15 hours over 7 days.  Patient continues to demonstrate the following deficits: decreased activity tolerance and UE and trunk strength required for weight shifting and lateral leans and therefore will continue to benefit from skilled OT intervention to enhance overall performance with BADL and Reduce care partner burden.  Patient not progressing toward long term goals.  See goal revision..  Plan of care revisions: Pt's LTGs modified 07/31/11 to supervision versus mod I with bathing and dressing and mod assist with toilteting..  OT Short Term Goals Week 1:  OT Short Term Goal 1 (Week 1): Pt will transfer to [facility] with steady assist. OT Short Term Goal 1 - Progress (Week 1): Met OT Short Term Goal 2 (Week 1): Pt will be able to pull pants down for toileting with min assist. OT Short Term Goal 2 - Progress (Week 1): Progressing toward goal OT Short Term Goal 3 (Week 1): Pt will be able to pull pants up with mod assist. OT Short Term Goal 3 - Progress (Week 1): Progressing toward goal OT Short Term  Goal 4 (Week 1): Pt will bathe with supervision. OT Short Term Goal 4 - Progress (Week 1): Progressing toward goal Week 2:  OT Short Term Goal 1 (Week 2): Pt will transfer to [facility] with supervision. OT Short Term Goal 2 (Week 2): Pt will be able to remove pants for toileting with min assist. OT Short Term Goal 3 (Week 2): Pt will be able to pull up pants post toileting with mod assist. OT Short Term Goal 4 (Week 2): Pt will be able to don clothing from bed level.  Skilled Therapeutic Interventions/Progress Updates:  Financial controller;Functional mobility training;Patient/family education;Self Care/advanced ADL retraining;Therapeutic Activities;Therapeutic Exercise;UE/LE Strength taining/ROM   Therapy Documentation Precautions:  Precautions Precautions: Fall Restrictions Weight Bearing Restrictions: Yes LLE Weight Bearing: Non weight bearing  Pain: Pain Assessment Pain Assessment: No/denies pain Pain Score: 0-No pain  See FIM for current functional status  Therapy/Group: Individual Therapy  Suezette Lafave 08/01/2011, 12:23 PM

## 2011-08-01 NOTE — Progress Notes (Signed)
Physical Therapy Weekly Progress Note  Patient Details  Name: Alan [name] MRN: [id number] Date of Birth: [birth year]  Today's Date: 08/01/2011 15:30-16:30 Time: 60 min Patient has met 2 of 9 long term goals.  Short term goals not set due to estimated length of stay.  Family education has been initiated with wife for sliding board tranfers to bed and car with supervision and assist for wc set up. Gait, standing, and community wc goals discharged due to decreased consistency with participation with fatigue from dialysis schedule.  Patient continues to demonstrate the following deficits: generalized weakness, decreased balance, decreased activity tolerance, pain L residual limb; he therefore will continue to benefit from skilled PT intervention to enhance overall performance with activity tolerance, postural control and ability to compensate for deficits.  See Patient's Care Plan for progression toward long term goals.  Patient not progressing toward long term goals.  See goal revision..  Plan of care revisions: goal changes.  Skilled Therapeutic Interventions/Progress Updates: Pt demonstrates difficulty generating enough power through RLE to elevate hips, as well as pivoting on R foot, for squat/pivot transfer.  Wife is anxious for pt to perform stand/pivot transfers, but he is unable at this time.       Therapy Documentation Precautions:  Precautions Precautions: Fall Required Braces or Orthoses: Other Brace/Splint Other Brace/Splint: PRAFO rt foot Restrictions Weight Bearing Restrictions: Yes LLE Weight Bearing: Non weight bearing General: Amount of Missed PT Time (min): 5 Minutes (Pt speaking with MD) Missed Time Reason: Other (comment) (Pt speaking with MD)   Pain: Pain Assessment Pain Assessment: No/denies pain Mobility: Bed Mobility Rolling Left: 4: Min assist Left Sidelying to Sit: 4: Min guard Sit to Supine: 5: Supervision Locomotion : Ambulation Ambulation: No        Other Treatments: car transfer with sliding board x 1 with therapist and x 1 with wife supervision level with set up and stabalizing wc.    Accessibility of BR to w/c, is questionable; awaiting measurements from family. Also, wife states bed is very high, making SB transfers difficult.  Awaiting measurements.  SW following up with son regarding need for ramp for home accessibility.   See FIM for current functional status  Therapy/Group: Individual Therapy  Julian Reil 08/01/2011, 4:48 PM

## 2011-08-01 NOTE — Significant Event (Signed)
Late entry: Pt sustained a unassisted fall. Was found sitting on floor at foot of bed. Pt stated that he was trying to get to wheelchair. Side rails up x 3, with additional rail up on right prior to fall. Pt denied any head injury. Assisted back to bed by use of lift. Neuo, VS and physical assessment WNL. Dr. Amador Cunas notified, no new orders at this time.

## 2011-08-01 NOTE — Progress Notes (Signed)
Patient ID: Alan [name], male   DOB: [birth year], 78 y.o.   MRN: [id number] Subjective/Complaints: Continues to complain about duration of dialysis. Back pain and weakness after dialysis. Wants to go home "My wife and I can do this at home"  Team conference see note  A 12 point review of systems has been performed and if not noted above is otherwise negative.   Objective: Vital Signs: Blood pressure 92/56, pulse 79, temperature 98.3 F (36.8 C), temperature source Oral, resp. rate 18, height 6' (1.829 m), weight 104.1 kg (229 lb 8 oz), SpO2 98.00%. No results found.  Basename 07/31/11 1611  WBC 8.8  HGB 8.9*  HCT 27.6*  PLT 315    Basename 07/31/11 1611  NA 130*  K 3.7  CL 91*  CO2 23  GLUCOSE 200*  BUN 45*  CREATININE 8.99*  CALCIUM 8.3*   CBG (last 3)   Basename 08/01/11 0716 07/31/11 2120 07/31/11 1112  GLUCAP 82 131* 172*    Wt Readings from Last 3 Encounters:  08/01/11 104.1 kg (229 lb 8 oz)  07/24/11 103.3 kg (227 lb 11.8 oz)  07/24/11 103.3 kg (227 lb 11.8 oz)    Physical Exam:   General:   oriented x 3, No apparent distress--appears fatigued HEENT: Head is normocephalic, atraumatic, PERRLA, EOMI, sclera anicteric, oral mucosa pink and moist, dentition intact, ext ear canals clear,  Neck: Supple without JVD or lymphadenopathy Heart: IrregReg rate and rhythm. SE murmurs. No  rubs or gallops Chest: CTA bilaterally without wheezes, rales, or rhonchi; no distress Abdomen: Soft, non-tender, non-distended, bowel sounds positive. Extremities: No clubbing, cyanosis, or edema. Pulses are 2+. Dry denuded blisters on 3rd and 4th toes.  Dry ulcer on lateral malleolus.  L-BKA with sutures intact and minimal dry blood on dressing. blanchable erythema with decrease in edema.   Bruising resolving.  Skin: BK is intact with staples ,small amt serosanguinous drainage. Some hyperemia and bruising around the wound but area does not appear infected/ is not warm-  appropriately tender. Right third toe blister dry and lateral malleolus ulcer clean and dry. Neuro: Pt is cognitively appropriate with normal insight, memory, and awareness. Cranial nerves 2-12 are intact. Sensory exam is normal except for distal extremities. Reflexes are 2+ in all 4's. Fine motor coordination is intact. No tremors. Motor function is grossly 5/5 except for LLE which is 3/5.  Musculoskeletal: Full ROM, No pain with AROM or PROM in the neck, trunk, or extremities. Posture appropriate Psych: Pt's affect is appropriate. Pt is cooperative    Assessment/Plan: 1. Functional deficits secondary to left BKA which require 3+ hours per day of interdisciplinary therapy in a comprehensive inpatient rehab setting. Physiatrist is providing close team supervision and 24 hour management of active medical problems listed below. Physiatrist and rehab team continue to assess barriers to discharge/monitor patient progress toward functional and medical goals. FIM: FIM - Bathing Bathing Steps Patient Completed: Right Arm;Left Arm;Abdomen;Front perineal area;Chest (wife did bath) Bathing: 3: Mod-Patient completes 5-7 68f 10 parts or 50-74%  FIM - Upper Body Dressing/Undressing Upper body dressing/undressing steps patient completed: Pull shirt over trunk Upper body dressing/undressing: 5: Set-up assist to: Obtain clothing/put away FIM - Lower Body Dressing/Undressing Lower body dressing/undressing steps patient completed: Thread/unthread right underwear leg;Thread/unthread left underwear leg Lower body dressing/undressing: 3: Mod-Patient completed 50-74% of tasks  FIM - Toileting Toileting steps completed by patient: Performs perineal hygiene Toileting Assistive Devices: Grab bar or rail for support Toileting: 2: Max-Patient completed 1 of 3  steps  FIM - Diplomatic Services operational officer Devices: Bedside commode Toilet Transfers: 5-To toilet/BSC: Supervision (verbal cues/safety  issues);4-From toilet/BSC: Min A (steadying Pt. > 75%)  FIM - Banker Devices: Sliding board Bed/Chair Transfer: 4: Bed > Chair or W/C: Min A (steadying Pt. > 75%)  FIM - Locomotion: Wheelchair Locomotion: Wheelchair: 2: Travels 50 - 149 ft with minimal assistance (Pt.>75%) FIM - Locomotion: Ambulation Locomotion: Ambulation: 0: Activity did not occur  Comprehension Comprehension Mode: Auditory Comprehension: 5-Understands basic 90% of the time/requires cueing < 10% of the time  Expression Expression Mode: Verbal Expression: 6-Expresses complex ideas: With extra time/assistive device  Social Interaction Social Interaction: 6-Interacts appropriately with others with medication or extra time (anti-anxiety, antidepressant).  Problem Solving Problem Solving: 5-Solves basic problems: With no assist  Memory Memory: 4-Recognizes or recalls 75 - 89% of the time/requires cueing 10 - 24% of the time Medical Problem List and Plan:  1. DVT Prophylaxis/Anticoagulation: Pharmaceutical: Coumadin  2. Pain Management: Will schedule pain medication prior to therapy schedule. Discussed pain mgt in the setting of a BKA with this patient 3. Mood:  Ego support provided. Continue to monitor.  4. Neuropsych: This patient is capable of making decisions on his/her own behalf.  5. ESRD: Nephrology to assist with HD needs. Continue 3 X week.  7. Acute on chronic anemia: Routine labs with HD. Continue aranesp weekly. Drop in H/H to 8.5. Monitor for  further symptoms--may need transfusion.    8. Right toe ulcer/woundcare R Lat malleolus decubitus ulcer: continue silvadene cream daily. PRAFO for protection when in bed.   -left BKA clean and intact- watch for increase in erythema 9. HTN: monitor with BID checks. Continue lopressor-hold today due to low blood pressure.  10. Dyslipidemia: continue vytorin.  11. DM type 2 with severe neuropathy: Monitor BS with bid  checks-seems to be reasonably controlled at present. Continue Amaryl daily. SSI 12. Constipation:  suppository prn.  13. A Fib: Continue to check HR on bid basis. Continue lopressor. Continue chronic coumadin.  14. CAD with moderate to severe aortic stenosis: monitor for symptoms with increase in activity levels. Continue lopressor, vytorin and coumadin, no signs of failure   LOS (Days) 8 A FACE TO FACE EVALUATION WAS PERFORMED   08/01/2011, 8:26 AM

## 2011-08-01 NOTE — Progress Notes (Signed)
Orthopedic Tech Progress Note Patient Details:  Alan [name] [birth year] [id number]  Patient ID: Alan [name], male   DOB: 1931/03/13, 78 y.o.   MRN: [id number]   [name] 08/01/2011, 9:31 AM CALLED BIO TECH FOR RIGHT PRAFO BRACE

## 2011-08-01 NOTE — Progress Notes (Signed)
Subjective:  Sitting in recliner with wife @ bedside; Upset voicing multiple complaints and concerns including length of rehab; PT/OT exercises (felt exertional); length and lateness of HD treatments   Vital signs in last 24 hours: Filed Vitals:   07/31/11 2130 08/01/11 0700 08/01/11 0757 08/01/11 0915  BP: 128/75 92/56  104/47  Pulse: 82 79  62  Temp: 97.9 F (36.6 C) 98.3 F (36.8 C)    TempSrc: Oral Oral    Resp: 18 18    Height:      Weight:  105.2 kg (231 lb 14.8 oz) 104.1 kg (229 lb 8 oz)   SpO2: 93% 98%     Weight change: 6.1 kg (13 lb 7.2 oz)  Intake/Output Summary (Last 24 hours) at 08/01/11 1249 Last data filed at 08/01/11 0902  Gross per 24 hour  Intake    600 ml  Output   3003 ml  Net  -2403 ml   Labs: Basic Metabolic Panel:  Lab 07/31/11 9604 07/26/11 1458  NA 130* 133*  K 3.7 4.0  CL 91* 93*  CO2 23 24  GLUCOSE 200* 165*  BUN 45* 45*  CREATININE 8.99* 8.54*  CALCIUM 8.3* 9.0  ALB -- --  PHOS 4.3 3.9   Liver Function Tests:  Lab 07/31/11 1611 07/26/11 1458  AST -- --  ALT -- --  ALKPHOS -- --  BILITOT -- --  PROT -- --  ALBUMIN 2.6* 2.8*   No results found for this basename: LIPASE:3,AMYLASE:3 in the last 168 hours No results found for this basename: AMMONIA:3 in the last 168 hours CBC:  Lab 07/31/11 1611 07/26/11 1458  WBC 8.8 9.3  NEUTROABS -- --  HGB 8.9* 8.5*  HCT 27.6* 26.7*  MCV 91.1 90.5  PLT 315 291   Cardiac Enzymes: No results found for this basename: CKTOTAL:5,CKMB:5,CKMBINDEX:5,TROPONINI:5 in the last 168 hours CBG:  Lab 08/01/11 1133 08/01/11 0716 07/31/11 2120 07/31/11 1112 07/31/11 0723  GLUCAP 193* 82 131* 172* 120*    Iron Studies: No results found for this basename: IRON,TIBC,TRANSFERRIN,FERRITIN in the last 72 hours Studies/Results: No results found. Medications:      . allopurinol  150 mg Oral Daily  . aspirin EC  81 mg Oral Daily  . bacitracin   Topical Daily  . calcium carbonate  1 tablet Oral TID AC    . darbepoetin (ARANESP) injection - DIALYSIS  150 mcg Intravenous Q Sat-HD  . ezetimibe-simvastatin  1 tablet Oral QHS  . feeding supplement  1 Container Oral Q24H  . ferric gluconate (FERRLECIT/NULECIT) IV  62.5 mg Intravenous Q Thu-HD  . glimepiride  2 mg Oral QAC breakfast  . HYDROcodone-acetaminophen  1-2 tablet Oral BID  . hydrocortisone  25 mg Rectal BID  . insulin aspart  0-15 Units Subcutaneous TID WC  . metoprolol tartrate  37.5 mg Oral BID  . pantoprazole  40 mg Oral Q1200  . paricalcitol  4 mcg Intravenous Q T,Th,Sa-HD  . senna-docusate  2 tablet Oral BID  . warfarin  1 mg Oral ONCE-1800  . Warfarin - Pharmacist Dosing Inpatient   Does not apply q1800    I  have reviewed scheduled and prn medications.  Physical Exam  General: comfortable, sitting up in wheelchair, NAD  Heart: RRR II/VI murmur  Lungs: slightly diminished bases, no rhonchi, wheezes or rales  Abdomen:obese soft, obese, NT/ND, +BS  Extremities: left BKA with ace dressing; no right LE edema  Dialysis Access: right upper AVF +T/B   Dialysis Prescription:  4 1/4hr, EDW 108.5, QB 500, QD AF 1.5, 2K/2.25Ca. RUA AVF 15G needles. Heparin 3400 Units. Zemplar TIW, No epogen. Venofer 100mg  IV Q 2weekly.   Assessment/Plan  1. PVD with ulceration- S/P Left BKA, 07/21/11 [facility]- POD #11. on Rehab with PT/OT.  2. ESRD: HD TTS [facility]; next Tx tomorrow; Will need new EDW at time of discharge; K level 3.7 yesterday; replete with HD; check labs pre HD  3. Anemia: hgb ^8.9; cont Aranesp (restarted 07/21/11); same wkly IV Fe; follow trend 4. Metabolic Bone disease- Ca 8.3, phos 4.3; controlled on CaCO3 and Zemplar; follow ca/phos 5. Hypertension- controlled on meds (metoprolol tartrate 37.5 bid) and UF with HD 6. DM/HLD- Vytorin, Amaryl; CBG and SSI; HgbA1c 6.3%; glucose 82 this am; primary following 7. Nutrition- albumin 2.6; ^ protein renal carb modified diet; suppl prn 8. Afib/flutter-on Coumadin with INR 3.59  today; no active bleeding; pharmacy following 9. Hemorrhoids - improving on anusol HC - daily miralax 10. Gout- no problems on allopurinol 11. Disposition- per rehab with anticipated discharge next Thu 08/09/11 after HD; same oupt HD (TTS) schedule    [name], FNP-C [facility] Pager [phone]  08/01/2011,12:49 PM  LOS: 8 days       Salihah Peckham C

## 2011-08-01 NOTE — Progress Notes (Signed)
Late entry  I spoke with Alan [name] this morning as he was upset at the use of a quick release belt.  He had a documented fall from late Sunday night/Early Sunday morning and he has been observed transferring himself in his room several times this morning and is unsteady when doing so.  For his safety plan the decision was made by his primary nurse to add the quick release belt.  He is adamantly refusing the belt and stated that he was calling his wife to come and sign him out of the hospital.  I attempted to discuss the safety plan with him at this time and he became angrier and stated he did not wish to speak with me anymore.  The quick release was removed and his NT Enrique Sack and Nurse Milly Jakob are at the bedside speaking with them.  I will follow up with him later this afternoon

## 2011-08-02 ENCOUNTER — Inpatient Hospital Stay (HOSPITAL_COMMUNITY): Payer: MEDICARE

## 2011-08-02 ENCOUNTER — Encounter (HOSPITAL_COMMUNITY): Payer: MEDICARE | Admitting: Occupational Therapy

## 2011-08-02 LAB — CBC
HCT: 28.2 % — ABNORMAL LOW (ref 39.0–52.0)
MCH: 29.1 pg (ref 26.0–34.0)
MCHC: 31.9 g/dL (ref 30.0–36.0)
RDW: 15.2 % (ref 11.5–15.5)

## 2011-08-02 LAB — RENAL FUNCTION PANEL
Albumin: 2.8 g/dL — ABNORMAL LOW (ref 3.5–5.2)
BUN: 36 mg/dL — ABNORMAL HIGH (ref 6–23)
Calcium: 8.7 mg/dL (ref 8.4–10.5)
Creatinine, Ser: 8.42 mg/dL — ABNORMAL HIGH (ref 0.50–1.35)
Phosphorus: 4.5 mg/dL (ref 2.3–4.6)
Potassium: 4.1 mEq/L (ref 3.5–5.1)

## 2011-08-02 LAB — GLUCOSE, CAPILLARY: Glucose-Capillary: 158 mg/dL — ABNORMAL HIGH (ref 70–99)

## 2011-08-02 LAB — PROTIME-INR: Prothrombin Time: 35.4 seconds — ABNORMAL HIGH (ref 11.6–15.2)

## 2011-08-02 MED ORDER — OXYCODONE HCL 5 MG PO TABS
5.0000 mg | ORAL_TABLET | ORAL | Status: DC | PRN
  Administered 2011-08-04 – 2011-08-09 (×4): 5 mg via ORAL
  Filled 2011-08-02 (×6): qty 1

## 2011-08-02 MED ORDER — METOPROLOL TARTRATE 12.5 MG HALF TABLET
12.5000 mg | ORAL_TABLET | Freq: Two times a day (BID) | ORAL | Status: DC
  Administered 2011-08-02 – 2011-08-09 (×7): 12.5 mg via ORAL
  Filled 2011-08-02 (×16): qty 1

## 2011-08-02 MED ORDER — GABAPENTIN 100 MG PO CAPS
100.0000 mg | ORAL_CAPSULE | Freq: Every day | ORAL | Status: DC
  Administered 2011-08-02 – 2011-08-08 (×7): 100 mg via ORAL
  Filled 2011-08-02 (×8): qty 1

## 2011-08-02 MED ORDER — PARICALCITOL 5 MCG/ML IV SOLN
INTRAVENOUS | Status: AC
  Administered 2011-08-02: 4 ug via INTRAVENOUS
  Filled 2011-08-02: qty 1

## 2011-08-02 MED ORDER — CEPHALEXIN 250 MG PO CAPS: 250.0000 mg | ORAL_CAPSULE | Freq: Three times a day (TID) | ORAL | Status: DC

## 2011-08-02 MED ORDER — DOXYCYCLINE HYCLATE 100 MG PO TABS
100.0000 mg | ORAL_TABLET | Freq: Two times a day (BID) | ORAL | Status: DC
  Administered 2011-08-02 – 2011-08-08 (×13): 100 mg via ORAL
  Filled 2011-08-02 (×15): qty 1

## 2011-08-02 MED ORDER — OXYCODONE HCL 5 MG PO TABS
5.0000 mg | ORAL_TABLET | Freq: Two times a day (BID) | ORAL | Status: DC
  Administered 2011-08-02 – 2011-08-09 (×14): 5 mg via ORAL
  Filled 2011-08-02 (×12): qty 1

## 2011-08-02 NOTE — Progress Notes (Signed)
Subjective:   Sitting in chair, currently comfortable with no complaints.  Objective: Vital signs in last 24 hours: Temp:  [97.9 F (36.6 C)-98.2 F (36.8 C)] 97.9 F (36.6 C) (07/25 0548) Pulse Rate:  [62-80] 80  (07/25 0548) Resp:  [18-19] 18  (07/25 0548) BP: (104-109)/(47-67) 109/66 mmHg (07/25 0548) SpO2:  [95 %-99 %] 95 % (07/25 0548) Weight:  [105.7 kg (233 lb 0.4 oz)] 105.7 kg (233 lb 0.4 oz) (07/25 0548) Weight change: -4.7 kg (-10 lb 5.8 oz)  Intake/Output from previous day: 07/24 0701 - 07/25 0700 In: 720 [P.O.:720] Out: -  Intake/Output this shift: Total I/O In: 240 [P.O.:240] Out: -  EXAM: General appearance:  Alert, comfortable, in no apparent distress Resp:  CTA without rales, rhonchi, or wheezes Cardio:  RRR with Gr II/VI systolic murmur GI:  + BS, soft and nontender Liver down 4 cm , obese, Extremities:  No edema on right, left stump with intact staples, mild erythema and nonpitting swelling Access:  AVF @ RUA with + bruit  Lab Results:  Basename 07/31/11 1611  WBC 8.8  HGB 8.9*  HCT 27.6*  PLT 315   BMET:  Basename 07/31/11 1611  NA 130*  K 3.7  CL 91*  CO2 23  GLUCOSE 200*  BUN 45*  CREATININE 8.99*  CALCIUM 8.3*  ALBUMIN 2.6*   No results found for this basename: PTH:2 in the last 72 hours Iron Studies: No results found for this basename: IRON,TIBC,TRANSFERRIN,FERRITIN in the last 72 hours  Assessment/Plan: 1. PVD with ulceration - S/P Left BKA 07/21/11 per [name], now on Rehab with PT/OT.  2. ESRD - HD on TTS @ [facility]; last K 3.7.   HD today. BP low, lower meds. 3. Anemia - Hgb 8.9 on 7/23; Aranesp restarted 07/21/11 (Sat); previously on Venofer 100mg  q2wk; resumed by giving 50mg  qwk (Thurs); CBC pending pre-HD; follow trend.Now on Nulecit Q tx 4. Metabolic Bone disease - Ca 8.3, P 4.3; controlled on CaCO3 and Zemplar 4 mcg; follow ca/phos. 5. Hypertension - controlled (109/66) on Metoprolol 37.5 bid and UF with HD; current wt  105.7 kg (previous EDW 108.5) with UF goal of 2-3 L today. 6. DM/HLD - Vytorin, Amaryl; CBG and SSI; HgbA1c 6.3%; primary following.Fair control 7. Nutrition - albumin low at 2.6; ^ protein renal carb modified diet; supple prn. 8. Afib/flutter - on Coumadin with INR 3.47 today; no active bleeding; pharmacy following. 9. Hemorrhoids - improving on anusol HC, daily miralax. 10. Gout - no problems, on allopurinol. 11. Disposition - per rehab; same oupt HD (TTS) schedule.   LOS: 9 days   LYLES,CHARLES 08/02/2011,8:21 AM

## 2011-08-02 NOTE — Progress Notes (Signed)
Patient ID: Alan [name], male   DOB: 11-15-1931, 77 y.o.   MRN: [id number] BP running low. Will lower Lopressor.  May need to D/C and use Amio if has Afib

## 2011-08-02 NOTE — Progress Notes (Signed)
Physical Therapy Session Note  Patient Details  Name: Alan [name] MRN: [id number] Date of Birth: July 06, 1931  Today's Date: 08/02/2011 Time: 1315-1415 (60 min)  Short Term Goals: Week 1:  PT Short Term Goal 1 (Week 1): Same as LTGs  Skilled Therapeutic Interventions/Progress Updates:    Pt participated in LE strengthening group exercise. Pt self-propelled w/c >150' for UE endurance and strenthening. Completed 3x10 bilateral hip flex, hip abd, hip ext, hip add and hip ext stretching, SAQ and knee flex in prone for improved LE strengthening, LE endurance, and activity tolerance.    Therapy Documentation Precautions:  Precautions Precautions: Fall Required Braces or Orthoses: Other Brace/Splint Other Brace/Splint: PRAFO rt foot Restrictions Weight Bearing Restrictions: Yes LLE Weight Bearing: Non weight bearing  Pain: 3/10 pain in residual limb- pt premedicated.  See FIM for current functional status  Therapy/Group: Group Therapy  Deirdre Pippins 08/02/2011, 2:03 PM

## 2011-08-02 NOTE — Progress Notes (Signed)
Reviewed and in agreement with treatment provided.  

## 2011-08-02 NOTE — Progress Notes (Signed)
Nursing Note: Talked w/ pt and explained that our goal was to get him ready to go home safely  and without injury.Explained to pt that our safety plan was not a punitive measure but only to help [place] take good care of him and keep him safe. Explained to pt that ultimately ,he needs to be able to transfer safely so that he can decrease his risk of falls at home and move safely.Talked with pt after he became angry that the NT asked him if he was ready for bed since he was sitting up in his wheelchair asleep.Reluctant to awaken pt due to his irritability on previous shift.wbb

## 2011-08-02 NOTE — Progress Notes (Signed)
Physical Therapy Session Note  Patient Details  Name: Alan [name] MRN: [id number] Date of Birth: Apr 30, 1931  Today's Date: 08/02/2011 Time: 1035-1100 Time Calculation (min): 25 min  Short Term Goals: Week 1:  PT Short Term Goal 1 (Week 1): Same as LTGs  Skilled Therapeutic Interventions/Progress Updates:   Wife present and observed level transfer w/c>< bed in ADL apartment. Pt refused to use SB.  Discussed home situation regarding bed height, bedroom set-up for accessibility to w/c; pt and wife stated that bed is very high; she will measure.  Therapist stressed to pt and wife that transfer into bed will be difficult if it is several inches higher than w/c, unless pt can perform a stand/pivot transfer.    Pt performed squat/pivot transfer to R with min assist.  Bed mobility supervision.    Attempted sit> stand from regular bed, 23" high.  Pt unable with max assist.  Bed> w/c to L with min assist with safety concern of pt rubbing over wheel due to poor elevation of hips.  Wife reported that son is looking into community assistance for having ramp built.  Discussed d/c with SW; she will follow up with son for home accessibility and need for ramp.      Therapy Documentation Precautions:  Precautions Precautions: Fall Required Braces or Orthoses: Other Brace/Splint Other Brace/Splint: PRAFO rt foot Restrictions Weight Bearing Restrictions: Yes LLE Weight Bearing: Non weight bearing Pain: Pain Assessment Pain Assessment: 4/10 L residual limb; see meds     See FIM for current functional status  Therapy/Group: Individual Therapy  Marymargaret Kirker 08/02/2011, 4:49 PM

## 2011-08-02 NOTE — Progress Notes (Signed)
Occupational Therapy Session Note  Patient Details  Name: Alan [name] MRN: [id number] Date of Birth: 11/27/1931  Today's Date: 08/02/2011 Time: 0900-1000 Time Calculation (min): 60 min  Short Term Goals: Week 1:  OT Short Term Goal 1 (Week 1): Pt will transfer to [facility] with steady assist. OT Short Term Goal 1 - Progress (Week 1): Met OT Short Term Goal 2 (Week 1): Pt will be able to pull pants down for toileting with min assist. OT Short Term Goal 2 - Progress (Week 1): Progressing toward goal OT Short Term Goal 3 (Week 1): Pt will be able to pull pants up with mod assist. OT Short Term Goal 3 - Progress (Week 1): Progressing toward goal OT Short Term Goal 4 (Week 1): Pt will bathe with supervision. OT Short Term Goal 4 - Progress (Week 1): Progressing toward goal Week 2:  OT Short Term Goal 1 (Week 2): Pt will transfer to [facility] with supervision. OT Short Term Goal 2 (Week 2): Pt will be able to remove pants for toileting with min assist. OT Short Term Goal 3 (Week 2): Pt will be able to pull up pants post toileting with mod assist. OT Short Term Goal 4 (Week 2): Pt will be able to don clothing from bed level.  Skilled Therapeutic Interventions/Progress Updates: Pt seen for B/D at wheelchair level as pt was in chair at start of OT session waiting for nurse to change his dressing.  Pt's wife present and initially he refused to work on self care skills stating he was already clean.  After a great deal of negotiating pt participated.  He was not clean as his pants were soiled. Family education with wife on how pt can perform toileting (clothing management) and LB dressing with lateral leans and/or pushing up from w/c.  Pt continues to need assist with clothing management.  Education on limb wrapping. Therapeutic exercise with lateral leans and UE AROM.  Pt was very agreeable to working on these exercises.     Therapy Documentation Precautions:  Precautions Precautions: Fall Required  Braces or Orthoses: Other Brace/Splint Other Brace/Splint: PRAFO rt foot Restrictions Weight Bearing Restrictions: Yes LLE Weight Bearing: Non weight bearing   Pain: Pain Assessment Pain Assessment: No/denies pain  See FIM for current functional status  Therapy/Group: Individual Therapy  Ledell Codrington 08/02/2011, 10:16 AM

## 2011-08-02 NOTE — Progress Notes (Signed)
ANTICOAGULATION CONSULT NOTE - Follow Up Consult  Pharmacy Consult for Coumadin Indication:   atrial fibrillation  Allergies  Allergen Reactions  . Ancef (Cefazolin) Rash    Patient Measurements: Height: 6' (182.9 cm) Weight: 233 lb 0.4 oz (105.7 kg) IBW/kg (Calculated) : 77.6    Vital Signs: Temp: 97.9 F (36.6 C) (07/25 0548) Temp src: Oral (07/25 0548) BP: 109/66 mmHg (07/25 0548) Pulse Rate: 80  (07/25 0548)  Labs:  Basename 08/02/11 0614 08/01/11 0600 07/31/11 1611 07/31/11 0654  HGB -- -- 8.9* --  HCT -- -- 27.6* --  PLT -- -- 315 --  APTT -- -- -- --  LABPROT 35.4* 36.4* -- 40.4*  INR 3.47* 3.59* -- 4.11*  HEPARINUNFRC -- -- -- --  CREATININE -- -- 8.99* --  CKTOTAL -- -- -- --  CKMB -- -- -- --  TROPONINI -- -- -- --    Estimated Creatinine Clearance: 8.6 ml/min (by C-G formula based on Cr of 8.99).  Assessment: 78 yo male with h/o Afib/flutter on chronic coumadin prior to recent hospitalization on 07/16/11 for ischemic left leg.  His coumadin dose PTA was 2.5mg  every MWFSat and 5mg  every TTSun.   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  His coumadin was resumed on 7/13 (MD dosing) with Lovenox 30mg  SQ q24h bridge (stopped 7/16).  Today the INR is supratherapeutic but decreasing with no coumadin given x 3 days. Minimal decrease in INR, noted 1mg  dose given 7/24. Spoke with RN who reported no evidence of bleeding.  No new drug-drug interactions noted to be potential cause of increased INR.  Meal intake consistently 85-100%.  Goal of Therapy:  INR 2-3   Plan:  - No Coumadin today - f/up daily INR  Larinda Herter K. Allena Katz, PharmD, BCPS.  Clinical Pharmacist Pager [phone]. 08/02/2011 9:03 AM

## 2011-08-02 NOTE — Procedures (Signed)
I was present at this session.  I have reviewed the session itself and made appropriate changes.  Bp marginal.  Using RUA AVF.  Imara Standiford L 7/25/20133:40 PM

## 2011-08-02 NOTE — Progress Notes (Signed)
Patient ID: Alan [name], male   DOB: [birth year], 78 y.o.   MRN: [id number] Subjective/Complaints: Pain in L stump. Doesn't like to take 2 pain pills, "it makes me feel funny"    A 12 point review of systems has been performed and if not noted above is otherwise negative.   Objective: Vital Signs: Blood pressure 109/66, pulse 80, temperature 97.9 F (36.6 C), temperature source Oral, resp. rate 18, height 6' (1.829 m), weight 105.7 kg (233 lb 0.4 oz), SpO2 95.00%. No results found.  Basename 07/31/11 1611  WBC 8.8  HGB 8.9*  HCT 27.6*  PLT 315    Basename 07/31/11 1611  NA 130*  K 3.7  CL 91*  CO2 23  GLUCOSE 200*  BUN 45*  CREATININE 8.99*  CALCIUM 8.3*   CBG (last 3)   Basename 08/02/11 0724 08/01/11 2054 08/01/11 1633  GLUCAP 158* 174* 139*    Wt Readings from Last 3 Encounters:  08/02/11 105.7 kg (233 lb 0.4 oz)  07/24/11 103.3 kg (227 lb 11.8 oz)  07/24/11 103.3 kg (227 lb 11.8 oz)    Physical Exam:   General:   oriented x 3, No apparent distress-- HEENT: Head is normocephalic, atraumatic, PERRLA, EOMI, sclera anicteric, oral mucosa pink and moist, dentition intact, ext ear canals clear,  Neck: Supple without JVD or lymphadenopathy Heart: IrregReg rate and rhythm. SE murmurs. No  rubs or gallops Chest: CTA bilaterally without wheezes, rales, or rhonchi; no distress Abdomen: Soft, non-tender, non-distended, bowel sounds positive. Extremities: No clubbing, cyanosis, or edema. Pulses are 2+. Dry denuded blisters on 3rd and 4th toes.  Dry ulcer on lateral malleolus.  L-BKA with sutures intact and minimal dry blood on dressing. blanchable erythema with decrease in edema.   Bruising resolving.  Skin: BK is intact with staples ,small amt serosanguinous drainage. Some hyperemia and bruising around the wound but area does not appear infected/ is not warm- appropriately tender. Right third toe blister dry and lateral malleolus ulcer clean and dry. Neuro: Pt is  cognitively appropriate with normal insight, memory, and awareness. Cranial nerves 2-12 are intact. Sensory exam is normal except for distal extremities. Reflexes are 2+ in all 4's. Fine motor coordination is intact. No tremors. Motor function is grossly 5/5 except for LLE which is 3/5.  Musculoskeletal: Full ROM, No pain with AROM or PROM in the neck, trunk, or extremities. Posture appropriate Psych: Pt's affect is appropriate. Pt is cooperative    Assessment/Plan: 1. Functional deficits secondary to left BKA which require 3+ hours per day of interdisciplinary therapy in a comprehensive inpatient rehab setting. Physiatrist is providing close team supervision and 24 hour management of active medical problems listed below. Physiatrist and rehab team continue to assess barriers to discharge/monitor patient progress toward functional and medical goals. FIM: FIM - Bathing Bathing Steps Patient Completed: Right Arm;Left Arm;Abdomen;Front perineal area;Chest (wife did bath) Bathing: 3: Mod-Patient completes 5-7 53f 10 parts or 50-74%  FIM - Upper Body Dressing/Undressing Upper body dressing/undressing steps patient completed: Pull shirt over trunk Upper body dressing/undressing: 5: Set-up assist to: Obtain clothing/put away FIM - Lower Body Dressing/Undressing Lower body dressing/undressing steps patient completed: Thread/unthread right underwear leg;Thread/unthread left underwear leg Lower body dressing/undressing: 3: Mod-Patient completed 50-74% of tasks  FIM - Toileting Toileting steps completed by patient: Performs perineal hygiene Toileting Assistive Devices: Grab bar or rail for support Toileting: 2: Max-Patient completed 1 of 3 steps  FIM - Diplomatic Services operational officer Devices: Therapist, music Transfers:  5-From toilet/BSC: Supervision (verbal cues/safety issues)  FIM - Banker Devices: Sliding board Bed/Chair Transfer: 4: Bed >  Chair or W/C: Min A (steadying Pt. > 75%)  FIM - Locomotion: Wheelchair Locomotion: Wheelchair: 2: Travels 50 - 149 ft with supervision, cueing or coaxing FIM - Locomotion: Ambulation Locomotion: Ambulation: 0: Activity did not occur  Comprehension Comprehension Mode: Auditory Comprehension: 5-Understands basic 90% of the time/requires cueing < 10% of the time  Expression Expression Mode: Verbal Expression: 4-Expresses basic 75 - 89% of the time/requires cueing 10 - 24% of the time. Needs helper to occlude trach/needs to repeat words.  Social Interaction Social Interaction: 5-Interacts appropriately 90% of the time - Needs monitoring or encouragement for participation or interaction.  Problem Solving Problem Solving: 5-Solves basic 90% of the time/requires cueing < 10% of the time  Memory Memory: 4-Recognizes or recalls 75 - 89% of the time/requires cueing 10 - 24% of the time Medical Problem List and Plan:  1. DVT Prophylaxis/Anticoagulation: Pharmaceutical: Coumadin  2. Pain Management: Will schedule pain medication prior to therapy schedule. Discussed pain mgt in the setting of a BKA with this patient 3. Mood:  Ego support provided. Continue to monitor.  4. Neuropsych: This patient is capable of making decisions on his/her own behalf.  5. ESRD: Nephrology to assist with HD needs. Continue 3 X week.  7. Acute on chronic anemia: Routine labs with HD. Continue aranesp weekly. Drop in H/H to 8.5. Monitor for  further symptoms--may need transfusion.    8. Right toe ulcer/woundcare R Lat malleolus decubitus ulcer: continue silvadene cream daily. PRAFO for protection when in bed.   -left BKA clean and intact- watch for increase in erythema 9. HTN: monitor with BID checks. Continue lopressor-hold today due to low blood pressure.  10. Dyslipidemia: continue vytorin.  11. DM type 2 with severe neuropathy: Monitor BS with bid checks-seems to be reasonably controlled at present. Continue  Amaryl daily. SSI 12. Constipation:  suppository prn.  13. A Fib: Continue to check HR on bid basis. Continue lopressor. Continue chronic coumadin.  14. CAD with moderate to severe aortic stenosis: monitor for symptoms with increase in activity levels. Continue lopressor, vytorin and coumadin, no signs of failure   LOS (Days) 9 A FACE TO FACE EVALUATION WAS PERFORMED   08/02/2011, 8:01 AM

## 2011-08-03 ENCOUNTER — Inpatient Hospital Stay (HOSPITAL_COMMUNITY): Payer: MEDICARE | Admitting: Occupational Therapy

## 2011-08-03 ENCOUNTER — Inpatient Hospital Stay (HOSPITAL_COMMUNITY): Payer: MEDICARE | Admitting: Physical Therapy

## 2011-08-03 ENCOUNTER — Inpatient Hospital Stay (HOSPITAL_COMMUNITY): Payer: MEDICARE

## 2011-08-03 DIAGNOSIS — E1165 Type 2 diabetes mellitus with hyperglycemia: Secondary | ICD-10-CM

## 2011-08-03 DIAGNOSIS — S78119A Complete traumatic amputation at level between unspecified hip and knee, initial encounter: Secondary | ICD-10-CM

## 2011-08-03 DIAGNOSIS — Z5189 Encounter for other specified aftercare: Secondary | ICD-10-CM

## 2011-08-03 DIAGNOSIS — I70269 Atherosclerosis of native arteries of extremities with gangrene, unspecified extremity: Secondary | ICD-10-CM

## 2011-08-03 LAB — GLUCOSE, CAPILLARY
Glucose-Capillary: 147 mg/dL — ABNORMAL HIGH (ref 70–99)
Glucose-Capillary: 121 mg/dL — ABNORMAL HIGH (ref 70–99)
Glucose-Capillary: 236 mg/dL — ABNORMAL HIGH (ref 70–99)

## 2011-08-03 MED ORDER — CIPROFLOXACIN HCL 250 MG PO TABS
250.0000 mg | ORAL_TABLET | Freq: Every day | ORAL | Status: DC
  Administered 2011-08-04 – 2011-08-09 (×6): 250 mg via ORAL
  Filled 2011-08-03 (×8): qty 1

## 2011-08-03 MED ORDER — WARFARIN SODIUM 1 MG PO TABS
1.0000 mg | ORAL_TABLET | Freq: Once | ORAL | Status: AC
  Administered 2011-08-03: 1 mg via ORAL
  Filled 2011-08-03: qty 1

## 2011-08-03 MED ORDER — CIPROFLOXACIN HCL 250 MG PO TABS: 250.0000 mg | ORAL_TABLET | Freq: Two times a day (BID) | ORAL | Status: DC

## 2011-08-03 NOTE — Progress Notes (Signed)
Patient ID: Alan [name], male   DOB: 10/16/1931, 78 y.o.   MRN: [id number] Subjective/Complaints: Pain in L stump.  A 12 point review of systems has been performed and if not noted above is otherwise negative.   Objective: Vital Signs: Blood pressure 92/40, pulse 80, temperature 98.4 F (36.9 C), temperature source Oral, resp. rate 18, height 6' (1.829 m), weight 107.9 kg (237 lb 14 oz), SpO2 100.00%. No results found.  Basename 08/02/11 1553 07/31/11 1611  WBC 9.8 8.8  HGB 9.0* 8.9*  HCT 28.2* 27.6*  PLT 316 315    Basename 08/02/11 1553 07/31/11 1611  NA 132* 130*  K 4.1 3.7  CL 93* 91*  CO2 23 23  GLUCOSE 103* 200*  BUN 36* 45*  CREATININE 8.42* 8.99*  CALCIUM 8.7 8.3*   CBG (last 3)   Basename 08/03/11 0728 08/02/11 2132 08/02/11 1147  GLUCAP 147* 128* 182*    Wt Readings from Last 3 Encounters:  08/03/11 107.9 kg (237 lb 14 oz)  07/24/11 103.3 kg (227 lb 11.8 oz)  07/24/11 103.3 kg (227 lb 11.8 oz)    Physical Exam:   General:   oriented x 3, No apparent distress-- HEENT: Head is normocephalic, atraumatic, PERRLA, EOMI, sclera anicteric, oral mucosa pink and moist, dentition intact, ext ear canals clear,  Neck: Supple without JVD or lymphadenopathy Heart: IrregReg rate and rhythm. SE murmurs. No  rubs or gallops Chest: CTA bilaterally without wheezes, rales, or rhonchi; no distress Abdomen: Soft, non-tender, non-distended, bowel sounds positive. Extremities: No clubbing, cyanosis, or edema. Pulses are 2+. Dry denuded blisters on 3rd and 4th toes.  Dry ulcer on lateral malleolus.  L-BKA with sutures intact and minimal dry blood on dressing. blanchable erythema with decrease in edema.   Bruising resolving.  Skin: BK is intact with staples ,small amt serosanguinous drainage. Some hyperemia and bruising around the wound but area does not appear infected/ is not warm- appropriately tender. Right third toe blister dry and lateral malleolus ulcer clean and  dry. Neuro: Pt is cognitively appropriate with normal insight, memory, and awareness. Cranial nerves 2-12 are intact. Sensory exam is normal except for distal extremities. Reflexes are 2+ in all 4's. Fine motor coordination is intact. No tremors. Motor function is grossly 5/5 except for LLE which is 3/5.  Musculoskeletal: Full ROM, No pain with AROM or PROM in the neck, trunk, or extremities. Posture appropriate Psych: Pt's affect is appropriate. Pt is cooperative  Assessment/Plan: 1. Functional deficits secondary to left BKA which require 3+ hours per day of interdisciplinary therapy in a comprehensive inpatient rehab setting. Physiatrist is providing close team supervision and 24 hour management of active medical problems listed below. Physiatrist and rehab team continue to assess barriers to discharge/monitor patient progress toward functional and medical goals. FIM: FIM - Bathing Bathing Steps Patient Completed: Chest;Right Arm;Left Arm;Abdomen;Right upper leg;Left lower leg (including foot);Left upper leg Bathing: 4: Min-Patient completes 8-9 38f 10 parts or 75+ percent  FIM - Upper Body Dressing/Undressing Upper body dressing/undressing steps patient completed: Thread/unthread right sleeve of pullover shirt/dresss;Thread/unthread left sleeve of pullover shirt/dress;Put head through opening of pull over shirt/dress;Pull shirt over trunk Upper body dressing/undressing: 5: Set-up assist to: Obtain clothing/put away FIM - Lower Body Dressing/Undressing Lower body dressing/undressing steps patient completed: Thread/unthread right underwear leg;Thread/unthread left underwear leg;Thread/unthread left pants leg;Thread/unthread right pants leg Lower body dressing/undressing: 2: Max-Patient completed 25-49% of tasks  FIM - Toileting Toileting steps completed by patient: Adjust clothing prior to toileting Toileting  Assistive Devices: Grab bar or rail for support Toileting: 1: Total-Patient  completed zero steps, helper did all 3  FIM - Diplomatic Services operational officer Devices: Grab bars Toilet Transfers: 5-From toilet/BSC: Supervision (verbal cues/safety issues)  FIM - Architectural technologist Transfer: 4: Bed > Chair or W/C: Min A (steadying Pt. > 75%);4: Chair or W/C > Bed: Min A (steadying Pt. > 75%)  FIM - Locomotion: Wheelchair Locomotion: Wheelchair: 4: Travels 150 ft or more: maneuvers on rugs and over door sillls with minimal assistance (Pt.>75%) FIM - Locomotion: Ambulation Locomotion: Ambulation: 0: Activity did not occur  Comprehension Comprehension Mode: Auditory Comprehension: 5-Understands complex 90% of the time/Cues < 10% of the time  Expression Expression Mode: Verbal Expression: 4-Expresses basic 75 - 89% of the time/requires cueing 10 - 24% of the time. Needs helper to occlude trach/needs to repeat words.  Social Interaction Social Interaction: 5-Interacts appropriately 90% of the time - Needs monitoring or encouragement for participation or interaction.  Problem Solving Problem Solving: 5-Solves basic 90% of the time/requires cueing < 10% of the time  Memory Memory: 4-Recognizes or recalls 75 - 89% of the time/requires cueing 10 - 24% of the time Medical Problem List and Plan:  1. DVT Prophylaxis/Anticoagulation: Pharmaceutical: Coumadin  2. Pain Management: Will schedule pain medication prior to therapy schedule. Discussed pain mgt in the setting of a BKA with this patient 3. Mood:  Ego support provided. Continue to monitor.  4. Neuropsych: This patient is capable of making decisions on his/her own behalf.  5. ESRD: Nephrology to assist with HD needs. Continue 3 X week.  7. Acute on chronic anemia: Routine labs with HD. Continue aranesp weekly. Drop in H/H to 8.5. Monitor for  further symptoms--may need transfusion.    8. PVD both small and large vessel Right toe ulcer/woundcare  R Lat malleolus decubitus ulcer: continue silvadene cream daily. PRAFO for protection when in bed.   -left BKA -  increase in erythema add cipro 9. HTN: monitor with BID checks. Continue lopressor-hold today due to low blood pressure.  10. Dyslipidemia: continue vytorin.  11. DM type 2 with severe neuropathy: Monitor BS with bid checks-seems to be reasonably controlled at present. Continue Amaryl daily. SSI 12. Constipation:  suppository prn.  13. A Fib: Continue to check HR on bid basis. Continue lopressor. Continue chronic coumadin.  14. CAD with moderate to severe aortic stenosis: monitor for symptoms with increase in activity levels. Continue lopressor, vytorin and coumadin, no signs of failure   LOS (Days) 10 A FACE TO FACE EVALUATION WAS PERFORMED   08/03/2011, 8:01 AM

## 2011-08-03 NOTE — Progress Notes (Signed)
Subjective:  Feeling ok this am; pain controlled with no specific complaints; eating ok and having BM's  Vital signs in last 24 hours: Filed Vitals:   08/02/11 2105 08/03/11 0500 08/03/11 0630 08/03/11 0832  BP: 120/62  92/40 132/81  Pulse: 61  80 82  Temp:   98.4 F (36.9 C)   TempSrc:   Oral   Resp:   18   Height:      Weight:  107.9 kg (237 lb 14 oz) 107.9 kg (237 lb 14 oz)   SpO2:   100%    Weight change: 3.2 kg (7 lb 0.9 oz)  Intake/Output Summary (Last 24 hours) at 08/03/11 1012 Last data filed at 08/03/11 0828  Gross per 24 hour  Intake    540 ml  Output   2033 ml  Net  -1493 ml   Labs: Basic Metabolic Panel:  Lab 08/02/11 1610 07/31/11 1611  NA 132* 130*  K 4.1 3.7  CL 93* 91*  CO2 23 23  GLUCOSE 103* 200*  BUN 36* 45*  CREATININE 8.42* 8.99*  CALCIUM 8.7 8.3*  ALB -- --  PHOS 4.5 4.3   Liver Function Tests:  Lab 08/02/11 1553 07/31/11 1611  AST -- --  ALT -- --  ALKPHOS -- --  BILITOT -- --  PROT -- --  ALBUMIN 2.8* 2.6*   No results found for this basename: LIPASE:3,AMYLASE:3 in the last 168 hours No results found for this basename: AMMONIA:3 in the last 168 hours CBC:  Lab 08/02/11 1553 07/31/11 1611  WBC 9.8 8.8  NEUTROABS -- --  HGB 9.0* 8.9*  HCT 28.2* 27.6*  MCV 91.3 91.1  PLT 316 315   Cardiac Enzymes: No results found for this basename: CKTOTAL:5,CKMB:5,CKMBINDEX:5,TROPONINI:5 in the last 168 hours CBG:  Lab 08/03/11 0728 08/02/11 2132 08/02/11 1147 08/02/11 0724 08/01/11 2054  GLUCAP 147* 128* 182* 158* 174*    Iron Studies: No results found for this basename: IRON,TIBC,TRANSFERRIN,FERRITIN in the last 72 hours Studies/Results: No results found. Medications:      . allopurinol  150 mg Oral Daily  . aspirin EC  81 mg Oral Daily  . bacitracin   Topical Daily  . calcium carbonate  1 tablet Oral TID AC  . ciprofloxacin  250 mg Oral Q breakfast  . darbepoetin (ARANESP) injection - DIALYSIS  150 mcg Intravenous Q Sat-HD  .  doxycycline  100 mg Oral Q12H  . ezetimibe-simvastatin  1 tablet Oral QHS  . feeding supplement  1 Container Oral Q24H  . ferric gluconate (FERRLECIT/NULECIT) IV  62.5 mg Intravenous Q Thu-HD  . gabapentin  100 mg Oral QHS  . glimepiride  2 mg Oral QAC breakfast  . hydrocortisone  25 mg Rectal BID  . insulin aspart  0-15 Units Subcutaneous TID WC  . metoprolol tartrate  12.5 mg Oral BID  . oxyCODONE  5 mg Oral BID  . pantoprazole  40 mg Oral Q1200  . paricalcitol  4 mcg Intravenous Q T,Th,Sa-HD  . senna-docusate  2 tablet Oral BID  . Warfarin - Pharmacist Dosing Inpatient   Does not apply q1800  . DISCONTD: ciprofloxacin  250 mg Oral BID  . DISCONTD: HYDROcodone-acetaminophen  1-2 tablet Oral BID  . DISCONTD: metoprolol tartrate  37.5 mg Oral BID    I  have reviewed scheduled and prn medications.  Physical Exam  General: comfortable, sitting up in wheelchair, NAD  Heart: RRR II/VI murmur  Lungs: slightly diminished bases, no rhonchi, wheezes or rales  Abdomen:obese soft, obese, NT/ND, +BS  Extremities: left BKA with ace dressing; no right LE edema  Dialysis Access: right upper AVF +T/B   Dialysis Prescription: 4 1/4hr, EDW 108.5, QB 500, QD AF 1.5, 2K/2.25Ca. RUA AVF 15G needles. Heparin 3400 Units. Zemplar TIW, No epogen. Venofer 100mg  IV Q 2weekly.   Assessment/Plan  1. PVD with ulceration- S/P Left BKA, 07/21/11 [facility]- POD #13. on Rehab with PT/OT.  2. ESRD: HD TTS SWAF; next Tx tomorrow; Will need new EDW at time of discharge; K level 4.1 yesterday; repleted with HD; cont follow labs pre HD  3. Anemia: hgb stable, ^ 9.0; cont Aranesp (restarted 07/21/11); same wkly IV Fe; follow trend 4. Metabolic Bone disease- Ca 8.7, phos 4.5; controlled on CaCO3 and Zemplar; follow ca/phos 5. Hypertension- controlled on meds (metoprolol tartrate 37.5 bid) and UF with HD 6. DM/HLD- Vytorin, Amaryl; CBG and SSI; HgbA1c 6.3%; glucose 147 this am; primary following 7. Nutrition-  albumin ^ 2.8; cont ^ protein renal carb modified diet; suppl prn 8. Afib/flutter-on Coumadin with INR 2.45 today; no active bleeding; pharmacy following 9. Hemorrhoids - improving on anusol HC - daily miralax 10. Gout- no problems on allopurinol 11. Disposition- per rehab with anticipated discharge next Thu 08/09/11 after HD; same oupt HD (TTS) schedule  [name], FNP-C [facility] Pager [phone]  08/03/2011,10:12 AM  LOS: 10 days   In gym working out with PT.  C/O rle toes, concerned that same problem going on as in amputated extrem. Will ask [facility] to see for f/u Alan [name] C .

## 2011-08-03 NOTE — Progress Notes (Addendum)
ANTICOAGULATION CONSULT NOTE - Follow Up Consult  Pharmacy Consult for Coumadin Indication:   atrial fibrillation  Allergies  Allergen Reactions  . Ancef (Cefazolin) Rash   Patient Measurements: Height: 6' (182.9 cm) Weight: 237 lb 14 oz (107.9 kg) IBW/kg (Calculated) : 77.6   Vital Signs: Temp: 98.4 F (36.9 C) (07/26 0630) Temp src: Oral (07/26 0630) BP: 132/81 mmHg (07/26 0832) Pulse Rate: 82  (07/26 0832)  Labs:  Basename 08/03/11 0500 08/02/11 1553 08/02/11 0614 08/01/11 0600 07/31/11 1611  HGB -- 9.0* -- -- 8.9*  HCT -- 28.2* -- -- 27.6*  PLT -- 316 -- -- 315  APTT -- -- -- -- --  LABPROT 27.0* -- 35.4* 36.4* --  INR 2.45* -- 3.47* 3.59* --  HEPARINUNFRC -- -- -- -- --  CREATININE -- 8.42* -- -- 8.99*  CKTOTAL -- -- -- -- --  CKMB -- -- -- -- --  TROPONINI -- -- -- -- --    Estimated Creatinine Clearance: 9.3 ml/min (by C-G formula based on Cr of 8.42).  Assessment: 78 yo male with h/o Afib/flutter on chronic coumadin prior to recent hospitalization on 07/16/11 for ischemic left leg.  His coumadin dose PTA was 2.5mg  every MWFSat and 5mg  every TTSun.   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  His coumadin was resumed on 7/13 (MD dosing) with Lovenox 30mg  SQ q24h bridge (stopped 7/16).  Today the INR is therapeutic, but he has been without Warfarin for 2 days. Spoke with RN who reported no evidence of bleeding.    He continues on Cipro - note drug/drug interaction: Anticoagulants / Quinolones  Significance: Severe  Warning: Hypoprothrombinemic effects of warfarin may be increased by ciprofloxacin. Although the literature is conflicting within and between quinolones, bleeding has been reported in a number of cases. Dosage reduction of the anticoagulant may be required.  Goal of Therapy:  INR 2-3   Plan:  - Coumadin 1 mg today - f/up daily INR  Nadara Mustard, PharmD., MS Clinical Pharmacist Pager:  [phone]  Thank you for allowing pharmacy to be part of this  patients care team. 08/03/2011 11:07 AM

## 2011-08-03 NOTE — Progress Notes (Signed)
Occupational Therapy Session Note  Patient Details  Name: Alan [name] MRN: [id number] Date of Birth: June 08, 1931  Today's Date: 08/03/2011 Time: 0900-1000 Time Calculation (min): 60 min  Short Term Goals: Week 1:  OT Short Term Goal 1 (Week 1): Pt will transfer to [facility] with steady assist. OT Short Term Goal 1 - Progress (Week 1): Met OT Short Term Goal 2 (Week 1): Pt will be able to pull pants down for toileting with min assist. OT Short Term Goal 2 - Progress (Week 1): Progressing toward goal OT Short Term Goal 3 (Week 1): Pt will be able to pull pants up with mod assist. OT Short Term Goal 3 - Progress (Week 1): Progressing toward goal OT Short Term Goal 4 (Week 1): Pt will bathe with supervision. OT Short Term Goal 4 - Progress (Week 1): Progressing toward goal Week 2:  OT Short Term Goal 1 (Week 2): Pt will transfer to [facility] with supervision. OT Short Term Goal 2 (Week 2): Pt will be able to remove pants for toileting with min assist. OT Short Term Goal 3 (Week 2): Pt will be able to pull up pants post toileting with mod assist. OT Short Term Goal 4 (Week 2): Pt will be able to don clothing from bed level.  Skilled Therapeutic Interventions/Progress Updates: Pt was very agreeable to therapy today and participated well.  Pt scheduled for B/D this am, but he did not have anymore clean pants.  He opted to work on UB bathing and dressing with grooming only from wheel chair level.  From EOB, pt worked on weight shifting left and right to elevate alternate hip and lateral leans on elbows to facilitate doffing/donning pants. He transferred to drop arm bsc from bed and back with supervision and set up to steady BSC. Pt worked on lateral leans from [facility] to IT consultant and clean up.  BUE AROM exercises for generalized strengthening and activity tolerance.     Therapy Documentation Precautions:  Precautions Precautions: Fall Required Braces or Orthoses: Other  Brace/Splint Other Brace/Splint: PRAFO rt foot Restrictions Weight Bearing Restrictions: Yes LLE Weight Bearing: Weight bearing as tolerated  Pain: Pain Assessment Pain Assessment: No/denies pain  See FIM for current functional status  Therapy/Group: Individual Therapy  Kairee Kozma 08/03/2011, 10:24 AM

## 2011-08-03 NOTE — Progress Notes (Signed)
Physical Therapy Session Note  Patient Details  Name: Alan [name] MRN: [id number] Date of Birth: 1931-02-15  Today's Date: 08/03/2011 Time: 4098-1191 Time Calculation (min): 45 min  Short Term Goals: Week 1:  PT Short Term Goal 1 (Week 1): Same as LTGs  Skilled Therapeutic Interventions/Progress Updates: Pt's affect much brighter today.  He is cooperative and trying hard in therapy.  SB transfer to L slightly uphill, with min assist, set-up of w/c and board.  Therapeutic exercise performed with LE to increase strength for functional mobility: 10 x 1 each:supine bil bridging with and without bolster, in prone, bil hip extension, assistance for RLE to achieve hip extension, in sitting, bil knee extension with isometric contraction at end range.  Bed mobility ( on mat) in supine and prone to scoot down in bed, tactile cues and min assist. Mat> w/c squat pivot transfer to L, downhill, with mod assist due to pt sitting early on wheel of w/c.  W/c mobility x 100' using bil UEs to propel, with min assist consistently for veering to R.  Pt has visible bil hand wasting, and reports R hand is weaker than L.  Plan to put Theraband on R rim of w/c to increase friction and improve steering.      Therapy Documentation Precautions:  Precautions Precautions: Fall Required Braces or Orthoses: Other Brace/Splint Other Brace/Splint: PRAFO rt foot Restrictions Weight Bearing Restrictions: Yes LLE Weight Bearing: Weight bearing as tolerated        See FIM for current functional status  Therapy/Group: Individual Therapy  [name] 08/03/2011, 2:37 PM

## 2011-08-03 NOTE — Progress Notes (Signed)
Patient ID: Alan [name], male   DOB: Oct 19, 1931, 78 y.o.   MRN: [id number] Asked by Dr. Lowell Guitar to see regarding the patient's concern for healing issues in his right foot. I did review his prior arteriogram and his recent surgery with [name]. He does have a stable healing superficial ulceration over his lateral malleolus and also over his second and third toes. These were all very superficial and appear to be healing there is no evidence of infection or surrounding erythema. He does have severe tibial occlusive disease with patent superficial femoral artery popliteal artery. He was reassured and will continue to be followed with [name] as an outpatient

## 2011-08-03 NOTE — Progress Notes (Signed)
Physical Therapy Note  Patient Details  Name: Alan [name] MRN: [id number] Date of Birth: [birth year] Today's Date: 08/03/2011  13:30-14:25 individual therapy pt denied pain.   Pt was in the bathroom. Pt and wife were educated on lateral leans and wc set up and placement beside BSC. pt was taught to bring [facility] around castor wheel to stabilize commode. Pt was able to get pants on and off when using bed and wc to lean on elbows.   Julian Reil 08/03/2011, 4:31 PM

## 2011-08-04 ENCOUNTER — Inpatient Hospital Stay (HOSPITAL_COMMUNITY): Payer: MEDICARE | Admitting: *Deleted

## 2011-08-04 ENCOUNTER — Inpatient Hospital Stay (HOSPITAL_COMMUNITY): Payer: MEDICARE

## 2011-08-04 LAB — CBC
HCT: 29.8 % — ABNORMAL LOW (ref 39.0–52.0)
Hemoglobin: 9.3 g/dL — ABNORMAL LOW (ref 13.0–17.0)
RBC: 3.25 MIL/uL — ABNORMAL LOW (ref 4.22–5.81)

## 2011-08-04 LAB — RENAL FUNCTION PANEL
BUN: 36 mg/dL — ABNORMAL HIGH (ref 6–23)
CO2: 24 mEq/L (ref 19–32)
Chloride: 93 mEq/L — ABNORMAL LOW (ref 96–112)
GFR calc Af Amer: 7 mL/min — ABNORMAL LOW (ref >90)
Glucose, Bld: 204 mg/dL — ABNORMAL HIGH (ref 70–99)
Potassium: 3.7 mEq/L (ref 3.5–5.1)
Sodium: 133 mEq/L — ABNORMAL LOW (ref 135–145)

## 2011-08-04 LAB — GLUCOSE, CAPILLARY
Glucose-Capillary: 124 mg/dL — ABNORMAL HIGH (ref 70–99)
Glucose-Capillary: 108 mg/dL — ABNORMAL HIGH (ref 70–99)

## 2011-08-04 MED ORDER — PARICALCITOL 5 MCG/ML IV SOLN
INTRAVENOUS | Status: AC
  Administered 2011-08-04: 4 ug via INTRAVENOUS
  Filled 2011-08-04: qty 1

## 2011-08-04 MED ORDER — WARFARIN SODIUM 2 MG PO TABS
2.0000 mg | ORAL_TABLET | Freq: Once | ORAL | Status: AC
  Administered 2011-08-04: 2 mg via ORAL
  Filled 2011-08-04: qty 1

## 2011-08-04 MED ORDER — DARBEPOETIN ALFA-POLYSORBATE 150 MCG/0.3ML IJ SOLN
INTRAMUSCULAR | Status: AC
  Filled 2011-08-04: qty 0.3

## 2011-08-04 NOTE — Progress Notes (Signed)
ANTICOAGULATION CONSULT NOTE - Follow Up Consult  Pharmacy Consult for Coumadin Indication:   atrial fibrillation  Allergies  Allergen Reactions  . Ancef (Cefazolin) Rash   Patient Measurements: Height: 6' (182.9 cm) Weight: 234 lb 2.1 oz (106.2 kg) IBW/kg (Calculated) : 77.6   Vital Signs: Temp: 98.8 F (37.1 C) (07/27 0539) Temp src: Oral (07/27 0539) BP: 101/46 mmHg (07/27 0539) Pulse Rate: 81  (07/27 0539)  Labs:  Basename 08/04/11 0516 08/03/11 0500 08/02/11 1553 08/02/11 0614  HGB -- -- 9.0* --  HCT -- -- 28.2* --  PLT -- -- 316 --  APTT -- -- -- --  LABPROT 22.5* 27.0* -- 35.4*  INR 1.94* 2.45* -- 3.47*  HEPARINUNFRC -- -- -- --  CREATININE -- -- 8.42* --  CKTOTAL -- -- -- --  CKMB -- -- -- --  TROPONINI -- -- -- --    Estimated Creatinine Clearance: 9.2 ml/min (by C-G formula based on Cr of 8.42).  Assessment: 78 yo male with h/o Afib/flutter on chronic coumadin prior to recent hospitalization on 07/16/11 for ischemic left leg.  His coumadin dose PTA was 2.5mg  every MWFSat and 5mg  every TTSun.   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  His coumadin was resumed on 7/13 (MD dosing) with Lovenox 30mg  SQ q24h bridge (stopped 7/16).  Today the INR has decreased just below desired goal range.  No evidence of bleeding.    He continues on Cipro -drug/drug interaction (we are following closely):  Warning: Hypoprothrombinemic effects of warfarin may be increased by ciprofloxacin. Although the literature is conflicting within and between quinolones, bleeding has been reported in a number of cases. Dosage reduction of the anticoagulant may be required.  Goal of Therapy:  INR 2-3   Plan:  - Coumadin 2 mg today - f/up daily INR - Consider length of therapy for antibiotics.  Nadara Mustard, PharmD., MS Clinical Pharmacist Pager:  [phone]  Thank you for allowing pharmacy to be part of this patients care team. 08/04/2011 9:15 AM

## 2011-08-04 NOTE — Progress Notes (Signed)
Subjective:  Feeling fine; no complaints; ate 90% lunch; voiced some concerns having BM while on HD; otherwise ready for discharge next week  Vital signs in last 24 hours: Filed Vitals:   08/03/11 1559 08/03/11 2003 08/04/11 0500 08/04/11 0539  BP: 137/59 113/66  101/46  Pulse: 82 81  81  Temp: 97.9 F (36.6 C)   98.8 F (37.1 C)  TempSrc: Oral   Oral  Resp: 16   20  Height:      Weight:   106.2 kg (234 lb 2.1 oz) 106.2 kg (234 lb 2.1 oz)  SpO2: 100%   96%   Weight change: -1.1 kg (-2 lb 6.8 oz)  Intake/Output Summary (Last 24 hours) at 08/04/11 1229 Last data filed at 08/03/11 1811  Gross per 24 hour  Intake    480 ml  Output      0 ml  Net    480 ml   Labs: Basic Metabolic Panel:  Lab 08/02/11 7829 07/31/11 1611  NA 132* 130*  K 4.1 3.7  CL 93* 91*  CO2 23 23  GLUCOSE 103* 200*  BUN 36* 45*  CREATININE 8.42* 8.99*  CALCIUM 8.7 8.3*  ALB -- --  PHOS 4.5 4.3   Liver Function Tests:  Lab 08/02/11 1553 07/31/11 1611  AST -- --  ALT -- --  ALKPHOS -- --  BILITOT -- --  PROT -- --  ALBUMIN 2.8* 2.6*   No results found for this basename: LIPASE:3,AMYLASE:3 in the last 168 hours No results found for this basename: AMMONIA:3 in the last 168 hours CBC:  Lab 08/02/11 1553 07/31/11 1611  WBC 9.8 8.8  NEUTROABS -- --  HGB 9.0* 8.9*  HCT 28.2* 27.6*  MCV 91.3 91.1  PLT 316 315   Cardiac Enzymes: No results found for this basename: CKTOTAL:5,CKMB:5,CKMBINDEX:5,TROPONINI:5 in the last 168 hours CBG:  Lab 08/04/11 1110 08/04/11 0714 08/03/11 2109 08/03/11 1620 08/03/11 1133  GLUCAP 124* 74 99 236* 121*    Iron Studies: No results found for this basename: IRON,TIBC,TRANSFERRIN,FERRITIN in the last 72 hours Studies/Results: No results found. Medications:      . allopurinol  150 mg Oral Daily  . aspirin EC  81 mg Oral Daily  . bacitracin   Topical Daily  . calcium carbonate  1 tablet Oral TID AC  . ciprofloxacin  250 mg Oral Q breakfast  . darbepoetin  (ARANESP) injection - DIALYSIS  150 mcg Intravenous Q Sat-HD  . doxycycline  100 mg Oral Q12H  . ezetimibe-simvastatin  1 tablet Oral QHS  . feeding supplement  1 Container Oral Q24H  . ferric gluconate (FERRLECIT/NULECIT) IV  62.5 mg Intravenous Q Thu-HD  . gabapentin  100 mg Oral QHS  . glimepiride  2 mg Oral QAC breakfast  . hydrocortisone  25 mg Rectal BID  . insulin aspart  0-15 Units Subcutaneous TID WC  . metoprolol tartrate  12.5 mg Oral BID  . oxyCODONE  5 mg Oral BID  . pantoprazole  40 mg Oral Q1200  . paricalcitol  4 mcg Intravenous Q T,Th,Sa-HD  . senna-docusate  2 tablet Oral BID  . warfarin  1 mg Oral ONCE-1800  . warfarin  2 mg Oral ONCE-1800  . Warfarin - Pharmacist Dosing Inpatient   Does not apply q1800    I  have reviewed scheduled and prn medications.  Physical Exam  General: comfortable, sitting up in wheelchair, NAD  Heart: RRR II-III/VI murmur  Lungs: slightly diminished bases ony; otherwise CTAB,  no rhonchi, wheezes or rales  Abdomen: soft, obese, NT/ND, +BS  Extremities: left BKA with ace dressing; no right LE edema  Dialysis Access: right upper AVF +T/B   Dialysis Prescription: 4 1/4hr, EDW 108.5, QB 500, QD AF 1.5, 2K/2.25Ca. RUA AVF 15G needles. Heparin 3400 Units. Zemplar TIW, No epogen. Venofer 100mg  IV Q 2weekly.   Assessment/Plan  1. PVD with ulceration- S/P Left BKA, 07/21/11 [facility]- POD #14. on Rehab with PT/OT.  2. ESRD: HD TTS SWAF; next Tx today; Will need new EDW at time of discharge; K level 4.1 yesterday; repleted with HD; cont follow labs pre HD  3. Anemia: hgb stable, ^ 9.0; cont Aranesp (restarted 07/21/11); same wkly IV Fe; follow trend 4. Metabolic Bone disease- Ca 8.7, phos 4.5; controlled on CaCO3 and Zemplar; follow ca/phos 5. Hypertension- controlled on meds (metoprolol tartrate 37.5 bid) and UF with HD 6. DM/HLD- Vytorin, Amaryl; CBG and SSI; HgbA1c 6.3%; glucose 124 this am; primary following 7. Nutrition- albumin ^  2.8; cont ^ protein renal carb modified diet; suppl prn 8. Afib/flutter-remains on Coumadin with INR trending down; 1.94 today (2.45 yesterday); no active bleeding; pharmacy following 9. Hemorrhoids/Constipation - improving on anusol HC - daily miralax; encouraged to take qhs to prevent worries having BM on HD 10. Gout- no problems on allopurinol 11. Disposition- per rehab with anticipated discharge next Thu 08/09/11 after HD; same oupt HD (TTS) schedule  Alan Germany, FNP-C [facility] Pager [phone]  08/04/2011,12:29 PM.me LOS: 11 days       Alan [name] C

## 2011-08-04 NOTE — Progress Notes (Signed)
Physical Therapy Session Note  Patient Details  Name: Alan [name] MRN: [id number] Date of Birth: Oct 21, 1931  Today's Date: 08/04/2011 Time: 0900-1000 Time Calculation (min): 60 min  Short Term Goals: Week 1:  PT Short Term Goal 1 (Week 1): Same as LTGs  Skilled Therapeutic Interventions/Progress Updates:    w/c mobility- pt having trouble with propulsion even in controlled environment, veering to R- pt stating something was wrong with his w/c so PT switched to another chair and pt still having difficulty- pt agreed it was d/t UE weakness and trouble gripping d/t neuropathy- theraband on wheels did aid grip, pt needs asisst and cues for w/c set up and to position it for transfer  Therapeutic exercise performed to increase strength for functional mobility, maximize extensor stretch to knees and increase cardiorespiratory endurance. Nustep began level 5 but soon had to decrease level to 3 and pt took frequent rest breaks before reaching goal 10 min with Mets approx 2.3  HR 108 O2 95%     Therapy Documentation Precautions:  Precautions Precautions: Fall Required Braces or Orthoses: Other Brace/Splint Other Brace/Splint: PRAFO rt foot Restrictions Weight Bearing Restrictions: Yes LLE Weight Bearing: Weight bearing as tolerated Pain: Pain Assessment Pain Score: 0-No pain Mobility:transfers close S min A scoots to R and left onto mat and Nustep   Locomotion : Naval architect Mobility: Yes Wheelchair Assistance: 4: Min Education officer, museum: Both upper extremities Wheelchair Parts Management: Needs assistance  Trunk/Postural Assessment :   Trunk weakness noted, difficulty sitting upright unsupported in w/c during UE theex    Exercises: Cardiovascular Exercises NuStep: level 3 10 min with multile rests Total Joint Exercises Long Arc Quad: Both;Other reps (comment);Other (comment) (20 x 3) Other Exercises Other Exercises: shoulder press 10 x 3 5  lb Other Exercises: chest press seated 10 x 3 5 lb :    See FIM for current functional status  Therapy/Group: Individual Therapy  Michaelene Song 08/04/2011, 10:23 AM

## 2011-08-04 NOTE — Procedures (Signed)
Tolerating hemodialysis with no hemodynamic instability.  Resting. No changes needed. Alan [name] C

## 2011-08-04 NOTE — Progress Notes (Signed)
Patient ID: Alan [name], male   DOB: Sep 25, 1931, 78 y.o.   MRN: [id number] Subjective/Complaints: Pain in L stump controlled with meds.  Seen during therapy  A 12 point review of systems has been performed and if not noted above is otherwise negative.   Objective: Vital Signs: Blood pressure 101/46, pulse 81, temperature 98.8 F (37.1 C), temperature source Oral, resp. rate 20, height 6' (1.829 m), weight 106.2 kg (234 lb 2.1 oz), SpO2 96.00%. No results found.  Basename 08/02/11 1553  WBC 9.8  HGB 9.0*  HCT 28.2*  PLT 316    Basename 08/02/11 1553  NA 132*  K 4.1  CL 93*  CO2 23  GLUCOSE 103*  BUN 36*  CREATININE 8.42*  CALCIUM 8.7   CBG (last 3)   Basename 08/04/11 0714 08/03/11 2109 08/03/11 1620  GLUCAP 74 99 236*    Wt Readings from Last 3 Encounters:  08/04/11 106.2 kg (234 lb 2.1 oz)  07/24/11 103.3 kg (227 lb 11.8 oz)  07/24/11 103.3 kg (227 lb 11.8 oz)    Physical Exam:   General:   oriented x 3, No apparent distress-- HEENT: Head is normocephalic, atraumatic, PERRLA, EOMI, sclera anicteric, oral mucosa pink and moist, dentition intact, ext ear canals clear,  Neck: Supple without JVD or lymphadenopathy Heart: IrregReg rate and rhythm. SE murmurs. No  rubs or gallops Chest: CTA bilaterally without wheezes, rales, or rhonchi; no distress Abdomen: Soft, non-tender, non-distended, bowel sounds positive. Extremities: No clubbing, cyanosis, or edema. Pulses are 2+. Dry denuded blisters on 3rd and 4th toes.  Dry ulcer on lateral malleolus.  L-BKA with sutures intact and minimal dry blood on dressing. blanchable erythema with decrease in edema.   Bruising resolving.  Skin:dressing intact Neuro: Pt is cognitively appropriate with normal insight, memory, and awareness. Cranial nerves 2-12 are intact. Sensory exam is normal except for distal extremities. Reflexes are 2+ in all 4's. Fine motor coordination is intact. No tremors. Motor function is grossly 5/5  except for LLE which is 3/5.  Musculoskeletal: Full ROM, No pain with AROM or PROM in the neck, trunk, or extremities. Posture appropriate Psych: Pt's affect is appropriate. Pt is cooperative  Assessment/Plan: 1. Functional deficits secondary to left BKA which require 3+ hours per day of interdisciplinary therapy in a comprehensive inpatient rehab setting. Physiatrist is providing close team supervision and 24 hour management of active medical problems listed below. Physiatrist and rehab team continue to assess barriers to discharge/monitor patient progress toward functional and medical goals. FIM: FIM - Bathing Bathing Steps Patient Completed: Chest;Right Arm;Left Arm;Abdomen;Right upper leg;Left lower leg (including foot);Left upper leg Bathing: 4: Min-Patient completes 8-9 73f 10 parts or 75+ percent  FIM - Upper Body Dressing/Undressing Upper body dressing/undressing steps patient completed: Thread/unthread right sleeve of pullover shirt/dresss;Thread/unthread left sleeve of pullover shirt/dress;Put head through opening of pull over shirt/dress;Pull shirt over trunk Upper body dressing/undressing: 5: Set-up assist to: Obtain clothing/put away FIM - Lower Body Dressing/Undressing Lower body dressing/undressing steps patient completed: Thread/unthread right underwear leg;Thread/unthread left underwear leg;Thread/unthread left pants leg;Thread/unthread right pants leg Lower body dressing/undressing: 2: Max-Patient completed 25-49% of tasks  FIM - Toileting Toileting steps completed by patient: Adjust clothing prior to toileting Toileting Assistive Devices: Grab bar or rail for support Toileting: 1: Total-Patient completed zero steps, helper did all 3  FIM - Diplomatic Services operational officer Devices: Psychiatrist Transfers: 5-To toilet/BSC: Supervision (verbal cues/safety issues);5-From toilet/BSC: Supervision (verbal cues/safety issues)  FIM - Bed/Chair  Water engineer Transfer: 4: Bed > Chair or W/C: Min A (steadying Pt. > 75%);4: Chair or W/C > Bed: Min A (steadying Pt. > 75%)  FIM - Locomotion: Wheelchair Locomotion: Wheelchair: 4: Travels 150 ft or more: maneuvers on rugs and over door sillls with minimal assistance (Pt.>75%) FIM - Locomotion: Ambulation Locomotion: Ambulation: 0: Activity did not occur  Comprehension Comprehension Mode: Auditory Comprehension: 5-Understands complex 90% of the time/Cues < 10% of the time  Expression Expression Mode: Verbal Expression: 4-Expresses basic 75 - 89% of the time/requires cueing 10 - 24% of the time. Needs helper to occlude trach/needs to repeat words.  Social Interaction Social Interaction: 5-Interacts appropriately 90% of the time - Needs monitoring or encouragement for participation or interaction.  Problem Solving Problem Solving: 5-Solves complex 90% of the time/cues < 10% of the time  Memory Memory: 5-Recognizes or recalls 90% of the time/requires cueing < 10% of the time Medical Problem List and Plan:  1. DVT Prophylaxis/Anticoagulation: Pharmaceutical: Coumadin  2. Pain Management: Will schedule pain medication prior to therapy schedule. Discussed pain mgt in the setting of a BKA with this patient 3. Mood:  Ego support provided. Continue to monitor.  4. Neuropsych: This patient is capable of making decisions on his/her own behalf.  5. ESRD: Nephrology to assist with HD needs. Continue 3 X week.  7. Acute on chronic anemia: Routine labs with HD. Continue aranesp weekly. Drop in H/H to 8.5. Monitor for  further symptoms--may need transfusion.    8. PVD both small and large vessel Right toe ulcer/woundcare R Lat malleolus decubitus ulcer: continue silvadene cream daily. PRAFO for protection when in bed.   -left BKA -  increase in erythema add cipro 9. HTN: monitor with BID checks. Continue lopressor-hold today due to low  blood pressure.  10. Dyslipidemia: continue vytorin.  11. DM type 2 with severe neuropathy: Monitor BS with bid checks-seems to be reasonably controlled at present. Continue Amaryl daily. SSI 12. Constipation:  suppository prn.  13. A Fib: Continue to check HR on bid basis. Continue lopressor. Continue chronic coumadin.  14. CAD with moderate to severe aortic stenosis: monitor for symptoms with increase in activity levels. Continue lopressor, vytorin and coumadin, no signs of failure   LOS (Days) 11 A FACE TO FACE EVALUATION WAS PERFORMED   08/04/2011, 9:28 AM

## 2011-08-05 ENCOUNTER — Inpatient Hospital Stay (HOSPITAL_COMMUNITY): Payer: MEDICARE | Admitting: *Deleted

## 2011-08-05 LAB — GLUCOSE, CAPILLARY
Glucose-Capillary: 125 mg/dL — ABNORMAL HIGH (ref 70–99)
Glucose-Capillary: 70 mg/dL (ref 70–99)
Glucose-Capillary: 73 mg/dL (ref 70–99)

## 2011-08-05 LAB — PROTIME-INR
INR: 1.86 — ABNORMAL HIGH (ref 0.00–1.49)
Prothrombin Time: 21.8 seconds — ABNORMAL HIGH (ref 11.6–15.2)

## 2011-08-05 MED ORDER — WARFARIN SODIUM 5 MG PO TABS
5.0000 mg | ORAL_TABLET | Freq: Once | ORAL | Status: AC
  Administered 2011-08-05: 5 mg via ORAL
  Filled 2011-08-05 (×2): qty 1

## 2011-08-05 NOTE — Progress Notes (Signed)
Subjective:  Pleasant and talkative today with wife at bedside; ate lunch and sitting in wheelchair  Vital signs in last 24 hours: Filed Vitals:   08/04/11 1944 08/04/11 1947 08/04/11 2052 08/05/11 0522  BP: 105/51 106/58 129/66 100/47  Pulse: 66 72 79 79  Temp:  97 F (36.1 C) 98.2 F (36.8 C) 98.1 F (36.7 C)  TempSrc:  Oral Oral Oral  Resp: 14 16 17 18   Height:      Weight:  107.1 kg (236 lb 1.8 oz)  105.9 kg (233 lb 7.5 oz)  SpO2:  99% 98% 95%   Weight change: 2.4 kg (5 lb 4.7 oz)  Intake/Output Summary (Last 24 hours) at 08/05/11 1340 Last data filed at 08/05/11 0815  Gross per 24 hour  Intake    240 ml  Output   1106 ml  Net   -866 ml   Labs: Basic Metabolic Panel:  Lab 08/04/11 1610 08/02/11 1553 07/31/11 1611  NA 133* 132* 130*  K 3.7 4.1 3.7  CL 93* 93* 91*  CO2 24 23 23   GLUCOSE 204* 103* 200*  BUN 36* 36* 45*  CREATININE 7.74* 8.42* 8.99*  CALCIUM 8.7 8.7 8.3*  ALB -- -- --  PHOS 4.8* 4.5 4.3   Liver Function Tests:  Lab 08/04/11 1600 08/02/11 1553 07/31/11 1611  AST -- -- --  ALT -- -- --  ALKPHOS -- -- --  BILITOT -- -- --  PROT -- -- --  ALBUMIN 2.8* 2.8* 2.6*   No results found for this basename: LIPASE:3,AMYLASE:3 in the last 168 hours No results found for this basename: AMMONIA:3 in the last 168 hours CBC:  Lab 08/04/11 1600 08/02/11 1553 07/31/11 1611  WBC 10.2 9.8 8.8  NEUTROABS -- -- --  HGB 9.3* 9.0* 8.9*  HCT 29.8* 28.2* 27.6*  MCV 91.7 91.3 91.1  PLT 308 316 315   Cardiac Enzymes: No results found for this basename: CKTOTAL:5,CKMB:5,CKMBINDEX:5,TROPONINI:5 in the last 168 hours CBG:  Lab 08/05/11 1112 08/05/11 0711 08/04/11 2059 08/04/11 1110 08/04/11 0714  GLUCAP 124* 125* 108* 124* 74    Iron Studies: No results found for this basename: IRON,TIBC,TRANSFERRIN,FERRITIN in the last 72 hours Studies/Results: No results found. Medications:      . allopurinol  150 mg Oral Daily  . aspirin EC  81 mg Oral Daily  .  bacitracin   Topical Daily  . calcium carbonate  1 tablet Oral TID AC  . ciprofloxacin  250 mg Oral Q breakfast  . darbepoetin (ARANESP) injection - DIALYSIS  150 mcg Intravenous Q Sat-HD  . doxycycline  100 mg Oral Q12H  . ezetimibe-simvastatin  1 tablet Oral QHS  . feeding supplement  1 Container Oral Q24H  . ferric gluconate (FERRLECIT/NULECIT) IV  62.5 mg Intravenous Q Thu-HD  . gabapentin  100 mg Oral QHS  . glimepiride  2 mg Oral QAC breakfast  . hydrocortisone  25 mg Rectal BID  . insulin aspart  0-15 Units Subcutaneous TID WC  . metoprolol tartrate  12.5 mg Oral BID  . oxyCODONE  5 mg Oral BID  . pantoprazole  40 mg Oral Q1200  . paricalcitol  4 mcg Intravenous Q T,Th,Sa-HD  . senna-docusate  2 tablet Oral BID  . warfarin  2 mg Oral ONCE-1800  . warfarin  5 mg Oral ONCE-1800  . Warfarin - Pharmacist Dosing Inpatient   Does not apply q1800    I  have reviewed scheduled and prn medications.  Physical Exam  General: comfortable, sitting up in wheelchair, NAD  Heart: RRR II-III/VI murmur  Lungs: slightly diminished bases ony; otherwise CTAB, no rhonchi, wheezes or rales  Abdomen: soft, obese, NT/ND, +BS  Extremities: left BKA with ace dressing; no right LE edema  Dialysis Access: right upper AVF +T/B   Dialysis Prescription: 4 1/4hr, EDW 108.5, QB 500, QD AF 1.5, 2K/2.25Ca. RUA AVF 15G needles. Heparin 3400 Units. Zemplar TIW, No epogen. Venofer 100mg  IV Q 2weekly.   Assessment/Plan  1. PVD with ulceration- S/P Left BKA, 07/21/11 [facility]- POD #15. on Rehab with PT/OT.  2. ESRD: HD TTS SWAF; next Tx Tu; Will need new EDW at time of discharge; K level 3.7 yesterday; cont to replete with HD; follow   3. Anemia: hgb stable, ^ 9.3; cont Aranesp (restarted 07/21/11); same wkly IV Fe; follow trend 4. Metabolic Bone disease- Ca 8.7, phos 4.8; controlled on CaCO3 and Zemplar; follow ca/phos 5. Hypertension- controlled on meds (metoprolol tartrate 37.5 bid) and UF with HD;  BP low and only 0.867 removed; follow closely  6. DM/HLD- Vytorin, Amaryl; CBG and SSI; HgbA1c 6.3%; glucose 124 this am; primary following 7. Nutrition- albumin ^ 2.8; cont ^ protein renal carb modified diet; suppl prn 8. Afib/flutter-remains on Coumadin with INR's still trending down; 1.86 today (1.94 on Sat and 2.45 on Fri); now sub-therapeutic; pharmacy following with 5mg  scheduled today; may need tight heparin with HD; needs f/u  9. Hemorrhoids/Constipation - improving on anusol HC - daily miralax; encouraged to take qhs to prevent worries having BM on HD 10. Gout- no problems on allopurinol 11. Disposition- per rehab with anticipated discharge next Thu 08/09/11 after HD; same oupt HD (TTS) schedule  [name], FNP-C [facility] Pager [phone]  08/05/2011,1:40 PM  LOS: 12 days  Brynlei Klausner C

## 2011-08-05 NOTE — Progress Notes (Signed)
Patient ID: Alan [name], male   DOB: 1931/06/01, 78 y.o.   MRN: [id number] Subjective/Complaints: Pain in L stump controlled with meds.  Seen during therapy  A 12 point review of systems has been performed and if not noted above is otherwise negative.   Objective: Vital Signs: Blood pressure 100/47, pulse 79, temperature 98.1 F (36.7 C), temperature source Oral, resp. rate 18, height 6' (1.829 m), weight 105.9 kg (233 lb 7.5 oz), SpO2 95.00%. No results found.  Basename 08/04/11 1600 08/02/11 1553  WBC 10.2 9.8  HGB 9.3* 9.0*  HCT 29.8* 28.2*  PLT 308 316    Basename 08/04/11 1600 08/02/11 1553  NA 133* 132*  K 3.7 4.1  CL 93* 93*  CO2 24 23  GLUCOSE 204* 103*  BUN 36* 36*  CREATININE 7.74* 8.42*  CALCIUM 8.7 8.7   CBG (last 3)   Basename 08/05/11 0711 08/04/11 2059 08/04/11 1110  GLUCAP 125* 108* 124*    Wt Readings from Last 3 Encounters:  08/05/11 105.9 kg (233 lb 7.5 oz)  07/24/11 103.3 kg (227 lb 11.8 oz)  07/24/11 103.3 kg (227 lb 11.8 oz)    Physical Exam:   General:   oriented x 3, No apparent distress-- HEENT: Head is normocephalic, atraumatic, PERRLA, EOMI, sclera anicteric, oral mucosa pink and moist, dentition intact, ext ear canals clear,  Neck: Supple without JVD or lymphadenopathy Heart: IrregReg rate and rhythm. SE murmurs. No  rubs or gallops Chest: CTA bilaterally without wheezes, rales, or rhonchi; no distress Abdomen: Soft, non-tender, non-distended, bowel sounds positive. Extremities: No clubbing, cyanosis, or edema. Pulses are 2+. Dry denuded blisters on 3rd and 4th toes.  Dry ulcer on lateral malleolus.  L-BKA with sutures intact and minimal dry blood on dressing. blanchable erythema with decrease in edema.   Bruising resolving.,erythema confined to mid portion of incision Skin:dressing intact Neuro: Pt is cognitively appropriate with normal insight, memory, and awareness. Cranial nerves 2-12 are intact. Sensory exam is normal except  for distal extremities. Reflexes are 2+ in all 4's. Fine motor coordination is intact. No tremors. Motor function is grossly 5/5 except for LLE which is 3/5.  Musculoskeletal: Full ROM, No pain with AROM or PROM in the neck, trunk, or extremities. Posture appropriate Psych: Pt's affect is appropriate. Pt is cooperative  Assessment/Plan: 1. Functional deficits secondary to left BKA which require 3+ hours per day of interdisciplinary therapy in a comprehensive inpatient rehab setting. Physiatrist is providing close team supervision and 24 hour management of active medical problems listed below. Physiatrist and rehab team continue to assess barriers to discharge/monitor patient progress toward functional and medical goals. FIM: FIM - Bathing Bathing Steps Patient Completed: Chest;Right Arm;Left Arm;Abdomen;Right upper leg;Left lower leg (including foot);Left upper leg Bathing: 4: Min-Patient completes 8-9 47f 10 parts or 75+ percent  FIM - Upper Body Dressing/Undressing Upper body dressing/undressing steps patient completed: Thread/unthread right sleeve of pullover shirt/dresss;Thread/unthread left sleeve of pullover shirt/dress;Put head through opening of pull over shirt/dress;Pull shirt over trunk Upper body dressing/undressing: 5: Set-up assist to: Obtain clothing/put away FIM - Lower Body Dressing/Undressing Lower body dressing/undressing steps patient completed: Thread/unthread right underwear leg;Thread/unthread left underwear leg;Thread/unthread left pants leg;Thread/unthread right pants leg Lower body dressing/undressing: 2: Max-Patient completed 25-49% of tasks  FIM - Toileting Toileting steps completed by patient: Adjust clothing prior to toileting Toileting Assistive Devices: Grab bar or rail for support Toileting: 1: Total-Patient completed zero steps, helper did all 3  FIM - Diplomatic Services operational officer  Devices: Bedside commode Toilet Transfers: 5-To toilet/BSC:  Supervision (verbal cues/safety issues);5-From toilet/BSC: Supervision (verbal cues/safety issues)  FIM - Banker Devices: Sliding board Bed/Chair Transfer: 4: Bed > Chair or W/C: Min A (steadying Pt. > 75%);4: Chair or W/C > Bed: Min A (steadying Pt. > 75%)  FIM - Locomotion: Wheelchair Locomotion: Wheelchair: 4: Travels 150 ft or more: maneuvers on rugs and over door sillls with minimal assistance (Pt.>75%) FIM - Locomotion: Ambulation Locomotion: Ambulation: 0: Activity did not occur  Comprehension Comprehension Mode: Auditory Comprehension: 6-Follows complex conversation/direction: With extra time/assistive device  Expression Expression Mode: Verbal Expression: 5-Expresses basic needs/ideas: With no assist  Social Interaction Social Interaction: 6-Interacts appropriately with others with medication or extra time (anti-anxiety, antidepressant).  Problem Solving Problem Solving: 5-Solves complex 90% of the time/cues < 10% of the time  Memory Memory: 5-Recognizes or recalls 90% of the time/requires cueing < 10% of the time Medical Problem List and Plan:  1. DVT Prophylaxis/Anticoagulation: Pharmaceutical: Coumadin  2. Pain Management: Will schedule pain medication prior to therapy schedule. Discussed pain mgt in the setting of a BKA with this patient 3. Mood:  Ego support provided. Continue to monitor.  4. Neuropsych: This patient is capable of making decisions on his/her own behalf.  5. ESRD: Nephrology to assist with HD needs. Continue 3 X week.  7. Acute on chronic anemia: Routine labs with HD. Continue aranesp weekly. last H/H to 9.3. Monitor for  further symptoms    8. PVD both small and large vessel Right toe ulcer/woundcare R Lat malleolus decubitus ulcer: continue silvadene cream daily. PRAFO for protection when in bed.   -left BKA -  increase in erythema add cipro 9. HTN: monitor with BID checks. Continue lopressor-hold today due  to low blood pressure.  10. Dyslipidemia: continue vytorin.  11. DM type 2 with severe neuropathy: Monitor BS with bid checks-seems to be reasonably controlled at present. Continue Amaryl daily. SSI 12. Constipation:  suppository prn.  13. A Fib: Continue to check HR on bid basis. Continue lopressor. Continue chronic coumadin.  14. CAD with moderate to severe aortic stenosis: monitor for symptoms with increase in activity levels. Continue lopressor, vytorin and coumadin, no signs of failure   LOS (Days) 12 A FACE TO FACE EVALUATION WAS PERFORMED   08/05/2011, 8:18 AM

## 2011-08-05 NOTE — Progress Notes (Signed)
ANTICOAGULATION CONSULT NOTE - Follow Up Consult  Pharmacy Consult for Coumadin Indication:   atrial fibrillation  Allergies  Allergen Reactions  . Ancef (Cefazolin) Rash   Patient Measurements: Height: 6' (182.9 cm) Weight: 233 lb 7.5 oz (105.9 kg) IBW/kg (Calculated) : 77.6   Vital Signs: Temp: 98.1 F (36.7 C) (07/28 0522) Temp src: Oral (07/28 0522) BP: 100/47 mmHg (07/28 0522) Pulse Rate: 79  (07/28 0522)  Labs:  Basename 08/05/11 0555 08/04/11 1600 08/04/11 0516 08/03/11 0500 08/02/11 1553  HGB -- 9.3* -- -- 9.0*  HCT -- 29.8* -- -- 28.2*  PLT -- 308 -- -- 316  APTT -- -- -- -- --  LABPROT 21.8* -- 22.5* 27.0* --  INR 1.86* -- 1.94* 2.45* --  HEPARINUNFRC -- -- -- -- --  CREATININE -- 7.74* -- -- 8.42*  CKTOTAL -- -- -- -- --  CKMB -- -- -- -- --  TROPONINI -- -- -- -- --    Estimated Creatinine Clearance: 10.1 ml/min (by C-G formula based on Cr of 7.74).  Assessment: 78 yo male with h/o Afib/flutter on chronic coumadin prior to recent hospitalization on 07/16/11 for ischemic left leg.  His coumadin dose PTA was 2.5mg  every MWFSat and 5mg  every TTSun.   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  His coumadin was resumed on 7/13 (MD dosing) with Lovenox 30mg  SQ q24h bridge (stopped 7/16).  Today the INR has decreased more despite continued dosing.  No evidence of bleeding.    Home regimen:  Warfarin 2.5mg  on MWF and Saturday and 5mg  on TTh and Sunday.  INR was supra-therapeutic on admit at 3.59.  He continues on Cipro -drug/drug interaction (we are following closely):  Warning: Hypoprothrombinemic effects of warfarin may be increased by ciprofloxacin. Although the literature is conflicting within and between quinolones, bleeding has been reported in a number of cases. Dosage reduction of the anticoagulant may be required.  Goal of Therapy:  INR 2-3   Plan:  - Will try putting him back on home dose and adjust according to how he responds.  Start with Warfarin 5mg  x 1 today. -  f/up daily INR - Consider length of therapy for antibiotics.  Nadara Mustard, PharmD., MS Clinical Pharmacist Pager:  [phone]  Thank you for allowing pharmacy to be part of this patients care team. 08/05/2011 8:18 AM

## 2011-08-05 NOTE — Progress Notes (Signed)
Occupational Therapy Session Note  Patient Details  Name: Alan [name] MRN: [id number] Date of Birth: 01-30-1931  Today's Date: 08/05/2011 Time: 1515-1600 Time Calculation (min): 45 min  Short Term Goals: Week 2:  OT Short Term Goal 1 (Week 2): Pt will transfer to [facility] with supervision. OT Short Term Goal 2 (Week 2): Pt will be able to remove pants for toileting with min assist. OT Short Term Goal 3 (Week 2): Pt will be able to pull up pants post toileting with mod assist. OT Short Term Goal 4 (Week 2): Pt will be able to don clothing from bed level.  Skilled Therapeutic Interventions/Progress Updates:    OT session addressed transfers, dynamic sitting balance, sit to stand, wc mobility.  Pt propelled wc from room to gym with occasional minimal assist to keep straight.  Transferred to mat with minimal assist and back with supervision.  Performed sitting balance, trunk rotations with long pole and target, long ski poles with lateral reaching.  Did sit to stand in parallel bars x3 with mod assist sit to stand.  Stood for 20 seconds each time.  Needed manual facilitation to right leg and foot.  Pt. Happy about session progress.    Therapy Documentation Precautions:  Precautions Precautions: Fall Required Braces or Orthoses: Other Brace/Splint Other Brace/Splint: PRAFO rt foot Restrictions Weight Bearing Restrictions: Yes LLE Weight Bearing: Weight bearing as tolerated     Pain: none          See FIM for current functional status  Therapy/Group: Individual Therapy  Humberto Seals 08/05/2011, 5:45 PM

## 2011-08-05 NOTE — Progress Notes (Signed)
Patient returned to room from HD unit via hospital bed.  RUE AVF with positive bruit and thrill. Dressing clean, dry, intact on RUE over fistula site.  Patient without distress.  Vss, CBG 108.  Patient eating supper tray without difficulty.

## 2011-08-05 NOTE — Progress Notes (Signed)
Dr. Wynn Banker notified of patient's BP 90/45, HR 87 at this time and due Lopressor 12.5 mg tonight.  Order received:  Hold Lopressor dose tonight only.

## 2011-08-06 ENCOUNTER — Inpatient Hospital Stay (HOSPITAL_COMMUNITY): Payer: MEDICARE | Admitting: Physical Therapy

## 2011-08-06 ENCOUNTER — Inpatient Hospital Stay (HOSPITAL_COMMUNITY): Payer: MEDICARE | Admitting: Occupational Therapy

## 2011-08-06 LAB — PROTIME-INR
INR: 1.77 — ABNORMAL HIGH (ref 0.00–1.49)
Prothrombin Time: 20.9 seconds — ABNORMAL HIGH (ref 11.6–15.2)

## 2011-08-06 LAB — GLUCOSE, CAPILLARY
Glucose-Capillary: 186 mg/dL — ABNORMAL HIGH (ref 70–99)
Glucose-Capillary: 160 mg/dL — ABNORMAL HIGH (ref 70–99)

## 2011-08-06 MED ORDER — HEPARIN SODIUM (PORCINE) 1000 UNIT/ML DIALYSIS
20.0000 [IU]/kg | INTRAMUSCULAR | Status: DC | PRN
  Filled 2011-08-06: qty 3

## 2011-08-06 MED ORDER — WARFARIN SODIUM 5 MG PO TABS
5.0000 mg | ORAL_TABLET | Freq: Once | ORAL | Status: AC
  Administered 2011-08-06: 5 mg via ORAL
  Filled 2011-08-06: qty 1

## 2011-08-06 NOTE — Progress Notes (Signed)
Occupational Therapy Session Note  Patient Details  Name: Alan [name] MRN: [id number] Date of Birth: [birth year]  Today's Date: 08/06/2011 Time: 0900-1000 Time Calculation (min): 60 min  Short Term Goals: Week 1:  OT Short Term Goal 1 (Week 1): Pt will transfer to [facility] with steady assist. OT Short Term Goal 1 - Progress (Week 1): Met OT Short Term Goal 2 (Week 1): Pt will be able to pull pants down for toileting with min assist. OT Short Term Goal 2 - Progress (Week 1): Progressing toward goal OT Short Term Goal 3 (Week 1): Pt will be able to pull pants up with mod assist. OT Short Term Goal 3 - Progress (Week 1): Progressing toward goal OT Short Term Goal 4 (Week 1): Pt will bathe with supervision. OT Short Term Goal 4 - Progress (Week 1): Progressing toward goal Week 2:  OT Short Term Goal 1 (Week 2): Pt will transfer to [facility] with supervision. OT Short Term Goal 2 (Week 2): Pt will be able to remove pants for toileting with min assist. OT Short Term Goal 3 (Week 2): Pt will be able to pull up pants post toileting with mod assist. OT Short Term Goal 4 (Week 2): Pt will be able to don clothing from bed level.  Skilled Therapeutic Interventions/Progress Updates: Pt seen for BADL retraining of b/d from wheelchair level as pt was in w/c at start of session. Pt worked on lateral leans from chair with arm rests removed.  He needed min assist to fully wash bottom and adjust pants all the way over hips. Pt did 75% of the task himself.  Pt then worked on [facility] transfers to simulate his set up at home with his bed set at 26 1/2" high with the [facility] Hights at the head of the bed. Pt worked a second time on Acupuncturist from the [facility] with lateral leans.  He still needed minimal help to fully lift pants over hips, but is able to reach and clean. Pt needs an elongated drop arm BSC.     Therapy Documentation Precautions:  Precautions Precautions: Fall Required Braces or Orthoses: Other  Brace/Splint Other Brace/Splint: PRAFO rt foot Restrictions Weight Bearing Restrictions: Yes LLE Weight Bearing: Weight bearing as tolerated   Pain: Pain Assessment Pain Assessment: No/denies pain   See FIM for current functional status  Therapy/Group: Individual Therapy  SAGUIER,JULIA 08/06/2011, 11:48 AM

## 2011-08-06 NOTE — Progress Notes (Signed)
Physical Therapy Note  Patient Details  Name: Alan [name] MRN: [id number] Date of Birth: 1931/08/13 Today's Date: 08/06/2011  10:15-11:00 individual therapy pt denied pain.  Pt was educated on pressure relief in the chair and he was able to return demonstration. Pt did not recall technique from earlier in the stay. Practiced wc set up for transfers with supervision for technique. Pt was able to physically perform set up. Performed squat transfer to rt to elevated mat 26.5" for home bed with supervision. Sit to stand x 1 from elevated mat to walker with min assist. Pt stood 45 seconds and declined attempting to take a side step. pt was given HEP and performed some of them x 20 each.   Julian Reil 08/06/2011, 12:14 PM

## 2011-08-06 NOTE — Progress Notes (Signed)
Physical Therapy Discharge Summary  Patient Details  Name: Alan [name] MRN: [id number] Date of Birth: 01-22-1931  Today's Date: 08/06/2011 Time: 1015-1100 Time Calculation (min): 45 min  Patient has met 6 of 6 long term goals due to improved activity tolerance, improved postural control, increased range of motion, decreased pain and ability to compensate for deficits.  Patient to discharge at a wheelchair level Supervision.   Patient's care partner is independent to provide the necessary physical set up  assistance at discharge. Gait training deferred to home health due to safety, weakness, and the need for repetition for transfers with pt and wife to discharge safely. Pt can perform squat, scoot or sliding board transfers with wife stabilizing wc.  Reasons goals not met: N/A  Recommendation:  Patient will benefit from ongoing skilled PT services in home health setting to continue to advance safe functional mobility, address ongoing impairments in strength, activity tolerance, balance and minimize fall risk.  Equipment: 20x18 wheelchair with cushion, 32" sliding board, rw  Reasons for discharge: discharge from hospital  Patient/family agrees with progress made and goals achieved: Yes  PT Discharge Precautions/Restrictions Precautions Precautions: Fall Other Brace/Splint: PRAFO rt foot at all times Restrictions Other Position/Activity Restrictions: BKA on left.   Pain Pain Assessment Pain Assessment: No/denies pain Pain Score:   2 Vision/Perception  Vision - History Baseline Vision: Wears glasses only for reading Patient Visual Report: No change from baseline Vision - Assessment Eye Alignment: Within Functional Limits Perception Perception: Within Functional Limits Praxis Praxis: Intact  Cognition Overall Cognitive Status: Appears within functional limits for tasks assessed (memory slighty impaire PTA) Sensation Sensation Light Touch: Appears  Intact Stereognosis: Appears Intact Hot/Cold: Appears Intact Proprioception: Appears Intact Motor  Motor Motor - Discharge Observations: generalized weakness, and inability to walk currently.  Mobility Bed Mobility Rolling Left: 6: Modified independent (Device/Increase time) Sitting - Scoot to Edge of Bed: 6: Modified independent (Device/Increase time) Sit to Supine: 6: Modified independent (Device/Increase time) Transfers Sit to Stand: 4: Min assist;From bed (elevated bed 26") Stand Pivot Transfers: Not tested (comment) Locomotion  Ambulation Ambulation: No Gait Gait: No Stairs / Additional Locomotion Stairs: No Ramp: Not tested (comment) Curb: Not tested (comment) Naval architect Mobility: Yes Wheelchair Assistance: 5: Investment banker, operational: Both upper extremities Wheelchair Parts Management: Supervision/cueing Distance: 150'  Trunk/Postural Assessment  Cervical Assessment Cervical Assessment: Within Functional Limits Thoracic Assessment Thoracic Assessment: Within Functional Limits Lumbar Assessment Lumbar Assessment: Within Functional Limits Postural Control Postural Control: Within Functional Limits  Balance Balance Balance Assessed: Yes Static Sitting Balance Static Sitting - Level of Assistance: 7: Independent Dynamic Sitting Balance Dynamic Sitting - Level of Assistance: 6: Modified independent (Device/Increase time) Static Standing Balance Static Standing - Level of Assistance: 4: Min assist (RW) Dynamic Standing Balance Dynamic Standing - Level of Assistance: Not tested (comment) Extremity Assessment      RLE Assessment RLE Assessment: Exceptions to [facility] RLE Strength RLE Overall Strength: Deficits (no change) LLE Assessment LLE Assessment:  (limited hip extension . pt can extend hip only to midline.)  See FIM for current functional status  Julian Reil 08/06/2011, 1:58 PM

## 2011-08-06 NOTE — Progress Notes (Signed)
ANTICOAGULATION CONSULT NOTE - Follow Up Consult  Pharmacy Consult for Coumadin Indication:   atrial fibrillation  Allergies  Allergen Reactions  . Ancef (Cefazolin) Rash   Patient Measurements: Height: 6' (182.9 cm) Weight: 237 lb 3.4 oz (107.6 kg) IBW/kg (Calculated) : 77.6   Vital Signs: Temp: 98.3 F (36.8 C) (07/29 0504) Temp src: Oral (07/29 0504) BP: 121/63 mmHg (07/29 0504) Pulse Rate: 78  (07/29 0504)  Labs:  Basename 08/06/11 0700 08/05/11 0555 08/04/11 1600 08/04/11 0516  HGB -- -- 9.3* --  HCT -- -- 29.8* --  PLT -- -- 308 --  APTT -- -- -- --  LABPROT 20.9* 21.8* -- 22.5*  INR 1.77* 1.86* -- 1.94*  HEPARINUNFRC -- -- -- --  CREATININE -- -- 7.74* --  CKTOTAL -- -- -- --  CKMB -- -- -- --  TROPONINI -- -- -- --    Estimated Creatinine Clearance: 10.1 ml/min (by C-G formula based on Cr of 7.74).  Assessment: 78 yo male with h/o Afib/flutter on chronic coumadin prior to recent hospitalization on 07/16/11 for ischemic left leg.  His coumadin dose PTA was 2.5mg  every MWFSat and 5mg  every TTSun.   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  His coumadin was resumed on 7/13 (MD dosing) with Lovenox 30mg  SQ q24h bridge (stopped 7/16).  Today the INR has decreased more despite continued dosing.  No evidence of bleeding.    Home regimen:  Warfarin 2.5mg  on MWF and Saturday and 5mg  on TTh and Sunday.  INR was supra-therapeutic on admit at 3.59.  Goal of Therapy:  INR 2-3   Plan:  - Will try putting him back on home dose and adjust according to how he responds.  Start with Warfarin 5mg  x 1 today. - f/up daily INR  Verlene Mayer, PharmD, [place] Pager [phone] 08/06/2011 10:35 AM

## 2011-08-06 NOTE — Progress Notes (Signed)
Physical Therapy Note  Patient Details  Name: Alan [name] MRN: [id number] Date of Birth: 1931-05-07 Today's Date: 08/06/2011  13:15-14:00 individual therapy pt denied pain.  Wife declined practicing real car transfer. Pt was able to perform wc set up with min question cues provided by wife and squat transfer to 26" mat with wife stabilizing wc. wc mobility 150' controlled supervision. Wife states ramp will be built prior to dc.   Julian Reil 08/06/2011, 4:04 PM

## 2011-08-06 NOTE — Progress Notes (Signed)
Patient ID: Alan [name], male   DOB: 1931-08-31, 78 y.o.   MRN: [id number] Subjective/Complaints: Pain in L stump controlled with meds.  Seen during therapy  A 12 point review of systems has been performed and if not noted above is otherwise negative.   Objective: Vital Signs: Blood pressure 121/63, pulse 78, temperature 98.3 F (36.8 C), temperature source Oral, resp. rate 18, height 6' (1.829 m), weight 107.6 kg (237 lb 3.4 oz), SpO2 98.00%. No results found.  Basename 08/04/11 1600  WBC 10.2  HGB 9.3*  HCT 29.8*  PLT 308    Basename 08/04/11 1600  NA 133*  K 3.7  CL 93*  CO2 24  GLUCOSE 204*  BUN 36*  CREATININE 7.74*  CALCIUM 8.7   CBG (last 3)   Basename 08/06/11 0710 08/06/11 0202 08/05/11 2221  GLUCAP 186* 147* 73    Wt Readings from Last 3 Encounters:  08/06/11 107.6 kg (237 lb 3.4 oz)  07/24/11 103.3 kg (227 lb 11.8 oz)  07/24/11 103.3 kg (227 lb 11.8 oz)    Physical Exam:   General:   oriented x 3, No apparent distress-- HEENT: Head is normocephalic, atraumatic, PERRLA, EOMI, sclera anicteric, oral mucosa pink and moist, dentition intact, ext ear canals clear,  Neck: Supple without JVD or lymphadenopathy Heart: IrregReg rate and rhythm. SE murmurs. No  rubs or gallops Chest: CTA bilaterally without wheezes, rales, or rhonchi; no distress Abdomen: Soft, non-tender, non-distended, bowel sounds positive. Extremities: No clubbing, cyanosis, or edema. Pulses are 2+. Dry denuded blisters on 3rd and 4th toes.  Dry ulcer on lateral malleolus.  L-BKA with sutures intact and minimal dry blood on dressing. blanchable erythema with decrease in edema.   Bruising resolving.,erythema confined to mid portion of incision Skin:dressing intact Neuro: Pt is cognitively appropriate with normal insight, memory, and awareness. Cranial nerves 2-12 are intact. Sensory exam is normal except for distal extremities. Reflexes are 2+ in all 4's. Fine motor coordination is intact.  No tremors. Motor function is grossly 5/5 except for LLE which is 3/5.  Musculoskeletal: Full ROM, No pain with AROM or PROM in the neck, trunk, or extremities. Posture appropriate Psych: Pt's affect is appropriate. Pt is cooperative  Assessment/Plan: 1. Functional deficits secondary to left BKA which require 3+ hours per day of interdisciplinary therapy in a comprehensive inpatient rehab setting. Physiatrist is providing close team supervision and 24 hour management of active medical problems listed below. Physiatrist and rehab team continue to assess barriers to discharge/monitor patient progress toward functional and medical goals. FIM: FIM - Bathing Bathing Steps Patient Completed: Chest;Right Arm;Left Arm;Abdomen;Front perineal area;Right upper leg;Left upper leg;Left lower leg (including foot) Bathing: 4: Min-Patient completes 8-9 78f 10 parts or 75+ percent  FIM - Upper Body Dressing/Undressing Upper body dressing/undressing steps patient completed: Thread/unthread right sleeve of pullover shirt/dresss;Thread/unthread left sleeve of pullover shirt/dress;Put head through opening of pull over shirt/dress Upper body dressing/undressing: 5: Set-up assist to: Obtain clothing/put away FIM - Lower Body Dressing/Undressing Lower body dressing/undressing steps patient completed: Thread/unthread right underwear leg;Thread/unthread left underwear leg;Thread/unthread left pants leg;Thread/unthread right pants leg Lower body dressing/undressing: 4: Steadying Assist  FIM - Toileting Toileting steps completed by patient: Adjust clothing prior to toileting;Performs perineal hygiene Toileting Assistive Devices: Grab bar or rail for support Toileting: 3: Mod-Patient completed 2 of 3 steps  FIM - Diplomatic Services operational officer Devices: Psychiatrist Transfers: 4-To toilet/BSC: Min A (steadying Pt. > 75%);4-From toilet/BSC: Min A (steadying Pt. > 75%)  FIM - Physiological scientist Devices: Sliding board Bed/Chair Transfer: 4: Bed > Chair or W/C: Min A (steadying Pt. > 75%);4: Chair or W/C > Bed: Min A (steadying Pt. > 75%)  FIM - Locomotion: Wheelchair Locomotion: Wheelchair: 4: Travels 150 ft or more: maneuvers on rugs and over door sillls with minimal assistance (Pt.>75%) FIM - Locomotion: Ambulation Locomotion: Ambulation: 0: Activity did not occur  Comprehension Comprehension Mode: Auditory Comprehension: 6-Follows complex conversation/direction: With extra time/assistive device  Expression Expression Mode: Verbal Expression: 7-Expresses complex ideas: With no assist  Social Interaction Social Interaction: 6-Interacts appropriately with others with medication or extra time (anti-anxiety, antidepressant).  Problem Solving Problem Solving: 5-Solves complex 90% of the time/cues < 10% of the time  Memory Memory: 5-Recognizes or recalls 90% of the time/requires cueing < 10% of the time Medical Problem List and Plan:  1. DVT Prophylaxis/Anticoagulation: Pharmaceutical: Coumadin  2. Pain Management: Will schedule pain medication prior to therapy schedule. Discussed pain mgt in the setting of a BKA with this patient 3. Mood:  Ego support provided. Continue to monitor.  4. Neuropsych: This patient is capable of making decisions on his/her own behalf.  5. ESRD: Nephrology to assist with HD needs. Continue 3 X week.  7. Acute on chronic anemia: Routine labs with HD. Continue aranesp weekly. last H/H to 9.3. Monitor for  further symptoms    8. PVD both small and large vessel Right toe ulcer/woundcare R Lat malleolus decubitus ulcer: continue silvadene cream daily. PRAFO for protection when in bed.   -left BKA -  increase in erythema on doxy add cipro 9. HTN: monitor with BID checks. Continue lopressor-hold today due to low blood pressure.  10. Dyslipidemia: continue vytorin.  11. DM type 2 with severe neuropathy: Monitor BS  with bid checks-seems to be reasonably controlled at present. Continue Amaryl daily. SSI 12. Constipation:  suppository prn.  13. A Fib: Continue to check HR on bid basis. Continue lopressor. Continue chronic coumadin.  14. CAD with moderate to severe aortic stenosis: monitor for symptoms with increase in activity levels. Continue lopressor, vytorin and coumadin, no signs of failure   LOS (Days) 13 A FACE TO FACE EVALUATION WAS PERFORMED   08/06/2011, 7:58 AM

## 2011-08-07 ENCOUNTER — Inpatient Hospital Stay (HOSPITAL_COMMUNITY): Payer: MEDICARE | Admitting: Physical Therapy

## 2011-08-07 ENCOUNTER — Inpatient Hospital Stay (HOSPITAL_COMMUNITY): Payer: MEDICARE

## 2011-08-07 ENCOUNTER — Inpatient Hospital Stay (HOSPITAL_COMMUNITY): Payer: MEDICARE | Admitting: Occupational Therapy

## 2011-08-07 DIAGNOSIS — S78119A Complete traumatic amputation at level between unspecified hip and knee, initial encounter: Secondary | ICD-10-CM

## 2011-08-07 DIAGNOSIS — I70269 Atherosclerosis of native arteries of extremities with gangrene, unspecified extremity: Secondary | ICD-10-CM

## 2011-08-07 DIAGNOSIS — Z5189 Encounter for other specified aftercare: Secondary | ICD-10-CM

## 2011-08-07 DIAGNOSIS — E1165 Type 2 diabetes mellitus with hyperglycemia: Secondary | ICD-10-CM

## 2011-08-07 LAB — RENAL FUNCTION PANEL
Albumin: 2.8 g/dL — ABNORMAL LOW (ref 3.5–5.2)
BUN: 44 mg/dL — ABNORMAL HIGH (ref 6–23)
CO2: 23 mEq/L (ref 19–32)
Calcium: 8.8 mg/dL (ref 8.4–10.5)
Chloride: 94 mEq/L — ABNORMAL LOW (ref 96–112)
Creatinine, Ser: 8.86 mg/dL — ABNORMAL HIGH (ref 0.50–1.35)
GFR calc Af Amer: 6 mL/min — ABNORMAL LOW (ref >90)
GFR calc non Af Amer: 5 mL/min — ABNORMAL LOW (ref >90)
Glucose, Bld: 139 mg/dL — ABNORMAL HIGH (ref 70–99)
Phosphorus: 5.4 mg/dL — ABNORMAL HIGH (ref 2.3–4.6)
Potassium: 4 mEq/L (ref 3.5–5.1)
Sodium: 134 mEq/L — ABNORMAL LOW (ref 135–145)

## 2011-08-07 LAB — PROTIME-INR
INR: 1.85 — ABNORMAL HIGH (ref 0.00–1.49)
Prothrombin Time: 21.7 seconds — ABNORMAL HIGH (ref 11.6–15.2)

## 2011-08-07 LAB — CBC
HCT: 30.8 % — ABNORMAL LOW (ref 39.0–52.0)
Hemoglobin: 9.6 g/dL — ABNORMAL LOW (ref 13.0–17.0)
MCH: 28.3 pg (ref 26.0–34.0)
MCHC: 31.2 g/dL (ref 30.0–36.0)
MCV: 90.9 fL (ref 78.0–100.0)
Platelets: 257 10*3/uL (ref 150–400)
RBC: 3.39 MIL/uL — ABNORMAL LOW (ref 4.22–5.81)
RDW: 14.8 % (ref 11.5–15.5)
WBC: 9.6 10*3/uL (ref 4.0–10.5)

## 2011-08-07 LAB — GLUCOSE, CAPILLARY: Glucose-Capillary: 189 mg/dL — ABNORMAL HIGH (ref 70–99)

## 2011-08-07 MED ORDER — WARFARIN SODIUM 5 MG PO TABS
5.0000 mg | ORAL_TABLET | Freq: Once | ORAL | Status: AC
  Administered 2011-08-07: 5 mg via ORAL
  Filled 2011-08-07: qty 1

## 2011-08-07 NOTE — Progress Notes (Signed)
Social Work Patient ID: Alan [name], male   DOB: 03/25/1931, 78 y.o.   MRN: [id number] Pt requires a lightweight wheelchair to self propel and uses for self care needs.  He is unable to self propel a standard wheelchair.

## 2011-08-07 NOTE — Progress Notes (Signed)
ANTICOAGULATION CONSULT NOTE - Follow Up Consult  Pharmacy Consult for Coumadin Indication:   atrial fibrillation  Allergies  Allergen Reactions  . Ancef (Cefazolin) Rash   Patient Measurements: Height: 6' (182.9 cm) Weight: 238 lb (107.956 kg) IBW/kg (Calculated) : 77.6   Vital Signs: Temp: 98.3 F (36.8 C) (07/30 0612) Temp src: Oral (07/30 0612) BP: 106/53 mmHg (07/30 0612) Pulse Rate: 76  (07/30 0612)  Labs:  Basename 08/07/11 1610 08/06/11 0700 08/05/11 0555 08/04/11 1600  HGB -- -- -- 9.3*  HCT -- -- -- 29.8*  PLT -- -- -- 308  APTT -- -- -- --  LABPROT 21.7* 20.9* 21.8* --  INR 1.85* 1.77* 1.86* --  HEPARINUNFRC -- -- -- --  CREATININE -- -- -- 7.74*  CKTOTAL -- -- -- --  CKMB -- -- -- --  TROPONINI -- -- -- --    Estimated Creatinine Clearance: 10.2 ml/min (by C-G formula based on Cr of 7.74).  Assessment: 78 yo male with h/o Afib/flutter on chronic coumadin prior to recent hospitalization on 07/16/11 for ischemic left leg.  His coumadin dose PTA was 2.5mg  every MWFSat and 5mg  every TTSun.   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  His coumadin was resumed on 7/13 (MD dosing) with Lovenox 30mg  SQ q24h bridge (stopped 7/16).  Today the INR has started to trend up with higher dose.  No evidence of bleeding.    Home regimen:  Warfarin 2.5mg  on MWF and Saturday and 5mg  on TTh and Sunday.  INR was supra-therapeutic on admit at 3.59.  Goal of Therapy:  INR 2-3   Plan:  - Will try putting him back on home dose and adjust according to how he responds.  Repeat Warfarin 5mg  x 1 today. - f/up daily INR  Verlene Mayer, PharmD, [place] Pager [phone] 08/07/2011 10:52 AM

## 2011-08-07 NOTE — Progress Notes (Signed)
Physical Therapy Note  Patient Details  Name: [name] MRN: [id number] Date of Birth: [birth year] Today's Date: 08/07/2011  0805-0900, 55 min  Pt reports no pain.  Educated pt on use of PRAFO boot in bed, with outrigger to prevent lateral ankle wb'ing.  Bed> w/c slightly down transfer squat pivot with Supervision, for w/c prep and to hold w/c stable.  W/c mobility using bil hands with Theraband on bil rims for increased friction and better steering, x 160' including turns, congested areas, obstacles.  W/c> mat transfer to ht of 26" to R, squat pivot. Mat > w/c downhill squat pivot transfer with supervision. Pt verbalized the need to transfer to R whenever possible; he is aware he and his wife may need to switch sides of the bed at home.  Therapeutic exercise performed with bil LE to increase strength and alignment for functional mobility: bil bridging over bolster, L knee extension with isometric contraction at end range, bil hip IR and adduction; 20 x 1 each.  Bed mobility modified independent, supine> sit by sitting up first, then turning.  Pt has limited carryover of instruction for easier patterns of movement in bed.    Landri Dorsainvil 08/07/2011, 9:00 AM

## 2011-08-07 NOTE — Progress Notes (Signed)
Alan [name] Progress Note  Subjective:  Going home Thursday.  Would like a little snack. No c/o.  Objective Filed Vitals:   08/07/11 1330 08/07/11 1400 08/07/11 1430 08/07/11 1500  BP: 106/50 107/55 114/56 103/52  Pulse: 78 80 81 76  Temp:      TempSrc:      Resp: 18 18 20 18   Height:      Weight:      SpO2:       Physical Exam General: on HD, NAD Heart: RRR II/VI murmur Lungs: no wheezes or rales Abdomen: obese soft Extremities: left BKA no edema; right LE in boot Dialysis Access:  Dialysis Prescription: 4 1/4hr, EDW 108.5, QB 500, QD AF 1.5, 2K/2.25Ca. RUA AVF 15G needles. Heparin 3400 Units. Zemplar TIW, No epogen. Venofer 100mg  IV Q 2weekly.  Assessment/Plan  1. PVD with ulceration- S/P Left BKA, 07/21/11 [facility]-  on Rehab with PT/OT.  2. ESRD: HD TTS SWAF; Will need new EDW at time of discharge; goal 3.5 today 3. Anemia: hgb gradually increasing; cont Aranesp (restarted 07/21/11); same wkly IV Fe 4. Metabolic Bone disease- Ca 8.8, phos 5.4; controlled on CaCO3 and Zemplar 4 TIW;  5. Hypertension- controlled on meds (metoprolol 12.5 bid- dose lowered this admission) and UF with HD 6. DM/HLD- Vytorin, Amaryl; CBG and SSI; HgbA1c 6.3%; glucose 124 this am; primary following 7. Nutrition- albumin  2.8; cont ^ protein renal carb modified diet; suppl prn 8. Afib/flutter-remains on Coumadin with INR stable 1.85- pharmacy dosing; on metoprolol 12.5 bid 9. Hemorrhoids/Constipation - improving on anusol HC - daily miralax; encouraged to take qhs to prevent worries having BM on HD 10. Gout- no problems on allopurinol 11. Disposition- per rehab with anticipated discharge next Thu 08/09/11 after HD Sheffield Slider, PA-C [name] Beeper [phone]  08/07/2011,3:20 PM  LOS: 14 days   Patient seen and examined and agree with assessment and plan as above.  Alan Moselle  MD [name] [phone] pgr    [phone]  cell 08/07/2011, 3:36 PM     Additional Objective Labs: Basic Metabolic Panel:  Lab 08/07/11 2130 08/04/11 1600 08/02/11 1553  NA 134* 133* 132*  K 4.0 3.7 4.1  CL 94* 93* 93*  CO2 23 24 23   GLUCOSE 139* 204* 103*  BUN 44* 36* 36*  CREATININE 8.86* 7.74* 8.42*  CALCIUM 8.8 8.7 8.7  ALB -- -- --  PHOS 5.4* 4.8* 4.5   Liver Function Tests:  Lab 08/07/11 1332 08/04/11 1600 08/02/11 1553  AST -- -- --  ALT -- -- --  ALKPHOS -- -- --  BILITOT -- -- --  PROT -- -- --  ALBUMIN 2.8* 2.8* 2.8*   CBC:  Lab 08/07/11 1333 08/04/11 1600 08/02/11 1553 07/31/11 1611  WBC 9.6 10.2 9.8 --  NEUTROABS -- -- -- --  HGB 9.6* 9.3* 9.0* --  HCT 30.8* 29.8* 28.2* --  MCV 90.9 91.7 91.3 91.1  PLT 257 308 316 --   BCBG:  Lab 08/07/11 1122 08/07/11 0710 08/06/11 2148 08/06/11 1611 08/06/11 1130  GLUCAP 157* 119* 125* 160* 148*   Lab Results  Component Value Date   INR 1.85* 08/07/2011   INR 1.77* 08/06/2011   INR 1.86* 08/05/2011  Medications:      . allopurinol  150 mg Oral Daily  . aspirin EC  81 mg Oral Daily  . bacitracin   Topical Daily  . calcium carbonate  1 tablet Oral TID AC  .  ciprofloxacin  250 mg Oral Q breakfast  . darbepoetin (ARANESP) injection - DIALYSIS  150 mcg Intravenous Q Sat-HD  . doxycycline  100 mg Oral Q12H  . ezetimibe-simvastatin  1 tablet Oral QHS  . feeding supplement  1 Container Oral Q24H  . ferric gluconate (FERRLECIT/NULECIT) IV  62.5 mg Intravenous Q Thu-HD  . gabapentin  100 mg Oral QHS  . glimepiride  2 mg Oral QAC breakfast  . hydrocortisone  25 mg Rectal BID  . insulin aspart  0-15 Units Subcutaneous TID WC  . metoprolol tartrate  12.5 mg Oral BID  . oxyCODONE  5 mg Oral BID  . pantoprazole  40 mg Oral Q1200  . paricalcitol  4 mcg Intravenous Q T,Th,Sa-HD  . senna-docusate  2 tablet Oral BID  . warfarin  5 mg Oral ONCE-1800  . warfarin  5 mg Oral ONCE-1800  . Warfarin - Pharmacist Dosing Inpatient   Does not apply [phone]

## 2011-08-07 NOTE — Progress Notes (Signed)
Occupational Therapy Session Note  Patient Details  Name: Alan [name] MRN: [id number] Date of Birth: Dec 06, 1931  Today's Date: 08/07/2011 Time: 1000-1100 Time Calculation (min): 60 min  Short Term Goals: Week 1:  OT Short Term Goal 1 (Week 1): Pt will transfer to [facility] with steady assist. OT Short Term Goal 1 - Progress (Week 1): Met OT Short Term Goal 2 (Week 1): Pt will be able to pull pants down for toileting with min assist. OT Short Term Goal 2 - Progress (Week 1): Progressing toward goal OT Short Term Goal 3 (Week 1): Pt will be able to pull pants up with mod assist. OT Short Term Goal 3 - Progress (Week 1): Progressing toward goal OT Short Term Goal 4 (Week 1): Pt will bathe with supervision. OT Short Term Goal 4 - Progress (Week 1): Progressing toward goal Week 2:  OT Short Term Goal 1 (Week 2): Pt will transfer to [facility] with supervision. OT Short Term Goal 2 (Week 2): Pt will be able to remove pants for toileting with min assist. OT Short Term Goal 3 (Week 2): Pt will be able to pull up pants post toileting with mod assist. OT Short Term Goal 4 (Week 2): Pt will be able to don clothing from bed level.  Skilled Therapeutic Interventions/Progress Updates: Pt seen for ADL transfer training from wheelchair to bed to prepare for bathing and LB dressing. Pt's bed at home is high and pt will need to transfer to right side.  Recommended to wife that pt's son assist him the first few times at home to ensure the transfer goes smoothly.  Also recommended a low profile box spring.  Pt worked on lateral leans for Acupuncturist.  From w/c, transferred to [facility] adjacent to bed. Pt able to don/doff pants from [facility] with lateral leans.  Pt worked on UE HEP with theraband.     Therapy Documentation Precautions:  Precautions Precautions: Fall Required Braces or Orthoses: Other Brace/Splint Other Brace/Splint: PRAFO rt foot at all times Restrictions Weight Bearing Restrictions: Yes LLE  Weight Bearing: Non weight bearing Other Position/Activity Restrictions: BKA on left.   Pain: Pain Assessment Pain Assessment: No/denies pain  See FIM for current functional status  Therapy/Group: Individual Therapy  Taiwan Millon 08/07/2011, 12:31 PM

## 2011-08-07 NOTE — Progress Notes (Signed)
Nutrition Follow-up  Intervention:   1. Continue Breeze supplements to help meet nutrition needs 2. Encourage variety of meals 3. Recommend adding Rena-Vite daily  4. RD to continue to follow nutrition care plan  Assessment:   Pt states that his appetite is doing well. Enjoys Nurse, adult. Asking this RD to review meal choices for lunch and dinner. This RD called [facility] and put new choices in place to help allow pt to have favorites (collard greens and asparagus) with meals.  Diet Order:  Renal 80 - 90; 1200 ml fluid restriction Supplement: Resource Breeze PO daily  Meds: Scheduled Meds:   . allopurinol  150 mg Oral Daily  . aspirin EC  81 mg Oral Daily  . bacitracin   Topical Daily  . calcium carbonate  1 tablet Oral TID AC  . ciprofloxacin  250 mg Oral Q breakfast  . darbepoetin (ARANESP) injection - DIALYSIS  150 mcg Intravenous Q Sat-HD  . doxycycline  100 mg Oral Q12H  . ezetimibe-simvastatin  1 tablet Oral QHS  . feeding supplement  1 Container Oral Q24H  . ferric gluconate (FERRLECIT/NULECIT) IV  62.5 mg Intravenous Q Thu-HD  . gabapentin  100 mg Oral QHS  . glimepiride  2 mg Oral QAC breakfast  . hydrocortisone  25 mg Rectal BID  . insulin aspart  0-15 Units Subcutaneous TID WC  . metoprolol tartrate  12.5 mg Oral BID  . oxyCODONE  5 mg Oral BID  . pantoprazole  40 mg Oral Q1200  . paricalcitol  4 mcg Intravenous Q T,Th,Sa-HD  . senna-docusate  2 tablet Oral BID  . warfarin  5 mg Oral ONCE-1800  . Warfarin - Pharmacist Dosing Inpatient   Does not apply q1800   Continuous Infusions:  PRN Meds:.acetaminophen, acetaminophen, bisacodyl, calcium carbonate (dosed in mg elemental calcium), camphor-menthol, diphenhydrAMINE, docusate sodium, feeding supplement (NEPRO CARB STEADY), guaiFENesin-dextromethorphan, heparin, hydrOXYzine, lidocaine, lidocaine-prilocaine, methocarbamol, ondansetron (ZOFRAN) IV, ondansetron, oxyCODONE, pentafluoroprop-tetrafluoroeth,  phenol, sorbitol, traMADol, traZODone, zolpidem, DISCONTD: heparin  Labs:  CMP     Component Value Date/Time   NA 133* 08/04/2011 1600   K 3.7 08/04/2011 1600   CL 93* 08/04/2011 1600   CO2 24 08/04/2011 1600   GLUCOSE 204* 08/04/2011 1600   BUN 36* 08/04/2011 1600   CREATININE 7.74* 08/04/2011 1600   CALCIUM 8.7 08/04/2011 1600   CALCIUM 8.8 10/27/2010 0734   PROT 6.8 05/18/2011 1547   ALBUMIN 2.8* 08/04/2011 1600   AST 45* 05/18/2011 1547   ALT 10 05/18/2011 1547   ALKPHOS 77 05/18/2011 1547   BILITOT 0.3 05/18/2011 1547   GFRNONAA 6* 08/04/2011 1600   GFRAA 7* 08/04/2011 1600   Phosphorus  Date/Time Value Range Status  08/04/2011  4:00 PM 4.8* 2.3 - 4.6 mg/dL Final     Intake/Output Summary (Last 24 hours) at 08/07/11 0949 Last data filed at 08/07/11 0855  Gross per 24 hour  Intake    464 ml  Output      0 ml  Net    464 ml  BM on 7/28  Weight Status:  108 kg s/p HD on 7/30 107.3 kg s/p HD on 7/27  Estimated needs:  2000 - 2200 kcal, 90 - 100 grams protein  Nutrition Dx:  Inadequate protein-energy intake r/t food choices AEB pt report. Improving.  Goal: Pt to meet >/= 90% of their estimated nutrition needs;  met  Monitor:  Weights, labs, PO Intake, I/O's   Alan Motto MS, RD, LDN Pager: [phone]  After-hours pager: [phone]

## 2011-08-07 NOTE — Progress Notes (Signed)
Patient ID: Alan [name], male   DOB: [birth year], 78 y.o.   MRN: [id number] Subjective/Complaints: Pain in L stump controlled with meds.  Asking when staples will be d/c ed  A 12 point review of systems has been performed and if not noted above is otherwise negative.   Objective: Vital Signs: Blood pressure 106/53, pulse 76, temperature 98.3 F (36.8 C), temperature source Oral, resp. rate 18, height 6' (1.829 m), weight 107.956 kg (238 lb), SpO2 94.00%. No results found.  Basename 08/04/11 1600  WBC 10.2  HGB 9.3*  HCT 29.8*  PLT 308    Basename 08/04/11 1600  NA 133*  K 3.7  CL 93*  CO2 24  GLUCOSE 204*  BUN 36*  CREATININE 7.74*  CALCIUM 8.7   CBG (last 3)   Basename 08/07/11 0710 08/06/11 2148 08/06/11 1611  GLUCAP 119* 125* 160*    Wt Readings from Last 3 Encounters:  08/07/11 107.956 kg (238 lb)  07/24/11 103.3 kg (227 lb 11.8 oz)  07/24/11 103.3 kg (227 lb 11.8 oz)    Physical Exam:   General:   oriented x 3, No apparent distress-- HEENT: Head is normocephalic, atraumatic, PERRLA, EOMI, sclera anicteric, oral mucosa pink and moist, dentition intact, ext ear canals clear,  Neck: Supple without JVD or lymphadenopathy Heart: IrregReg rate and rhythm. SE murmurs. No  rubs or gallops Chest: CTA bilaterally without wheezes, rales, or rhonchi; no distress Abdomen: Soft, non-tender, non-distended, bowel sounds positive. Extremities: No clubbing, cyanosis, or edema. Pulses are 2+. Dry denuded blisters on 3rd and 4th toes.  Dry ulcer on lateral malleolus.  L-BKA with sutures intact and minimal dry blood on dressing. blanchable erythema with decrease in edema.   Bruising resolving.,erythema confined to mid portion of incision Skin:dressing intact Neuro: Pt is cognitively appropriate with normal insight, memory, and awareness. Cranial nerves 2-12 are intact. Sensory exam is normal except for distal extremities. Reflexes are 2+ in all 4's. Fine motor coordination is  intact. No tremors. Motor function is grossly 5/5 except for LLE which is 3/5.  Musculoskeletal: Full ROM, No pain with AROM or PROM in the neck, trunk, or extremities. Posture appropriate Psych: Pt's affect is appropriate. Pt is cooperative  Assessment/Plan: 1. Functional deficits secondary to left BKA which require 3+ hours per day of interdisciplinary therapy in a comprehensive inpatient rehab setting. Physiatrist is providing close team supervision and 24 hour management of active medical problems listed below. Physiatrist and rehab team continue to assess barriers to discharge/monitor patient progress toward functional and medical goals. FIM: FIM - Bathing Bathing Steps Patient Completed: Chest;Right Arm;Left Arm;Abdomen;Front perineal area;Right lower leg (including foot);Left upper leg;Right upper leg (8 out of 9 body parts) Bathing: 4: Min-Patient completes 8-9 58f 10 parts or 75+ percent  FIM - Upper Body Dressing/Undressing Upper body dressing/undressing steps patient completed: Thread/unthread right sleeve of pullover shirt/dresss;Thread/unthread left sleeve of pullover shirt/dress;Put head through opening of pull over shirt/dress Upper body dressing/undressing: 5: Set-up assist to: Obtain clothing/put away FIM - Lower Body Dressing/Undressing Lower body dressing/undressing steps patient completed: Thread/unthread right pants leg;Thread/unthread left pants leg Lower body dressing/undressing: 4: Min-Patient completed 75 plus % of tasks  FIM - Toileting Toileting steps completed by patient: Adjust clothing prior to toileting;Performs perineal hygiene Toileting Assistive Devices: Grab bar or rail for support Toileting: 3: Mod-Patient completed 2 of 3 steps  FIM - Diplomatic Services operational officer Devices: Psychiatrist Transfers: 5-To toilet/BSC: Supervision (verbal cues/safety issues);5-From toilet/BSC: Supervision (verbal cues/safety issues)  FIM - Physiological scientist Devices: Sliding board Bed/Chair Transfer: 5: Bed > Chair or W/C: Supervision (verbal cues/safety issues);5: Chair or W/C > Bed: Supervision (verbal cues/safety issues)  FIM - Locomotion: Wheelchair Distance: 150' Locomotion: Wheelchair: 1: Travels less than 50 ft with supervision, cueing or coaxing FIM - Locomotion: Ambulation Locomotion: Ambulation: 0: Activity did not occur  Comprehension Comprehension Mode: Auditory Comprehension: 6-Follows complex conversation/direction: With extra time/assistive device  Expression Expression Mode: Verbal Expression: 7-Expresses complex ideas: With no assist  Social Interaction Social Interaction: 6-Interacts appropriately with others with medication or extra time (anti-anxiety, antidepressant).  Problem Solving Problem Solving: 5-Solves complex 90% of the time/cues < 10% of the time  Memory Memory: 5-Recognizes or recalls 90% of the time/requires cueing < 10% of the time Medical Problem List and Plan:  1. DVT Prophylaxis/Anticoagulation: Pharmaceutical: Coumadin  2. Pain Management: Will schedule pain medication prior to therapy schedule. Discussed pain mgt in the setting of a BKA with this patient 3. Mood:  Ego support provided. Continue to monitor.  4. Neuropsych: This patient is capable of making decisions on his/her own behalf.  5. ESRD: Nephrology to assist with HD needs. Continue 3 X week.  7. Acute on chronic anemia: Routine labs with HD. Continue aranesp weekly. last H/H to 9.3. Monitor for  further symptoms    8. PVD both small and large vessel Right toe ulcer/woundcare R Lat malleolus decubitus ulcer: continue silvadene cream daily. PRAFO for protection when in bed.   -left BKA -  increase in erythema on doxy add cipro 9. HTN: monitor with BID checks. Continue lopressor-hold today due to low blood pressure.  10. Dyslipidemia: continue vytorin.  11. DM type 2 with severe neuropathy: Monitor BS  with bid checks-seems to be reasonably controlled at present. Continue Amaryl daily. SSI 12. Constipation:  suppository prn.  13. A Fib: Continue to check HR on bid basis. Continue lopressor. Continue chronic coumadin.  14. CAD with moderate to severe aortic stenosis: monitor for symptoms with increase in activity levels. Continue lopressor, vytorin and coumadin, no signs of failure   LOS (Days) 14 A FACE TO FACE EVALUATION WAS PERFORMED   08/07/2011, 7:35 AM

## 2011-08-07 NOTE — Progress Notes (Signed)
Physical Therapy Session Note  Patient Details  Name: Alan [name] MRN: [id number] Date of Birth: 06/09/1931  Today's Date: 08/07/2011 Time: 0805-0900 Time Calculation (min): 55 min  Short Term Goals: Week 1:  PT Short Term Goal 1 (Week 1): Same as LTGs  Skilled Therapeutic Interventions/Progress Updates:    pt notes back soreness.  Pt indicates wanting to do UE there ex.  W/C mobility with S 130' and 50'.  Cues for sharp turns.  Squat pivot to mat table Supervision with cues for using Bil brakes.  Seated UE there ex with 2# wts shoulder press, triceps,bicep curls x 20reps Bil.  Return to W/C with Supervision.    Therapy Documentation Precautions:  Precautions Precautions: Fall Required Braces or Orthoses: Other Brace/Splint Other Brace/Splint: PRAFO rt foot at all times Restrictions Weight Bearing Restrictions: Yes LLE Weight Bearing: Non weight bearing Other Position/Activity Restrictions: BKA on left.  See FIM for current functional status  Therapy/Group: Individual Therapy  Shanyiah Conde, Alison Murray 08/07/2011, 12:14 PM

## 2011-08-08 ENCOUNTER — Inpatient Hospital Stay (HOSPITAL_COMMUNITY): Payer: MEDICARE | Admitting: Occupational Therapy

## 2011-08-08 ENCOUNTER — Inpatient Hospital Stay (HOSPITAL_COMMUNITY): Payer: MEDICARE | Admitting: Physical Therapy

## 2011-08-08 LAB — PROTIME-INR: INR: 2.27 — ABNORMAL HIGH (ref 0.00–1.49)

## 2011-08-08 LAB — GLUCOSE, CAPILLARY
Glucose-Capillary: 128 mg/dL — ABNORMAL HIGH (ref 70–99)
Glucose-Capillary: 167 mg/dL — ABNORMAL HIGH (ref 70–99)

## 2011-08-08 MED ORDER — METOPROLOL TARTRATE 25 MG PO TABS: 12.5000 mg | ORAL_TABLET | Freq: Two times a day (BID) | ORAL | Status: DC

## 2011-08-08 MED ORDER — HEPARIN SODIUM (PORCINE) 1000 UNIT/ML DIALYSIS: 20.0000 [IU]/kg | INTRAMUSCULAR | Status: DC | PRN

## 2011-08-08 MED ORDER — WARFARIN SODIUM 5 MG PO TABS: ORAL_TABLET | ORAL | Status: DC

## 2011-08-08 MED ORDER — DOXYCYCLINE HYCLATE 100 MG PO TABS
100.0000 mg | ORAL_TABLET | Freq: Two times a day (BID) | ORAL | Status: AC
  Administered 2011-08-08: 100 mg via ORAL
  Filled 2011-08-08: qty 1

## 2011-08-08 MED ORDER — WARFARIN SODIUM 3 MG PO TABS
3.0000 mg | ORAL_TABLET | Freq: Every day | ORAL | Status: DC
  Administered 2011-08-08: 3 mg via ORAL
  Filled 2011-08-08 (×2): qty 1

## 2011-08-08 MED ORDER — GABAPENTIN 100 MG PO CAPS: 100.0000 mg | ORAL_CAPSULE | Freq: Every day | ORAL | Status: DC

## 2011-08-08 MED ORDER — OXYCODONE HCL 5 MG PO TABS: 5.0000 mg | ORAL_TABLET | ORAL | Status: AC | PRN

## 2011-08-08 MED ORDER — CIPROFLOXACIN HCL 250 MG PO TABS: 250.0000 mg | ORAL_TABLET | Freq: Every day | ORAL | Status: AC

## 2011-08-08 NOTE — Progress Notes (Signed)
Physical Therapy Note  Patient Details  Name: Alan [name] MRN: [id number] Date of Birth: [birth year] Today's Date: 08/08/2011  9:20-10:00 individual therapy pt denied pain  wc mobility 150' supervision, pt needs assist to set up wc and to don legrest. Pt only required 1 cue to remove armrest for wc set up for transfers. Performed sliding board transfer to mat supervision and squat transfer to chair with armrest to rt. With supervision with vc for hand placement. Pt required assist to get out of low chair.   Julian Reil 08/08/2011, 10:58 AM

## 2011-08-08 NOTE — Progress Notes (Signed)
ANTICOAGULATION CONSULT NOTE - Follow Up Consult  Pharmacy Consult for Coumadin Indication:   atrial fibrillation  Allergies  Allergen Reactions  . Ancef (Cefazolin) Rash   Patient Measurements: Height: 6' (182.9 cm) Weight: 236 lb 5.3 oz (107.2 kg) IBW/kg (Calculated) : 77.6   Vital Signs: Temp: 98.1 F (36.7 C) (07/31 0512) Temp src: Oral (07/31 0512) BP: 96/59 mmHg (07/31 0905) Pulse Rate: 89  (07/31 0905)  Labs:  Basename 08/08/11 0655 08/07/11 1333 08/07/11 1332 08/07/11 0623 08/06/11 0700  HGB -- 9.6* -- -- --  HCT -- 30.8* -- -- --  PLT -- 257 -- -- --  APTT -- -- -- -- --  LABPROT 25.4* -- -- 21.7* 20.9*  INR 2.27* -- -- 1.85* 1.77*  HEPARINUNFRC -- -- -- -- --  CREATININE -- -- 8.86* -- --  CKTOTAL -- -- -- -- --  CKMB -- -- -- -- --  TROPONINI -- -- -- -- --    Estimated Creatinine Clearance: 8.8 ml/min (by C-G formula based on Cr of 8.86).  Assessment: 78 yo male with h/o Afib/flutter on chronic coumadin prior to recent hospitalization on 07/16/11 for ischemic left leg.  His coumadin dose PTA was 2.5mg  every MWFSat and 5mg  every TTSun.   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  His coumadin was resumed on 7/13 (MD dosing) with Lovenox 30mg  SQ q24h bridge (stopped 7/16).  Today the INR has started to trend up with higher dose and is now therapeutic. Will attempt to place patient on a daily regimen. No evidence of bleeding.    Home regimen:  Warfarin 2.5mg  on MWF and Saturday and 5mg  on TTh and Sunday.  INR was supra-therapeutic on admit at 3.59.  Goal of Therapy:  INR 2-3   Plan:  - Coumadin 3mg  daily - f/up daily INR  Verlene Mayer, PharmD, BCPS Pager [phone] 08/08/2011 10:12 AM

## 2011-08-08 NOTE — Progress Notes (Signed)
Patient ID: Alan [name], male   DOB: [birth year], 78 y.o.   MRN: [id number] Subjective/Complaints: Pain in L stump controlled with meds.  Endurance and outlook much improved.  No c/o dizziness this am.  A 12 point review of systems has been performed and if not noted above is otherwise negative.   Objective: Vital Signs: Blood pressure 93/52, pulse 69, temperature 98.1 F (36.7 C), temperature source Oral, resp. rate 18, height 6' (1.829 m), weight 107.2 kg (236 lb 5.3 oz), SpO2 97.00%. No results found.  Basename 08/07/11 1333  WBC 9.6  HGB 9.6*  HCT 30.8*  PLT 257    Basename 08/07/11 1332  NA 134*  K 4.0  CL 94*  CO2 23  GLUCOSE 139*  BUN 44*  CREATININE 8.86*  CALCIUM 8.8   CBG (last 3)   Basename 08/08/11 0717 08/07/11 2107 08/07/11 1810  GLUCAP 128* 189* 105*    Wt Readings from Last 3 Encounters:  08/08/11 107.2 kg (236 lb 5.3 oz)  07/24/11 103.3 kg (227 lb 11.8 oz)  07/24/11 103.3 kg (227 lb 11.8 oz)    Physical Exam:   General:   oriented x 3, No apparent distress-- HEENT: Head is normocephalic, atraumatic, PERRLA, EOMI, sclera anicteric, oral mucosa pink and moist, dentition intact, ext ear canals clear,  Neck: Supple without JVD or lymphadenopathy Heart: IrregReg rate and rhythm. SE murmurs. No  rubs or gallops Chest: CTA bilaterally without wheezes, rales, or rhonchi; no distress Abdomen: Soft, non-tender, non-distended, bowel sounds positive. Extremities: No clubbing, cyanosis, or edema. Pulses are 2+. Dry denuded blisters on 4rd toe healed.  3rd toe denuded area dry without drainage.  Dry ulcer on lateral malleolus.  L-BKA with sutures intact and minimal dry blood on dressing.   Bruising resolving.,erythema confined to mid portion of incision. Edema much improved. Continues with hypersensitivity at amp site.  Neuro: Pt is cognitively appropriate with normal insight, memory, and awareness. Cranial nerves 2-12 are intact. Sensory exam is normal  except for distal extremities. Reflexes are 2+ in all 4's. Fine motor coordination is intact. No tremors. Motor function is grossly 5/5 except for LLE which is 3/5.  Musculoskeletal: Full ROM, No pain with AROM or PROM in the neck, trunk, or extremities. Posture appropriate Psych: Pt's affect is appropriate. Pt is cooperative bout of irritation resolved.   Assessment/Plan: 1. Functional deficits secondary to left BKA which require 3+ hours per day of interdisciplinary therapy in a comprehensive inpatient rehab setting. Physiatrist is providing close team supervision and 24 hour management of active medical problems listed below. Physiatrist and rehab team continue to assess barriers to discharge/monitor patient progress toward functional and medical goals. Team conference FIM: FIM - Bathing Bathing Steps Patient Completed: Chest;Right Arm;Left Arm;Abdomen;Front perineal area;Right lower leg (including foot);Left upper leg;Right upper leg (8 out of 9 body parts) Bathing: 4: Min-Patient completes 8-9 34f 10 parts or 75+ percent  FIM - Upper Body Dressing/Undressing Upper body dressing/undressing steps patient completed: Thread/unthread right sleeve of pullover shirt/dresss;Thread/unthread left sleeve of pullover shirt/dress;Put head through opening of pull over shirt/dress Upper body dressing/undressing: 5: Set-up assist to: Obtain clothing/put away FIM - Lower Body Dressing/Undressing Lower body dressing/undressing steps patient completed: Thread/unthread right pants leg;Thread/unthread left pants leg Lower body dressing/undressing: 4: Min-Patient completed 75 plus % of tasks  FIM - Toileting Toileting steps completed by patient: Adjust clothing prior to toileting;Performs perineal hygiene;Adjust clothing after toileting Toileting Assistive Devices: Grab bar or rail for support Toileting: 5: Supervision: Safety issues/verbal  cues  FIM - Diplomatic Services operational officer Devices:  Bedside commode Toilet Transfers: 5-To toilet/BSC: Supervision (verbal cues/safety issues);5-From toilet/BSC: Supervision (verbal cues/safety issues)  FIM - Press photographer Assistive Devices: Arm rests Bed/Chair Transfer: 5: Chair or W/C > Bed: Supervision (verbal cues/safety issues);5: Bed > Chair or W/C: Supervision (verbal cues/safety issues)  FIM - Locomotion: Wheelchair Distance: 150' Locomotion: Wheelchair: 5: Travels 150 ft or more: maneuvers on rugs and over door sills with supervision, cueing or coaxing (rims wrapped with Theraband) FIM - Locomotion: Ambulation Locomotion: Ambulation: 0: Activity did not occur  Comprehension Comprehension Mode: Auditory Comprehension: 6-Follows complex conversation/direction: With extra time/assistive device  Expression Expression Mode: Verbal Expression: 6-Expresses complex ideas: With extra time/assistive device  Social Interaction Social Interaction: 6-Interacts appropriately with others with medication or extra time (anti-anxiety, antidepressant).  Problem Solving Problem Solving: 5-Solves complex 90% of the time/cues < 10% of the time  Memory Memory: 5-Recognizes or recalls 90% of the time/requires cueing < 10% of the time Medical Problem List and Plan:  1. DVT Prophylaxis/Anticoagulation: Pharmaceutical: Coumadin  2. Pain Management: Will schedule pain medication prior to therapy schedule. Discussed pain mgt in the setting of a BKA with this patient 3. Mood:  Ego support provided. Continue to monitor.  4. Neuropsych: This patient is capable of making decisions on his/her own behalf.  5. ESRD: Nephrology to assist with HD needs. Continue 3 X week.  7. Acute on chronic anemia: Routine labs with HD. Continue aranesp weekly. last H/H to 9.3. Monitor for  further symptoms    8. PVD both small and large vessel Right toe ulcer/woundcare R Lat malleolus decubitus ulcer: continue silvadene cream daily. PRAFO for  protection when in bed.   -left BKA -  decrease in erythema continue cipro for a week. Doxycycline D# 7/7.will d/c 9. HTN: monitor with BID checks. Continue lopressor-hold today due to low blood pressure.  10. Dyslipidemia: continue vytorin.  11. DM type 2 with severe neuropathy: Monitor BS with bid checks-seems to be reasonably controlled at present. Continue Amaryl daily. SSI 12. Constipation:  suppository prn.  13. A Fib: Continue to check HR on bid basis. Continue lopressor--hold for SBP<100.  Continue chronic coumadin.  14. CAD with moderate to severe aortic stenosis: monitor for symptoms with increase in activity levels. Continue lopressor, vytorin and coumadin, no signs of failure   LOS (Days) 15 A FACE TO FACE EVALUATION WAS PERFORMED   08/08/2011, 8:08 AM

## 2011-08-08 NOTE — Progress Notes (Signed)
Social Work Patient ID: Alan [name], male   DOB: [birth year], 78 y.o.   MRN: [id number] Met with pt and wife to confirm discharge tomorrow and have met his rehab goals.  Aware team conference was today and Family education is completed.  DME delivered today and follow up arranged.  Pt feels ready to go home but wished he was more ambulatory. Wife feels comfortable with his care and is ready, she will have help from her son also.  Set for discharge tomorrow, RN aware to call HD to take early am. Set for discharge tomorrow.

## 2011-08-08 NOTE — Patient Care Conference (Signed)
Inpatient RehabilitationTeam Conference Note Date: 08/08/2011   Time: 11;25 am    Patient Name: Alan [name]      Medical Record Number: [id number]  Date of Birth: June 13, 1931 Sex: Male         Room/Bed: 4039/4039-01 Payor Info: Payor: MEDICARE RAILROAD  Plan: MEDICARE RAILROAD  Product Type: *No Product type*     Admitting Diagnosis: LT BKA  Admit Date/Time:  07/24/2011  4:11 PM Admission Comments: No comment available   Primary Diagnosis:  Unilateral complete BKA Principal Problem: Unilateral complete BKA  Patient Active Problem List   Diagnosis Date Noted  . Unilateral complete BKA-right 07/25/2011  . Atherosclerosis of native arteries of the extremities with ulceration 06/18/2011  . Diabetes mellitus 05/04/2011  . Chronic anticoagulation 05/04/2011  . Hypotension 05/04/2011  . Sepsis 05/03/2011  . Toxic metabolic encephalopathy 05/03/2011  . Aortic stenosis 11/22/2010  . CAD (coronary artery disease) 11/22/2010  . End stage renal disease on dialysis 11/22/2010  . Campath-induced atrial fibrillation 11/16/2010  . Encounter for long-term (current) use of anticoagulants 11/16/2010    Expected Discharge Date: Expected Discharge Date: 08/09/11  Team Members Present: Physician: Dr. Claudette Laws Social Worker Present: Dossie Der, LCSW Nurse Present: Other (comment) Milly Jakob) PT Present: Edman Circle, PT;Becky Gretta Arab, [place] OT Present: Leonette Monarch, OT SLP Present: Fae Pippin, SLP     Current Status/Progress Goal Weekly Team Focus  Medical   left BKA, poor endurance, end-stage renal disease  improve mobility to allow home environment  discharge planning   Bowel/Bladder   LBM 08/07/11  remain continent of bowel and bladder  Administer stool softner per MD order   Swallow/Nutrition/ Hydration             ADL's   LTGs met - family ed completed 08/07/11 with wife only.  min with toileting using lateral leans  supervision/set up bed level LB  dressing, bathing; toilet transfer to [facility] supervision; min assist toileting  Review of HEP, reinforcement of family ed with wife   Mobility   supervision overall wc, squat or sliding board transfers     family ed.   Communication             Safety/Cognition/ Behavioral Observations            Pain   pain at 6  less than 2  remain free of pain   Skin   Staples to Left  BKA with kerlex and ace wrap, baciticin applied to 2nd and 3rd toe, echymosis on  sacrum open to air  free of infection and applied medication as ordered  remain free of infection and other skin issues.      *See Interdisciplinary Assessment and Plan and progress notes for long and short-term goals  Barriers to Discharge: needs dialysis prior to discharge    Possible Resolutions to Barriers:  discharge after dialysis in a.m.    Discharge Planning/Teaching Needs:  Family education completed with wife, step-son to also assit with his care.  Pt feels ready to go home.      Team Discussion:  Reaching goals and ready for discharge.  Cipro few more days, wounds look good.  Medically ready for discharge tomorrow  Revisions to Treatment Plan:  None   Continued Need for Acute Rehabilitation Level of Care: The patient requires daily medical management by a physician with specialized training in physical medicine and rehabilitation for the following conditions: Daily direction of a multidisciplinary physical rehabilitation program to ensure safe treatment  while eliciting the highest outcome that is of practical value to the patient.: Yes Daily medical management of patient stability for increased activity during participation in an intensive rehabilitation regime.: Yes Daily analysis of laboratory values and/or radiology reports with any subsequent need for medication adjustment of medical intervention for : Post surgical problems  [name], Lemar Livings 08/08/2011, 12:11 PM

## 2011-08-08 NOTE — Progress Notes (Signed)
Occupational Therapy Session Note  Patient Details  Name: CARLO GUEVARRA MRN: [id number] Date of Birth: 1931-04-03  Today's Date: 08/08/2011 Time: 0805-0900 and 1110-1140 Time Calculation (min): 55 min and 30 minutes  Short Term Goals: Week 1:  OT Short Term Goal 1 (Week 1): Pt will transfer to [facility] with steady assist. OT Short Term Goal 1 - Progress (Week 1): Met OT Short Term Goal 2 (Week 1): Pt will be able to pull pants down for toileting with min assist. OT Short Term Goal 2 - Progress (Week 1): Progressing toward goal OT Short Term Goal 3 (Week 1): Pt will be able to pull pants up with mod assist. OT Short Term Goal 3 - Progress (Week 1): Progressing toward goal OT Short Term Goal 4 (Week 1): Pt will bathe with supervision. OT Short Term Goal 4 - Progress (Week 1): Progressing toward goal Week 2:  OT Short Term Goal 1 (Week 2): Pt will transfer to [facility] with supervision. OT Short Term Goal 1 - Progress (Week 2): Met OT Short Term Goal 2 (Week 2): Pt will be able to remove pants for toileting with min assist. OT Short Term Goal 2 - Progress (Week 2): Met OT Short Term Goal 3 (Week 2): Pt will be able to pull up pants post toileting with mod assist. OT Short Term Goal 3 - Progress (Week 2): Met OT Short Term Goal 4 (Week 2): Pt will be able to don clothing from bed level. OT Short Term Goal 4 - Progress (Week 2): Met  Skilled Therapeutic Interventions/Progress Updates:  Visit 1:   Pt seen for BADL retraining of toileting, bathing, and dressing with a focus on pt using scoot transfers and lateral leans. Pt was able to complete all tasks with close supervision.  Visit 2: Pt worked on toileting skills with transfer to bsc over toilet and to new heavy duty drop arm BSC that patient will be taking home.  Pt was supervision with all transfers.       Therapy Documentation Precautions:  Precautions Precautions: Fall Required Braces or Orthoses: Other Brace/Splint Other  Brace/Splint: PRAFO rt foot at all times Restrictions Weight Bearing Restrictions: Yes LLE Weight Bearing: Non weight bearing Other Position/Activity Restrictions: BKA on left.  Pain: Pain Assessment Pain Assessment: 0-10 Pain Score:   5 Pain Type: Surgical pain Pain Location: Leg Pain Orientation: Left Pain Descriptors: Aching Pain Onset: Gradual Patients Stated Pain Goal: 2 Pain Intervention(s): RN made aware (RN provided meds)  See FIM for current functional status  Therapy/Group: Individual Therapy  Onyekachi Gathright 08/08/2011, 8:57 AM

## 2011-08-08 NOTE — Progress Notes (Signed)
Occupational Therapy Discharge Summary  Patient Details  Name: Alan [name] MRN: [id number] Date of Birth: 1931/09/29  Today's Date: 08/08/2011    Patient has made a great deal of progress as he was admitted at a max assist level with LB dressing and toileting.  Patient has met 5 of 6 long term goals due to improved activity tolerance and improved balance.  Patient to discharge at overall Supervision level. He actually exceeded his goal of toileting.  Goal was mod assist.  He now toilets with supervision 90% of time. Patient's care partner is independent to provide the necessary physical and cognitive assistance at discharge.  Pt plans to complete an evening bath from bed level and dressing from bed level. Pt transfers to [facility] with supervision and occasionally needs min assist to fully pull pants over hips from [facility].  He is able to do this task 90% of time with supervision only.  His wife has demonstrated proficiency with limb wrapping.  Reasons goals not met: Pt has goal of mod assist with dynamic balance. Pt requires max assist to stand for less than 10 seconds.  This goal was for toileting. Pt instead uses lateral leans for cleansing and clothing management.  Recommendation:  Patient will benefit from ongoing skilled OT services in home health setting to continue to advance functional skills in the area of BADL and Reduce care partner burden.  Equipment: elongated drop arm BSC  Reasons for discharge: treatment goals met  Patient/family agrees with progress made and goals achieved: Yes  OT Discharge ADL Refer to FIM   Vision/Perception  Vision - Assessment Eye Alignment: Within Functional Limits Perception Perception: Within Functional Limits Praxis Praxis: Intact  Cognition Overall Cognitive Status: Other (comment) (minimal memory impairment (baseline PTA)) Sensation Sensation Light Touch: Appears Intact Stereognosis: Appears Intact Hot/Cold: Appears  Intact Proprioception: Appears Intact Coordination Gross Motor Movements are Fluid and Coordinated: Yes Fine Motor Movements are Fluid and Coordinated: Yes Motor  Motor Motor - Discharge Observations: generalized weakness and inability to walk, stand Mobility  Refer to FIM    Trunk/Postural Assessment  Cervical Assessment Cervical Assessment: Within Functional Limits Thoracic Assessment Thoracic Assessment: Within Functional Limits Lumbar Assessment Lumbar Assessment: Within Functional Limits Postural Control Postural Control: Within Functional Limits  Balance Static Sitting Balance Static Sitting - Level of Assistance: 7: Independent Dynamic Sitting Balance Dynamic Sitting - Level of Assistance: 7: Independent Static Standing Balance Static Standing - Level of Assistance: 4: Min assist Dynamic Standing Balance Dynamic Standing - Level of Assistance: 2: Max assist Extremity/Trunk Assessment RUE Assessment RUE Assessment: Within Functional Limits LUE Assessment LUE Assessment: Within Functional Limits  See FIM for current functional status  Aldo Sondgeroth 08/08/2011, 8:31 AM

## 2011-08-09 ENCOUNTER — Inpatient Hospital Stay (HOSPITAL_COMMUNITY): Payer: MEDICARE

## 2011-08-09 ENCOUNTER — Telehealth: Payer: Self-pay | Admitting: *Deleted

## 2011-08-09 DIAGNOSIS — Z5189 Encounter for other specified aftercare: Secondary | ICD-10-CM

## 2011-08-09 DIAGNOSIS — E1165 Type 2 diabetes mellitus with hyperglycemia: Secondary | ICD-10-CM

## 2011-08-09 DIAGNOSIS — S78119A Complete traumatic amputation at level between unspecified hip and knee, initial encounter: Secondary | ICD-10-CM

## 2011-08-09 DIAGNOSIS — I70269 Atherosclerosis of native arteries of extremities with gangrene, unspecified extremity: Secondary | ICD-10-CM

## 2011-08-09 LAB — RENAL FUNCTION PANEL
Albumin: 2.7 g/dL — ABNORMAL LOW (ref 3.5–5.2)
BUN: 36 mg/dL — ABNORMAL HIGH (ref 6–23)
CO2: 25 mEq/L (ref 19–32)
Calcium: 8.7 mg/dL (ref 8.4–10.5)
Chloride: 97 mEq/L (ref 96–112)
Creatinine, Ser: 7.88 mg/dL — ABNORMAL HIGH (ref 0.50–1.35)
GFR calc Af Amer: 7 mL/min — ABNORMAL LOW (ref >90)
GFR calc non Af Amer: 6 mL/min — ABNORMAL LOW (ref >90)
Glucose, Bld: 91 mg/dL (ref 70–99)
Phosphorus: 5.6 mg/dL — ABNORMAL HIGH (ref 2.3–4.6)
Potassium: 4.6 mEq/L (ref 3.5–5.1)
Sodium: 136 mEq/L (ref 135–145)

## 2011-08-09 LAB — CBC
HCT: 30.7 % — ABNORMAL LOW (ref 39.0–52.0)
Hemoglobin: 9.7 g/dL — ABNORMAL LOW (ref 13.0–17.0)
MCH: 29.5 pg (ref 26.0–34.0)
MCHC: 31.6 g/dL (ref 30.0–36.0)
MCV: 93.3 fL (ref 78.0–100.0)
Platelets: 253 10*3/uL (ref 150–400)
RBC: 3.29 MIL/uL — ABNORMAL LOW (ref 4.22–5.81)
RDW: 15.5 % (ref 11.5–15.5)
WBC: 7.5 10*3/uL (ref 4.0–10.5)

## 2011-08-09 LAB — PROTIME-INR: Prothrombin Time: 25.6 seconds — ABNORMAL HIGH (ref 11.6–15.2)

## 2011-08-09 LAB — GLUCOSE, CAPILLARY: Glucose-Capillary: 80 mg/dL (ref 70–99)

## 2011-08-09 MED ORDER — PARICALCITOL 5 MCG/ML IV SOLN
INTRAVENOUS | Status: AC
  Administered 2011-08-09: 4 ug via INTRAVENOUS
  Filled 2011-08-09: qty 1

## 2011-08-09 MED ORDER — METOPROLOL TARTRATE 25 MG PO TABS: 12.5000 mg | ORAL_TABLET | Freq: Every day | ORAL | Status: DC

## 2011-08-09 NOTE — Procedures (Addendum)
I was present at this dialysis session. I have reviewed the session itself and made appropriate changes.   Vinson Moselle, MD [employer] 08/09/2011, 12:58 PM

## 2011-08-09 NOTE — Telephone Encounter (Signed)
Pt is being discharged from rehab today.  Pt is going home on Cipro 250mg  qd x 2 more days.  Pt's INR 2.29 on Tuesday 08/07/11 and dosage of Coumadin at discharge is 5mg  daily except 2.5mg  MWF.  Orders sent to HD to recheck INR on 08/14/10.

## 2011-08-09 NOTE — Progress Notes (Signed)
Social Work Discharge Note Discharge Note  The overall goal for the admission was met for:   Discharge location: Yes-HOME WITH WIFE AND STEP-SON TO ASSIST  Length of Stay: Yes-16 DAYS  Discharge activity level: Yes-SUPERVISION/MIN W/C LEVEL  Home/community participation: Yes  Services provided included: MD, RD, PT, OT, RN, CM, TR, Pharmacy and SW  Financial Services: Medicare and Private Insurance: AARP  Follow-up services arranged: Home Health: [facility]-PT,RN, DME: [facility]-WHEELCHAIR,ROLLING WLAKER, DROP-ARM BSC and Patient/Family has no preference for HH/DME agencies  Comments (or additional information):FAMILY EDUCATION COMPLETED AND BOTH FEEL COMFORTABLE WITH PT'S CARE  Patient/Family verbalized understanding of follow-up arrangements: Yes  Individual responsible for coordination of the follow-up plan: [name]-WIFE  Confirmed correct DME delivered: Lucy Chris 08/09/2011    Lucy Chris

## 2011-08-09 NOTE — Progress Notes (Signed)
Patient received from hemodialysis at 1134. V/S and CBG taken on arrival back to 4039, all morning meds given. Dressing to L BKA and R second and third toes changed. Allevyn to right ankle clean dry intact, to be changed by home health tomorrow per Marissa Nestle, PA. Discharge instructions provided by Marissa Nestle, PA to patient and family; understanding verbalized. Patient discharged to home with family at 1300. Hedy Camara

## 2011-08-09 NOTE — Progress Notes (Signed)
[facility] Progress Note  Subjective:   Objective Filed Vitals:   08/09/11 0630 08/09/11 0700 08/09/11 0731 08/09/11 0816  BP: 102/51 108/54 100/50 87/46  Pulse: 78 79 79 80  Temp:      TempSrc:      Resp: 18 20 18 16   Height:      Weight:      SpO2:       Physical Exam General: NAD on HD Heart: RRR w/ II/VI murmur Lungs: no rales or cheezes Abdomen: soft NT Extremities: no overt LE edema on right (lower extremity wrapped with boot; left BKA no edema Dialysis Access: right upper AVF Qb 500  Dialysis Prescription: 4 1/4hr, EDW 108.5, QB 500, QD AF 1.5, 2K/2.25Ca. RUA AVF 15G needles. Heparin 3400 Units. Zemplar TIW, No epogen. Venofer 100mg  IV Q 2weekly.   Assessment/Plan  1. PVD with ulceration- S/P Left BKA, 07/21/11 [place]- d/c from rehab today 2. ESRD: HD TTS SWAF; Will need new EDW at time of discharge -  continue same dialysate bath 3.  Anemia: hgb gradually increasing; cont Aranesp (restarted 07/21/11); same wkly IV Fe 4. Metabolic Bone disease- Ca 8.8, phos 5.4; controlled on CaCO3 and Zemplar 4 TIW;  5. Hypertension- controlled on meds (metoprolol 12.5 bid- dose lowered this admission) BP lowish; pre wt today entered by staff on 4000. Pre wt = pre BKA wt.   BP in the 90s with only 1.5 off. 6. Nutrition- albumin 2.7; cont ^ protein renal carb modified diet; suppl prn 7. Afib/flutter-remains on Coumadin with INR therapeutic; on metoprolol 12.5 bid - being held frequently due to low BP;  Discussed with Rehab PA, they will likely change metoprolol to HS only and give pt parameters to hold. 8. Hemorrhoids/Constipation - improving on anusol HC - daily miralax; encouraged to take qhs to prevent worries having BM on HD 9. Gout- no problems on allopurinol  Sheffield Slider, PA-C [facility] Beeper [phone]  08/09/2011,8:29 AM  LOS: 16 days   Patient seen and examined and agree with assessment and plan as above.  Vinson Moselle   MD [facility] [phone] pgr    [phone] cell 08/09/2011, 12:56 PM     Additional Objective Labs: Basic Metabolic Panel:  Lab 08/09/11 7846 08/07/11 1332 08/04/11 1600  NA 136 134* 133*  K 4.6 4.0 3.7  CL 97 94* 93*  CO2 25 23 24   GLUCOSE 91 139* 204*  BUN 36* 44* 36*  CREATININE 7.88* 8.86* 7.74*  CALCIUM 8.7 8.8 8.7  ALB -- -- --  PHOS 5.6* 5.4* 4.8*   Liver Function Tests:  Lab 08/09/11 0633 08/07/11 1332 08/04/11 1600  AST -- -- --  ALT -- -- --  ALKPHOS -- -- --  BILITOT -- -- --  PROT -- -- --  ALBUMIN 2.7* 2.8* 2.8*   Lab Results  Component Value Date   INR 2.29* 08/09/2011   INR 2.27* 08/08/2011   INR 1.85* 08/07/2011    CBC:  Lab 08/09/11 0634 08/07/11 1333 08/04/11 1600 08/02/11 1553  WBC 7.5 9.6 10.2 --  NEUTROABS -- -- -- --  HGB 9.7* 9.6* 9.3* --  HCT 30.7* 30.8* 29.8* --  MCV 93.3 90.9 91.7 91.3  PLT 253 257 308 --  Medications:      . allopurinol  150 mg Oral Daily  . aspirin EC  81 mg Oral Daily  . bacitracin   Topical Daily  . calcium carbonate  1 tablet Oral  TID AC  . ciprofloxacin  250 mg Oral Q breakfast  . darbepoetin (ARANESP) injection - DIALYSIS  150 mcg Intravenous Q Sat-HD  . doxycycline  100 mg Oral Q12H  . ezetimibe-simvastatin  1 tablet Oral QHS  . feeding supplement  1 Container Oral Q24H  . ferric gluconate (FERRLECIT/NULECIT) IV  62.5 mg Intravenous Q Thu-HD  . gabapentin  100 mg Oral QHS  . glimepiride  2 mg Oral QAC breakfast  . hydrocortisone  25 mg Rectal BID  . insulin aspart  0-15 Units Subcutaneous TID WC  . metoprolol tartrate  12.5 mg Oral BID  . oxyCODONE  5 mg Oral BID  . pantoprazole  40 mg Oral Q1200  . paricalcitol  4 mcg Intravenous Q T,Th,Sa-HD  . senna-docusate  2 tablet Oral BID  . warfarin  3 mg Oral q1800  . Warfarin - Pharmacist Dosing Inpatient   Does not apply q1800  . DISCONTD: doxycycline  100 mg Oral Q12H

## 2011-08-10 NOTE — Discharge Summary (Signed)
NAMEHARRIE, [name]            ACCOUNT NO.:  [id number]  MEDICAL RECORD NO.:  [id number]  LOCATION:  4039                         FACILITY:  [facility]  PHYSICIAN:  Alan [name], M.D.DATE OF BIRTH:  February 04, 1931  DATE OF ADMISSION:  07/24/2011 DATE OF DISCHARGE:  08/09/2011                              DISCHARGE SUMMARY   DISCHARGE DIAGNOSES: 1. Peripheral vascular disease with nonhealing ulcers requiring left     below-knee amputation. 2. End-stage renal disease. 3. Diabetes mellitus type 2 with neuropathy. 4. Atrial fibrillation. 5. Coronary artery disease with moderate-to-severe aortic stenosis. 6. Chronic right toe and ankle ulcers. 7. Acute on chronic anemia. 8. Hypertension.  HISTORY OF PRESENT ILLNESS:  [name] is a 78 year old male with history of end-stage renal disease, diabetes mellitus, bilateral lower extremity ulcers with peripheral vascular disease.  He was admitted on July 20, 2011, for left BKA by Dr. Myra [name], postop with acute and chronic anemia, treated with Epogen.  Hemodialysis is ongoing on Tuesday, Thursday, Saturday schedule.  Pain control remains an issue. He was seen by Alan [name] & Alan [name] for pre-prosthetic education. The patient was evaluated by therapy team and we felt that he would benefit from a CIR program.  PAST MEDICAL HISTORY:  Diabetes mellitus type 2, gout, hypertension, hyperlipidemia, end-stage renal disease, prostate cancer treated with seed implant, excision bilateral cataracts, chronic AFib, aortic stenosis, anemia of chronic disease, coronary artery disease with CABG in 1996, diabetic neuropathy, edema, diabetic retinopathy, nephrolithiasis, and shortness of breath.  FUNCTIONAL HISTORY:  The patient was independent prior to admission.  He required min assist with ADLs.  Does not drive.   FUNCTIONAL STATUS: The patient was min to max assist for bed mobility.  +2 total assist 30% for sit to stand transfers.  He required mod  assist for upper body bathing, total assist for lower body bathing, max assist for upper body dressing, total assist lower body dressing.  Noted to have slow processing speed with grooming and upper body exercise tasks.  HOSPITAL COURSE:  [name] was admitted to rehab on July 24, 2011, for inpatient therapies to consist of PT and OT at least 3 hours 5 days a week past admission.  Physiatrist, rehab RN, and therapy team have worked together to provide customized collaborative interdisciplinary care.  Rehab RN has worked with the patient on bowel and bladder issues as well as wound care monitoring.  The patient's blood pressures were checked on b.i.d. basis during this stay.  These were noted to be on low side specially past hemodialysis.  Systolic blood pressures have fluctuated from 95-130, diastolic from 54-76.  His Lopressor was held p.r.n. systolic blood pressure less than 100.  Heart rate has been stable in 60s to 80s range.  The patient's blood sugars were checked on before meal and at bedtime basis.  These are currently ranging from 80s to 160s.   Hemodialysis has been ongoing, on Tuesday, Thursday, Saturday schedule.  Recent labs from 80/01, revealed sodium 136, potassium 4.6, chloride 97, CO2 of 25, BUN 36, creatinine 7.88, glucose 91, phosphorus 5.6, albumin 2.7.  A CBC from 08/01, reveals hemoglobin 9.7, hematocrit 30.7, white count 7.5, platelets 253. Pharmacy has been assisting with  Coumadin monitoring and dose adjustment.  PT/INR at time of discharge is therapeutic at 25.6 and 2.29.  The patient is discharged to home on 2.5 mg on Monday, Wednesday, and Friday and 5 mg all other days.  The patient's BKA site has been monitored along.  He was noted to have erythema at edges.  He also continued with tenderness and hypersensitivity and was started on Neurontin at bedtime.  His hydrocodone was changed to oxycodone with improvement in pain management.  He was  started on doxycycline to help with superficial cellulitis symptoms.  As erythema did not improve, Cipro was added additionally.  He has completed 7-day course of doxycycline and is to complete 7-day course of Cipro past discharge.  The patient has been afebrile during the stay.  No evidence of leukocytosis.  Patient's p.o. intake has been good.  He has been continent of bowel and bladder.  He has had issues with constipation and his bowel program was adjusted to senna-S b.i.d. with improvement in his symptoms.  The patient's left BKA site remains intact, minimal dry bloody drainage noted on dressing.  He is to follow up with Dr. Myra [name] next week for staple removal.  His right lateral malleolus ulcer was initially treated with Silvadene dressing and dry dressing.  This is being changed to Mepilex as it has been clean and dry.  Blistered area is on right third and fourth toes, were cleansed with normal saline with Silvadene cream and dry dressing daily.  A PRAFO was ordered for right and the patient is advised to wear PRAFO at all times, then not in his shoe to help protect his right foot. The patient's endurance level has gradually improved.  During patient's stay in rehab, weekly team conferences were held to monitor the patient's progress, set goals, as well as discuss barriers to discharge. Physical therapy has worked with the patient on strengthening, Mobility, as well as endurance.  The patient is currently able to propel wheelchair for 150 feet at supervision level.  He needs assist to set up wheelchair and the leg rest.  He is able to perform sliding board transfers to mat with supervision and perform squat transfers from chair with arm rest to right with supervision.  He requires assistance to get out of low surfaces.  He is modified independent for bed mobility. Family education was done with wife in regard to supervision that would be required with mobility.  OT has worked with  the patient on self-care tasks.  He is able to complete bathing, dressing, and toileting tasks with close supervision.  He is at supervision for transfers to bedside commode and to a drop-arm bedside commode.  Further followup home health, PT/OT to continue past discharge.  Due to issues with patient's hypotension, his Lopressor was decreased to  q.p.m.  He is advised to check blood pressures on b.i.d. basis, especially prior to taking his BP meds.  He is to hold Lopressor if systolic blood pressure is less than 100.  On August 09, 2011, the patient is discharged to home in improved condition.  DISCHARGE MEDICATIONS:  Cipro 250 mg p.o. per day, gabapentin 100 mg p.o. at bedtime, OxyIR 5 mg 1 p.o. q.4 h. p.r.n. moderate-to-severe pain #60 Rx, allopurinol 300 mg half-pill p.o. per day, coated aspirin 81 mg a day, Tums 1 p.o. t.i.d. before meals, Prosom half-tab p.o. at bedtime p.r.n. insomnia, Vytorin 10-40 q.p.m., nephro supplements t.i.d. p.r.n., Amaryl 4 mg half p.o. per day, Silvadene cream to right  toes daily, Ultram 50 mg p.o. b.i.d. p.r.n. pain.  DIET:  Renal diet with carb restrictions.  ACTIVITY LEVEL:  As tolerated at wheelchair level with 24-hour supervision.  SPECIAL INSTRUCTIONS:  No alcohol.  Check blood sugars b.i.d. and record.  Boost every 20-30 minutes when in chair.  Check blood sugars b.i.d. basis.  [facility] to provide PT, OT, and RN.  WOUND CARE:  Keep wound clean and dry.  Wash left BKA site with soap and water, pat dry, and apply Ace wrap. wash foot with soap and water, apply Silvadene cream to right toe and cover with dry dressing.  Apply Mepilex to right ankle ulcer and change every 4-5 days.  FOLLOWUP:  The patient to follow up with Dr. Myra [name] on August 20, 2011, at 11:15.  Follow up with Dr. Claudette Laws, September 14, 2011, at 2 p.m.  Follow up with Dr. Casimiro Needle Altheimer for posthospital check. Follow up with Dr. Swaziland for routine  check.     Delle Reining, P.A.   ______________________________ Alan [name], M.D.    PL/MEDQ  D:  08/09/2011  T:  08/10/2011  Job:  [id number]  cc:   Peter M. Swaziland, M.D. Jorge Ny, MD Veverly Fells. Altheimer, M.D. [facility]

## 2011-08-14 LAB — PROTIME-INR: INR: 2.9 — AB (ref 0.9–1.1)

## 2011-08-16 ENCOUNTER — Ambulatory Visit: Payer: Self-pay | Admitting: Cardiology

## 2011-08-16 DIAGNOSIS — Z7901 Long term (current) use of anticoagulants: Secondary | ICD-10-CM

## 2011-08-16 DIAGNOSIS — I4891 Unspecified atrial fibrillation: Secondary | ICD-10-CM

## 2011-08-17 ENCOUNTER — Encounter: Payer: Self-pay | Admitting: Surgery

## 2011-08-20 ENCOUNTER — Encounter: Payer: Self-pay | Admitting: Surgery

## 2011-08-20 ENCOUNTER — Other Ambulatory Visit: Payer: Self-pay

## 2011-08-20 ENCOUNTER — Ambulatory Visit (INDEPENDENT_AMBULATORY_CARE_PROVIDER_SITE_OTHER): Payer: MEDICARE | Admitting: Surgery

## 2011-08-20 VITALS — BP 137/77 | HR 85 | Resp 18 | Ht 72.0 in | Wt 239.0 lb

## 2011-08-20 DIAGNOSIS — I739 Peripheral vascular disease, unspecified: Secondary | ICD-10-CM

## 2011-08-20 DIAGNOSIS — L98499 Non-pressure chronic ulcer of skin of other sites with unspecified severity: Secondary | ICD-10-CM

## 2011-08-20 NOTE — Progress Notes (Signed)
[facility] [place]   Patient name: Alan [name] MRN: [id number] DOB: 01/24/1931 Sex: male     Chief Complaint  Patient presents with  . PVD    Staple removal LEFT  BKA 07/20/2011  HD Tuesday-Thursday-Saturday    HISTORY OF PRESENT ILLNESS: The patient is back today for followup. On 07/20/2011 he underwent left below knee amputation for ischemic ulcers. He did not have reconstructable vascular disease. He is doing well at this time. He also has ulcerative changes to the right leg but we had been delaying intervening on the right until he has recovered from the left.  Past Medical History  Diagnosis Date  . Diabetes mellitus   . Gout   . Hypertension   . Hyperlipidemia   . Chronic kidney disease     Started dialysis October 2012  . Arthritis   . Prostate cancer     s/p seed implant  . Cataract     BILATERAL-BEEN REMOVED  . Neuromuscular disorder     DABETIC NEUROPATHY-LOWER EXTREMITY  . Atrial fibrillation     on coumadin  . Aortic stenosis     moderate to severe, not felt to be a surgical candidate  . Anemia     secondary to end stage renal disease  . Coronary artery disease     remote CABG in 1996; last stress test in 2005; last cath 2005  . Diabetic neuropathy   . Edema   . PAD (peripheral artery disease)   . Diabetic retinopathy   . Chronic anticoagulation     on coumadin  . Nephrolithiasis     prior history of percutaneous nephrostomy  . Shortness of breath     Past Surgical History  Procedure Date  . Ventral hernia repair   . Inguinal hernia repair     right side  . Colonoscopy   . Kidney stone surgery   . Polypectomy   . Tonsillectomy   . Toe surgery     removal little toe right foot  . Cardiac catheterization 04/19/2003    SEVERE 2 VESSEL OBSTRUCTIVE ATHERSCLEROTIC CAD  . Coronary artery bypass graft 1996    LIMA GRAFT TO THE LAD, SEQUENTIAL SAPHENOUS  VEIN GRAFT TO THE FIRST AND SECOND DIAGONIAL BRANCHES, SEQUENTIAL  SAPHENOUS VEIN GRAFT TO THE ACUTE MARGINAL, POSTERIOR DESCENDING, AND POSTERIOR LATERAL BRANCHES OF THE RIGHT CORONARY ARTERY  . Radioactive seed implant   . Us echocardiography 09/06/2009    EF 55-60%  . Cardiovascular stress test 04/13/2003    EF 54%. EVIDENCE OF ANTERO-APICAL ISCHEMIA. NORMAL LV SIZE AND FUNCTION  . Tee without cardioversion 05/09/2011    Procedure: TRANSESOPHAGEAL ECHOCARDIOGRAM (TEE);  Surgeon: Dalton S McLean, MD;  Location: [facility] ENDOSCOPY;  Service: Cardiovascular;  Laterality: N/A;  . Hernia repair   . Amputation 07/20/2011    Procedure: AMPUTATION BELOW KNEE;  Surgeon: [name] W Brabham, MD;  Location: [facility] OR;  Service: Vascular;  Laterality: Left;    History   Social History  . Marital Status: Married    Spouse Name: N/A    Number of Children: N/A  . Years of Education: N/A   Occupational History  . Not on file.   Social History Main Topics  . Smoking status: Former Smoker -- 2 years    Types: Cigarettes    Quit date: 04/10/1968  . Smokeless tobacco: Former User    Types: Chew  . Alcohol Use: No  . Drug Use: No  . Sexually Active: No     Other Topics Concern  . Not on file   Social History Narrative  . No narrative on file    Family History  Problem Relation Age of Onset  . Heart disease Father   . Hypertension Brother   . Stroke Brother   . Diabetes Mother     Pt. not sure, but he thinks she did.    Allergies as of 08/20/2011 - Review Complete 08/20/2011  Allergen Reaction Noted  . Ancef (cefazolin) Rash 05/18/2011    Current Outpatient Prescriptions on File Prior to Visit  Medication Sig Dispense Refill  . allopurinol (ZYLOPRIM) 300 MG tablet Take 150 mg by mouth daily.      . aspirin EC 81 MG tablet Take 81 mg by mouth daily.      . calcium carbonate (TUMS - DOSED IN MG ELEMENTAL CALCIUM) 500 MG chewable tablet Chew 1 tablet by mouth 2 (two) times daily. Phosphorous binder      . ezetimibe-simvastatin (VYTORIN) 10-40 MG per tablet Take 1  tablet by mouth at bedtime.  30 tablet  0  . glimepiride (AMARYL) 4 MG tablet Take 2 mg by mouth daily before breakfast.        . metoprolol tartrate (LOPRESSOR) 25 MG tablet Take 0.5 tablets (12.5 mg total) by mouth daily at 6 PM. ##CHECK BLOOD PRESSURE BEFORE TAKING THIS MEDICATION.  IF THE TOP NUMBER (SBP) IS LESS THAN 100 DO NOT TAKE IT.  30 tablet  1  . warfarin (COUMADIN) 5 MG tablet Take 1/2 tablet on mon, wed, fri. And whole tablet on tue, thur, sat, sun.  30 tablet  0  . estazolam (PROSOM) 2 MG tablet Take 1 mg by mouth at bedtime as needed. For sleep      . gabapentin (NEURONTIN) 100 MG capsule Take 1 capsule (100 mg total) by mouth at bedtime.  30 capsule  1  . Nutritional Supplements (FEEDING SUPPLEMENT, NEPRO CARB STEADY,) LIQD Take 237 mLs by mouth 3 (three) times daily as needed.      . silver sulfADIAZINE (SILVADENE) 1 % cream Apply 1 application topically daily.      . traMADol (ULTRAM) 50 MG tablet Take 50 mg by mouth 2 (two) times daily as needed. pain         REVIEW OF SYSTEMS: No changes from prior visit  PHYSICAL EXAMINATION:   Vital signs are BP 137/77  Pulse 85  Resp 18  Ht 6' (1.829 m)  Wt 239 lb (108.41 kg)  BMI 32.41 kg/m2  SpO2 98% General: The patient appears their stated age. HEENT:  No gross abnormalities Pulmonary:  Non labored breathing Musculoskeletal: There are no major deformities. Neurologic: No focal weakness or paresthesias are detected, Skin: The left below knee" site is healing nicely. There is no drainage. The staples are still intact. There is no erythema. On the right there is a healing ulcer on the dorsum of the third toe. There is a open wound on the lateral malleolus approximately 2 mm. Psychiatric: The patient has normal affect. Cardiovascular: There is a regular rate and rhythm without significant murmur appreciated.   Diagnostic Studies None  Assessment: Status post left below-knee amputation Right leg ulcer Plan: The  patient's staples will be removed today from his below knee amputation on the left. I have discussed that he may have some skin separation once the staples are removed and for that reason I am placing Steri-Strips. He may have to deal with some wound healing issues but ultimately I believe   his below knee dictation will heal.  His right leg ulcers have remained stable however now that we have addressed the left leg I think we need to turn attention back towards the right. I have reviewed his angiogram today. He has occlusion of all 3 tibial vessels. There is reconstitution of the peroneal artery which is the dominant inflow to the foot. I will plan on trying to cross the occlusion and the peroneal artery to given in line flow to the ankle. Hopefully this will be about blood flow to enable him to heal his wounds. He realizes that he is at risk for limb loss on the right leg as well. I am scheduling his angiogram for Wednesday, August 21. He will need to be off of his Coumadin for 5 days prior. I will access the left groin. He has dialysis on Monday Wednesday Friday  V. Wells Brabham IV, M.D. [facility] [place] Office: [phone] Pager:  [phone]   

## 2011-08-21 ENCOUNTER — Encounter (HOSPITAL_COMMUNITY): Payer: Self-pay | Admitting: Pharmacy Technician

## 2011-08-21 ENCOUNTER — Telehealth: Payer: Self-pay

## 2011-08-21 NOTE — Telephone Encounter (Signed)
Received phone call from Okey Regal at [facility] office stating Dr.Brabham has ordered a angiogram to check blood flow in patient's legs,and wants to know if ok to have pt hold coumadin.Okey Regal was told Dr.Jordan out of office this week.Spoke to DOD Dr.McAlhany he advised ok to hold coumadin 5 days prior to angiogram.

## 2011-08-24 ENCOUNTER — Ambulatory Visit: Payer: Self-pay | Admitting: Internal Medicine

## 2011-08-24 DIAGNOSIS — Z7901 Long term (current) use of anticoagulants: Secondary | ICD-10-CM

## 2011-08-24 DIAGNOSIS — I4891 Unspecified atrial fibrillation: Secondary | ICD-10-CM

## 2011-08-28 ENCOUNTER — Telehealth: Payer: Self-pay | Admitting: Physical Medicine & Rehabilitation

## 2011-08-28 MED ORDER — SODIUM CHLORIDE 0.9 % IV SOLN: INTRAVENOUS | Status: DC

## 2011-08-28 NOTE — Telephone Encounter (Signed)
Wife called and stated patient no longer wanted [facility] services.  Lynford Humphrey unable to convince otherwise.  FYI

## 2011-08-28 NOTE — Telephone Encounter (Signed)
Noted  

## 2011-08-29 ENCOUNTER — Telehealth: Payer: Self-pay | Admitting: *Deleted

## 2011-08-29 ENCOUNTER — Encounter (HOSPITAL_COMMUNITY)
Admission: RE | Disposition: A | Discharge status: Home / Self Care | Payer: Self-pay | Source: Ambulatory Visit | Attending: Surgery

## 2011-08-29 ENCOUNTER — Ambulatory Visit (HOSPITAL_COMMUNITY)
Admission: RE | Admit: 2011-08-29 | Discharge: 2011-08-29 | Disposition: A | Discharge status: Home / Self Care | Payer: MEDICARE | Source: Ambulatory Visit | Attending: Surgery | Admitting: Surgery

## 2011-08-29 DIAGNOSIS — L98499 Non-pressure chronic ulcer of skin of other sites with unspecified severity: Secondary | ICD-10-CM | POA: Insufficient documentation

## 2011-08-29 DIAGNOSIS — I4891 Unspecified atrial fibrillation: Secondary | ICD-10-CM | POA: Insufficient documentation

## 2011-08-29 DIAGNOSIS — E1149 Type 2 diabetes mellitus with other diabetic neurological complication: Secondary | ICD-10-CM | POA: Insufficient documentation

## 2011-08-29 DIAGNOSIS — I120 Hypertensive chronic kidney disease with stage 5 chronic kidney disease or end stage renal disease: Secondary | ICD-10-CM | POA: Insufficient documentation

## 2011-08-29 DIAGNOSIS — S88119A Complete traumatic amputation at level between knee and ankle, unspecified lower leg, initial encounter: Secondary | ICD-10-CM | POA: Insufficient documentation

## 2011-08-29 DIAGNOSIS — I739 Peripheral vascular disease, unspecified: Secondary | ICD-10-CM

## 2011-08-29 DIAGNOSIS — N186 End stage renal disease: Secondary | ICD-10-CM | POA: Insufficient documentation

## 2011-08-29 DIAGNOSIS — Z7901 Long term (current) use of anticoagulants: Secondary | ICD-10-CM | POA: Insufficient documentation

## 2011-08-29 DIAGNOSIS — E1142 Type 2 diabetes mellitus with diabetic polyneuropathy: Secondary | ICD-10-CM | POA: Insufficient documentation

## 2011-08-29 DIAGNOSIS — Z992 Dependence on renal dialysis: Secondary | ICD-10-CM | POA: Insufficient documentation

## 2011-08-29 HISTORY — PX: ABDOMINAL AORTAGRAM: SHX5454

## 2011-08-29 LAB — POCT I-STAT, CHEM 8
BUN: 27 mg/dL — ABNORMAL HIGH (ref 6–23)
Calcium, Ion: 1.09 mmol/L — ABNORMAL LOW (ref 1.13–1.30)
Chloride: 98 mEq/L (ref 96–112)
Creatinine, Ser: 5.8 mg/dL — ABNORMAL HIGH (ref 0.50–1.35)
Glucose, Bld: 81 mg/dL (ref 70–99)
HCT: 40 % (ref 39.0–52.0)
Hemoglobin: 13.6 g/dL (ref 13.0–17.0)
Potassium: 3.9 mEq/L (ref 3.5–5.1)
Sodium: 141 mEq/L (ref 135–145)
TCO2: 29 mmol/L (ref 0–100)

## 2011-08-29 LAB — GLUCOSE, CAPILLARY: Glucose-Capillary: 76 mg/dL (ref 70–99)

## 2011-08-29 SURGERY — ABDOMINAL AORTAGRAM
Anesthesia: LOCAL | Laterality: Right

## 2011-08-29 MED ORDER — HEPARIN SODIUM (PORCINE) 1000 UNIT/ML IJ SOLN
INTRAMUSCULAR | Status: AC
  Filled 2011-08-29: qty 1

## 2011-08-29 MED ORDER — MIDAZOLAM HCL 2 MG/2ML IJ SOLN
INTRAMUSCULAR | Status: AC
  Filled 2011-08-29: qty 2

## 2011-08-29 MED ORDER — METOPROLOL TARTRATE 1 MG/ML IV SOLN: 2.0000 mg | INTRAVENOUS | Status: DC | PRN

## 2011-08-29 MED ORDER — OXYCODONE HCL 5 MG PO TABS
5.0000 mg | ORAL_TABLET | ORAL | Status: DC | PRN
  Administered 2011-08-29: 5 mg via ORAL

## 2011-08-29 MED ORDER — FENTANYL CITRATE 0.05 MG/ML IJ SOLN
INTRAMUSCULAR | Status: AC
  Filled 2011-08-29: qty 2

## 2011-08-29 MED ORDER — PHENOL 1.4 % MT LIQD: 1.0000 | OROMUCOSAL | Status: DC | PRN

## 2011-08-29 MED ORDER — ONDANSETRON HCL 4 MG/2ML IJ SOLN: 4.0000 mg | Freq: Four times a day (QID) | INTRAMUSCULAR | Status: DC | PRN

## 2011-08-29 MED ORDER — HEPARIN (PORCINE) IN NACL 2-0.9 UNIT/ML-% IJ SOLN
INTRAMUSCULAR | Status: AC
  Filled 2011-08-29: qty 1000

## 2011-08-29 MED ORDER — CLONIDINE HCL 0.2 MG PO TABS: 0.2000 mg | ORAL_TABLET | ORAL | Status: DC | PRN

## 2011-08-29 MED ORDER — ACETAMINOPHEN 325 MG RE SUPP: 325.0000 mg | RECTAL | Status: DC | PRN

## 2011-08-29 MED ORDER — OXYCODONE HCL 5 MG PO TABS
ORAL_TABLET | ORAL | Status: AC
  Filled 2011-08-29: qty 1

## 2011-08-29 MED ORDER — ACETAMINOPHEN 325 MG PO TABS: 325.0000 mg | ORAL_TABLET | ORAL | Status: DC | PRN

## 2011-08-29 MED ORDER — ALUM & MAG HYDROXIDE-SIMETH 200-200-20 MG/5ML PO SUSP: 15.0000 mL | ORAL | Status: DC | PRN

## 2011-08-29 MED ORDER — LIDOCAINE HCL (PF) 1 % IJ SOLN
INTRAMUSCULAR | Status: AC
  Filled 2011-08-29: qty 30

## 2011-08-29 MED ORDER — MORPHINE SULFATE 10 MG/ML IJ SOLN: 2.0000 mg | INTRAMUSCULAR | Status: DC | PRN

## 2011-08-29 MED ORDER — GUAIFENESIN-DM 100-10 MG/5ML PO SYRP: 15.0000 mL | ORAL_SOLUTION | ORAL | Status: DC | PRN

## 2011-08-29 NOTE — H&P (View-Only) (Signed)
[facility] and Vein Specialist of [facility]   Patient name: Alan [name] MRN: [id number] DOB: 22-Jun-1931 Sex: male     Chief Complaint  Patient presents with  . PVD    Staple removal LEFT  BKA 07/20/2011  HD Tuesday-Thursday-Saturday    HISTORY OF PRESENT ILLNESS: The patient is back today for followup. On 07/20/2011 he underwent left below knee amputation for ischemic ulcers. He did not have reconstructable vascular disease. He is doing well at this time. He also has ulcerative changes to the right leg but we had been delaying intervening on the right until he has recovered from the left.  Past Medical History  Diagnosis Date  . Diabetes mellitus   . Gout   . Hypertension   . Hyperlipidemia   . Chronic kidney disease     Started dialysis October 2012  . Arthritis   . Prostate cancer     s/p seed implant  . Cataract     BILATERAL-BEEN REMOVED  . Neuromuscular disorder     DABETIC NEUROPATHY-LOWER EXTREMITY  . Atrial fibrillation     on coumadin  . Aortic stenosis     moderate to severe, not felt to be a surgical candidate  . Anemia     secondary to end stage renal disease  . Coronary artery disease     remote CABG in 1996; last stress test in 2005; last cath 2005  . Diabetic neuropathy   . Edema   . PAD (peripheral artery disease)   . Diabetic retinopathy   . Chronic anticoagulation     on coumadin  . Nephrolithiasis     prior history of percutaneous nephrostomy  . Shortness of breath     Past Surgical History  Procedure Date  . Ventral hernia repair   . Inguinal hernia repair     right side  . Colonoscopy   . Kidney stone surgery   . Polypectomy   . Tonsillectomy   . Toe surgery     removal little toe right foot  . Cardiac catheterization 04/19/2003    SEVERE 2 VESSEL OBSTRUCTIVE ATHERSCLEROTIC CAD  . Coronary artery bypass graft 1996    LIMA GRAFT TO THE LAD, SEQUENTIAL SAPHENOUS  VEIN GRAFT TO THE FIRST AND SECOND DIAGONIAL BRANCHES, SEQUENTIAL  SAPHENOUS VEIN GRAFT TO THE ACUTE MARGINAL, POSTERIOR DESCENDING, AND POSTERIOR LATERAL BRANCHES OF THE RIGHT CORONARY ARTERY  . Radioactive seed implant   . US echocardiography 09/06/2009    EF 55-60%  . Cardiovascular stress test 04/13/2003    EF 54%. EVIDENCE OF ANTERO-APICAL ISCHEMIA. NORMAL LV SIZE AND FUNCTION  . Tee without cardioversion 05/09/2011    Procedure: TRANSESOPHAGEAL ECHOCARDIOGRAM (TEE);  Surgeon: Laurey Morale, MD;  Location: [facility] ENDOSCOPY;  Service: Cardiovascular;  Laterality: N/A;  . Hernia repair   . Amputation 07/20/2011    Procedure: AMPUTATION BELOW KNEE;  Surgeon: Nada Libman, MD;  [facility] OR;  Service: Vascular;  Laterality: Left;    History   Social History  . Marital Status: Married    Spouse Name: N/A    Number of Children: N/A  . Years of Education: N/A   Occupational History  . Not on file.   Social History Main Topics  . Smoking status: Former Smoker -- 2 years    Types: Cigarettes    Quit date: 04/10/1968  . Smokeless tobacco: Former Neurosurgeon    Types: Chew  . Alcohol Use: No  . Drug Use: No  . Sexually Active: No  Other Topics Concern  . Not on file   Social History Narrative  . No narrative on file    Family History  Problem Relation Age of Onset  . Heart disease Father   . Hypertension Brother   . Stroke Brother   . Diabetes Mother     Pt. not sure, but he thinks she did.    Allergies as of 08/20/2011 - Review Complete 08/20/2011  Allergen Reaction Noted  . Ancef (cefazolin) Rash 05/18/2011    Current Outpatient Prescriptions on File Prior to Visit  Medication Sig Dispense Refill  . allopurinol (ZYLOPRIM) 300 MG tablet Take 150 mg by mouth daily.      [name] aspirin EC 81 MG tablet Take 81 mg by mouth daily.      . calcium carbonate (TUMS - DOSED IN MG ELEMENTAL CALCIUM) 500 MG chewable tablet Chew 1 tablet by mouth 2 (two) times daily. Phosphorous binder      . ezetimibe-simvastatin (VYTORIN) 10-40 MG per tablet Take 1  tablet by mouth at bedtime.  30 tablet  0  . glimepiride (AMARYL) 4 MG tablet Take 2 mg by mouth daily before breakfast.        . metoprolol tartrate (LOPRESSOR) 25 MG tablet Take 0.5 tablets (12.5 mg total) by mouth daily at 6 PM. ##CHECK BLOOD PRESSURE BEFORE TAKING THIS MEDICATION.  IF THE TOP NUMBER (SBP) IS LESS THAN 100 DO NOT TAKE IT.  30 tablet  1  . warfarin (COUMADIN) 5 MG tablet Take 1/2 tablet on mon, wed, fri. And whole tablet on tue, thur, sat, sun.  30 tablet  0  . estazolam (PROSOM) 2 MG tablet Take 1 mg by mouth at bedtime as needed. For sleep      . gabapentin (NEURONTIN) 100 MG capsule Take 1 capsule (100 mg total) by mouth at bedtime.  30 capsule  1  . Nutritional Supplements (FEEDING SUPPLEMENT, NEPRO CARB STEADY,) LIQD Take 237 mLs by mouth 3 (three) times daily as needed.      . silver sulfADIAZINE (SILVADENE) 1 % cream Apply 1 application topically daily.      . traMADol (ULTRAM) 50 MG tablet Take 50 mg by mouth 2 (two) times daily as needed. pain         REVIEW OF SYSTEMS: No changes from prior visit  PHYSICAL EXAMINATION:   Vital signs are BP 137/77  Pulse 85  Resp 18  Ht 6' (1.829 m)  Wt 239 lb (108.41 kg)  BMI 32.41 kg/m2  SpO2 98% General: The patient appears their stated age. HEENT:  No gross abnormalities Pulmonary:  Non labored breathing Musculoskeletal: There are no major deformities. Neurologic: No focal weakness or paresthesias are detected, Skin: The left below knee" site is healing nicely. There is no drainage. The staples are still intact. There is no erythema. On the right there is a healing ulcer on the dorsum of the third toe. There is a open wound on the lateral malleolus approximately 2 mm. Psychiatric: The patient has normal affect. Cardiovascular: There is a regular rate and rhythm without significant murmur appreciated.   Diagnostic Studies None  Assessment: Status post left below-knee amputation Right leg ulcer Plan: The  patient's staples will be removed today from his below knee amputation on the left. I have discussed that he may have some skin separation once the staples are removed and for that reason I am placing Steri-Strips. He may have to deal with some wound healing issues but ultimately I believe  his below knee dictation will heal.  His right leg ulcers have remained stable however now that we have addressed the left leg I think we need to turn attention back towards the right. I have reviewed his angiogram today. He has occlusion of all 3 tibial vessels. There is reconstitution of the peroneal artery which is the dominant inflow to the foot. I will plan on trying to cross the occlusion and the peroneal artery to given in line flow to the ankle. Hopefully this will be about blood flow to enable him to heal his wounds. He realizes that he is at risk for limb loss on the right leg as well. I am scheduling his angiogram for Wednesday, August 21. He will need to be off of his Coumadin for 5 days prior. I will access the left groin. He has dialysis on Monday Wednesday Friday  V. Charlena Cross, M.D. [facility] [place] Office: [phone] Pager:  [phone]

## 2011-08-29 NOTE — Telephone Encounter (Signed)
Called patient at request of [facility] PT. Patient is refusing home physical therapy and the patient is unable to transfer to bed or chair at this point. I called patient and spoke with wife and she stated it was his right to refuse home health.I explained that it would benefit patient greatly and the caregiver at home if he would allow therapists to continue. She said no. I reported this to Oswaldo Done, Clinic Team Leader and she notified Dr Seth Bake.Wells Brabham.

## 2011-08-29 NOTE — Interval H&P Note (Signed)
History and Physical Interval Note:  08/29/2011 8:35 AM  Alan [name]  has presented today for surgery, with the diagnosis of pvd  The various methods of treatment have been discussed with the patient and family. After consideration of risks, benefits and other options for treatment, the patient has consented to  Procedure(s) (LRB): ABDOMINAL AORTAGRAM (Right) as a surgical intervention .  The patient's history has been reviewed, patient examined, no change in status, stable for surgery.  I have reviewed the patient's chart and labs.  Questions were answered to the patient's satisfaction.     [name], V. WELLS

## 2011-08-29 NOTE — Op Note (Signed)
[facility] [place]  Patient name: Alan [name] MRN: [id number] DOB: 11-Jan-1931 Sex: male  08/29/2011 Pre-operative Diagnosis:right leg ulcer Post-operative diagnosis:  Same Surgeon:  Jorge Ny Procedure Performed:  1.  ultrasound access left common femoral artery  2.  abdominal aortogram  3.  right lower extremity runoff  4.  atherectomy right peroneal artery  5.  catheter selection right posterior tibial artery with selective right posterior tibial angiogram    Indications:  The patient has known severe arterial occlusion laterally he is undergone left leg amputation he comes in today for an attempt at limb salvage on the right he does have ulceration and open wound on his right toe.  Procedure:  The patient was identified in the holding area and taken to room 8.  The patient was then placed supine on the table and prepped and draped in the usual sterile fashion.  A time out was called.  Ultrasound was used to evaluate the left common femoral artery.  It was patent .  A digital ultrasound image was acquired.  A micropuncture needle was used to access the left common femoral artery under ultrasound guidance.  An 018 wire was advanced without resistance and a micropuncture sheath was placed.  The 018 wire was removed and a benson wire was placed.  The micropuncture sheath was exchanged for a 5 french sheath.  An omniflush catheter was advanced over the wire to the level of L-1.  An abdominal angiogram was obtained.  Next, using the omniflush catheter and a benson wire, the aortic bifurcation was crossed and the catheter was placed into theright external iliac artery and right runoff was obtained.  I also performed selective angiography with the catheter tip in the posterior tibial artery during attempted intervention.  Findings:   Aortogram:  The visualized portions of the suprarenal abdominal aorta showed no significant disease. There is no evidence of  renal artery stenosis. The infrarenal abdominal aorta is widely patent. Bilateral common external and internal iliac arteries are widely patent.  Right Lower Extremity:  The right common femoral artery is patent throughout it's course right popliteal artery is patent throughout it's course. The tibioperoneal trunk is diffusely diseased but patent. There is occlusion of the anterior tibial, posterior tibial, and peroneal artery at the origin. There is reconstitution in the mid calf of the peroneal artery which is patent for several centimeters then reoccludes, then reconstitutes and is the main source of blood flow to the foot via the posterior tibial artery reconstitution at the ankle. The posterior tibial artery does reconstitute as well however then occludes in the distal foot.   Intervention:  After the above images were obtained the decision was made to proceed with intervention. A 7 French Ansel 155 cm sheath was placed into the mid superficial femoral artery. The patient was fully heparinized. I then used a 014 cougar wire to get wire access into the popliteal artery. I then used the Usher support catheter down to the tibioperoneal trunk where I used the wire to select the origin of the peroneal artery. The Usher was then brought down to the origin of the peroneal artery. I used a Crosser S6 to attempt to atherectomy of the peroneal artery. I advanced the Crosser down to the mid calf. I thought I had become intraluminal. A contrast injection confirmed that I had gone extraluminal. Multiple attempts were made to try to reenter however these were unsuccessful. Because the tract was well-developed I felt  that proceeding with further attempts of re\re opening the peroneal artery were futile and therefore I felt an attempt of re\re opening in the posterior tibial artery should be made. It was difficult to get into the origin of the posterior tibial artery because I couldn't get very little wire supported do to  a proximal stenosis. I was able to get a 2 mm Fox SV balloon into the origin of the posterior tibial artery. A contrast injection was performed through this which showed the disease in the posterior tibial artery. Ultimately I tried to recanalize the posterior tibial artery however this also became extraluminal. At this point I felt proceeding with additional attempts were not wires and therefore I elected to terminate the procedure. The sheath was exchanged out for a short 7 sheath and the patient was taken to the holding area for sheath pull once his coagulation profile corrects.  Impression:  #1  unsuccessful atherectomy of the peroneal artery  #2  unsuccessful attempt at subintimal recanalization of the posterior tibial artery  #3  I will give consideration for a repeat attempt in 3-4 weeks for limb salvage. I will try the other Crosser device and I will place 7 x 90 sheath into the popliteal artery   V. Durene Cal, M.D. [facility] Office: [phone] Pager:  [phone]

## 2011-09-04 LAB — PROTIME-INR: INR: 1.4 — AB (ref 0.9–1.1)

## 2011-09-06 ENCOUNTER — Ambulatory Visit: Payer: Self-pay | Admitting: Cardiology

## 2011-09-06 DIAGNOSIS — Z7901 Long term (current) use of anticoagulants: Secondary | ICD-10-CM

## 2011-09-06 DIAGNOSIS — I4891 Unspecified atrial fibrillation: Secondary | ICD-10-CM

## 2011-09-12 ENCOUNTER — Other Ambulatory Visit (HOSPITAL_COMMUNITY): Payer: Self-pay | Admitting: Nephrology

## 2011-09-12 DIAGNOSIS — N186 End stage renal disease: Secondary | ICD-10-CM

## 2011-09-14 ENCOUNTER — Inpatient Hospital Stay: Payer: MEDICARE | Admitting: Physical Medicine & Rehabilitation

## 2011-09-17 ENCOUNTER — Other Ambulatory Visit (HOSPITAL_COMMUNITY): Payer: Self-pay | Admitting: Nephrology

## 2011-09-17 ENCOUNTER — Encounter (HOSPITAL_COMMUNITY): Payer: Self-pay

## 2011-09-17 ENCOUNTER — Ambulatory Visit (HOSPITAL_COMMUNITY)
Admission: RE | Admit: 2011-09-17 | Discharge: 2011-09-17 | Disposition: A | Discharge status: Home / Self Care | Payer: MEDICARE | Source: Ambulatory Visit | Attending: Nephrology | Admitting: Nephrology

## 2011-09-17 DIAGNOSIS — Y832 Surgical operation with anastomosis, bypass or graft as the cause of abnormal reaction of the patient, or of later complication, without mention of misadventure at the time of the procedure: Secondary | ICD-10-CM | POA: Insufficient documentation

## 2011-09-17 DIAGNOSIS — I120 Hypertensive chronic kidney disease with stage 5 chronic kidney disease or end stage renal disease: Secondary | ICD-10-CM | POA: Insufficient documentation

## 2011-09-17 DIAGNOSIS — Z992 Dependence on renal dialysis: Secondary | ICD-10-CM | POA: Insufficient documentation

## 2011-09-17 DIAGNOSIS — T82898A Other specified complication of vascular prosthetic devices, implants and grafts, initial encounter: Secondary | ICD-10-CM | POA: Insufficient documentation

## 2011-09-17 DIAGNOSIS — N186 End stage renal disease: Secondary | ICD-10-CM

## 2011-09-17 DIAGNOSIS — I871 Compression of vein: Secondary | ICD-10-CM | POA: Insufficient documentation

## 2011-09-17 DIAGNOSIS — E119 Type 2 diabetes mellitus without complications: Secondary | ICD-10-CM | POA: Insufficient documentation

## 2011-09-17 MED ORDER — IOHEXOL 300 MG/ML  SOLN
100.0000 mL | Freq: Once | INTRAMUSCULAR | Status: AC | PRN
  Administered 2011-09-17: 50 mL via INTRAVENOUS

## 2011-09-17 NOTE — Procedures (Signed)
Successful balloon angioplasty of right cephalic vein stenosis with 5 mm and 6 mm balloons.  No immediate complication.

## 2011-09-17 NOTE — H&P (Signed)
Alan [name] is an 78 y.o. male.   Chief Complaint: rt arm dialysis graft stenosis Last use this am; slow Last intervention: 04/13/11: [place]: pta Scheduled for angioplasty/stent today HPI: ESRD; DM; HTN; prostate ca; cataract; afib; CAD  Past Medical History  Diagnosis Date  . Diabetes mellitus   . Gout   . Hypertension   . Hyperlipidemia   . Chronic kidney disease     Started dialysis October 2012  . Arthritis   . Prostate cancer     s/p seed implant  . Cataract     BILATERAL-BEEN REMOVED  . Neuromuscular disorder     DABETIC NEUROPATHY-LOWER EXTREMITY  . Atrial fibrillation     on coumadin  . Aortic stenosis     moderate to severe, not felt to be a surgical candidate  . Anemia     secondary to end stage renal disease  . Coronary artery disease     remote CABG in 1996; last stress test in 2005; last cath 2005  . Diabetic neuropathy   . Edema   . PAD (peripheral artery disease)   . Diabetic retinopathy   . Chronic anticoagulation     on coumadin  . Nephrolithiasis     prior history of percutaneous nephrostomy  . Shortness of breath     Past Surgical History  Procedure Date  . Ventral hernia repair   . Inguinal hernia repair     right side  . Colonoscopy   . Kidney stone surgery   . Polypectomy   . Tonsillectomy   . Toe surgery     removal little toe right foot  . Cardiac catheterization 04/19/2003    SEVERE 2 VESSEL OBSTRUCTIVE ATHERSCLEROTIC CAD  . Coronary artery bypass graft 1996    LIMA GRAFT TO THE LAD, SEQUENTIAL SAPHENOUS  VEIN GRAFT TO THE FIRST AND SECOND DIAGONIAL BRANCHES, SEQUENTIAL SAPHENOUS VEIN GRAFT TO THE ACUTE MARGINAL, POSTERIOR DESCENDING, AND POSTERIOR LATERAL BRANCHES OF THE RIGHT CORONARY ARTERY  . Radioactive seed implant   . US echocardiography 09/06/2009    EF 55-60%  . Cardiovascular stress test 04/13/2003    EF 54%. EVIDENCE OF ANTERO-APICAL ISCHEMIA. NORMAL LV SIZE AND FUNCTION  . Tee without cardioversion 05/09/2011   Procedure: TRANSESOPHAGEAL ECHOCARDIOGRAM (TEE);  Surgeon: Laurey Morale, MD;  Location: [facility] ENDOSCOPY;  Service: Cardiovascular;  Laterality: N/A;  . Hernia repair   . Amputation 07/20/2011    Procedure: AMPUTATION BELOW KNEE;  Surgeon: Nada Libman, MD;  Location: [facility] OR;  Service: Vascular;  Laterality: Left;    Family History  Problem Relation Age of Onset  . Heart disease Father   . Hypertension Brother   . Stroke Brother   . Diabetes Mother     Pt. not sure, but he thinks she did.   Social History:  reports that he quit smoking about 43 years ago. His smoking use included Cigarettes. He quit after 2 years of use. He has quit using smokeless tobacco. His smokeless tobacco use included Chew. He reports that he does not drink alcohol or use illicit drugs.  Allergies:  Allergies  Allergen Reactions  . Ancef (Cefazolin) Rash     (Not in a hospital admission)  No results found for this or any previous visit (from the past 48 hour(s)). No results found.  Review of Systems  Constitutional: Negative for fever.  Respiratory: Negative for cough and shortness of breath.   Gastrointestinal: Negative for nausea and vomiting.    There were no  vitals taken for this visit. Physical Exam  Constitutional: He is oriented to person, place, and time.  Cardiovascular: Normal rate, regular rhythm and normal heart sounds.   No murmur heard. Respiratory: Effort normal and breath sounds normal. He has no wheezes.  GI: Soft.  Musculoskeletal: Normal range of motion.  Neurological: He is alert and oriented to person, place, and time.  Skin: Skin is warm.  Psychiatric: He has a normal mood and affect. His behavior is normal. Judgment and thought content normal.     Assessment/Plan Rt arm dialysis graft stenosis Scheduled for pta/stent placement Pt aware of procedure benefits and risks and agreeable to proceed. Consent signed  Lashan Macias A 09/17/2011, 1:35 PM

## 2011-09-18 LAB — PROTIME-INR: INR: 2.1 — AB (ref 0.9–1.1)

## 2011-09-20 ENCOUNTER — Ambulatory Visit: Payer: Self-pay | Admitting: Internal Medicine

## 2011-09-20 DIAGNOSIS — I4891 Unspecified atrial fibrillation: Secondary | ICD-10-CM

## 2011-09-20 DIAGNOSIS — Z7901 Long term (current) use of anticoagulants: Secondary | ICD-10-CM

## 2011-09-26 ENCOUNTER — Telehealth: Payer: Self-pay | Admitting: Cardiovascular Disease

## 2011-09-26 ENCOUNTER — Other Ambulatory Visit: Payer: Self-pay

## 2011-09-26 NOTE — Telephone Encounter (Addendum)
Pt is having a Aortagram on 10/2 for eval of blood flow in lower legs pt was told to hold coumadin 5 days prior and they want to make sure Dr. [place] did not want bridging

## 2011-09-27 NOTE — Telephone Encounter (Signed)
Spoke to [place] at [facility]in was told Dr.Jordan advised patient does not need lovenox bridge.

## 2011-10-02 LAB — PROTIME-INR: INR: 1.6 — AB (ref 0.9–1.1)

## 2011-10-03 ENCOUNTER — Encounter (HOSPITAL_COMMUNITY): Payer: Self-pay | Admitting: Respiratory Therapy

## 2011-10-03 ENCOUNTER — Ambulatory Visit: Payer: Self-pay | Admitting: Cardiology

## 2011-10-03 DIAGNOSIS — Z7901 Long term (current) use of anticoagulants: Secondary | ICD-10-CM

## 2011-10-03 DIAGNOSIS — I4891 Unspecified atrial fibrillation: Secondary | ICD-10-CM

## 2011-10-10 ENCOUNTER — Encounter (HOSPITAL_COMMUNITY)
Admission: RE | Disposition: A | Discharge status: Home / Self Care | Payer: Self-pay | Source: Ambulatory Visit | Attending: Surgery

## 2011-10-10 ENCOUNTER — Ambulatory Visit (HOSPITAL_COMMUNITY)
Admission: RE | Admit: 2011-10-10 | Discharge: 2011-10-10 | Disposition: A | Discharge status: Home / Self Care | Payer: MEDICARE | Source: Ambulatory Visit | Attending: Surgery | Admitting: Surgery

## 2011-10-10 DIAGNOSIS — Z7901 Long term (current) use of anticoagulants: Secondary | ICD-10-CM | POA: Insufficient documentation

## 2011-10-10 DIAGNOSIS — L98499 Non-pressure chronic ulcer of skin of other sites with unspecified severity: Secondary | ICD-10-CM | POA: Insufficient documentation

## 2011-10-10 DIAGNOSIS — I739 Peripheral vascular disease, unspecified: Secondary | ICD-10-CM

## 2011-10-10 DIAGNOSIS — E1142 Type 2 diabetes mellitus with diabetic polyneuropathy: Secondary | ICD-10-CM | POA: Insufficient documentation

## 2011-10-10 DIAGNOSIS — E785 Hyperlipidemia, unspecified: Secondary | ICD-10-CM | POA: Insufficient documentation

## 2011-10-10 DIAGNOSIS — N186 End stage renal disease: Secondary | ICD-10-CM | POA: Insufficient documentation

## 2011-10-10 DIAGNOSIS — I4891 Unspecified atrial fibrillation: Secondary | ICD-10-CM | POA: Insufficient documentation

## 2011-10-10 DIAGNOSIS — S88119A Complete traumatic amputation at level between knee and ankle, unspecified lower leg, initial encounter: Secondary | ICD-10-CM | POA: Insufficient documentation

## 2011-10-10 DIAGNOSIS — I120 Hypertensive chronic kidney disease with stage 5 chronic kidney disease or end stage renal disease: Secondary | ICD-10-CM | POA: Insufficient documentation

## 2011-10-10 DIAGNOSIS — Z992 Dependence on renal dialysis: Secondary | ICD-10-CM | POA: Insufficient documentation

## 2011-10-10 DIAGNOSIS — E1149 Type 2 diabetes mellitus with other diabetic neurological complication: Secondary | ICD-10-CM | POA: Insufficient documentation

## 2011-10-10 LAB — POCT I-STAT, CHEM 8
BUN: 30 mg/dL — ABNORMAL HIGH (ref 6–23)
Calcium, Ion: 1.05 mmol/L — ABNORMAL LOW (ref 1.13–1.30)
Chloride: 99 mEq/L (ref 96–112)
Glucose, Bld: 71 mg/dL (ref 70–99)
HCT: 41 % (ref 39.0–52.0)
TCO2: 31 mmol/L (ref 0–100)

## 2011-10-10 LAB — PROTIME-INR
INR: 1.1 (ref 0.00–1.49)
Prothrombin Time: 14.1 seconds (ref 11.6–15.2)

## 2011-10-10 LAB — POCT ACTIVATED CLOTTING TIME
Activated Clotting Time: 209 seconds
Activated Clotting Time: 259 seconds
Activated Clotting Time: 199 seconds

## 2011-10-10 LAB — GLUCOSE, CAPILLARY
Glucose-Capillary: 64 mg/dL — ABNORMAL LOW (ref 70–99)
Glucose-Capillary: 76 mg/dL (ref 70–99)

## 2011-10-10 SURGERY — ANGIOGRAM EXTREMITY RIGHT
Laterality: Right

## 2011-10-10 MED ORDER — SODIUM CHLORIDE 0.9 % IJ SOLN: 3.0000 mL | INTRAMUSCULAR | Status: DC | PRN

## 2011-10-10 MED ORDER — FENTANYL CITRATE 0.05 MG/ML IJ SOLN
INTRAMUSCULAR | Status: AC
  Filled 2011-10-10: qty 2

## 2011-10-10 MED ORDER — HEPARIN (PORCINE) IN NACL 2-0.9 UNIT/ML-% IJ SOLN
INTRAMUSCULAR | Status: AC
  Filled 2011-10-10: qty 1000

## 2011-10-10 MED ORDER — OXYCODONE-ACETAMINOPHEN 5-325 MG PO TABS: 1.0000 | ORAL_TABLET | ORAL | Status: DC | PRN

## 2011-10-10 MED ORDER — ACETAMINOPHEN 325 MG RE SUPP: 325.0000 mg | RECTAL | Status: DC | PRN

## 2011-10-10 MED ORDER — LIDOCAINE HCL (PF) 1 % IJ SOLN
INTRAMUSCULAR | Status: AC
  Filled 2011-10-10: qty 30

## 2011-10-10 MED ORDER — GUAIFENESIN-DM 100-10 MG/5ML PO SYRP: 15.0000 mL | ORAL_SOLUTION | ORAL | Status: DC | PRN

## 2011-10-10 MED ORDER — HYDRALAZINE HCL 20 MG/ML IJ SOLN: 10.0000 mg | INTRAMUSCULAR | Status: DC | PRN

## 2011-10-10 MED ORDER — PHENOL 1.4 % MT LIQD: 1.0000 | OROMUCOSAL | Status: DC | PRN

## 2011-10-10 MED ORDER — ALUM & MAG HYDROXIDE-SIMETH 200-200-20 MG/5ML PO SUSP: 15.0000 mL | ORAL | Status: DC | PRN

## 2011-10-10 MED ORDER — MIDAZOLAM HCL 2 MG/2ML IJ SOLN
INTRAMUSCULAR | Status: AC
  Filled 2011-10-10: qty 2

## 2011-10-10 MED ORDER — LABETALOL HCL 5 MG/ML IV SOLN: 10.0000 mg | INTRAVENOUS | Status: DC | PRN

## 2011-10-10 MED ORDER — ACETAMINOPHEN 325 MG PO TABS: 325.0000 mg | ORAL_TABLET | ORAL | Status: DC | PRN

## 2011-10-10 MED ORDER — METOPROLOL TARTRATE 1 MG/ML IV SOLN: 2.0000 mg | INTRAVENOUS | Status: DC | PRN

## 2011-10-10 MED ORDER — ONDANSETRON HCL 4 MG/2ML IJ SOLN: 4.0000 mg | Freq: Four times a day (QID) | INTRAMUSCULAR | Status: DC | PRN

## 2011-10-10 NOTE — H&P (Signed)
Chief Complaint   Patient presents with   .  PVD     Staple removal LEFT BKA 07/20/2011 HD Tuesday-Thursday-Saturday    HISTORY OF PRESENT ILLNESS:  The patient is back today for followup. On 07/20/2011 he underwent left below knee amputation for ischemic ulcers. He did not have reconstructable vascular disease. He is doing well at this time. He also has ulcerative changes to the right leg but we had been delaying intervening on the right until he has recovered from the left.  Past Medical History   Diagnosis  Date   .  Diabetes mellitus    .  Gout    .  Hypertension    .  Hyperlipidemia    .  Chronic kidney disease      Started dialysis October 2012   .  Arthritis    .  Prostate cancer      s/p seed implant   .  Cataract      BILATERAL-BEEN REMOVED   .  Neuromuscular disorder      DABETIC NEUROPATHY-LOWER EXTREMITY   .  Atrial fibrillation      on coumadin   .  Aortic stenosis      moderate to severe, not felt to be a surgical candidate   .  Anemia      secondary to end stage renal disease   .  Coronary artery disease      remote CABG in 1996; last stress test in 2005; last cath 2005   .  Diabetic neuropathy    .  Edema    .  PAD (peripheral artery disease)    .  Diabetic retinopathy    .  Chronic anticoagulation      on coumadin   .  Nephrolithiasis      prior history of percutaneous nephrostomy   .  Shortness of breath     Past Surgical History   Procedure  Date   .  Ventral hernia repair    .  Inguinal hernia repair      right side   .  Colonoscopy    .  Kidney stone surgery    .  Polypectomy    .  Tonsillectomy    .  Toe surgery      removal little toe right foot   .  Cardiac catheterization  04/19/2003     SEVERE 2 VESSEL OBSTRUCTIVE ATHERSCLEROTIC CAD   .  Coronary artery bypass graft  1996     LIMA GRAFT TO THE LAD, SEQUENTIAL SAPHENOUS VEIN GRAFT TO THE FIRST AND SECOND DIAGONIAL BRANCHES, SEQUENTIAL SAPHENOUS VEIN GRAFT TO THE ACUTE MARGINAL, POSTERIOR  DESCENDING, AND POSTERIOR LATERAL BRANCHES OF THE RIGHT CORONARY ARTERY   .  Radioactive seed implant    .  US echocardiography  09/06/2009     EF 55-60%   .  Cardiovascular stress test  04/13/2003     EF 54%. EVIDENCE OF ANTERO-APICAL ISCHEMIA. NORMAL LV SIZE AND FUNCTION   .  Tee without cardioversion  05/09/2011     Procedure: TRANSESOPHAGEAL ECHOCARDIOGRAM (TEE); Surgeon: Laurey Morale, MD; Location[facility] ENDOSCOPY; Service: Cardiovascular; Laterality: N/A;   .  Hernia repair    .  Amputation  07/20/2011     Procedure: AMPUTATION BELOW KNEE; Surgeon: Nada Libman, MD; Location: [facility] OR; Service: Vascular; Laterality: Left;    History    Social History   .  Marital Status:  Married     Spouse Name:  N/A  Number of Children:  N/A   .  Years of Education:  N/A    Occupational History   .  Not on file.    Social History Main Topics   .  Smoking status:  Former Smoker -- 2 years     Types:  Cigarettes     Quit date:  04/10/1968   .  Smokeless tobacco:  Former Neurosurgeon     Types:  Chew   .  Alcohol Use:  No   .  Drug Use:  No   .  Sexually Active:  No    Other Topics  Concern   .  Not on file    Social History Narrative   .  No narrative on file    Family History   Problem  Relation  Age of Onset   .  Heart disease  Father    .  Hypertension  Brother    .  Stroke  Brother    .  Diabetes  Mother       Pt. not sure, but he thinks she did.    Allergies as of 08/20/2011 - Review Complete 08/20/2011   Allergen  Reaction  Noted   .  Ancef (cefazolin)  Rash  05/18/2011    Current Outpatient Prescriptions on File Prior to Visit   Medication  Sig  Dispense  Refill   .  allopurinol (ZYLOPRIM) 300 MG tablet  Take 150 mg by mouth daily.     [name]  aspirin EC 81 MG tablet  Take 81 mg by mouth daily.     .  calcium carbonate (TUMS - DOSED IN MG ELEMENTAL CALCIUM) 500 MG chewable tablet  Chew 1 tablet by mouth 2 (two) times daily. Phosphorous binder     .  ezetimibe-simvastatin  (VYTORIN) 10-40 MG per tablet  Take 1 tablet by mouth at bedtime.  30 tablet  0   .  glimepiride (AMARYL) 4 MG tablet  Take 2 mg by mouth daily before breakfast.     .  metoprolol tartrate (LOPRESSOR) 25 MG tablet  Take 0.5 tablets (12.5 mg total) by mouth daily at 6 PM. ##CHECK BLOOD PRESSURE BEFORE TAKING THIS MEDICATION. IF THE TOP NUMBER (SBP) IS LESS THAN 100 DO NOT TAKE IT.  30 tablet  1   .  warfarin (COUMADIN) 5 MG tablet  Take 1/2 tablet on mon, wed, fri. And whole tablet on tue, thur, sat, sun.  30 tablet  0   .  estazolam (PROSOM) 2 MG tablet  Take 1 mg by mouth at bedtime as needed. For sleep     .  gabapentin (NEURONTIN) 100 MG capsule  Take 1 capsule (100 mg total) by mouth at bedtime.  30 capsule  1   .  Nutritional Supplements (FEEDING SUPPLEMENT, NEPRO CARB STEADY,) LIQD  Take 237 mLs by mouth 3 (three) times daily as needed.     .  silver sulfADIAZINE (SILVADENE) 1 % cream  Apply 1 application topically daily.     .  traMADol (ULTRAM) 50 MG tablet  Take 50 mg by mouth 2 (two) times daily as needed. pain      REVIEW OF SYSTEMS:  No changes from prior visit  PHYSICAL EXAMINATION:  Vital signs are BP 137/77  Pulse 85  Resp 18  Ht 6' (1.829 m)  Wt 239 lb (108.41 kg)  BMI 32.41 kg/m2  SpO2 98%  General: The patient appears their stated age.  HEENT: No gross abnormalities  Pulmonary: Non labored breathing  Musculoskeletal: There are no major deformities.  Neurologic: No focal weakness or paresthesias are detected,  Skin: The left below knee" site is healing nicely. There is no drainage. The staples are still intact. There is no erythema.  On the right there is a healing ulcer on the dorsum of the third toe. There is a open wound on the lateral malleolus approximately 2 mm.  Psychiatric: The patient has normal affect.  Cardiovascular: There is a regular rate and rhythm without significant murmur appreciated.  Diagnostic Studies  None  Assessment:  Status post left  below-knee amputation  Right leg ulcer  Plan:  The patient's staples have been removed today from his below knee amputation on the left. I have discussed that he may have some skin separation once the staples are removed and for that reason I am placing Steri-Strips. He may have to deal with some wound healing issues but ultimately I believe his below knee dictation will heal.  His right leg ulcers have remained stable however now that we have addressed the left leg I think we need to turn attention back towards the right. I have reviewed his angiogram today. He has occlusion of all 3 tibial vessels. There is reconstitution of the peroneal artery which is the dominant inflow to the foot.I previously tried to cross his occlusion, but was unsuccessful, therefore, he comes back today for another attempt.  Jorge Ny, M.D.  [facility] [place]  Office: [phone]  Pager: [phone]

## 2011-10-11 ENCOUNTER — Telehealth: Payer: Self-pay | Admitting: Surgery

## 2011-10-11 ENCOUNTER — Encounter (HOSPITAL_COMMUNITY): Payer: Self-pay

## 2011-10-11 NOTE — Telephone Encounter (Addendum)
Message copied by Rosalyn Charters on Thu Oct 11, 2011 11:13 AM ------      Message from: Melene Plan      Created: Thu Oct 11, 2011  9:18 AM                   ----- Message -----         From: Nada Libman, MD         Sent: 10/11/2011   7:35 AM           To: Reuel Derby, Melene Plan, RN            10/10/2011, the patient had the following procedures:             1.  ultrasound access left common femoral artery       2.  additional order catheterization (peroneal artery)       3.  right lower extremity angiogram with the catheter in the popliteal and peroneal artery       4.  failed angioplasty right peroneal artery                  He needs to have an appointment with the wound center this week. This is for a right leg nonhealing ulcer. I would like to see him back in the office in 6 weeks   NOTIFIED PATIENT OF FU APPT. WITH [facility] ON 11-19-11 8:30 AND THAT HE WILL BE HEARING FROM WOUND CARE CENTER

## 2011-10-11 NOTE — Op Note (Signed)
[facility]  Patient name: Alan [name] MRN: [id number] DOB: 01/07/1932 Sex: male  10/10/2011 Pre-operative Diagnosis: Right lower extremity ulcer Post-operative diagnosis:  Same Surgeon:  Jorge Ny Procedure Performed:  1.  ultrasound access left common femoral artery  2.  additional order catheterization (peroneal artery)  3.  right lower extremity angiogram with the catheter in the popliteal and peroneal artery  4.  failed angioplasty right peroneal artery   Indications:  The patient has previously undergone left leg and the patient. He has a chronic right lateral malleolus ulcer. He has previously undergone an attempt at percutaneous recanalization which was unsuccessful. He comes in for a repeat attempt as he has a very poor operative candidate for surgical bypass.   Procedure:  The patient was identified in the holding area and taken to room 8.  The patient was then placed supine on the table and prepped and draped in the usual sterile fashion.  A time out was called.  Ultrasound was used to evaluate the left common femoral artery.  It was patent .  A digital ultrasound image was acquired.  A micropuncture needle was used to access the left common femoral artery under ultrasound guidance.  An 018 wire was advanced without resistance and a micropuncture sheath was placed.  The 018 wire was removed and a benson wire was placed.  The micropuncture sheath was exchanged for a 6 french sheath.  An omniflush catheter was advanced over the wire and used to to cross the aortic bifurcation. Over a Rosen wire a 6 French 90 cm sheath was inserted down to the level of the right popliteal artery. A right leg arteriogram was performed from the sheath in the popliteal artery. This revealed total occlusion of all 3 runoff vessels. The peroneal artery does recanalize and is the main source of blood flow to the foot. At this point the patient was fully heparinized.  I then used a 014 cougar wire and a 014 quick cross catheter to select the peroneal artery origin. I attempted to perform a subintimal recanalization. I tried multiple different 014 wire is including a miracle brothers, a confianza, as well as using a 2 x 2 Fox SV balloon as a support catheter. Despite having the sheath into the popliteal artery this was not enough support to advance the catheter down to where the artery reconstituted. After multiple attempts I deemed this to not be possible and elected to terminate the procedure. The longest 6 sheath was exchanged out for a short 6 sheath. The patient was taken to the holding area for removal once his coagulation profile corrected.   Impression:  #1   Unsuccessful recanalization of the right peroneal artery.  #2   the patient is a very poor candidate for surgical revascularization, and therefore he will be referred for wound care   V. Durene Cal, M.D. [facility] [place] Office: [phone] Pager:  [phone]

## 2011-10-18 LAB — PROTIME-INR: INR: 1.4 — AB (ref 0.9–1.1)

## 2011-10-22 ENCOUNTER — Encounter (HOSPITAL_BASED_OUTPATIENT_CLINIC_OR_DEPARTMENT_OTHER): Payer: MEDICARE | Attending: General Surgery

## 2011-10-22 DIAGNOSIS — I252 Old myocardial infarction: Secondary | ICD-10-CM | POA: Insufficient documentation

## 2011-10-22 DIAGNOSIS — T8789 Other complications of amputation stump: Secondary | ICD-10-CM | POA: Insufficient documentation

## 2011-10-22 DIAGNOSIS — Z79899 Other long term (current) drug therapy: Secondary | ICD-10-CM | POA: Insufficient documentation

## 2011-10-22 DIAGNOSIS — I129 Hypertensive chronic kidney disease with stage 1 through stage 4 chronic kidney disease, or unspecified chronic kidney disease: Secondary | ICD-10-CM | POA: Insufficient documentation

## 2011-10-22 DIAGNOSIS — Y835 Amputation of limb(s) as the cause of abnormal reaction of the patient, or of later complication, without mention of misadventure at the time of the procedure: Secondary | ICD-10-CM | POA: Insufficient documentation

## 2011-10-22 DIAGNOSIS — L97309 Non-pressure chronic ulcer of unspecified ankle with unspecified severity: Secondary | ICD-10-CM | POA: Insufficient documentation

## 2011-10-22 DIAGNOSIS — E119 Type 2 diabetes mellitus without complications: Secondary | ICD-10-CM | POA: Insufficient documentation

## 2011-10-22 DIAGNOSIS — Z992 Dependence on renal dialysis: Secondary | ICD-10-CM | POA: Insufficient documentation

## 2011-10-22 DIAGNOSIS — S88119A Complete traumatic amputation at level between knee and ankle, unspecified lower leg, initial encounter: Secondary | ICD-10-CM | POA: Insufficient documentation

## 2011-10-22 DIAGNOSIS — N189 Chronic kidney disease, unspecified: Secondary | ICD-10-CM | POA: Insufficient documentation

## 2011-10-22 DIAGNOSIS — Z8673 Personal history of transient ischemic attack (TIA), and cerebral infarction without residual deficits: Secondary | ICD-10-CM | POA: Insufficient documentation

## 2011-10-22 DIAGNOSIS — Z7901 Long term (current) use of anticoagulants: Secondary | ICD-10-CM | POA: Insufficient documentation

## 2011-10-22 NOTE — Progress Notes (Signed)
[facility]  NAME:  Alan [name], Alan [name]            ACCOUNT NO.:  [id number]  MEDICAL RECORD NO.:  [id number]      DATE OF BIRTH:  May 02, 1931  PHYSICIAN:  Ardath Sax, M.D.           VISIT DATE:                                  OFFICE VISIT   Alan [name] is a very unfortunate African American male who has diabetes and hypertension.  He comes here to [place] after having had a left BKA amputation and he has got a couple of openings from where the wound is dehisced that are not really that big a problem.  His biggest problem is that not only with the diabetes and hypertension, but he also is on dialysis.  He has been worked up by the vascular surgeons and they feel there is not anything they can do due to the severity of his disease. He is on allopurinol, aspirin, Vytorin, Amaryl, Lopressor and Coumadin. He comes to [place] with a temperature of 98.6, pulse 79, respirations 16, blood pressure 143/63.  He weighs 240 pounds.  He has several ulcers on his right ankle, mostly on the left lateral malleolus, he has an area about a cm in diameter, that is reasonably clean.  We washed up all these wounds and treated them with Endoform and wrapped them in a Profore Lite.  I think that Endoform should help [place] heal these.  It may take quite a long time because of all his various illnesses especially the diabetes and renal failure.  He has also had a stroke and a myocardial infarction.  He has had open heart surgery.  So we will see him in a week and see how the Endoform helps him.  He goes to dialysis every Tuesday, Thursday and Saturday, so we will seem him on Monday.     Ardath Sax, M.D.     PP/MEDQ  D:  10/22/2011  T:  10/22/2011  Job:  [id number]

## 2011-10-23 ENCOUNTER — Ambulatory Visit: Payer: Self-pay | Admitting: Cardiology

## 2011-10-23 DIAGNOSIS — Z7901 Long term (current) use of anticoagulants: Secondary | ICD-10-CM

## 2011-10-23 DIAGNOSIS — I4891 Unspecified atrial fibrillation: Secondary | ICD-10-CM

## 2011-11-05 ENCOUNTER — Ambulatory Visit (HOSPITAL_COMMUNITY)
Admission: RE | Admit: 2011-11-05 | Discharge: 2011-11-05 | Disposition: A | Discharge status: Home / Self Care | Payer: MEDICARE | Source: Ambulatory Visit | Attending: General Surgery | Admitting: General Surgery

## 2011-11-12 ENCOUNTER — Encounter (HOSPITAL_BASED_OUTPATIENT_CLINIC_OR_DEPARTMENT_OTHER): Payer: MEDICARE | Attending: General Surgery

## 2011-11-12 DIAGNOSIS — L97509 Non-pressure chronic ulcer of other part of unspecified foot with unspecified severity: Secondary | ICD-10-CM | POA: Insufficient documentation

## 2011-11-12 DIAGNOSIS — L97309 Non-pressure chronic ulcer of unspecified ankle with unspecified severity: Secondary | ICD-10-CM | POA: Insufficient documentation

## 2011-11-12 DIAGNOSIS — E1169 Type 2 diabetes mellitus with other specified complication: Secondary | ICD-10-CM | POA: Insufficient documentation

## 2011-11-16 ENCOUNTER — Encounter: Payer: Self-pay | Admitting: Surgery

## 2011-11-19 ENCOUNTER — Ambulatory Visit: Payer: MEDICARE | Admitting: Surgery

## 2011-11-19 ENCOUNTER — Encounter (HOSPITAL_BASED_OUTPATIENT_CLINIC_OR_DEPARTMENT_OTHER): Payer: MEDICARE

## 2011-11-23 ENCOUNTER — Encounter: Payer: Self-pay | Admitting: Surgery

## 2011-11-26 ENCOUNTER — Other Ambulatory Visit: Payer: Self-pay

## 2011-11-26 ENCOUNTER — Ambulatory Visit: Payer: Self-pay | Admitting: Cardiology

## 2011-11-26 ENCOUNTER — Encounter: Payer: Self-pay | Admitting: Surgery

## 2011-11-26 ENCOUNTER — Ambulatory Visit (INDEPENDENT_AMBULATORY_CARE_PROVIDER_SITE_OTHER): Payer: MEDICARE | Admitting: Surgery

## 2011-11-26 VITALS — BP 133/48 | HR 60 | Temp 97.8°F | Resp 16 | Ht 72.0 in | Wt 231.0 lb

## 2011-11-26 DIAGNOSIS — I739 Peripheral vascular disease, unspecified: Secondary | ICD-10-CM

## 2011-11-26 DIAGNOSIS — Z48812 Encounter for surgical aftercare following surgery on the circulatory system: Secondary | ICD-10-CM

## 2011-11-26 DIAGNOSIS — Z7901 Long term (current) use of anticoagulants: Secondary | ICD-10-CM

## 2011-11-26 DIAGNOSIS — L98499 Non-pressure chronic ulcer of skin of other sites with unspecified severity: Secondary | ICD-10-CM

## 2011-11-26 DIAGNOSIS — I4891 Unspecified atrial fibrillation: Secondary | ICD-10-CM

## 2011-11-26 NOTE — Progress Notes (Signed)
[facility] [place]   Patient name: Alan [name] MRN: [id number] DOB: 06/25/1931 Sex: male     Chief Complaint  Patient presents with  . Follow-up    Right foot doing better, wound clinic is seeing him and did skin graft last week. Will see them after he leaves today.    HISTORY OF PRESENT ILLNESS: The patient is here today for followup. He has undergone left below-knee amputation for nonhealing wounds. He also has a wound on his right foot. On 10/11/2011, he underwent an attempt at percutaneous revascularization which was unsuccessful. He is a very poor operative candidate, and therefore he was referred for wound care. He has recently undergone a artificial skin graft to his right ankle. His right second toe has nearly healed. He is also complaining today without difficulty with dialysis access. He states that the yellow light is on during his treatment, and his treatment is taking approximately 5 hours.  Past Medical History  Diagnosis Date  . Diabetes mellitus   . Gout   . Hypertension   . Hyperlipidemia   . Chronic kidney disease     Started dialysis October 2012  . Arthritis   . Prostate cancer     s/p seed implant  . Cataract     BILATERAL-BEEN REMOVED  . Neuromuscular disorder     DABETIC NEUROPATHY-LOWER EXTREMITY  . Atrial fibrillation     on coumadin  . Aortic stenosis     moderate to severe, not felt to be a surgical candidate  . Anemia     secondary to end stage renal disease  . Coronary artery disease     remote CABG in 1996; last stress test in 2005; last cath 2005  . Diabetic neuropathy   . Edema   . PAD (peripheral artery disease)   . Diabetic retinopathy(362.0)   . Chronic anticoagulation     on coumadin  . Nephrolithiasis     prior history of percutaneous nephrostomy  . Shortness of breath     Past Surgical History  Procedure Date  . Ventral hernia repair   . Inguinal hernia repair     right side  . Colonoscopy   .  Kidney stone surgery   . Polypectomy   . Tonsillectomy   . Toe surgery     removal little toe right foot  . Cardiac catheterization 04/19/2003    SEVERE 2 VESSEL OBSTRUCTIVE ATHERSCLEROTIC CAD  . Coronary artery bypass graft 1996    LIMA GRAFT TO THE LAD, SEQUENTIAL SAPHENOUS  VEIN GRAFT TO THE FIRST AND SECOND DIAGONIAL BRANCHES, SEQUENTIAL SAPHENOUS VEIN GRAFT TO THE ACUTE MARGINAL, POSTERIOR DESCENDING, AND POSTERIOR LATERAL BRANCHES OF THE RIGHT CORONARY ARTERY  . Radioactive seed implant   . Us echocardiography 09/06/2009    EF 55-60%  . Cardiovascular stress test 04/13/2003    EF 54%. EVIDENCE OF ANTERO-APICAL ISCHEMIA. NORMAL LV SIZE AND FUNCTION  . Tee without cardioversion 05/09/2011    Procedure: TRANSESOPHAGEAL ECHOCARDIOGRAM (TEE);  Surgeon: Dalton S McLean, MD;  Location: [facility] ENDOSCOPY;  Service: Cardiovascular;  Laterality: N/A;  . Hernia repair   . Amputation 07/20/2011    Procedure: AMPUTATION BELOW KNEE;  Surgeon: Tyria Springer W Lawanna Cecere, MD;  Location: [facility] OR;  Service: Vascular;  Laterality: Left;    History   Social History  . Marital Status: Married    Spouse Name: N/A    Number of Children: N/A  . Years of Education: N/A   Occupational History  .   Not on file.   Social History Main Topics  . Smoking status: Former Smoker -- 2 years    Types: Cigarettes    Quit date: 04/10/1968  . Smokeless tobacco: Former User    Types: Chew  . Alcohol Use: No  . Drug Use: No  . Sexually Active: No   Other Topics Concern  . Not on file   Social History Narrative  . No narrative on file    Family History  Problem Relation Age of Onset  . Heart disease Father   . Hypertension Brother   . Stroke Brother   . Diabetes Mother     Pt. not sure, but he thinks she did.    Allergies as of 11/26/2011 - Review Complete 11/26/2011  Allergen Reaction Noted  . Ancef (cefazolin) Rash 05/18/2011    Current Outpatient Prescriptions on File Prior to Visit  Medication Sig Dispense  Refill  . allopurinol (ZYLOPRIM) 300 MG tablet Take 150 mg by mouth daily.      . aspirin EC 81 MG tablet Take 81 mg by mouth daily.      . calcium carbonate (TUMS - DOSED IN MG ELEMENTAL CALCIUM) 500 MG chewable tablet Chew 1 tablet by mouth 3 (three) times daily with meals. Phosphorous binder      . estazolam (PROSOM) 2 MG tablet Take 1 mg by mouth at bedtime as needed. For sleep      . glimepiride (AMARYL) 4 MG tablet Take 2 mg by mouth daily before breakfast.        . metoprolol tartrate (LOPRESSOR) 25 MG tablet Take 37.5 mg by mouth 2 (two) times daily.      . simvastatin (ZOCOR) 40 MG tablet       . warfarin (COUMADIN) 5 MG tablet Take 2.5-5 mg by mouth daily. Take 1/2 tab (2.5mg) on Monday, Wednesday, and Friday. Then take 1 tab (5mg) all other days      . ezetimibe-simvastatin (VYTORIN) 10-40 MG per tablet Take 1 tablet by mouth at bedtime.  30 tablet  0     REVIEW OF SYSTEMS: Positive for swelling in his right leg and weakness in numbness in his right leg. All other systems are negative as documented by the patient and the encounter form.  PHYSICAL EXAMINATION:   Vital signs are BP 133/48  Pulse 60  Temp 97.8 F (36.6 C) (Oral)  Resp 16  Ht 6' (1.829 m)  Wt 231 lb (104.781 kg)  BMI 31.33 kg/m2  SpO2 98% General: The patient appears their stated age. HEENT:  No gross abnormalities Pulmonary:  Non labored breathing Abdomen: Soft and non-tender Musculoskeletal: Left below knee amputation stump is well healed Neurologic: No focal weakness or paresthesias are detected, Skin: The right ankle dressing was not removed. The left toe ulcer has nearly healed. Psychiatric: The patient has normal affect. Cardiovascular: Pedal pulses are not palpable   Diagnostic Studies None  Assessment: #1: End-stage renal disease #2: Peripheral vascular disease Plan: #1: I have reviewed his most recent fistulogram soft. There is a stenosis within the mid cephalic vein that has been treated  several times. There is also a narrowing within the vein at the level of the arteriovenous anastomosis that does not appear to have been treated. Since he is having difficulty with his [facility] dialysis, I recommended repeating his fistulogram. L. plan on accessing his fistula in the upper arm and treating the mid cephalic vein stenosis as well as a stenosis at the arterial venous anastomosis.   This is been scheduled for December 4. He will need to be off of his Coumadin for 4 days prior. #2: The patient is a very poor operative candidate and therefore I am treating him with wound care. Fortunately, he seems to be doing very well with this. The right toe ulcer has nearly healed and according to him the right ankle ulcer is also almost completely healed. He will followup with me regarding these issues in 3 months  V. Wells Evany Schecter IV, M.D. [facility] [place] Office: [phone] Pager:  [phone]   

## 2011-11-30 ENCOUNTER — Telehealth: Payer: Self-pay

## 2011-11-30 NOTE — Telephone Encounter (Signed)
Patient called was told received a message from Dr.Brabham's nurse Okey Regal wanting to know if ok to hold coumadin 5 days before rt arm fistulogram/possible intervention 12/12/11.Spoke to Dr.Jordan he advised ok to hold coumadin 5 days prior to procedure and patient does not need lovenox bridge.Message sent to [facility] RN.

## 2011-12-07 ENCOUNTER — Ambulatory Visit: Payer: Self-pay | Admitting: Cardiovascular Disease

## 2011-12-07 DIAGNOSIS — I4891 Unspecified atrial fibrillation: Secondary | ICD-10-CM

## 2011-12-07 DIAGNOSIS — Z7901 Long term (current) use of anticoagulants: Secondary | ICD-10-CM

## 2011-12-10 ENCOUNTER — Encounter (HOSPITAL_BASED_OUTPATIENT_CLINIC_OR_DEPARTMENT_OTHER): Payer: MEDICARE | Attending: General Surgery

## 2011-12-10 ENCOUNTER — Encounter (HOSPITAL_BASED_OUTPATIENT_CLINIC_OR_DEPARTMENT_OTHER): Payer: MEDICARE

## 2011-12-10 DIAGNOSIS — L97309 Non-pressure chronic ulcer of unspecified ankle with unspecified severity: Secondary | ICD-10-CM | POA: Insufficient documentation

## 2011-12-10 DIAGNOSIS — L97509 Non-pressure chronic ulcer of other part of unspecified foot with unspecified severity: Secondary | ICD-10-CM | POA: Insufficient documentation

## 2011-12-10 DIAGNOSIS — E1169 Type 2 diabetes mellitus with other specified complication: Secondary | ICD-10-CM | POA: Insufficient documentation

## 2011-12-11 ENCOUNTER — Encounter (HOSPITAL_BASED_OUTPATIENT_CLINIC_OR_DEPARTMENT_OTHER): Payer: MEDICARE

## 2011-12-11 MED ORDER — SODIUM CHLORIDE 0.9 % IJ SOLN: 3.0000 mL | INTRAMUSCULAR | Status: DC | PRN

## 2011-12-12 ENCOUNTER — Encounter (HOSPITAL_COMMUNITY)
Admission: RE | Disposition: A | Discharge status: Home / Self Care | Payer: Self-pay | Source: Ambulatory Visit | Attending: Surgery

## 2011-12-12 ENCOUNTER — Ambulatory Visit (HOSPITAL_COMMUNITY)
Admission: RE | Admit: 2011-12-12 | Discharge: 2011-12-12 | Disposition: A | Discharge status: Home / Self Care | Payer: MEDICARE | Source: Ambulatory Visit | Attending: Surgery | Admitting: Surgery

## 2011-12-12 DIAGNOSIS — Z992 Dependence on renal dialysis: Secondary | ICD-10-CM | POA: Insufficient documentation

## 2011-12-12 DIAGNOSIS — E119 Type 2 diabetes mellitus without complications: Secondary | ICD-10-CM | POA: Insufficient documentation

## 2011-12-12 DIAGNOSIS — T82898A Other specified complication of vascular prosthetic devices, implants and grafts, initial encounter: Secondary | ICD-10-CM | POA: Insufficient documentation

## 2011-12-12 DIAGNOSIS — N186 End stage renal disease: Secondary | ICD-10-CM | POA: Insufficient documentation

## 2011-12-12 DIAGNOSIS — I120 Hypertensive chronic kidney disease with stage 5 chronic kidney disease or end stage renal disease: Secondary | ICD-10-CM | POA: Insufficient documentation

## 2011-12-12 DIAGNOSIS — I871 Compression of vein: Secondary | ICD-10-CM | POA: Insufficient documentation

## 2011-12-12 DIAGNOSIS — Y832 Surgical operation with anastomosis, bypass or graft as the cause of abnormal reaction of the patient, or of later complication, without mention of misadventure at the time of the procedure: Secondary | ICD-10-CM | POA: Insufficient documentation

## 2011-12-12 HISTORY — PX: SHUNTOGRAM: SHX5491

## 2011-12-12 LAB — POCT I-STAT, CHEM 8
BUN: 30 mg/dL — ABNORMAL HIGH (ref 6–23)
Creatinine, Ser: 5.2 mg/dL — ABNORMAL HIGH (ref 0.50–1.35)
Glucose, Bld: 80 mg/dL (ref 70–99)
Hemoglobin: 12.2 g/dL — ABNORMAL LOW (ref 13.0–17.0)
Sodium: 141 mEq/L (ref 135–145)
TCO2: 30 mmol/L (ref 0–100)

## 2011-12-12 LAB — PROTIME-INR
INR: 1.05 (ref 0.00–1.49)
Prothrombin Time: 13.6 seconds (ref 11.6–15.2)

## 2011-12-12 LAB — APTT: aPTT: 29 seconds (ref 24–37)

## 2011-12-12 LAB — GLUCOSE, CAPILLARY: Glucose-Capillary: 66 mg/dL — ABNORMAL LOW (ref 70–99)

## 2011-12-12 SURGERY — ASSESSMENT, SHUNT FUNCTION, WITH CONTRAST RADIOGRAPHIC STUDY
Anesthesia: LOCAL | Laterality: Right

## 2011-12-12 MED ORDER — LABETALOL HCL 5 MG/ML IV SOLN: 10.0000 mg | INTRAVENOUS | Status: DC | PRN

## 2011-12-12 MED ORDER — ACETAMINOPHEN 325 MG PO TABS: 325.0000 mg | ORAL_TABLET | ORAL | Status: DC | PRN

## 2011-12-12 MED ORDER — MORPHINE SULFATE 10 MG/ML IJ SOLN: 2.0000 mg | INTRAMUSCULAR | Status: DC | PRN

## 2011-12-12 MED ORDER — FENTANYL CITRATE 0.05 MG/ML IJ SOLN
INTRAMUSCULAR | Status: AC
  Filled 2011-12-12: qty 2

## 2011-12-12 MED ORDER — PHENOL 1.4 % MT LIQD: 1.0000 | OROMUCOSAL | Status: DC | PRN

## 2011-12-12 MED ORDER — METOPROLOL TARTRATE 1 MG/ML IV SOLN: 2.0000 mg | INTRAVENOUS | Status: DC | PRN

## 2011-12-12 MED ORDER — HEPARIN SODIUM (PORCINE) 1000 UNIT/ML IJ SOLN
INTRAMUSCULAR | Status: AC
  Filled 2011-12-12: qty 1

## 2011-12-12 MED ORDER — GUAIFENESIN-DM 100-10 MG/5ML PO SYRP: 15.0000 mL | ORAL_SOLUTION | ORAL | Status: DC | PRN

## 2011-12-12 MED ORDER — NITROGLYCERIN 0.2 MG/ML ON CALL CATH LAB
INTRAVENOUS | Status: AC
  Filled 2011-12-12: qty 1

## 2011-12-12 MED ORDER — OXYCODONE HCL 5 MG PO TABS: 5.0000 mg | ORAL_TABLET | ORAL | Status: DC | PRN

## 2011-12-12 MED ORDER — ACETAMINOPHEN 325 MG RE SUPP: 325.0000 mg | RECTAL | Status: DC | PRN

## 2011-12-12 MED ORDER — ALUM & MAG HYDROXIDE-SIMETH 200-200-20 MG/5ML PO SUSP: 15.0000 mL | ORAL | Status: DC | PRN

## 2011-12-12 MED ORDER — HEPARIN (PORCINE) IN NACL 2-0.9 UNIT/ML-% IJ SOLN
INTRAMUSCULAR | Status: AC
  Filled 2011-12-12: qty 500

## 2011-12-12 MED ORDER — LIDOCAINE HCL (PF) 1 % IJ SOLN
INTRAMUSCULAR | Status: AC
  Filled 2011-12-12: qty 30

## 2011-12-12 MED ORDER — HYDRALAZINE HCL 20 MG/ML IJ SOLN: 10.0000 mg | INTRAMUSCULAR | Status: DC | PRN

## 2011-12-12 MED ORDER — ONDANSETRON HCL 4 MG/2ML IJ SOLN: 4.0000 mg | Freq: Four times a day (QID) | INTRAMUSCULAR | Status: DC | PRN

## 2011-12-12 NOTE — Interval H&P Note (Signed)
History and Physical Interval Note:  12/12/2011 8:52 AM  Alan [name]  has presented today for surgery, with the diagnosis of instage renal  The various methods of treatment have been discussed with the patient and family. After consideration of risks, benefits and other options for treatment, the patient has consented to  Procedure(s) (LRB) with comments: SHUNTOGRAM (Right) as a surgical intervention .  The patient's history has been reviewed, patient examined, no change in status, stable for surgery.  I have reviewed the patient's chart and labs.  Questions were answered to the patient's satisfaction.     BRABHAM IV, V. WELLS

## 2011-12-12 NOTE — Op Note (Signed)
Vascular and Vein Specialists of [facility]  Patient name: Alan [name] MRN: [id number] DOB: 10-23-1931 Sex: male  12/12/2011 Pre-operative Diagnosis: End-stage renal disease Post-operative diagnosis:  Same Surgeon:  Jorge Ny Procedure Performed:  1.  ultrasound access right arm fistula  2.  fistulogram  3.  angioplasty right cephalic vein  4.  angioplasty right brachiocephalic anastomosis  5.  intra-arterial administration of nitroglycerin   Indications:  The patient is well known to me having undergone attempts at revascularization of his lower extremity. He now comes in with trouble with dialysis access. He has a history of undergoing angioplasty of the cephalic vein.  Procedure:  The patient was identified in the holding area and taken to room 8.  The patient was then placed supine on the table and prepped and draped in the usual sterile fashion.  A time out was called.  Ultrasound was used to evaluate the fistula.  The vein was patent and compressible.  A digital ultrasound image was acquired.  The fistula was then accessed under ultrasound guidance using a micropuncture needle.  An 018 wire was then asvanced without resistance and a micropuncture sheath was placed.  Contrast injections were then performed through the sheath.  Findings:  There continues to be some narrowing at the arterial venous anastomosis. The previously treated cephalic vein stenosis exists, but is not as significant as previous examinations. There is no evidence of central venous stenosis. There is a narrowing within the cephalic vein near the access site which is thought to be secondary to spasm   Intervention:  After the above images were obtained, the decision was made to intervene. A 6 French sheath was placed. The patient was given 3000 units of heparin. I used a Kumpe catheter to gain access into the brachial artery. I performed a primary angioplasty of the previously treated area using a 7 x 40  Mustang balloon. The balloon was taken to nominal pressure and held up for 1 minute. Postangioplasty results revealed decrease, and the resolution of the stenosis. I then proceeded with intervention at the arteriovenous anastomosis. I used a 5 x 20 balloon and performed angioplasty with the balloon in the brachial artery and then again with an extending up into the proximal fistula. The balloon was taken to nominal pressure and held for 1 minute with each inflation. Intra-arterial administration of nitroglycerin was performed so as to improve the spasm near the access site. Followup study revealed continued narrowing at the arterial venous anastomosis however this did look improved. There is good flow to the fistula. I did not want to increase the balloon diameter as I felt I had improve his fistula, and I was concerned about rupturing this sclerotic portion of the vein.  Impression:  #1  successful angioplasty of a mid cephalic vein stenosis, using a 7 x 40 Mustang balloon  #2  successful angioplasty of the arterial venous anastomosis using a 5 x 20 balloon  #3  no evidence of central venous stenosis   V. Durene Cal, M.D. [facility] of [place] Office: [phone] Pager:  [phone]

## 2011-12-12 NOTE — H&P (View-Only) (Signed)
Vascular and Vein Specialist of [facility]   Patient name: Alan [name] MRN: [id number] DOB: 1931-06-20 Sex: male     Chief Complaint  Patient presents with  . Follow-up    Right foot doing better, wound clinic is seeing him and did skin graft last week. Will see them after he leaves today.    HISTORY OF PRESENT ILLNESS: The patient is here today for followup. He has undergone left below-knee amputation for nonhealing wounds. He also has a wound on his right foot. On 10/11/2011, he underwent an attempt at percutaneous revascularization which was unsuccessful. He is a very poor operative candidate, and therefore he was referred for wound care. He has recently undergone a artificial skin graft to his right ankle. His right second toe has nearly healed. He is also complaining today without difficulty with dialysis access. He states that the yellow light is on during his treatment, and his treatment is taking approximately 5 hours.  Past Medical History  Diagnosis Date  . Diabetes mellitus   . Gout   . Hypertension   . Hyperlipidemia   . Chronic kidney disease     Started dialysis October 2012  . Arthritis   . Prostate cancer     s/p seed implant  . Cataract     BILATERAL-BEEN REMOVED  . Neuromuscular disorder     DABETIC NEUROPATHY-LOWER EXTREMITY  . Atrial fibrillation     on coumadin  . Aortic stenosis     moderate to severe, not felt to be a surgical candidate  . Anemia     secondary to end stage renal disease  . Coronary artery disease     remote CABG in 1996; last stress test in 2005; last cath 2005  . Diabetic neuropathy   . Edema   . PAD (peripheral artery disease)   . Diabetic retinopathy(362.0)   . Chronic anticoagulation     on coumadin  . Nephrolithiasis     prior history of percutaneous nephrostomy  . Shortness of breath     Past Surgical History  Procedure Date  . Ventral hernia repair   . Inguinal hernia repair     right side  . Colonoscopy   .  Kidney stone surgery   . Polypectomy   . Tonsillectomy   . Toe surgery     removal little toe right foot  . Cardiac catheterization 04/19/2003    SEVERE 2 VESSEL OBSTRUCTIVE ATHERSCLEROTIC CAD  . Coronary artery bypass graft 1996    LIMA GRAFT TO THE LAD, SEQUENTIAL SAPHENOUS  VEIN GRAFT TO THE FIRST AND SECOND DIAGONIAL BRANCHES, SEQUENTIAL SAPHENOUS VEIN GRAFT TO THE ACUTE MARGINAL, POSTERIOR DESCENDING, AND POSTERIOR LATERAL BRANCHES OF THE RIGHT CORONARY ARTERY  . Radioactive seed implant   . US echocardiography 09/06/2009    EF 55-60%  . Cardiovascular stress test 04/13/2003    EF 54%. EVIDENCE OF ANTERO-APICAL ISCHEMIA. NORMAL LV SIZE AND FUNCTION  . Tee without cardioversion 05/09/2011    Procedure: TRANSESOPHAGEAL ECHOCARDIOGRAM (TEE);  Surgeon: Laurey Morale, MD;  Location: [facility] ENDOSCOPY;  Service: Cardiovascular;  Laterality: N/A;  . Hernia repair   . Amputation 07/20/2011    Procedure: AMPUTATION BELOW KNEE;  Surgeon: Nada Libman, MD;  Location: [facility] OR;  Service: Vascular;  Laterality: Left;    History   Social History  . Marital Status: Married    Spouse Name: N/A    Number of Children: N/A  . Years of Education: N/A   Occupational History  .  Not on file.   Social History Main Topics  . Smoking status: Former Smoker -- 2 years    Types: Cigarettes    Quit date: 04/10/1968  . Smokeless tobacco: Former Neurosurgeon    Types: Chew  . Alcohol Use: No  . Drug Use: No  . Sexually Active: No   Other Topics Concern  . Not on file   Social History Narrative  . No narrative on file    Family History  Problem Relation Age of Onset  . Heart disease Father   . Hypertension Brother   . Stroke Brother   . Diabetes Mother     Pt. not sure, but he thinks she did.    Allergies as of 11/26/2011 - Review Complete 11/26/2011  Allergen Reaction Noted  . Ancef (cefazolin) Rash 05/18/2011    Current Outpatient Prescriptions on File Prior to Visit  Medication Sig Dispense  Refill  . allopurinol (ZYLOPRIM) 300 MG tablet Take 150 mg by mouth daily.      [name] aspirin EC 81 MG tablet Take 81 mg by mouth daily.      . calcium carbonate (TUMS - DOSED IN MG ELEMENTAL CALCIUM) 500 MG chewable tablet Chew 1 tablet by mouth 3 (three) times daily with meals. Phosphorous binder      . estazolam (PROSOM) 2 MG tablet Take 1 mg by mouth at bedtime as needed. For sleep      . glimepiride (AMARYL) 4 MG tablet Take 2 mg by mouth daily before breakfast.        . metoprolol tartrate (LOPRESSOR) 25 MG tablet Take 37.5 mg by mouth 2 (two) times daily.      . simvastatin (ZOCOR) 40 MG tablet       . warfarin (COUMADIN) 5 MG tablet Take 2.5-5 mg by mouth daily. Take 1/2 tab (2.5mg ) on Monday, Wednesday, and Friday. Then take 1 tab (5mg ) all other days      . ezetimibe-simvastatin (VYTORIN) 10-40 MG per tablet Take 1 tablet by mouth at bedtime.  30 tablet  0     REVIEW OF SYSTEMS: Positive for swelling in his right leg and weakness in numbness in his right leg. All other systems are negative as documented by the patient and the encounter form.  PHYSICAL EXAMINATION:   Vital signs are BP 133/48  Pulse 60  Temp 97.8 F (36.6 C) (Oral)  Resp 16  Ht 6' (1.829 m)  Wt 231 lb (104.781 kg)  BMI 31.33 kg/m2  SpO2 98% General: The patient appears their stated age. HEENT:  No gross abnormalities Pulmonary:  Non labored breathing Abdomen: Soft and non-tender Musculoskeletal: Left below knee amputation stump is well healed Neurologic: No focal weakness or paresthesias are detected, Skin: The right ankle dressing was not removed. The left toe ulcer has nearly healed. Psychiatric: The patient has normal affect. Cardiovascular: Pedal pulses are not palpable   Diagnostic Studies None  Assessment: #1: End-stage renal disease #2: Peripheral vascular disease Plan: #1: I have reviewed his most recent fistulogram soft. There is a stenosis within the mid cephalic vein that has been treated  several times. There is also a narrowing within the vein at the level of the arteriovenous anastomosis that does not appear to have been treated. Since he is having difficulty with his [facility] dialysis, I recommended repeating his fistulogram. L. plan on accessing his fistula in the upper arm and treating the mid cephalic vein stenosis as well as a stenosis at the arterial venous anastomosis.  This is been scheduled for December 4. He will need to be off of his Coumadin for 4 days prior. #2: The patient is a very poor operative candidate and therefore I am treating him with wound care. Fortunately, he seems to be doing very well with this. The right toe ulcer has nearly healed and according to him the right ankle ulcer is also almost completely healed. He will followup with me regarding these issues in 3 months  V. Charlena Cross, M.D. [facility] of [place] Office: [phone] Pager:  [phone]

## 2011-12-27 ENCOUNTER — Ambulatory Visit: Payer: Self-pay | Admitting: Cardiovascular Disease

## 2011-12-27 DIAGNOSIS — Z7901 Long term (current) use of anticoagulants: Secondary | ICD-10-CM

## 2011-12-27 DIAGNOSIS — I4891 Unspecified atrial fibrillation: Secondary | ICD-10-CM

## 2012-01-08 LAB — PROTIME-INR: INR: 2.4 — AB (ref 0.9–1.1)

## 2012-01-10 ENCOUNTER — Ambulatory Visit: Payer: Self-pay | Admitting: Cardiology

## 2012-01-10 DIAGNOSIS — I4891 Unspecified atrial fibrillation: Secondary | ICD-10-CM

## 2012-01-10 DIAGNOSIS — Z7901 Long term (current) use of anticoagulants: Secondary | ICD-10-CM

## 2012-01-11 ENCOUNTER — Encounter (HOSPITAL_BASED_OUTPATIENT_CLINIC_OR_DEPARTMENT_OTHER): Payer: MEDICARE | Attending: General Surgery

## 2012-01-11 DIAGNOSIS — I96 Gangrene, not elsewhere classified: Secondary | ICD-10-CM | POA: Insufficient documentation

## 2012-01-11 DIAGNOSIS — L97309 Non-pressure chronic ulcer of unspecified ankle with unspecified severity: Secondary | ICD-10-CM | POA: Insufficient documentation

## 2012-01-11 DIAGNOSIS — E1169 Type 2 diabetes mellitus with other specified complication: Secondary | ICD-10-CM | POA: Insufficient documentation

## 2012-01-11 DIAGNOSIS — E1159 Type 2 diabetes mellitus with other circulatory complications: Secondary | ICD-10-CM | POA: Insufficient documentation

## 2012-01-11 DIAGNOSIS — S88119A Complete traumatic amputation at level between knee and ankle, unspecified lower leg, initial encounter: Secondary | ICD-10-CM | POA: Insufficient documentation

## 2012-01-14 ENCOUNTER — Encounter (HOSPITAL_BASED_OUTPATIENT_CLINIC_OR_DEPARTMENT_OTHER): Payer: MEDICARE

## 2012-01-15 ENCOUNTER — Encounter (HOSPITAL_BASED_OUTPATIENT_CLINIC_OR_DEPARTMENT_OTHER): Payer: MEDICARE

## 2012-01-21 ENCOUNTER — Inpatient Hospital Stay (HOSPITAL_COMMUNITY)
Admission: EM | Admit: 2012-01-21 | Discharge: 2012-01-28 | DRG: 853 | Disposition: A | Discharge status: Rehabilitation Facility | Payer: MEDICARE | Attending: Internal Medicine | Admitting: Internal Medicine

## 2012-01-21 ENCOUNTER — Encounter (HOSPITAL_COMMUNITY): Payer: Self-pay

## 2012-01-21 ENCOUNTER — Ambulatory Visit: Payer: MEDICARE | Admitting: Physical Therapy

## 2012-01-21 DIAGNOSIS — Z48812 Encounter for surgical aftercare following surgery on the circulatory system: Secondary | ICD-10-CM

## 2012-01-21 DIAGNOSIS — E1142 Type 2 diabetes mellitus with diabetic polyneuropathy: Secondary | ICD-10-CM | POA: Diagnosis present

## 2012-01-21 DIAGNOSIS — Z7901 Long term (current) use of anticoagulants: Secondary | ICD-10-CM

## 2012-01-21 DIAGNOSIS — Z7982 Long term (current) use of aspirin: Secondary | ICD-10-CM

## 2012-01-21 DIAGNOSIS — E119 Type 2 diabetes mellitus without complications: Secondary | ICD-10-CM

## 2012-01-21 DIAGNOSIS — E1139 Type 2 diabetes mellitus with other diabetic ophthalmic complication: Secondary | ICD-10-CM | POA: Diagnosis present

## 2012-01-21 DIAGNOSIS — I739 Peripheral vascular disease, unspecified: Secondary | ICD-10-CM | POA: Diagnosis present

## 2012-01-21 DIAGNOSIS — E785 Hyperlipidemia, unspecified: Secondary | ICD-10-CM | POA: Diagnosis present

## 2012-01-21 DIAGNOSIS — Z87891 Personal history of nicotine dependence: Secondary | ICD-10-CM

## 2012-01-21 DIAGNOSIS — L97509 Non-pressure chronic ulcer of other part of unspecified foot with unspecified severity: Secondary | ICD-10-CM | POA: Diagnosis present

## 2012-01-21 DIAGNOSIS — I4892 Unspecified atrial flutter: Secondary | ICD-10-CM | POA: Diagnosis present

## 2012-01-21 DIAGNOSIS — S88119A Complete traumatic amputation at level between knee and ankle, unspecified lower leg, initial encounter: Secondary | ICD-10-CM | POA: Diagnosis present

## 2012-01-21 DIAGNOSIS — R6510 Systemic inflammatory response syndrome (SIRS) of non-infectious origin without acute organ dysfunction: Secondary | ICD-10-CM | POA: Diagnosis present

## 2012-01-21 DIAGNOSIS — D72829 Elevated white blood cell count, unspecified: Secondary | ICD-10-CM | POA: Diagnosis present

## 2012-01-21 DIAGNOSIS — I7025 Atherosclerosis of native arteries of other extremities with ulceration: Secondary | ICD-10-CM | POA: Diagnosis present

## 2012-01-21 DIAGNOSIS — I120 Hypertensive chronic kidney disease with stage 5 chronic kidney disease or end stage renal disease: Secondary | ICD-10-CM | POA: Diagnosis present

## 2012-01-21 DIAGNOSIS — T148XXA Other injury of unspecified body region, initial encounter: Secondary | ICD-10-CM

## 2012-01-21 DIAGNOSIS — I4891 Unspecified atrial fibrillation: Secondary | ICD-10-CM | POA: Diagnosis present

## 2012-01-21 DIAGNOSIS — Z951 Presence of aortocoronary bypass graft: Secondary | ICD-10-CM

## 2012-01-21 DIAGNOSIS — I2510 Atherosclerotic heart disease of native coronary artery without angina pectoris: Secondary | ICD-10-CM | POA: Diagnosis present

## 2012-01-21 DIAGNOSIS — L089 Local infection of the skin and subcutaneous tissue, unspecified: Secondary | ICD-10-CM | POA: Diagnosis present

## 2012-01-21 DIAGNOSIS — D631 Anemia in chronic kidney disease: Secondary | ICD-10-CM | POA: Diagnosis present

## 2012-01-21 DIAGNOSIS — N2581 Secondary hyperparathyroidism of renal origin: Secondary | ICD-10-CM | POA: Diagnosis present

## 2012-01-21 DIAGNOSIS — I359 Nonrheumatic aortic valve disorder, unspecified: Secondary | ICD-10-CM | POA: Diagnosis present

## 2012-01-21 DIAGNOSIS — M129 Arthropathy, unspecified: Secondary | ICD-10-CM | POA: Diagnosis present

## 2012-01-21 DIAGNOSIS — I70269 Atherosclerosis of native arteries of extremities with gangrene, unspecified extremity: Secondary | ICD-10-CM | POA: Diagnosis present

## 2012-01-21 DIAGNOSIS — Z8546 Personal history of malignant neoplasm of prostate: Secondary | ICD-10-CM

## 2012-01-21 DIAGNOSIS — Z01810 Encounter for preprocedural cardiovascular examination: Secondary | ICD-10-CM

## 2012-01-21 DIAGNOSIS — I96 Gangrene, not elsewhere classified: Secondary | ICD-10-CM

## 2012-01-21 DIAGNOSIS — A419 Sepsis, unspecified organism: Secondary | ICD-10-CM

## 2012-01-21 DIAGNOSIS — I350 Nonrheumatic aortic (valve) stenosis: Secondary | ICD-10-CM | POA: Diagnosis present

## 2012-01-21 DIAGNOSIS — Z79899 Other long term (current) drug therapy: Secondary | ICD-10-CM

## 2012-01-21 DIAGNOSIS — R791 Abnormal coagulation profile: Secondary | ICD-10-CM | POA: Diagnosis present

## 2012-01-21 DIAGNOSIS — Z992 Dependence on renal dialysis: Secondary | ICD-10-CM

## 2012-01-21 DIAGNOSIS — N186 End stage renal disease: Secondary | ICD-10-CM | POA: Diagnosis present

## 2012-01-21 DIAGNOSIS — E1149 Type 2 diabetes mellitus with other diabetic neurological complication: Secondary | ICD-10-CM | POA: Diagnosis present

## 2012-01-21 DIAGNOSIS — M109 Gout, unspecified: Secondary | ICD-10-CM | POA: Diagnosis present

## 2012-01-21 DIAGNOSIS — E11319 Type 2 diabetes mellitus with unspecified diabetic retinopathy without macular edema: Secondary | ICD-10-CM | POA: Diagnosis present

## 2012-01-21 DIAGNOSIS — L98499 Non-pressure chronic ulcer of skin of other sites with unspecified severity: Secondary | ICD-10-CM

## 2012-01-21 LAB — COMPREHENSIVE METABOLIC PANEL
Albumin: 3.4 g/dL — ABNORMAL LOW (ref 3.5–5.2)
Alkaline Phosphatase: 53 U/L (ref 39–117)
BUN: 49 mg/dL — ABNORMAL HIGH (ref 6–23)
Creatinine, Ser: 8.51 mg/dL — ABNORMAL HIGH (ref 0.50–1.35)
Potassium: 4.6 mEq/L (ref 3.5–5.1)
Total Protein: 7.8 g/dL (ref 6.0–8.3)

## 2012-01-21 LAB — CBC WITH DIFFERENTIAL/PLATELET
Basophils Absolute: 0 10*3/uL (ref 0.0–0.1)
Basophils Relative: 0 % (ref 0–1)
Eosinophils Absolute: 0 10*3/uL (ref 0.0–0.7)
Hemoglobin: 11.3 g/dL — ABNORMAL LOW (ref 13.0–17.0)
MCH: 30.3 pg (ref 26.0–34.0)
MCHC: 33.6 g/dL (ref 30.0–36.0)
Monocytes Relative: 8 % (ref 3–12)
Neutrophils Relative %: 85 % — ABNORMAL HIGH (ref 43–77)
RDW: 12.9 % (ref 11.5–15.5)

## 2012-01-21 LAB — PROTIME-INR
INR: 4.18 — ABNORMAL HIGH (ref 0.00–1.49)
Prothrombin Time: 37.8 seconds — ABNORMAL HIGH (ref 11.6–15.2)

## 2012-01-21 NOTE — ED Notes (Signed)
Has also been out of cholesterol medication for past 2 days. Needs a refill

## 2012-01-21 NOTE — ED Notes (Signed)
Pt was seen at the wound clinic earlier today and the doctor thinks that his right 2nd toe might be gangrenous so he wanted him to come get it checked out.

## 2012-01-22 DIAGNOSIS — I70269 Atherosclerosis of native arteries of extremities with gangrene, unspecified extremity: Secondary | ICD-10-CM

## 2012-01-22 DIAGNOSIS — I4891 Unspecified atrial fibrillation: Secondary | ICD-10-CM | POA: Diagnosis present

## 2012-01-22 DIAGNOSIS — R6510 Systemic inflammatory response syndrome (SIRS) of non-infectious origin without acute organ dysfunction: Secondary | ICD-10-CM | POA: Diagnosis present

## 2012-01-22 DIAGNOSIS — S88119A Complete traumatic amputation at level between knee and ankle, unspecified lower leg, initial encounter: Secondary | ICD-10-CM | POA: Diagnosis present

## 2012-01-22 DIAGNOSIS — T148XXA Other injury of unspecified body region, initial encounter: Secondary | ICD-10-CM | POA: Diagnosis present

## 2012-01-22 LAB — CBC WITH DIFFERENTIAL/PLATELET
Basophils Absolute: 0 10*3/uL (ref 0.0–0.1)
Eosinophils Relative: 0 % (ref 0–5)
HCT: 28.6 % — ABNORMAL LOW (ref 39.0–52.0)
Hemoglobin: 9.5 g/dL — ABNORMAL LOW (ref 13.0–17.0)
MCH: 29.7 pg (ref 26.0–34.0)
MCHC: 33.2 g/dL (ref 30.0–36.0)
MCV: 89.4 fL (ref 78.0–100.0)
Monocytes Absolute: 1.2 10*3/uL — ABNORMAL HIGH (ref 0.1–1.0)
Monocytes Relative: 9 % (ref 3–12)

## 2012-01-22 LAB — GLUCOSE, CAPILLARY
Glucose-Capillary: 178 mg/dL — ABNORMAL HIGH (ref 70–99)
Glucose-Capillary: 188 mg/dL — ABNORMAL HIGH (ref 70–99)
Glucose-Capillary: 119 mg/dL — ABNORMAL HIGH (ref 70–99)
Glucose-Capillary: 110 mg/dL — ABNORMAL HIGH (ref 70–99)
Glucose-Capillary: 88 mg/dL (ref 70–99)
Glucose-Capillary: 166 mg/dL — ABNORMAL HIGH (ref 70–99)

## 2012-01-22 LAB — BASIC METABOLIC PANEL
BUN: 55 mg/dL — ABNORMAL HIGH (ref 6–23)
Chloride: 91 mEq/L — ABNORMAL LOW (ref 96–112)
Glucose, Bld: 105 mg/dL — ABNORMAL HIGH (ref 70–99)
Potassium: 3.8 mEq/L (ref 3.5–5.1)

## 2012-01-22 LAB — PROTIME-INR
INR: 5.27 (ref 0.00–1.49)
Prothrombin Time: 44.9 seconds — ABNORMAL HIGH (ref 11.6–15.2)

## 2012-01-22 LAB — MRSA PCR SCREENING: MRSA by PCR: NEGATIVE

## 2012-01-22 MED ORDER — SODIUM CHLORIDE 0.9 % IJ SOLN
3.0000 mL | Freq: Two times a day (BID) | INTRAMUSCULAR | Status: DC
  Administered 2012-01-22 – 2012-01-28 (×9): 3 mL via INTRAVENOUS

## 2012-01-22 MED ORDER — RENA-VITE PO TABS
1.0000 | ORAL_TABLET | Freq: Every day | ORAL | Status: DC
  Administered 2012-01-22 – 2012-01-27 (×6): 1 via ORAL
  Filled 2012-01-22 (×8): qty 1

## 2012-01-22 MED ORDER — VANCOMYCIN HCL IN DEXTROSE 1-5 GM/200ML-% IV SOLN
1000.0000 mg | Freq: Once | INTRAVENOUS | Status: AC
  Administered 2012-01-22: 1000 mg via INTRAVENOUS
  Filled 2012-01-22: qty 200

## 2012-01-22 MED ORDER — SIMVASTATIN 40 MG PO TABS
40.0000 mg | ORAL_TABLET | Freq: Every day | ORAL | Status: DC
  Administered 2012-01-22 – 2012-01-27 (×6): 40 mg via ORAL
  Filled 2012-01-22 (×7): qty 1

## 2012-01-22 MED ORDER — SODIUM CHLORIDE 0.9 % IV SOLN
1.5000 g | Freq: Once | INTRAVENOUS | Status: AC
  Administered 2012-01-22: 1.5 g via INTRAVENOUS
  Filled 2012-01-22: qty 1.5

## 2012-01-22 MED ORDER — PARICALCITOL 5 MCG/ML IV SOLN
INTRAVENOUS | Status: AC
  Administered 2012-01-22: 4 ug via INTRAVENOUS
  Filled 2012-01-22: qty 1

## 2012-01-22 MED ORDER — INSULIN ASPART 100 UNIT/ML ~~LOC~~ SOLN
0.0000 [IU] | SUBCUTANEOUS | Status: DC
  Administered 2012-01-22 – 2012-01-23 (×3): 2 [IU] via SUBCUTANEOUS
  Administered 2012-01-23 (×2): 1 [IU] via SUBCUTANEOUS
  Administered 2012-01-24 (×2): 2 [IU] via SUBCUTANEOUS
  Administered 2012-01-24 – 2012-01-25 (×2): 1 [IU] via SUBCUTANEOUS

## 2012-01-22 MED ORDER — DEXTROSE 50 % IV SOLN
INTRAVENOUS | Status: AC
  Administered 2012-01-22: 09:00:00
  Filled 2012-01-22: qty 50

## 2012-01-22 MED ORDER — NA FERRIC GLUC CPLX IN SUCROSE 12.5 MG/ML IV SOLN
125.0000 mg | INTRAVENOUS | Status: DC
  Filled 2012-01-22: qty 10

## 2012-01-22 MED ORDER — VANCOMYCIN HCL 10 G IV SOLR
1250.0000 mg | Freq: Once | INTRAVENOUS | Status: AC
  Administered 2012-01-22: 1250 mg via INTRAVENOUS
  Filled 2012-01-22: qty 1250

## 2012-01-22 MED ORDER — ALLOPURINOL 150 MG HALF TABLET
150.0000 mg | ORAL_TABLET | Freq: Every day | ORAL | Status: DC
  Administered 2012-01-22 – 2012-01-28 (×7): 150 mg via ORAL
  Filled 2012-01-22 (×7): qty 1

## 2012-01-22 MED ORDER — ACETAMINOPHEN 650 MG RE SUPP
650.0000 mg | Freq: Four times a day (QID) | RECTAL | Status: DC | PRN
  Administered 2012-01-22: 650 mg via RECTAL
  Filled 2012-01-22: qty 1

## 2012-01-22 MED ORDER — DARBEPOETIN ALFA-POLYSORBATE 150 MCG/0.3ML IJ SOLN
INTRAMUSCULAR | Status: AC
  Administered 2012-01-22: 150 ug via INTRAVENOUS
  Filled 2012-01-22: qty 0.3

## 2012-01-22 MED ORDER — CALCIUM CARBONATE ANTACID 500 MG PO CHEW
1.0000 | CHEWABLE_TABLET | Freq: Three times a day (TID) | ORAL | Status: DC
  Administered 2012-01-22 – 2012-01-28 (×16): 200 mg via ORAL
  Filled 2012-01-22 (×24): qty 1

## 2012-01-22 MED ORDER — ACETAMINOPHEN 325 MG PO TABS
650.0000 mg | ORAL_TABLET | Freq: Four times a day (QID) | ORAL | Status: DC | PRN
  Administered 2012-01-22 – 2012-01-26 (×3): 650 mg via ORAL
  Filled 2012-01-22 (×4): qty 2

## 2012-01-22 MED ORDER — DARBEPOETIN ALFA-POLYSORBATE 150 MCG/0.3ML IJ SOLN
150.0000 ug | INTRAMUSCULAR | Status: DC
  Administered 2012-01-22: 150 ug via INTRAVENOUS

## 2012-01-22 MED ORDER — VANCOMYCIN HCL IN DEXTROSE 1-5 GM/200ML-% IV SOLN
1000.0000 mg | INTRAVENOUS | Status: DC
  Administered 2012-01-22 – 2012-01-26 (×3): 1000 mg via INTRAVENOUS
  Filled 2012-01-22 (×6): qty 200

## 2012-01-22 MED ORDER — METOPROLOL TARTRATE 12.5 MG HALF TABLET
37.5000 mg | ORAL_TABLET | Freq: Two times a day (BID) | ORAL | Status: DC
  Filled 2012-01-22: qty 1

## 2012-01-22 MED ORDER — DEXTROSE 50 % IV SOLN
1.0000 | Freq: Once | INTRAVENOUS | Status: AC
  Administered 2012-01-22: 50 mL via INTRAVENOUS
  Filled 2012-01-22: qty 50

## 2012-01-22 MED ORDER — DEXTROSE 50 % IV SOLN: 25.0000 mL | Freq: Once | INTRAVENOUS | Status: AC | PRN

## 2012-01-22 MED ORDER — PARICALCITOL 5 MCG/ML IV SOLN
4.0000 ug | INTRAVENOUS | Status: DC
  Administered 2012-01-22 – 2012-01-26 (×3): 4 ug via INTRAVENOUS
  Filled 2012-01-22 (×2): qty 0.8

## 2012-01-22 MED ORDER — METOPROLOL TARTRATE 25 MG PO TABS
37.5000 mg | ORAL_TABLET | Freq: Two times a day (BID) | ORAL | Status: DC
  Administered 2012-01-22 – 2012-01-26 (×10): 37.5 mg via ORAL
  Filled 2012-01-22 (×12): qty 1

## 2012-01-22 MED ORDER — SODIUM CHLORIDE 0.9 % IV SOLN
1.5000 g | Freq: Two times a day (BID) | INTRAVENOUS | Status: DC
  Administered 2012-01-22 – 2012-01-26 (×7): 1.5 g via INTRAVENOUS
  Administered 2012-01-26: 18:00:00 via INTRAVENOUS
  Administered 2012-01-27 – 2012-01-28 (×3): 1.5 g via INTRAVENOUS
  Filled 2012-01-22 (×18): qty 1.5

## 2012-01-22 NOTE — Progress Notes (Signed)
[facility] PROGRESS NOTE  Alan [name] ZOX:[id number] DOB: [birth year] DOA: 01/21/2012 PCP: Junious Silk, MD  Assessment/Plan: SIRS (systemic inflammatory response syndrome)  - infection of the right second toe wound with concern for gangrene.. Fever trending down and leukocytosis slight improvement. contininue IV vancomycin and Unasyn dosed per pharmacy day #2. -Patient denies any pain at this time however also is poor sensation of the distal extremities.  -wound Consult placed.  -Dr. Arbie Cookey has been consulted from the ED will evaluate for need of amputation of the right second great toe.  CAD (coronary artery disease)  Continue with metoprolol and statin.  Diabetes mellitus   Fair control. continue sliding scale insulin. Hold oral hypoglycemic. Patient is currently n.p.o. for surgical evaluation. He was noted to have some drop in his blood glucose is 64 and given D50 in the ED. Continue to monitor his blood glucose closely  End-stage renal disease on dialysis  Due for his dialysis on 1/14. Will confirm nephrology . Is a tues/thurs/sat dialysis schedule Atrial fibrillation  Patient is on Coumadin with supratherapeutic INR. Will hold his Coumadin for now as he likely will need surgery of his infected toe. Continue to monitor INR. No s/sx bleeding. If to have surgery consider reversing.   Diet: N.p.o. for now for possible surgery.    Code Status: full Family Communication: wife at bedside Disposition Plan: home when ready   Consultants:  Vascular  renal  Procedures:  none  Antibiotics:  Vancomycin 01/21/12  unasyn 01/21/11 >>>  HPI/Subjective: Awake alert oriented x3 denies pain.   Objective: Filed Vitals:   01/22/12 0300 01/22/12 0317 01/22/12 0643 01/22/12 0816  BP:  112/69 131/72 106/70  Pulse:  104 79 79  Temp:  102 F (38.9 C) 98.1 F (36.7 C) 98.4 F (36.9 C)  TempSrc:  Oral Oral Oral  Resp:  20 20 20   Height: 6' 0.05" (1.83 m) 6' (1.829  m)    Weight: 107 kg (235 lb 14.3 oz) 127 kg (279 lb 15.8 oz)    SpO2:  96% 95% 96%   No intake or output data in the 24 hours ending 01/22/12 0836 Filed Weights   01/22/12 0300 01/22/12 0317  Weight: 107 kg (235 lb 14.3 oz) 127 kg (279 lb 15.8 oz)    Exam:   General:  Well nourished NAD  Cardiovascular: RRR +murmur  Respiratory: normal effort BSCTAB no wheeze  Abdomen: obese soft +BS non-tender to palp  Extremities: remote left below the knee amputation. Right leg with swelling/erythema from mid shin to foot. Dressing to right foot dry and intact. Poor senstation  Data Reviewed: Basic Metabolic Panel:  Lab 01/22/12 8119 01/21/12 1809  NA 135 134*  K 3.8 4.6  CL 91* 89*  CO2 29 29  GLUCOSE 105* 80  BUN 55* 49*  CREATININE 9.53* 8.51*  CALCIUM 8.4 9.0  MG -- --  PHOS -- --   Liver Function Tests:  Lab 01/21/12 1809  AST 19  ALT 12  ALKPHOS 53  BILITOT 0.7  PROT 7.8  ALBUMIN 3.4*   No results found for this basename: LIPASE:5,AMYLASE:5 in the last 168 hours No results found for this basename: AMMONIA:5 in the last 168 hours CBC:  Lab 01/22/12 0640 01/21/12 1809  WBC 13.0* 14.1*  NEUTROABS 10.4* 12.0*  HGB 9.5* 11.3*  HCT 28.6* 33.6*  MCV 89.4 90.1  PLT 213 206   Cardiac Enzymes: No results found for this basename: CKTOTAL:5,CKMB:5,CKMBINDEX:5,TROPONINI:5 in the last 168 hours BNP (  last 3 results) No results found for this basename: PROBNP:3 in the last 8760 hours CBG:  Lab 01/22/12 0815 01/22/12 0402 01/22/12 0224 01/22/12 0142  GLUCAP 60* 188* 178* 64*    No results found for this or any previous visit (from the past 240 hour(s)).   Studies: No results found.  Scheduled Meds:   . allopurinol  150 mg Oral Daily  . ampicillin-sulbactam (UNASYN) IV  1.5 g Intravenous Q12H  . calcium carbonate  1 tablet Oral TID WC  . dextrose      . insulin aspart  0-9 Units Subcutaneous Q4H  . metoprolol tartrate  37.5 mg Oral BID  . simvastatin  40 mg  Oral QHS  . sodium chloride  3 mL Intravenous Q12H  . vancomycin  1,000 mg Intravenous Q T,Th,Sa-HD   Continuous Infusions:   Active Problems:  Aortic stenosis  CAD (coronary artery disease)  End stage renal disease on dialysis  Diabetes mellitus  Atherosclerosis of native arteries of the extremities with ulceration(440.23)  SIRS (systemic inflammatory response syndrome)  Wound infection  Unilateral complete BKA  Atrial fibrillation    Time spent: 30 minutes    [facility]  [facility]  If 8PM-8AM, please contact night-coverage at [email or web address], password [facility] 01/22/2012, 8:36 AM  LOS: 1 day

## 2012-01-22 NOTE — Progress Notes (Signed)
Hypoglycemic Event  CBG: 60  Treatment: D50 IV 50 mL  Symptoms: None  Follow-up CBG: Time:0900 CBG Result:119  Possible Reasons for Event: Inadequate meal intake  Comments/MD notified:Dr. Elmahi    Alan [name]  Remember to initiate Hypoglycemia Order Set & complete

## 2012-01-22 NOTE — Progress Notes (Signed)
Pharmacy - Coumadin protocol  INR today = 5.27 On Coumadin PTA for Afib  Plan: Hold Coumadin for pending surgery Continue to follow.  Thank you. Okey Regal, PharmD [phone]

## 2012-01-22 NOTE — Evaluation (Signed)
Physical Therapy Evaluation Patient Details Name: Alan [name] MRN: [id number] DOB: 1931/07/31 Today's Date: 01/22/2012 Time: 2841-3244 PT Time Calculation (min): 18 min  PT Assessment / Plan / Recommendation Clinical Impression  Pt adm with gangrene of rt second toe.  Pt with lt BKA about 6 months ago.  Pt has received prosthesis in the past couple of weeks but hasn't used it yet.  Now pt for rt 2nd toe amputation.  Asked pt to have wife bring his prosthesis in so he can begin using it. Depending on how pt does after surgery he may need some post acute rehab.    PT Assessment  Patient needs continued PT services    Follow Up Recommendations  Other (comment) (to be determined after surgery on rt foot.)    Does the patient have the potential to tolerate intense rehabilitation      Barriers to Discharge        Equipment Recommendations  None recommended by PT    Recommendations for Other Services     Frequency Min 3X/week    Precautions / Restrictions Precautions Precautions: Fall   Pertinent Vitals/Pain Pt reported soreness in rt foot and didn't want to try standing on it.      Mobility  Bed Mobility Bed Mobility: Supine to Sit;Sitting - Scoot to Edge of Bed;Sit to Supine Supine to Sit: 5: Supervision;HOB elevated;With rails Sitting - Scoot to Edge of Bed: 5: Supervision Sit to Supine: 5: Supervision;HOB elevated Details for Bed Mobility Assistance: incr time    Shoulder Instructions     Exercises Amputee Exercises Quad Sets: Strengthening;Both;10 reps;Supine Hip Flexion/Marching: Strengthening;Both;10 reps;Seated Knee Extension: Strengthening;Both;10 reps;Seated Straight Leg Raises: AAROM;Strengthening;Both;10 reps;Supine   PT Diagnosis: Generalized weakness;Acute pain  PT Problem List: Decreased strength;Decreased activity tolerance;Decreased mobility;Decreased knowledge of use of DME;Pain PT Treatment Interventions: DME instruction;Functional mobility  training;Therapeutic activities;Therapeutic exercise;Balance training;Patient/family education   PT Goals Acute Rehab PT Goals PT Goal Formulation: With patient Time For Goal Achievement: 01/29/12 Potential to Achieve Goals: Good Pt will go Sit to Stand: with min assist PT Goal: Sit to Stand - Progress: Goal set today Pt will go Stand to Sit: with min assist PT Goal: Stand to Sit - Progress: Goal set today Pt will Transfer Bed to Chair/Chair to Bed: with min assist PT Transfer Goal: Bed to Chair/Chair to Bed - Progress: Goal set today Pt will Stand: with min assist;1 - 2 min PT Goal: Stand - Progress: Goal set today  Visit Information  Last PT Received On: 01/22/12 Assistance Needed: +2 (for OOB/standing)    Subjective Data  Subjective: Pt asking if I am the one to  show him how to use his prosthesis. Patient Stated Goal: Get out of w/c   Prior Functioning  Home Living Lives With: Spouse Available Help at Discharge: Family;Available 24 hours/day (wife can't provide physical assist) Type of Home: House Home Access: Stairs to enter;Ramped entrance Entrance Stairs-Number of Steps: 2 Entrance Stairs-Rails: Right;Left Home Layout: One level Bathroom Shower/Tub: Health visitor: Standard Home Adaptive Equipment: Bedside commode/3-in-1;Wheelchair - manual;Walker - rolling;Straight cane Prior Function Level of Independence: Needs assistance Needs Assistance: Bathing;Dressing;Toileting;Transfers;Meal Prep Transfer Assistance: supervision to mod I Driving: No Vocation: Retired Musician: No difficulties    Cognition  Overall Cognitive Status: Appears within functional limits for tasks assessed/performed Arousal/Alertness: Awake/alert Orientation Level: Appears intact for tasks assessed Behavior During Session: [facility] for tasks performed    Extremity/Trunk Assessment Right Lower Extremity Assessment RLE ROM/Strength/Tone: Deficits RLE  ROM/Strength/Tone  Deficits: grossly 4-/5 Left Lower Extremity Assessment LLE ROM/Strength/Tone: Deficits LLE ROM/Strength/Tone Deficits: grossly 4-/5 for residual joints.   Balance Dynamic Sitting Balance Dynamic Sitting - Balance Support: Left upper extremity supported Dynamic Sitting - Level of Assistance: 6: Modified independent (Device/Increase time)  End of Session PT - End of Session Activity Tolerance: Patient limited by fatigue Patient left: in bed;with call bell/phone within reach Nurse Communication: Mobility status  GP     [facility] 01/22/2012, 1:22 PM  [facility] PT [phone]

## 2012-01-22 NOTE — Progress Notes (Addendum)
ANTIBIOTIC CONSULT NOTE - INITIAL  Pharmacy Consult for vancomycin, Unasyn, warfarin Indication: Possible wound infection and h/o atrial fibrillation  Allergies  Allergen Reactions  . Ancef (Cefazolin) Rash    Patient Measurements: Height: 6' 0.05" (183 cm) Weight: 235 lb 14.3 oz (107 kg) (Per 12/12/11 documentation) IBW/kg (Calculated) : 77.71   Vital Signs: Temp: 100.4 F (38 C) (01/13 1720) Temp src: Oral (01/13 1720) BP: 136/53 mmHg (01/13 2330) Pulse Rate: 101  (01/13 2330) Intake/Output from previous day:   Intake/Output from this shift:    Labs:  [facility] 01/21/12 1809  WBC 14.1*  HGB 11.3*  PLT 206  LABCREA --  CREATININE 8.51*   Estimated Creatinine Clearance: 9 ml/min (by C-G formula based on Cr of 8.51). No results found for this basename: VANCOTROUGH:2,VANCOPEAK:2,VANCORANDOM:2,GENTTROUGH:2,GENTPEAK:2,GENTRANDOM:2,TOBRATROUGH:2,TOBRAPEAK:2,TOBRARND:2,AMIKACINPEAK:2,AMIKACINTROU:2,AMIKACIN:2, in the last 72 hours   Microbiology: No results found for this or any previous visit (from the past 720 hour(s)).  Medical History: Past Medical History  Diagnosis Date  . Diabetes mellitus   . Gout   . Hypertension   . Hyperlipidemia   . Chronic kidney disease     Started dialysis October 2012  . Arthritis   . Prostate cancer     s/p seed implant  . Cataract     BILATERAL-BEEN REMOVED  . Neuromuscular disorder     DABETIC NEUROPATHY-LOWER EXTREMITY  . Atrial fibrillation     on coumadin  . Aortic stenosis     moderate to severe, not felt to be a surgical candidate  . Anemia     secondary to end stage renal disease  . Coronary artery disease     remote CABG in 1996; last stress test in 2005; last cath 2005  . Diabetic neuropathy   . Edema   . PAD (peripheral artery disease)   . Diabetic retinopathy(362.0)   . Chronic anticoagulation     on coumadin  . Nephrolithiasis     prior history of percutaneous nephrostomy  . Shortness of breath      Medications:  Scheduled:    . allopurinol  150 mg Oral Daily  . calcium carbonate  1 tablet Oral TID WC  . [COMPLETED] dextrose  1 ampule Intravenous Once  . insulin aspart  0-9 Units Subcutaneous Q4H  . metoprolol tartrate  37.5 mg Oral BID  . simvastatin  40 mg Oral QHS  . sodium chloride  3 mL Intravenous Q12H  . [COMPLETED] vancomycin  1,000 mg Intravenous Once  . [DISCONTINUED] metoprolol tartrate  37.5 mg Oral BID   Assessment: 79 yo male with ESRD-HD Tu-Thurs-Sat admitted with possible wound infection. Pharmacy to manage vancomycin and Unasyn.  Patient has already received vancomycin 1gm IV x 1. Patient is on Coumadin PTA for h/o atrial fibrillation. INR on admit is 4.18.    Goal of Therapy:  Pre-HD vancomycin level 15-25 mcg/mL INR 2-3  Plan:  1. Vancomycin 1.25gm IV x 1, then 1gm IV Q-HD (Tues-Thurs-Sat) 2. Unasyn 1.5gm IV Q12H. 3. Hold Coumadin due to INR above-goal and plan for possible surgery. 4. Daily PT / INR 5. Coumadin education with pharmacist.   Emeline Gins 01/22/2012,3:02 AM

## 2012-01-22 NOTE — ED Provider Notes (Addendum)
History     CSN: [id number]  Arrival date & time 01/21/12  1606   First MD Initiated Contact with Patient 01/21/12 2320      Chief Complaint  Patient presents with  . Wound Infection    (Consider location/radiation/quality/duration/timing/severity/associated sxs/prior treatment) Patient is a 79 y.o. male presenting with lower extremity pain. The history is provided by the patient.  Foot Pain This is a chronic problem. The current episode started more than 1 week ago. The problem occurs constantly. The problem has not changed (Pt with hx of foot ulcer,  Wound clinic advised to come to ed) since onset.   Past Medical History  Diagnosis Date  . Diabetes mellitus   . Gout   . Hypertension   . Hyperlipidemia   . Chronic kidney disease     Started dialysis October 2012  . Arthritis   . Prostate cancer     s/p seed implant  . Cataract     BILATERAL-BEEN REMOVED  . Neuromuscular disorder     DABETIC NEUROPATHY-LOWER EXTREMITY  . Atrial fibrillation     on coumadin  . Aortic stenosis     moderate to severe, not felt to be a surgical candidate  . Anemia     secondary to end stage renal disease  . Coronary artery disease     remote CABG in 1996; last stress test in 2005; last cath 2005  . Diabetic neuropathy   . Edema   . PAD (peripheral artery disease)   . Diabetic retinopathy(362.0)   . Chronic anticoagulation     on coumadin  . Nephrolithiasis     prior history of percutaneous nephrostomy  . Shortness of breath     Past Surgical History  Procedure Date  . Ventral hernia repair   . Inguinal hernia repair     right side  . Colonoscopy   . Kidney stone surgery   . Polypectomy   . Tonsillectomy   . Toe surgery     removal little toe right foot  . Cardiac catheterization 04/19/2003    SEVERE 2 VESSEL OBSTRUCTIVE ATHERSCLEROTIC CAD  . Coronary artery bypass graft 1996    LIMA GRAFT TO THE LAD, SEQUENTIAL SAPHENOUS  VEIN GRAFT TO THE FIRST AND SECOND DIAGONIAL  BRANCHES, SEQUENTIAL SAPHENOUS VEIN GRAFT TO THE ACUTE MARGINAL, POSTERIOR DESCENDING, AND POSTERIOR LATERAL BRANCHES OF THE RIGHT CORONARY ARTERY  . Radioactive seed implant   . US echocardiography 09/06/2009    EF 55-60%  . Cardiovascular stress test 04/13/2003    EF 54%. EVIDENCE OF ANTERO-APICAL ISCHEMIA. NORMAL LV SIZE AND FUNCTION  . Tee without cardioversion 05/09/2011    Procedure: TRANSESOPHAGEAL ECHOCARDIOGRAM (TEE);  Surgeon: Laurey Morale, MD;  Location: [facility] ENDOSCOPY;  Service: Cardiovascular;  Laterality: N/A;  . Hernia repair   . Amputation 07/20/2011    Procedure: AMPUTATION BELOW KNEE;  Surgeon: Nada Libman, MD;  Location: [facility] OR;  Service: Vascular;  Laterality: Left;    Family History  Problem Relation Age of Onset  . Heart disease Father   . Hypertension Brother   . Stroke Brother   . Diabetes Mother     Pt. not sure, but he thinks she did.    History  Substance Use Topics  . Smoking status: Former Smoker -- 2 years    Types: Cigarettes    Quit date: 04/10/1968  . Smokeless tobacco: Former Neurosurgeon    Types: Chew  . Alcohol Use: No      Review of  Systems  Constitutional: Positive for fever.  All other systems reviewed and are negative.    Allergies  Ancef  Home Medications   Current Outpatient Rx  Name  Route  Sig  Dispense  Refill  . ALLOPURINOL 300 MG PO TABS   Oral   Take 150 mg by mouth daily.         . ASPIRIN EC 81 MG PO TBEC   Oral   Take 81 mg by mouth daily.         [name] CALCIUM CARBONATE ANTACID 500 MG PO CHEW   Oral   Chew 1 tablet by mouth 3 (three) times daily with meals. Phosphorous binder         . CORICIDIN D PO   Oral   Take 1 tablet by mouth at bedtime as needed. For cold symptoms         . ESTAZOLAM 2 MG PO TABS   Oral   Take 1 mg by mouth at bedtime as needed. For sleep         . GLIMEPIRIDE 4 MG PO TABS   Oral   Take 2 mg by mouth daily.          [name] METOPROLOL TARTRATE 25 MG PO TABS   Oral   Take 37.5  mg by mouth 2 (two) times daily.         [name] SIMVASTATIN 40 MG PO TABS   Oral   Take 40 mg by mouth at bedtime.          . WARFARIN SODIUM 5 MG PO TABS   Oral   Take 2.5-5 mg by mouth daily. Takes 5 mg every day of the week except on Fridays, he takes 2.5 mg           BP 136/53  Pulse 101  Temp 100.4 F (38 C) (Oral)  Resp 18  SpO2 99%  Physical Exam  Constitutional: He is oriented to person, place, and time. He appears well-developed and well-nourished.  HENT:  Head: Normocephalic and atraumatic.  Eyes: Conjunctivae normal are normal. Pupils are equal, round, and reactive to light.  Neck: Normal range of motion. Neck supple.  Cardiovascular: Normal rate, regular rhythm, normal heart sounds and intact distal pulses.   Pulmonary/Chest: Effort normal and breath sounds normal.  Abdominal: Soft. Bowel sounds are normal.  Musculoskeletal:       Left aka.  Rt 2nd toe s/p debridement with surgical dressing in place to distal tip.    Neurological: He is alert and oriented to person, place, and time.  Skin: Skin is warm and dry.  Psychiatric: He has a normal mood and affect. His behavior is normal. Judgment and thought content normal.    ED Course  Procedures (including critical care time)  Labs Reviewed  CBC WITH DIFFERENTIAL - Abnormal; Notable for the following:    WBC 14.1 (*)     RBC 3.73 (*)     Hemoglobin 11.3 (*)     HCT 33.6 (*)     Neutrophils Relative 85 (*)     Neutro Abs 12.0 (*)     Lymphocytes Relative 6 (*)     Monocytes Absolute 1.2 (*)     All other components within normal limits  COMPREHENSIVE METABOLIC PANEL - Abnormal; Notable for the following:    Sodium 134 (*)     Chloride 89 (*)     BUN 49 (*)     Creatinine, Ser 8.51 (*)  Albumin 3.4 (*)     GFR calc non Af Amer 5 (*)     GFR calc Af Amer 6 (*)     All other components within normal limits  PROTIME-INR - Abnormal; Notable for the following:    Prothrombin Time 37.8 (*)     INR 4.18  (*)     All other components within normal limits   No results found.   No diagnosis found.    MDM  + gangrenous toe,  Fever.  Discussed with vascular, Early, and hospitalist.  Will admit         Rosanne Ashing, MD 01/22/12 0014  Zula Hovsepian Lytle Michaels, MD 01/22/12 [id number]

## 2012-01-22 NOTE — Progress Notes (Signed)
[facility]  NAME:  [name], [name]            ACCOUNT NO.:  [id number]  MEDICAL RECORD NO.:  [id number]      DATE OF BIRTH:  February 22, 1931  PHYSICIAN:  Ardath Sax, M.D.           VISIT DATE:                                  OFFICE VISIT   Alan [name] who was been a longtime patient here.  He is a diabetic.  He has got end-stage renal disease, on dialysis 3 times a week.  He enters here with a gangrenous right second toe.  He is already had a left BKA amputation.  Today, he has a temperature almost 101 with obvious gangrenous toe.  I debrided it and saw that it was totally gangrenous. He also had a great deal of swelling around the dorsum of the foot, so we elected because he was a diabetic and on dialysis and had a history of peripheral vascular disease.  I sent him to the emergency room to have a surgical consult and various medical consults to take care of his rather sick man.     Ardath Sax, M.D.     PP/MEDQ  D:  01/21/2012  T:  01/22/2012  Job:  [id number]

## 2012-01-22 NOTE — Progress Notes (Signed)
Addendum  Patient seen and examined, chart and data base reviewed.  I agree with the above assessment and plan.  For full details please see Mrs. Alan Smothers NP note.  SIRS, diabetes mellitus and chronic kidney disease.  On broad-spectrum IV antibiotics, cultures pending.  Vascular surgery plan is noted for second toe amputation versus more proximal, BKA versus AKA.   Clint Lipps, MD [facility] Pager: [phone] 01/22/2012, 2:35 PM

## 2012-01-22 NOTE — Consult Note (Signed)
[facility] Consult  Reason for Consult:  Gangrenous toe right foot Referring Physician:  ED  History of Present Illness: This is a 79 y.o. male admitted with gangrenous right 2nd toe.  He was having his wound debrided at the wound care center yesterday where he was found to have a great deal of swelling of the foot and the 2nd right toe was gangrenous.  The pt had a fever of close to 101 at the wound care center.  Due to these circumstances and his hx of ESRD, CAD s/p CABG '96, HTN, Afib and on coumadin, gout and DM neuropathy.  He has chronic PVD with hx of left BKA ~ a year ago.  IN the ED, his temp was 100.4 and WBC of 14.1.  He was placed on IV ABx and his WBC today is down to 13 and a Tm 102 now 98.4.  Pt's INR today is 5.2.   Past Medical History  Diagnosis Date  . Diabetes mellitus   . Gout   . Hypertension   . Hyperlipidemia   . Chronic kidney disease     Started dialysis October 2012  . Arthritis   . Prostate cancer     s/p seed implant  . Cataract     BILATERAL-BEEN REMOVED  . Neuromuscular disorder     DABETIC NEUROPATHY-LOWER EXTREMITY  . Atrial fibrillation     on coumadin  . Aortic stenosis     moderate to severe, not felt to be a surgical candidate  . Anemia     secondary to end stage renal disease  . Coronary artery disease     remote CABG in 1996; last stress test in 2005; last cath 2005  . Diabetic neuropathy   . Edema   . PAD (peripheral artery disease)   . Diabetic retinopathy(362.0)   . Chronic anticoagulation     on coumadin  . Nephrolithiasis     prior history of percutaneous nephrostomy  . Shortness of breath    Past Surgical History  Procedure Date  . Ventral hernia repair   . Inguinal hernia repair     right side  . Colonoscopy   . Kidney stone surgery   . Polypectomy   . Tonsillectomy   . Toe surgery     removal little toe right foot  . Cardiac catheterization 04/19/2003    SEVERE 2 VESSEL OBSTRUCTIVE ATHERSCLEROTIC  CAD  . Coronary artery bypass graft 1996    LIMA GRAFT TO THE LAD, SEQUENTIAL SAPHENOUS  VEIN GRAFT TO THE FIRST AND SECOND DIAGONIAL BRANCHES, SEQUENTIAL SAPHENOUS VEIN GRAFT TO THE ACUTE MARGINAL, POSTERIOR DESCENDING, AND POSTERIOR LATERAL BRANCHES OF THE RIGHT CORONARY ARTERY  . Radioactive seed implant   . US echocardiography 09/06/2009    EF 55-60%  . Cardiovascular stress test 04/13/2003    EF 54%. EVIDENCE OF ANTERO-APICAL ISCHEMIA. NORMAL LV SIZE AND FUNCTION  . Tee without cardioversion 05/09/2011    Procedure: TRANSESOPHAGEAL ECHOCARDIOGRAM (TEE);  Surgeon: Laurey Morale, MD;  Location[facility] ENDOSCOPY;  Service: Cardiovascular;  Laterality: N/A;  . Hernia repair   . Amputation 07/20/2011    Procedure: AMPUTATION BELOW KNEE;  Surgeon: Nada Libman, MD;  Location: [facility] OR;  Service: Vascular;  Laterality: Left;    Allergies  Allergen Reactions  . Ancef (Cefazolin) Rash    Prior to Admission medications   Medication Sig Start Date End Date Taking? Authorizing Provider  allopurinol (ZYLOPRIM) 300 MG tablet Take 150 mg by mouth daily.  Yes Historical Provider, MD  aspirin EC 81 MG tablet Take 81 mg by mouth daily.   Yes Historical Provider, MD  calcium carbonate (TUMS - DOSED IN MG ELEMENTAL CALCIUM) 500 MG chewable tablet Chew 1 tablet by mouth 3 (three) times daily with meals. Phosphorous binder   Yes Historical Provider, MD  Chlorphen-Pseudoephed-APAP (CORICIDIN D PO) Take 1 tablet by mouth at bedtime as needed. For cold symptoms   Yes Historical Provider, MD  estazolam (PROSOM) 2 MG tablet Take 1 mg by mouth at bedtime as needed. For sleep   Yes Historical Provider, MD  glimepiride (AMARYL) 4 MG tablet Take 2 mg by mouth daily.    Yes Historical Provider, MD  metoprolol tartrate (LOPRESSOR) 25 MG tablet Take 37.5 mg by mouth 2 (two) times daily.   Yes Historical Provider, MD  simvastatin (ZOCOR) 40 MG tablet Take 40 mg by mouth at bedtime.  11/16/11  Yes Historical Provider, MD    warfarin (COUMADIN) 5 MG tablet Take 2.5-5 mg by mouth daily. Takes 5 mg every day of the week except on Fridays, he takes 2.5 mg   Yes Historical Provider, MD    History   Social History  . Marital Status: Married    Spouse Name: N/A    Number of Children: N/A  . Years of Education: N/A   Occupational History  . Not on file.   Social History Main Topics  . Smoking status: Former Smoker -- 2 years    Types: Cigarettes    Quit date: 04/10/1968  . Smokeless tobacco: Former Neurosurgeon    Types: Chew  . Alcohol Use: No  . Drug Use: No  . Sexually Active: No   Other Topics Concern  . Not on file   Social History Narrative  . No narrative on file    Family History  Problem Relation Age of Onset  . Heart disease Father   . Hypertension Brother   . Stroke Brother   . Diabetes Mother     Pt. not sure, but he thinks she did.    ROS: [x]  Positive   [ ]  Negative   [ ]  All sytems reviewed and are negative   General: [ ]  Weight loss, [ ]  Weight gain, [ ]   Loss of appetite, [ ]  Fever Neurologic: [ ]  Dizziness, [ ]  Blackouts, [ ]  Headaches, [ ]  Seizure Ear/Nose/Throat: [ ]  Change in eyesight, [ ]  Change in hearing, [ ]  Nose bleeds, [ ]  Sore throat Vascular: [ ]  Pain in legs with walking, [ ]  Pain in feet while lying flat, [x ] Non-healing ulcer,[ ]  Stroke, [ ]  "Mini stroke", [ ]  Slurred speech, [ ]  Temporary blindness, [ ]  Blood clot in vein, [ ]  Phlebitis Pulmonary: [ ]  Home oxygen, [ ]  Productive cough, [ ]  Bronchitis, [ ]  Coughing up blood,  [ ]  Asthma, [ ]  Wheezing Musculoskeletal: [ ]  Arthritis, [ ]  Joint pain, [ ]  Muscle pain Cardiac: [ ]  Chest pain, [ ]  Chest tightness/pressure, [ ]  Shortness of breath when lying flat, [ ]  Shortness of breath with exertion, [ ]  Palpitations, [ ]  Heart murmur, [ ]  Arrythmia,  [ ]  Atrial fibrillation Hematologic: [ x] Bleeding problems-on coumadin, [ ]  Clotting disorder, [ ]  Anemia Psychiatric:  [ ]  Depression, [ ]  Anxiety, [ ]  Attention deficit  disorder Gastrointestinal:  [ ]  Black stool,[ ]   Blood in stool, [ ]  Peptic ulcer disease, [ ]  Reflux, [ ]  Hiatal hernia, [ ]  Trouble swallowing, [ ]   Diarrhea, [ ]  Constipation Urinary:  [ ]  Kidney disease, [ ]  Burning with urination, [ ]  Frequent urination, [ ]  Difficulty urinating Skin: [ ]  Ulcers, [ ]  Rashes   Physical Examination  Filed Vitals:   01/22/12 0816  BP: 106/70  Pulse: 79  Temp: 98.4 F (36.9 C)  Resp: 20   Body mass index is 37.97 kg/(m^2).  General:  WDWN in NAD Gait: Not observed HENT: WNL Eyes: Pupils equal Pulmonary: normal non-labored breathing , without Rales, rhonchi,  wheezing Cardiac:irregular Abdomen: soft, NT, no masses Skin: no rashes, ulcers noted Vascular Exam/Pulses:2nd great toe with gangrenous changes; no bone exposure, but there is a pin hole sized opening that possibly tracts to the bone. Extremities: with ischemic changes to 2nd right toe with Gangrene , no cellulitis; + edema RLE;  Left BKA with wrap Musculoskeletal: no muscle wasting or atrophy  Neurologic: A&O X 3; Appropriate Affect ; SENSATION: normal; MOTOR FUNCTION:  moving all extremities equally. Speech is fluent/normal  Non-Invasive Vascular Imaging:  ASSESSMENT/PLAN: This is a 79 y.o. male with gangrenous 2nd right toe  -the 2nd right toe may or may not be source of fever -continue IV ABx -hold coumadin as INR is 5.2.  INR will need to be less than 1.6 for the OR -will schedule pt for right 2nd toe amputation as this toe is not going to heal and this will either be Thursday or Friday depending on INR. -discontinue NPO today -HD per renal service   Doreatha Massed, PA-C [facility] [phone]  I have seen and evaluated the above patient. He is well known to me having undergone left leg amputation. He also has had chronic ulcers on his right foot. I have attempted several times to revascularize his right leg percutaneously, these have been unsuccessful. He  is not a candidate for surgical revascularization. He presented with concerns over gangrene to his right second toe. He has had fevers overnight. Currently he does not appear septic. He is on Coumadin for atrial fibrillation. His INR is 5.2 today.  On examination the right second toe has had the nailbed removed. There is a small opening which appears to track down to the bone. I could not express any purulence.  The patient will require second toe amputation on the right. I would not do this until his Coumadin is down to 1.5 or less. I do not feel that he is septic currently from his toe. I will be out of town so I will have one of my partners assumed his care tomorrow and schedule his amputation accordingly. I did discuss with the patient back if he is not able to heal the incision site do to poor circulation, he would require a more proximal amputation which could ultimately lead to a below versus above-knee amputation.  Durene Cal

## 2012-01-22 NOTE — Progress Notes (Signed)
CRITICAL VALUE ALERT  Critical value received:  INR 5.27  Date of notification:  01/22/12  Time of notification:  0748  Critical value read back:yes  Nurse who received alert:  Kacper Cartlidge, RN  MD notified (1st page):  Dr. Arthor Captain  Time of first page:  Dr. On floor and notified immediately  MD notified (2nd page):  Time of second page:  Responding MD:    Time MD responded:

## 2012-01-22 NOTE — Progress Notes (Signed)
Pt temp is 102.5- giving pt PRN tylenol. Will contact doctor if pt does not improve. Will continue to monitor

## 2012-01-22 NOTE — H&P (Signed)
[facility] History and Physical  Alan [name] JYN:[id number] DOB: [birth year] DOA: 01/21/2012  Referring physician: ED PCP: Junious Silk, MD   Chief Complaint: sent from wound clinic for infected right second toe wound  HPI:  79 year old male with history off end-stage renal disease on dialysis (2 sisters to Saturdays), hypertension, hyperlipidemia, gout, diabetic neuropathy, atrial fibrillation on Coumadin, CAD s/pCABG,  in 1996, peripheral artery disease post left BKA  1 year back and chronic wound over right toe sent from wound clinic for fever and clinically appearing gangrenous right second toe. Patient informs  Having some fever at home but did not record his temperature. He also has been having some pain over his right foot for past one week. He denies any headache, blurry vision, dizziness, chest pain, palpitations, shortness of breath, abdominal pain, nausea, vomiting, diarrhea. Seen in the wound clinic and given fever and concern for gangrene of the toe was sent to the ED. In the ED he was noted to be febrile to 100.4. Had significant leukocytosis. [facility] called for admission to medical floor on telemetry. Ask her surgeon Dr. early consulted.  Review of Systems:  Constitutional: Denies fever, chills, diaphoresis, appetite change and fatigue.  has subjective fevers. HEENT: Denies photophobia, eye pain, redness, hearing loss, ear pain, congestion, sore throat, rhinorrhea, sneezing, mouth sores, trouble swallowing, neck pain, neck stiffness and tinnitus.   Respiratory: Denies SOB, DOE, cough, chest tightness,  and wheezing.   Cardiovascular: Denies chest pain, palpitations and leg swelling.  Gastrointestinal: Denies nausea, vomiting, abdominal pain, diarrhea, constipation, blood in stool and abdominal distention.  Genitourinary: Denies dysuria, urgency, frequency, hematuria, flank pain and difficulty urinating.  Musculoskeletal: Pain over right foot. Denies  myalgias, back pain, joint swelling, arthralgias . Nonambulatory Skin: Denies pallor, rash and wound.  Neurological: Denies dizziness, seizures, syncope, weakness, light-headedness, numbness and headaches.  Hematological: Denies adenopathy. Easy bruising, personal or family bleeding history  Psychiatric/Behavioral: Denies suicidal ideation, mood changes, confusion, nervousness, sleep disturbance and agitation   Past Medical History  Diagnosis Date  . Diabetes mellitus   . Gout   . Hypertension   . Hyperlipidemia   . Chronic kidney disease     Started dialysis October 2012  . Arthritis   . Prostate cancer     s/p seed implant  . Cataract     BILATERAL-BEEN REMOVED  . Neuromuscular disorder     DABETIC NEUROPATHY-LOWER EXTREMITY  . Atrial fibrillation     on coumadin  . Aortic stenosis     moderate to severe, not felt to be a surgical candidate  . Anemia     secondary to end stage renal disease  . Coronary artery disease     remote CABG in 1996; last stress test in 2005; last cath 2005  . Diabetic neuropathy   . Edema   . PAD (peripheral artery disease)   . Diabetic retinopathy(362.0)   . Chronic anticoagulation     on coumadin  . Nephrolithiasis     prior history of percutaneous nephrostomy  . Shortness of breath    Past Surgical History  Procedure Date  . Ventral hernia repair   . Inguinal hernia repair     right side  . Colonoscopy   . Kidney stone surgery   . Polypectomy   . Tonsillectomy   . Toe surgery     removal little toe right foot  . Cardiac catheterization 04/19/2003    SEVERE 2 VESSEL OBSTRUCTIVE ATHERSCLEROTIC CAD  . Coronary artery  bypass graft 1996    LIMA GRAFT TO THE LAD, SEQUENTIAL SAPHENOUS  VEIN GRAFT TO THE FIRST AND SECOND DIAGONIAL BRANCHES, SEQUENTIAL SAPHENOUS VEIN GRAFT TO THE ACUTE MARGINAL, POSTERIOR DESCENDING, AND POSTERIOR LATERAL BRANCHES OF THE RIGHT CORONARY ARTERY  . Radioactive seed implant   . US echocardiography 09/06/2009     EF 55-60%  . Cardiovascular stress test 04/13/2003    EF 54%. EVIDENCE OF ANTERO-APICAL ISCHEMIA. NORMAL LV SIZE AND FUNCTION  . Tee without cardioversion 05/09/2011    Procedure: TRANSESOPHAGEAL ECHOCARDIOGRAM (TEE);  Surgeon: Laurey Morale, MD;  Location: [facility] ENDOSCOPY;  Service: Cardiovascular;  Laterality: N/A;  . Hernia repair   . Amputation 07/20/2011    Procedure: AMPUTATION BELOW KNEE;  Surgeon: Nada Libman, MD;  Location: [facility] OR;  Service: Vascular;  Laterality: Left;   Social History:  reports that he quit smoking about 43 years ago. His smoking use included Cigarettes. He quit after 2 years of use. He has quit using smokeless tobacco. His smokeless tobacco use included Chew. He reports that he does not drink alcohol or use illicit drugs.  Allergies  Allergen Reactions  . Ancef (Cefazolin) Rash    Family History  Problem Relation Age of Onset  . Heart disease Father   . Hypertension Brother   . Stroke Brother   . Diabetes Mother     Pt. not sure, but he thinks she did.    Prior to Admission medications   Medication Sig Start Date End Date Taking? Authorizing Provider  allopurinol (ZYLOPRIM) 300 MG tablet Take 150 mg by mouth daily.   Yes Historical Provider, MD  aspirin EC 81 MG tablet Take 81 mg by mouth daily.   Yes Historical Provider, MD  calcium carbonate (TUMS - DOSED IN MG ELEMENTAL CALCIUM) 500 MG chewable tablet Chew 1 tablet by mouth 3 (three) times daily with meals. Phosphorous binder   Yes Historical Provider, MD  Chlorphen-Pseudoephed-APAP (CORICIDIN D PO) Take 1 tablet by mouth at bedtime as needed. For cold symptoms   Yes Historical Provider, MD  estazolam (PROSOM) 2 MG tablet Take 1 mg by mouth at bedtime as needed. For sleep   Yes Historical Provider, MD  glimepiride (AMARYL) 4 MG tablet Take 2 mg by mouth daily.    Yes Historical Provider, MD  metoprolol tartrate (LOPRESSOR) 25 MG tablet Take 37.5 mg by mouth 2 (two) times daily.   Yes Historical Provider,  MD  simvastatin (ZOCOR) 40 MG tablet Take 40 mg by mouth at bedtime.  11/16/11  Yes Historical Provider, MD  warfarin (COUMADIN) 5 MG tablet Take 2.5-5 mg by mouth daily. Takes 5 mg every day of the week except on Fridays, he takes 2.5 mg   Yes Historical Provider, MD    Physical Exam:  Filed Vitals:   01/21/12 1720 01/21/12 2330  BP: 116/74 136/53  Pulse: 82 101  Temp: 100.4 F (38 C)   TempSrc: Oral   Resp: 20 18  SpO2: 100% 99%    Constitutional: Vital signs reviewed.  Patient is a well-developed and well-nourished in no acute distress and cooperative with exam. Alert and oriented x3.  Head: Normocephalic and atraumatic Ear: TM normal bilaterally Mouth: no erythema or exudates, MMM Eyes: PERRL, EOMI, conjunctivae normal, No scleral icterus.  Neck: Supple, Trachea midline normal ROM, No JVD, mass, thyromegaly, or carotid bruit present.  Cardiovascular: RRR, normal S1 and S2. Systolic murmur. Feeble right distal pulses. Pulmonary/Chest: CTAB, no wheezes, rales, or rhonchi Abdominal: Soft. Non-tender, non-distended,  bowel sounds are normal, no masses, organomegaly, or guarding present.  GU: no CVA tenderness Musculoskeletal: Left below-knee amputation.  Ext: Swelling over right leg extending from distal tibia to the foot with some pitting. Amputated right fifth toe. Blood strain discharged from right second toe. Feeble distal pulses. Poor sensation. Has a 1x1 cm ulcer over the lateral malleolus. No Drainage noted.  Right-sided AV graft Hematology: no cervical, inginal, or axillary adenopathy.  Neurological: A&O x3, Strenght is normal and symmetric bilaterally, cranial nerve II-XII are grossly intact, no focal motor deficit, sensory intact to light touch bilaterally.  Skin: Warm, dry and intact. No rash, cyanosis, or clubbing.  Psychiatric: Normal mood and affect. speech and behavior is normal. Judgment and thought content normal. Cognition and memory are normal.   Labs on Admission:   Basic Metabolic Panel:  Lab 01/21/12 1610  NA 134*  K 4.6  CL 89*  CO2 29  GLUCOSE 80  BUN 49*  CREATININE 8.51*  CALCIUM 9.0  MG --  PHOS --   Liver Function Tests:  Lab 01/21/12 1809  AST 19  ALT 12  ALKPHOS 53  BILITOT 0.7  PROT 7.8  ALBUMIN 3.4*   No results found for this basename: LIPASE:5,AMYLASE:5 in the last 168 hours No results found for this basename: AMMONIA:5 in the last 168 hours CBC:  Lab 01/21/12 1809  WBC 14.1*  NEUTROABS 12.0*  HGB 11.3*  HCT 33.6*  MCV 90.1  PLT 206   Cardiac Enzymes: No results found for this basename: CKTOTAL:5,CKMB:5,CKMBINDEX:5,TROPONINI:5 in the last 168 hours BNP: No components found with this basename: POCBNP:5 CBG:  Lab 01/22/12 0142  GLUCAP 64*    Radiological Exams on Admission: No results found.  EKG: Pending  Assessment/Plan Active Problems:  SIRS (systemic inflammatory response syndrome) - presents with infection of the right second toe wound with concern for gangrene.. Has significant fever and leukocytosis. Will admit to telemetry with broad antibiotic coverage with IV vancomycin and Unasyn  dosed per pharmacy.. -Patient denies any pain at this time however also is poor sensation of the distal extremities. -wound Consult placed. -Vascular surgery has been consulted from the ED will evaluate for need of amputation of the right second great toe.   CAD (coronary artery disease) Continue with metoprolol and statin.   Diabetes mellitus Place  him on sliding scale insulin. Hold oral hypoglycemic. Patient is currently n.p.o. for surgical evaluation. He was noted to have some drop in his blood glucose is 64 and given D50 in the ED. Continue to monitor his blood glucose closely  End-stage renal disease on dialysis Due for his dialysis on 1/14. I will notify his nephrologist in the morning.   Atrial fibrillation Patient is on Coumadin with supratherapeutic INR. Will hold his Coumadin for now as he likely  will need surgery of his infected toe. Monitor INR. Continue telemetry monitoring  Diet: N.p.o. for now for possible surgery. DVT prophylaxis: On  Coumadin with supratherapeutic INR  Code Status: Full code Family Communication: Wife at bedside Disposition Plan: Currently inpatient  Eddie North [facility] Pager [phone]  If 7PM-7AM, please contact night-coverage [email or web address] Password [email or web address] 01/22/2012, 2:26 AM  Total time spent: 70 minutes

## 2012-01-22 NOTE — Consult Note (Signed)
WOC consult ordered, however per bedside nursing vascular just evaluated pt and will plan for amputation once INR stable for surgery.  Will not consult for this reason.  Re consult if needed, will not follow at this time. Thanks  Fredonia Casalino [employer], CWOCN [phone])

## 2012-01-22 NOTE — Consult Note (Signed)
[facility] Renal Consultation Note  Indication for Consultation:  Management of ESRD/hemodialysis; anemia, hypertension/volume and secondary hyperparathyroidism  HPI: Alan [name] is a 79 y.o. male admitted yesterday from his wound center with fever to 110.4 , pain in his right foot, and gangrenous appearing right second toe.  He reported fevers at home but not recorded. He has a history of atrial fibrillation on Coumadin, INR today up to  5.27, CAD s/pCABG, in 1996, peripheral artery disease post left BKA 1 year back and chronic wound over right toe  Followed by the wound clinic. Now in room   Pain controlled with meds and reports no  Problems with his outpt. HD.          Past Medical History  Diagnosis Date  . Diabetes mellitus   . Gout   . Hypertension   . Hyperlipidemia   . Chronic kidney disease     Started dialysis October 2012  . Arthritis   . Prostate cancer     s/p seed implant  . Cataract     BILATERAL-BEEN REMOVED  . Neuromuscular disorder     DABETIC NEUROPATHY-LOWER EXTREMITY  . Atrial fibrillation     on coumadin  . Aortic stenosis     moderate to severe, not felt to be a surgical candidate  . Anemia     secondary to end stage renal disease  . Coronary artery disease     remote CABG in 1996; last stress test in 2005; last cath 2005  . Diabetic neuropathy   . Edema   . PAD (peripheral artery disease)   . Diabetic retinopathy(362.0)   . Chronic anticoagulation     on coumadin  . Nephrolithiasis     prior history of percutaneous nephrostomy  . Shortness of breath     Past Surgical History  Procedure Date  . Ventral hernia repair   . Inguinal hernia repair     right side  . Colonoscopy   . Kidney stone surgery   . Polypectomy   . Tonsillectomy   . Toe surgery     removal little toe right foot  . Cardiac catheterization 04/19/2003    SEVERE 2 VESSEL OBSTRUCTIVE ATHERSCLEROTIC CAD  . Coronary artery bypass graft 1996    LIMA  GRAFT TO THE LAD, SEQUENTIAL SAPHENOUS  VEIN GRAFT TO THE FIRST AND SECOND DIAGONIAL BRANCHES, SEQUENTIAL SAPHENOUS VEIN GRAFT TO THE ACUTE MARGINAL, POSTERIOR DESCENDING, AND POSTERIOR LATERAL BRANCHES OF THE RIGHT CORONARY ARTERY  . Radioactive seed implant   . US echocardiography 09/06/2009    EF 55-60%  . Cardiovascular stress test 04/13/2003    EF 54%. EVIDENCE OF ANTERO-APICAL ISCHEMIA. NORMAL LV SIZE AND FUNCTION  . Tee without cardioversion 05/09/2011    Procedure: TRANSESOPHAGEAL ECHOCARDIOGRAM (TEE);  Surgeon: Laurey Morale, MD;  Location: [facility] ENDOSCOPY;  Service: Cardiovascular;  Laterality: N/A;  . Hernia repair   . Amputation 07/20/2011    Procedure: AMPUTATION BELOW KNEE;  Surgeon: Nada Libman, MD;  Location: [facility] OR;  Service: Vascular;  Laterality: Left;      Family History  Problem Relation Age of Onset  . Heart disease Father   . Hypertension Brother   . Stroke Brother   . Diabetes Mother     Pt. not sure, but he thinks she did.   Social = Lives with Wife at home,has grown Daughter and son and 2 Sisters on HD. HE also    reports that he quit smoking about 43 years  ago. His smoking use included Cigarettes. He quit after 2 years of use. He has quit using smokeless tobacco. His smokeless tobacco use included Chew. He reports that he does not drink alcohol or use illicit drugs.   Allergies  Allergen Reactions  . Ancef (Cefazolin) Rash    Prior to Admission medications   Medication Sig Start Date End Date Taking? Authorizing Provider  allopurinol (ZYLOPRIM) 300 MG tablet Take 150 mg by mouth daily.   Yes Historical Provider, MD  aspirin EC 81 MG tablet Take 81 mg by mouth daily.   Yes Historical Provider, MD  calcium carbonate (TUMS - DOSED IN MG ELEMENTAL CALCIUM) 500 MG chewable tablet Chew 1 tablet by mouth 3 (three) times daily with meals. Phosphorous binder   Yes Historical Provider, MD  Chlorphen-Pseudoephed-APAP (CORICIDIN D PO) Take 1 tablet by mouth at bedtime as  needed. For cold symptoms   Yes Historical Provider, MD  estazolam (PROSOM) 2 MG tablet Take 1 mg by mouth at bedtime as needed. For sleep   Yes Historical Provider, MD  glimepiride (AMARYL) 4 MG tablet Take 2 mg by mouth daily.    Yes Historical Provider, MD  metoprolol tartrate (LOPRESSOR) 25 MG tablet Take 37.5 mg by mouth 2 (two) times daily.   Yes Historical Provider, MD  simvastatin (ZOCOR) 40 MG tablet Take 40 mg by mouth at bedtime.  11/16/11  Yes Historical Provider, MD  warfarin (COUMADIN) 5 MG tablet Take 2.5-5 mg by mouth daily. Takes 5 mg every day of the week except on Fridays, he takes 2.5 mg   Yes Historical Provider, MD    ZOX:[id number], acetaminophen, dextrose  Results for orders placed during the hospital encounter of 01/21/12 (from the past 48 hour(s))  CBC WITH DIFFERENTIAL     Status: Abnormal   Collection Time   01/21/12  6:09 PM      Component Value Range Comment   WBC 14.1 (*) 4.0 - 10.5 K/uL    RBC 3.73 (*) 4.22 - 5.81 MIL/uL    Hemoglobin 11.3 (*) 13.0 - 17.0 g/dL    HCT 14.7 (*) 82.9 - 52.0 %    MCV 90.1  78.0 - 100.0 fL    MCH 30.3  26.0 - 34.0 pg    MCHC 33.6  30.0 - 36.0 g/dL    RDW 56.2  13.0 - 86.5 %    Platelets 206  150 - 400 K/uL    Neutrophils Relative 85 (*) 43 - 77 %    Neutro Abs 12.0 (*) 1.7 - 7.7 K/uL    Lymphocytes Relative 6 (*) 12 - 46 %    Lymphs Abs 0.9  0.7 - 4.0 K/uL    Monocytes Relative 8  3 - 12 %    Monocytes Absolute 1.2 (*) 0.1 - 1.0 K/uL    Eosinophils Relative 0  0 - 5 %    Eosinophils Absolute 0.0  0.0 - 0.7 K/uL    Basophils Relative 0  0 - 1 %    Basophils Absolute 0.0  0.0 - 0.1 K/uL   COMPREHENSIVE METABOLIC PANEL     Status: Abnormal   Collection Time   01/21/12  6:09 PM      Component Value Range Comment   Sodium 134 (*) 135 - 145 mEq/L    Potassium 4.6  3.5 - 5.1 mEq/L    Chloride 89 (*) 96 - 112 mEq/L    CO2 29  19 - 32 mEq/L    Glucose, Bld  80  70 - 99 mg/dL    BUN 49 (*) 6 - 23 mg/dL    Creatinine, Ser  1.61 (*) 0.50 - 1.35 mg/dL    Calcium 9.0  8.4 - 09.6 mg/dL    Total Protein 7.8  6.0 - 8.3 g/dL    Albumin 3.4 (*) 3.5 - 5.2 g/dL    AST 19  0 - 37 U/L    ALT 12  0 - 53 U/L    Alkaline Phosphatase 53  39 - 117 U/L    Total Bilirubin 0.7  0.3 - 1.2 mg/dL    GFR calc non Af Amer 5 (*) >90 mL/min    GFR calc Af Amer 6 (*) >90 mL/min   PROTIME-INR     Status: Abnormal   Collection Time   01/21/12  6:09 PM      Component Value Range Comment   Prothrombin Time 37.8 (*) 11.6 - 15.2 seconds    INR 4.18 (*) 0.00 - 1.49   GLUCOSE, CAPILLARY     Status: Abnormal   Collection Time   01/22/12  1:42 AM      Component Value Range Comment   Glucose-Capillary 64 (*) 70 - 99 mg/dL   GLUCOSE, CAPILLARY     Status: Abnormal   Collection Time   01/22/12  2:24 AM      Component Value Range Comment   Glucose-Capillary 178 (*) 70 - 99 mg/dL   GLUCOSE, CAPILLARY     Status: Abnormal   Collection Time   01/22/12  4:02 AM      Component Value Range Comment   Glucose-Capillary 188 (*) 70 - 99 mg/dL    Comment 1 Documented in Chart      Comment 2 Notify RN     BASIC METABOLIC PANEL     Status: Abnormal   Collection Time   01/22/12  6:40 AM      Component Value Range Comment   Sodium 135  135 - 145 mEq/L    Potassium 3.8  3.5 - 5.1 mEq/L    Chloride 91 (*) 96 - 112 mEq/L    CO2 29  19 - 32 mEq/L    Glucose, Bld 105 (*) 70 - 99 mg/dL    BUN 55 (*) 6 - 23 mg/dL    Creatinine, Ser 0.45 (*) 0.50 - 1.35 mg/dL    Calcium 8.4  8.4 - 40.9 mg/dL    GFR calc non Af Amer 5 (*) >90 mL/min    GFR calc Af Amer 5 (*) >90 mL/min   CBC WITH DIFFERENTIAL     Status: Abnormal   Collection Time   01/22/12  6:40 AM      Component Value Range Comment   WBC 13.0 (*) 4.0 - 10.5 K/uL    RBC 3.20 (*) 4.22 - 5.81 MIL/uL    Hemoglobin 9.5 (*) 13.0 - 17.0 g/dL    HCT 81.1 (*) 91.4 - 52.0 %    MCV 89.4  78.0 - 100.0 fL    MCH 29.7  26.0 - 34.0 pg    MCHC 33.2  30.0 - 36.0 g/dL    RDW 78.2  95.6 - 21.3 %    Platelets 213   150 - 400 K/uL    Neutrophils Relative 79 (*) 43 - 77 %    Neutro Abs 10.4 (*) 1.7 - 7.7 K/uL    Lymphocytes Relative 11 (*) 12 - 46 %    Lymphs Abs 1.4  0.7 -  4.0 K/uL    Monocytes Relative 9  3 - 12 %    Monocytes Absolute 1.2 (*) 0.1 - 1.0 K/uL    Eosinophils Relative 0  0 - 5 %    Eosinophils Absolute 0.0  0.0 - 0.7 K/uL    Basophils Relative 0  0 - 1 %    Basophils Absolute 0.0  0.0 - 0.1 K/uL   PROTIME-INR     Status: Abnormal   Collection Time   01/22/12  6:40 AM      Component Value Range Comment   Prothrombin Time 44.9 (*) 11.6 - 15.2 seconds    INR 5.27 (*) 0.00 - 1.49   GLUCOSE, CAPILLARY     Status: Abnormal   Collection Time   01/22/12  8:15 AM      Component Value Range Comment   Glucose-Capillary 60 (*) 70 - 99 mg/dL   GLUCOSE, CAPILLARY     Status: Abnormal   Collection Time   01/22/12  9:09 AM      Component Value Range Comment   Glucose-Capillary 119 (*) 70 - 99 mg/dL      ROS:  Only positives as above in hp.   Physical Exam: Filed Vitals:   01/22/12 0816  BP: 106/70  Pulse: 79  Temp: 98.4 F (36.9 C)  Resp: 20     General: Alert, pleasant obese BM NAD, Appropriate HEENT: [place] , MMM Eyes: EOMI Neck: no jvd , supple  Heart: Irreg , irreg,   2/6 murmur at apex Lungs: CTA bilat Abdomen:  Obese, soft, nontender Extremities: Left BKA, Right lower leg with 2+ edema, and foot bandaged dry and clean  Not unwraapped Skin: no rash, ulcer as per vvsd notes , dressing ot unwrappedd Neuro:  Alert, OX3 Dialysis Access: pos. Bruit R U A AVF  Dialysis Orders: Center: [facility]   on TTS . EDW 106 kg HD Bath 2.0 k, 2.25 ca  Time  4 hrs 15 min Heparin 3,400units. Access Right upper arm  avf BFR 500 DFR 800    Hectoral 4 mcg IV/HD Epogen 1000 on tue and sat  IV/HD  Venofer  100mg   q weekly hd  Other 0  Assessment/Plan 1. Right Second Toe Gangrene= VVS [name]  Plans Amputation of toe after INR  Down as noted/ iv antibiotics 2. ESRD -  TTS HD [facility] /  today no Heparin  With inr elevated and surgery  pending 3. Hypertension/volume  - stable bp , ?? Wt today  107 and 127 kg / edw 106 kg/ uf 2 to 3 liters today as tolerated on hd/ noted on Metoprolol 37.5 mg bid and bp 106/70 will  Decrease to  12.5 mg bid hold am of hd 4. Anemia  - 9.5 hgb  increase epo on hd and weekly iron on hd.  Use Aranesp 100 mg weekly start today 5. Metabolic bone disease -  CA corrected to 9.0  , vit d  And  tums  As binder fu ca and phos 6. Atrial Fib with elevated  INR= Pharmacy dosing . No heparin with high inr today and cannulating avf 7 CAD  Sp CAGB 1996/ Ho mod. Aortic Stenosis not surgical candidate 8. DM Type 2 = per admit team   Alan Pastel, PA-C [facility] Beeper [phone] 01/22/2012, 11:27 AM   Patient seen and examined and reviewed with Alan [name], P.A..  Agree with assessment and plan as above. Vinson Moselle  MD [employer]  [phone] pgr    [phone] cell 01/22/2012, 4:40 PM

## 2012-01-22 NOTE — Procedures (Signed)
I was present at this dialysis session. I have reviewed the session itself and made appropriate changes.   Rob Deangelo Berns, MD [facility] 01/22/2012, 4:40 PM   

## 2012-01-23 ENCOUNTER — Encounter: Payer: MEDICARE | Admitting: Physical Therapy

## 2012-01-23 ENCOUNTER — Inpatient Hospital Stay (HOSPITAL_COMMUNITY): Payer: MEDICARE

## 2012-01-23 DIAGNOSIS — E119 Type 2 diabetes mellitus without complications: Secondary | ICD-10-CM

## 2012-01-23 LAB — CBC
HCT: 30.9 % — ABNORMAL LOW (ref 39.0–52.0)
MCH: 29.2 pg (ref 26.0–34.0)
MCV: 90.4 fL (ref 78.0–100.0)
Platelets: 229 10*3/uL (ref 150–400)
RBC: 3.42 MIL/uL — ABNORMAL LOW (ref 4.22–5.81)
RDW: 13 % (ref 11.5–15.5)
WBC: 13 10*3/uL — ABNORMAL HIGH (ref 4.0–10.5)

## 2012-01-23 LAB — BASIC METABOLIC PANEL
BUN: 34 mg/dL — ABNORMAL HIGH (ref 6–23)
CO2: 25 mEq/L (ref 19–32)
Calcium: 8.7 mg/dL (ref 8.4–10.5)
Chloride: 92 mEq/L — ABNORMAL LOW (ref 96–112)
Creatinine, Ser: 6.34 mg/dL — ABNORMAL HIGH (ref 0.50–1.35)

## 2012-01-23 LAB — MAGNESIUM: Magnesium: 2 mg/dL (ref 1.5–2.5)

## 2012-01-23 LAB — GLUCOSE, CAPILLARY: Glucose-Capillary: 122 mg/dL — ABNORMAL HIGH (ref 70–99)

## 2012-01-23 NOTE — Progress Notes (Signed)
ANTICOAGULATION CONSULT NOTE - Follow Up Consult  Pharmacy Consult for Coumadin Indication: atrial fibrillation Labs:  [facility] 01/23/12 0650 01/22/12 0640 01/21/12 1809  HGB 10.0* 9.5* --  HCT 30.9* 28.6* 33.6*  PLT 229 213 206  APTT -- -- --  LABPROT 31.6* 44.9* 37.8*  INR 3.28* 5.27* 4.18*  HEPARINUNFRC -- -- --  CREATININE 6.34* 9.53* 8.51*  CKTOTAL -- -- --  CKMB -- -- --  TROPONINI -- -- --    Assessment: Alan [name] was on Coumadin PTA, admitted with supratherapeutic INR and toe wound. INR continues to trend down with held Coumadin. OR plans for Friday- toe amputation. CBC stable, INR supratherapeutic at 3.28. No overt bleeding reported.  Goal of Therapy:  INR 2-3 Monitor platelets by anticoagulation protocol: Yes   Plan:  - No Coumadin today - Will f/up daily INR. Noted possible plans for Vit K induced INR reversal in AM if INR doesn't trend down significantly without reversal, pending surgery.  Thanks, Gerod Caligiuri K. Allena Katz, PharmD, BCPS.  Clinical Pharmacist Pager [phone]. 01/23/2012 10:02 AM

## 2012-01-23 NOTE — Progress Notes (Signed)
[facility]  Lab Results  Component Value Date   INR 3.28* 01/23/2012   INR 5.27* 01/22/2012   INR 4.18* 01/21/2012    Suspect INR will not reverse adequately until Friday at the earliest.  Will aim for R 2nd toe amputation on Friday.  Leonides Sake, MD [facility] [place] Office: [phone] Pager: [phone]  01/23/2012, 9:11 AM

## 2012-01-23 NOTE — Progress Notes (Signed)
Pt had 11 runs of v-tach. Contacting doctor now.

## 2012-01-23 NOTE — Progress Notes (Signed)
Subjective: No complaints  Objective Vital signs in last 24 hours: Filed Vitals:   01/22/12 1842 01/22/12 2115 01/22/12 2319 01/23/12 0504  BP: 123/56 100/54 134/66 102/63  Pulse: 89 118 104 62  Temp: 98.2 F (36.8 C) 102.5 F (39.2 C) 100.4 F (38 C) 99.5 F (37.5 C)  TempSrc: Oral Oral Oral Oral  Resp: 18 20 18 20   Height:      Weight: 127.1 kg (280 lb 3.3 oz)   121.7 kg (268 lb 4.8 oz)  SpO2: 100% 93% 95% 94%   Weight change: 23.3 kg (51 lb 5.9 oz)  Intake/Output Summary (Last 24 hours) at 01/23/12 1351 Last data filed at 01/22/12 2116  Gross per 24 hour  Intake     60 ml  Output   2800 ml  Net  -2740 ml   Labs: Basic Metabolic Panel:  Lab 01/23/12 0981 01/22/12 0640 01/21/12 1809  NA 133* 135 134*  K 4.7 3.8 4.6  CL 92* 91* 89*  CO2 25 29 29   GLUCOSE 135* 105* 80  BUN 34* 55* 49*  CREATININE 6.34* 9.53* 8.51*  ALB -- -- --  CALCIUM 8.7 8.4 9.0  PHOS -- -- --   Liver Function Tests:  Lab 01/21/12 1809  AST 19  ALT 12  ALKPHOS 53  BILITOT 0.7  PROT 7.8  ALBUMIN 3.4*   No results found for this basename: LIPASE:3,AMYLASE:3 in the last 168 hours No results found for this basename: AMMONIA:3 in the last 168 hours CBC:  Lab 01/23/12 0650 01/22/12 0640 01/21/12 1809  WBC 13.0* 13.0* 14.1*  NEUTROABS -- 10.4* 12.0*  HGB 10.0* 9.5* 11.3*  HCT 30.9* 28.6* 33.6*  MCV 90.4 89.4 90.1  PLT 229 213 206   PT/INR: @labrcntip (inr:5) Cardiac Enzymes: No results found for this basename: CKTOTAL:5,CKMB:5,CKMBINDEX:5,TROPONINI:5 in the last 168 hours CBG:  Lab 01/23/12 1200 01/23/12 0839 01/22/12 2311 01/22/12 1956 01/22/12 1138  GLUCAP 186* 122* 166* 88 110*    Iron Studies: No results found for this basename: IRON:30,TIBC:30,TRANSFERRIN:30,FERRITIN:30 in the last 168 hours  Physical Exam:  Blood pressure 102/63, pulse 62, temperature 99.5 F (37.5 C), temperature source Oral, resp. rate 20, height 6' (1.829 m), weight 121.7 kg (268 lb 4.8 oz), SpO2  94.00%.  Gen: no distress, alert Neck: no jvd , supple  Heart: Irreg , irreg, 2/6 murmur at apex  Lungs: CTA bilat  Abdomen: Obese, soft, nontender  Extremities: Left BKA, Right lower leg with 2+ edema, R 2nd toe darkened, some darkening w erythema also to forefoot, edema as well; there is a round R lat malleolus ulcer with eschar and no signs of infection  Skin: no rash Neuro: Alert, OX3  Dialysis Access: pos. Bruit R U A AVF   Dialysis Orders: Center: [facility] on TTS .  EDW 106 kg HD Bath 2.0 k, 2.25 ca Time 4 hrs 15 min Heparin 3,400units. Access Right upper arm avf BFR 500 DFR 800 Hectoral 4 mcg IV/HD Epogen 1000 on tue and sat IV/HD Venofer 100mg  q weekly hd  Other 0   Assessment/Plan  1. Right Second Toe Gangrene= VVS [name] Plans Amputation of toe after INR Down as noted/ iv antibiotics 2. ESRD - TTS HD [facility] / HD tomorrow, no Heparin With inr elevated and surgery pending 3. Hypertension/volume - BP normal. At home takes Metoprolol 37.5 mg bid; we have decreased to 12.5 mg bid hold am of hd due to soft BP's. Weights are inaccurate, off by 15-20kg, will  ask RN to reweigh and zero bed.  4. Anemia - 9.5 hgb increase epo on hd and weekly iron on hd. Use Aranesp 100 mg weekly start today 5. Metabolic bone disease - CA corrected to 9.0 , vit d And tums As binder fu ca and phos 6. Atrial Fib with elevated INR= Pharmacy dosing . No heparin with high inr and cannulating avf 7. CAD Sp CAGB 1996/ Ho mod. Aortic Stenosis not surgical candidate  8. DM Type 2 = per admit team     Vinson Moselle  MD [facility] [phone] pgr    [phone] cell 01/23/2012, 1:51 PM

## 2012-01-23 NOTE — Consult Note (Signed)
WOC consult Note Reason for Consult: Consult requested for wound to right outer ankle. VVS following for assessment and plan of care to toe.  Plans for amputation this week.  Pt has been followed by the outpatient wound care center for right outer ankle chronic wound.  He has a history of PVD.  He states they were using skin substitutes to promote healing.  This topical treatment is not available in the  [facility] formulary.  Will substitute Aquacel to provide antimicrobial benefits and absorb drainage.  Pt can resume follow-up with outpatient wound care center after discharge. Wound type: Full thickness arterial ulcer Measurement: 1X1X.3cm Wound bed: 100% red Drainage (amount, consistency, odor) No odor, mod yellow drainage. Periwound: Intact skin surrounding. Dressing procedure/placement/frequency:  He statesthe wound care center was using some type of skin substitute to promote healing.  This topical treatment is not available in the  [facility] formulary.  Will substitute Aquacel to provide antimicrobial benefits and absorb drainage.  Pt can resume follow-up with outpatient wound care center after discharge. Will not plan to follow further unless re-consulted.  [address] RN, MSN, [employer]  [phone]

## 2012-01-23 NOTE — Progress Notes (Signed)
[facility] PROGRESS NOTE  Alan [name] Cilia ZOX:[id number] DOB: 1931/09/02 DOA: 01/21/2012 PCP: Junious Silk, MD  Assessment/Plan: SIRS (systemic inflammatory response syndrome)  - infection of the right second toe wound with concern for gangrene.. Fever spike last evening. VSS. Non-toxic appearing. WC stable at 13.   contininue IV vancomycin and Unasyn dosed per pharmacy day #3.  -Patient denies any pain at this time however also is poor sensation of the distal extremities.  -wound Consult placed.  -Vascular evaluating for surgery  Gangrenous right second toe -per vascular surgery.  -anticipate surgery when INR 1.5   CAD (coronary artery disease)  Continue with metoprolol and statin.  Diabetes mellitus  Fair control. continue sliding scale insulin. Hold oral hypoglycemic. Patient is currently n.p.o. for surgical evaluation. He was noted to have some drop in his blood glucose is 64 and given D50 in the ED. Continue to monitor his blood glucose closely  End-stage renal disease on dialysis  Due for his dialysis on 1/16. Appreciate nephrology . Is a tues/thurs/sat dialysis schedule  Atrial fibrillation  Patient was on Coumadin with supratherapeutic INR. Continue to hold his Coumadin.. Continue to monitor INR. No s/sx bleeding. If to have surgery consider reversing.     Code Status: full Family Communication:  Disposition Plan: home when ready   Consultants:  Vascular  renal  Procedures:  hemodyalysis 01/22/12  Antibiotics: Vancomycin 01/21/12  unasyn 01/21/11 >>>     HPI/Subjective: Awake alert oriented x3. Non-toxic appearing. Denies pain/discomfort  Objective: Filed Vitals:   01/22/12 1842 01/22/12 2115 01/22/12 2319 01/23/12 0504  BP: 123/56 100/54 134/66 102/63  Pulse: 89 118 104 62  Temp: 98.2 F (36.8 C) 102.5 F (39.2 C) 100.4 F (38 C) 99.5 F (37.5 C)  TempSrc: Oral Oral Oral Oral  Resp: 18 20 18 20   Height:      Weight: 127.1 kg (280 lb  3.3 oz)   121.7 kg (268 lb 4.8 oz)  SpO2: 100% 93% 95% 94%    Intake/Output Summary (Last 24 hours) at 01/23/12 0811 Last data filed at 01/22/12 2116  Gross per 24 hour  Intake     60 ml  Output   2800 ml  Net  -2740 ml   Filed Weights   01/22/12 1356 01/22/12 1842 01/23/12 0504  Weight: 130.3 kg (287 lb 4.2 oz) 127.1 kg (280 lb 3.3 oz) 121.7 kg (268 lb 4.8 oz)    Exam:   General:  Well nourished alert NAD  Cardiovascular: RRR + murmur LBKA  Respiratory: normal effort BSCTAB no rhonchi/wheeze  Abdomen: obese, non-tender to palpation +Bs  Extremities: lBKA. Right foot dressing small amount blood. Poor sensation  Data Reviewed: Basic Metabolic Panel:  Lab 01/22/12 8119 01/21/12 1809  NA 135 134*  K 3.8 4.6  CL 91* 89*  CO2 29 29  GLUCOSE 105* 80  BUN 55* 49*  CREATININE 9.53* 8.51*  CALCIUM 8.4 9.0  MG -- --  PHOS -- --   Liver Function Tests:  Lab 01/21/12 1809  AST 19  ALT 12  ALKPHOS 53  BILITOT 0.7  PROT 7.8  ALBUMIN 3.4*   No results found for this basename: LIPASE:5,AMYLASE:5 in the last 168 hours No results found for this basename: AMMONIA:5 in the last 168 hours CBC:  Lab 01/23/12 0650 01/22/12 0640 01/21/12 1809  WBC 13.0* 13.0* 14.1*  NEUTROABS -- 10.4* 12.0*  HGB 10.0* 9.5* 11.3*  HCT 30.9* 28.6* 33.6*  MCV 90.4 89.4 90.1  PLT 229 213  206   Cardiac Enzymes: No results found for this basename: CKTOTAL:5,CKMB:5,CKMBINDEX:5,TROPONINI:5 in the last 168 hours BNP (last 3 results) No results found for this basename: PROBNP:3 in the last 8760 hours CBG:  Lab 01/22/12 2311 01/22/12 1956 01/22/12 1138 01/22/12 0909 01/22/12 0815  GLUCAP 166* 88 110* 119* 60*    Recent Results (from the past 240 hour(s))  CULTURE, BLOOD (ROUTINE X 2)     Status: Normal (Preliminary result)   Collection Time   01/22/12  6:40 AM      Component Value Range Status Comment   Specimen Description BLOOD RIGHT ARM   Final    Special Requests BOTTLES DRAWN  AEROBIC AND ANAEROBIC 10CC   Final    Culture  Setup Time 01/22/2012 15:58   Final    Culture     Final    Value:        BLOOD CULTURE RECEIVED NO GROWTH TO DATE CULTURE WILL BE HELD FOR 5 DAYS BEFORE ISSUING A FINAL NEGATIVE REPORT   Report Status PENDING   Incomplete   CULTURE, BLOOD (ROUTINE X 2)     Status: Normal (Preliminary result)   Collection Time   01/22/12  6:45 AM      Component Value Range Status Comment   Specimen Description BLOOD LEFT ANTECUBITAL   Final    Special Requests BOTTLES DRAWN AEROBIC AND ANAEROBIC 10CC   Final    Culture  Setup Time 01/22/2012 15:58   Final    Culture     Final    Value:        BLOOD CULTURE RECEIVED NO GROWTH TO DATE CULTURE WILL BE HELD FOR 5 DAYS BEFORE ISSUING A FINAL NEGATIVE REPORT   Report Status PENDING   Incomplete   MRSA PCR SCREENING     Status: Normal   Collection Time   01/22/12  7:45 AM      Component Value Range Status Comment   MRSA by PCR NEGATIVE  NEGATIVE Final      Studies: No results found.  Scheduled Meds:   . allopurinol  150 mg Oral Daily  . ampicillin-sulbactam (UNASYN) IV  1.5 g Intravenous Q12H  . calcium carbonate  1 tablet Oral TID WC  . darbepoetin (ARANESP) injection - DIALYSIS  150 mcg Intravenous Q Tue-HD  . ferric gluconate (FERRLECIT/NULECIT) IV  125 mg Intravenous Q Thu-HD  . insulin aspart  0-9 Units Subcutaneous Q4H  . metoprolol tartrate  37.5 mg Oral BID  . multivitamin  1 tablet Oral QHS  . paricalcitol  4 mcg Intravenous Q T,Th,Sa-HD  . simvastatin  40 mg Oral QHS  . sodium chloride  3 mL Intravenous Q12H  . vancomycin  1,000 mg Intravenous Q T,Th,Sa-HD   Continuous Infusions:   Active Problems:  Aortic stenosis  CAD (coronary artery disease)  End stage renal disease on dialysis  Diabetes mellitus  Atherosclerosis of native arteries of the extremities with ulceration(440.23)  SIRS (systemic inflammatory response syndrome)  Wound infection  Unilateral complete BKA  Atrial  fibrillation    Time spent: 30 minutes    [facility] M  [facility]  If 8PM-8AM, please contact night-coverage at [email or web address], password [facility] 01/23/2012, 8:11 AM  LOS: 2 days     Attending Patient seen and examined, agree with the assessment and plan as outlined above. He now has fever-likely from gangrenous right toe. Continue with current abx-for amputation on Friday.   S Hosteen Kienast

## 2012-01-23 NOTE — Progress Notes (Signed)
Physical Therapy Treatment Patient Details Name: CORIN TILLY MRN: [id number] DOB: September 11, 1931 Today's Date: 01/23/2012 Time: 0145-0215 PT Time Calculation (min): 30 min  PT Assessment / Plan / Recommendation Comments on Treatment Session  Pt still without prosthesis at this date.  Wife present & reports she will have it here tomorrow.  Pt able to perform sit<>stand transfers & Bed>recliner but requires +2 assist.      Follow Up Recommendations   (to be determined after surgery on Rt foot)     Does the patient have the potential to tolerate intense rehabilitation     Barriers to Discharge        Equipment Recommendations  None recommended by PT    Recommendations for Other Services    Frequency Min 3X/week   Plan Discharge plan remains appropriate    Precautions / Restrictions Precautions Precautions: Fall Restrictions Weight Bearing Restrictions: No       Mobility  Bed Mobility Bed Mobility: Supine to Sit;Sitting - Scoot to Edge of Bed Supine to Sit: 5: Supervision Sitting - Scoot to Edge of Bed: 5: Supervision Transfers Transfers: Sit to Stand;Stand to Sit Sit to Stand: 1: +2 Total assist;With upper extremity assist;From bed;From chair/3-in-1;With armrests Sit to Stand: Patient Percentage: 60% Stand to Sit: 1: +2 Total assist;With upper extremity assist;With armrests;To bed;To chair/3-in-1 Stand to Sit: Patient Percentage: 50% Stand Pivot Transfers: 1: +2 Total assist Stand Pivot Transfers: Patient Percentage: 60% Details for Transfer Assistance: Cues for intiiation, hand placement, & technique.  (A) to achieve standing, anterior translation of trunk over BOS, balance, & controlled descent.    Pt leans to Rt side & has difficulty pivoting on Rt foot.      Exercises     PT Diagnosis:    PT Problem List:   PT Treatment Interventions:     PT Goals Acute Rehab PT Goals Time For Goal Achievement: 01/29/12 Potential to Achieve Goals: Good Pt will go Sit to  Stand: with min assist PT Goal: Sit to Stand - Progress: Progressing toward goal Pt will go Stand to Sit: with min assist PT Goal: Stand to Sit - Progress: Progressing toward goal Pt will Transfer Bed to Chair/Chair to Bed: with min assist PT Transfer Goal: Bed to Chair/Chair to Bed - Progress: Progressing toward goal Pt will Stand: with min assist;1 - 2 min  Visit Information  Last PT Received On: 01/23/12 Assistance Needed: +2    Subjective Data      Cognition  Overall Cognitive Status: Appears within functional limits for tasks assessed/performed Arousal/Alertness: Awake/alert Orientation Level: Appears intact for tasks assessed Behavior During Session: [facility] for tasks performed    Balance     End of Session PT - End of Session Equipment Utilized During Treatment: Gait belt Activity Tolerance: Patient tolerated treatment well Patient left: in chair;with call bell/phone within reach;with family/visitor present Nurse Communication: Mobility status     Verdell Face, [place] [phone] 01/23/2012

## 2012-01-23 NOTE — Progress Notes (Signed)
Pt asked not to be given insulin throughout the night because that is not how he does it at home and wants to continue his own regimen. RN educated pt on reason to take med, pt refused. CBG 166.

## 2012-01-24 DIAGNOSIS — I2510 Atherosclerotic heart disease of native coronary artery without angina pectoris: Secondary | ICD-10-CM

## 2012-01-24 DIAGNOSIS — I359 Nonrheumatic aortic valve disorder, unspecified: Secondary | ICD-10-CM

## 2012-01-24 DIAGNOSIS — I4891 Unspecified atrial fibrillation: Secondary | ICD-10-CM

## 2012-01-24 DIAGNOSIS — I96 Gangrene, not elsewhere classified: Secondary | ICD-10-CM

## 2012-01-24 DIAGNOSIS — Z01810 Encounter for preprocedural cardiovascular examination: Secondary | ICD-10-CM

## 2012-01-24 LAB — RENAL FUNCTION PANEL
Calcium: 8.6 mg/dL (ref 8.4–10.5)
GFR calc Af Amer: 6 mL/min — ABNORMAL LOW (ref >90)
GFR calc non Af Amer: 5 mL/min — ABNORMAL LOW (ref >90)
Glucose, Bld: 84 mg/dL (ref 70–99)
Phosphorus: 4.4 mg/dL (ref 2.3–4.6)
Sodium: 135 mEq/L (ref 135–145)

## 2012-01-24 LAB — GLUCOSE, CAPILLARY
Glucose-Capillary: 77 mg/dL (ref 70–99)
Glucose-Capillary: 154 mg/dL — ABNORMAL HIGH (ref 70–99)
Glucose-Capillary: 170 mg/dL — ABNORMAL HIGH (ref 70–99)

## 2012-01-24 LAB — CBC
MCH: 29.6 pg (ref 26.0–34.0)
MCV: 89.3 fL (ref 78.0–100.0)
Platelets: 252 10*3/uL (ref 150–400)
RDW: 12.9 % (ref 11.5–15.5)

## 2012-01-24 MED ORDER — VITAMIN K1 10 MG/ML IJ SOLN
1.0000 mg | Freq: Once | INTRAMUSCULAR | Status: AC
  Administered 2012-01-24: 1 mg via INTRAVENOUS
  Filled 2012-01-24: qty 0.1

## 2012-01-24 MED ORDER — PARICALCITOL 5 MCG/ML IV SOLN
INTRAVENOUS | Status: AC
  Filled 2012-01-24: qty 1

## 2012-01-24 NOTE — Progress Notes (Signed)
[facility] [place]  Daily Progress Note  Assessment/Planning: R foot gangrene   Check INR today, if therapeutic reverse with FFP/Vitamin K  Scheduled for R 2nd toe amputation tomorrow if INR ok  Subjective    Pain ok  Objective Filed Vitals:   01/23/12 0504 01/23/12 1406 01/23/12 2114 01/24/12 0546  BP: 102/63  135/77 112/64  Pulse: 62  92 85  Temp: 99.5 F (37.5 C)  100.1 F (37.8 C) 97.4 F (36.3 C)  TempSrc: Oral  Oral Oral  Resp: 20  18 20   Height:      Weight: 268 lb 4.8 oz (121.7 kg) 221 lb 1.9 oz (100.3 kg)  267 lb 13.7 oz (121.5 kg)  SpO2: 94%  96% 94%    Intake/Output Summary (Last 24 hours) at 01/24/12 0625 Last data filed at 01/24/12 0602  Gross per 24 hour  Intake    200 ml  Output      0 ml  Net    200 ml    PULM  CTAB CV  RRR GI  soft, NTND VASC  R foot: 2nd toe dead, lateral malleolus ulcer: partial thickness  Laboratory CBC    Component Value Date/Time   WBC 13.0* 01/23/2012 0650   HGB 10.0* 01/23/2012 0650   HCT 30.9* 01/23/2012 0650   PLT 229 01/23/2012 0650    BMET    Component Value Date/Time   NA 133* 01/23/2012 0650   K 4.7 01/23/2012 0650   CL 92* 01/23/2012 0650   CO2 25 01/23/2012 0650   GLUCOSE 135* 01/23/2012 0650   BUN 34* 01/23/2012 0650   CREATININE 6.34* 01/23/2012 0650   CALCIUM 8.7 01/23/2012 0650   CALCIUM 8.8 10/27/2010 0734   GFRNONAA 7* 01/23/2012 0650   GFRAA 9* 01/23/2012 0650   Lab Results  Component Value Date   INR 3.28* 01/23/2012   INR 5.27* 01/22/2012   INR 4.18* 01/21/2012    Alan Sake, MD [facility] [place] Office: [phone] Pager: [phone]  01/24/2012, 6:25 AM

## 2012-01-24 NOTE — Progress Notes (Signed)
Subjective:  On hd Objective Vital signs in last 24 hours: Filed Vitals:   01/24/12 0830 01/24/12 0900 01/24/12 0930 01/24/12 1000  BP: 121/57 124/60 124/61 109/50  Pulse: 89 93 90 86  Temp:      TempSrc:      Resp: 16 16 16 16   Height:      Weight:      SpO2:       Weight change: -30 kg (-66 lb 2.2 oz)  Intake/Output Summary (Last 24 hours) at 01/24/12 1004 Last data filed at 01/24/12 0602  Gross per 24 hour  Intake    200 ml  Output      0 ml  Net    200 ml   Labs: Basic Metabolic Panel:  Lab 01/24/12 8295 01/23/12 0650 01/22/12 0640  NA 135 133* 135  K 4.3 4.7 3.8  CL 93* 92* 91*  CO2 26 25 29   GLUCOSE 84 135* 105*  BUN 50* 34* 55*  CREATININE 8.51* 6.34* 9.53*  CALCIUM 8.6 8.7 8.4  ALB -- -- --  PHOS 4.4 -- --   Liver Function Tests:  Lab 01/24/12 0741 01/21/12 1809  AST -- 19  ALT -- 12  ALKPHOS -- 53  BILITOT -- 0.7  PROT -- 7.8  ALBUMIN 2.7* 3.4*   No results found for this basename: LIPASE:3,AMYLASE:3 in the last 168 hours No results found for this basename: AMMONIA:3 in the last 168 hours CBC:  Lab 01/24/12 0500 01/23/12 0650 01/22/12 0640 01/21/12 1809  WBC 12.5* 13.0* 13.0* --  NEUTROABS -- -- 10.4* 12.0*  HGB 9.7* 10.0* 9.5* --  HCT 29.3* 30.9* 28.6* --  MCV 89.3 90.4 89.4 90.1  PLT 252 229 213 --   Cardiac Enzymes: No results found for this basename: CKTOTAL:5,CKMB:5,CKMBINDEX:5,TROPONINI:5 in the last 168 hours CBG:  Lab 01/24/12 0409 01/23/12 2341 01/23/12 2051 01/23/12 1653 01/23/12 1200  GLUCAP 77 123* 189* 124* 186*    Iron Studies: No results found for this basename: IRON,TIBC,TRANSFERRIN,FERRITIN in the last 72 hours Studies/Results: Dg Chest Port 1 View  01/23/2012  *RADIOLOGY REPORT*  Clinical Data: Fever.  Increasing cough and shortness of breath.  PORTABLE CHEST - 1 VIEW  Comparison: 07/18/2011  Findings: There is chronic cardiomegaly with tortuosity of the thoracic aorta.  There is a prominent left pericardial fat pad.  There are no infiltrates or effusions.  No pulmonary edema.  No acute osseous abnormality.  Evidence of prior CABG.  IMPRESSION: Chronic cardiomegaly.  No acute abnormality.   Original Report Authenticated By: Francene Boyers, M.D.    Medications:      . allopurinol  150 mg Oral Daily  . ampicillin-sulbactam (UNASYN) IV  1.5 g Intravenous Q12H  . calcium carbonate  1 tablet Oral TID WC  . darbepoetin (ARANESP) injection - DIALYSIS  150 mcg Intravenous Q Tue-HD  . ferric gluconate (FERRLECIT/NULECIT) IV  125 mg Intravenous Q Thu-HD  . insulin aspart  0-9 Units Subcutaneous Q4H  . metoprolol tartrate  37.5 mg Oral BID  . multivitamin  1 tablet Oral QHS  . paricalcitol      . paricalcitol  4 mcg Intravenous Q T,Th,Sa-HD  . phytonadione (VITAMIN K) IV  1 mg Intravenous Once  . simvastatin  40 mg Oral QHS  . sodium chloride  3 mL Intravenous Q12H  . vancomycin  1,000 mg Intravenous Q T,Th,Sa-HD   I  have reviewed scheduled and prn medications.  Physical Exam:  Gen: no distress, alert , on hd  Heart: Irreg , irreg, 2/6 murmur at apex   A. Fib 92 rate on tele Lungs: CTA bilat  Abdomen: Obese, soft, nontender  Extremities: Left BKA, Right lower leg with 2+ edema, R 2nd toe darkened, some darkening w erythema also to forefoot, edema as well; there is a round R lat malleolus ulcer with eschar and no signs of infection  Dialysis Access:  Patent on hd. R U A AVF   Dialysis Orders: Center: [facility] on TTS .  EDW 106 kg HD Bath 2.0 k, 2.25 ca Time 4 hrs 15 min Heparin 3,400units. Access Right upper arm avf BFR 500 DFR 800 Hectoral 4 mcg IV/HD Epogen 1000 on tue and sat IV/HD Venofer 100mg  q weekly hd  Other 0   Assessment/Plan  1. Right Second Toe Gangrene= VVS [name] Plans Amputation of toe after INR Down as noted/ iv antibiotics/  pending amputation  Concerning inr  Level/ rx per vvs 2. ESRD - TTS HD [facility] / HD tomorrow, no Heparin With inr elevated and surgery  pending 3. Hypertension/volume - BP stable on hd so far this am/. At home takes Metoprolol 37.5 mg bid; we have decreased to 12.5 mg bid hold am of hd due to soft BP's. Weights are inaccurate still  121.5 and 128.3 checked twice  Up from 100.3 yesterday EDW=106 kg and with pt npo for surgery not correlating   4. Anemia - 9.5 >9.7hgb increase epo on hd and weekly iron on hd. Use Aranesp 100 mg weekly start today 5. Metabolic bone disease - CA corrected to 9.6, Phos 4.4/ vit d And tums As binder fu ca and phos 6. Atrial Fib with elevated INR= Pharmacy dosing . No heparin with high inr and  surgery 7. CAD Sp CAGB 1996/ Ho mod. Aortic Stenosis not surgical candidate  8. DM Type 2 = per admit team   Lenny Pastel, PA-C [facility] Bee[facility] [phone] 01/24/2012,10:04 AM  LOS: 3 days   Patient seen and examined and reviewed with Lenny Pastel, P.A..  Agree with assessment and plan as above. Vinson Moselle  MD [facility] [phone] pgr    [phone] cell 01/24/2012, 11:05 AM

## 2012-01-24 NOTE — Progress Notes (Signed)
Dialysis initiated via RUA AVF 15gax1" up/down without difficulty.  Outpatient plan of care requested.

## 2012-01-24 NOTE — Progress Notes (Signed)
ANTICOAGULATION CONSULT NOTE - Follow Up Consult  Pharmacy Consult for Coumadin, Vancomycin & Unasyn Indication: atrial fibrillation/  Right great toe infection  Allergies  Allergen Reactions  . Ancef (Cefazolin) Rash    Patient Measurements: Height: 6' (182.9 cm) Weight: 277 lb 12.5 oz (126 kg) IBW/kg (Calculated) : 77.6   Vital Signs: Temp: 98.6 F (37 C) (01/16 1336) Temp src: Oral (01/16 1336) BP: 126/74 mmHg (01/16 1336) Pulse Rate: 129  (01/16 1336)  Labs:  Basename 01/24/12 0741 01/24/12 0500 01/23/12 0650 01/22/12 0640  HGB -- 9.7* 10.0* --  HCT -- 29.3* 30.9* 28.6*  PLT -- 252 229 213  APTT -- -- -- --  LABPROT -- 29.0* 31.6* 44.9*  INR -- 2.92* 3.28* 5.27*  HEPARINUNFRC -- -- -- --  CREATININE 8.51* -- 6.34* 9.53*  CKTOTAL -- -- -- --  CKMB -- -- -- --  TROPONINI -- -- -- --    Estimated Creatinine Clearance: 9.8 ml/min (by C-G formula based on Cr of 8.51).  Assessment:   INR trending down but remains therapeutic today. Last Coumadin dose 01/20/12, prior to admission.  Planning R great toe amputation on 01/25/12. Vitamin K 1 mg IV ordered this am per VVS. Given. Discussed briefly with R. Roczniak PA.  No FFP planned today.   Day # 3 Unasyn and Vancomycin. Vancomycin 1 gram IV given with dialysis today.  Tmax 100.1, WBC 12.5.  Goal of Therapy:  INR 2-3 Monitor platelets by anticoagulation protocol: Yes Pre-dialysis Vancomycin levels 15-25 mcg/ml Appropriate Unasyn dose for renal function   Plan:   Vitamin K 1 mg IV today as per VVS.  Continue to hold Coumadin.  Continue Vancomycin 1 gram IV with dialysis TTS.  Continue Unasyn 1.5 grams IV q12hrs.  Daily PT/INR.  Dennie Fetters, [place] Pager: [phone] 01/24/2012,1:42 PM

## 2012-01-24 NOTE — Consult Note (Signed)
Patient ID: Alan [name] MRN: [id number] DOB/AGE: 1931-03-01 79 y.o.  Admit date: 01/21/2012 Referring Physician: Jerral Ralph Primary Cardiologist: Peter Swaziland Reason for Consultation: CAD/Aortic stenosis/Atrial fibrillation  HPI:  79 yo male with history of CAD s/p CABG, severe aortic valve stenosis, DM, HTN, HLD, ESRD on HD, atrial fibrillation on chronic coumadin therapy, PAD with left BKA 2013 admitted with gangrenous right second toe. Plans in place for amputation right toe tomorrow if INR is less than 1.5.  His cardiac issues have been followed for many years in our office by Dr. Peter Swaziland. TEE May 2013 with normal LV function, severe aortic stenosis (mean gradient 42mm Hg). Last cath April 11,2005 with occluded LAD, severe mid RCA stenosis and three patent bypass grafts to LAD, Diagonal and RCA.   He denies any chest pain, SOB, dizziness, near syncope or syncope. His weight has been stable. No orthopnea, PND, LE edema. His only complaint is right heel pain. He is limited in mobility secondary to his left BKA but denies any exertional chest pressure or SOB. He has been tolerating dialysis well.   Past Medical History  Diagnosis Date  . Diabetes mellitus   . Gout   . Hypertension   . Hyperlipidemia   . Chronic kidney disease     Started dialysis October 2012  . Arthritis   . Prostate cancer     s/p seed implant  . Cataract     BILATERAL-BEEN REMOVED  . Neuromuscular disorder     DABETIC NEUROPATHY-LOWER EXTREMITY  . Atrial fibrillation     on coumadin  . Aortic stenosis     moderate to severe, not felt to be a surgical candidate  . Anemia     secondary to end stage renal disease  . Coronary artery disease     remote CABG in 1996; last stress test in 2005; last cath 2005  . Diabetic neuropathy   . Edema   . PAD (peripheral artery disease)   . Diabetic retinopathy(362.0)   . Chronic anticoagulation     on coumadin  . Nephrolithiasis     prior history of  percutaneous nephrostomy  . Shortness of breath     Family History  Problem Relation Age of Onset  . Heart disease Father   . Hypertension Brother   . Stroke Brother   . Diabetes Mother     Pt. not sure, but he thinks she did.    History   Social History  . Marital Status: Married    Spouse Name: N/A    Number of Children: N/A  . Years of Education: N/A   Occupational History  . Not on file.   Social History Main Topics  . Smoking status: Former Smoker -- 2 years    Types: Cigarettes    Quit date: 04/10/1968  . Smokeless tobacco: Former Neurosurgeon    Types: Chew  . Alcohol Use: No  . Drug Use: No  . Sexually Active: No   Other Topics Concern  . Not on file   Social History Narrative  . No narrative on file    Past Surgical History  Procedure Date  . Ventral hernia repair   . Inguinal hernia repair     right side  . Colonoscopy   . Kidney stone surgery   . Polypectomy   . Tonsillectomy   . Toe surgery     removal little toe right foot  . Cardiac catheterization 04/19/2003    SEVERE 2 VESSEL OBSTRUCTIVE ATHERSCLEROTIC  CAD  . Coronary artery bypass graft 1996    LIMA GRAFT TO THE LAD, SEQUENTIAL SAPHENOUS  VEIN GRAFT TO THE FIRST AND SECOND DIAGONIAL BRANCHES, SEQUENTIAL SAPHENOUS VEIN GRAFT TO THE ACUTE MARGINAL, POSTERIOR DESCENDING, AND POSTERIOR LATERAL BRANCHES OF THE RIGHT CORONARY ARTERY  . Radioactive seed implant   . US echocardiography 09/06/2009    EF 55-60%  . Cardiovascular stress test 04/13/2003    EF 54%. EVIDENCE OF ANTERO-APICAL ISCHEMIA. NORMAL LV SIZE AND FUNCTION  . Tee without cardioversion 05/09/2011    Procedure: TRANSESOPHAGEAL ECHOCARDIOGRAM (TEE);  Surgeon: Laurey Morale, MD;  Location: [facility] ENDOSCOPY;  Service: Cardiovascular;  Laterality: N/A;  . Hernia repair   . Amputation 07/20/2011    Procedure: AMPUTATION BELOW KNEE;  Surgeon: Nada Libman, MD;  Location: [facility] OR;  Service: Vascular;  Laterality: Left;    Allergies  Allergen Reactions   . Ancef (Cefazolin) Rash    Current Facility-Administered Medications  Medication Dose Route Frequency Provider Last Rate Last Dose  . acetaminophen (TYLENOL) tablet 650 mg  650 mg Oral Q6H PRN Nishant Dhungel, MD   650 mg at 01/22/12 2225   Or  . acetaminophen (TYLENOL) suppository 650 mg  650 mg Rectal Q6H PRN Nishant Dhungel, MD   650 mg at 01/22/12 0350  . allopurinol (ZYLOPRIM) tablet 150 mg  150 mg Oral Daily Nishant Dhungel, MD   150 mg at 01/24/12 1328  . ampicillin-sulbactam (UNASYN) 1.5 g in sodium chloride 0.9 % 50 mL IVPB  1.5 g Intravenous Q12H Clydia Llano, MD   1.5 g at 01/24/12 0602  . calcium carbonate (TUMS - dosed in mg elemental calcium) chewable tablet 200 mg of elemental calcium  1 tablet Oral TID WC Nishant Dhungel, MD   200 mg of elemental calcium at 01/24/12 1329  . darbepoetin (ARANESP) injection 150 mcg  150 mcg Intravenous Q Tue-HD Donald Pore, PA   150 mcg at 01/22/12 1736  . ferric gluconate (NULECIT) 125 mg in sodium chloride 0.9 % 100 mL IVPB  125 mg Intravenous Q Thu-HD Donald Pore, PA      . insulin aspart (novoLOG) injection 0-9 Units  0-9 Units Subcutaneous Q4H Nishant Dhungel, MD   1 Units at 01/24/12 0036  . metoprolol tartrate (LOPRESSOR) tablet 37.5 mg  37.5 mg Oral BID Clydia Llano, MD   37.5 mg at 01/24/12 1329  . multivitamin (RENA-VIT) tablet 1 tablet  1 tablet Oral QHS Donald Pore, PA   1 tablet at 01/23/12 2102  . paricalcitol (ZEMPLAR) 5 MCG/ML injection           . paricalcitol (ZEMPLAR) injection 4 mcg  4 mcg Intravenous Q T,Th,Sa-HD Donald Pore, PA   4 mcg at 01/24/12 0801  . simvastatin (ZOCOR) tablet 40 mg  40 mg Oral QHS Nishant Dhungel, MD   40 mg at 01/23/12 2102  . sodium chloride 0.9 % injection 3 mL  3 mL Intravenous Q12H Nishant Dhungel, MD   3 mL at 01/24/12 1328  . vancomycin (VANCOCIN) IVPB 1000 mg/200 mL premix  1,000 mg Intravenous Q T,Th,Sa-HD Clydia Llano, MD   1,000 mg at 01/24/12 1037    Review of systems  complete and found to be negative unless listed above   Physical Exam: Blood pressure 126/74, pulse 85, temperature 98.6 F (37 C), temperature source Oral, resp. rate 20, height 6' (1.829 m), weight 277 lb 12.5 oz (126 kg), SpO2 98.00%.    General: Well developed, well nourished,  NAD  HEENT: OP clear, mucus membranes moist  SKIN: warm, dry. No rashes.  Neuro: No focal deficits  Musculoskeletal: Muscle strength 5/5 all ext  Psychiatric: Mood and affect normal  Neck: No JVD, no carotid bruits, no thyromegaly, no lymphadenopathy.  Lungs:Clear bilaterally, no wheezes, rhonci, crackles  Cardiovascular: Regular rate and rhythm. Harsh systolic murmur. No gallops or rubs.  Abdomen:Soft. Bowel sounds present. Non-tender.  Extremities: No right lower extremity edema. Bandage right foot. Left BKA.   Labs:   Lab Results  Component Value Date   WBC 12.5* 01/24/2012   HGB 9.7* 01/24/2012   HCT 29.3* 01/24/2012   MCV 89.3 01/24/2012   PLT 252 01/24/2012    Lab 01/24/12 0741 01/21/12 1809  NA 135 --  K 4.3 --  CL 93* --  CO2 26 --  BUN 50* --  CREATININE 8.51* --  CALCIUM 8.6 --  PROT -- 7.8  BILITOT -- 0.7  ALKPHOS -- 53  ALT -- 12  AST -- 19  GLUCOSE 84 --   Lab Results  Component Value Date   CKTOTAL 104 05/03/2011   CKMB 1.2 05/03/2011   TROPONINI <0.30 05/03/2011      EKG:  Echo 05/04/11: Left ventricle: The cavity size was normal. Wall thickness was increased in a pattern of mild LVH. - Aortic valve: AV is thickened, calcified with restricted motion. Peak and mean gradients through the valve are 46 and 28 mm Hg respectively consistent with moderate AS. Valve area: 0.49cm^2(VTI). Valve area: 0.53cm^2 (Vmax). - Right atrium: The atrium was mildly dilated. - Pulmonary arteries: PA peak pressure: 40mm Hg (S).  TEE 05/09/11: Left ventricle: The cavity size was normal. Wall thickness was increased in a pattern of mild LVH. Systolic function was normal. The estimated ejection  fraction was in the range of 55% to 60%. Wall motion was normal; there were no regional wall motion abnormalities. - Aortic valve: Trileaflet; severely calcified leaflets. Difficult to comment on presence or absence of vegetation given heavy calcification, but suspect no vegetation is present. There was severe stenosis. Mean gradient:34mm Hg (S). Peak gradient: 63mm Hg (S). Valve area: 0.5cm^2(VTI). - Aorta: Normal caliber aorta, grade III plaque in arch and descending thoracic aorta. - Mitral valve: Mildly to moderately calcified annulus. Mildly calcified leaflets . No evidence of vegetation. Trivial regurgitation. - Left atrium: The atrium was mildly dilated. No evidence of thrombus in the atrial cavity or appendage. - Right atrium: No evidence of thrombus in the atrial cavity or appendage. - Atrial septum: No defect or patent foramen ovale was identified. Echo contrast study showed no right-to-left atrial level shunt, at baseline or with provocation. - Tricuspid valve: No evidence of vegetation. Peak RV-RA gradient: 27mm Hg (S). - Pulmonic valve: No evidence of vegetation. Impressions:  - No evidence for endocarditis. Severe aortic stenosis.  ASSESSMENT AND PLAN:   1. Pre-operative cardiovascular examination: No angina, CHF, syncope. CAD is stable. Asymptomatic aortic stenosis. He is not on telemetry. No EKG on paper chart or on EPIC. Will order EKG this afternoon. Probably a good idea to monitor him on a telemetry unit. Continue beta blocker. Would proceed with surgery as planned without further cardiac workup. We will follow along with you.   2. Severe aortic valve stenosis: Asymptomatic. He has been known to have severe aortic stenosis since at least May 2013 and has done well. LV function is normal. He tolerated the left BKA in July 2013 without hemodynamic compromise. He has tolerated hemodynamic shifts with  dialysis. His AS will be followed closely. No need for intervention at  this time. Will need echo before discharge but this will not need to be done before his surgery.  3. Chronic atrial fibrillation: EKG today to document current rhythm. Recommend telemetry bed. Continue beta blocker. Coumadin on hold for upcoming surgical procedure.   4. CAD s/p CABG: Stable. No angina. Continue medical management.    Signed: Zaeden Lastinger 01/24/2012, 4:14 PM

## 2012-01-24 NOTE — Progress Notes (Signed)
[facility] PROGRESS NOTE  Alan [name] ZOX:[id number] DOB: 12-18-1931 DOA: 01/21/2012 PCP: Junious Silk, MD  Assessment/Plan: SIRS (systemic inflammatory response syndrome)  - infection of the right second toe wound with concern for gangrene.. Improved. VSS. Non-toxic appearing. WC pending this am.  contininue IV vancomycin and Unasyn dosed per pharmacy day #4.  -Patient denies any pain at this time however also is poor sensation of the distal extremities.  -Vascular evaluating for surgery which will be tomorrow if INR ok -vit k has been ordered by vascular surgery  Gangrenous right second toe  -per vascular surgery- likely amputation 1/17 -anticipate surgery when INR 1.5   CAD (coronary artery disease)  Continue with metoprolol and statin.   Diabetes mellitus  Fair control. continue sliding scale insulin. Hold oral hypoglycemic. CBG range 77-189. Continue to monitor his blood glucose closely   End-stage renal disease on dialysis  Due for his dialysis today. Appreciate nephrology . Is a tues/thurs/sat dialysis schedule   Atrial fibrillation  Patient was on Coumadin with supratherapeutic INR. INR continues to be elevated, vascular surgery has been for vitamin K. If INR is still elevated in the morning, we'll give FFP's.  Code Status: full Family Communication:  Disposition Plan: home   Consultants:  Nephrology  vascular  Procedures:  HD 01/22/12  HD 01/24/12  Antibiotics: Vancomycin 01/21/12  unasyn 01/21/11 >>>  HPI/Subjective: NAD  Objective: Filed Vitals:   01/24/12 0800 01/24/12 0830 01/24/12 0900 01/24/12 0930  BP: 117/50 121/57 124/60 124/61  Pulse: 86 89 93 90  Temp:      TempSrc:      Resp: 18 16 16 16   Height:      Weight:      SpO2:        Intake/Output Summary (Last 24 hours) at 01/24/12 0935 Last data filed at 01/24/12 0602  Gross per 24 hour  Intake    200 ml  Output      0 ml  Net    200 ml   Filed Weights   01/23/12 1406 01/24/12 0546 01/24/12 0700  Weight: 100.3 kg (221 lb 1.9 oz) 121.5 kg (267 lb 13.7 oz) 128.3 kg (282 lb 13.6 oz)    Exam:   General:  Awake alert NAD  Cardiovascular: RRR +murmur   Respiratory: normal effort BS somewhat coarse. No wheeze  Abdomen: round soft +BS  Extremities: Left BKA dressing right foot dry  Data Reviewed: Basic Metabolic Panel:  Lab 01/24/12 8119 01/23/12 0650 01/22/12 0640 01/21/12 1809  NA 135 133* 135 134*  K 4.3 4.7 3.8 4.6  CL 93* 92* 91* 89*  CO2 26 25 29 29   GLUCOSE 84 135* 105* 80  BUN 50* 34* 55* 49*  CREATININE 8.51* 6.34* 9.53* 8.51*  CALCIUM 8.6 8.7 8.4 9.0  MG -- 2.0 -- --  PHOS 4.4 -- -- --   Liver Function Tests:  Lab 01/24/12 0741 01/21/12 1809  AST -- 19  ALT -- 12  ALKPHOS -- 53  BILITOT -- 0.7  PROT -- 7.8  ALBUMIN 2.7* 3.4*   No results found for this basename: LIPASE:5,AMYLASE:5 in the last 168 hours No results found for this basename: AMMONIA:5 in the last 168 hours CBC:  Lab 01/24/12 0500 01/23/12 0650 01/22/12 0640 01/21/12 1809  WBC 12.5* 13.0* 13.0* 14.1*  NEUTROABS -- -- 10.4* 12.0*  HGB 9.7* 10.0* 9.5* 11.3*  HCT 29.3* 30.9* 28.6* 33.6*  MCV 89.3 90.4 89.4 90.1  PLT 252 229 213 206  Cardiac Enzymes: No results found for this basename: CKTOTAL:5,CKMB:5,CKMBINDEX:5,TROPONINI:5 in the last 168 hours BNP (last 3 results) No results found for this basename: PROBNP:3 in the last 8760 hours CBG:  Lab 01/24/12 0409 01/23/12 2341 01/23/12 2051 01/23/12 1653 01/23/12 1200  GLUCAP 77 123* 189* 124* 186*    Recent Results (from the past 240 hour(s))  CULTURE, BLOOD (ROUTINE X 2)     Status: Normal (Preliminary result)   Collection Time   01/22/12  6:40 AM      Component Value Range Status Comment   Specimen Description BLOOD RIGHT ARM   Final    Special Requests BOTTLES DRAWN AEROBIC AND ANAEROBIC 10CC   Final    Culture  Setup Time 01/22/2012 15:58   Final    Culture     Final    Value:         BLOOD CULTURE RECEIVED NO GROWTH TO DATE CULTURE WILL BE HELD FOR 5 DAYS BEFORE ISSUING A FINAL NEGATIVE REPORT   Report Status PENDING   Incomplete   CULTURE, BLOOD (ROUTINE X 2)     Status: Normal (Preliminary result)   Collection Time   01/22/12  6:45 AM      Component Value Range Status Comment   Specimen Description BLOOD LEFT ANTECUBITAL   Final    Special Requests BOTTLES DRAWN AEROBIC AND ANAEROBIC 10CC   Final    Culture  Setup Time 01/22/2012 15:58   Final    Culture     Final    Value:        BLOOD CULTURE RECEIVED NO GROWTH TO DATE CULTURE WILL BE HELD FOR 5 DAYS BEFORE ISSUING A FINAL NEGATIVE REPORT   Report Status PENDING   Incomplete   MRSA PCR SCREENING     Status: Normal   Collection Time   01/22/12  7:45 AM      Component Value Range Status Comment   MRSA by PCR NEGATIVE  NEGATIVE Final      Studies: Dg Chest Port 1 View  01/23/2012  *RADIOLOGY REPORT*  Clinical Data: Fever.  Increasing cough and shortness of breath.  PORTABLE CHEST - 1 VIEW  Comparison: 07/18/2011  Findings: There is chronic cardiomegaly with tortuosity of the thoracic aorta.  There is a prominent left pericardial fat pad. There are no infiltrates or effusions.  No pulmonary edema.  No acute osseous abnormality.  Evidence of prior CABG.  IMPRESSION: Chronic cardiomegaly.  No acute abnormality.   Original Report Authenticated By: Francene Boyers, M.D.     Scheduled Meds:   . allopurinol  150 mg Oral Daily  . ampicillin-sulbactam (UNASYN) IV  1.5 g Intravenous Q12H  . calcium carbonate  1 tablet Oral TID WC  . darbepoetin (ARANESP) injection - DIALYSIS  150 mcg Intravenous Q Tue-HD  . ferric gluconate (FERRLECIT/NULECIT) IV  125 mg Intravenous Q Thu-HD  . insulin aspart  0-9 Units Subcutaneous Q4H  . metoprolol tartrate  37.5 mg Oral BID  . multivitamin  1 tablet Oral QHS  . paricalcitol      . paricalcitol  4 mcg Intravenous Q T,Th,Sa-HD  . phytonadione (VITAMIN K) IV  1 mg Intravenous Once  .  simvastatin  40 mg Oral QHS  . sodium chloride  3 mL Intravenous Q12H  . vancomycin  1,000 mg Intravenous Q T,Th,Sa-HD   Continuous Infusions:   Active Problems:  Aortic stenosis  CAD (coronary artery disease)  End stage renal disease on dialysis  Diabetes mellitus  Atherosclerosis  of native arteries of the extremities with ulceration(440.23)  SIRS (systemic inflammatory response syndrome)  Wound infection  Unilateral complete BKA  Atrial fibrillation    Time spent: 30 minutes    [facility] M  [facility]  If 8PM-8AM, please contact night-coverage at [email or web address], password [facility] 01/24/2012, 9:35 AM  LOS: 3 days    Attending I've seen and examined the patient, agreed with the above-noted documentation and plan. INR is still high, vitamin K has been ordered by [facility]. Hopefully by tomorrow INR will be within range, if not will provide FFP prior to the planned procedure. Continue with empiric antibiotics. TEE on May 2013, shows severe aortic stenosis, will ask cardiology to do a preop evaluation. Continue with metoprolol.  S Tomiko Schoon

## 2012-01-24 NOTE — Procedures (Signed)
I was present at this dialysis session. I have reviewed the session itself and made appropriate changes.   Vinson Moselle, MD [employer] 01/24/2012, 11:05 AM

## 2012-01-25 ENCOUNTER — Encounter (HOSPITAL_COMMUNITY): Payer: Self-pay | Admitting: Anesthesiology

## 2012-01-25 ENCOUNTER — Inpatient Hospital Stay (HOSPITAL_COMMUNITY): Payer: MEDICARE | Admitting: Anesthesiology

## 2012-01-25 ENCOUNTER — Encounter (HOSPITAL_COMMUNITY)
Admission: EM | Disposition: A | Discharge status: Rehabilitation Facility | Payer: Self-pay | Source: Home / Self Care | Attending: Internal Medicine

## 2012-01-25 DIAGNOSIS — N186 End stage renal disease: Secondary | ICD-10-CM

## 2012-01-25 DIAGNOSIS — Z7901 Long term (current) use of anticoagulants: Secondary | ICD-10-CM

## 2012-01-25 HISTORY — PX: AMPUTATION: SHX166

## 2012-01-25 LAB — GLUCOSE, CAPILLARY
Glucose-Capillary: 125 mg/dL — ABNORMAL HIGH (ref 70–99)
Glucose-Capillary: 138 mg/dL — ABNORMAL HIGH (ref 70–99)
Glucose-Capillary: 198 mg/dL — ABNORMAL HIGH (ref 70–99)

## 2012-01-25 LAB — BASIC METABOLIC PANEL
Calcium: 8.9 mg/dL (ref 8.4–10.5)
Creatinine, Ser: 6.02 mg/dL — ABNORMAL HIGH (ref 0.50–1.35)
GFR calc non Af Amer: 8 mL/min — ABNORMAL LOW (ref >90)
Sodium: 137 mEq/L (ref 135–145)

## 2012-01-25 LAB — CBC
MCH: 28.9 pg (ref 26.0–34.0)
MCV: 91.5 fL (ref 78.0–100.0)
Platelets: 263 10*3/uL (ref 150–400)
RBC: 3.53 MIL/uL — ABNORMAL LOW (ref 4.22–5.81)
RDW: 13.1 % (ref 11.5–15.5)
WBC: 12.9 10*3/uL — ABNORMAL HIGH (ref 4.0–10.5)

## 2012-01-25 LAB — PROTIME-INR
INR: 1.54 — ABNORMAL HIGH (ref 0.00–1.49)
Prothrombin Time: 18 seconds — ABNORMAL HIGH (ref 11.6–15.2)

## 2012-01-25 SURGERY — AMPUTATION DIGIT
Anesthesia: Monitor Anesthesia Care | Site: Toe | Laterality: Right | Wound class: Dirty or Infected

## 2012-01-25 MED ORDER — SODIUM CHLORIDE 0.9 % IV SOLN
INTRAVENOUS | Status: DC | PRN
  Administered 2012-01-25: 10:00:00 via INTRAVENOUS

## 2012-01-25 MED ORDER — WARFARIN - PHARMACIST DOSING INPATIENT
Freq: Every day | Status: DC
  Administered 2012-01-26 – 2012-01-27 (×2)

## 2012-01-25 MED ORDER — FENTANYL CITRATE 0.05 MG/ML IJ SOLN
50.0000 ug | INTRAMUSCULAR | Status: DC | PRN
  Administered 2012-01-25: 50 ug via INTRAVENOUS

## 2012-01-25 MED ORDER — SODIUM CHLORIDE 0.9 % IV SOLN
INTRAVENOUS | Status: DC
  Administered 2012-01-25: 10:00:00 via INTRAVENOUS
  Administered 2012-01-27: 20 mL/h via INTRAVENOUS
  Administered 2012-01-28: 08:00:00 via INTRAVENOUS

## 2012-01-25 MED ORDER — ONDANSETRON HCL 4 MG/2ML IJ SOLN: 4.0000 mg | Freq: Once | INTRAMUSCULAR | Status: DC | PRN

## 2012-01-25 MED ORDER — HYDROMORPHONE HCL PF 1 MG/ML IJ SOLN: 0.2500 mg | INTRAMUSCULAR | Status: DC | PRN

## 2012-01-25 MED ORDER — FENTANYL CITRATE 0.05 MG/ML IJ SOLN
INTRAMUSCULAR | Status: AC
  Administered 2012-01-25: 50 ug via INTRAVENOUS
  Filled 2012-01-25: qty 2

## 2012-01-25 MED ORDER — OXYCODONE-ACETAMINOPHEN 5-325 MG PO TABS
1.0000 | ORAL_TABLET | ORAL | Status: DC | PRN
  Administered 2012-01-25 – 2012-01-26 (×2): 1 via ORAL
  Administered 2012-01-26 – 2012-01-28 (×4): 2 via ORAL
  Filled 2012-01-25: qty 1
  Filled 2012-01-25 (×5): qty 2

## 2012-01-25 MED ORDER — INSULIN ASPART 100 UNIT/ML ~~LOC~~ SOLN
0.0000 [IU] | Freq: Three times a day (TID) | SUBCUTANEOUS | Status: DC
  Administered 2012-01-25: 1 [IU] via SUBCUTANEOUS
  Administered 2012-01-26: 5 [IU] via SUBCUTANEOUS
  Administered 2012-01-26 – 2012-01-27 (×2): 2 [IU] via SUBCUTANEOUS
  Administered 2012-01-27 (×2): 1 [IU] via SUBCUTANEOUS
  Administered 2012-01-28: 2 [IU] via SUBCUTANEOUS

## 2012-01-25 MED ORDER — PROPOFOL INFUSION 10 MG/ML OPTIME
INTRAVENOUS | Status: DC | PRN
  Administered 2012-01-25: 50 ug/kg/min via INTRAVENOUS

## 2012-01-25 MED ORDER — HEPARIN (PORCINE) IN NACL 100-0.45 UNIT/ML-% IJ SOLN
1250.0000 [IU]/h | INTRAMUSCULAR | Status: DC
  Administered 2012-01-25: 1250 [IU]/h via INTRAVENOUS
  Filled 2012-01-25 (×2): qty 250

## 2012-01-25 MED ORDER — 0.9 % SODIUM CHLORIDE (POUR BTL) OPTIME
TOPICAL | Status: DC | PRN
  Administered 2012-01-25: 1000 mL

## 2012-01-25 MED ORDER — ACETAMINOPHEN 10 MG/ML IV SOLN
1000.0000 mg | Freq: Once | INTRAVENOUS | Status: DC | PRN
  Filled 2012-01-25: qty 100

## 2012-01-25 SURGICAL SUPPLY — 34 items
BANDAGE CONFORM 3  STR LF (GAUZE/BANDAGES/DRESSINGS) ×2 IMPLANT
BANDAGE ELASTIC 4 VELCRO ST LF (GAUZE/BANDAGES/DRESSINGS) ×2 IMPLANT
BANDAGE GAUZE ELAST BULKY 4 IN (GAUZE/BANDAGES/DRESSINGS) ×2 IMPLANT
BLADE AVERAGE 25X9 (BLADE) IMPLANT
CANISTER SUCTION 2500CC (MISCELLANEOUS) ×2 IMPLANT
CLOTH BEACON ORANGE TIMEOUT ST (SAFETY) ×2 IMPLANT
COVER SURGICAL LIGHT HANDLE (MISCELLANEOUS) ×2 IMPLANT
DRAPE EXTREMITY T 121X128X90 (DRAPE) ×3 IMPLANT
ELECT REM PT RETURN 9FT ADLT (ELECTROSURGICAL) ×2
ELECTRODE REM PT RTRN 9FT ADLT (ELECTROSURGICAL) ×1 IMPLANT
GLOVE BIO SURGEON STRL SZ 6.5 (GLOVE) ×2 IMPLANT
GLOVE BIO SURGEON STRL SZ7.5 (GLOVE) ×2 IMPLANT
GLOVE BIOGEL PI IND STRL 6.5 (GLOVE) IMPLANT
GLOVE BIOGEL PI IND STRL 8 (GLOVE) ×1 IMPLANT
GLOVE BIOGEL PI INDICATOR 6.5 (GLOVE) ×1
GLOVE BIOGEL PI INDICATOR 8 (GLOVE) ×1
GLOVE ECLIPSE 6.5 STRL STRAW (GLOVE) ×1 IMPLANT
GLOVE SS BIOGEL STRL SZ 6.5 (GLOVE) IMPLANT
GLOVE SUPERSENSE BIOGEL SZ 6.5 (GLOVE) ×1
GOWN STRL NON-REIN LRG LVL3 (GOWN DISPOSABLE) ×4 IMPLANT
KIT BASIN OR (CUSTOM PROCEDURE TRAY) ×2 IMPLANT
KIT ROOM TURNOVER OR (KITS) ×2 IMPLANT
NS IRRIG 1000ML POUR BTL (IV SOLUTION) ×2 IMPLANT
PACK GENERAL/GYN (CUSTOM PROCEDURE TRAY) ×2 IMPLANT
PAD ARMBOARD 7.5X6 YLW CONV (MISCELLANEOUS) ×4 IMPLANT
SPECIMEN JAR SMALL (MISCELLANEOUS) ×2 IMPLANT
SPONGE GAUZE 4X4 12PLY (GAUZE/BANDAGES/DRESSINGS) ×2 IMPLANT
SUT ETHILON 3 0 PS 1 (SUTURE) ×2 IMPLANT
SWAB COLLECTION DEVICE MRSA (MISCELLANEOUS) ×1 IMPLANT
TOWEL OR 17X24 6PK STRL BLUE (TOWEL DISPOSABLE) ×2 IMPLANT
TOWEL OR 17X26 10 PK STRL BLUE (TOWEL DISPOSABLE) ×2 IMPLANT
TUBE ANAEROBIC SPECIMEN COL (MISCELLANEOUS) IMPLANT
UNDERPAD 30X30 INCONTINENT (UNDERPADS AND DIAPERS) ×2 IMPLANT
WATER STERILE IRR 1000ML POUR (IV SOLUTION) ×2 IMPLANT

## 2012-01-25 NOTE — Progress Notes (Signed)
ANTICOAGULATION CONSULT NOTE - Initial Consult  Pharmacy Consult for Heparin Indication: atrial fibrillation  Allergies  Allergen Reactions  . Ancef (Cefazolin) Rash    Patient Measurements: Height: 6' (182.9 cm) Weight: 277 lb 12.5 oz (126 kg) IBW/kg (Calculated) : 77.6  Heparin Dosing Weight: 106 kg  Vital Signs: Temp: 98.6 F (37 C) (01/17 1423) Temp src: Oral (01/17 1423) BP: 128/82 mmHg (01/17 1423) Pulse Rate: 86  (01/17 1423)  Labs:  Basename 01/25/12 0445 01/24/12 0741 01/24/12 0500 01/23/12 0650  HGB 10.2* -- 9.7* --  HCT 32.3* -- 29.3* 30.9*  PLT 263 -- 252 229  APTT -- -- -- --  LABPROT 18.0* -- 29.0* 31.6*  INR 1.54* -- 2.92* 3.28*  HEPARINUNFRC -- -- -- --  CREATININE 6.02* 8.51* -- 6.34*  CKTOTAL -- -- -- --  CKMB -- -- -- --  TROPONINI -- -- -- --    Estimated Creatinine Clearance: 13.9 ml/min (by C-G formula based on Cr of 6.02).   Medical History: Past Medical History  Diagnosis Date  . Diabetes mellitus   . Gout   . Hypertension   . Hyperlipidemia   . Chronic kidney disease     Started dialysis October 2012  . Arthritis   . Prostate cancer     s/p seed implant  . Cataract     BILATERAL-BEEN REMOVED  . Neuromuscular disorder     DABETIC NEUROPATHY-LOWER EXTREMITY  . Atrial fibrillation     on coumadin  . Aortic stenosis     moderate to severe, not felt to be a surgical candidate  . Anemia     secondary to end stage renal disease  . Coronary artery disease     remote CABG in 1996; last stress test in 2005; last cath 2005  . Diabetic neuropathy   . Edema   . PAD (peripheral artery disease)   . Diabetic retinopathy(362.0)   . Chronic anticoagulation     on coumadin  . Nephrolithiasis     prior history of percutaneous nephrostomy  . Shortness of breath     Medications:  Scheduled:    . allopurinol  150 mg Oral Daily  . ampicillin-sulbactam (UNASYN) IV  1.5 g Intravenous Q12H  . calcium carbonate  1 tablet Oral TID WC  .  darbepoetin (ARANESP) injection - DIALYSIS  150 mcg Intravenous Q Tue-HD  . ferric gluconate (FERRLECIT/NULECIT) IV  125 mg Intravenous Q Thu-HD  . insulin aspart  0-9 Units Subcutaneous TID WC  . metoprolol tartrate  37.5 mg Oral BID  . multivitamin  1 tablet Oral QHS  . [EXPIRED] paricalcitol      . paricalcitol  4 mcg Intravenous Q T,Th,Sa-HD  . simvastatin  40 mg Oral QHS  . sodium chloride  3 mL Intravenous Q12H  . vancomycin  1,000 mg Intravenous Q T,Th,Sa-HD  . [DISCONTINUED] insulin aspart  0-9 Units Subcutaneous Q4H    Assessment: 79 year old male s/p toe amputation today.  He is on chronic anticoagulation with Coumadin for atrial fibrillation which has been held and partially reversed with Vitamin K for surgery.  Request received to start Heparin bridging 6 hours post-surgery and resume Coumadin on 1/18.  His surgery ended around 12noon today.  Goal of Therapy:  Heparin level 0.3-0.7 units/ml Monitor platelets by anticoagulation protocol: Yes   Plan:  No Heparin bolus due to today's surgery Start Heparin infusion at 1250 units/hr (~12 units/kg/hr) at 1800 Check Heparin level and CBC in 8 hours and  daily Check PT/INR with AM labs and restart Coumadin 1/18.  Estella Husk, Pharm.D., BCPS Clinical Pharmacist  Phone [phone] Pager [phone] 01/25/2012, 4:46 PM

## 2012-01-25 NOTE — Anesthesia Procedure Notes (Addendum)
Anesthesia Regional Block:  Ankle block  Pre-Anesthetic Checklist: ,, timeout performed, Correct Patient, Correct Site, Correct Laterality, Correct Procedure, Correct Position, site marked, Risks and benefits discussed,  Surgical consent,  Pre-op evaluation,  At surgeon's request and post-op pain management  Laterality: Right  Prep: chloraprep       Needles:       Needle Gauge: 22 and 22 G    Additional Needles: Ankle block Narrative:  Start time: 01/25/2012 10:40 AM End time: 01/25/2012 10:57 AM Injection made incrementally with aspirations every 5 mL.  Performed by: Personally   Additional Notes: 30 cc 2.0% lidocaine plain injected

## 2012-01-25 NOTE — H&P (View-Only) (Signed)
[facility]  Daily Progress Note  Assessment/Planning: R foot gangrene   Check INR today, if therapeutic reverse with FFP/Vitamin K  Scheduled for R 2nd toe amputation tomorrow if INR ok  Subjective    Pain ok  Objective Filed Vitals:   01/23/12 0504 01/23/12 1406 01/23/12 2114 01/24/12 0546  BP: 102/63  135/77 112/64  Pulse: 62  92 85  Temp: 99.5 F (37.5 C)  100.1 F (37.8 C) 97.4 F (36.3 C)  TempSrc: Oral  Oral Oral  Resp: 20  18 20  Height:      Weight: 268 lb 4.8 oz (121.7 kg) 221 lb 1.9 oz (100.3 kg)  267 lb 13.7 oz (121.5 kg)  SpO2: 94%  96% 94%    Intake/Output Summary (Last 24 hours) at 01/24/12 0625 Last data filed at 01/24/12 0602  Gross per 24 hour  Intake    200 ml  Output      0 ml  Net    200 ml    PULM  CTAB CV  RRR GI  soft, NTND VASC  R foot: 2nd toe dead, lateral malleolus ulcer: partial thickness  Laboratory CBC    Component Value Date/Time   WBC 13.0* 01/23/2012 0650   HGB 10.0* 01/23/2012 0650   HCT 30.9* 01/23/2012 0650   PLT 229 01/23/2012 0650    BMET    Component Value Date/Time   NA 133* 01/23/2012 0650   K 4.7 01/23/2012 0650   CL 92* 01/23/2012 0650   CO2 25 01/23/2012 0650   GLUCOSE 135* 01/23/2012 0650   BUN 34* 01/23/2012 0650   CREATININE 6.34* 01/23/2012 0650   CALCIUM 8.7 01/23/2012 0650   CALCIUM 8.8 10/27/2010 0734   GFRNONAA 7* 01/23/2012 0650   GFRAA 9* 01/23/2012 0650   Lab Results  Component Value Date   INR 3.28* 01/23/2012   INR 5.27* 01/22/2012   INR 4.18* 01/21/2012    Khalee Mazo, MD [facility] Office: [phone] Pager: [phone]  01/24/2012, 6:25 AM     

## 2012-01-25 NOTE — Transfer of Care (Signed)
Immediate Anesthesia Transfer of Care Note  Patient: Alan [name]  Procedure(s) Performed: Procedure(s) (LRB) with comments: AMPUTATION DIGIT (Right) - second  Patient Location: PACU  Anesthesia Type:MAC and Regional  Level of Consciousness: awake, alert , oriented and patient cooperative  Airway & Oxygen Therapy: Patient Spontanous Breathing and Patient connected to nasal cannula oxygen  Post-op Assessment: Report given to PACU RN, Post -op Vital signs reviewed and stable and Patient moving all extremities X 4  Post vital signs: Reviewed and stable  Complications: No apparent anesthesia complications

## 2012-01-25 NOTE — Progress Notes (Signed)
Subjective:  S/p toe amp today Objective Vital signs in last 24 hours: Filed Vitals:   01/25/12 1145 01/25/12 1200 01/25/12 1253 01/25/12 1423  BP: 110/58 127/82 143/81 128/82  Pulse: 84 83 84 86  Temp:  97.6 F (36.4 C)  98.6 F (37 C)  TempSrc:    Oral  Resp: 19 20    Height:      Weight:      SpO2: 100% 93%  100%   Weight change: 25.7 kg (56 lb 10.5 oz)  Intake/Output Summary (Last 24 hours) at 01/25/12 1607 Last data filed at 01/25/12 1253  Gross per 24 hour  Intake      3 ml  Output    300 ml  Net   -297 ml   Labs: Basic Metabolic Panel:  Lab 01/25/12 1610 01/24/12 0741 01/23/12 0650  NA 137 135 133*  K 4.0 4.3 4.7  CL 95* 93* 92*  CO2 29 26 25   GLUCOSE 117* 84 135*  BUN 31* 50* 34*  CREATININE 6.02* 8.51* 6.34*  CALCIUM 8.9 8.6 8.7  ALB -- -- --  PHOS -- 4.4 --   Liver Function Tests:  Lab 01/24/12 0741 01/21/12 1809  AST -- 19  ALT -- 12  ALKPHOS -- 53  BILITOT -- 0.7  PROT -- 7.8  ALBUMIN 2.7* 3.4*   No results found for this basename: LIPASE:3,AMYLASE:3 in the last 168 hours No results found for this basename: AMMONIA:3 in the last 168 hours CBC:  Lab 01/25/12 0445 01/24/12 0500 01/23/12 0650 01/22/12 0640 01/21/12 1809  WBC 12.9* 12.5* 13.0* -- --  NEUTROABS -- -- -- 10.4* 12.0*  HGB 10.2* 9.7* 10.0* -- --  HCT 32.3* 29.3* 30.9* -- --  MCV 91.5 89.3 90.4 89.4 90.1  PLT 263 252 229 -- --   Cardiac Enzymes: No results found for this basename: CKTOTAL:5,CKMB:5,CKMBINDEX:5,TROPONINI:5 in the last 168 hours CBG:  Lab 01/25/12 1136 01/25/12 0956 01/25/12 0749 01/25/12 0441 01/25/12 0038  GLUCAP 138* 125* 114* 111* 122*    Iron Studies: No results found for this basename: IRON,TIBC,TRANSFERRIN,FERRITIN in the last 72 hours Studies/Results: No results found. Medications:    . sodium chloride 20 mL/hr at 01/25/12 1005      . allopurinol  150 mg Oral Daily  . ampicillin-sulbactam (UNASYN) IV  1.5 g Intravenous Q12H  . calcium carbonate   1 tablet Oral TID WC  . darbepoetin (ARANESP) injection - DIALYSIS  150 mcg Intravenous Q Tue-HD  . ferric gluconate (FERRLECIT/NULECIT) IV  125 mg Intravenous Q Thu-HD  . insulin aspart  0-9 Units Subcutaneous TID WC  . metoprolol tartrate  37.5 mg Oral BID  . multivitamin  1 tablet Oral QHS  . paricalcitol  4 mcg Intravenous Q T,Th,Sa-HD  . simvastatin  40 mg Oral QHS  . sodium chloride  3 mL Intravenous Q12H  . vancomycin  1,000 mg Intravenous Q T,Th,Sa-HD   I  have reviewed scheduled and prn medications.  Physical Exam:  Gen: groggy Heart: Irreg , irreg, 2/6 murmur at apex   A. Fib 92 rate on tele Lungs: CTA bilat  Abdomen: Obese, soft, nontender  Extremities: Left BKA, Right lower leg with 1+ edema, foot wrapped Dialysis Access:  Patent on hd. R U A AVF   Dialysis Orders: Center: [facility] on TTS .  EDW 106 kg HD Bath 2.0 k, 2.25 ca Time 4 hrs 15 min Heparin 3,400units. Access Right upper arm avf BFR 500 DFR 800 Hectoral 4  mcg IV/HD Epogen 1000 on tue and sat IV/HD Venofer 100mg  q weekly hd  Other 0   Assessment/Plan  1. S/P ray amp R 2nd toe (1/17)- per surg 2. ESRD, cont hd TTS. HD tomorrow, no heparin 3. HTN/volume- on metoprolol only. Weights are inaccurate. No gross vol excess. UF 3-4 kg with HD tomorrow 4. Anemia - increased ESA to darbe 100/wk, cont maint IV Fe 5. Metabolic bone disease - Ca/phos within range, cont CaCO3 as binder 6. Atrial Fib with elevated INR= Pharmacy dosing . INR down 1.54 7. CAD Sp CAGB 1996/ Ho mod. Aortic Stenosis not surgical candidate  8. DM Type 2 = per admit team   Vinson Moselle  MD [facility] [phone] pgr    [phone] cell 01/25/2012, 4:07 PM

## 2012-01-25 NOTE — Progress Notes (Signed)
   TELEMETRY: Reviewed telemetry pt in atrial flutter with controlled rate: Filed Vitals:   01/25/12 1200 01/25/12 1253 01/25/12 1423 01/25/12 1713  BP: 127/82 143/81 128/82 133/74  Pulse: 83 84 86 83  Temp: 97.6 F (36.4 C)  98.6 F (37 C) 97.9 F (36.6 C)  TempSrc:   Oral Oral  Resp: 20   18  Height:      Weight:    230 lb 13.2 oz (104.7 kg)  SpO2: 93%  100% 96%    Intake/Output Summary (Last 24 hours) at 01/25/12 1854 Last data filed at 01/25/12 1700  Gross per 24 hour  Intake    243 ml  Output    300 ml  Net    -57 ml    SUBJECTIVE Patient seen post op. Denies any chest pain or dyspnea.  LABS: Basic Metabolic Panel:  Basename 01/25/12 0445 01/24/12 0741 01/23/12 0650  NA 137 135 --  K 4.0 4.3 --  CL 95* 93* --  CO2 29 26 --  GLUCOSE 117* 84 --  BUN 31* 50* --  CREATININE 6.02* 8.51* --  CALCIUM 8.9 8.6 --  MG -- -- 2.0  PHOS -- 4.4 --   Liver Function Tests:  Basename 01/24/12 0741  AST --  ALT --  ALKPHOS --  BILITOT --  PROT --  ALBUMIN 2.7*   No results found for this basename: LIPASE:2,AMYLASE:2 in the last 72 hours CBC:  Basename 01/25/12 0445 01/24/12 0500  WBC 12.9* 12.5*  NEUTROABS -- --  HGB 10.2* 9.7*  HCT 32.3* 29.3*  MCV 91.5 89.3  PLT 263 252     Radiology/Studies:  Dg Chest Port 1 View  01/23/2012  *RADIOLOGY REPORT*  Clinical Data: Fever.  Increasing cough and shortness of breath.  PORTABLE CHEST - 1 VIEW  Comparison: 07/18/2011  Findings: There is chronic cardiomegaly with tortuosity of the thoracic aorta.  There is a prominent left pericardial fat pad. There are no infiltrates or effusions.  No pulmonary edema.  No acute osseous abnormality.  Evidence of prior CABG.  IMPRESSION: Chronic cardiomegaly.  No acute abnormality.   Original Report Authenticated By: Francene Boyers, M.D.     PHYSICAL EXAM General: Well developed, elderly, in no acute distress. Head: Normal Neck: Negative for carotid bruits. JVD not elevated. Lungs:  Clear bilaterally to auscultation without wheezes, rales, or rhonchi. Breathing is unlabored. Heart: IRRR S1 S2 with grade 2-3/6 systolic murmur of AS  Abdomen: Soft, non-tender, non-distended with normoactive bowel sounds. No hepatomegaly. No rebound/guarding. No obvious abdominal masses. Extremities: left BKA, 1+ edema on right. Right foot wrapped. Neuro: Alert and oriented X 3. Moves all extremities spontaneously. Psych:  Responds to questions appropriately with a normal affect.  ASSESSMENT AND PLAN: 1. S/p right 2nd toe amputation 2. Moderate to severe aortic stenosis- asymptomatic. We will obtain follow up Echo this admission. Patient is a poor surgical candidate. 3. ESRD on dialysis 4. Chronic anemia 5. Atrial fibrillation/flutter- rate well controlled. Resume coumadin per VVS. 6. CAD s/p CABG   Principal Problem:  *SIRS (systemic inflammatory response syndrome) Active Problems:  Aortic stenosis  CAD (coronary artery disease)  End stage renal disease on dialysis  Diabetes mellitus  Atherosclerosis of native arteries of the extremities with ulceration(440.23)  Wound infection  Unilateral complete BKA  Atrial fibrillation  Pre-operative cardiovascular examination    Signed, Peter [place] MD,FACC 01/25/2012 6:59 PM

## 2012-01-25 NOTE — Op Note (Signed)
NAME: Alan [name]   MRN: [id number] DOB: 08/13/1931    DATE OF OPERATION: 01/25/2012  PREOP DIAGNOSIS: gangrene of the right second toe  POSTOP DIAGNOSIS: same  PROCEDURE: Open ray amputation of right second toe  SURGEON: Di Kindle. Edilia Bo, MD, FACS  ASSIST: none  ANESTHESIA: ankle block   EBL: minimal  INDICATIONS: Alan [name] is a 79 y.o. male with unreconstructable peripheral vascular disease of the right lower extremity. He has gangrene of the right second toe and presented with sepsis. I was asked to proceed with right second toe amputation and rule out a deep space infection.  FINDINGS: the tissue appeared poorly perfused. There was no gross purulence. Intraoperative culture was sent.  TECHNIQUE: the patient was taken to the operating room after an ankle block was placed by anesthesia. The right foot was prepped and draped in usual sterile fashion. An incision was made encompassing the right second toe and the bone was divided using a bone cutter. Bone was divided at the tarsal level. There was no gross purulence however the tissue appeared marginally perfused. Devitalized tissue was appreciated. There was minimal bleeding. The wound was then irrigated and then the wound packed with a moist 4 x 4 with normal saline. Sterile dressing was applied. Patient tolerated the procedure well and was transferred to the recovery room in stable condition. All needle and sponge counts were correct.   Waverly Ferrari, MD, FACS [facility]  DATE OF DICTATION:   01/25/2012

## 2012-01-25 NOTE — Progress Notes (Signed)
[facility] PROGRESS NOTE  Alan [name] NWG:[id number] DOB: 30-Dec-1931 DOA: 01/21/2012 PCP: Junious Silk, MD  Assessment/Plan: SIRS (systemic inflammatory response syndrome)  -resolved 01/25/12 - infection of the right second toe wound with concern for gangrene.. Stable.  VSS. Non-toxic appearing. WC stable at 12. contininue IV vancomycin and Unasyn dosed per pharmacy day #5.  -Patient denies any pain at this time however also is poor sensation of the distal extremities.    Gangrenous right second toe  -per vascular surgery- amputation 1/17  - Spoke with Dr. Edilia Bo today, okay to discontinue antibiotics for 48 hours post surgery.  CAD (coronary artery disease)  Continue with metoprolol and statin.  Appreciate cardiology consult  Severe aortic stenosis - Per cardiology  Diabetes mellitus  Fair control. continue sliding scale insulin. Hold oral hypoglycemic. CBG range 111-170. Continue to monitor his blood glucose closely   End-stage renal disease on dialysis  Due for his dialysis 01/26/12. Appreciate nephrology . Is a tues/thurs/sat dialysis schedule   Atrial fibrillation  Patient was on Coumadin with supratherapeutic INR. - Continue with metoprolol  INR 1.54 this a.m. s/p vitamin K.  - Spoke with Dr. Edilia Bo, okay to resume heparin drip without bolus 6 hours post op, okay to resume Coumadin from tomorrow.  Anemia  - From end-stage renal - Per nephrology  Code Status: full Family Communication:  Disposition Plan: may need facility at discharge   Consultants:  Cardiology  Nephrology  Vascular  Procedures: HD 01/22/12  HD 01/24/12   Antibiotics: Vancomycin 01/21/12 unasyn 01/21/11 >>>   HPI/Subjective: Awake in bed. Denies pain/discomfort.   Objective: Filed Vitals:   01/24/12 1328 01/24/12 1336 01/24/12 2130 01/25/12 0551  BP: 126/74 126/74 139/80 139/71  Pulse: 129 129 88 89  Temp:  98.6 F (37 C) 100.3 F (37.9 C) 99.2 F (37.3 C)    TempSrc:  Oral Oral Oral  Resp:  20 20 20   Height:      Weight:      SpO2:  98% 96% 94%    Intake/Output Summary (Last 24 hours) at 01/25/12 0947 Last data filed at 01/24/12 1600  Gross per 24 hour  Intake    370 ml  Output   2500 ml  Net  -2130 ml   Filed Weights   01/24/12 0546 01/24/12 0700 01/24/12 1130  Weight: 121.5 kg (267 lb 13.7 oz) 128.3 kg (282 lb 13.6 oz) 126 kg (277 lb 12.5 oz)    Exam:   General:  Awake alert NAD  Cardiovascular: RRR +murmur  Respiratory: normal effort BS remain coarse. No wheeze  Abdomen: soft +BS non-tender to palpation  Extremities: left BKA. Right foot dressing right foot dry  Data Reviewed: Basic Metabolic Panel:  Lab 01/25/12 5784 01/24/12 0741 01/23/12 0650 01/22/12 0640 01/21/12 1809  NA 137 135 133* 135 134*  K 4.0 4.3 4.7 3.8 4.6  CL 95* 93* 92* 91* 89*  CO2 29 26 25 29 29   GLUCOSE 117* 84 135* 105* 80  BUN 31* 50* 34* 55* 49*  CREATININE 6.02* 8.51* 6.34* 9.53* 8.51*  CALCIUM 8.9 8.6 8.7 8.4 9.0  MG -- -- 2.0 -- --  PHOS -- 4.4 -- -- --   Liver Function Tests:  Lab 01/24/12 0741 01/21/12 1809  AST -- 19  ALT -- 12  ALKPHOS -- 53  BILITOT -- 0.7  PROT -- 7.8  ALBUMIN 2.7* 3.4*   No results found for this basename: LIPASE:5,AMYLASE:5 in the last 168 hours No results found  for this basename: AMMONIA:5 in the last 168 hours CBC:  Lab 01/25/12 0445 01/24/12 0500 01/23/12 0650 01/22/12 0640 01/21/12 1809  WBC 12.9* 12.5* 13.0* 13.0* 14.1*  NEUTROABS -- -- -- 10.4* 12.0*  HGB 10.2* 9.7* 10.0* 9.5* 11.3*  HCT 32.3* 29.3* 30.9* 28.6* 33.6*  MCV 91.5 89.3 90.4 89.4 90.1  PLT 263 252 229 213 206   Cardiac Enzymes: No results found for this basename: CKTOTAL:5,CKMB:5,CKMBINDEX:5,TROPONINI:5 in the last 168 hours BNP (last 3 results) No results found for this basename: PROBNP:3 in the last 8760 hours CBG:  Lab 01/25/12 0749 01/25/12 0441 01/25/12 0038 01/24/12 1925 01/24/12 1618  GLUCAP 114* 111* 122* 170* 154*     Recent Results (from the past 240 hour(s))  CULTURE, BLOOD (ROUTINE X 2)     Status: Normal (Preliminary result)   Collection Time   01/22/12  6:40 AM      Component Value Range Status Comment   Specimen Description BLOOD RIGHT ARM   Final    Special Requests BOTTLES DRAWN AEROBIC AND ANAEROBIC 10CC   Final    Culture  Setup Time 01/22/2012 15:58   Final    Culture     Final    Value:        BLOOD CULTURE RECEIVED NO GROWTH TO DATE CULTURE WILL BE HELD FOR 5 DAYS BEFORE ISSUING A FINAL NEGATIVE REPORT   Report Status PENDING   Incomplete   CULTURE, BLOOD (ROUTINE X 2)     Status: Normal (Preliminary result)   Collection Time   01/22/12  6:45 AM      Component Value Range Status Comment   Specimen Description BLOOD LEFT ANTECUBITAL   Final    Special Requests BOTTLES DRAWN AEROBIC AND ANAEROBIC 10CC   Final    Culture  Setup Time 01/22/2012 15:58   Final    Culture     Final    Value:        BLOOD CULTURE RECEIVED NO GROWTH TO DATE CULTURE WILL BE HELD FOR 5 DAYS BEFORE ISSUING A FINAL NEGATIVE REPORT   Report Status PENDING   Incomplete   MRSA PCR SCREENING     Status: Normal   Collection Time   01/22/12  7:45 AM      Component Value Range Status Comment   MRSA by PCR NEGATIVE  NEGATIVE Final   SURGICAL PCR SCREEN     Status: Normal   Collection Time   01/24/12  4:26 PM      Component Value Range Status Comment   MRSA, PCR NEGATIVE  NEGATIVE Final    Staphylococcus aureus NEGATIVE  NEGATIVE Final      Studies: Dg Chest Port 1 View  01/23/2012  *RADIOLOGY REPORT*  Clinical Data: Fever.  Increasing cough and shortness of breath.  PORTABLE CHEST - 1 VIEW  Comparison: 07/18/2011  Findings: There is chronic cardiomegaly with tortuosity of the thoracic aorta.  There is a prominent left pericardial fat pad. There are no infiltrates or effusions.  No pulmonary edema.  No acute osseous abnormality.  Evidence of prior CABG.  IMPRESSION: Chronic cardiomegaly.  No acute abnormality.    Original Report Authenticated By: Francene Boyers, M.D.     Scheduled Meds:   . allopurinol  150 mg Oral Daily  . ampicillin-sulbactam (UNASYN) IV  1.5 g Intravenous Q12H  . calcium carbonate  1 tablet Oral TID WC  . darbepoetin (ARANESP) injection - DIALYSIS  150 mcg Intravenous Q Tue-HD  . ferric gluconate (FERRLECIT/NULECIT)  IV  125 mg Intravenous Q Thu-HD  . insulin aspart  0-9 Units Subcutaneous Q4H  . metoprolol tartrate  37.5 mg Oral BID  . multivitamin  1 tablet Oral QHS  . paricalcitol  4 mcg Intravenous Q T,Th,Sa-HD  . simvastatin  40 mg Oral QHS  . sodium chloride  3 mL Intravenous Q12H  . vancomycin  1,000 mg Intravenous Q T,Th,Sa-HD   Continuous Infusions:   Principal Problem:  *SIRS (systemic inflammatory response syndrome) Active Problems:  Aortic stenosis  CAD (coronary artery disease)  End stage renal disease on dialysis  Diabetes mellitus  Atherosclerosis of native arteries of the extremities with ulceration(440.23)  Wound infection  Unilateral complete BKA  Atrial fibrillation  Pre-operative cardiovascular examination    Time spent: 30 minutes    [facility] M  [facility]  If 8PM-8AM, please contact night-coverage at [email or web address], password [facility] 01/25/2012, 9:47 AM  LOS: 4 days    Attending I have seen and examined the patient, I agree with the above assessment and plan. He is status post first ray amputation. Continue with IV antibiotics for now. Spoke with Dr. Edilia Bo, okay to start Coumadin from 1/18, okay to start heparin drip without a bolus 6 hours from surgery. Okay to discontinue antibiotics 48 hours post surgery.  S Cleone Hulick

## 2012-01-25 NOTE — Interval H&P Note (Signed)
History and Physical Interval Note:  01/25/2012 10:17 AM  Alan [name]  has presented today for surgery, with the diagnosis of gangrene toe  The various methods of treatment have been discussed with the patient and family. After consideration of risks, benefits and other options for treatment, the patient has consented to  Procedure(s) (LRB) with comments: AMPUTATION DIGIT (Right) - second as a surgical intervention .  The patient's history has been reviewed, patient examined, no change in status, stable for surgery.  I have reviewed the patient's chart and labs.  Questions were answered to the patient's satisfaction.     Ladale Sherburn S

## 2012-01-25 NOTE — Preoperative (Signed)
Beta Blockers   Reason not to administer Beta Blockers:Not Applicable, Pt takes Metoprolol BID. Last dose 01-24-12 @2131 

## 2012-01-25 NOTE — Progress Notes (Signed)
Dentures given to patient's wife

## 2012-01-25 NOTE — Progress Notes (Signed)
PT Cancellation Note  Patient Details Name: Alan [name] MRN: [id number] DOB: May 21, 1931   Cancelled Treatment:    Reason Eval/Treat Not Completed: Patient at procedure or test/unavailable (pt in OR)   [name] 01/25/2012, 10:01 AM

## 2012-01-25 NOTE — Progress Notes (Signed)
Orthopedic Tech Progress Note Patient Details:  Alan [name] February 10, 1931 [id number]  Ortho Devices Type of Ortho Device: Postop shoe/boot Ortho Device/Splint Location: (R) LE Ortho Device/Splint Interventions: Application   Jennye Moccasin 01/25/2012, 3:45 PM

## 2012-01-25 NOTE — Anesthesia Preprocedure Evaluation (Addendum)
Anesthesia Evaluation  Patient identified by MRN, date of birth, ID band Patient awake    Reviewed: Allergy & Precautions, H&P , NPO status , Patient's Chart, lab work & pertinent test results  Airway Mallampati: II TM Distance: >3 FB Neck ROM: Full    Dental  (+) Edentulous Upper, Partial Lower and Dental Advisory Given   Pulmonary  breath sounds clear to auscultation        Cardiovascular hypertension, Pt. on medications and Pt. on home beta blockers + CAD and + Peripheral Vascular Disease + dysrhythmias Atrial Fibrillation Rhythm:Irregular Rate:Normal     Neuro/Psych  Neuromuscular disease    GI/Hepatic   Endo/Other  diabetes, Type 2  Renal/GU ESRF and DialysisRenal disease     Musculoskeletal   Abdominal   Peds  Hematology   Anesthesia Other Findings   Reproductive/Obstetrics                          Anesthesia Physical Anesthesia Plan  ASA: III  Anesthesia Plan: MAC and Regional   Post-op Pain Management:    Induction: Intravenous  Airway Management Planned: Mask  Additional Equipment:   Intra-op Plan:   Post-operative Plan:   Informed Consent: I have reviewed the patients History and Physical, chart, labs and discussed the procedure including the risks, benefits and alternatives for the proposed anesthesia with the patient or authorized representative who has indicated his/her understanding and acceptance.   Dental advisory given  Plan Discussed with: CRNA, Surgeon and Anesthesiologist  Anesthesia Plan Comments: (PVD gangene R. Foot, S/P L. BKA ESRD last HD 01/24/12 K- 4.0 Chronic Afib on coumadin INR 1.54 Aortic stenosis mean gradient 42 mm hg AVA 0.6 by TEE 05/09/11 EF 55% CAD S/P CABG 1996 Type 2 DM glucose 125 Gout  Plan Ankle block with MAC  Kipp Brood, MD)    Anesthesia Quick Evaluation

## 2012-01-25 NOTE — Anesthesia Postprocedure Evaluation (Signed)
  Anesthesia Post-op Note  Patient: Alan [name]  Procedure(s) Performed: Procedure(s) (LRB) with comments: AMPUTATION DIGIT (Right) - second  Patient Location: PACU  Anesthesia Type:General  Level of Consciousness: awake, alert  and oriented  Airway and Oxygen Therapy: Patient Spontanous Breathing and Patient connected to nasal cannula oxygen  Post-op Pain: none  Post-op Assessment: Post-op Vital signs reviewed and Patient's Cardiovascular Status Stable  Post-op Vital Signs: stable  Complications: No apparent anesthesia complications

## 2012-01-26 LAB — RENAL FUNCTION PANEL
Albumin: 2.4 g/dL — ABNORMAL LOW (ref 3.5–5.2)
Calcium: 8.7 mg/dL (ref 8.4–10.5)
GFR calc Af Amer: 6 mL/min — ABNORMAL LOW (ref >90)
Phosphorus: 4 mg/dL (ref 2.3–4.6)
Potassium: 4.1 mEq/L (ref 3.5–5.1)
Sodium: 133 mEq/L — ABNORMAL LOW (ref 135–145)

## 2012-01-26 LAB — CBC
HCT: 27.1 % — ABNORMAL LOW (ref 39.0–52.0)
Hemoglobin: 8.9 g/dL — ABNORMAL LOW (ref 13.0–17.0)
MCH: 30.1 pg (ref 26.0–34.0)
MCHC: 32.8 g/dL (ref 30.0–36.0)
MCV: 91.6 fL (ref 78.0–100.0)

## 2012-01-26 LAB — PROTIME-INR
INR: 1.4 (ref 0.00–1.49)
Prothrombin Time: 16.8 seconds — ABNORMAL HIGH (ref 11.6–15.2)

## 2012-01-26 LAB — VANCOMYCIN, RANDOM: Vancomycin Rm: 24.4 ug/mL

## 2012-01-26 LAB — HEPARIN LEVEL (UNFRACTIONATED): Heparin Unfractionated: 0.2 IU/mL — ABNORMAL LOW (ref 0.30–0.70)

## 2012-01-26 MED ORDER — WARFARIN SODIUM 5 MG PO TABS
5.0000 mg | ORAL_TABLET | Freq: Once | ORAL | Status: AC
  Administered 2012-01-26: 5 mg via ORAL
  Filled 2012-01-26: qty 1

## 2012-01-26 MED ORDER — HEPARIN (PORCINE) IN NACL 100-0.45 UNIT/ML-% IJ SOLN
1700.0000 [IU]/h | INTRAMUSCULAR | Status: DC
  Administered 2012-01-26: 1500 [IU]/h via INTRAVENOUS
  Administered 2012-01-27: 1700 [IU]/h via INTRAVENOUS
  Filled 2012-01-26 (×4): qty 250

## 2012-01-26 MED ORDER — OXYCODONE-ACETAMINOPHEN 5-325 MG PO TABS
ORAL_TABLET | ORAL | Status: AC
  Administered 2012-01-26: 1 via ORAL
  Filled 2012-01-26: qty 1

## 2012-01-26 MED ORDER — PARICALCITOL 5 MCG/ML IV SOLN
INTRAVENOUS | Status: AC
  Administered 2012-01-26: 4 ug via INTRAVENOUS
  Filled 2012-01-26: qty 1

## 2012-01-26 NOTE — Progress Notes (Signed)
Rehab Admissions Coordinator Note:  Patient was screened by Clois Dupes for appropriateness for an Inpatient Acute Rehab Consult. Pt previously in CIR 07/2011.  At this time, we are recommending Inpatient Rehab consult.  Clois Dupes, RN 01/26/2012, 5:15 PM  I can be reached at [phone].

## 2012-01-26 NOTE — Progress Notes (Signed)
   TELEMETRY: Reviewed telemetry pt in atrial flutter with controlled rate: Filed Vitals:   01/26/12 1000 01/26/12 1030 01/26/12 1100 01/26/12 1122  BP: 112/57 122/60 110/56 121/65  Pulse: 114 111 100 104  Temp:    98.2 F (36.8 C)  TempSrc:    Oral  Resp: 19 19 19 18   Height:      Weight:    233 lb 7.5 oz (105.9 kg)  SpO2:        Intake/Output Summary (Last 24 hours) at 01/26/12 1137 Last data filed at 01/26/12 1122  Gross per 24 hour  Intake    483 ml  Output   3972 ml  Net  -3489 ml    SUBJECTIVE Patient seen post op. Denies any chest pain or dyspnea.  LABS: Basic Metabolic Panel:  Basename 01/26/12 0733 01/25/12 0445 01/24/12 0741  NA 133* 137 --  K 4.1 4.0 --  CL 92* 95* --  CO2 26 29 --  GLUCOSE 125* 117* --  BUN 50* 31* --  CREATININE 8.22* 6.02* --  CALCIUM 8.7 8.9 --  MG -- -- --  PHOS 4.0 -- 4.4   Liver Function Tests:  Basename 01/26/12 0733 01/24/12 0741  AST -- --  ALT -- --  ALKPHOS -- --  BILITOT -- --  PROT -- --  ALBUMIN 2.4* 2.7*   No results found for this basename: LIPASE:2,AMYLASE:2 in the last 72 hours CBC:  Basename 01/26/12 0232 01/25/12 0445  WBC 10.7* 12.9*  NEUTROABS -- --  HGB 8.9* 10.2*  HCT 27.1* 32.3*  MCV 91.6 91.5  PLT 234 263     Radiology/Studies:  Dg Chest Port 1 View  01/23/2012  *RADIOLOGY REPORT*  Clinical Data: Fever.  Increasing cough and shortness of breath.  PORTABLE CHEST - 1 VIEW  Comparison: 07/18/2011  Findings: There is chronic cardiomegaly with tortuosity of the thoracic aorta.  There is a prominent left pericardial fat pad. There are no infiltrates or effusions.  No pulmonary edema.  No acute osseous abnormality.  Evidence of prior CABG.  IMPRESSION: Chronic cardiomegaly.  No acute abnormality.   Original Report Authenticated By: Francene Boyers, M.D.     PHYSICAL EXAM General: Well developed, elderly, in no acute distress. Head: Normal Neck: Negative for carotid bruits. JVD not elevated. Lungs:  Clear bilaterally to auscultation without wheezes, rales, or rhonchi. Breathing is unlabored. Heart: IRRR S1 S2 with grade 2-3/6 systolic murmur of AS  Abdomen: Soft, non-tender, non-distended with normoactive bowel sounds. No hepatomegaly. No rebound/guarding. No obvious abdominal masses. Extremities: left BKA, 1+ edema on right. Right foot wrapped. Neuro: Alert and oriented X 3. Moves all extremities spontaneously. Psych:  Responds to questions appropriately with a normal affect.  ASSESSMENT AND PLAN: 1. S/p right 2nd toe amputation - 1/17 2. Moderate to severe aortic stenosis- asymptomatic. We will obtain follow up Echo this admission. Patient is a poor surgical candidate for AVR.  3. ESRD on dialysis - examined in dialysis 4. Chronic anemia 5. Atrial fibrillation/flutter- rate well controlled. Resume coumadin per VVS. 6. CAD s/p CABG   Principal Problem:  *SIRS (systemic inflammatory response syndrome) Active Problems:  Aortic stenosis  CAD (coronary artery disease)  End stage renal disease on dialysis  Diabetes mellitus  Atherosclerosis of native arteries of the extremities with ulceration(440.23)  Wound infection  Unilateral complete BKA  Atrial fibrillation  Pre-operative cardiovascular examination    Signed, Elyn Aquas. MD,FACC 01/26/2012 11:37 AM

## 2012-01-26 NOTE — Progress Notes (Signed)
Physical Therapy Treatment Patient Details Name: Alan [name] MRN: [id number] DOB: [birth year] Today's Date: 01/26/2012 Time: 4098-1191 PT Time Calculation (min): 23 min  PT Assessment / Plan / Recommendation Comments on Treatment Session  Pt. too fatigued to attempt transfers or gait with prosthesis.  Activity limited to exercises at edge of bed    Follow Up Recommendations  CIR     Does the patient have the potential to tolerate intense rehabilitation     Barriers to Discharge        Equipment Recommendations  None recommended by PT    Recommendations for Other Services    Frequency Min 3X/week   Plan Discharge plan remains appropriate;Frequency remains appropriate    Precautions / Restrictions Precautions Precautions: Fall Restrictions Weight Bearing Restrictions: No Other Position/Activity Restrictions: no Wb restriction orders in chart, pt. wearing post op shoe in bed   Pertinent Vitals/Pain Pain in left foot, not rated.  Pt does not request pain med.  Repositioned for comfort after session.    Mobility  Bed Mobility Bed Mobility: Supine to Sit;Sitting - Scoot to Edge of Bed Supine to Sit: 4: Min assist Sitting - Scoot to [place] of Bed: 4: Min assist Sit to Supine: 4: Min assist Details for Bed Mobility Assistance: incr time, assist to guide hips using bed pad probably due to fatigue from HD Transfers Transfers: Not assessed (pt. reports too fatigued from HD) Ambulation/Gait Ambulation/Gait Assistance: Not tested (comment)    Exercises Amputee Exercises Hip ABduction/ADduction: Both;Seated;Strengthening Hip Flexion/Marching: Strengthening;Both;10 reps;Seated Knee Extension: Strengthening;Both;10 reps;Seated   PT Diagnosis:    PT Problem List:   PT Treatment Interventions:     PT Goals Acute Rehab PT Goals PT Goal Formulation:  (goals remain appropriate) PT Goal: Sit to Stand - Progress: Not progressing PT Goal: Stand to Sit - Progress: Not  progressing PT Transfer Goal: Bed to Chair/Chair to Bed - Progress: Not progressing PT Goal: Stand - Progress: Not progressing  Visit Information  Last PT Received On: 01/26/12 Assistance Needed: +2    Subjective Data  Subjective: Pt. reports being very tired   Cognition  Overall Cognitive Status: Appears within functional limits for tasks assessed/performed Arousal/Alertness: Awake/alert Orientation Level: Appears intact for tasks assessed Behavior During Session: Alan [name] for tasks performed    Balance     End of Session PT - End of Session Activity Tolerance: Patient limited by fatigue Patient left: in bed Nurse Communication: Mobility status   GP     Alan [name] 01/26/2012, 3:51 PM Weldon Picking PT Acute Rehab Services [phone] Beeper [phone]

## 2012-01-26 NOTE — Progress Notes (Signed)
PATIENT DETAILS Name: Alan [name] Age: 79 y.o. Sex: male Date of Birth: 1931-03-25 Admit Date: 01/21/2012 Admitting Physician Eddie North, MD ZOX:[id number],WJXBJYN D, MD  Subjective: No major complaints  Assessment/Plan: Gangrenous right second toe - Status post ray amputation 1/17 - Continue with antibiotics for 48 hours postop- per Dr. Edilia Bo - PTOT  SIRS (systemic inflammatory response syndrome)  -resolved - Secondary to above  Severe aortic stenosis - Per cardiology-not operative candidate  Atrial fibrillation  Patient was on Coumadin with supratherapeutic INR. - Now on overlapping heparin infusion and Coumadin-pharmacy dosing - On metoprolol for rate control  End-stage renal disease on dialysis  - Per nephrology  Anemia  - From end-stage renal  - Per nephrology  Diabetes mellitus  - CBGs stable - Continue with SSI  History of gout - Continue with allopurinol  Coronary artery disease - Stable  Disposition: Remain inpatient  DVT Prophylaxis: Not needed as on heparin infusion  Code Status: Full code   Procedures:  None  CONSULTS:  cardiology, nephrology and vascular surgery  PHYSICAL EXAM: Vital signs in last 24 hours: Filed Vitals:   01/26/12 1100 01/26/12 1122 01/26/12 1157 01/26/12 1359  BP: 110/56 121/65 114/55 97/48  Pulse: 100 104 112 102  Temp:  98.2 F (36.8 C) 98.6 F (37 C) 98.7 F (37.1 C)  TempSrc:  Oral    Resp: 19 18 17 18   Height:      Weight:  105.9 kg (233 lb 7.5 oz)    SpO2:   98% 99%    Weight change: -21.3 kg (-46 lb 15.3 oz) Body mass index is 31.66 kg/(m^2).   Gen Exam: Awake and alert with clear speech.   Neck: Supple, No JVD.  Chest: B/L Clear.   CVS: S1 S2 Regular, no murmurs.  Abdomen: soft, BS +, non tender, non distended.  Extremities: Status post left BKA, status post right second ray amputation  Neurologic: Non Focal.   Skin: No Rash.   Wounds: N/A.    Intake/Output from previous  day:  Intake/Output Summary (Last 24 hours) at 01/26/12 1435 Last data filed at 01/26/12 1359  Gross per 24 hour  Intake    720 ml  Output   3972 ml  Net  -3252 ml     LAB RESULTS: CBC  Lab 01/26/12 0232 01/25/12 0445 01/24/12 0500 01/23/12 0650 01/22/12 0640 01/21/12 1809  WBC 10.7* 12.9* 12.5* 13.0* 13.0* --  HGB 8.9* 10.2* 9.7* 10.0* 9.5* --  HCT 27.1* 32.3* 29.3* 30.9* 28.6* --  PLT 234 263 252 229 213 --  MCV 91.6 91.5 89.3 90.4 89.4 --  MCH 30.1 28.9 29.6 29.2 29.7 --  MCHC 32.8 31.6 33.1 32.4 33.2 --  RDW 13.2 13.1 12.9 13.0 12.8 --  LYMPHSABS -- -- -- -- 1.4 0.9  MONOABS -- -- -- -- 1.2* 1.2*  EOSABS -- -- -- -- 0.0 0.0  BASOSABS -- -- -- -- 0.0 0.0  BANDABS -- -- -- -- -- --    Chemistries   Lab 01/26/12 0733 01/25/12 0445 01/24/12 0741 01/23/12 0650 01/22/12 0640  NA 133* 137 135 133* 135  K 4.1 4.0 4.3 4.7 3.8  CL 92* 95* 93* 92* 91*  CO2 26 29 26 25 29   GLUCOSE 125* 117* 84 135* 105*  BUN 50* 31* 50* 34* 55*  CREATININE 8.22* 6.02* 8.51* 6.34* 9.53*  CALCIUM 8.7 8.9 8.6 8.7 8.4  MG -- -- -- 2.0 --    CBG:  Lab 01/26/12  1153 01/25/12 2154 01/25/12 1650 01/25/12 1136 01/25/12 0956  GLUCAP 169* 198* 147* 138* 125*    GFR Estimated Creatinine Clearance: 9.3 ml/min (by C-G formula based on Cr of 8.22).  Coagulation profile  Lab 01/26/12 1050 01/25/12 0445 01/24/12 0500 01/23/12 0650 01/22/12 0640  INR 1.40 1.54* 2.92* 3.28* 5.27*  PROTIME -- -- -- -- --    Cardiac Enzymes No results found for this basename: CK:3,CKMB:3,TROPONINI:3,MYOGLOBIN:3 in the last 168 hours  No components found with this basename: POCBNP:3 No results found for this basename: DDIMER:2 in the last 72 hours No results found for this basename: HGBA1C:2 in the last 72 hours No results found for this basename: CHOL:2,HDL:2,LDLCALC:2,TRIG:2,CHOLHDL:2,LDLDIRECT:2 in the last 72 hours No results found for this basename: TSH,T4TOTAL,FREET3,T3FREE,THYROIDAB in the last 72  hours No results found for this basename: VITAMINB12:2,FOLATE:2,FERRITIN:2,TIBC:2,IRON:2,RETICCTPCT:2 in the last 72 hours No results found for this basename: LIPASE:2,AMYLASE:2 in the last 72 hours  Urine Studies No results found for this basename: UACOL:2,UAPR:2,USPG:2,UPH:2,UTP:2,UGL:2,UKET:2,UBIL:2,UHGB:2,UNIT:2,UROB:2,ULEU:2,UEPI:2,UWBC:2,URBC:2,UBAC:2,CAST:2,CRYS:2,UCOM:2,BILUA:2 in the last 72 hours  MICROBIOLOGY: Recent Results (from the past 240 hour(s))  CULTURE, BLOOD (ROUTINE X 2)     Status: Normal (Preliminary result)   Collection Time   01/22/12  6:40 AM      Component Value Range Status Comment   Specimen Description BLOOD RIGHT ARM   Final    Special Requests BOTTLES DRAWN AEROBIC AND ANAEROBIC 10CC   Final    Culture  Setup Time 01/22/2012 15:58   Final    Culture     Final    Value:        BLOOD CULTURE RECEIVED NO GROWTH TO DATE CULTURE WILL BE HELD FOR 5 DAYS BEFORE ISSUING A FINAL NEGATIVE REPORT   Report Status PENDING   Incomplete   CULTURE, BLOOD (ROUTINE X 2)     Status: Normal (Preliminary result)   Collection Time   01/22/12  6:45 AM      Component Value Range Status Comment   Specimen Description BLOOD LEFT ANTECUBITAL   Final    Special Requests BOTTLES DRAWN AEROBIC AND ANAEROBIC 10CC   Final    Culture  Setup Time 01/22/2012 15:58   Final    Culture     Final    Value:        BLOOD CULTURE RECEIVED NO GROWTH TO DATE CULTURE WILL BE HELD FOR 5 DAYS BEFORE ISSUING A FINAL NEGATIVE REPORT   Report Status PENDING   Incomplete   MRSA PCR SCREENING     Status: Normal   Collection Time   01/22/12  7:45 AM      Component Value Range Status Comment   MRSA by PCR NEGATIVE  NEGATIVE Final   SURGICAL PCR SCREEN     Status: Normal   Collection Time   01/24/12  4:26 PM      Component Value Range Status Comment   MRSA, PCR NEGATIVE  NEGATIVE Final    Staphylococcus aureus NEGATIVE  NEGATIVE Final   WOUND CULTURE     Status: Normal (Preliminary result)    Collection Time   01/25/12 11:21 AM      Component Value Range Status Comment   Specimen Description WOUND TOE   Final    Special Requests RT SECOND TOE AMPUTATION SITE PT ON VANC ZOSYN   Final    Gram Stain     Final    Value: ABUNDANT WBC PRESENT, PREDOMINANTLY PMN     NO SQUAMOUS EPITHELIAL CELLS SEEN     MODERATE  GRAM POSITIVE COCCI     IN PAIRS   Culture Culture reincubated for better growth   Final    Report Status PENDING   Incomplete     RADIOLOGY STUDIES/RESULTS: Dg Chest Port 1 View  01/23/2012  *RADIOLOGY REPORT*  Clinical Data: Fever.  Increasing cough and shortness of breath.  PORTABLE CHEST - 1 VIEW  Comparison: 07/18/2011  Findings: There is chronic cardiomegaly with tortuosity of the thoracic aorta.  There is a prominent left pericardial fat pad. There are no infiltrates or effusions.  No pulmonary edema.  No acute osseous abnormality.  Evidence of prior CABG.  IMPRESSION: Chronic cardiomegaly.  No acute abnormality.   Original Report Authenticated By: Francene Boyers, M.D.     MEDICATIONS: Scheduled Meds:   . allopurinol  150 mg Oral Daily  . ampicillin-sulbactam (UNASYN) IV  1.5 g Intravenous Q12H  . calcium carbonate  1 tablet Oral TID WC  . darbepoetin (ARANESP) injection - DIALYSIS  150 mcg Intravenous Q Tue-HD  . ferric gluconate (FERRLECIT/NULECIT) IV  125 mg Intravenous Q Thu-HD  . insulin aspart  0-9 Units Subcutaneous TID WC  . metoprolol tartrate  37.5 mg Oral BID  . multivitamin  1 tablet Oral QHS  . paricalcitol  4 mcg Intravenous Q T,Th,Sa-HD  . simvastatin  40 mg Oral QHS  . sodium chloride  3 mL Intravenous Q12H  . vancomycin  1,000 mg Intravenous Q T,Th,Sa-HD  . warfarin  5 mg Oral ONCE-1800  . Warfarin - Pharmacist Dosing Inpatient   Does not apply q1800   Continuous Infusions:   . sodium chloride 20 mL/hr at 01/25/12 1005  . heparin 1,500 Units/hr (01/26/12 1321)   PRN Meds:.acetaminophen, acetaminophen,  oxyCODONE-acetaminophen  Antibiotics: Anti-infectives     Start     Dose/Rate Route Frequency Ordered Stop   01/22/12 1800   ampicillin-sulbactam (UNASYN) 1.5 g in sodium chloride 0.9 % 50 mL IVPB        1.5 g 100 mL/hr over 30 Minutes Intravenous Every 12 hours 01/22/12 0310     01/22/12 1200   vancomycin (VANCOCIN) IVPB 1000 mg/200 mL premix        1,000 mg 200 mL/hr over 60 Minutes Intravenous Every T-Th-Sa (Hemodialysis) 01/22/12 0310     01/22/12 0400   vancomycin (VANCOCIN) 1,250 mg in sodium chloride 0.9 % 250 mL IVPB        1,250 mg 166.7 mL/hr over 90 Minutes Intravenous  Once 01/22/12 0310 01/22/12 0552   01/22/12 0330   ampicillin-sulbactam (UNASYN) 1.5 g in sodium chloride 0.9 % 50 mL IVPB        1.5 g 100 mL/hr over 30 Minutes Intravenous  Once 01/22/12 0310 01/22/12 0405   01/22/12 0100   vancomycin (VANCOCIN) IVPB 1000 mg/200 mL premix        1,000 mg 200 mL/hr over 60 Minutes Intravenous  Once 01/22/12 0054 01/22/12 0226           Jeoffrey Massed, MD  [facility] Pager:[phone]  If 7PM-7AM, please contact night-coverage [email or web address] Password [email or web address] 01/26/2012, 2:35 PM   LOS: 5 days

## 2012-01-26 NOTE — Procedures (Signed)
I was present at this dialysis session. I have reviewed the session itself and made appropriate changes.   Vinson Moselle, MD [employer] 01/26/2012, 8:51 AM

## 2012-01-26 NOTE — Progress Notes (Signed)
ANTICOAGULATION/ANTIBIOTIC CONSULT NOTE - Follow Up Consult  Pharmacy Consult for Heparin/Coumadin, Vancomycin/Unasyn Indication: atrial fibrillation, Right great toe infection  Allergies  Allergen Reactions  . Ancef (Cefazolin) Rash    Patient Measurements: Height: 6' (182.9 cm) Weight: 233 lb 7.5 oz (105.9 kg) IBW/kg (Calculated) : 77.6  Heparin Dosing Weight:~100kg  Vital Signs: Temp: 98.6 F (37 C) (01/18 1157) Temp src: Oral (01/18 1122) BP: 114/55 mmHg (01/18 1157) Pulse Rate: 112  (01/18 1157)  Labs:  Basename 01/26/12 1210 01/26/12 1050 01/26/12 0733 01/26/12 0232 01/25/12 0445 01/24/12 0741 01/24/12 0500  HGB -- -- -- 8.9* 10.2* -- --  HCT -- -- -- 27.1* 32.3* -- 29.3*  PLT -- -- -- 234 263 -- 252  APTT -- -- -- -- -- -- --  LABPROT -- 16.8* -- -- 18.0* -- 29.0*  INR -- 1.40 -- -- 1.54* -- 2.92*  HEPARINUNFRC 0.20* -- -- >2.00* -- -- --  CREATININE -- -- 8.22* -- 6.02* 8.51* --  CKTOTAL -- -- -- -- -- -- --  CKMB -- -- -- -- -- -- --  TROPONINI -- -- -- -- -- -- --    Estimated Creatinine Clearance: 9.3 ml/min (by C-G formula based on Cr of 8.22).   Medications:  Heparin @ 1250 units/hr  Assessment: 80yom started on heparin s/p toe amputation 1/17 for afib while his coumadin remained on hold. He is to resume coumadin today. He was admitted with a supratherpeutic INR, received vitamin k 1mg  IV x 1 on 1/16, and INR now down to 1.4. Home dose is 5mg  daily except 2.5mg  on Friday but he may require higher doses initially to overcome vitamin k resistance.  Also continues on day #5 of IV antibiotics for right great toe infection. Random vancomycin level drawn after HD instead of before and ~ 2 hours after maintenance dose given is actually within goal range. Difficult to interpret but will continue same regimen for now. Tolerated today's HD session (4 hrs, BFR 400). Unasyn dose appropriate.  Vancomycin 1/14>> Unasyn 1/14>>  1/14 - blood CX x 2 - ngtd 1/14 -  MRSA PCR negative  Goal of Therapy:  Heparin level 0.3-0.7 units/ml INR 2-3 Monitor platelets by anticoagulation protocol: Yes Pre-HD Vancomycin level 15-25   Plan:  1) Increase heparin to 1500 units/hr 2) Check 8 hour heparin level 3) Coumadin 5mg  x 1 4) Follow up INR in AM 5) Continue vancomycin 1g IV QHD - may want to check another level next week 6) Continue unasyn 1.5g IV q12  [name] 01/26/2012,1:10 PM

## 2012-01-26 NOTE — Progress Notes (Signed)
ANTICOAGULATION CONSULT NOTE - Follow Up Consult  Pharmacy Consult for Heparin Indication: atrial fibrillation  Allergies  Allergen Reactions  . Ancef (Cefazolin) Rash    Patient Measurements: Height: 6' (182.9 cm) Weight: 225 lb 15.5 oz (102.5 kg) IBW/kg (Calculated) : 77.6  Heparin Dosing Weight: 106 kg  Vital Signs: Temp: 99.4 F (37.4 C) (01/18 2344) Temp src: Oral (01/18 2344) BP: 106/53 mmHg (01/18 2221) Pulse Rate: 104  (01/18 2221)  Labs:  Basename 01/26/12 2153 01/26/12 1210 01/26/12 1050 01/26/12 0733 01/26/12 0232 01/25/12 0445 01/24/12 0741 01/24/12 0500  HGB -- -- -- -- 8.9* 10.2* -- --  HCT -- -- -- -- 27.1* 32.3* -- 29.3*  PLT -- -- -- -- 234 263 -- 252  APTT -- -- -- -- -- -- -- --  LABPROT -- -- 16.8* -- -- 18.0* -- 29.0*  INR -- -- 1.40 -- -- 1.54* -- 2.92*  HEPARINUNFRC <0.10* 0.20* -- -- >2.00* -- -- --  CREATININE -- -- -- 8.22* -- 6.02* 8.51* --  CKTOTAL -- -- -- -- -- -- -- --  CKMB -- -- -- -- -- -- -- --  TROPONINI -- -- -- -- -- -- -- --    Estimated Creatinine Clearance: 9.2 ml/min (by C-G formula based on Cr of 8.22).   Medical History: Past Medical History  Diagnosis Date  . Diabetes mellitus   . Gout   . Hypertension   . Hyperlipidemia   . Chronic kidney disease     Started dialysis October 2012  . Arthritis   . Prostate cancer     s/p seed implant  . Cataract     BILATERAL-BEEN REMOVED  . Neuromuscular disorder     DABETIC NEUROPATHY-LOWER EXTREMITY  . Atrial fibrillation     on coumadin  . Aortic stenosis     moderate to severe, not felt to be a surgical candidate  . Anemia     secondary to end stage renal disease  . Coronary artery disease     remote CABG in 1996; last stress test in 2005; last cath 2005  . Diabetic neuropathy   . Edema   . PAD (peripheral artery disease)   . Diabetic retinopathy(362.0)   . Chronic anticoagulation     on coumadin  . Nephrolithiasis     prior history of percutaneous nephrostomy    . Shortness of breath     Medications:  Scheduled:     . allopurinol  150 mg Oral Daily  . ampicillin-sulbactam (UNASYN) IV  1.5 g Intravenous Q12H  . calcium carbonate  1 tablet Oral TID WC  . darbepoetin (ARANESP) injection - DIALYSIS  150 mcg Intravenous Q Tue-HD  . ferric gluconate (FERRLECIT/NULECIT) IV  125 mg Intravenous Q Thu-HD  . insulin aspart  0-9 Units Subcutaneous TID WC  . metoprolol tartrate  37.5 mg Oral BID  . multivitamin  1 tablet Oral QHS  . paricalcitol  4 mcg Intravenous Q T,Th,Sa-HD  . simvastatin  40 mg Oral QHS  . sodium chloride  3 mL Intravenous Q12H  . vancomycin  1,000 mg Intravenous Q T,Th,Sa-HD  . [COMPLETED] warfarin  5 mg Oral ONCE-1800  . Warfarin - Pharmacist Dosing Inpatient   Does not apply q1800    Assessment: 79 year old male s/p toe amputation on heparin bridge while coumadin is subtherapeutic.  Heparin level <.10 units/ml.  Goal of Therapy:  Heparin level 0.3-0.7 units/ml Monitor platelets by anticoagulation protocol: Yes   Plan:  Increase  heparin infusion to 1700 units/hr Check Heparin level 8 hours after rate change  Lynnelle Mesmer, Pharm.D. Clinical Pharmacist  Phone [phone] Pager [phone] 01/26/2012, 11:48 PM

## 2012-01-26 NOTE — Progress Notes (Signed)
No complaints  Filed Vitals:   01/26/12 0930 01/26/12 1000 01/26/12 1030 01/26/12 1100  BP: 101/53 112/57 122/60 110/56  Pulse: 105 114 111 100  Temp:      TempSrc:      Resp: 16 19 19 19   Height:      Weight:      SpO2:       Right foot wound clean, no significant bleeding.  Dressing changed and repacked.  Start dressing changes wet to dry TID Ok to resume coumadin Dr Edilia Bo will recheck on Monday  Fabienne Bruns, MD [facility] Office: [phone] Pager: [phone]

## 2012-01-26 NOTE — Progress Notes (Signed)
. Subjective:  On hd , tylenol helping foot pain Objective Vital signs in last 24 hours: Filed Vitals:   01/26/12 0720 01/26/12 0730 01/26/12 0800 01/26/12 0830  BP: 104/60 107/56 100/55 123/59  Pulse: 77 84 87 86  Temp:      TempSrc:      Resp: 29 19 15 15   Height:      Weight:      SpO2:       Weight change: -21.3 kg (-46 lb 15.3 oz)  Intake/Output Summary (Last 24 hours) at 01/26/12 0852 Last data filed at 01/26/12 0439  Gross per 24 hour  Intake    483 ml  Output    300 ml  Net    183 ml   Labs: Basic Metabolic Panel:  Lab 01/26/12 1610 01/25/12 0445 01/24/12 0741  NA 133* 137 135  K 4.1 4.0 4.3  CL 92* 95* 93*  CO2 26 29 26   GLUCOSE 125* 117* 84  BUN 50* 31* 50*  CREATININE 8.22* 6.02* 8.51*  CALCIUM 8.7 8.9 8.6  ALB -- -- --  PHOS 4.0 -- 4.4   Liver Function Tests:  Lab 01/26/12 0733 01/24/12 0741 01/21/12 1809  AST -- -- 19  ALT -- -- 12  ALKPHOS -- -- 53  BILITOT -- -- 0.7  PROT -- -- 7.8  ALBUMIN 2.4* 2.7* 3.4*   No results found for this basename: LIPASE:3,AMYLASE:3 in the last 168 hours No results found for this basename: AMMONIA:3 in the last 168 hours CBC:  Lab 01/26/12 0232 01/25/12 0445 01/24/12 0500 01/23/12 0650 01/22/12 0640 01/21/12 1809  WBC 10.7* 12.9* 12.5* -- -- --  NEUTROABS -- -- -- -- 10.4* 12.0*  HGB 8.9* 10.2* 9.7* -- -- --  HCT 27.1* 32.3* 29.3* -- -- --  MCV 91.6 91.5 89.3 90.4 89.4 --  PLT 234 263 252 -- -- --   Cardiac Enzymes: No results found for this basename: CKTOTAL:5,CKMB:5,CKMBINDEX:5,TROPONINI:5 in the last 168 hours CBG:  Lab 01/25/12 2154 01/25/12 1650 01/25/12 1136 01/25/12 0956 01/25/12 0749  GLUCAP 198* 147* 138* 125* 114*    Iron Studies: No results found for this basename: IRON,TIBC,TRANSFERRIN,FERRITIN in the last 72 hours Studies/Results: No results found. Medications:    . sodium chloride 20 mL/hr at 01/25/12 1005  . heparin 1,250 Units/hr (01/25/12 1834)      . allopurinol  150 mg Oral  Daily  . ampicillin-sulbactam (UNASYN) IV  1.5 g Intravenous Q12H  . calcium carbonate  1 tablet Oral TID WC  . darbepoetin (ARANESP) injection - DIALYSIS  150 mcg Intravenous Q Tue-HD  . ferric gluconate (FERRLECIT/NULECIT) IV  125 mg Intravenous Q Thu-HD  . insulin aspart  0-9 Units Subcutaneous TID WC  . metoprolol tartrate  37.5 mg Oral BID  . multivitamin  1 tablet Oral QHS  . paricalcitol  4 mcg Intravenous Q T,Th,Sa-HD  . simvastatin  40 mg Oral QHS  . sodium chloride  3 mL Intravenous Q12H  . vancomycin  1,000 mg Intravenous Q T,Th,Sa-HD  . Warfarin - Pharmacist Dosing Inpatient   Does not apply q1800   I  have reviewed scheduled and prn medications.  Physical Exam:  Gen: no distress, alert , on hd  Heart: Irreg , irreg, 2/6 murmur at apex / a fib  111 rate Lungs: CTA bilat  Abdomen: Obese, soft, nontender  Extremities: Left BKA, Right lower leg with edema 1+ and foot wrapped lDialysis Access: Patent on hd. R U A AVF  Dialysis Orders: Center: [facility] on TTS .  EDW 106 kg HD Bath 2.0 k, 2.25 ca Time 4 hrs 15 min Heparin 3,400units. Access Right upper arm avf BFR 500 DFR 800 Hectoral 4 mcg IV/HD Epogen 1000 on tue and sat IV/HD Venofer 100mg  q weekly hd  Other 0   Assessment/Plan  1. Gangrene R foot, s/p 2nd toe amp 1/17- per [facility] 2. ESRD, cont tts HD 3. HTN/volume- at dry weight, UF 4 kg today as tol 4. Anemia - 9.5 >9.7> 10.2>8.9hgb  and weekly iron  And on  Aranesp 150 mg weekly tues fu am hgb  5. MBD - Ca and phos in normal range, cont vit D and tums as binder 6. Atrial Fib with elevated INR= Pharmacy dosing coumadin  and heparin / metoprolol 37.5 bid 7. CAD Sp CAGB 1996/ Ho mod. Aortic Stenosis not surgical candidate / card following 8. DM Type 2 = per admit team  Lenny Pastel, PA-C [facility] [phone] 01/26/2012,8:52 AM  LOS: 5 days   Patient seen and examined and above reviewed.  Agree with assessment and plan as above. Vinson Moselle   MD [facility] [phone] pgr    [phone] cell 01/26/2012, 11:50 AM

## 2012-01-27 LAB — GLUCOSE, CAPILLARY
Glucose-Capillary: 152 mg/dL — ABNORMAL HIGH (ref 70–99)
Glucose-Capillary: 155 mg/dL — ABNORMAL HIGH (ref 70–99)

## 2012-01-27 LAB — CBC
HCT: 34.1 % — ABNORMAL LOW (ref 39.0–52.0)
MCH: 29.3 pg (ref 26.0–34.0)
MCHC: 32 g/dL (ref 30.0–36.0)
MCV: 91.7 fL (ref 78.0–100.0)
Platelets: 310 10*3/uL (ref 150–400)
RDW: 13.3 % (ref 11.5–15.5)
WBC: 14.1 10*3/uL — ABNORMAL HIGH (ref 4.0–10.5)

## 2012-01-27 LAB — HEPARIN LEVEL (UNFRACTIONATED)
Heparin Unfractionated: 0.32 IU/mL (ref 0.30–0.70)
Heparin Unfractionated: 0.35 IU/mL (ref 0.30–0.70)
Heparin Unfractionated: 0.24 IU/mL — ABNORMAL LOW (ref 0.30–0.70)

## 2012-01-27 MED ORDER — METOPROLOL TARTRATE 12.5 MG HALF TABLET
12.5000 mg | ORAL_TABLET | Freq: Two times a day (BID) | ORAL | Status: DC
  Administered 2012-01-27 – 2012-01-28 (×3): 12.5 mg via ORAL
  Filled 2012-01-27 (×4): qty 1

## 2012-01-27 MED ORDER — HEPARIN (PORCINE) IN NACL 100-0.45 UNIT/ML-% IJ SOLN
1900.0000 [IU]/h | INTRAMUSCULAR | Status: DC
  Administered 2012-01-27 – 2012-01-28 (×3): 1900 [IU]/h via INTRAVENOUS
  Filled 2012-01-27 (×4): qty 250

## 2012-01-27 MED ORDER — WARFARIN SODIUM 5 MG PO TABS
5.0000 mg | ORAL_TABLET | Freq: Once | ORAL | Status: DC
  Administered 2012-01-27: 5 mg via ORAL
  Filled 2012-01-27: qty 1

## 2012-01-27 NOTE — Progress Notes (Signed)
Subjective:  No cos, about to eat Lunch,Wants to go to Rehab. Objective Vital signs in last 24 hours: Filed Vitals:   01/26/12 2344 01/27/12 0051 01/27/12 0504 01/27/12 1000  BP:  96/39 123/46 110/66  Pulse:  87 81 98  Temp: 99.4 F (37.4 C) 99 F (37.2 C) 98.6 F (37 C) 98.1 F (36.7 C)  TempSrc: Oral Oral Oral Oral  Resp:  18 18 19   Height:      Weight:      SpO2:  97% 100% 97%   Weight change: 5.6 kg (12 lb 5.5 oz)  Intake/Output Summary (Last 24 hours) at 01/27/12 1236 Last data filed at 01/27/12 0900  Gross per 24 hour  Intake 1897.75 ml  Output      0 ml  Net 1897.75 ml   Labs: Basic Metabolic Panel:  Lab 01/26/12 1610 01/25/12 0445 01/24/12 0741  NA 133* 137 135  K 4.1 4.0 4.3  CL 92* 95* 93*  CO2 26 29 26   GLUCOSE 125* 117* 84  BUN 50* 31* 50*  CREATININE 8.22* 6.02* 8.51*  CALCIUM 8.7 8.9 8.6  ALB -- -- --  PHOS 4.0 -- 4.4   Liver Function Tests:  Lab 01/26/12 0733 01/24/12 0741 01/21/12 1809  AST -- -- 19  ALT -- -- 12  ALKPHOS -- -- 53  BILITOT -- -- 0.7  PROT -- -- 7.8  ALBUMIN 2.4* 2.7* 3.4*   No results found for this basename: LIPASE:3,AMYLASE:3 in the last 168 hours No results found for this basename: AMMONIA:3 in the last 168 hours CBC:  Lab 01/27/12 0630 01/26/12 0232 01/25/12 0445 01/24/12 0500 01/23/12 0650 01/22/12 0640 01/21/12 1809  WBC 14.1* 10.7* 12.9* -- -- -- --  NEUTROABS -- -- -- -- -- 10.4* 12.0*  HGB 10.9* 8.9* 10.2* -- -- -- --  HCT 34.1* 27.1* 32.3* -- -- -- --  MCV 91.7 91.6 91.5 89.3 90.4 -- --  PLT 310 234 263 -- -- -- --   Cardiac Enzymes: No results found for this basename: CKTOTAL:5,CKMB:5,CKMBINDEX:5,TROPONINI:5 in the last 168 hours CBG:  Lab 01/27/12 1155 01/27/12 0808 01/27/12 0055 01/26/12 2336 01/26/12 2226  GLUCAP 152* 137* 114* 95 100*    Iron Studies: No results found for this basename: IRON,TIBC,TRANSFERRIN,FERRITIN in the last 72 hours Studies/Results: No results found. Medications:    .  sodium chloride 20 mL/hr (01/27/12 0011)  . heparin 1,700 Units/hr (01/27/12 0010)      . allopurinol  150 mg Oral Daily  . ampicillin-sulbactam (UNASYN) IV  1.5 g Intravenous Q12H  . calcium carbonate  1 tablet Oral TID WC  . darbepoetin (ARANESP) injection - DIALYSIS  150 mcg Intravenous Q Tue-HD  . ferric gluconate (FERRLECIT/NULECIT) IV  125 mg Intravenous Q Thu-HD  . insulin aspart  0-9 Units Subcutaneous TID WC  . metoprolol tartrate  12.5 mg Oral BID  . multivitamin  1 tablet Oral QHS  . paricalcitol  4 mcg Intravenous Q T,Th,Sa-HD  . simvastatin  40 mg Oral QHS  . sodium chloride  3 mL Intravenous Q12H  . vancomycin  1,000 mg Intravenous Q T,Th,Sa-HD  . warfarin  5 mg Oral ONCE-1800  . Warfarin - Pharmacist Dosing Inpatient   Does not apply q1800   I  have reviewed scheduled and prn medications.  Physical Exam:  Gen: Sitting upright in bed, no distress, alert  Heart: Irreg , irreg, 2/6 murmur at apex / a fib 84 rate  On tele Lungs: CTA bilat  Abdomen: Obese, soft, nontender  Extremities: Left BKA, Right lower leg with edema 1+ and foot wrapped  lDialysis Access: Positive bruit R U A AVF   Dialysis Orders: Center: [facility] on TTS .  EDW 106 kg HD Bath 2.0 k, 2.25 ca Time 4 hrs 15 min Heparin 3,400units. Access Right upper arm avf BFR 500 DFR 800 Hectoral 4 mcg IV/HD Epogen 1000 on tue and sat IV/HD Venofer 100mg  q weekly hd  Other 0  Assessment/Plan  1. Gangrene R foot, s/p 2nd toe amp 1/17- per [facility]VS/ fever to  101.1 last pm now afebrile/ noted Dr. Edilia Bo plans to continue antibiotics until 01/28/12   NOTED  [facility] would take as inpt when ready 2. ESRD, cont tts HD 3. HTN/volume- uf yesterday in hd 3972 to  post wt 105.9  And rewt 102.5 ( bed wts not accurate)at dry BP 110/66 this am 12.5 mg bid Metoprolol 12.5 mg bid 4. Anemia - 9.5 >9.7> 10.2>8.9> 10.9 hgb on  weekly iron and on Aranesp 150 mg weekly hd 5. MBD - Ca and phos in normal range, cont vit D and tums as  binder 6. Atrial Fib with elevated INR= Pharmacy dosing coumadin and heparin / metoprolol  bid 7. CAD Sp CAGB 1996/ Ho mod. Aortic Stenosis not surgical candidate / card following noted 2 d echo ordered 8. DM Type 2 = per admit team   Lenny Pastel, PA-C [facility] [phone] 01/27/2012,12:36 PM  LOS: 6 days   Patient seen and examined and above reviewed.  Agree with assessment and plan as above. Vinson Moselle  MD [facility] [phone] pgr    [phone] cell 01/27/2012, 9:24 PM

## 2012-01-27 NOTE — Progress Notes (Signed)
ANTICOAGULATION CONSULT NOTE - Follow Up Consult  Pharmacy Consult for Heparin and Coumadin Indication: atrial fibrillation  Allergies  Allergen Reactions  . Ancef (Cefazolin) Rash    Patient Measurements: Height: 6' (182.9 cm) Weight: 225 lb 15.5 oz (102.5 kg) IBW/kg (Calculated) : 77.6  Heparin Dosing Weight: 106kg  Vital Signs: Temp: 99.5 F (37.5 C) (01/19 1800) Temp src: Oral (01/19 1800) BP: 113/65 mmHg (01/19 1800) Pulse Rate: 85  (01/19 1800)  Labs:  Basename 01/27/12 1731 01/27/12 0825 01/27/12 0630 01/27/12 0605 01/26/12 1050 01/26/12 0733 01/26/12 0232 01/25/12 0445  HGB -- -- 10.9* -- -- -- 8.9* --  HCT -- -- 34.1* -- -- -- 27.1* 32.3*  PLT -- -- 310 -- -- -- 234 263  APTT -- -- -- -- -- -- -- --  LABPROT -- -- 18.1* -- 16.8* -- -- 18.0*  INR -- -- 1.55* -- 1.40 -- -- 1.54*  HEPARINUNFRC 0.24* 0.35 -- 0.32 -- -- -- --  CREATININE -- -- -- -- -- 8.22* -- 6.02*  CKTOTAL -- -- -- -- -- -- -- --  CKMB -- -- -- -- -- -- -- --  TROPONINI -- -- -- -- -- -- -- --    Estimated Creatinine Clearance: 9.2 ml/min (by C-G formula based on Cr of 8.22).   Medications:  Heparin @ 1700 units/hr  Assessment: 80yom continues on heparin and coumadin for afib s/p toe amputation 1/17.   Follow up heparin level below goal. No bleeding issues noted, drip infusing appropriately per nursing.    Goal of Therapy:  INR 2-3 Heparin level 0.3-0.7 Monitor platelets by anticoagulation protocol: Yes   Plan:  1) Increase heparin to 1900 units/hr 2) Heparin level in 8 hours  Severiano Gilbert 01/27/2012,6:44 PM

## 2012-01-27 NOTE — Progress Notes (Signed)
ANTICOAGULATION CONSULT NOTE - Follow Up Consult  Pharmacy Consult for Heparin and Coumadin Indication: atrial fibrillation  Allergies  Allergen Reactions  . Ancef (Cefazolin) Rash    Patient Measurements: Height: 6' (182.9 cm) Weight: 225 lb 15.5 oz (102.5 kg) IBW/kg (Calculated) : 77.6  Heparin Dosing Weight: 106kg  Vital Signs: Temp: 98.6 F (37 C) (01/19 0504) Temp src: Oral (01/19 0504) BP: 123/46 mmHg (01/19 0504) Pulse Rate: 81  (01/19 0504)  Labs:  Basename 01/27/12 0825 01/27/12 0630 01/26/12 2153 01/26/12 1210 01/26/12 1050 01/26/12 0733 01/26/12 0232 01/25/12 0445  HGB -- 10.9* -- -- -- -- 8.9* --  HCT -- 34.1* -- -- -- -- 27.1* 32.3*  PLT -- 310 -- -- -- -- 234 263  APTT -- -- -- -- -- -- -- --  LABPROT -- 18.1* -- -- 16.8* -- -- 18.0*  INR -- 1.55* -- -- 1.40 -- -- 1.54*  HEPARINUNFRC 0.35 -- <0.10* 0.20* -- -- -- --  CREATININE -- -- -- -- -- 8.22* -- 6.02*  CKTOTAL -- -- -- -- -- -- -- --  CKMB -- -- -- -- -- -- -- --  TROPONINI -- -- -- -- -- -- -- --    Estimated Creatinine Clearance: 9.2 ml/min (by C-G formula based on Cr of 8.22).   Medications:  Heparin @ 1700 units/hr  Assessment: 80yom continues on heparin and coumadin for afib s/p toe amputation 1/17. Heparin level is therapeutic. Coumadin resumed yesterday and INR has increased slightly 1.4-->1.55 after 1 dose. CBC is stable. No bleeding reported.  Goal of Therapy:  INR 2-3 Heparin level 0.3-0.7 Monitor platelets by anticoagulation protocol: Yes   Plan:  1) Continue heparin at 1700 units/hr 2) Heparin level in 8 hours to confirm 3) Repeat coumadin 5mg  x 1 4) Follow up INR, CBC in AM  Alan [name] 01/27/2012,9:49 AM

## 2012-01-27 NOTE — Progress Notes (Signed)
PATIENT DETAILS Name: Alan [name] Age: 79 y.o. Sex: male Date of Birth: 03/08/1931 Admit Date: 01/21/2012 Admitting Physician Eddie North, MD HYQ:[id number] D, MD  Subjective: No major complaints-episode of fever last night.Thought he was going home today  Assessment/Plan: Gangrenous right second toe - Status post ray amputation 1/17 - Continue with antibiotics for 48 hours postop (till 1/120)- per Dr. Edilia Bo - PTOT  Fever 1 episode last night -await blood cultures -does not look toxic -c/w current antibiotics  SIRS (systemic inflammatory response syndrome)  -resolved - Secondary to above  Severe aortic stenosis - Per cardiology-not operative candidate  Atrial fibrillation  Patient was on Coumadin with supratherapeutic INR. - Now on overlapping heparin infusion and Coumadin-pharmacy dosing-INR up at 1.55 today - On metoprolol for rate control  End-stage renal disease on dialysis  - Per nephrology  Anemia  - From end-stage renal  - Per nephrology  Diabetes mellitus  - CBGs stable - Continue with SSI  History of gout - Continue with allopurinol  Coronary artery disease - Stable  Disposition: Remain inpatient-CIR to formally evalute-consult placed  DVT Prophylaxis: Not needed as on heparin infusion  Code Status: Full code   Procedures:  None  CONSULTS:  cardiology, nephrology and vascular surgery  PHYSICAL EXAM: Vital signs in last 24 hours: Filed Vitals:   01/26/12 2221 01/26/12 2344 01/27/12 0051 01/27/12 0504  BP: 106/53  96/39 123/46  Pulse: 104  87 81  Temp: 101.1 F (38.4 C) 99.4 F (37.4 C) 99 F (37.2 C) 98.6 F (37 C)  TempSrc: Oral Oral Oral Oral  Resp: 18  18 18   Height:      Weight: 102.5 kg (225 lb 15.5 oz)     SpO2: 97%  97% 100%    Weight change: 5.6 kg (12 lb 5.5 oz) Body mass index is 30.65 kg/(m^2).   Gen Exam: Awake and alert with clear speech.   Neck: Supple, No JVD.  Chest: B/L Clear.     CVS: S1 S2 Regular, no murmurs.  Abdomen: soft, BS +, non tender, non distended.  Extremities: Status post left BKA, status post right second ray amputation  Neurologic: Non Focal.   Skin: No Rash.   Wounds: N/A.    Intake/Output from previous day:  Intake/Output Summary (Last 24 hours) at 01/27/12 0952 Last data filed at 01/27/12 0700  Gross per 24 hour  Intake 1537.75 ml  Output   3972 ml  Net -2434.25 ml     LAB RESULTS: CBC  Lab 01/27/12 0630 01/26/12 0232 01/25/12 0445 01/24/12 0500 01/23/12 0650 01/22/12 0640 01/21/12 1809  WBC 14.1* 10.7* 12.9* 12.5* 13.0* -- --  HGB 10.9* 8.9* 10.2* 9.7* 10.0* -- --  HCT 34.1* 27.1* 32.3* 29.3* 30.9* -- --  PLT 310 234 263 252 229 -- --  MCV 91.7 91.6 91.5 89.3 90.4 -- --  MCH 29.3 30.1 28.9 29.6 29.2 -- --  MCHC 32.0 32.8 31.6 33.1 32.4 -- --  RDW 13.3 13.2 13.1 12.9 13.0 -- --  LYMPHSABS -- -- -- -- -- 1.4 0.9  MONOABS -- -- -- -- -- 1.2* 1.2*  EOSABS -- -- -- -- -- 0.0 0.0  BASOSABS -- -- -- -- -- 0.0 0.0  BANDABS -- -- -- -- -- -- --    Chemistries   Lab 01/26/12 0733 01/25/12 0445 01/24/12 0741 01/23/12 0650 01/22/12 0640  NA 133* 137 135 133* 135  K 4.1 4.0 4.3 4.7 3.8  CL 92* 95* 93* 92*  91*  CO2 26 29 26 25 29   GLUCOSE 125* 117* 84 135* 105*  BUN 50* 31* 50* 34* 55*  CREATININE 8.22* 6.02* 8.51* 6.34* 9.53*  CALCIUM 8.7 8.9 8.6 8.7 8.4  MG -- -- -- 2.0 --    CBG:  Lab 01/27/12 0808 01/27/12 0055 01/26/12 2336 01/26/12 2226 01/26/12 1645  GLUCAP 137* 114* 95 100* 263*    GFR Estimated Creatinine Clearance: 9.2 ml/min (by C-G formula based on Cr of 8.22).  Coagulation profile  Lab 01/27/12 0630 01/26/12 1050 01/25/12 0445 01/24/12 0500 01/23/12 0650  INR 1.55* 1.40 1.54* 2.92* 3.28*  PROTIME -- -- -- -- --    Cardiac Enzymes No results found for this basename: CK:3,CKMB:3,TROPONINI:3,MYOGLOBIN:3 in the last 168 hours  No components found with this basename: POCBNP:3 No results found for this  basename: DDIMER:2 in the last 72 hours No results found for this basename: HGBA1C:2 in the last 72 hours No results found for this basename: CHOL:2,HDL:2,LDLCALC:2,TRIG:2,CHOLHDL:2,LDLDIRECT:2 in the last 72 hours No results found for this basename: TSH,T4TOTAL,FREET3,T3FREE,THYROIDAB in the last 72 hours No results found for this basename: VITAMINB12:2,FOLATE:2,FERRITIN:2,TIBC:2,IRON:2,RETICCTPCT:2 in the last 72 hours No results found for this basename: LIPASE:2,AMYLASE:2 in the last 72 hours  Urine Studies No results found for this basename: UACOL:2,UAPR:2,USPG:2,UPH:2,UTP:2,UGL:2,UKET:2,UBIL:2,UHGB:2,UNIT:2,UROB:2,ULEU:2,UEPI:2,UWBC:2,URBC:2,UBAC:2,CAST:2,CRYS:2,UCOM:2,BILUA:2 in the last 72 hours  MICROBIOLOGY: Recent Results (from the past 240 hour(s))  CULTURE, BLOOD (ROUTINE X 2)     Status: Normal (Preliminary result)   Collection Time   01/22/12  6:40 AM      Component Value Range Status Comment   Specimen Description BLOOD RIGHT ARM   Final    Special Requests BOTTLES DRAWN AEROBIC AND ANAEROBIC 10CC   Final    Culture  Setup Time 01/22/2012 15:58   Final    Culture     Final    Value:        BLOOD CULTURE RECEIVED NO GROWTH TO DATE CULTURE WILL BE HELD FOR 5 DAYS BEFORE ISSUING A FINAL NEGATIVE REPORT   Report Status PENDING   Incomplete   CULTURE, BLOOD (ROUTINE X 2)     Status: Normal (Preliminary result)   Collection Time   01/22/12  6:45 AM      Component Value Range Status Comment   Specimen Description BLOOD LEFT ANTECUBITAL   Final    Special Requests BOTTLES DRAWN AEROBIC AND ANAEROBIC 10CC   Final    Culture  Setup Time 01/22/2012 15:58   Final    Culture     Final    Value:        BLOOD CULTURE RECEIVED NO GROWTH TO DATE CULTURE WILL BE HELD FOR 5 DAYS BEFORE ISSUING A FINAL NEGATIVE REPORT   Report Status PENDING   Incomplete   MRSA PCR SCREENING     Status: Normal   Collection Time   01/22/12  7:45 AM      Component Value Range Status Comment   MRSA by PCR  NEGATIVE  NEGATIVE Final   SURGICAL PCR SCREEN     Status: Normal   Collection Time   01/24/12  4:26 PM      Component Value Range Status Comment   MRSA, PCR NEGATIVE  NEGATIVE Final    Staphylococcus aureus NEGATIVE  NEGATIVE Final   WOUND CULTURE     Status: Normal (Preliminary result)   Collection Time   01/25/12 11:21 AM      Component Value Range Status Comment   Specimen Description WOUND TOE   Final  Special Requests RT SECOND TOE AMPUTATION SITE PT ON VANC ZOSYN   Final    Gram Stain     Final    Value: ABUNDANT WBC PRESENT, PREDOMINANTLY PMN     NO SQUAMOUS EPITHELIAL CELLS SEEN     MODERATE GRAM POSITIVE COCCI     IN PAIRS   Culture Culture reincubated for better growth   Final    Report Status PENDING   Incomplete     RADIOLOGY STUDIES/RESULTS: Dg Chest Port 1 View  01/23/2012  *RADIOLOGY REPORT*  Clinical Data: Fever.  Increasing cough and shortness of breath.  PORTABLE CHEST - 1 VIEW  Comparison: 07/18/2011  Findings: There is chronic cardiomegaly with tortuosity of the thoracic aorta.  There is a prominent left pericardial fat pad. There are no infiltrates or effusions.  No pulmonary edema.  No acute osseous abnormality.  Evidence of prior CABG.  IMPRESSION: Chronic cardiomegaly.  No acute abnormality.   Original Report Authenticated By: Francene Boyers, M.D.     MEDICATIONS: Scheduled Meds:    . allopurinol  150 mg Oral Daily  . ampicillin-sulbactam (UNASYN) IV  1.5 g Intravenous Q12H  . calcium carbonate  1 tablet Oral TID WC  . darbepoetin (ARANESP) injection - DIALYSIS  150 mcg Intravenous Q Tue-HD  . ferric gluconate (FERRLECIT/NULECIT) IV  125 mg Intravenous Q Thu-HD  . insulin aspart  0-9 Units Subcutaneous TID WC  . metoprolol tartrate  12.5 mg Oral BID  . multivitamin  1 tablet Oral QHS  . paricalcitol  4 mcg Intravenous Q T,Th,Sa-HD  . simvastatin  40 mg Oral QHS  . sodium chloride  3 mL Intravenous Q12H  . vancomycin  1,000 mg Intravenous Q T,Th,Sa-HD    . Warfarin - Pharmacist Dosing Inpatient   Does not apply q1800   Continuous Infusions:    . sodium chloride 20 mL/hr (01/27/12 0011)  . heparin 1,700 Units/hr (01/27/12 0010)   PRN Meds:.acetaminophen, acetaminophen, oxyCODONE-acetaminophen  Antibiotics: Anti-infectives     Start     Dose/Rate Route Frequency Ordered Stop   01/22/12 1800   ampicillin-sulbactam (UNASYN) 1.5 g in sodium chloride 0.9 % 50 mL IVPB        1.5 g 100 mL/hr over 30 Minutes Intravenous Every 12 hours 01/22/12 0310     01/22/12 1200   vancomycin (VANCOCIN) IVPB 1000 mg/200 mL premix        1,000 mg 200 mL/hr over 60 Minutes Intravenous Every T-Th-Sa (Hemodialysis) 01/22/12 0310     01/22/12 0400   vancomycin (VANCOCIN) 1,250 mg in sodium chloride 0.9 % 250 mL IVPB        1,250 mg 166.7 mL/hr over 90 Minutes Intravenous  Once 01/22/12 0310 01/22/12 0552   01/22/12 0330   ampicillin-sulbactam (UNASYN) 1.5 g in sodium chloride 0.9 % 50 mL IVPB        1.5 g 100 mL/hr over 30 Minutes Intravenous  Once 01/22/12 0310 01/22/12 0405   01/22/12 0100   vancomycin (VANCOCIN) IVPB 1000 mg/200 mL premix        1,000 mg 200 mL/hr over 60 Minutes Intravenous  Once 01/22/12 0054 01/22/12 0226           Jeoffrey Massed, MD  [facility] Pager:[phone]  If 7PM-7AM, please contact night-coverage [email or web address] Password [email or web address] 01/27/2012, 9:52 AM   LOS: 6 days

## 2012-01-27 NOTE — Progress Notes (Signed)
Noted, Thank you.  Alan [name] Pager: [phone] 01/27/2012, 9:04 PM

## 2012-01-27 NOTE — Progress Notes (Signed)
   TELEMETRY: Reviewed telemetry pt in atrial flutter with controlled rate: Filed Vitals:   01/26/12 2221 01/26/12 2344 01/27/12 0051 01/27/12 0504  BP: 106/53  96/39 123/46  Pulse: 104  87 81  Temp: 101.1 F (38.4 C) 99.4 F (37.4 C) 99 F (37.2 C) 98.6 F (37 C)  TempSrc: Oral Oral Oral Oral  Resp: 18  18 18   Height:      Weight: 225 lb 15.5 oz (102.5 kg)     SpO2: 97%  97% 100%    Intake/Output Summary (Last 24 hours) at 01/27/12 0940 Last data filed at 01/27/12 0700  Gross per 24 hour  Intake 1537.75 ml  Output   3972 ml  Net -2434.25 ml    SUBJECTIVE  Denies any chest pain or dyspnea.  LABS: Basic Metabolic Panel:  Basename 01/26/12 0733 01/25/12 0445  NA 133* 137  K 4.1 4.0  CL 92* 95*  CO2 26 29  GLUCOSE 125* 117*  BUN 50* 31*  CREATININE 8.22* 6.02*  CALCIUM 8.7 8.9  MG -- --  PHOS 4.0 --   Liver Function Tests:  Basename 01/26/12 0733  AST --  ALT --  ALKPHOS --  BILITOT --  PROT --  ALBUMIN 2.4*   No results found for this basename: LIPASE:2,AMYLASE:2 in the last 72 hours CBC:  Basename 01/27/12 0630 01/26/12 0232  WBC 14.1* 10.7*  NEUTROABS -- --  HGB 10.9* 8.9*  HCT 34.1* 27.1*  MCV 91.7 91.6  PLT 310 234     Radiology/Studies:  Dg Chest Port 1 View  01/23/2012  *RADIOLOGY REPORT*  Clinical Data: Fever.  Increasing cough and shortness of breath.  PORTABLE CHEST - 1 VIEW  Comparison: 07/18/2011  Findings: There is chronic cardiomegaly with tortuosity of the thoracic aorta.  There is a prominent left pericardial fat pad. There are no infiltrates or effusions.  No pulmonary edema.  No acute osseous abnormality.  Evidence of prior CABG.  IMPRESSION: Chronic cardiomegaly.  No acute abnormality.   Original Report Authenticated By: Francene Boyers, M.D.     PHYSICAL EXAM General: Well developed, elderly, in no acute distress. Head: Normal Neck: Negative for carotid bruits. JVD not elevated. Lungs: Clear bilaterally to auscultation without  wheezes, rales, or rhonchi. Breathing is unlabored. Heart: IRRR S1 S2 with grade 2-3/6 systolic murmur of AS  Abdomen: Soft, non-tender, non-distended with normoactive bowel sounds. No hepatomegaly. No rebound/guarding. No obvious abdominal masses. Extremities: left BKA, 1+ edema on right. Right foot wrapped. Neuro: Alert and oriented X 3. Moves all extremities spontaneously. Psych:  Responds to questions appropriately with a normal affect.  ASSESSMENT AND PLAN: 1. S/p right 2nd toe amputation - 1/17 2. Moderate to severe aortic stenosis- asymptomatic. We will obtain follow up Echo this admission. Patient is a poor surgical candidate for AVR.  3. ESRD on dialysis - examined in dialysis 4. Chronic anemia 5. Atrial fibrillation/flutter- rate well controlled. Resume coumadin per VVS. 6. CAD s/p CABG   Principal Problem:  *SIRS (systemic inflammatory response syndrome) Active Problems:  Aortic stenosis  CAD (coronary artery disease)  End stage renal disease on dialysis  Diabetes mellitus  Atherosclerosis of native arteries of the extremities with ulceration(440.23)  Wound infection  Unilateral complete BKA  Atrial fibrillation  Pre-operative cardiovascular examination   No new cardiac issues.  Will sign off.  Call for questions.   Signed, Elyn Aquas. MD,FACC 01/27/2012 9:40 AM

## 2012-01-28 ENCOUNTER — Encounter (HOSPITAL_COMMUNITY): Payer: Self-pay | Admitting: Vascular Surgery

## 2012-01-28 ENCOUNTER — Inpatient Hospital Stay (HOSPITAL_COMMUNITY)
Admission: RE | Admit: 2012-01-28 | Discharge: 2012-02-06 | DRG: 945 | Disposition: A | Discharge status: Home Health | Payer: MEDICARE | Source: Intra-hospital | Attending: Physical Medicine & Rehabilitation | Admitting: Physical Medicine & Rehabilitation

## 2012-01-28 DIAGNOSIS — S98139A Complete traumatic amputation of one unspecified lesser toe, initial encounter: Secondary | ICD-10-CM

## 2012-01-28 DIAGNOSIS — N186 End stage renal disease: Secondary | ICD-10-CM

## 2012-01-28 DIAGNOSIS — S88119A Complete traumatic amputation at level between knee and ankle, unspecified lower leg, initial encounter: Secondary | ICD-10-CM

## 2012-01-28 DIAGNOSIS — N200 Calculus of kidney: Secondary | ICD-10-CM | POA: Diagnosis present

## 2012-01-28 DIAGNOSIS — Z951 Presence of aortocoronary bypass graft: Secondary | ICD-10-CM

## 2012-01-28 DIAGNOSIS — Z5189 Encounter for other specified aftercare: Principal | ICD-10-CM

## 2012-01-28 DIAGNOSIS — Z89519 Acquired absence of unspecified leg below knee: Secondary | ICD-10-CM

## 2012-01-28 DIAGNOSIS — I4891 Unspecified atrial fibrillation: Secondary | ICD-10-CM | POA: Diagnosis present

## 2012-01-28 DIAGNOSIS — Z87891 Personal history of nicotine dependence: Secondary | ICD-10-CM

## 2012-01-28 DIAGNOSIS — Z992 Dependence on renal dialysis: Secondary | ICD-10-CM

## 2012-01-28 DIAGNOSIS — I2510 Atherosclerotic heart disease of native coronary artery without angina pectoris: Secondary | ICD-10-CM | POA: Diagnosis present

## 2012-01-28 DIAGNOSIS — E785 Hyperlipidemia, unspecified: Secondary | ICD-10-CM | POA: Diagnosis present

## 2012-01-28 DIAGNOSIS — I120 Hypertensive chronic kidney disease with stage 5 chronic kidney disease or end stage renal disease: Secondary | ICD-10-CM | POA: Diagnosis present

## 2012-01-28 DIAGNOSIS — E1149 Type 2 diabetes mellitus with other diabetic neurological complication: Secondary | ICD-10-CM | POA: Diagnosis present

## 2012-01-28 DIAGNOSIS — I70269 Atherosclerosis of native arteries of extremities with gangrene, unspecified extremity: Secondary | ICD-10-CM | POA: Diagnosis present

## 2012-01-28 DIAGNOSIS — E1142 Type 2 diabetes mellitus with diabetic polyneuropathy: Secondary | ICD-10-CM | POA: Diagnosis present

## 2012-01-28 DIAGNOSIS — D649 Anemia, unspecified: Secondary | ICD-10-CM | POA: Diagnosis present

## 2012-01-28 DIAGNOSIS — D72829 Elevated white blood cell count, unspecified: Secondary | ICD-10-CM | POA: Diagnosis present

## 2012-01-28 LAB — CBC
HCT: 31.3 % — ABNORMAL LOW (ref 39.0–52.0)
Hemoglobin: 10 g/dL — ABNORMAL LOW (ref 13.0–17.0)
MCH: 29 pg (ref 26.0–34.0)
MCHC: 31.9 g/dL (ref 30.0–36.0)
RBC: 3.45 MIL/uL — ABNORMAL LOW (ref 4.22–5.81)
WBC: 13.7 10*3/uL — ABNORMAL HIGH (ref 4.0–10.5)

## 2012-01-28 LAB — CULTURE, BLOOD (ROUTINE X 2)

## 2012-01-28 LAB — PROTIME-INR
INR: 2.18 — ABNORMAL HIGH (ref 0.00–1.49)
Prothrombin Time: 23.3 seconds — ABNORMAL HIGH (ref 11.6–15.2)

## 2012-01-28 LAB — HEPARIN LEVEL (UNFRACTIONATED)
Heparin Unfractionated: 0.35 IU/mL (ref 0.30–0.70)
Heparin Unfractionated: <0.1 IU/mL — ABNORMAL LOW (ref 0.30–0.70)

## 2012-01-28 LAB — WOUND CULTURE

## 2012-01-28 LAB — GLUCOSE, CAPILLARY: Glucose-Capillary: 133 mg/dL — ABNORMAL HIGH (ref 70–99)

## 2012-01-28 MED ORDER — DARBEPOETIN ALFA-POLYSORBATE 150 MCG/0.3ML IJ SOLN
150.0000 ug | INTRAMUSCULAR | Status: DC
  Administered 2012-01-29: 150 ug via INTRAVENOUS
  Filled 2012-01-28 (×2): qty 0.3

## 2012-01-28 MED ORDER — OXYCODONE-ACETAMINOPHEN 5-325 MG PO TABS: 1.0000 | ORAL_TABLET | ORAL | Status: DC | PRN

## 2012-01-28 MED ORDER — SIMVASTATIN 40 MG PO TABS
40.0000 mg | ORAL_TABLET | Freq: Every day | ORAL | Status: DC
  Administered 2012-01-28 – 2012-02-05 (×9): 40 mg via ORAL
  Filled 2012-01-28 (×10): qty 1

## 2012-01-28 MED ORDER — RENA-VITE PO TABS
1.0000 | ORAL_TABLET | Freq: Every day | ORAL | Status: DC
  Administered 2012-01-28 – 2012-02-05 (×9): 1 via ORAL
  Filled 2012-01-28 (×10): qty 1

## 2012-01-28 MED ORDER — WARFARIN SODIUM 2 MG PO TABS
2.0000 mg | ORAL_TABLET | Freq: Once | ORAL | Status: AC
  Administered 2012-01-28: 2 mg via ORAL
  Filled 2012-01-28: qty 1

## 2012-01-28 MED ORDER — SORBITOL 70 % SOLN
30.0000 mL | Freq: Every day | Status: DC | PRN
  Administered 2012-01-29: 30 mL via ORAL
  Filled 2012-01-28 (×3): qty 30

## 2012-01-28 MED ORDER — ONDANSETRON HCL 4 MG/2ML IJ SOLN: 4.0000 mg | Freq: Four times a day (QID) | INTRAMUSCULAR | Status: DC | PRN

## 2012-01-28 MED ORDER — ONDANSETRON HCL 4 MG PO TABS: 4.0000 mg | ORAL_TABLET | Freq: Four times a day (QID) | ORAL | Status: DC | PRN

## 2012-01-28 MED ORDER — WARFARIN SODIUM 2 MG PO TABS
2.0000 mg | ORAL_TABLET | Freq: Once | ORAL | Status: DC
  Filled 2012-01-28: qty 1

## 2012-01-28 MED ORDER — WARFARIN - PHARMACIST DOSING INPATIENT
Freq: Every day | Status: DC
  Administered 2012-02-01: 18:00:00

## 2012-01-28 MED ORDER — SODIUM CHLORIDE 0.9 % IV SOLN
125.0000 mg | INTRAVENOUS | Status: DC
  Administered 2012-01-31: 125 mg via INTRAVENOUS
  Filled 2012-01-28 (×3): qty 10

## 2012-01-28 MED ORDER — ALLOPURINOL 150 MG HALF TABLET
150.0000 mg | ORAL_TABLET | Freq: Every day | ORAL | Status: DC
Start: 2012-01-29 — End: 2012-02-06
  Administered 2012-01-29 – 2012-02-06 (×9): 150 mg via ORAL
  Filled 2012-01-28 (×11): qty 1

## 2012-01-28 MED ORDER — METOPROLOL TARTRATE 12.5 MG HALF TABLET
12.5000 mg | ORAL_TABLET | Freq: Two times a day (BID) | ORAL | Status: DC
  Administered 2012-01-28 – 2012-02-05 (×15): 12.5 mg via ORAL
  Filled 2012-01-28 (×20): qty 1

## 2012-01-28 MED ORDER — INSULIN ASPART 100 UNIT/ML ~~LOC~~ SOLN
0.0000 [IU] | Freq: Three times a day (TID) | SUBCUTANEOUS | Status: DC
  Administered 2012-01-28: 1 [IU] via SUBCUTANEOUS
  Administered 2012-01-29 – 2012-01-30 (×2): 2 [IU] via SUBCUTANEOUS
  Administered 2012-01-30: 1 [IU] via SUBCUTANEOUS
  Administered 2012-01-30: 2 [IU] via SUBCUTANEOUS
  Administered 2012-01-31: 1 [IU] via SUBCUTANEOUS
  Administered 2012-01-31: 3 [IU] via SUBCUTANEOUS
  Administered 2012-02-01 (×2): 2 [IU] via SUBCUTANEOUS
  Administered 2012-02-01: 1 [IU] via SUBCUTANEOUS
  Administered 2012-02-02: 3 [IU] via SUBCUTANEOUS
  Administered 2012-02-03: 2 [IU] via SUBCUTANEOUS
  Administered 2012-02-03 – 2012-02-04 (×2): 1 [IU] via SUBCUTANEOUS
  Administered 2012-02-04 – 2012-02-05 (×2): 2 [IU] via SUBCUTANEOUS

## 2012-01-28 MED ORDER — WARFARIN SODIUM 5 MG PO TABS
5.0000 mg | ORAL_TABLET | Freq: Once | ORAL | Status: DC
  Filled 2012-01-28: qty 1

## 2012-01-28 MED ORDER — DOXERCALCIFEROL 2.5 MCG PO CAPS: 4.0000 ug | ORAL_CAPSULE | ORAL | Status: DC

## 2012-01-28 MED ORDER — CALCIUM CARBONATE ANTACID 500 MG PO CHEW
1.0000 | CHEWABLE_TABLET | Freq: Three times a day (TID) | ORAL | Status: DC
  Administered 2012-01-28 – 2012-02-06 (×23): 200 mg via ORAL
  Filled 2012-01-28 (×29): qty 1

## 2012-01-28 MED ORDER — ACETAMINOPHEN 325 MG PO TABS: 325.0000 mg | ORAL_TABLET | ORAL | Status: DC | PRN

## 2012-01-28 NOTE — Discharge Summary (Signed)
PATIENT DETAILS Name: Alan [name] Age: 79 y.o. Sex: male Date of Birth: March 19, 1931 MRN: [id number]. Admit Date: 01/21/2012 Admitting Physician: No admitting provider for patient encounter. GNF:[id number],QIONGEX D, MD  Recommendations for Outpatient Follow-up:  1. Will need followup with primary care practitioner and primary cardiologist on discharge 2. Patient had fever on 1/18, blood cultures were drawn-if they continue to be negative, consider discontinuing all antibiotics 3. Will need hemodialysis per nephrology 4. PT/INR being managed by pharmacy  PRIMARY DISCHARGE DIAGNOSIS:  Principal Problem:  *SIRS (systemic inflammatory response syndrome) Active Problems:  Aortic stenosis  CAD (coronary artery disease)  End stage renal disease on dialysis  Diabetes mellitus  Atherosclerosis of native arteries of the extremities with ulceration(440.23)  Wound infection  Unilateral complete BKA  Atrial fibrillation  Pre-operative cardiovascular examination      PAST MEDICAL HISTORY: Past Medical History  Diagnosis Date  . Diabetes mellitus   . Gout   . Hypertension   . Hyperlipidemia   . Chronic kidney disease     Started dialysis October 2012  . Arthritis   . Prostate cancer     s/p seed implant  . Cataract     BILATERAL-BEEN REMOVED  . Neuromuscular disorder     DABETIC NEUROPATHY-LOWER EXTREMITY  . Atrial fibrillation     on coumadin  . Aortic stenosis     moderate to severe, not felt to be a surgical candidate  . Anemia     secondary to end stage renal disease  . Coronary artery disease     remote CABG in 1996; last stress test in 2005; last cath 2005  . Diabetic neuropathy   . Edema   . PAD (peripheral artery disease)   . Diabetic retinopathy(362.0)   . Chronic anticoagulation     on coumadin  . Nephrolithiasis     prior history of percutaneous nephrostomy  . Shortness of breath     DISCHARGE MEDICATIONS:   Medication List     As of 01/28/2012  10:59 AM    TAKE these medications         allopurinol 300 MG tablet   Commonly known as: ZYLOPRIM   Take 150 mg by mouth daily.      aspirin EC 81 MG tablet   Take 81 mg by mouth daily.      calcium carbonate 500 MG chewable tablet   Commonly known as: TUMS - dosed in mg elemental calcium   Chew 1 tablet by mouth 3 (three) times daily with meals. Phosphorous binder      CORICIDIN D PO   Take 1 tablet by mouth at bedtime as needed. For cold symptoms      estazolam 2 MG tablet   Commonly known as: PROSOM   Take 1 mg by mouth at bedtime as needed. For sleep      glimepiride 4 MG tablet   Commonly known as: AMARYL   Take 2 mg by mouth daily.      metoprolol tartrate 25 MG tablet   Commonly known as: LOPRESSOR   Take 37.5 mg by mouth 2 (two) times daily.      simvastatin 40 MG tablet   Commonly known as: ZOCOR   Take 40 mg by mouth at bedtime.      warfarin 5 MG tablet   Commonly known as: COUMADIN   Take 2.5-5 mg by mouth daily. Takes 5 mg every day of the week except on Fridays, he takes 2.5 mg  BRIEF HPI:  See H&P, Labs, Consult and Test reports for all details in brief, patient is a 79 year old African American male with a history of end-stage renal disease on hemodialysis, hypertension, diabetes, prior BKA who was admitted to the hospital as he was referred from the wound clinic for a gangrenous-looking right second toe.  CONSULTATIONS:   Nephrology Cardiology CIR  PERTINENT RADIOLOGIC STUDIES: Dg Chest Port 1 View  01/23/2012  *RADIOLOGY REPORT*  Clinical Data: Fever.  Increasing cough and shortness of breath.  PORTABLE CHEST - 1 VIEW  Comparison: 07/18/2011  Findings: There is chronic cardiomegaly with tortuosity of the thoracic aorta.  There is a prominent left pericardial fat pad. There are no infiltrates or effusions.  No pulmonary edema.  No acute osseous abnormality.  Evidence of prior CABG.  IMPRESSION: Chronic cardiomegaly.  No acute abnormality.    Original Report Authenticated By: Francene Boyers, M.D.      PERTINENT LAB RESULTS: CBC:  Basename 01/28/12 0822 01/27/12 0630  WBC 13.7* 14.1*  HGB 10.0* 10.9*  HCT 31.3* 34.1*  PLT 310 310   CMET CMP     Component Value Date/Time   NA 133* 01/26/2012 0733   K 4.1 01/26/2012 0733   CL 92* 01/26/2012 0733   CO2 26 01/26/2012 0733   GLUCOSE 125* 01/26/2012 0733   BUN 50* 01/26/2012 0733   CREATININE 8.22* 01/26/2012 0733   CALCIUM 8.7 01/26/2012 0733   CALCIUM 8.8 10/27/2010 0734   PROT 7.8 01/21/2012 1809   ALBUMIN 2.4* 01/26/2012 0733   AST 19 01/21/2012 1809   ALT 12 01/21/2012 1809   ALKPHOS 53 01/21/2012 1809   BILITOT 0.7 01/21/2012 1809   GFRNONAA 5* 01/26/2012 0733   GFRAA 6* 01/26/2012 0733    GFR Estimated Creatinine Clearance: 9.2 ml/min (by C-G formula based on Cr of 8.22). No results found for this basename: LIPASE:2,AMYLASE:2 in the last 72 hours No results found for this basename: CKTOTAL:3,CKMB:3,CKMBINDEX:3,TROPONINI:3 in the last 72 hours No components found with this basename: POCBNP:3 No results found for this basename: DDIMER:2 in the last 72 hours No results found for this basename: HGBA1C:2 in the last 72 hours No results found for this basename: CHOL:2,HDL:2,LDLCALC:2,TRIG:2,CHOLHDL:2,LDLDIRECT:2 in the last 72 hours No results found for this basename: TSH,T4TOTAL,FREET3,T3FREE,THYROIDAB in the last 72 hours No results found for this basename: VITAMINB12:2,FOLATE:2,FERRITIN:2,TIBC:2,IRON:2,RETICCTPCT:2 in the last 72 hours Coags:  Basename 01/28/12 0822 01/27/12 0630  INR 2.18* 1.55*   Microbiology: Recent Results (from the past 240 hour(s))  CULTURE, BLOOD (ROUTINE X 2)     Status: Normal   Collection Time   01/22/12  6:40 AM      Component Value Range Status Comment   Specimen Description BLOOD RIGHT ARM   Final    Special Requests BOTTLES DRAWN AEROBIC AND ANAEROBIC 10CC   Final    Culture  Setup Time 01/22/2012 15:58   Final    Culture NO GROWTH 5  DAYS   Final    Report Status 01/28/2012 FINAL   Final   CULTURE, BLOOD (ROUTINE X 2)     Status: Normal   Collection Time   01/22/12  6:45 AM      Component Value Range Status Comment   Specimen Description BLOOD LEFT ANTECUBITAL   Final    Special Requests BOTTLES DRAWN AEROBIC AND ANAEROBIC 10CC   Final    Culture  Setup Time 01/22/2012 15:58   Final    Culture NO GROWTH 5 DAYS   Final  Report Status 01/28/2012 FINAL   Final   MRSA PCR SCREENING     Status: Normal   Collection Time   01/22/12  7:45 AM      Component Value Range Status Comment   MRSA by PCR NEGATIVE  NEGATIVE Final   SURGICAL PCR SCREEN     Status: Normal   Collection Time   01/24/12  4:26 PM      Component Value Range Status Comment   MRSA, PCR NEGATIVE  NEGATIVE Final    Staphylococcus aureus NEGATIVE  NEGATIVE Final   WOUND CULTURE     Status: Normal   Collection Time   01/25/12 11:21 AM      Component Value Range Status Comment   Specimen Description WOUND TOE   Final    Special Requests RT SECOND TOE AMPUTATION SITE PT ON VANC ZOSYN   Final    Gram Stain     Final    Value: ABUNDANT WBC PRESENT, PREDOMINANTLY PMN     NO SQUAMOUS EPITHELIAL CELLS SEEN     MODERATE GRAM POSITIVE COCCI     IN PAIRS   Culture     Final    Value: MODERATE STAPHYLOCOCCUS AUREUS     Note: RIFAMPIN AND GENTAMICIN SHOULD NOT BE USED AS SINGLE DRUGS FOR TREATMENT OF STAPH INFECTIONS. This organism DOES NOT demonstrate inducible Clindamycin resistance in vitro.   Report Status 01/28/2012 FINAL   Final    Organism ID, Bacteria STAPHYLOCOCCUS AUREUS   Final   CULTURE, BLOOD (ROUTINE X 2)     Status: Normal (Preliminary result)   Collection Time   01/27/12 12:35 AM      Component Value Range Status Comment   Specimen Description BLOOD LEFT HAND   Final    Special Requests BOTTLES DRAWN AEROBIC ONLY 3CC   Final    Culture  Setup Time 01/27/2012 12:13   Final    Culture     Final    Value:        BLOOD CULTURE RECEIVED NO GROWTH TO  DATE CULTURE WILL BE HELD FOR 5 DAYS BEFORE ISSUING A FINAL NEGATIVE REPORT   Report Status PENDING   Incomplete   CULTURE, BLOOD (ROUTINE X 2)     Status: Normal (Preliminary result)   Collection Time   01/27/12 12:45 AM      Component Value Range Status Comment   Specimen Description BLOOD LEFT HAND   Final    Special Requests BOTTLES DRAWN AEROBIC ONLY 3CC   Final    Culture  Setup Time 01/27/2012 12:13   Final    Culture     Final    Value:        BLOOD CULTURE RECEIVED NO GROWTH TO DATE CULTURE WILL BE HELD FOR 5 DAYS BEFORE ISSUING A FINAL NEGATIVE REPORT   Report Status PENDING   Incomplete      BRIEF HOSPITAL COURSE:  Gangrenous right second toe  - Patient was admitted, started on empiric vancomycin Unasyn, [facility] was consulted. - Status post ray amputation 1/17  - Plan was to continue with antibiotics for 48 hours postop (till 1/120)- per Dr. Edilia Bo- however patient had a fever on 1/18, blood cultures have been drawn, these are negative so far, 1 cultures are positive for MSSA, would continue antibiotics for now till patient has been afebrile for the next one or 2 days - Will continue with Vanco and stop Unasyn  - Dr. Edilia Bo from [facility] has been following  Post  operative Fever  1 episode on 1/18 - blood cultures negative so far  -does not look toxic  -c/w current antibiotics- as noted above  SIRS (systemic inflammatory response syndrome)  -resolved  - Secondary to above  Severe aortic stenosis  - Per cardiology-not operative candidate - Preop clearance was provided by cardiology  Atrial fibrillation  - Patient had supratherapeutic INR on admission, he did receive one dose of vitamin K 1 mg. Once his INR was optimal, patient was taken for amputation. -Postoperatively he has been started on heparin and Coumadin, since INR is more than 2 heparin has now been discontinued and the patient continues to be on Coumadin. This is being managed by the pharmacy.  End-stage renal  disease on dialysis  - Per nephrology   Anemia  - From end-stage renal  - Per nephrology   Diabetes - CBGs are stable, continue SSI - Resume Amaryl on discharge.  History of gout  - Continue with allopurinol   Coronary artery disease  - Stable    TODAY-DAY OF DISCHARGE:  Subjective:   Marlene Lard today has no headache,no chest abdominal pain,no new weakness tingling or numbness, feels much better wants to go home today.   Objective:   Blood pressure 100/64, pulse 107, temperature 98.9 F (37.2 C), temperature source Oral, resp. rate 18, height 6' (1.829 m), weight 103.692 kg (228 lb 9.6 oz), SpO2 94.00%.  Intake/Output Summary (Last 24 hours) at 01/28/12 1059 Last data filed at 01/28/12 0900  Gross per 24 hour  Intake   1441 ml  Output      0 ml  Net   1441 ml    Exam Awake Alert, Oriented *3, No new F.N deficits, Normal affect [place].AT,PERRAL Supple Neck,No JVD, No cervical lymphadenopathy appriciated.  Symmetrical Chest wall movement, Good air movement bilaterally, CTAB RRR,No Gallops,Rubs or new Murmurs, No Parasternal Heave +ve B.Sounds, Abd Soft, Non tender, No organomegaly appriciated, No rebound -guarding or rigidity. No Cyanosis, Clubbing or edema, No new Rash or bruise  DISCHARGE CONDITION: Stable  DISPOSITION: CIR  DISCHARGE INSTRUCTIONS:    Activity:  As tolerated with Full fall precautions use walker/cane & assistance as needed  Diet recommendation: Diabetic Diet Renal diet  Total Time spent on discharge equals 45 minutes.  SignedJeoffrey Massed 01/28/2012 10:59 AM

## 2012-01-28 NOTE — Progress Notes (Signed)
Physical Therapy Treatment Patient Details Name: CAROLD EISNER MRN: [id number] DOB: 1931-05-02 Today's Date: 01/28/2012 Time: 0830-0904 PT Time Calculation (min): 34 min  PT Assessment / Plan / Recommendation Comments on Treatment Session  Able to transfer OOB today via lateral scoot with sliding board and armrest removed; Much improved activity tolerance; Spoke with Peyton Najjar, Estate agent at [employer] re: small area of pressure lateral aspect of L patella -- he will try to see pt in hospital today (Thanks!)    Follow Up Recommendations  CIR     Does the patient have the potential to tolerate intense rehabilitation     Barriers to Discharge        Equipment Recommendations  None recommended by PT    Recommendations for Other Services OT consult  Frequency Min 3X/week   Plan Discharge plan remains appropriate;Frequency remains appropriate    Precautions / Restrictions Precautions Precautions: Fall Required Braces or Orthoses: Other Brace/Splint Other Brace/Splint: Post-op shoe Right foot (worthconsidering a Darco) Restrictions Weight Bearing Restrictions:  (No WBing precautions for Right foot found on chart) Other Position/Activity Restrictions: no Wb restriction orders in chart, pt. wearing post op shoe in bed   Pertinent Vitals/Pain Does not report pain when asked    Mobility  Bed Mobility Bed Mobility: Supine to Sit;Sitting - Scoot to Edge of Bed Supine to Sit: 3: Mod assist;With rails;HOB elevated Sitting - Scoot to [place] of Bed: 4: Min assist Details for Bed Mobility Assistance: incr time, assist to guide hips using bed pad; Used rails and mod handheld assist to elevate trunk from bed; Initially with posterior lean once in sitting position; able to correct with cues Transfers Transfers: Sit to Stand;Stand to Sit;Lateral/Scoot Transfers Sit to Stand: 1: +2 Total assist;With upper extremity assist;From bed;From chair/3-in-1;With armrests Sit to Stand: Patient Percentage:  50% Stand to Sit: 1: +2 Total assist;With upper extremity assist;With armrests;To bed;To chair/3-in-1 Stand to Sit: Patient Percentage: 50% Lateral/Scoot Transfers: 3: Mod assist;With armrests removed;With slide board Details for Transfer Assistance: Cues for intiiation, hand placement, & technique.  (A) to achieve standing, anterior translation of trunk over BOS, balance, & controlled descent; Required incr time to work on extending hips and trunk, and still didn't quite acheive fully upright standing    Exercises     PT Diagnosis:    PT Problem List:   PT Treatment Interventions:     PT Goals Acute Rehab PT Goals Time For Goal Achievement: 01/29/12 Potential to Achieve Goals: Good Pt will go Sit to Stand: with min assist PT Goal: Sit to Stand - Progress: Progressing toward goal (Very slowly) Pt will go Stand to Sit: with min assist PT Goal: Stand to Sit - Progress: Progressing toward goal Pt will Transfer Bed to Chair/Chair to Bed: with min assist PT Transfer Goal: Bed to Chair/Chair to Bed - Progress: Progressing toward goal  Visit Information  Last PT Received On: 01/28/12 Assistance Needed: +2 (For OOB/standing)    Subjective Data  Subjective: Agreeable to OOB   Cognition  Overall Cognitive Status: Appears within functional limits for tasks assessed/performed Arousal/Alertness: Awake/alert Orientation Level: Appears intact for tasks assessed Behavior During Session: [facility] for tasks performed Cognition - Other Comments: Somewhat slow to answer questions    Balance     End of Session PT - End of Session Equipment Utilized During Treatment: Gait belt;Other (comment) (Sliding board) Activity Tolerance: Patient tolerated treatment well Patient left: in chair;with call bell/phone within reach Nurse Communication: Mobility status   GP  Van Clines [place], [place] [phone]  01/28/2012, 10:16 AM

## 2012-01-28 NOTE — Progress Notes (Signed)
ANTICOAGULATION CONSULT NOTE - Follow Up Consult  Pharmacy Consult for Heparin + Warfarin Indication: atrial fibrillation  Allergies  Allergen Reactions  . Ancef (Cefazolin) Rash    Patient Measurements: Height: 6' (182.9 cm) Weight: 228 lb 9.6 oz (103.692 kg) IBW/kg (Calculated) : 77.6  Heparin Dosing Weight: 99 kg  Vital Signs: Temp: 98.9 F (37.2 C) (01/20 0920) Temp src: Oral (01/20 0920) BP: 100/64 mmHg (01/20 0920) Pulse Rate: 107  (01/20 0920)  Labs:  Alvira Philips 01/28/12 0822 01/28/12 0022 01/27/12 1731 01/27/12 0630 01/26/12 1050 01/26/12 0733 01/26/12 0232  HGB 10.0* -- -- 10.9* -- -- --  HCT 31.3* -- -- 34.1* -- -- 27.1*  PLT 310 -- -- 310 -- -- 234  APTT -- -- -- -- -- -- --  LABPROT 23.3* -- -- 18.1* 16.8* -- --  INR 2.18* -- -- 1.55* 1.40 -- --  HEPARINUNFRC 0.12* 0.35 0.24* -- -- -- --  CREATININE -- -- -- -- -- 8.22* --  CKTOTAL -- -- -- -- -- -- --  CKMB -- -- -- -- -- -- --  TROPONINI -- -- -- -- -- -- --    Estimated Creatinine Clearance: 9.2 ml/min (by C-G formula based on Cr of 8.22).   Assessment: 79 y.o. M on chronic warfarin PTA for hx Afib. Warfarin was held this admission for a toe amputation and resumed on 1/18 along with a heparin bridge. Heparin was stopped this morning after the patient pulled out his IV site -- which resulted in some bleeding. INR has jumped up today and is in the therapeutic range. Given the large jump in INR and some bleeding this morning -- will go ahead and discontinue the heparin bridge today. Hgb/Hct slight drop.  Goal of Therapy:  INR 2-3 Monitor platelets by anticoagulation protocol: Yes   Plan:  1. Warfarin 2 mg x 1 dose at 1800 today  2. Will continue to monitor for any signs/symptoms of bleeding and will follow up with PT/INR in the a.m.   Georgina Pillion, PharmD, BCPS Clinical Pharmacist Pager: [phone] 01/28/2012 11:48 AM

## 2012-01-28 NOTE — Progress Notes (Signed)
Subjective:  No complaints Plans to go to rehab this afternoon Says still feels his toe  Objective:    Vital signs in last 24 hours: Filed Vitals:   01/27/12 1800 01/27/12 2113 01/28/12 0434 01/28/12 0920  BP: 113/65 118/64 120/66 100/64  Pulse: 85 86 80 107  Temp: 99.5 F (37.5 C) 99 F (37.2 C) 98.6 F (37 C) 98.9 F (37.2 C)  TempSrc: Oral Oral Oral Oral  Resp: 18 18 18 18   Height:      Weight:  103.692 kg (228 lb 9.6 oz)    SpO2: 97% 97% 94% 94%   Weight change: -6.608 kg (-14 lb 9.1 oz)  Intake/Output Summary (Last 24 hours) at 01/28/12 1314 Last data filed at 01/28/12 0900  Gross per 24 hour  Intake   1321 ml  Output      0 ml  Net   1321 ml    Physical Exam:  Blood pressure 100/64, pulse 107, temperature 98.9 F (37.2 C), temperature source Oral, resp. rate 18, height 6' (1.829 m), weight 103.692 kg (228 lb 9.6 oz), SpO2 94.00%. Up in the chair Lungs clear to A  S1S2 no S3 2/6 murmur at usb can be heard across precordium and at apex Left BKA Right foot wrapped - dsg not removed AVF right positive bruit  Labs:   Lab 01/26/12 0733 01/25/12 0445 01/24/12 0741 01/23/12 0650 01/22/12 0640 01/21/12 1809  NA 133* 137 135 133* 135 134*  K 4.1 4.0 4.3 4.7 3.8 4.6  CL 92* 95* 93* 92* 91* 89*  CO2 26 29 26 25 29 29   GLUCOSE 125* 117* 84 135* 105* 80  BUN 50* 31* 50* 34* 55* 49*  CREATININE 8.22* 6.02* 8.51* 6.34* 9.53* 8.51*  ALB -- -- -- -- -- --  CALCIUM 8.7 8.9 8.6 8.7 8.4 9.0  PHOS 4.0 -- 4.4 -- -- --     Lab 01/26/12 0733 01/24/12 0741 01/21/12 1809  AST -- -- 19  ALT -- -- 12  ALKPHOS -- -- 53  BILITOT -- -- 0.7  PROT -- -- 7.8  ALBUMIN 2.4* 2.7* 3.4*   No results found for this basename: LIPASE:3,AMYLASE:3 in the last 168 hours No results found for this basename: AMMONIA:3 in the last 168 hours   Lab 01/28/12 0822 01/27/12 0630 01/26/12 0232 01/25/12 0445 01/22/12 0640 01/21/12 1809  WBC 13.7* 14.1* 10.7* 12.9* -- --  NEUTROABS -- -- -- --  10.4* 12.0*  HGB 10.0* 10.9* 8.9* 10.2* -- --  HCT 31.3* 34.1* 27.1* 32.3* -- --  MCV 90.7 91.7 91.6 91.5 -- --  PLT 310 310 234 263 -- --   No results found for this basename: CKTOTAL:5,CKMB:5,CKMBINDEX:5,TROPONINI:5 in the last 168 hours   Lab 01/28/12 0750 01/28/12 0120 01/27/12 2118 01/27/12 1650 01/27/12 1155  GLUCAP 108* 133* 155* 139* 152*   Lab Results  Component Value Date   INR 2.18* 01/28/2012   INR 1.55* 01/27/2012   INR 1.40 01/26/2012   Studies/Results: No results found.     . sodium chloride 20 mL/hr at 01/28/12 0801      . allopurinol  150 mg Oral Daily  . calcium carbonate  1 tablet Oral TID WC  . darbepoetin (ARANESP) injection - DIALYSIS  150 mcg Intravenous Q Tue-HD  . ferric gluconate (FERRLECIT/NULECIT) IV  125 mg Intravenous Q Thu-HD  . insulin aspart  0-9 Units Subcutaneous TID WC  . metoprolol tartrate  12.5 mg Oral BID  . multivitamin  1 tablet Oral QHS  . paricalcitol  4 mcg Intravenous Q T,Th,Sa-HD  . simvastatin  40 mg Oral QHS  . sodium chloride  3 mL Intravenous Q12H  . vancomycin  1,000 mg Intravenous Q T,Th,Sa-HD  . warfarin  2 mg Oral ONCE-1800  . Warfarin - Pharmacist Dosing Inpatient   Does not apply q1800    I  have reviewed scheduled and prn medications.  Dialysis Orders: Center: [facility] on TTS .  EDW 106 kg HD Bath 2.0 k, 2.25 ca Time 4 hrs 15 min Heparin 3,400units. Access Right upper arm avf BFR 500 DFR 800 Hectoral 4 mcg IV/HD Epogen 1000 on tue and sat IV/HD Venofer 100mg  q weekly hd  Other 0    ASSESSMENT/RECOMMENDATIONS  1. Gangrene R foot, s/p 2nd toe amp 1/17- per [facility]/ low grade temps 99's. Dr. Edilia Bo plans to continue antibiotics until 01/28/12 (today) NOTED [facility] plans to take today 2. ESRD, cont tts HD 3. HTN/volume- uf yesterday in hd 3972 to post wt 105.9 And rewt 102.5 ( bed wts not accurate)at dry BP 110/66 this am 12.5 mg bid Metoprolol 12.5 mg bid 4. Anemia - 9.5 >9.7> 10.2>8.9> 10.9 hgb on weekly iron  and on Aranesp 150 mg weekly hd 5. MBD - Ca and phos in normal range, cont vit D and tums as binder 6. Atrial Fib  Pharmacy dosing coumadin; INR therapeutic; heparin to stop today / metoprolol bid 7. CAD Sp CAGB 1996/ Ho mod. Aortic Stenosis not surgical candidate / card following 8. DM Type 2 = per admit team   Camille Bal, MD [facility] [phone] Pager 01/28/2012, 1:14 PM

## 2012-01-28 NOTE — Progress Notes (Signed)
Admitting patient to CIR today. Dr Jerral Ralph reports pt is ready to d/c to CIR. Pt would benefit from inpatient rehab.  Met with pt and spoke to his wife by phone about plan to come to rehab today and both are in agreement with plan.  Please call for questions: [phone]

## 2012-01-28 NOTE — Progress Notes (Signed)
Report called to nurse on 4000. Pt remains stable. Pt transported via bed to 4007. [place], Rosanna Randy

## 2012-01-28 NOTE — Plan of Care (Signed)
Overall Plan of Care [facility]) Patient Details Name: Alan [name] MRN: [id number] DOB: [birth year]  Diagnosis:  Right second toe amputation. Hx of recent left BKA  Co-morbidities: ESRD, ?mild dementia, wound care, DM, anemia, gout, htn  Functional Problem List  Patient demonstrates impairments in the following areas: Bowel, Pain, Safety and Skin Integrity  Basic ADL's: grooming, bathing, dressing and toileting Advanced ADL's: n/a at this time  Transfers:  bed mobility, bed to chair, toilet, tub/shower, car and furniture Locomotion:  ambulation and wheelchair mobility  Additional Impairments:  Leisure Awareness  Anticipated Outcomes Item Anticipated Outcome  Eating/Swallowing    Basic self-care  supervision  Tolieting  supervision  Bowel/Bladder  Mod I  Transfers  S basic; min A car  Locomotion  S w/c level  Communication    Cognition    Pain  <3  Safety/Judgment    Other     Therapy Plan: PT Intensity: Minimum of 1-2 x/day ,45 to 90 minutes PT Frequency: 5 out of 7 days PT Duration Estimated Length of Stay: 10-12 days OT Intensity: Minimum of 1-2 x/day, 45 to 90 minutes OT Frequency: 5 out of 7 days OT Duration/Estimated Length of Stay: 10-14 days      Team Interventions: Item RN PT OT SLP SW TR Other  Self Care/Advanced ADL Retraining  x x      Neuromuscular Re-Education  x x      Therapeutic Activities  x x      UE/LE Strength Training/ROM  x x      UE/LE Coordination Activities  x x      Visual/Perceptual Remediation/Compensation         DME/Adaptive Equipment Instruction  x x      Therapeutic Exercise  x x      Balance/Vestibular Training  x x      Patient/Family Education  x x      Cognitive Remediation/Compensation  x x      Functional Mobility Training  x x      Ambulation/Gait Training  x       Stair Training  x       Wheelchair Propulsion/Positioning  x x      Functional Tourist information centre manager Reintegration  x x       Dysphagia/Aspiration Film/video editor         Bladder Management         Bowel Management x        Disease Management/Prevention x x x      Pain Management x x x      Medication Management x        Skin Care/Wound Management x x x      Splinting/Orthotics x x x      Discharge Planning x x x      Psychosocial Support x x x                             Team Discharge Planning: Destination: PT-Home ,OT- Home , SLP-  Projected Follow-up: PT-Home health PT;24 hour supervision/assistance, OT-  Home health OT, SLP-  Projected Equipment Needs: PT-None recommended by PT (may need adjustments to w/c now with prosthesis), OT-  , SLP-  Patient/family involved in discharge planning: PT- Patient,  OT-Patient, SLP-   MD ELOS: 11 days Medical Rehab Prognosis:  Good Assessment:  The patient has been admitted for CIR therapies. The team will be addressing, functional mobility, strength, stamina, balance, safety, adaptive techniques/equipment, self-care, bowel and bladder mgt, patient and caregiver education, initial prosthetic training (gait and fit), skin care, pain control, weight bearing precautions for right foot. Goals have been set at supervision to mod I.      See Team Conference Notes for weekly updates to the plan of care

## 2012-01-28 NOTE — Progress Notes (Signed)
ANTICOAGULATION CONSULT NOTE - Follow Up Consult  Pharmacy Consult for Warfarin Indication: atrial fibrillation  Allergies  Allergen Reactions  . Ancef (Cefazolin) Rash    Patient Measurements:   Heparin Dosing Weight: 99 kg  Vital Signs: Temp: 98.9 F (37.2 C) (01/20 0920) Temp src: Oral (01/20 0920) BP: 100/64 mmHg (01/20 0920) Pulse Rate: 107  (01/20 0920)  Labs:  Alvira Philips 01/28/12 0822 01/28/12 0022 01/27/12 1731 01/27/12 0630 01/26/12 1050 01/26/12 0733 01/26/12 0232  HGB 10.0* -- -- 10.9* -- -- --  HCT 31.3* -- -- 34.1* -- -- 27.1*  PLT 310 -- -- 310 -- -- 234  APTT -- -- -- -- -- -- --  LABPROT 23.3* -- -- 18.1* 16.8* -- --  INR 2.18* -- -- 1.55* 1.40 -- --  HEPARINUNFRC 0.12* 0.35 0.24* -- -- -- --  CREATININE -- -- -- -- -- 8.22* --  CKTOTAL -- -- -- -- -- -- --  CKMB -- -- -- -- -- -- --  TROPONINI -- -- -- -- -- -- --    The CrCl is unknown because both a height and weight (above a minimum accepted value) are required for this calculation.   Assessment: 79 y.o. M on chronic warfarin PTA for hx Afib. Warfarin was held this admission for a toe amputation and resumed on 1/18 along with a heparin bridge. Heparin was stopped this morning after the patient pulled out his IV site -- which resulted in some bleeding. INR has jumped up today and is in the therapeutic range. Given the large jump in INR and some bleeding this morning -- will go ahead and discontinue the heparin bridge today. Hgb/Hct slight drop.  Goal of Therapy:  INR 2-3   Plan:  1. Warfarin 2 mg x 1 dose at 1800 today  2. Will continue to monitor for any signs/symptoms of bleeding and will follow up with PT/INR in the a.m.  3. Heparin d/c'd due to therapeutic INR and some bleeding this am.   Link Snuffer, PharmD, BCPS Clinical Pharmacist [phone] 01/28/2012 3:18 PM

## 2012-01-28 NOTE — Progress Notes (Signed)
ANTICOAGULATION CONSULT NOTE - Follow Up Consult  Pharmacy Consult for heparin Indication: atrial fibrillation  Labs:  Basename 01/28/12 0022 01/27/12 1731 01/27/12 0825 01/27/12 0630 01/26/12 1050 01/26/12 0733 01/26/12 0232 01/25/12 0445  HGB -- -- -- 10.9* -- -- 8.9* --  HCT -- -- -- 34.1* -- -- 27.1* 32.3*  PLT -- -- -- 310 -- -- 234 263  APTT -- -- -- -- -- -- -- --  LABPROT -- -- -- 18.1* 16.8* -- -- 18.0*  INR -- -- -- 1.55* 1.40 -- -- 1.54*  HEPARINUNFRC 0.35 0.24* 0.35 -- -- -- -- --  CREATININE -- -- -- -- -- 8.22* -- 6.02*  CKTOTAL -- -- -- -- -- -- -- --  CKMB -- -- -- -- -- -- -- --  TROPONINI -- -- -- -- -- -- -- --    Assessment/Plan:  79yo male now therapeutic on heparin after rate adjustment.  Will continue gtt at current rate and confirm stable with am labs.  Colleen Can PharmD BCPS 01/28/2012,1:12 AM

## 2012-01-28 NOTE — Consult Note (Signed)
Physical Medicine and Rehabilitation Consult Reason for Consult: Open ray amputation of right second toe/history left BKA Referring Physician: Jerral Ralph    HPI: Alan [name] is a 79 y.o. right-handed male with history of end-stage renal disease with hemodialysis, diabetes mellitus peripheral neuropathy, atrial fibrillation with chronic Coumadin and left BKA July 2013 and received inpatient rehabilitation services.  Admitted 01/22/2012 with chronic wound over right second toe with gangrenous changes. No change with conservative care and underwent open ray amputation of right second toe 01/25/2012 per Dr. Edilia Bo. Postoperative pain management. Patient remains on hemodialysis as per renal services. Chronic Coumadin resumed for atrial fibrillation. Chronic anemia with latest hemoglobin 10.9 and remains on Aranesp. Physical and occupational therapy evaluations are pending. M.D. is requested physical medicine rehabilitation consult to consider inpatient rehabilitation services   Review of Systems  Cardiovascular: Positive for palpitations.  Musculoskeletal: Positive for myalgias and joint pain.  Neurological: Positive for tingling and weakness.  All other systems reviewed and are negative.   Past Medical History  Diagnosis Date  . Diabetes mellitus   . Gout   . Hypertension   . Hyperlipidemia   . Chronic kidney disease     Started dialysis October 2012  . Arthritis   . Prostate cancer     s/p seed implant  . Cataract     BILATERAL-BEEN REMOVED  . Neuromuscular disorder     DABETIC NEUROPATHY-LOWER EXTREMITY  . Atrial fibrillation     on coumadin  . Aortic stenosis     moderate to severe, not felt to be a surgical candidate  . Anemia     secondary to end stage renal disease  . Coronary artery disease     remote CABG in 1996; last stress test in 2005; last cath 2005  . Diabetic neuropathy   . Edema   . PAD (peripheral artery disease)   . Diabetic retinopathy(362.0)   . Chronic  anticoagulation     on coumadin  . Nephrolithiasis     prior history of percutaneous nephrostomy  . Shortness of breath    Past Surgical History  Procedure Date  . Ventral hernia repair   . Inguinal hernia repair     right side  . Colonoscopy   . Kidney stone surgery   . Polypectomy   . Tonsillectomy   . Toe surgery     removal little toe right foot  . Cardiac catheterization 04/19/2003    SEVERE 2 VESSEL OBSTRUCTIVE ATHERSCLEROTIC CAD  . Coronary artery bypass graft 1996    LIMA GRAFT TO THE LAD, SEQUENTIAL SAPHENOUS  VEIN GRAFT TO THE FIRST AND SECOND DIAGONIAL BRANCHES, SEQUENTIAL SAPHENOUS VEIN GRAFT TO THE ACUTE MARGINAL, POSTERIOR DESCENDING, AND POSTERIOR LATERAL BRANCHES OF THE RIGHT CORONARY ARTERY  . Radioactive seed implant   . US echocardiography 09/06/2009    EF 55-60%  . Cardiovascular stress test 04/13/2003    EF 54%. EVIDENCE OF ANTERO-APICAL ISCHEMIA. NORMAL LV SIZE AND FUNCTION  . Tee without cardioversion 05/09/2011    Procedure: TRANSESOPHAGEAL ECHOCARDIOGRAM (TEE);  Surgeon: Laurey Morale, MD;  Location: [facility] ENDOSCOPY;  Service: Cardiovascular;  Laterality: N/A;  . Hernia repair   . Amputation 07/20/2011    Procedure: AMPUTATION BELOW KNEE;  Surgeon: Nada Libman, MD;  Location: [facility] OR;  Service: Vascular;  Laterality: Left;   Family History  Problem Relation Age of Onset  . Heart disease Father   . Hypertension Brother   . Stroke Brother   . Diabetes Mother  Pt. not sure, but he thinks she did.   Social History:  reports that he quit smoking about 43 years ago. His smoking use included Cigarettes. He quit after 2 years of use. He has quit using smokeless tobacco. His smokeless tobacco use included Chew. He reports that he does not drink alcohol or use illicit drugs. Allergies:  Allergies  Allergen Reactions  . Ancef (Cefazolin) Rash   Medications Prior to Admission  Medication Sig Dispense Refill  . allopurinol (ZYLOPRIM) 300 MG tablet Take 150 mg  by mouth daily.      [name] aspirin EC 81 MG tablet Take 81 mg by mouth daily.      . calcium carbonate (TUMS - DOSED IN MG ELEMENTAL CALCIUM) 500 MG chewable tablet Chew 1 tablet by mouth 3 (three) times daily with meals. Phosphorous binder      . Chlorphen-Pseudoephed-APAP (CORICIDIN D PO) Take 1 tablet by mouth at bedtime as needed. For cold symptoms      . estazolam (PROSOM) 2 MG tablet Take 1 mg by mouth at bedtime as needed. For sleep      . glimepiride (AMARYL) 4 MG tablet Take 2 mg by mouth daily.       . metoprolol tartrate (LOPRESSOR) 25 MG tablet Take 37.5 mg by mouth 2 (two) times daily.      . simvastatin (ZOCOR) 40 MG tablet Take 40 mg by mouth at bedtime.       [name] warfarin (COUMADIN) 5 MG tablet Take 2.5-5 mg by mouth daily. Takes 5 mg every day of the week except on Fridays, he takes 2.5 mg        Home: Home Living Lives With: Spouse Available Help at Discharge: Family;Available 24 hours/day (wife can't provide physical assist) Type of Home: House Home Access: Stairs to enter;Ramped entrance Entrance Stairs-Number of Steps: 2 Entrance Stairs-Rails: Right;Left Home Layout: One level Bathroom Shower/Tub: Health visitor: Standard Home Adaptive Equipment: Bedside commode/3-in-1;Wheelchair - manual;Walker - rolling;Straight cane  Functional History: Prior Function Driving: No Vocation: Retired Functional Status:  Mobility: Bed Mobility Bed Mobility: Supine to Sit;Sitting - Scoot to Edge of Bed Supine to Sit: 4: Min assist Sitting - Scoot to [place] of Bed: 4: Min assist Sit to Supine: 4: Min assist Transfers Transfers: Not assessed (pt. reports too fatigued from HD) Sit to Stand: 1: +2 Total assist;With upper extremity assist;From bed;From chair/3-in-1;With armrests Sit to Stand: Patient Percentage: 60% Stand to Sit: 1: +2 Total assist;With upper extremity assist;With armrests;To bed;To chair/3-in-1 Stand to Sit: Patient Percentage: 50% Stand Pivot Transfers:  1: +2 Total assist Stand Pivot Transfers: Patient Percentage: 60% Ambulation/Gait Ambulation/Gait Assistance: Not tested (comment)    ADL:    Cognition: Cognition Arousal/Alertness: Awake/alert Orientation Level: Oriented X4 Cognition Overall Cognitive Status: Appears within functional limits for tasks assessed/performed Arousal/Alertness: Awake/alert Orientation Level: Appears intact for tasks assessed Behavior During Session: [facility] for tasks performed  Blood pressure 120/66, pulse 80, temperature 98.6 F (37 C), temperature source Oral, resp. rate 18, height 6' (1.829 m), weight 103.692 kg (228 lb 9.6 oz), SpO2 94.00%. Physical Exam  Vitals reviewed. HENT:  Head: Normocephalic.  Eyes:       Pupils round and reactive to light  Neck: Neck supple. No thyromegaly present.  Cardiovascular:       Cardiac rate controlled  Pulmonary/Chest: Effort normal and breath sounds normal. No respiratory distress.  Abdominal: Soft. Bowel sounds are normal.  Neurological: He is alert.       Patient  was able to give appropriate name and date of birth. He was somewhat slow to process on living situation and address. He follows simple commands. UE's 4/5. LLE 3/5 HF, RLE grossly 2+ to 3/5 but limited participation in MMt  Skin:       Left BKA is well healed. Recent right foot surgery with dry dressing in place  Psychiatric:       Very flat. Slow to respond    Results for orders placed during the hospital encounter of 01/21/12 (from the past 24 hour(s))  PROTIME-INR     Status: Abnormal   Collection Time   01/27/12  6:30 AM      Component Value Range   Prothrombin Time 18.1 (*) 11.6 - 15.2 seconds   INR 1.55 (*) 0.00 - 1.49  CBC     Status: Abnormal   Collection Time   01/27/12  6:30 AM      Component Value Range   WBC 14.1 (*) 4.0 - 10.5 K/uL   RBC 3.72 (*) 4.22 - 5.81 MIL/uL   Hemoglobin 10.9 (*) 13.0 - 17.0 g/dL   HCT 16.1 (*) 09.6 - 04.5 %   MCV 91.7  78.0 - 100.0 fL   MCH 29.3  26.0  - 34.0 pg   MCHC 32.0  30.0 - 36.0 g/dL   RDW 40.9  81.1 - 91.4 %   Platelets 310  150 - 400 K/uL  GLUCOSE, CAPILLARY     Status: Abnormal   Collection Time   01/27/12  8:08 AM      Component Value Range   Glucose-Capillary 137 (*) 70 - 99 mg/dL  HEPARIN LEVEL (UNFRACTIONATED)     Status: Normal   Collection Time   01/27/12  8:25 AM      Component Value Range   Heparin Unfractionated 0.35  0.30 - 0.70 IU/mL  GLUCOSE, CAPILLARY     Status: Abnormal   Collection Time   01/27/12 11:55 AM      Component Value Range   Glucose-Capillary 152 (*) 70 - 99 mg/dL  GLUCOSE, CAPILLARY     Status: Abnormal   Collection Time   01/27/12  4:50 PM      Component Value Range   Glucose-Capillary 139 (*) 70 - 99 mg/dL  HEPARIN LEVEL (UNFRACTIONATED)     Status: Abnormal   Collection Time   01/27/12  5:31 PM      Component Value Range   Heparin Unfractionated 0.24 (*) 0.30 - 0.70 IU/mL  GLUCOSE, CAPILLARY     Status: Abnormal   Collection Time   01/27/12  9:18 PM      Component Value Range   Glucose-Capillary 155 (*) 70 - 99 mg/dL  HEPARIN LEVEL (UNFRACTIONATED)     Status: Normal   Collection Time   01/28/12 12:22 AM      Component Value Range   Heparin Unfractionated 0.35  0.30 - 0.70 IU/mL  GLUCOSE, CAPILLARY     Status: Abnormal   Collection Time   01/28/12  1:20 AM      Component Value Range   Glucose-Capillary 133 (*) 70 - 99 mg/dL   No results found.  Assessment/Plan: Diagnosis: Right second toe amp, hx of left BKA (just received prosthesis) 1. Does the need for close, 24 hr/day medical supervision in concert with the patient's rehab needs make it unreasonable for this patient to be served in a less intensive setting? Yes 2. Co-Morbidities requiring supervision/potential complications: CAD, DM,  ESRD 3. Due to  bladder management, bowel management, safety, skin/wound care, disease management, medication administration, pain management and patient education, does the patient require 24  hr/day rehab nursing? Yes 4. Does the patient require coordinated care of a physician, rehab nurse, PT (1-2 hrs/day, 5 days/week) and OT (1-2 hrs/day, 5 days/week) to address physical and functional deficits in the context of the above medical diagnosis(es)? Yes Addressing deficits in the following areas: balance, endurance, locomotion, strength, transferring, bowel/bladder control, bathing, dressing, feeding, grooming, toileting and psychosocial support 5. Can the patient actively participate in an intensive therapy program of at least 3 hrs of therapy per day at least 5 days per week? Yes 6. The potential for patient to make measurable gains while on inpatient rehab is good 7. Anticipated functional outcomes upon discharge from inpatient rehab are wheelchair level supervision with PT, supervision with OT, n/a with SLP. 8. Estimated rehab length of stay to reach the above functional goals is: 2 weeks 9. Does the patient have adequate social supports to accommodate these discharge functional goals? Yes 10. Anticipated D/C setting: Home 11. Anticipated post D/C treatments: HH therapy 12. Overall Rehab/Functional Prognosis: good  RECOMMENDATIONS: This patient's condition is appropriate for continued rehabilitative care in the following setting: CIR Patient has agreed to participate in recommended program. Yes and Potentially Note that insurance prior authorization may be required for reimbursement for recommended care.  Comment: This patient literally just received his BK prosthesis. Would benefit from  intensive therapy to improve functional mobility and self-care while integrating the left BK prosthesis and learning new transfer techniques.  Rehab RN to follow up.   Ivory Broad, MD     01/28/2012

## 2012-01-28 NOTE — H&P (Signed)
Physical Medicine and Rehabilitation Admission H&P  Chief Complaint   Patient presents with   .  Wound Infection   :  HPI: Alan [name] is a 79 y.o. 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 Admitted 01/22/2012 with chronic wound over right second toe with gangrenous changes. No change with conservative care and underwent open ray amputation of right second toe 01/25/2012 per Dr. Edilia Bo. Fitted with postoperative shoe and question weightbearing status. Postoperative pain management. Patient remains on hemodialysis as per renal services. Chronic Coumadin resumed for atrial fibrillation. Chronic anemia with latest hemoglobin 10.9 and remains on Aranesp. Physical therapy evaluation completed an ongoing. M.D/PT requested physical medicine rehabilitation consult to consider inpatient rehabilitation services. Patient was felt to be good candidate for inpatient rehabilitation services and was admitted for comprehensive rehabilitation program  Review of Systems  Cardiovascular: Positive for palpitations.  Musculoskeletal: Positive for myalgias and joint pain.  Neurological: Positive for tingling and weakness.  All other systems reviewed and are negative  Past Medical History   Diagnosis  Date   .  Diabetes mellitus    .  Gout    .  Hypertension    .  Hyperlipidemia    .  Chronic kidney disease      Started dialysis October 2012   .  Arthritis    .  Prostate cancer      s/p seed implant   .  Cataract      BILATERAL-BEEN REMOVED   .  Neuromuscular disorder      DABETIC NEUROPATHY-LOWER EXTREMITY   .  Atrial fibrillation      on coumadin   .  Aortic stenosis      moderate to severe, not felt to be a surgical  candidate   .  Anemia      secondary to end stage renal disease   .  Coronary artery disease      remote CABG in 1996; last stress test in 2005; last cath 2005   .  Diabetic neuropathy    .  Edema    .  PAD (peripheral artery disease)    .  Diabetic retinopathy(362.0)    .  Chronic anticoagulation      on coumadin   .  Nephrolithiasis      prior history of percutaneous nephrostomy   .  Shortness of breath     Past Surgical History   Procedure  Date   .  Ventral hernia repair    .  Inguinal hernia repair      right side   .  Colonoscopy    .  Kidney stone surgery    .  Polypectomy    .  Tonsillectomy    .  Toe surgery      removal little toe right foot   .  Cardiac catheterization  04/19/2003     SEVERE 2 VESSEL OBSTRUCTIVE ATHERSCLEROTIC CAD   .  Coronary artery bypass graft  1996     LIMA GRAFT TO THE LAD, SEQUENTIAL SAPHENOUS VEIN GRAFT TO THE FIRST AND SECOND DIAGONIAL BRANCHES, SEQUENTIAL SAPHENOUS VEIN GRAFT TO THE ACUTE MARGINAL, POSTERIOR DESCENDING, AND POSTERIOR LATERAL BRANCHES OF THE RIGHT CORONARY ARTERY   .  Radioactive seed implant    .  US echocardiography  09/06/2009  EF 55-60%   .  Cardiovascular stress test  04/13/2003     EF 54%. EVIDENCE OF ANTERO-APICAL ISCHEMIA. NORMAL LV SIZE AND FUNCTION   .  Tee without cardioversion  05/09/2011     Procedure: TRANSESOPHAGEAL ECHOCARDIOGRAM (TEE); Surgeon: Laurey Morale, MD; Location: [facility] ENDOSCOPY; Service: Cardiovascular; Laterality: N/A;   .  Hernia repair    .  Amputation  07/20/2011     Procedure: AMPUTATION BELOW KNEE; Surgeon: Nada Libman, MD; Location: [facility] OR; Service: Vascular; Laterality: Left;    Family History   Problem  Relation  Age of Onset   .  Heart disease  Father    .  Hypertension  Brother    .  Stroke  Brother    .  Diabetes  Mother       Pt. not sure, but he thinks she did.    Social History: reports that he quit smoking about 43 years ago. His smoking use included Cigarettes. He quit  after 2 years of use. He has quit using smokeless tobacco. His smokeless tobacco use included Chew. He reports that he does not drink alcohol or use illicit drugs.  Allergies:  Allergies   Allergen  Reactions   .  Ancef (Cefazolin)  Rash    Medications Prior to Admission   Medication  Sig  Dispense  Refill   .  allopurinol (ZYLOPRIM) 300 MG tablet  Take 150 mg by mouth daily.     [name]  aspirin EC 81 MG tablet  Take 81 mg by mouth daily.     .  calcium carbonate (TUMS - DOSED IN MG ELEMENTAL CALCIUM) 500 MG chewable tablet  Chew 1 tablet by mouth 3 (three) times daily with meals. Phosphorous binder     .  Chlorphen-Pseudoephed-APAP (CORICIDIN D PO)  Take 1 tablet by mouth at bedtime as needed. For cold symptoms     .  estazolam (PROSOM) 2 MG tablet  Take 1 mg by mouth at bedtime as needed. For sleep     .  glimepiride (AMARYL) 4 MG tablet  Take 2 mg by mouth daily.     .  metoprolol tartrate (LOPRESSOR) 25 MG tablet  Take 37.5 mg by mouth 2 (two) times daily.     .  simvastatin (ZOCOR) 40 MG tablet  Take 40 mg by mouth at bedtime.     [name]  warfarin (COUMADIN) 5 MG tablet  Take 2.5-5 mg by mouth daily. Takes 5 mg every day of the week except on Fridays, he takes 2.5 mg      Home:  Home Living  Lives With: Spouse  Available Help at Discharge: Family;Available 24 hours/day (wife can't provide physical assist)  Type of Home: House  Home Access: Stairs to enter;Ramped entrance  Entrance Stairs-Number of Steps: 2  Entrance Stairs-Rails: Right;Left  Home Layout: One level  Bathroom Shower/Tub: Pension scheme manager: Standard  Home Adaptive Equipment: Bedside commode/3-in-1;Wheelchair - manual;Walker - rolling;Straight cane  Functional History:  Prior Function  Driving: No  Vocation: Retired  Functional Status:  Mobility:  Bed Mobility  Bed Mobility: Supine to Sit;Sitting - Scoot to Edge of Bed  Supine to Sit: 4: Min assist  Sitting - Scoot to [place] of Bed: 4: Min assist  Sit to  Supine: 4: Min assist  Transfers  Transfers: Not assessed (pt. reports too fatigued from HD)  Sit to Stand: 1: +2 Total assist;With upper extremity assist;From bed;From chair/3-in-1;With armrests  Sit to Stand: Patient  Percentage: 60%  Stand to Sit: 1: +2 Total assist;With upper extremity assist;With armrests;To bed;To chair/3-in-1  Stand to Sit: Patient Percentage: 50%  Stand Pivot Transfers: 1: +2 Total assist  Stand Pivot Transfers: Patient Percentage: 60%  Ambulation/Gait  Ambulation/Gait Assistance: Not tested (comment)   ADL:   Cognition:  Cognition  Arousal/Alertness: Awake/alert  Orientation Level: Oriented X4  Cognition  Overall Cognitive Status: Appears within functional limits for tasks assessed/performed  Arousal/Alertness: Awake/alert  Orientation Level: Appears intact for tasks assessed  Behavior During Session: [facility] for tasks performed    Blood pressure 100/64, pulse 107, temperature 98.9 F (37.2 C), temperature source Oral, resp. rate 18, height 6' (1.829 m), weight 103.692 kg (228 lb 9.6 oz), SpO2 94.00%.  Physical Exam  Vitals reviewed.  HENT: mucosa pink Head: Normocephalic.  Eyes:  Pupils round and reactive to light  Neck: Neck supple. No thyromegaly present.  Cardiovascular:  Cardiac rate controlled, no murmur or rub.  Pulmonary/Chest: Effort normal and breath sounds normal. No respiratory distress. No wheezes Abdominal: Soft. Bowel sounds are normal. Non-distended Neurological: He is fairly alert.  Patient was able to give appropriate name and date of birth. He was somewhat slow to process on living situation and address. He follows simple commands. UE's 4/5. LLE 3/5 HF, RLE grossly 2+ to 3/5 but limited participation in MMt  Skin:  Left BKA is well healed. Prosthesis appears to be fitting appropriately. Recent right foot surgery with dry dressing in place over 2nd toe. Modest serosanginous dc. Psychiatric:  Very flat. Slow to respond  Results for  orders placed during the hospital encounter of 01/21/12 (from the past 48 hour(s))   PROTIME-INR Status: Abnormal    Collection Time    01/26/12 10:50 AM   Component  Value  Range  Comment    Prothrombin Time  16.8 (*)  11.6 - 15.2 seconds     INR  1.40  0.00 - 1.49    GLUCOSE, CAPILLARY Status: Abnormal    Collection Time    01/26/12 11:53 AM   Component  Value  Range  Comment    Glucose-Capillary  169 (*)  70 - 99 mg/dL    VANCOMYCIN, RANDOM Status: Normal    Collection Time    01/26/12 12:10 PM   Component  Value  Range  Comment    Vancomycin Rm  24.4     HEPARIN LEVEL (UNFRACTIONATED) Status: Abnormal    Collection Time    01/26/12 12:10 PM   Component  Value  Range  Comment    Heparin Unfractionated  0.20 (*)  0.30 - 0.70 IU/mL    GLUCOSE, CAPILLARY Status: Abnormal    Collection Time    01/26/12 4:45 PM   Component  Value  Range  Comment    Glucose-Capillary  263 (*)  70 - 99 mg/dL    HEPARIN LEVEL (UNFRACTIONATED) Status: Abnormal    Collection Time    01/26/12 9:53 PM   Component  Value  Range  Comment    Heparin Unfractionated  <0.10 (*)  0.30 - 0.70 IU/mL    GLUCOSE, CAPILLARY Status: Abnormal    Collection Time    01/26/12 10:26 PM   Component  Value  Range  Comment    Glucose-Capillary  100 (*)  70 - 99 mg/dL    GLUCOSE, CAPILLARY Status: Normal    Collection Time    01/26/12 11:36 PM   Component  Value  Range  Comment    Glucose-Capillary  95  70 - 99 mg/dL    CULTURE, BLOOD (ROUTINE X 2) Status: Normal (Preliminary result)    Collection Time    01/27/12 12:35 AM   Component  Value  Range  Comment    Specimen Description  BLOOD LEFT HAND      Special Requests  BOTTLES DRAWN AEROBIC ONLY 3CC      Culture Setup Time  01/27/2012 12:13      Culture       Value:  BLOOD CULTURE RECEIVED NO GROWTH TO DATE CULTURE WILL BE HELD FOR 5 DAYS BEFORE ISSUING A FINAL NEGATIVE REPORT    Report Status  PENDING     CULTURE, BLOOD (ROUTINE X 2) Status: Normal (Preliminary result)     Collection Time    01/27/12 12:45 AM   Component  Value  Range  Comment    Specimen Description  BLOOD LEFT HAND      Special Requests  BOTTLES DRAWN AEROBIC ONLY 3CC      Culture Setup Time  01/27/2012 12:13      Culture       Value:  BLOOD CULTURE RECEIVED NO GROWTH TO DATE CULTURE WILL BE HELD FOR 5 DAYS BEFORE ISSUING A FINAL NEGATIVE REPORT    Report Status  PENDING     GLUCOSE, CAPILLARY Status: Abnormal    Collection Time    01/27/12 12:55 AM   Component  Value  Range  Comment    Glucose-Capillary  114 (*)  70 - 99 mg/dL    HEPARIN LEVEL (UNFRACTIONATED) Status: Normal    Collection Time    01/27/12 6:05 AM   Component  Value  Range  Comment    Heparin Unfractionated  0.32  0.30 - 0.70 IU/mL    PROTIME-INR Status: Abnormal    Collection Time    01/27/12 6:30 AM   Component  Value  Range  Comment    Prothrombin Time  18.1 (*)  11.6 - 15.2 seconds     INR  1.55 (*)  0.00 - 1.49    CBC Status: Abnormal    Collection Time    01/27/12 6:30 AM   Component  Value  Range  Comment    WBC  14.1 (*)  4.0 - 10.5 K/uL     RBC  3.72 (*)  4.22 - 5.81 MIL/uL     Hemoglobin  10.9 (*)  13.0 - 17.0 g/dL     HCT  16.1 (*)  09.6 - 52.0 %     MCV  91.7  78.0 - 100.0 fL     MCH  29.3  26.0 - 34.0 pg     MCHC  32.0  30.0 - 36.0 g/dL     RDW  04.5  40.9 - 81.1 %     Platelets  310  150 - 400 K/uL  DELTA CHECK NOTED   GLUCOSE, CAPILLARY Status: Abnormal    Collection Time    01/27/12 8:08 AM   Component  Value  Range  Comment    Glucose-Capillary  137 (*)  70 - 99 mg/dL    HEPARIN LEVEL (UNFRACTIONATED) Status: Normal    Collection Time    01/27/12 8:25 AM   Component  Value  Range  Comment    Heparin Unfractionated  0.35  0.30 - 0.70 IU/mL    GLUCOSE, CAPILLARY Status: Abnormal    Collection Time    01/27/12 11:55 AM   Component  Value  Range  Comment    Glucose-Capillary  152 (*)  70 - 99 mg/dL    GLUCOSE, CAPILLARY Status: Abnormal    Collection Time    01/27/12 4:50 PM   Component   Value  Range  Comment    Glucose-Capillary  139 (*)  70 - 99 mg/dL    HEPARIN LEVEL (UNFRACTIONATED) Status: Abnormal    Collection Time    01/27/12 5:31 PM   Component  Value  Range  Comment    Heparin Unfractionated  0.24 (*)  0.30 - 0.70 IU/mL    GLUCOSE, CAPILLARY Status: Abnormal    Collection Time    01/27/12 9:18 PM   Component  Value  Range  Comment    Glucose-Capillary  155 (*)  70 - 99 mg/dL    HEPARIN LEVEL (UNFRACTIONATED) Status: Normal    Collection Time    01/28/12 12:22 AM   Component  Value  Range  Comment    Heparin Unfractionated  0.35  0.30 - 0.70 IU/mL    GLUCOSE, CAPILLARY Status: Abnormal    Collection Time    01/28/12 1:20 AM   Component  Value  Range  Comment    Glucose-Capillary  133 (*)  70 - 99 mg/dL    GLUCOSE, CAPILLARY Status: Abnormal    Collection Time    01/28/12 7:50 AM   Component  Value  Range  Comment    Glucose-Capillary  108 (*)  70 - 99 mg/dL     Comment 1  Documented in Chart      Comment 2  Notify RN     PROTIME-INR Status: Abnormal    Collection Time    01/28/12 8:22 AM   Component  Value  Range  Comment    Prothrombin Time  23.3 (*)  11.6 - 15.2 seconds     INR  2.18 (*)  0.00 - 1.49    CBC Status: Abnormal    Collection Time    01/28/12 8:22 AM   Component  Value  Range  Comment    WBC  13.7 (*)  4.0 - 10.5 K/uL     RBC  3.45 (*)  4.22 - 5.81 MIL/uL     Hemoglobin  10.0 (*)  13.0 - 17.0 g/dL     HCT  16.1 (*)  09.6 - 52.0 %     MCV  90.7  78.0 - 100.0 fL     MCH  29.0  26.0 - 34.0 pg     MCHC  31.9  30.0 - 36.0 g/dL     RDW  04.5  40.9 - 81.1 %     Platelets  310  150 - 400 K/uL    HEPARIN LEVEL (UNFRACTIONATED) Status: Abnormal    Collection Time    01/28/12 8:22 AM   Component  Value  Range  Comment    Heparin Unfractionated  0.12 (*)  0.30 - 0.70 IU/mL     No results found.  Post Admission Physician Evaluation:  1. Functional deficits secondary to Right second toe amputation. Pt with hx of previous left BKA for which he  just received his prosthesis 2. Patient is admitted to receive collaborative, interdisciplinary care between the physiatrist, rehab nursing staff, and therapy team. 3. Patient's level of medical complexity and substantial therapy needs in context of that medical necessity cannot be provided at a lesser intensity of care such as a SNF. 4. Patient has experienced substantial functional loss from his/her baseline which was documented above under the "Functional History" and "Functional Status"  headings. Judging by the patient's diagnosis, physical exam, and functional history, the patient has potential for functional progress which will result in measurable gains while on inpatient rehab. These gains will be of substantial and practical use upon discharge in facilitating mobility and self-care at the household level. 5. Physiatrist will provide 24 hour management of medical needs as well as oversight of the therapy plan/treatment and provide guidance as appropriate regarding the interaction of the two. 6. 24 hour rehab nursing will assist with bladder management, bowel management, safety, skin/wound care, disease management, medication administration, pain management and patient education and help integrate therapy concepts, techniques,education, etc. 7. PT will assess and treat for: Lower extremity strength, range of motion, stamina, balance, functional mobility, safety, adaptive techniques and equipment, pain mgt, transfer techniques, wb precautions, prosthetic training. Goals are: supervision. 8. OT will assess and treat for: ADL's, functional mobility, safety, upper extremity strength, adaptive techniques and equipment, transfer training, pain mgt, family ed, pt ed. Goals are: supervision. 9. SLP will assess and treat for: n/a. Goals are: n/a. 10. Case Management and Social Worker will assess and treat for psychological issues and discharge planning. 11. Team conference will be held weekly to assess  progress toward goals and to determine barriers to discharge. 12. Patient will receive at least 3 hours of therapy per day at least 5 days per week. 13. ELOS: 2 weeks Prognosis: excellent   Medical Problem List and Plan:  1. Right second toe amputation 01/25/2012 secondary to gangrenous changes/history of left BKA and recent receive prosthesis. Need to confirm weightbearing status for right lower extremity/patient is wearing a shoe boot  2. DVT Prophylaxis/Anticoagulation: Chronic Coumadin therapy for atrial fibrillation. Continue heparin therapy until INR therapeutic. Monitor for any bleeding episodes  3. Pain Management: Percocet as needed. Monitor with increased activity  4. Neuropsych: This patient is capable of making decisions on his/her own behalf.  5. End stage renal disease/hemodialysis. Followup per renal services. Latest creatinine 8.22. Followup chemistries  6. Diabetes mellitus with peripheral neuropathy. Latest hemoglobin A1c of 6.3. Continue sliding scale insulin and check blood sugars a.c. and at bedtime. Patient on Amaryll 4 mg daily prior to admission. Resume oral agents as tolerated  7. Chronic anemia. Aranesp as directed as well as ferric gluconate. Latest hemoglobin 10.0. Followup labs with dialysis  8. Gout. Zyloprim. Monitor for any signs of flareup  9. Hypertension/atrial fibrillation. Lopressor 12.5 mg twice a day. Monitor with increased activity  10. Hyperlipidemia. Zocor   Ranelle Oyster, MD, [place] [facility] 01/28/12

## 2012-01-28 NOTE — PMR Pre-admission (Signed)
PMR Admission Coordinator Pre-Admission Assessment  Patient: Alan [name] is an 79 y.o., male MRN: [id number] DOB: 1931-09-29 Height: 6' (182.9 cm) Weight: 103.692 kg (228 lb 9.6 oz)              Insurance Information PRIMARY: [employer]      Policy#: [id number]      Subscriber: self Benefits: [place] Effective Date: 08/09/95 Deductible: $[id number]     OOP Max: None  Life Max: Unlimited CIR: 100% SNF: 100 days Outpatient: 80% Copay: 20%  Home Health: 100% DME: 80%  Copay: 20% Providers: Patient's choice  Emergency Contact Information Contact Information    Name Relation Home Work Mobile   Gabrys,[name] L Spouse [phone]       Current Medical History  Patient Admitting Diagnosis: Right second toe amp, hx of left BKA (just received prosthesis)  History of Present Illness:80 y.o. right-handed male with history of end-stage renal disease with hemodialysis, diabetes mellitus peripheral neuropathy, atrial fibrillation with chronic Coumadin and left BKA July 2013 and received inpatient rehabilitation services. Admitted 01/22/2012 with chronic wound over right second toe with gangrenous changes. No change with conservative care and underwent open ray amputation of right second toe 01/25/2012 per Dr. Edilia Bo. Postoperative pain management. Patient remains on hemodialysis as per renal services. Chronic Coumadin resumed for atrial fibrillation. Chronic anemia with latest hemoglobin 10.9 and remains on Aranesp.     Past Medical History  Past Medical History  Diagnosis Date  . Diabetes mellitus   . Gout   . Hypertension   . Hyperlipidemia   . Chronic kidney disease     Started dialysis October 2012  . Arthritis   . Prostate cancer     s/p seed implant  . Cataract     BILATERAL-BEEN REMOVED  . Neuromuscular disorder     DABETIC NEUROPATHY-LOWER EXTREMITY  . Atrial fibrillation     on coumadin  . Aortic stenosis     moderate to severe, not felt to be a surgical candidate    . Anemia     secondary to end stage renal disease  . Coronary artery disease     remote CABG in 1996; last stress test in 2005; last cath 2005  . Diabetic neuropathy   . Edema   . PAD (peripheral artery disease)   . Diabetic retinopathy(362.0)   . Chronic anticoagulation     on coumadin  . Nephrolithiasis     prior history of percutaneous nephrostomy  . Shortness of breath    Family History  Family history includes Diabetes in his mother; Heart disease in his father; Hypertension in his brother; and Stroke in his brother.  Prior Rehab/Hospitalizations: [facility] 07/2011  with R BKA   Current Medications  Current facility-administered medications:0.9 %  sodium chloride infusion, , Intravenous, Continuous, Kipp Brood, MD, Last Rate: 20 mL/hr at 01/28/12 0801;  acetaminophen (TYLENOL) suppository 650 mg, 650 mg, Rectal, Q6H PRN, Eddie North, MD, 650 mg at 01/22/12 0350;  acetaminophen (TYLENOL) tablet 650 mg, 650 mg, Oral, Q6H PRN, Eddie North, MD, 650 mg at 01/26/12 0620 allopurinol (ZYLOPRIM) tablet 150 mg, 150 mg, Oral, Daily, Nishant Dhungel, MD, 150 mg at 01/28/12 1023;  calcium carbonate (TUMS - dosed in mg elemental calcium) chewable tablet 200 mg of elemental calcium, 1 tablet, Oral, TID WC, Nishant Dhungel, MD, 200 mg of elemental calcium at 01/28/12 0800;  darbepoetin (ARANESP) injection 150 mcg, 150 mcg, Intravenous, Q Marline Backbone McIntosh, PA, 150 mcg at 01/22/12 1736 ferric  gluconate (NULECIT) 125 mg in sodium chloride 0.9 % 100 mL IVPB, 125 mg, Intravenous, Q Thu-HD, Donald Pore, PA;  insulin aspart (novoLOG) injection 0-9 Units, 0-9 Units, Subcutaneous, TID WC, Maretta Bees, MD, 1 Units at 01/27/12 1742;  metoprolol tartrate (LOPRESSOR) tablet 12.5 mg, 12.5 mg, Oral, BID, Maree Krabbe, MD, 12.5 mg at 01/28/12 1023 multivitamin (RENA-VIT) tablet 1 tablet, 1 tablet, Oral, QHS, Donald Pore, PA, 1 tablet at 01/27/12 2156;  oxyCODONE-acetaminophen  (PERCOCET/ROXICET) 5-325 MG per tablet 1-2 tablet, 1-2 tablet, Oral, Q4H PRN, Chuck Hint, MD, 2 tablet at 01/28/12 [id number];  paricalcitol [facility]) injection 4 mcg, 4 mcg, Intravenous, Q T,Th,Sa-HD, Donald Pore, PA, 4 mcg at 01/26/12 9604 simvastatin (ZOCOR) tablet 40 mg, 40 mg, Oral, QHS, Nishant Dhungel, MD, 40 mg at 01/27/12 2156;  sodium chloride 0.9 % injection 3 mL, 3 mL, Intravenous, Q12H, Nishant Dhungel, MD, 3 mL at 01/28/12 1000;  vancomycin (VANCOCIN) IVPB 1000 mg/200 mL premix, 1,000 mg, Intravenous, Q T,Th,Sa-HD, Clydia Llano, MD, 1,000 mg at 01/26/12 1011;  Warfarin - Pharmacist Dosing Inpatient, , Does not apply, q1800, Madolyn Frieze, PHARMD  Patients Current Diet: Carb Control  Precautions / Restrictions Precautions Precautions: Fall Other Brace/Splint: Post-op shoe Right foot (worthconsidering a Darco) Restrictions Weight Bearing Restrictions:  (No WBing precautions for Right foot found on chart) Other Position/Activity Restrictions: no Wb restriction orders in chart, pt. wearing post op shoe in bed   Prior Activity Level Limited Community (1-2x/wk): 3x/wk for [facility] D[facility] Home Assistive Devices / Equipment Home Assistive Devices/Equipment: Dentures ;Wheelchair;Shower chair without back Home Adaptive Equipment: Bedside commode/3-in-1;Wheelchair - manual;Walker - rolling;Straight cane  Prior Functional Level Prior Function Level of Independence: Needs assistance Needs Assistance: Bathing;Dressing;Toileting;Transfers;Meal Prep Transfer Assistance: supervision to mod I Driving: No Vocation: Retired  Current Functional Level Cognition  Arousal/Alertness: Awake/alert Overall Cognitive Status: Appears within functional limits for tasks assessed/performed Orientation Level: Oriented X4 Cognition - Other Comments: Somewhat slow to answer questions    Extremity Assessment (includes Sensation/Coordination)     RLE ROM/Strength/Tone: Deficits RLE  ROM/Strength/Tone Deficits: grossly 4-/5    ADLs       Mobility  Bed Mobility: Supine to Sit;Sitting - Scoot to Edge of Bed Supine to Sit: 3: Mod assist;With rails;HOB elevated Sitting - Scoot to Edge of Bed: 4: Min assist Sit to Supine: 4: Min assist    Transfers  Transfers: Sit to Stand;Stand to Sit;Lateral/Scoot Transfers Sit to Stand: 1: +2 Total assist;With upper extremity assist;From bed;From chair/3-in-1;With armrests Sit to Stand: Patient Percentage: 50% Stand to Sit: 1: +2 Total assist;With upper extremity assist;With armrests;To bed;To chair/3-in-1 Stand to Sit: Patient Percentage: 50% Stand Pivot Transfers: 1: +2 Total assist Stand Pivot Transfers: Patient Percentage: 60% Lateral/Scoot Transfers: 3: Mod assist;With armrests removed;With slide board    Ambulation / Gait / Stairs / Wheelchair Mobility  Ambulation/Gait Ambulation/Gait Assistance: Not tested (comment)    Posture / Balance Dynamic Sitting Balance Dynamic Sitting - Balance Support: Left upper extremity supported Dynamic Sitting - Level of Assistance: 6: Modified independent (Device/Increase time)    Special needs/care consideration BiPAP/CPAP - no CPM - no Continuous Drip IV - no Dialysis - yes        Days: Tu, Th, Sat Life Vest - no Oxygen - no Special Bed - no Trach Size - no Wound Vac (area) - no     Skin: (R)foot surgical wound/2nd toe amputation. Wet -to-dry dsg change tid (R) ankle wound. Gauze dsg. (L) knee: skin  tear Bowel mgmt: LBM 01/25/12 Bladder mgmt: Oliguria Diabetic mgmt: yes   Previous Home Environment Living Arrangements: Spouse/significant other;Children Lives With: Spouse Available Help at Discharge: Family;Available 24 hours/day (wife can't provide physical assist) Type of Home: House Home Layout: One level Home Access: Stairs to enter;Ramped entrance Entrance Stairs-Rails: Right;Left Entrance Stairs-Number of Steps: 2 Bathroom Shower/Tub: Health visitor:  Standard Home Care Services: No  Discharge Living Setting Plans for Discharge Living Setting: Patient's home;Lives with (comment) (wife) Type of Home at Discharge: House Discharge Home Layout: One level Discharge Home Access: Ramped entrance Discharge Bathroom Shower/Tub: Walk-in shower Discharge Bathroom Toilet: Standard Discharge Bathroom Accessibility: Yes How Accessible:Pt transfers from his w/c to a rolling office chair to use in bathroom. Do you have any problems obtaining your medications?: No  Social/Family/Support Systems Patient Roles: Spouse;Parent Contact Information: [phone] Anticipated Caregiver: Wife: Raad Clayson (Pt has daughter and son in town)) Anticipated Caregiver's Contact Information: [phone] Ability/Limitations of Caregiver: min assist Caregiver Availability: 24/7 Discharge Plan Discussed with Primary Caregiver: Yes Is Caregiver In Agreement with Plan?: Yes Does Caregiver/Family have Issues with Lodging/Transportation while Pt is in Rehab?: No  Goals/Additional Needs Patient/Family Goal for Rehab: Supervision at w/c level. Expected length of stay: 2 weeks Cultural Considerations: none Dietary Needs: Carb mod Equipment Needs: TBD Special Service Needs: Pt to HD on Tu, Th, & Sat Pt/Family Agrees to Admission and willing to participate: Yes Program Orientation Provided & Reviewed with Pt/Caregiver Including Roles  & Responsibilities: Yes  Decrease burden of Care through IP rehab admission: Use of new prosthesis, Specialzed equipment needs, Decrease number of caregivers and Patient/family education  Possible need for SNF placement upon discharge: Plan is to return home.  Patient Condition: This patient's condition remains as documented in the Consult dated 01/28/12, in which the Rehabilitation Physician determined and documented that the patient's condition is appropriate for intensive rehabilitative care in an inpatient rehabilitation  facility.  Preadmission Screen Completed By:  Meryl Dare, 01/28/2012 11:44 AM ______________________________________________________________________   Discussed status with Dr. Riley Kill on 01/28/12 at 12:00 PM and received telephone approval for admission today.  Admission Coordinator:  Meryl Dare, time 12:05 PM / Date 01/28/12

## 2012-01-28 NOTE — Progress Notes (Signed)
Pt transferred to Rehab from 6700. Wife and pt orientated to unit. Diagnostic specific material provided, along with therapy schedule, explanation of rehab expectations, etc. All questions answered. Pt's butterfly wish is to 'get better and go home'.

## 2012-01-29 ENCOUNTER — Inpatient Hospital Stay (HOSPITAL_COMMUNITY): Payer: MEDICARE

## 2012-01-29 ENCOUNTER — Inpatient Hospital Stay (HOSPITAL_COMMUNITY): Payer: MEDICARE | Admitting: Occupational Therapy

## 2012-01-29 ENCOUNTER — Encounter (HOSPITAL_COMMUNITY): Payer: Self-pay | Admitting: *Deleted

## 2012-01-29 DIAGNOSIS — S88119A Complete traumatic amputation at level between knee and ankle, unspecified lower leg, initial encounter: Secondary | ICD-10-CM

## 2012-01-29 DIAGNOSIS — N186 End stage renal disease: Secondary | ICD-10-CM

## 2012-01-29 DIAGNOSIS — I70269 Atherosclerosis of native arteries of extremities with gangrene, unspecified extremity: Secondary | ICD-10-CM

## 2012-01-29 DIAGNOSIS — S98139A Complete traumatic amputation of one unspecified lesser toe, initial encounter: Secondary | ICD-10-CM

## 2012-01-29 LAB — CBC
HCT: 28.8 % — ABNORMAL LOW (ref 39.0–52.0)
MCHC: 32.3 g/dL (ref 30.0–36.0)
Platelets: 308 10*3/uL (ref 150–400)
RDW: 13.5 % (ref 11.5–15.5)

## 2012-01-29 LAB — RENAL FUNCTION PANEL
Albumin: 2.6 g/dL — ABNORMAL LOW (ref 3.5–5.2)
BUN: 59 mg/dL — ABNORMAL HIGH (ref 6–23)
Phosphorus: 4.2 mg/dL (ref 2.3–4.6)
Potassium: 4.4 mEq/L (ref 3.5–5.1)
Sodium: 129 mEq/L — ABNORMAL LOW (ref 135–145)

## 2012-01-29 LAB — GLUCOSE, CAPILLARY
Glucose-Capillary: 154 mg/dL — ABNORMAL HIGH (ref 70–99)
Glucose-Capillary: 156 mg/dL — ABNORMAL HIGH (ref 70–99)

## 2012-01-29 LAB — HEPATITIS B SURFACE ANTIGEN: Hepatitis B Surface Ag: NEGATIVE

## 2012-01-29 MED ORDER — WARFARIN SODIUM 1 MG PO TABS
1.0000 mg | ORAL_TABLET | Freq: Once | ORAL | Status: DC
  Filled 2012-01-29: qty 1

## 2012-01-29 MED ORDER — DARBEPOETIN ALFA-POLYSORBATE 150 MCG/0.3ML IJ SOLN
INTRAMUSCULAR | Status: AC
  Filled 2012-01-29: qty 0.3

## 2012-01-29 NOTE — Evaluation (Signed)
Occupational Therapy Assessment and Plan & Session Notes  Patient Details  Name: Alan [name] MRN: [id number] Date of Birth: Nov 21, 1931  OT Diagnosis: acute pain and muscle weakness (generalized) Rehab Potential: Rehab Potential: Good ELOS: 10-14 days   Today's Date: 01/29/2012  Problem List:  Patient Active Problem List  Diagnosis  . Encounter for long-term (current) use of anticoagulants  . Aortic stenosis  . CAD (coronary artery disease)  . End stage renal disease on dialysis  . Sepsis  . Toxic metabolic encephalopathy  . Diabetes mellitus  . Chronic anticoagulation  . Hypotension  . Atherosclerosis of native arteries of the extremities with ulceration(440.23)  . Aftercare following surgery of the circulatory system, NEC  . SIRS (systemic inflammatory response syndrome)  . Wound infection  . Unilateral complete BKA  . Atrial fibrillation  . Pre-operative cardiovascular examination  . Hx of BKA    Past Medical History:  Past Medical History  Diagnosis Date  . Diabetes mellitus   . Gout   . Hypertension   . Hyperlipidemia   . Chronic kidney disease     Started dialysis October 2012  . Arthritis   . Prostate cancer     s/p seed implant  . Cataract     BILATERAL-BEEN REMOVED  . Neuromuscular disorder     DABETIC NEUROPATHY-LOWER EXTREMITY  . Atrial fibrillation     on coumadin  . Aortic stenosis     moderate to severe, not felt to be a surgical candidate  . Anemia     secondary to end stage renal disease  . Coronary artery disease     remote CABG in 1996; last stress test in 2005; last cath 2005  . Diabetic neuropathy   . Edema   . PAD (peripheral artery disease)   . Diabetic retinopathy(362.0)   . Chronic anticoagulation     on coumadin  . Nephrolithiasis     prior history of percutaneous nephrostomy  . Shortness of breath    Past Surgical History:  Past Surgical History  Procedure Date  . Ventral hernia repair   . Inguinal hernia repair       right side  . Colonoscopy   . Kidney stone surgery   . Polypectomy   . Tonsillectomy   . Toe surgery     removal little toe right foot  . Cardiac catheterization 04/19/2003    SEVERE 2 VESSEL OBSTRUCTIVE ATHERSCLEROTIC CAD  . Coronary artery bypass graft 1996    LIMA GRAFT TO THE LAD, SEQUENTIAL SAPHENOUS  VEIN GRAFT TO THE FIRST AND SECOND DIAGONIAL BRANCHES, SEQUENTIAL SAPHENOUS VEIN GRAFT TO THE ACUTE MARGINAL, POSTERIOR DESCENDING, AND POSTERIOR LATERAL BRANCHES OF THE RIGHT CORONARY ARTERY  . Radioactive seed implant   . US echocardiography 09/06/2009    EF 55-60%  . Cardiovascular stress test 04/13/2003    EF 54%. EVIDENCE OF ANTERO-APICAL ISCHEMIA. NORMAL LV SIZE AND FUNCTION  . Tee without cardioversion 05/09/2011    Procedure: TRANSESOPHAGEAL ECHOCARDIOGRAM (TEE);  Surgeon: Laurey Morale, MD;  Location[facility] ENDOSCOPY;  Service: Cardiovascular;  Laterality: N/A;  . Hernia repair   . Amputation 07/20/2011    Procedure: AMPUTATION BELOW KNEE;  Surgeon: Nada Libman, MD;  Location: [facility] OR;  Service: Vascular;  Laterality: Left;  . Amputation 01/25/2012    Procedure: AMPUTATION DIGIT;  Surgeon: Chuck Hint, MD;  Location: [facility] OR;  Service: Vascular;  Laterality: Right;  second    Clinical Impression: Alan [name] is a 79 y.o. right-handed  male with history of end-stage renal disease with hemodialysis, diabetes mellitus peripheral neuropathy, atrial fibrillation with chronic Coumadin and left BKA July 2013 and received inpatient rehabilitation services and recently received his prosthesis from [employer] with limited prosthetic teaching thus far. Admitted 01/22/2012 with chronic wound over right second toe with gangrenous changes. No change with conservative care and underwent open ray amputation of right second toe 01/25/2012 per Dr. Edilia Bo. Fitted with postoperative shoe and question weightbearing status. Postoperative pain management. Patient remains on hemodialysis as per  renal services. Chronic Coumadin resumed for atrial fibrillation. Chronic anemia with latest hemoglobin 10.9 and remains on Aranesp. Physical therapy evaluation completed an ongoing. M.D/PT requested physical medicine rehabilitation consult to consider inpatient rehabilitation services. Patient was felt to be good candidate for inpatient rehabilitation services and was admitted for comprehensive rehabilitation program. Patient transferred to CIR on 01/28/2012 .    Patient currently requires min-total assist with basic self-care skills secondary to muscle weakness and muscle joint tightness and decreased sitting balance, decreased standing balance, decreased postural control and decreased balance strategies.  Prior to hospitalization, patient could complete ADLs with supervision/light min assist.   Patient will benefit from skilled intervention to increase independence with basic self-care skills prior to discharge home with care partner.  Anticipate patient will require 24 hour supervision and follow up home health.  OT - End of Session Activity Tolerance: Tolerates 10 - 20 min activity with multiple rests Endurance Deficit: Yes OT Assessment Rehab Potential: Good Barriers to Discharge: Decreased caregiver support OT Plan OT Intensity: Minimum of 1-2 x/day, 45 to 90 minutes OT Frequency: 5 out of 7 days OT Duration/Estimated Length of Stay: 10-14 days OT Treatment/Interventions: Metallurgist training;Community reintegration;Discharge planning;DME/adaptive equipment instruction;Functional mobility training;Neuromuscular re-education;Pain management;Patient/family education;Psychosocial support;Self Care/advanced ADL retraining;Skin care/wound managment;Splinting/orthotics;Therapeutic Activities;Therapeutic Exercise;UE/LE Strength taining/ROM;UE/LE Coordination activities;Wheelchair propulsion/positioning OT Recommendation Patient destination: Home Follow Up Recommendations: Home health  OT Equipment Details: may need shower chair  Precautions/Restrictions  Precautions Precautions: Fall Precaution Comments: L prosthesis new for pt; R foot with post-op shoe OOB and WB status still needs to be confirmed Required Braces or Orthoses: Other Brace/Splint Other Brace/Splint: Post-up shoe for right foot Restrictions Weight Bearing Restrictions: No  General Chart Reviewed: Yes Family/Caregiver Present: No  Vital Signs Therapy Vitals BP: 96/40 mmHg  Pain Pain Assessment Pain Assessment: No/denies pain Pain Score: 0-No pain Faces Pain Scale: No hurt  Home Living/Prior Functioning Home Living Lives With: Spouse Available Help at Discharge: Family;Available 24 hours/day (wife only able to do S) Type of Home: House Home Access: Stairs to enter;Ramped entrance Entrance Stairs-Number of Steps: 2 Entrance Stairs-Rails: Right;Left Home Layout: One level Bathroom Shower/Tub: Walk-in shower;Door Bathroom Toilet: Standard Bathroom Accessibility: No (pt reports w/c does not fit) Home Adaptive Equipment: Bedside commode/3-in-1;Wheelchair - manual;Walker - rolling;Straight cane;Other (comment) (sliding board) IADL History Homemaking Responsibilities: No Current License: No Mode of Transportation: Car;Family 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" Prior Function Level of Independence: Needs assistance with ADLs;Needs assistance with homemaking;Independent with transfers;Requires assistive device for independence Vocation: Retired Leisure: Hobbies-yes (Comment) Comments: Enjoys going to football games, watch TV  ADL - See FIM  Vision/Perception  Vision - History Baseline Vision: Wears glasses only for reading Patient Visual Report: No change from baseline Vision - Assessment Vision Assessment: Vision not tested Perception Perception: Within Functional Limits Praxis Praxis: Intact   Cognition Overall Cognitive Status: Impaired at  baseline Arousal/Alertness: Awake/alert Orientation Level: Oriented X4 Memory: Impaired Awareness: Impaired Problem Solving: Impaired Safety/Judgment: Impaired  Sensation  Sensation Light Touch: Impaired by gross assessment (reports numbness in LE's) Additional Comments: bilateral UEs appear intact Coordination Gross Motor Movements are Fluid and Coordinated: Yes Fine Motor Movements are Fluid and Coordinated: Yes  Motor  Motor Motor: Within Functional Limits  Trunk/Postural Assessment  Cervical Assessment Cervical Assessment: Within Functional Limits Thoracic Assessment Thoracic Assessment: Exceptions to [facility] (kyphotic posture) Lumbar Assessment Lumbar Assessment: Exceptions to [facility] (posterior tilt)   Balance Balance Balance Assessed: Yes Static Sitting Balance Static Sitting - Level of Assistance: 5: Stand by assistance Dynamic Sitting Balance Dynamic Sitting - Level of Assistance: 5: Stand by assistance Static Standing Balance Static Standing - Level of Assistance: 3: Mod assist  Extremity/Trunk Assessment RUE Assessment RUE Assessment: Within Functional Limits (can benefit from strengthening) LUE Assessment LUE Assessment: Within Functional Limits (can benefit from strengthening)  See FIM for current functional status  Refer to Care Plan for Long Term Goals  Recommendations for other services: None  Discharge Criteria: Patient will be discharged from OT if patient refuses treatment 3 consecutive times without medical reason, if treatment goals not met, if there is a change in medical status, if patient makes no progress towards goals or if patient is discharged from hospital.  The above assessment, treatment plan, treatment alternatives and goals were discussed and mutually agreed upon: by patient  --------------------------------------------------------------------------------------------------------------  SESSION NOTES  Session #1 1610-9604 - 60  Minutes Individual Therapy No complaints of pain Initial 1:1 occupational therapy evaluation completed. Focused skilled intervention on bed mobility, UB/LB dressing, lateral leans to pull pants up to waist, sit->squat, donning of prosthetic, and overall activity tolerance/endurance. At end of session left patient in bed with call bell & phone within reach.   Session #2 1330-1400 - 30 Minutes Individual Therapy No complaints of pain Patient found seated in w/c. Prosthetic tech in room for education regarding donning/doffing of prosthesis. Remainer of session focused on education to patient's wife regarding donning/doffing of prosthesis. Also discussed patient's PTA status with patient's wife.   Florestine Carmical 01/29/2012, 11:11 AM

## 2012-01-29 NOTE — Progress Notes (Signed)
Subjective:  Patient moved to rehab yesterday "Waiting to get started" For dialysis later today  Objective:     Vital signs in last 24 hours: Filed Vitals:   01/28/12 1616 01/28/12 2321  BP: 119/68 110/58  Pulse: 81 78  Temp: 97.6 F (36.4 C)   SpO2: 93%    Weight change:   Intake/Output Summary (Last 24 hours) at 01/29/12 0820 Last data filed at 01/28/12 1900  Gross per 24 hour  Intake    240 ml  Output      0 ml  Net    240 ml    Physical Exam:  Blood pressure 110/58, pulse 78, temperature 97.6 F (36.4 C), SpO2 93.00%. Lungs clear to A S1S2 no S3 2/6 murmur heard across precordium  No diastolic murmur Soft abdomen Non tender Left BK stump Right foot dressed  Right AVF good bruit (still has bandaids from last HD) Labs:   Lab 01/26/12 0733 01/25/12 0445 01/24/12 0741 01/23/12 0650  NA 133* 137 135 133*  K 4.1 4.0 4.3 4.7  CL 92* 95* 93* 92*  CO2 26 29 26 25   GLUCOSE 125* 117* 84 135*  BUN 50* 31* 50* 34*  CREATININE 8.22* 6.02* 8.51* 6.34*  ALB -- -- -- --  CALCIUM 8.7 8.9 8.6 8.7  PHOS 4.0 -- 4.4 --     Lab 01/26/12 0733 01/24/12 0741  AST -- --  ALT -- --  ALKPHOS -- --  BILITOT -- --  PROT -- --  ALBUMIN 2.4* 2.7*   No results found for this basename: LIPASE:3,AMYLASE:3 in the last 168 hours No results found for this basename: AMMONIA:3 in the last 168 hours   Lab 01/28/12 0822 01/27/12 0630 01/26/12 0232 01/25/12 0445  WBC 13.7* 14.1* 10.7* 12.9*  NEUTROABS -- -- -- --  HGB 10.0* 10.9* 8.9* 10.2*  HCT 31.3* 34.1* 27.1* 32.3*  MCV 90.7 91.7 91.6 91.5  PLT 310 310 234 263    @labrcntip (inr:5)  No results found for this basename: CKTOTAL:5,CKMB:5,CKMBINDEX:5,TROPONINI:5 in the last 168 hours   Lab 01/29/12 0717 01/28/12 2206 01/28/12 1702 01/28/12 0750 01/28/12 0120  GLUCAP 105* 146* 139* 108* 133*     No results found for this basename: IRON:30,TIBC:30,TRANSFERRIN:30,FERRITIN:30 in the last 168 hours  Studies/Results: No results  found.       [name] allopurinol  150 mg Oral Daily  . calcium carbonate  1 tablet Oral TID WC  . darbepoetin (ARANESP) injection - DIALYSIS  150 mcg Intravenous Q Tue-HD  . ferric gluconate (FERRLECIT/NULECIT) IV  125 mg Intravenous Q Thu-HD  . insulin aspart  0-9 Units Subcutaneous TID WC  . metoprolol tartrate  12.5 mg Oral BID  . multivitamin  1 tablet Oral QHS  . simvastatin  40 mg Oral QHS  . Warfarin - Pharmacist Dosing Inpatient   Does not apply q1800     I  have reviewed scheduled and prn medications. Dialysis Orders: Center: [facility] on TTS .  EDW 106 kg HD Bath 2.0 k, 2.25 ca Time 4 hrs 15 min Heparin 3,400units. Access Right upper arm avf BFR 500 DFR 800 Hectoral 4 mcg IV/HD Epogen 1000 on tue and sat IV/HD Venofer 100mg  q weekly hd  Other 0   ASSESSMENT/RECOMMENDATIONS  1. Gangrene R foot, s/p 2nd toe amp 1/17- afebrile - antibotics d/c'd yesterday 2. ESRD, cont tts HD 3. HTN/volume-Metoprolol 12.5 mg bid  Plus volume control 4. Anemia - 9.5 >9.7> 10.2>8.9> 10.9>10 hgb on weekly iron and on  Aranesp 150 mg weekly hd 5. MBD - Ca and phos in normal range, cont vit D and tums as binder 6. Atrial Fib Pharmacy dosing coumadin; INR therapeutic; heparin to stop today / metoprolol bid 7. CAD Sp CAGB 1996/ Ho mod. Aortic Stenosis not surgical candidate / card following 8. DM Type 2 = per admit team   Alan Bal, MD [facility] [phone] Pager 01/29/2012, 8:20 AM

## 2012-01-29 NOTE — Progress Notes (Signed)
Physical Therapy Session Note  Patient Details  Name: Alan [name] MRN: [id number] Date of Birth: 07-30-1931  Today's Date: 01/29/2012 Time: 1478-2956 Time Calculation (min): 40 min  Short Term Goals: Week 1:  PT Short Term Goal 1 (Week 1): Pt will transfer with min A bed <-> chair PT Short Term Goal 2 (Week 1): Pt will propel w/c x 150' on unit  with S PT Short Term Goal 3 (Week 1): Pt will be able to manage w/c parts and slideboard for transfers with S  Skilled Therapeutic Interventions/Progress Updates:    Pt's wife present during session. Reviewed goals and WB status (through R heel only) with pt and pt's wife; wife verbalized understanding but pt with little evidence of carryover. Pt appears to have increased cognitive difficulties this afternoon (also noted by wife) and poor motor planning to complete transfer with slideboard back to bed to prepare for dialysis. Required a significant amount of time, max verbal and tactile cues, and max/total A for transfer. Repositioned in bed with assistance from pt's wife. She reports his cognitive deficits present since toe amputation and she is concerned; she stated he was able to transfer independently without slide board prior to admission and could manage w/c parts independently. Education and support provided.   Therapy Documentation Precautions:  Precautions Precautions: Fall Precaution Comments: L prosthesis new for pt; R foot WB through heel only; plan to get Darco shoe but post op shoe for now Required Braces or Orthoses: Other Brace/Splint Other Brace/Splint: Post-up shoe for right foot Restrictions Weight Bearing Restrictions: No   Pain: No complaints of pain.  See FIM for current functional status  Therapy/Group: Individual Therapy  [name] [facility] 01/29/2012, 4:29 PM

## 2012-01-29 NOTE — Progress Notes (Signed)
I have personally attended this patient's dialysis session.  Right upper AVF BFR 400.  BP stable.  No probs thus far.    Alan [name] B

## 2012-01-29 NOTE — Progress Notes (Signed)
Orthopedic Tech Progress Note Patient Details:  Alan [name] 1931/08/07 [id number]  Ortho Devices Type of Ortho Device: Darco shoe Ortho Device/Splint Location: RIGHT DARCO SHOE Ortho Device/Splint Interventions: Application   Cammer, Mickie Bail 01/29/2012, 3:50 PM

## 2012-01-29 NOTE — Progress Notes (Signed)
Social Work  Social Work Assessment and Plan  Patient Details  Name: Alan [name] MRN: [id number] Date of Birth: 1932-01-06  Today's Date: 01/29/2012  Problem List:  Patient Active Problem List  Diagnosis  . Encounter for long-term (current) use of anticoagulants  . Aortic stenosis  . CAD (coronary artery disease)  . End stage renal disease on dialysis  . Sepsis  . Toxic metabolic encephalopathy  . Diabetes mellitus  . Chronic anticoagulation  . Hypotension  . Atherosclerosis of native arteries of the extremities with ulceration(440.23)  . Aftercare following surgery of the circulatory system, NEC  . SIRS (systemic inflammatory response syndrome)  . Wound infection  . Unilateral complete BKA  . Atrial fibrillation  . Pre-operative cardiovascular examination  . Hx of BKA   Past Medical History:  Past Medical History  Diagnosis Date  . Diabetes mellitus   . Gout   . Hypertension   . Hyperlipidemia   . Chronic kidney disease     Started dialysis October 2012  . Arthritis   . Prostate cancer     s/p seed implant  . Cataract     BILATERAL-BEEN REMOVED  . Neuromuscular disorder     DABETIC NEUROPATHY-LOWER EXTREMITY  . Atrial fibrillation     on coumadin  . Aortic stenosis     moderate to severe, not felt to be a surgical candidate  . Anemia     secondary to end stage renal disease  . Coronary artery disease     remote CABG in 1996; last stress test in 2005; last cath 2005  . Diabetic neuropathy   . Edema   . PAD (peripheral artery disease)   . Diabetic retinopathy(362.0)   . Chronic anticoagulation     on coumadin  . Nephrolithiasis     prior history of percutaneous nephrostomy  . Shortness of breath    Past Surgical History:  Past Surgical History  Procedure Date  . Ventral hernia repair   . Inguinal hernia repair     right side  . Colonoscopy   . Kidney stone surgery   . Polypectomy   . Tonsillectomy   . Toe surgery     removal little toe  right foot  . Cardiac catheterization 04/19/2003    SEVERE 2 VESSEL OBSTRUCTIVE ATHERSCLEROTIC CAD  . Coronary artery bypass graft 1996    LIMA GRAFT TO THE LAD, SEQUENTIAL SAPHENOUS  VEIN GRAFT TO THE FIRST AND SECOND DIAGONIAL BRANCHES, SEQUENTIAL SAPHENOUS VEIN GRAFT TO THE ACUTE MARGINAL, POSTERIOR DESCENDING, AND POSTERIOR LATERAL BRANCHES OF THE RIGHT CORONARY ARTERY  . Radioactive seed implant   . US echocardiography 09/06/2009    EF 55-60%  . Cardiovascular stress test 04/13/2003    EF 54%. EVIDENCE OF ANTERO-APICAL ISCHEMIA. NORMAL LV SIZE AND FUNCTION  . Tee without cardioversion 05/09/2011    Procedure: TRANSESOPHAGEAL ECHOCARDIOGRAM (TEE);  Surgeon: Laurey Morale, MD;  Location: [facility] ENDOSCOPY;  Service: Cardiovascular;  Laterality: N/A;  . Hernia repair   . Amputation 07/20/2011    Procedure: AMPUTATION BELOW KNEE;  Surgeon: Nada Libman, MD;  Location: [facility] OR;  Service: Vascular;  Laterality: Left;  . Amputation 01/25/2012    Procedure: AMPUTATION DIGIT;  Surgeon: Chuck Hint, MD;  Location: [facility] OR;  Service: Vascular;  Laterality: Right;  second   Social History:  reports that he quit smoking about 43 years ago. His smoking use included Cigarettes. He quit after 2 years of use. He has quit using smokeless tobacco. His  smokeless tobacco use included Chew. He reports that he does not drink alcohol or use illicit drugs.  Family / Support Systems Marital Status: Married Patient Roles: Spouse;Parent Spouse/Significant Other: wife, Sherryll Burger @ (H) [phone] Children: step - son, Iantha Fallen,  living at home  @ (C) [phone] plust two of pt's  own children living locally Anticipated Caregiver: Wife: Network engineer (Pt has daughter and son in town)) Ability/Limitations of Caregiver: min assist Caregiver Availability: 24/7 Family Dynamics: Wife reports that her son will assist at home and that pt's children are also supportive.  Wife and pt appear frustrated with one another at times  during interview i.e. scowling at one another, correcting each other in angry tone.  Social History Preferred language: English Religion: Baptist Cultural Background: NA Education: HS Read: Yes Write: Yes Employment Status: Retired Fish farm manager Issues: None Guardian/Conservator: None - pt able to make decision on his own behalf   Abuse/Neglect Physical Abuse: Denies Verbal Abuse: Denies Sexual Abuse: Denies Exploitation of patient/patient's resources: Denies Self-Neglect: Denies  Emotional Status Pt's affect, behavior adn adjustment status: For most of interview, pt keeps head bowed and with little eye contact.  Offers very brief answers to basic questions.  Difficult to determine if he is fully understanding questions  being asked.  Wife often jumps in to answer for him.  Pt appears very disengaged.  Will discuss with tx team - may need to adminimster depression screen or request neuropsych consult. Recent Psychosocial Issues: BKA in July 2013 with CIR stay.   Pyschiatric History: None Substance Abuse History: None  Patient / Family Perceptions, Expectations & Goals Pt/Family understanding of illness & functional limitations: Pt and wife with basic understanding of reasons for toe amputation and of current functional limitations.  Attempting to clarify weight-bearing restrictions upon admit to CIR Premorbid pt/family roles/activities: Pt and wife note that he was failry independent from a w/c level PTA and with transfers.  Out of the home 3 x/ week for HD.  Limited community beyond that. Anticipated changes in roles/activities/participation: Pt and wife hopeful he will reach a functional level for transfers with prosthesis.  Wife to resume caregiver role as PTA Pt/family expectations/goals: Wife hopeful, "...they will teach him how to walk with that"... therapies have discussed that true ambulation will be more focus of outpatient therapies  [employer] Agencies: Other (Comment) (HD at [facility]) Premorbid Home Care/DME Agencies: Other (Comment) ([facility] after BKA) Transportation available at discharge: yes Resource referrals recommended: Support group (specify)  Discharge Planning Living Arrangements: Spouse/significant other;Children Support Systems: Spouse/significant other;Children Type of Residence: Private residence Insurance Resources: Armed forces operational officer (with Brewing technologist) Surveyor, quantity Resources: Restaurant manager, fast food Screen Referred: No Living Expenses: Database administrator Management: Spouse Do you have any problems obtaining your medications?: No Home Management: wife and step-son Patient/Family Preliminary Plans: Pt plans to return home with wife and step-son Social Work Anticipated Follow Up Needs: HH/OP;Support Group Expected length of stay: 7 daysw  Clinical Impression Familiar gentleman to CIR (here 07/2011) who returns now with toe amputation and had recently received prosthesis.  Pt and wife aware focus to be on transfer training.  Wife and step-son were assisting pt PTA and plan to resume.  No significant emotional distress noted but will monitor.  Izear Pine 01/29/2012, 4:08 PM

## 2012-01-29 NOTE — Therapy (Signed)
Nursing Tech notified this supervisor that patient and wife were concerned about therapy interventions and patient's current use of prosthesis for LLE.  Met with patient and wife (Child psychotherapist present at time also).  Listened to concerns and reviewed with patient and wife need to address pre-courser skills to gait prior to attempting to ambulate patient (patient currently needing max assist for sliding board transfer and max a for sit to stand in parallel bars for approximately 30 seconds).  Explained that all prosthesis were different and that we have arranged for Peyton Najjar from [employer] to come in today and show the therapists how to don prosthesis.  Peyton Najjar will also review this again with patient and wife.  Stressed to wife the importance of them knowing how to don prosthesis so that they can show any future health care workers.  Wife in agreement.  Therapist and PA are also awaiting clarification on weight bearing for right LE due to recent toe amputation from vascular.  Also explained our role in assisting the patient in getting to a level where he can be managed safely at home by wife and that we will first need to focus on bed mobility, sitting balance, basic transfers, sit to stand and standing before patient will be ready for gait training.  Explained that the bulk of the gait training with the prosthesis will take place at our outpatient center in the amputee program once patient is able to return home and resume outpatient therapies.  Wife verbalized understanding and appears in agreement with plan.  Patient very sleepy and slept through most of the conversation.

## 2012-01-29 NOTE — Evaluation (Signed)
Physical Therapy Assessment and Plan  Patient Details  Name: Alan [name] MRN: [id number] Date of Birth: Jul 23, 1931  PT Diagnosis: Difficulty walking, Impaired cognition, Impaired sensation, Muscle weakness and Pain in RLE Rehab Potential: Good ELOS: 10-12 days   Today's Date: 01/29/2012 Time: 0930-1030 Time Calculation (min): 60 min  Problem List:  Patient Active Problem List  Diagnosis  . Encounter for long-term (current) use of anticoagulants  . Aortic stenosis  . CAD (coronary artery disease)  . End stage renal disease on dialysis  . Sepsis  . Toxic metabolic encephalopathy  . Diabetes mellitus  . Chronic anticoagulation  . Hypotension  . Atherosclerosis of native arteries of the extremities with ulceration(440.23)  . Aftercare following surgery of the circulatory system, NEC  . SIRS (systemic inflammatory response syndrome)  . Wound infection  . Unilateral complete BKA  . Atrial fibrillation  . Pre-operative cardiovascular examination  . Hx of BKA    Past Medical History:  Past Medical History  Diagnosis Date  . Diabetes mellitus   . Gout   . Hypertension   . Hyperlipidemia   . Chronic kidney disease     Started dialysis October 2012  . Arthritis   . Prostate cancer     s/p seed implant  . Cataract     BILATERAL-BEEN REMOVED  . Neuromuscular disorder     DABETIC NEUROPATHY-LOWER EXTREMITY  . Atrial fibrillation     on coumadin  . Aortic stenosis     moderate to severe, not felt to be a surgical candidate  . Anemia     secondary to end stage renal disease  . Coronary artery disease     remote CABG in 1996; last stress test in 2005; last cath 2005  . Diabetic neuropathy   . Edema   . PAD (peripheral artery disease)   . Diabetic retinopathy(362.0)   . Chronic anticoagulation     on coumadin  . Nephrolithiasis     prior history of percutaneous nephrostomy  . Shortness of breath    Past Surgical History:  Past Surgical History  Procedure  Date  . Ventral hernia repair   . Inguinal hernia repair     right side  . Colonoscopy   . Kidney stone surgery   . Polypectomy   . Tonsillectomy   . Toe surgery     removal little toe right foot  . Cardiac catheterization 04/19/2003    SEVERE 2 VESSEL OBSTRUCTIVE ATHERSCLEROTIC CAD  . Coronary artery bypass graft 1996    LIMA GRAFT TO THE LAD, SEQUENTIAL SAPHENOUS  VEIN GRAFT TO THE FIRST AND SECOND DIAGONIAL BRANCHES, SEQUENTIAL SAPHENOUS VEIN GRAFT TO THE ACUTE MARGINAL, POSTERIOR DESCENDING, AND POSTERIOR LATERAL BRANCHES OF THE RIGHT CORONARY ARTERY  . Radioactive seed implant   . US echocardiography 09/06/2009    EF 55-60%  . Cardiovascular stress test 04/13/2003    EF 54%. EVIDENCE OF ANTERO-APICAL ISCHEMIA. NORMAL LV SIZE AND FUNCTION  . Tee without cardioversion 05/09/2011    Procedure: TRANSESOPHAGEAL ECHOCARDIOGRAM (TEE);  Surgeon: Laurey Morale, MD;  Location: [facility] ENDOSCOPY;  Service: Cardiovascular;  Laterality: N/A;  . Hernia repair   . Amputation 07/20/2011    Procedure: AMPUTATION BELOW KNEE;  Surgeon: Nada Libman, MD;  Location: [facility] OR;  Service: Vascular;  Laterality: Left;  . Amputation 01/25/2012    Procedure: AMPUTATION DIGIT;  Surgeon: Chuck Hint, MD;  Location: [facility] OR;  Service: Vascular;  Laterality: Right;  second    Assessment & Plan  Clinical Impression: Patient is a 79 y.o. year old male with recent admission to the hospital with history of end-stage renal disease with hemodialysis, diabetes mellitus peripheral neuropathy, atrial fibrillation with chronic Coumadin and left BKA July 2013 and received inpatient rehabilitation services and recently received his prosthesis from [employer] with limited prosthetic teaching thus far. Admitted 01/22/2012 with chronic wound over right second toe with gangrenous changes. No change with conservative care and underwent open ray amputation of right second toe 01/25/2012 per Dr. Edilia Bo. Fitted with postoperative shoe and  question weightbearing status. Postoperative pain management. Patient remains on hemodialysis as per renal services. Chronic Coumadin resumed for atrial fibrillation. Chronic anemia with latest hemoglobin 10.9 and remains on Aranesp. Patient transferred to CIR on 01/28/2012 .   Patient currently requires mod with mobility secondary to muscle weakness and muscle joint tightness, decreased cardiorespiratoy endurance, decreased problem solving, decreased safety awareness and decreased memory and decreased standing balance, decreased balance strategies and difficulty maintaining precautions.  Prior to hospitalization, patient was supervision with mobility at w/c level and lived with Spouse in a House.  Home access is 2Stairs to enter;Ramped entrance.  Patient will benefit from skilled PT intervention to maximize safe functional mobility, minimize fall risk and decrease caregiver burden for planned discharge home with 24 hour supervision.  Anticipate patient will benefit from follow up HH at discharge.  PT Assessment Rehab Potential: Good Barriers to Discharge: Decreased caregiver support PT Plan PT Intensity: Minimum of 1-2 x/day ,45 to 90 minutes PT Frequency: 5 out of 7 days PT Duration Estimated Length of Stay: 10-12 days PT Treatment/Interventions: Ambulation/gait training;Balance/vestibular training;Cognitive remediation/compensation;Community reintegration;Discharge planning;Disease management/prevention;DME/adaptive equipment instruction;Functional mobility training;Neuromuscular re-education;Pain management;Patient/family education;Psychosocial support;Skin care/wound management;Splinting/orthotics;Stair training;Therapeutic Activities;Therapeutic Exercise;UE/LE Strength taining/ROM;UE/LE Coordination activities;Wheelchair propulsion/positioning PT Recommendation Follow Up Recommendations: Home health PT;24 hour supervision/assistance Patient destination: Home Equipment Recommended: None  recommended by PT (may need adjustments to w/c now with prosthesis)  PT Evaluation Precautions/Restrictions Precautions Precautions: Fall Precaution Comments: L prosthesis new for pt; R foot with post-op shoe OOB and WB status still needs to be confirmed Required Braces or Orthoses: Other Brace/Splint Other Brace/Splint: Post-up shoe for right foot Restrictions Weight Bearing Restrictions: No Pain Pain Assessment Pain Assessment: No/denies pain Pain Score: 0-No pain Faces Pain Scale: No hurt Home Living/Prior Functioning Home Living Lives With: Spouse Available Help at Discharge: Family;Available 24 hours/day (wife only able to do S) Type of Home: House Home Access: Stairs to enter;Ramped entrance Entrance Stairs-Number of Steps: 2 Entrance Stairs-Rails: Right;Left Home Layout: One level Bathroom Shower/Tub: Walk-in shower;Door Bathroom Toilet: Standard Bathroom Accessibility: No (pt reports w/c does not fit) Home Adaptive Equipment: Bedside commode/3-in-1;Wheelchair - manual;Walker - rolling;Straight cane;Other (comment) (sliding board) Prior Function Level of Independence: Needs assistance with ADLs;Needs assistance with homemaking;Independent with transfers;Requires assistive device for independence Vocation: Retired Leisure: Hobbies-yes (Comment) Comments: Enjoys going to football games, watch TV Vision/Perception  Vision - History Baseline Vision: Wears glasses only for reading Patient Visual Report: No change from baseline Vision - Assessment Vision Assessment: Vision not tested Perception Perception: Within Functional Limits Praxis Praxis: Intact  Cognition Overall Cognitive Status: Impaired at baseline Arousal/Alertness: Awake/alert Orientation Level: Oriented X4 Memory: Impaired Awareness: Impaired Problem Solving: Impaired Safety/Judgment: Impaired Sensation Sensation Light Touch: Impaired by gross assessment (reports numbness in LE's) Additional  Comments: bilateral UEs appear intact Coordination Gross Motor Movements are Fluid and Coordinated: Yes Fine Motor Movements are Fluid and Coordinated: Yes Motor  Motor Motor: Within Functional Limits    Trunk/Postural Assessment  Cervical Assessment Cervical Assessment: Within Functional Limits Thoracic Assessment Thoracic Assessment: Exceptions to [facility] (kyphotic posture) Lumbar Assessment Lumbar Assessment: Exceptions to [facility] (posterior tilt)  Balance Balance Balance Assessed: Yes Static Sitting Balance Static Sitting - Level of Assistance: 5: Stand by assistance Dynamic Sitting Balance Dynamic Sitting - Level of Assistance: 5: Stand by assistance Static Standing Balance Static Standing - Level of Assistance: 3: Mod assist Extremity Assessment  RUE Assessment RUE Assessment: Within Functional Limits (can benefit from strengthening) LUE Assessment LUE Assessment: Within Functional Limits (can benefit from strengthening) RLE Assessment RLE Assessment: Exceptions to [facility] (ankle NT due to boot and bandage; strength grossly 4-/5) LLE Assessment LLE Assessment: Exceptions to WFL (L BKA; ROM WFL; strength grossly 3+/5)  FIM:  FIM - Bed/Chair Transfer Bed/Chair Transfer Assistive Devices: Arm rests;Bed rails Bed/Chair Transfer: 4: Supine > Sit: Min A (steadying Pt. > 75%/lift 1 leg);4: Sit > Supine: Min A (steadying pt. > 75%/lift 1 leg);3: Bed > Chair or W/C: Mod A (lift or lower assist) FIM - Locomotion: Wheelchair Locomotion: Wheelchair: 2: Travels 50 - 149 ft with minimal assistance (Pt.>75%) FIM - Locomotion: Ambulation Locomotion: Ambulation: 0: Activity did not occur (unable to assess) FIM - Locomotion: Stairs Locomotion: Stairs: 0: Activity did not occur (unsafe)   Refer to Care Plan for Long Term Goals  Recommendations for other services: None  Discharge Criteria: Patient will be discharged from PT if patient refuses treatment 3 consecutive times without medical  reason, if treatment goals not met, if there is a change in medical status, if patient makes no progress towards goals or if patient is discharged from hospital.  The above assessment, treatment plan, treatment alternatives and goals were discussed and mutually agreed upon: by patient  Individual therapy initiated with focus on sit to stands, transfer techniques, and w/c mobility.  [name] [facility] 01/29/2012, 10:49 AM

## 2012-01-29 NOTE — Progress Notes (Signed)
Subjective/Complaints: Slept well. Pain under control. Had some problems with a blood stick last night--otherwise no complaints.   Objective: Vital Signs: Blood pressure 110/58, pulse 78, temperature 97.6 F (36.4 C), SpO2 93.00%. No results found.  Basename 01/28/12 0822 01/27/12 0630  WBC 13.7* 14.1*  HGB 10.0* 10.9*  HCT 31.3* 34.1*  PLT 310 310   No results found for this basename: NA:2,K:2,CL:2,CO:2,GLUCOSE:2,BUN:2,CREATININE:2,CALCIUM:2 in the last 72 hours CBG (last 3)   Basename 01/29/12 0717 01/28/12 2206 01/28/12 1702  GLUCAP 105* 146* 139*    Wt Readings from Last 3 Encounters:  01/27/12 103.692 kg (228 lb 9.6 oz)  01/27/12 103.692 kg (228 lb 9.6 oz)  12/12/11 107.049 kg (236 lb)    Physical Exam:  HENT: mucosa pink  Head: Normocephalic.  Eyes:  Pupils round and reactive to light  Neck: Neck supple. No thyromegaly present.  Cardiovascular:  Cardiac rate controlled, with systolic murmur, no rub.  Pulmonary/Chest: Effort normal and breath sounds normal. No respiratory distress. No wheezes  Abdominal: Soft. Bowel sounds are normal. Non-distended Neurological: He is more alert and appropriate today. He follows simple commands. UE's 4/5. LLE 3/5 HF, RLE grossly 2+ to 3/5 with some limitations due to pain. Skin:  Left BKA is well healed. dry dressing in place over right 2nd toe with minimal serosanginous dc. Mild ulceration over lateral patella (left) Psychiatric:  Very flat. Slow to respond    Assessment/Plan: 1. Functional deficits secondary to right second toe amputation with a history of left BKA (pt just fitted with a prosthesis) which require 3+ hours per day of interdisciplinary therapy in a comprehensive inpatient rehab setting. Physiatrist is providing close team supervision and 24 hour management of active medical problems listed below. Physiatrist and rehab team continue to assess barriers to discharge/monitor patient progress toward functional and  medical goals. FIM:                   Comprehension Comprehension Mode: Auditory Comprehension: 5-Follows basic conversation/direction: With extra time/assistive device  Expression Expression Mode: Verbal Expression: 5-Expresses basic needs/ideas: With extra time/assistive device  Social Interaction Social Interaction: 5-Interacts appropriately 90% of the time - Needs monitoring or encouragement for participation or interaction.  Problem Solving Problem Solving: 5-Solves basic 90% of the time/requires cueing < 10% of the time  Memory Memory: 5-Recognizes or recalls 90% of the time/requires cueing < 10% of the time  Medical Problem List and Plan:  1. Right second toe amputation 01/25/2012 secondary to gangrenous changes/history of left BKA and recent receive prosthesis. Need to confirm weightbearing status for right lower extremity/patient is wearing a shoe boot  2. DVT Prophylaxis/Anticoagulation: Chronic Coumadin therapy for atrial fibrillation. Continue heparin therapy until INR therapeutic. Monitor for any bleeding episodes  3. Pain Management: Percocet as needed. Fair control at present  4. Neuropsych: This patient is capable of making decisions on his/her own behalf.  5. End stage renal disease/hemodialysis. Followup per renal services. Latest creatinine 8.22.  6. Diabetes mellitus with peripheral neuropathy. Latest hemoglobin A1c of 6.3. Continue sliding scale insulin and check blood sugars a.c. and at bedtime. Patient on Amaryll 4 mg daily prior to admission. Resume oral agents as needed  -right now cbg's are reasonable 7. Chronic anemia. Aranesp as directed as well as ferric gluconate. Latest hemoglobin 10.0. Followup labs with dialysis  8. Gout. Zyloprim. Monitor for any signs of flareup  9. Hypertension/atrial fibrillation. Lopressor 12.5 mg twice a day. Monitor with increased activity  10. Hyperlipidemia. Zocor  11. Wound care: continue dry dressing right 2nd  toe 12. Leukocytosis: wbc's trending down  LOS (Days) 1 A FACE TO FACE EVALUATION WAS PERFORMED  Tynika Luddy T 01/29/2012 7:42 AM

## 2012-01-29 NOTE — Progress Notes (Signed)
  ANTICOAGULATION CONSULT NOTE - Follow Up Consult  Pharmacy Consult for couamdin Indication: atrial fibrillation  Allergies  Allergen Reactions  . Ancef (Cefazolin) Rash    Patient Measurements:   Heparin Dosing Weight:   Vital Signs: BP: 96/40 mmHg (01/21 0826) Pulse Rate: 78  (01/20 2321)  Labs:  Basename 01/29/12 0555 01/28/12 1530 01/28/12 0822 01/28/12 0022 01/27/12 0630  HGB -- -- 10.0* -- 10.9*  HCT -- -- 31.3* -- 34.1*  PLT -- -- 310 -- 310  APTT -- -- -- -- --  LABPROT 28.8* -- 23.3* -- 18.1*  INR 2.90* -- 2.18* -- 1.55*  HEPARINUNFRC -- <0.10* 0.12* 0.35 --  CREATININE -- -- -- -- --  CKTOTAL -- -- -- -- --  CKMB -- -- -- -- --  TROPONINI -- -- -- -- --    The CrCl is unknown because both a height and weight (above a minimum accepted value) are required for this calculation.   Medications:  Scheduled:    . allopurinol  150 mg Oral Daily  . calcium carbonate  1 tablet Oral TID WC  . darbepoetin (ARANESP) injection - DIALYSIS  150 mcg Intravenous Q Tue-HD  . ferric gluconate (FERRLECIT/NULECIT) IV  125 mg Intravenous Q Thu-HD  . insulin aspart  0-9 Units Subcutaneous TID WC  . metoprolol tartrate  12.5 mg Oral BID  . multivitamin  1 tablet Oral QHS  . simvastatin  40 mg Oral QHS  . warfarin  1 mg Oral ONCE-1800  . [COMPLETED] warfarin  2 mg Oral ONCE-1800  . Warfarin - Pharmacist Dosing Inpatient   Does not apply q1800  . [DISCONTINUED] warfarin  5 mg Oral ONCE-1800   Infusions:    Assessment: 79 yo male with hx of afib is currently on therapeutic coumadin.  INR jumped to 2.9 from 2.18 today.  Expecting INR to go up even more tom after the 5mg  dose few days ago. Goal of Therapy:  INR 2-3    Plan:  1) Coumadin 1mg  po x1 2) INR in am  Kyandre Okray, [name] 01/29/2012,8:47 AM

## 2012-01-30 ENCOUNTER — Ambulatory Visit: Payer: MEDICARE | Admitting: Physical Therapy

## 2012-01-30 ENCOUNTER — Inpatient Hospital Stay (HOSPITAL_COMMUNITY): Payer: MEDICARE | Admitting: Occupational Therapy

## 2012-01-30 ENCOUNTER — Inpatient Hospital Stay (HOSPITAL_COMMUNITY): Payer: MEDICARE

## 2012-01-30 ENCOUNTER — Inpatient Hospital Stay (HOSPITAL_COMMUNITY): Payer: MEDICARE | Admitting: Physical Therapy

## 2012-01-30 LAB — GLUCOSE, CAPILLARY
Glucose-Capillary: 186 mg/dL — ABNORMAL HIGH (ref 70–99)
Glucose-Capillary: 145 mg/dL — ABNORMAL HIGH (ref 70–99)

## 2012-01-30 MED ORDER — ASPIRIN EC 81 MG PO TBEC
81.0000 mg | DELAYED_RELEASE_TABLET | Freq: Every day | ORAL | Status: DC
  Administered 2012-01-31 – 2012-02-06 (×7): 81 mg via ORAL
  Filled 2012-01-30 (×8): qty 1

## 2012-01-30 MED ORDER — OXYCODONE-ACETAMINOPHEN 5-325 MG PO TABS
1.0000 | ORAL_TABLET | Freq: Four times a day (QID) | ORAL | Status: DC | PRN
  Administered 2012-01-30 – 2012-02-05 (×3): 1 via ORAL
  Filled 2012-01-30 (×3): qty 1

## 2012-01-30 MED ORDER — WARFARIN SODIUM 2 MG PO TABS
2.0000 mg | ORAL_TABLET | Freq: Once | ORAL | Status: AC
  Administered 2012-01-30: 2 mg via ORAL
  Filled 2012-01-30: qty 1

## 2012-01-30 MED ORDER — DOXERCALCIFEROL 4 MCG/2ML IV SOLN
4.0000 ug | INTRAVENOUS | Status: DC
  Administered 2012-02-02 – 2012-02-06 (×2): 4 ug via INTRAVENOUS
  Filled 2012-01-30 (×3): qty 2

## 2012-01-30 NOTE — Progress Notes (Signed)
Occupational Therapy Session Notes  Patient Details  Name: Alan [name] MRN: [id number] Date of Birth: 1931-03-19  Today's Date: 01/30/2012  Short Term Goals: Week 1:  OT Short Term Goal 1 (Week 1): Patient will perform LB dressing with minimal assistance at bed level OT Short Term Goal 2 (Week 1): Patient will perform UB/LB bathing with minimal assistance at bed level OT Short Term Goal 3 (Week 1): Patient will perform toilet transfer with moderate assistance OT Short Term Goal 4 (Week 1): Patient will be able to donn prosthetic with mod verbal cues  Skilled Therapeutic Interventions/Progress Updates:   Session #1 [phone] - 55 Minutes Individual Therapy No complaints of pain Patient found supine in bed and irritable. Patient on phone ~5 minutes and when therapist politely asked patient if he was ready for therapy he yelled, "Can't you see I'm on the phone?". Therapist explained therapy and that we had a certain amount of time. Patient got off phone and therapist handed patient wash cloth to start bathing. Patient performed LB bathing & dressing supine in bed, therapist donned prosthesis, then patient transferred edge of bed -> w/c with moderate assistance (scoot pivot transfer) and sat at sink for UB bathing & dressing. Throughout session, patient aggravated that therapist wouldn't do things for him (open deodorant and wash UB), patient stated "Why are you here if you're not going to do it for me?". Therapist explained role of therapy, patient didn't seem to understand. At end of session therapist donned bilateral leg rests and left patient seated in w/c beside bed with call bell & phone within reach.   Session #2 [id number] - 45 Minutes Individual Therapy No complaints of pain Patient found seated in w/c upon entering room. Focused skilled intervention on toilet transfer w/c <-> drop arm BSC seated over elevated toilet seat. Patient required max assist for transfer and total assist  for toileting aspects (peri care and clothing management). Patient sat at sink for grooming task of washing hands. Therapist then encouraged patient to attempt a w/c pushup, patient required max encouragement to try just one and had difficult time completing the one w/c pushup. At end of session left patient seated in w/c with bilateral leg rests donned, wife present in room entire session.   Precautions:  Precautions Precautions: Fall Precaution Comments: L prosthesis new for pt; R foot WB through heel only; plan to get Darco shoe but post op shoe for now Required Braces or Orthoses: Other Brace/Splint Other Brace/Splint: Post-up shoe for right foot Restrictions Weight Bearing Restrictions: No  See FIM for current functional status   Jullianna Gabor 01/30/2012, 8:11 AM

## 2012-01-30 NOTE — Progress Notes (Signed)
ANTICOAGULATION CONSULT NOTE - Follow Up Consult  Pharmacy Consult for coumadin Indication: atrial fibrillation  Allergies  Allergen Reactions  . Ancef (Cefazolin) Rash    Patient Measurements: Weight: 190 lb 0.6 oz (86.2 kg) Heparin Dosing Weight:   Vital Signs: Temp: 98.8 F (37.1 C) (01/22 0529) Temp src: Oral (01/22 0529) BP: 112/63 mmHg (01/22 0529) Pulse Rate: 80  (01/22 0529)  Labs:  Alan [name] 01/30/12 0650 01/29/12 1628 01/29/12 0555 01/28/12 1530 01/28/12 0822 01/28/12 0022  HGB -- 9.3* -- -- 10.0* --  HCT -- 28.8* -- -- 31.3* --  PLT -- 308 -- -- 310 --  APTT -- -- -- -- -- --  LABPROT 26.7* -- 28.8* -- 23.3* --  INR 2.62* -- 2.90* -- 2.18* --  HEPARINUNFRC -- -- -- <0.10* 0.12* 0.35  CREATININE -- 10.30* -- -- -- --  CKTOTAL -- -- -- -- -- --  CKMB -- -- -- -- -- --  TROPONINI -- -- -- -- -- --    The CrCl is unknown because both a height and weight (above a minimum accepted value) are required for this calculation.   Medications:  Scheduled:    . allopurinol  150 mg Oral Daily  . calcium carbonate  1 tablet Oral TID WC  . [EXPIRED] darbepoetin      . darbepoetin (ARANESP) injection - DIALYSIS  150 mcg Intravenous Q Tue-HD  . ferric gluconate (FERRLECIT/NULECIT) IV  125 mg Intravenous Q Thu-HD  . insulin aspart  0-9 Units Subcutaneous TID WC  . metoprolol tartrate  12.5 mg Oral BID  . multivitamin  1 tablet Oral QHS  . simvastatin  40 mg Oral QHS  . warfarin  2 mg Oral ONCE-1800  . Warfarin - Pharmacist Dosing Inpatient   Does not apply q1800  . [DISCONTINUED] warfarin  1 mg Oral ONCE-1800   Infusions:    Assessment: 79 yo male with history of afib is currently on therapeutic coumadin.. Prior to admission coumadin dose was 5mg  daily except 2.5mg  on Fridays, but admitting INR was high.  Wrote for 1mg  coumadin dose yesterday, but dose was not charted and RN didn't know if that was given or not.  INR today was 2.62 Goal of Therapy:  INR 2-3      Plan:  1) Coumadin 2mg  po x1 2) INR in am  Alan [name], Alan [name] 01/30/2012,8:41 AM

## 2012-01-30 NOTE — Progress Notes (Signed)
[facility]  Subjective  - status post right toe amputation  The patient appears to be more comfortable this morning. He has just got back from physical therapy. He is trying to walk with his new prosthesis.   Physical Exam:  The dressing was changed today. There is marginal tissue within the toe amputation site however it does appear viable at this time. There is a mild amount of erythema around the great toe. He has minimal edema.       Assessment/Plan:   This is a very difficult situation for this patient. He has previously undergone amputation of the left leg. He is at high-risk for losing the right leg. He has undergone an attempted percutaneous revascularization x2 of the right leg which has been unsuccessful. He has known tibial occlusion. I feel he is a very poor operative candidate. I have been hoping to get by with just a toe amputation. On examination today his toe has probably about a 5050 chance of healing. At this point I would continue with wound care. I will continue to follow periodically while he remains in rehabilitation the  BRABHAM IV, V. WELLS 01/30/2012 11:49 AM --  Filed Vitals:   01/30/12 0529  BP: 112/63  Pulse: 80  Temp: 98.8 F (37.1 C)  Resp: 17    Intake/Output Summary (Last 24 hours) at 01/30/12 1149 Last data filed at 01/30/12 0700  Gross per 24 hour  Intake    360 ml  Output   2927 ml  Net  -2567 ml     Laboratory CBC    Component Value Date/Time   WBC 14.2* 01/29/2012 1628   HGB 9.3* 01/29/2012 1628   HCT 28.8* 01/29/2012 1628   PLT 308 01/29/2012 1628    BMET    Component Value Date/Time   NA 129* 01/29/2012 1628   K 4.4 01/29/2012 1628   CL 88* 01/29/2012 1628   CO2 23 01/29/2012 1628   GLUCOSE 176* 01/29/2012 1628   BUN 59* 01/29/2012 1628   CREATININE 10.30* 01/29/2012 1628   CALCIUM 8.9 01/29/2012 1628   CALCIUM 8.8 10/27/2010 0734   GFRNONAA 4* 01/29/2012 1628   GFRAA 5* 01/29/2012 1628     COAG Lab Results  Component Value Date   INR 2.62* 01/30/2012   INR 2.90* 01/29/2012   INR 2.18* 01/28/2012   No results found for this basename: PTT    Antibiotics Anti-infectives    None       V. Charlena Cross, M.D. [facility] Office: [phone] Pager:  [phone]

## 2012-01-30 NOTE — Progress Notes (Signed)
Physical Therapy Note  Patient Details  Name: MASE DHONDT MRN: [id number] Date of Birth: 1931-05-31 Today's Date: 01/30/2012  1330-1400 (30 minutes) individual Pain: no reported pain Precautions: PWB RT LE heel only Focus of treatment: transfer training bed mobility training, sit to stand observing precautions as above  Treatment: Transfers - vcs for wc placement, vcs for brakes, sliding board placement ; pt able to scoot sideways on sliding board SBA on level surfaces; sit to supine/supine to sit SBA; sit to stand to RW X 2 for approximately 1 minute or less with mod assist . Pt unable to attain full trunk extension in stance; wc mobility -120 feet SBA.  Arliene Rosenow,JIM 01/30/2012, 7:28 AM

## 2012-01-30 NOTE — Progress Notes (Signed)
Subjective:  Up in the wheelchair No complaints at this time  Objective:     Vital signs in last 24 hours: Filed Vitals:   01/29/12 2042 01/29/12 2126 01/29/12 2244 01/30/12 0529  BP: 127/86 114/41 122/61 112/63  Pulse: 81 81 75 80  Temp: 98.7 F (37.1 C) 99.5 F (37.5 C)  98.8 F (37.1 C)  TempSrc: Oral Oral  Oral  Resp: 18 18  17   Weight:  86 kg (189 lb 9.5 oz)  86.2 kg (190 lb 0.6 oz)  SpO2: 98% 96%  97%   Weight change:   Intake/Output Summary (Last 24 hours) at 01/30/12 1201 Last data filed at 01/30/12 0700  Gross per 24 hour  Intake    360 ml  Output   2927 ml  Net  -2567 ml    Physical Exam:  Blood pressure 112/63, pulse 80, temperature 98.8 F (37.1 C), temperature source Oral, resp. rate 17, weight 86.2 kg (190 lb 0.6 oz), SpO2 97.00%. Lungs clear  S1S2 no S3  2/6 murmur heard across precordium No diastolic murmur  Soft abdomen Non tender  Left BK stump  Right foot dressed  Trace RLE edema Right AVF good bruit   Labs:   Lab 01/29/12 1628 01/26/12 0733 01/25/12 0445 01/24/12 0741  NA 129* 133* 137 135  K 4.4 4.1 4.0 4.3  CL 88* 92* 95* 93*  CO2 23 26 29 26   GLUCOSE 176* 125* 117* 84  BUN 59* 50* 31* 50*  CREATININE 10.30* 8.22* 6.02* 8.51*  ALB -- -- -- --  CALCIUM 8.9 8.7 8.9 8.6  PHOS 4.2 4.0 -- 4.4     Lab 01/29/12 1628 01/26/12 0733 01/24/12 0741  AST -- -- --  ALT -- -- --  ALKPHOS -- -- --  BILITOT -- -- --  PROT -- -- --  ALBUMIN 2.6* 2.4* 2.7*   No results found for this basename: LIPASE:3,AMYLASE:3 in the last 168 hours No results found for this basename: AMMONIA:3 in the last 168 hours   Lab 01/29/12 1628 01/28/12 0822 01/27/12 0630 01/26/12 0232  WBC 14.2* 13.7* 14.1* 10.7*  NEUTROABS -- -- -- --  HGB 9.3* 10.0* 10.9* 8.9*  HCT 28.8* 31.3* 34.1* 27.1*  MCV 89.4 90.7 91.7 91.6  PLT 308 310 310 234    Lab 01/30/12 1137 01/30/12 0718 01/29/12 2105 01/29/12 1110 01/29/12 0717  GLUCAP 186* 157* 118* 156* 105*   Lab  Results  Component Value Date   INR 2.62* 01/30/2012   INR 2.90* 01/29/2012   INR 2.18* 01/28/2012    Studies/Results: No results found.       [name] allopurinol  150 mg Oral Daily  . calcium carbonate  1 tablet Oral TID WC  . darbepoetin (ARANESP) injection - DIALYSIS  150 mcg Intravenous Q Tue-HD  . ferric gluconate (FERRLECIT/NULECIT) IV  125 mg Intravenous Q Thu-HD  . insulin aspart  0-9 Units Subcutaneous TID WC  . metoprolol tartrate  12.5 mg Oral BID  . multivitamin  1 tablet Oral QHS  . simvastatin  40 mg Oral QHS  . warfarin  2 mg Oral ONCE-1800  . Warfarin - Pharmacist Dosing Inpatient   Does not apply q1800   Dialysis Orders: Center: [facility] on TTS .  EDW 106 kg HD Bath 2.0 k, 2.25 ca Time 4 hrs 15 min Heparin 3,400units. Access Right upper arm avf BFR 500 DFR 800 Hectoral 4 mcg IV/HD Epogen 1000 on tue and sat IV/HD Venofer 100mg  q  weekly hd  Other 0    I  have reviewed scheduled and prn medications.  ASSESSMENT/RECOMMENDATIONS  1. Gangrene right foot s/p toe amp 1/17  VVS feels viable but high risk for limb loss with "50/50 chance of healing"  They will follow 2. ESRD  Cont TTS HD 3. HTN/volume  Metoprolol plus HD 4. Anemia  Weekly FE  Aranesp 150 5. AFib  Coumadin per pharmacy 6. CAD/CABG 1996/Mod AS  Not surgical candidate 7. DMII   Per primary 8. CKD-MBD  Binders  ?not on usual Hectorol - restart    Camille Bal, MD [facility] [phone] Pager 01/30/2012, 12:01 PM

## 2012-01-30 NOTE — Progress Notes (Signed)
Physical Therapy Session Note  Patient Details  Name: Alan [name] MRN: [id number] Date of Birth: [birth year]  Today's Date: 01/30/2012 Time: 5621-3086 Time Calculation (min): 55 min  Short Term Goals: Week 1:  PT Short Term Goal 1 (Week 1): Pt will transfer with min A bed <-> chair PT Short Term Goal 2 (Week 1): Pt will propel w/c x 150' on unit  with S PT Short Term Goal 3 (Week 1): Pt will be able to manage w/c parts and slideboard for transfers with S  Skilled Therapeutic Interventions/Progress Updates:    Focused on w/c mobility, w/c parts management to set up for transfers, slide board transfers w/c <-> mat level surface, and functional strengthening of core to aid with mobility. Pt with poor attention to task; easily distracted and difficulty with return demonstration of activities. Core strengthening using weighted medicine ball in PNF diagonals and rotation, but pt difficulty staying on task. Scooting edge of mat with focus on lifting into squat to aid with transfers using slideboard or squat pivot technique. Pt continues to require reminders that gait will not be primary focus here on rehab due to WB status and need for R foot to heel.  Pt also states he doesn't feel he needs to be here until 1/31 but quick to take on dependent role with w/c set up, transfers, and overall mobility asking for assist for w/c propulsion or placing board, etc. Reminded pt that goals are set at a level where he is doing the mobility piece and his wife is just there for safety and assist if needed. Cognitive issues limit pt's carryover and full understanding of goals here.  Therapy Documentation Precautions:  Precautions Precautions: Fall Precaution Comments: L prosthesis new for pt; R foot WB through heel only with Darco shoe Required Braces or Orthoses: Other Brace/Splint Other Brace/Splint: Post-up shoe for right foot Restrictions Weight Bearing Restrictions: Yes Other Position/Activity  Restrictions: WB through R heel only; Darco shoe on RLE    Pain:  Did not rate pain or identify source.  See FIM for current functional status  Therapy/Group: Individual Therapy  [name] [facility] 01/30/2012, 10:38 AM

## 2012-01-30 NOTE — Patient Care Conference (Signed)
Inpatient RehabilitationTeam Conference and Plan of Care Update Date: 01/29/2012   Time: 2:20 PM    Patient Name: Alan [name]      Medical Record Number: [id number]  Date of Birth: 15-Jan-1931 Sex: Male         Room/Bed: 4007/4007-01 Payor Info: Payor: MEDICARE RAILROAD  Plan: MEDICARE RAILROAD  Product Type: *No Product type*     Admitting Diagnosis: TOE AMPUTATION HX L BKA  Admit Date/Time:  01/28/2012  2:50 PM Admission Comments: No comment available   Primary Diagnosis:  Hx of BKA Principal Problem: Hx of BKA  Patient Active Problem List   Diagnosis Date Noted  . Hx of BKA 01/28/2012  . Pre-operative cardiovascular examination 01/24/2012  . SIRS (systemic inflammatory response syndrome) 01/22/2012  . Wound infection 01/22/2012  . Unilateral complete BKA 01/22/2012  . Atrial fibrillation 01/22/2012  . Aftercare following surgery of the circulatory system, NEC 11/26/2011  . Atherosclerosis of native arteries of the extremities with ulceration(440.23) 06/18/2011  . Diabetes mellitus 05/04/2011  . Chronic anticoagulation 05/04/2011  . Hypotension 05/04/2011  . Sepsis 05/03/2011  . Toxic metabolic encephalopathy 05/03/2011  . Aortic stenosis 11/22/2010  . CAD (coronary artery disease) 11/22/2010  . End stage renal disease on dialysis 11/22/2010  . Encounter for long-term (current) use of anticoagulants 11/16/2010    Expected Discharge Date: Expected Discharge Date: 02/08/12  Team Members Present: Physician leading conference: Dr. Faith Rogue Social Worker Present: Amada Jupiter, LCSW Nurse Present: Daryll Brod, RN PT Present: [name], PT;Other (comment) Corinna Capra., PT) OT Present: Edwin Cap, OT;Pleas Patricia, OT Other (Discipline and Name): Tora Duck, PPS Coordinator     Current Status/Progress Goal Weekly Team Focus  Medical   right right 2nd toe amp, prio left bka  pain mgt, transfer training, manage wb precautions  renal, pain, wound care,  pros ed   Bowel/Bladder   Continent of bowel. LBM 01/27/12. HD-T, Th, Sat  Pt to remain continent of bowel  Monitor   Swallow/Nutrition/ Hydration             ADL's   min-total assist overall  overall supervision, no shower transfer goal  ADL retraining, bed mobility, ADL transfers, overall activity tolerance/endurance   Mobility   mod A overall   S overall w/c level; min A car transfer  transfer training, functional strengthening, education, endurance/activity tolerance   Communication             Safety/Cognition/ Behavioral Observations            Pain   No c/o pain  <3  Offer pain medication 1hr prior to initial therapy   Skin   L BKA with heal incision. Skin tear to L knee with tegaderm intact. HD graft RUE, Diabetic ulcer to R ankle w/d dressing, R second toe amputated. W/D tid  No additional skin breakdown  Routine turn q 2hrs, Assess and change wound dressing per order    Rehab Goals Patient on target to meet rehab goals: Yes *See Interdisciplinary Assessment and Plan and progress notes for long and short-term goals  Barriers to Discharge: size, weakness, lack of prosthetic knowledge, cognition, weight bearing precautions    Possible Resolutions to Barriers:  family ed, darco shoe to help weight bearing, pros ed    Discharge Planning/Teaching Needs:  home with wife and step-son to provide 24/7 assistance as they did PTA      Team Discussion:  Have clarified with vascular that pt to be PWB on right -  to change to [employer]. Therapy supervisor has addressed wife's concerns about prosthesis and staff knowledge about.  Wife will need ongoing education about prosthesis as well.  Focus for CIR to be on improvement of transfers and standing.     Revisions to Treatment Plan:  No changes at this time.   Continued Need for Acute Rehabilitation Level of Care: The patient requires daily medical management by a physician with specialized training in physical medicine and  rehabilitation for the following conditions: Daily direction of a multidisciplinary physical rehabilitation program to ensure safe treatment while eliciting the highest outcome that is of practical value to the patient.: Yes Daily medical management of patient stability for increased activity during participation in an intensive rehabilitation regime.: Yes Daily analysis of laboratory values and/or radiology reports with any subsequent need for medication adjustment of medical intervention for : Neurological problems;Post surgical problems (prosthetic issues, esrd, wound care, pain)  Diago Haik 01/30/2012, 10:57 AM

## 2012-01-30 NOTE — Progress Notes (Signed)
Subjective/Complaints: Disoriented this am. Doesn'[name] know where he is or why he's here. A little irritable.  A 12 point review of systems has been performed and if not noted above is otherwise negative.   Objective: Vital Signs: Blood pressure 112/63, pulse 80, temperature 98.8 F (37.1 C), temperature source Oral, resp. rate 17, weight 86.2 kg (190 lb 0.6 oz), SpO2 97.00%. No results found.  Basename 01/29/12 1628 01/28/12 0822  WBC 14.2* 13.7*  HGB 9.3* 10.0*  HCT 28.8* 31.3*  PLT 308 310    Basename 01/29/12 1628  NA 129*  K 4.4  CL 88*  GLUCOSE 176*  BUN 59*  CREATININE 10.30*  CALCIUM 8.9   CBG (last 3)   Basename 01/30/12 0718 01/29/12 2105 01/29/12 1110  GLUCAP 157* 118* 156*    Wt Readings from Last 3 Encounters:  01/30/12 86.2 kg (190 lb 0.6 oz)  01/27/12 103.692 kg (228 lb 9.6 oz)  01/27/12 103.692 kg (228 lb 9.6 oz)    Physical Exam:  HENT: mucosa pink  Head: Normocephalic.  Eyes:  Pupils round and reactive to light  Neck: Neck supple. No thyromegaly present.  Cardiovascular:  Cardiac rate controlled, with systolic murmur, no rub.  Pulmonary/Chest: Effort normal and breath sounds normal. No respiratory distress. No wheezes  Abdominal: Soft. Bowel sounds are normal. Non-distended Neurological: confused. Oriented to name only. Able to follow simple commands. UE's 4/5. LLE 3/5 HF, RLE grossly 2+ to 3/5 with some limitations due to pain. Skin:  Left BKA is well healed. dry dressing in place over right 2nd toe with minimal serosanginous dc. Mild ulceration over lateral patella (left) Psychiatric:  Very flat, sometimes irritable   Assessment/Plan: 1. Functional deficits secondary to right second toe amputation with a history of left BKA (pt just fitted with a prosthesis) which require 3+ hours per day of interdisciplinary therapy in a comprehensive inpatient rehab setting. Physiatrist is providing close team supervision and 24 hour management of active  medical problems listed below. Physiatrist and rehab team continue to assess barriers to discharge/monitor patient progress toward functional and medical goals. FIM: FIM - Bathing Bathing: 0: Activity did not occur  FIM - Upper Body Dressing/Undressing Upper body dressing/undressing steps patient completed: Thread/unthread right sleeve of pullover shirt/dresss;Thread/unthread left sleeve of pullover shirt/dress;Pull shirt over trunk Upper body dressing/undressing: 4: Min-Patient completed 75 plus % of tasks FIM - Lower Body Dressing/Undressing Lower body dressing/undressing: 1: Total-Patient completed less than 25% of tasks  FIM - Toileting Toileting: 0: Activity did not occur  FIM - Archivist Transfers: 0-Activity did not occur  FIM - Banker Devices: Arm rests;Bed rails Bed/Chair Transfer: 2: Chair or W/C > Bed: Max A (lift and lower assist)  FIM - Locomotion: Wheelchair Locomotion: Wheelchair: 2: Travels 50 - 149 ft with minimal assistance (Pt.>75%) FIM - Locomotion: Ambulation Locomotion: Ambulation: 0: Activity did not occur (unable to assess)  Comprehension Comprehension Mode: Auditory Comprehension: 5-Understands basic 90% of the time/requires cueing < 10% of the time  Expression Expression Mode: Verbal Expression: 5-Expresses basic needs/ideas: With extra time/assistive device  Social Interaction Social Interaction: 5-Interacts appropriately 90% of the time - Needs monitoring or encouragement for participation or interaction.  Problem Solving Problem Solving: 5-Solves complex 90% of the time/cues < 10% of the time  Memory Memory: 5-Recognizes or recalls 90% of the time/requires cueing < 10% of the time  Medical Problem List and Plan:  1. 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 Darco shoe ordered for heel weight bearing RLE  -team  working on don/doffing of prosthesis, will uses prosthesis for transfers only during this stay.  2. DVT Prophylaxis/Anticoagulation: Chronic Coumadin therapy for atrial fibrillation. Continue heparin therapy until INR therapeutic. Monitor for any bleeding episodes  3. Pain Management: Percocet dose adjusted down 4. Neuropsych: This patient is not capable of making decisions on his/her own behalf.  5. End stage renal disease/hemodialysis. Followup per renal services.    6. Diabetes mellitus with peripheral neuropathy. Latest hemoglobin A1c of 6.3. Continue sliding scale insulin and check blood sugars a.c. and at bedtime. Patient on Amaryll 4 mg daily prior to admission. Resume oral agents as needed  -right now cbg's remain reasonable 7. Chronic anemia. Aranesp as directed as well as ferric gluconate. Latest hemoglobin 10.0. Followup labs with dialysis  8. Gout. Zyloprim. Monitor for any signs of flareup  9. Hypertension/atrial fibrillation. Lopressor 12.5 mg twice a day. Monitor with increased activity  10. Hyperlipidemia. Zocor  11. Wound care: continue dry dressing right 2nd toe 12. Leukocytosis: wbc's remain around 14k 13. Confusion: has waxed and waned  -limit neurosedating meds such as percocet. (dose/schedule reduced)  -I suspect mild baseline dementia as well.  LOS (Days) 2 A FACE TO FACE EVALUATION WAS PERFORMED  Alan [name] 01/30/2012 8:00 AM

## 2012-01-30 NOTE — Progress Notes (Signed)
Social Work Patient ID: Alan [name], male   DOB: 23-Jun-1931, 78 y.o.   MRN: [id number]  Met with patient this morning to review team conference - pt informed of  his targeted d/c date set for 02/08/12, however, difficult to tell if he fully understood as he simply smiled and then made a statement about having to stop HD due to stump pain.   Wife not here today.  Hope to see her tomorrow and will review team conference again with both of them.  Alecxis Baltzell, LCSW

## 2012-01-31 ENCOUNTER — Inpatient Hospital Stay (HOSPITAL_COMMUNITY): Payer: MEDICARE

## 2012-01-31 ENCOUNTER — Inpatient Hospital Stay (HOSPITAL_COMMUNITY): Payer: MEDICARE | Admitting: Occupational Therapy

## 2012-01-31 LAB — CBC
HCT: 28.2 % — ABNORMAL LOW (ref 39.0–52.0)
Hemoglobin: 9.3 g/dL — ABNORMAL LOW (ref 13.0–17.0)
MCH: 29.9 pg (ref 26.0–34.0)
MCHC: 33 g/dL (ref 30.0–36.0)
RBC: 3.11 MIL/uL — ABNORMAL LOW (ref 4.22–5.81)

## 2012-01-31 LAB — RENAL FUNCTION PANEL
BUN: 43 mg/dL — ABNORMAL HIGH (ref 6–23)
CO2: 26 mEq/L (ref 19–32)
Calcium: 8.7 mg/dL (ref 8.4–10.5)
Chloride: 90 mEq/L — ABNORMAL LOW (ref 96–112)
Creatinine, Ser: 8.83 mg/dL — ABNORMAL HIGH (ref 0.50–1.35)
GFR calc non Af Amer: 5 mL/min — ABNORMAL LOW (ref >90)
Glucose, Bld: 196 mg/dL — ABNORMAL HIGH (ref 70–99)

## 2012-01-31 LAB — GLUCOSE, CAPILLARY
Glucose-Capillary: 113 mg/dL — ABNORMAL HIGH (ref 70–99)
Glucose-Capillary: 201 mg/dL — ABNORMAL HIGH (ref 70–99)
Glucose-Capillary: 146 mg/dL — ABNORMAL HIGH (ref 70–99)

## 2012-01-31 MED ORDER — WARFARIN SODIUM 3 MG PO TABS
3.0000 mg | ORAL_TABLET | Freq: Once | ORAL | Status: DC
  Filled 2012-01-31: qty 1

## 2012-01-31 NOTE — Progress Notes (Signed)
Physical Therapy Session Note  Patient Details  Name: KANI CHAUVIN MRN: [id number] Date of Birth: [birth year]  Today's Date: 01/31/2012 Time: 1000-1056 Time Calculation (min): 56 min  Short Term Goals: Week 1:  PT Short Term Goal 1 (Week 1): Pt will transfer with min A bed <-> chair PT Short Term Goal 2 (Week 1): Pt will propel w/c x 150' on unit  with S PT Short Term Goal 3 (Week 1): Pt will be able to manage w/c parts and slideboard for transfers with S  Skilled Therapeutic Interventions/Progress Updates:    w/c propulsion to and from therapy gym for endurance, strengthening, and functional mobility training. Focused on w/c parts management and set up for transfers requiring mod cues for appropriate technique and S for transfer over with slideboard to mat (level surface). LE strengthening in supine including heel slides, hip abduction, SAQ, bridging (1 set of 10), LAQ, resisted knee flexion with green theraband x 10 reps x 3 sets bilaterally. Pt required reminder as to why gait is not a primary goal at this time due to WB status - decreased memory noted during session.   Therapy Documentation Precautions:  Precautions Precautions: Fall Precaution Comments: L prosthesis new for pt; R foot WB through heel only with Darco shoe Required Braces or Orthoses: Other Brace/Splint Other Brace/Splint: Post-up shoe for right foot Restrictions Weight Bearing Restrictions: Yes Other Position/Activity Restrictions: WB through R heel only; Darco shoe on RLE   Pain: No complaints   See FIM for current functional status  Therapy/Group: Individual Therapy  [name] [facility] 01/31/2012, 10:57 AM

## 2012-01-31 NOTE — Progress Notes (Signed)
Social Work Patient ID: Alan [name], male   DOB: Feb 07, 1931, 79 y.o.   MRN: [id number]  Contacted wife at home this morning - she is aware and agreeable with targeted d/c date 02/08/12.  Understands primary focus with therapies to be on transfer training with plan to begin outpatient rehab for prosthetic training after CIR d/c.  Continue to follow.  Venezia Sargeant, LCSW

## 2012-01-31 NOTE — Progress Notes (Signed)
Occupational Therapy Session Note  Patient Details  Name: Alan [name] MRN: [id number] Date of Birth: 1931-01-11  Today's Date: 01/31/2012 Time: 4098-1191 Time Calculation (min): 37 min  Short Term Goals: Week 1:  OT Short Term Goal 1 (Week 1): Patient will perform LB dressing with minimal assistance at bed level OT Short Term Goal 2 (Week 1): Patient will perform UB/LB bathing with minimal assistance at bed level OT Short Term Goal 3 (Week 1): Patient will perform toilet transfer with moderate assistance OT Short Term Goal 4 (Week 1): Patient will be able to donn prosthetic with mod verbal cues  Skilled Therapeutic Interventions/Progress Updates:    Patient seen this pm for 1:1 OT session to address functional mobility.  Patient very anxious regarding dialysis schedule, and concerned that he would be sent very late this afternoon or evening.  Discussed with nurse, and nurse secretary these concerns, and nurse secretary called to indicate patient ready for HD.   Practiced squat pivot transfer level surface without slide board, with set up and close supervision, multiple scoots.  Practiced wheelchair to bed transfer with slide board as bed much higher surface.  Patient needed assistance for board placement, although able to provide feeback regarding accuracy of placement.  Patient again transferred with close supervision.  Practiced bridging in bed and moving hips to left for comfort.  Patient able to verbally direct removal of prosthesis.    Therapy Documentation Precautions:  Precautions Precautions: Fall Precaution Comments: L prosthesis new for pt; R foot WB through heel only with Darco shoe Required Braces or Orthoses: Other Brace/Splint Other Brace/Splint: Post-up shoe for right foot Restrictions Weight Bearing Restrictions: Yes Other Position/Activity Restrictions: WB through R heel only; Darco shoe on RLE  Pain:  Patient with report of back pain when transitioning from sit  to supine.   Patient concerned that back would be hurting while in dialysis.  Called RN for pain medicine.   See FIM for current functional status  Therapy/Group: Individual Therapy  Collier Salina 01/31/2012, 4:11 PM

## 2012-01-31 NOTE — Progress Notes (Signed)
1845 Report given to Luster Landsberg, RN in dialysis.  Will notify RN when ready for transfer patient.

## 2012-01-31 NOTE — Progress Notes (Signed)
Occupational Therapy Session Notes  Patient Details  Name: Alan [name] MRN: [id number] Date of Birth: January 14, 1931  Today's Date: 01/31/2012  Short Term Goals: Week 1:  OT Short Term Goal 1 (Week 1): Patient will perform LB dressing with minimal assistance at bed level OT Short Term Goal 2 (Week 1): Patient will perform UB/LB bathing with minimal assistance at bed level OT Short Term Goal 3 (Week 1): Patient will perform toilet transfer with moderate assistance OT Short Term Goal 4 (Week 1): Patient will be able to donn prosthetic with mod verbal cues  Skilled Therapeutic Interventions/Progress Updates:   Session #1 [phone] - 55 Minutes Individual Therapy No complaints of pain Patient found seated edge of bed. Engaged in ADL retraining at edge of bed, patient performing lateral leans for peri care and clothing management. Focused skilled intervention on UB/LB bathing & dressing, lateral leans, overall activity tolerance/endurance, donning of prosthetic, and edge of bed -> w/c scoot pivot transfer with minimal assistance. Patient takes more than reasonable amount of time to complete tasks. Patient also with inappropriate conversation topic during session, therapist would tell patient it was inappropriate and change subject. At end of session left patient seated in w/c beside bed with bilateral leg rests donned and call bell & phone within reach.   Session #2 1400-1430 - 30 Minutes Individual Therapy No complaints of pain Patient found seated in w/c with complaints of having to participate in therapy and requesting to go to dialysis instantly. Therapist tried explaining therapy and that patient would go to dialysis after therapy. Patient continued to perseverate on dialysis. Therapist then encouraged patient to propel w/c down hallway, patient continuously refused. Therapist then propelled patient from room -> therapy gym for slide board transfer onto mat. Also focused on scooting left  <-> right and theraband exercises. Left patient seated on mat for next therapy session.   Precautions:  Precautions Precautions: Fall Precaution Comments: L prosthesis new for pt; R foot WB through heel only with Darco shoe Required Braces or Orthoses: Other Brace/Splint Other Brace/Splint: Post-up shoe for right foot Restrictions Weight Bearing Restrictions: Yes Other Position/Activity Restrictions: WB through R heel only; Darco shoe on RLE  See FIM for current functional status  [name] 01/31/2012, 8:01 AM

## 2012-01-31 NOTE — Progress Notes (Signed)
Subjective:  Feels all right Up eating bfast Worried about his foot  Objective:     Vital signs in last 24 hours: Filed Vitals:   01/30/12 0529 01/30/12 1554 01/30/12 2124 01/31/12 0434  BP: 112/63 103/57 121/55 96/42  Pulse: 80 61 82 88  Temp: 98.8 F (37.1 C) 98.4 F (36.9 C)  98.4 F (36.9 C)  TempSrc: Oral Oral  Oral  Resp: 17 16  18   Weight: 86.2 kg (190 lb 0.6 oz)     SpO2: 97% 100%  100%   Weight change:   Intake/Output Summary (Last 24 hours) at 01/31/12 0806 Last data filed at 01/30/12 1729  Gross per 24 hour  Intake    360 ml  Output      0 ml  Net    360 ml    Physical Exam:  Blood pressure 96/42, pulse 88, temperature 98.4 F (36.9 C), temperature source Oral, resp. rate 18, weight 86.2 kg (190 lb 0.6 oz), SpO2 100.00%. Lungs clear  S1S2 no S3  2/6 murmur heard across precordium No diastolic murmur  Soft abdomen Non tender  Left BK stump  Right foot dressed  Trace RLE edema  Right AVF good bruit   Labs: Labs to be done pre hd   Lab 01/29/12 1628 01/26/12 0733 01/25/12 0445  NA 129* 133* 137  K 4.4 4.1 4.0  CL 88* 92* 95*  CO2 23 26 29   GLUCOSE 176* 125* 117*  BUN 59* 50* 31*  CREATININE 10.30* 8.22* 6.02*  ALB -- -- --  CALCIUM 8.9 8.7 8.9  PHOS 4.2 4.0 --     Lab 01/29/12 1628 01/26/12 0733  AST -- --  ALT -- --  ALKPHOS -- --  BILITOT -- --  PROT -- --  ALBUMIN 2.6* 2.4*   No results found for this basename: LIPASE:3,AMYLASE:3 in the last 168 hours No results found for this basename: AMMONIA:3 in the last 168 hours   Lab 01/29/12 1628 01/28/12 0822 01/27/12 0630 01/26/12 0232  WBC 14.2* 13.7* 14.1* 10.7*  NEUTROABS -- -- -- --  HGB 9.3* 10.0* 10.9* 8.9*  HCT 28.8* 31.3* 34.1* 27.1*  MCV 89.4 90.7 91.7 91.6  PLT 308 310 310 234     Lab 01/31/12 0714 01/30/12 2134 01/30/12 1644 01/30/12 1137 01/30/12 0718  GLUCAP 113* 145* 145* 186* 157*   Studies/Results: No results found.       [name] allopurinol  150 mg Oral Daily    . aspirin EC  81 mg Oral Daily  . calcium carbonate  1 tablet Oral TID WC  . darbepoetin (ARANESP) injection - DIALYSIS  150 mcg Intravenous Q Tue-HD  . doxercalciferol  4 mcg Intravenous Q T,Th,Sa-HD  . ferric gluconate (FERRLECIT/NULECIT) IV  125 mg Intravenous Q Thu-HD  . insulin aspart  0-9 Units Subcutaneous TID WC  . metoprolol tartrate  12.5 mg Oral BID  . multivitamin  1 tablet Oral QHS  . simvastatin  40 mg Oral QHS  . Warfarin - Pharmacist Dosing Inpatient   Does not apply q1800     I  have reviewed scheduled and prn medications. (outpt Dialysis Orders: Center: [facility] on TTS .  EDW 106 kg HD Bath 2.0 k, 2.25 ca Time 4 hrs 15 min Heparin 3,400units. Access Right upper arm avf BFR 500 DFR 800 Hectoral 4 mcg IV/HD Epogen 1000 on tue and sat IV/HD Venofer 100mg  q weekly hd  Other 0 )  ASSESSMENT/RECOMMENDATIONS  1. Gangrene right foot  s/p toe amp 1/17  VVS feels viable but high risk for limb loss with "50/50 chance of healing"  They will follow   2. ESRD  Cont TTS HD  HD later today after therapies  3. HTN/volume  Metoprolol plus HD   4. Anemia  Weekly FE  Aranesp 150   5. AFib  Coumadin per pharmacy   6. CAD/CABG 1996/Mod AS  Not surgical candidate   7. DMII  Per primary   8. CKD-MBD  Binders  ?not on usual Hectorol - restart (with 1/23 HD)    Camille Bal, MD [facility] [phone] Pager 01/31/2012, 8:06 AM

## 2012-01-31 NOTE — Progress Notes (Signed)
ANTICOAGULATION CONSULT NOTE - Follow Up Consult  Pharmacy Consult for coumadin Indication: atrial fibrillation  Allergies  Allergen Reactions  . Ancef (Cefazolin) Rash    Patient Measurements: Weight: 190 lb 0.6 oz (86.2 kg)  Vital Signs: Temp: 98.4 F (36.9 C) (01/23 0434) Temp src: Oral (01/23 0434) BP: 96/42 mmHg (01/23 0434) Pulse Rate: 88  (01/23 0434)  Labs:  Alvira Philips 01/31/12 1610 01/30/12 0650 01/29/12 1628 01/29/12 0555 01/28/12 1530  HGB -- -- 9.3* -- --  HCT -- -- 28.8* -- --  PLT -- -- 308 -- --  APTT -- -- -- -- --  LABPROT 22.9* 26.7* -- 28.8* --  INR 2.13* 2.62* -- 2.90* --  HEPARINUNFRC -- -- -- -- <0.10*  CREATININE -- -- 10.30* -- --  CKTOTAL -- -- -- -- --  CKMB -- -- -- -- --  TROPONINI -- -- -- -- --    The CrCl is unknown because both a height and weight (above a minimum accepted value) are required for this calculation.   Medications:  Scheduled:     . allopurinol  150 mg Oral Daily  . aspirin EC  81 mg Oral Daily  . calcium carbonate  1 tablet Oral TID WC  . darbepoetin (ARANESP) injection - DIALYSIS  150 mcg Intravenous Q Tue-HD  . doxercalciferol  4 mcg Intravenous Q T,Th,Sa-HD  . ferric gluconate (FERRLECIT/NULECIT) IV  125 mg Intravenous Q Thu-HD  . insulin aspart  0-9 Units Subcutaneous TID WC  . metoprolol tartrate  12.5 mg Oral BID  . multivitamin  1 tablet Oral QHS  . simvastatin  40 mg Oral QHS  . [COMPLETED] warfarin  2 mg Oral ONCE-1800  . Warfarin - Pharmacist Dosing Inpatient   Does not apply q1800   Infusions:    Assessment: 79 yo male with history of afib is currently on therapeutic coumadin. Prior to admission coumadin dose was 5mg  daily except 2.5mg  on Fridays, but admitting INR was high. INR decreased to 2.13 from 2.62. No bleeding noted per chart.  Goal of Therapy:  INR 2-3    Plan:  1) Coumadin 3mg  po x1 2) INR in am  Bayard Hugger, PharmD, BCPS  Clinical Pharmacist  Pager: [phone]  01/31/2012,1:18  PM

## 2012-01-31 NOTE — Progress Notes (Signed)
Subjective/Complaints: Alert. Eating breakfast. No complaints  A 12 point review of systems has been performed and if not noted above is otherwise negative.   Objective: Vital Signs: Blood pressure 96/42, pulse 88, temperature 98.4 F (36.9 C), temperature source Oral, resp. rate 18, weight 86.2 kg (190 lb 0.6 oz), SpO2 100.00%. No results found.  Basename 01/29/12 1628 01/28/12 0822  WBC 14.2* 13.7*  HGB 9.3* 10.0*  HCT 28.8* 31.3*  PLT 308 310    Basename 01/29/12 1628  NA 129*  K 4.4  CL 88*  GLUCOSE 176*  BUN 59*  CREATININE 10.30*  CALCIUM 8.9   CBG (last 3)   Basename 01/31/12 0714 01/30/12 2134 01/30/12 1644  GLUCAP 113* 145* 145*    Wt Readings from Last 3 Encounters:  01/30/12 86.2 kg (190 lb 0.6 oz)  01/27/12 103.692 kg (228 lb 9.6 oz)  01/27/12 103.692 kg (228 lb 9.6 oz)    Physical Exam:  HENT: mucosa pink  Head: Normocephalic.  Eyes:  Pupils round and reactive to light  Neck: Neck supple. No thyromegaly present.  Cardiovascular:  Cardiac rate controlled, with systolic murmur, no rub.  Pulmonary/Chest: Effort normal and breath sounds normal. No respiratory distress. No wheezes  Abdominal: Soft. Bowel sounds are normal. Non-distended Neurological:oriented today. Good sitting balance Able to follow simple commands. UE's 4/5. LLE 3/5 HF, RLE grossly 2+ to 3/5 with some limitations due to pain.  Skin:  Left BKA is well healed. dry dressing in place over right 2nd toe with minimal serosanginous dc, margins a little dark. Mild ulceration over lateral patella (left) resolving. Psychiatric:  Very flat,     Assessment/Plan: 1. Functional deficits secondary to right second toe amputation with a history of left BKA (pt just fitted with a prosthesis) which require 3+ hours per day of interdisciplinary therapy in a comprehensive inpatient rehab setting. Physiatrist is providing close team supervision and 24 hour management of active medical problems listed  below. Physiatrist and rehab team continue to assess barriers to discharge/monitor patient progress toward functional and medical goals. FIM: FIM - Bathing Bathing Steps Patient Completed: Chest;Right Arm;Left Arm;Abdomen;Right upper leg;Left upper leg Bathing: 4: Min-Patient completes 8-9 61f 10 parts or 75+ percent  FIM - Upper Body Dressing/Undressing Upper body dressing/undressing steps patient completed: Thread/unthread right sleeve of pullover shirt/dresss;Thread/unthread left sleeve of pullover shirt/dress;Put head through opening of pull over shirt/dress;Pull shirt over trunk Upper body dressing/undressing: 4: Steadying assist FIM - Lower Body Dressing/Undressing Lower body dressing/undressing: 1: Total-Patient completed less than 25% of tasks  FIM - Toileting Toileting: 1: Total-Patient completed zero steps, helper did all 3  FIM - Diplomatic Services operational officer Devices: Bedside commode;Grab bars Toilet Transfers: 2-To toilet/BSC: Max A (lift and lower assist);2-From toilet/BSC: Max A (lift and lower assist)  FIM - Bed/Chair Transfer Bed/Chair Transfer Assistive Devices: Sliding board;HOB elevated;Bed rails Bed/Chair Transfer: 4: Supine > Sit: Min A (steadying Pt. > 75%/lift 1 leg);3: Bed > Chair or W/C: Mod A (lift or lower assist)  FIM - Locomotion: Wheelchair Locomotion: Wheelchair: 2: Travels 50 - 149 ft with supervision, cueing or coaxing FIM - Locomotion: Ambulation Locomotion: Ambulation: 0: Activity did not occur  Comprehension Comprehension Mode: Auditory Comprehension: 5-Understands basic 90% of the time/requires cueing < 10% of the time  Expression Expression Mode: Verbal Expression: 5-Expresses complex 90% of the time/cues < 10% of the time  Social Interaction Social Interaction: 5-Interacts appropriately 90% of the time - Needs monitoring or encouragement for participation or interaction.  Problem Solving Problem Solving: 5-Solves complex 90%  of the time/cues < 10% of the time  Memory Memory: 5-Recognizes or recalls 90% of the time/requires cueing < 10% of the time  Medical Problem List and Plan:  1. Right second toe amputation 01/25/2012 secondary to gangrenous changes/history of left BKA and recent receive prosthesis. Darco shoe ordered for heel weight bearing RLE  -team working on don/doffing of prosthesis, will uses prosthesis for transfers only during this stay.  2. DVT Prophylaxis/Anticoagulation: Chronic Coumadin therapy for atrial fibrillation. Continue heparin therapy until INR therapeutic. Monitor for any bleeding episodes  3. Pain Management: Percocet dose adjusted down 4. Neuropsych: This patient is not capable of making decisions on his/her own behalf.  5. End stage renal disease/hemodialysis. Followup per renal services.    6. Diabetes mellitus with peripheral neuropathy. Latest hemoglobin A1c of 6.3. Continue sliding scale insulin and check blood sugars a.c. and at bedtime. Patient on Amaryll 4 mg daily prior to admission. Resume oral agents as needed  -right now cbg's remain reasonable 7. Chronic anemia. Aranesp as directed as well as ferric gluconate. Latest hemoglobin 10.0. Followup labs with dialysis  8. Gout. Zyloprim. Monitor for any signs of flareup  9. Hypertension/atrial fibrillation. Lopressor 12.5 mg twice a day. Monitor with increased activity  10. Hyperlipidemia. Zocor  11. Wound care: continue dry dressing right 2nd toe 12. Leukocytosis: wbc's remain around 14k 13. Confusion: has waxed and waned  -limiting neurosedating meds such as percocet. (dose/schedule reduced)  -I suspect mild baseline dementia as well.  LOS (Days) 3 A FACE TO FACE EVALUATION WAS PERFORMED  SWARTZ,ZACHARY T 01/31/2012 7:54 AM

## 2012-02-01 ENCOUNTER — Inpatient Hospital Stay (HOSPITAL_COMMUNITY): Payer: MEDICARE | Admitting: *Deleted

## 2012-02-01 ENCOUNTER — Inpatient Hospital Stay (HOSPITAL_COMMUNITY): Payer: MEDICARE | Admitting: Physical Therapy

## 2012-02-01 ENCOUNTER — Inpatient Hospital Stay (HOSPITAL_COMMUNITY): Payer: MEDICARE

## 2012-02-01 DIAGNOSIS — S88119A Complete traumatic amputation at level between knee and ankle, unspecified lower leg, initial encounter: Secondary | ICD-10-CM

## 2012-02-01 DIAGNOSIS — I70269 Atherosclerosis of native arteries of extremities with gangrene, unspecified extremity: Secondary | ICD-10-CM

## 2012-02-01 DIAGNOSIS — N186 End stage renal disease: Secondary | ICD-10-CM

## 2012-02-01 DIAGNOSIS — S98139A Complete traumatic amputation of one unspecified lesser toe, initial encounter: Secondary | ICD-10-CM

## 2012-02-01 LAB — GLUCOSE, CAPILLARY
Glucose-Capillary: 120 mg/dL — ABNORMAL HIGH (ref 70–99)
Glucose-Capillary: 132 mg/dL — ABNORMAL HIGH (ref 70–99)
Glucose-Capillary: 167 mg/dL — ABNORMAL HIGH (ref 70–99)
Glucose-Capillary: 176 mg/dL — ABNORMAL HIGH (ref 70–99)

## 2012-02-01 MED ORDER — GLIMEPIRIDE 2 MG PO TABS
2.0000 mg | ORAL_TABLET | Freq: Every day | ORAL | Status: DC
  Administered 2012-02-01 – 2012-02-03 (×3): 2 mg via ORAL
  Filled 2012-02-01 (×5): qty 1

## 2012-02-01 MED ORDER — WARFARIN SODIUM 5 MG PO TABS
5.0000 mg | ORAL_TABLET | Freq: Once | ORAL | Status: AC
  Administered 2012-02-01: 5 mg via ORAL
  Filled 2012-02-01: qty 1

## 2012-02-01 MED ORDER — PENTAFLUOROPROP-TETRAFLUOROETH EX AERO
INHALATION_SPRAY | CUTANEOUS | Status: AC
  Filled 2012-02-01: qty 103.5

## 2012-02-01 NOTE — Progress Notes (Signed)
ANTICOAGULATION CONSULT NOTE - Follow Up Consult  Pharmacy Consult for coumadin Indication: atrial fibrillation  Allergies  Allergen Reactions  . Ancef (Cefazolin) Rash    Patient Measurements: Weight: 264 lb 15.9 oz (120.2 kg)  Vital Signs: Temp: 98.1 F (36.7 C) (01/24 0529) Temp src: Oral (01/24 0529) BP: 107/64 mmHg (01/24 0529) Pulse Rate: 100  (01/24 0529)  Labs:  Alan [name] 02/01/12 0640 01/31/12 2001 01/31/12 1958 01/31/12 0635 01/30/12 0650 01/29/12 1628  HGB -- -- 9.3* -- -- 9.3*  HCT -- -- 28.2* -- -- 28.8*  PLT -- -- 349 -- -- 308  APTT -- -- -- -- -- --  LABPROT 20.8* -- -- 22.9* 26.7* --  INR 1.87* -- -- 2.13* 2.62* --  HEPARINUNFRC -- -- -- -- -- --  CREATININE -- 8.83* -- -- -- 10.30*  CKTOTAL -- -- -- -- -- --  CKMB -- -- -- -- -- --  TROPONINI -- -- -- -- -- --    The CrCl is unknown because both a height and weight (above a minimum accepted value) are required for this calculation.   Medications:  Scheduled:     . allopurinol  150 mg Oral Daily  . aspirin EC  81 mg Oral Daily  . calcium carbonate  1 tablet Oral TID WC  . darbepoetin (ARANESP) injection - DIALYSIS  150 mcg Intravenous Q Tue-HD  . doxercalciferol  4 mcg Intravenous Q T,Th,Sa-HD  . ferric gluconate (FERRLECIT/NULECIT) IV  125 mg Intravenous Q Thu-HD  . glimepiride  2 mg Oral Q breakfast  . insulin aspart  0-9 Units Subcutaneous TID WC  . metoprolol tartrate  12.5 mg Oral BID  . multivitamin  1 tablet Oral QHS  . [EXPIRED] pentafluoroprop-tetrafluoroeth      . simvastatin  40 mg Oral QHS  . warfarin  3 mg Oral ONCE-1800  . Warfarin - Pharmacist Dosing Inpatient   Does not apply q1800   Infusions:    Assessment: 79 yo male with history of afib is currently on therapeutic coumadin. Prior to admission coumadin dose was 5mg  daily except 2.5mg  on Fridays, but admitting INR was high. Yesterday's coumadin dose was not charted, likely missed since patient went to dialysis. INR  decreased to 1.87 this morning. No bleeding noted per chart.  Goal of Therapy:  INR 2-3    Plan:  1) Coumadin 5mg  po x1 2) INR in am  Bayard Hugger, PharmD, BCPS  Clinical Pharmacist  Pager: [phone]  02/01/2012,1:06 PM

## 2012-02-01 NOTE — Progress Notes (Signed)
Physical Therapy Note  Patient Details  Name: JLYNN LANGILLE MRN: [id number] Date of Birth: [birth year] Today's Date: 02/01/2012  Patient missed 60 minutes of skilled physical therapy this AM secondary to patient refusal to participate. 15 minutes spent at beginning of session attempting to arouse patient and have patient engage in therapy session. When blinds opened and lights turned on, patient states "get out of my room, I'm not getting up." RN notified. 25 minutes later, PT returned when RN present in room to attempt therapy again. Attempted to motivate patient to transfer to wheelchair in order to sit up and eat breakfast. HOB elevated and patient continues to maintain eyes closed while PT attempts to engage. PT initiates transfer by assisting with R LE off of bed. Patient opens eyes and grips PT's wrist and states "I'm going to tell you one more time, I'm not getting out of bed today."   Patient left supine in bed with bed alarm on. RN notified.    Zella Richer Martha Ellerby S. Riki Gehring, PT, DPT  02/01/2012, 9:49 AM

## 2012-02-01 NOTE — Progress Notes (Addendum)
Physical Therapy Session Note  Patient Details  Name: Alan [name] MRN: [id number] Date of Birth: 09/28/1931  Today's Date: 02/01/2012 Time:Session #1:  [id number], Session #2: [id number] Time Calculation (min): Session #1: 24 min, Session #2: 42 min   Short Term Goals: Week 1:  PT Short Term Goal 1 (Week 1): Pt will transfer with min A bed <-> chair PT Short Term Goal 2 (Week 1): Pt will propel w/c x 150' on unit  with S PT Short Term Goal 3 (Week 1): Pt will be able to manage w/c parts and slideboard for transfers with S  Skilled Therapeutic Interventions/Progress Updates:    Session #1:  Spent some time early in the session convincing pt to get up OOB.  Explaining to him the medical reasons why he should get up and not "rest" in the bed all day as he has planned.  He reported back pain (did not rate) and I explained that his back might actually feel better up in the [facility] than in that bed with a compliant mattress.  He did finally agree to get up OOB to eat his lunch.  Supine to sit supervision with heavy reliance on the railing to get trunk up to sitting.  Sitting scoot supervision using railing.  Reviewed WB restrictions and reviewed prosthetic application (silicone liner, socks and turn locking mechanism on leg).  Min assist to place slide board and to stabilize it while pt scooted towards his left side into the WC.  Pt positioned with his lunch with call bell and phone in reach.  Session #2: This session focused on WC mobility min assist to help push due to weak upper extremities 150'.  Sit to stand in parallel bars x 2 max assist with max verbal cues once standing to shift weight to left leg and keep weight on heel on right.  Pt at times despite verbal and manual assist to weight shift left would rock back and forth on right [facility] shoe as well as attempting to lift left foot.  I spent the rest break educating pt on why it is important not to put weight trough the right foot.  He was able to  verbalize WB restrictions, but functionally in standing did not abide by them.  So, we switched to seated exercises due to pt's inability to keep R foot PWB through heel only.  Seated exercises: R ankle pumps, bil LAQs, hip flexion, hip adduction against ball, hip abduction with legs straight in sitting all 10 reps each with verbal cues for going slowly and holding each rep for 2-5 seconds.    Therapy Documentation Precautions:  Precautions Precautions: Fall Precaution Comments: L prosthesis new for pt; R foot WB through heel only with Darco shoe Required Braces or Orthoses: Other Brace/Splint Other Brace/Splint: Post-up shoe for right foot (DARCO) Restrictions Weight Bearing Restrictions: Yes RLE Weight Bearing: Partial weight bearing RLE Partial Weight Bearing Percentage or Pounds: PWB through heel only with DARCO shoe Other Position/Activity Restrictions: WB through R heel only; Darco shoe on RLE General: Amount of Missed PT Time (min): 60 Minutes Missed Time Reason: Patient unwilling/refused to participate without medical reason Vital Signs:   Pain: Pain Assessment Pain Assessment: Faces Pain Score:   2 Pain Type: Chronic pain Pain Location: Back Pain Orientation: Lower Pain Intervention(s): Repositioned;Ambulation/increased activity (pt did not rate pain, no grimicing noted with mobility)  See FIM for current functional status  Therapy/Group: Individual Therapy  Alan [name], PT, DPT [phone]   02/01/2012,  12:01 PM

## 2012-02-01 NOTE — Progress Notes (Signed)
Occupational Therapy Note  Patient Details  Name: Alan [name] MRN: [id number] Date of Birth: 1931-04-29 Today's Date: 02/01/2012  Pt missed 60 mins skilled OT services.  Pt resting in bed with eyes closed requiring multiple attempts to arouse patient.  After introducing myself and explaining to patient my purpose patient stated that he was tired and needed to rest. "You can't get any rest around here. I just got back from dialysis at 6:30."  Pt actually returned from dialysis at approx 1 AM.  Continued to encourage patient to sit EOB and eat breakfast and participate in therapy.  Pt repeated multiple times that he needed to rest "A person just can't get any rest around here." RN notified.   Lavone Neri [facility] 02/01/2012, 10:15 AM

## 2012-02-01 NOTE — Progress Notes (Signed)
Subjective/Complaints: A little slow to respond given late HD yesterday  A 12 point review of systems has been performed and if not noted above is otherwise negative.   Objective: Vital Signs: Blood pressure 107/64, pulse 100, temperature 98.1 F (36.7 C), temperature source Oral, resp. rate 18, weight 120.2 kg (264 lb 15.9 oz), SpO2 99.00%. No results found.  Basename 01/31/12 1958 01/29/12 1628  WBC 12.4* 14.2*  HGB 9.3* 9.3*  HCT 28.2* 28.8*  PLT 349 308    Basename 01/31/12 2001 01/29/12 1628  NA 130* 129*  K 4.1 4.4  CL 90* 88*  GLUCOSE 196* 176*  BUN 43* 59*  CREATININE 8.83* 10.30*  CALCIUM 8.7 8.9   CBG (last 3)   Basename 02/01/12 0729 02/01/12 0055 01/31/12 1625  GLUCAP 176* 120* 146*    Wt Readings from Last 3 Encounters:  02/01/12 120.2 kg (264 lb 15.9 oz)  01/27/12 103.692 kg (228 lb 9.6 oz)  01/27/12 103.692 kg (228 lb 9.6 oz)    Physical Exam:  HENT: mucosa pink  Head: Normocephalic.  Eyes:  Pupils round and reactive to light  Neck: Neck supple. No thyromegaly present.  Cardiovascular:  Cardiac rate controlled, with systolic murmur, no rub.  Pulmonary/Chest: Effort normal and breath sounds normal. No respiratory distress. No wheezes  Abdominal: Soft. Bowel sounds are normal. Non-distended Neurological:oriented today. Good sitting balance Able to follow simple commands. UE's 4/5. LLE 3/5 HF, RLE grossly 2+ to 3/5 with some limitations due to pain.  Skin:  Left BKA is well healed. dry dressing in place over right 2nd toe with minimal serosanginous dc, margins a little dark. Mild ulceration over lateral patella (left) resolving. Psychiatric:  Very flat,     Assessment/Plan: 1. Functional deficits secondary to right second toe amputation with a history of left BKA (pt just fitted with a prosthesis) which require 3+ hours per day of interdisciplinary therapy in a comprehensive inpatient rehab setting. Physiatrist is providing close team supervision  and 24 hour management of active medical problems listed below. Physiatrist and rehab team continue to assess barriers to discharge/monitor patient progress toward functional and medical goals. FIM: FIM - Bathing Bathing Steps Patient Completed: Chest;Right Arm;Left Arm;Abdomen;Front perineal area;Right upper leg;Left upper leg Bathing: 4: Min-Patient completes 8-9 39f 10 parts or 75+ percent  FIM - Upper Body Dressing/Undressing Upper body dressing/undressing steps patient completed: Thread/unthread right sleeve of pullover shirt/dresss;Thread/unthread left sleeve of pullover shirt/dress;Put head through opening of pull over shirt/dress;Pull shirt over trunk Upper body dressing/undressing: 5: Supervision: Safety issues/verbal cues FIM - Lower Body Dressing/Undressing Lower body dressing/undressing steps patient completed: Thread/unthread right pants leg;Thread/unthread left pants leg Lower body dressing/undressing: 3: Mod-Patient completed 50-74% of tasks  FIM - Toileting Toileting: 0: Activity did not occur  FIM - Diplomatic Services operational officer Devices: Bedside commode;Grab bars Toilet Transfers: 0-Activity did not occur  FIM - Architectural technologist Transfer: 4: Chair or W/C > Bed: Min A (steadying Pt. > 75%);4: Bed > Chair or W/C: Min A (steadying Pt. > 75%);5: Sit > Supine: Supervision (verbal cues/safety issues);5: Supine > Sit: Supervision (verbal cues/safety issues)  FIM - Locomotion: Wheelchair Locomotion: Wheelchair: 2: Travels 50 - 149 ft with supervision, cueing or coaxing FIM - Locomotion: Ambulation Locomotion: Ambulation: 0: Activity did not occur  Comprehension Comprehension Mode: Auditory Comprehension: 5-Understands basic 90% of the time/requires cueing < 10% of the time  Expression Expression Mode: Verbal Expression: 5-Expresses basic 90% of the time/requires  cueing < 10% of the  time.  Social Interaction Social Interaction: 3-Interacts appropriately 50 - 74% of the time - May be physically or verbally inappropriate.  Problem Solving Problem Solving: 5-Solves basic 90% of the time/requires cueing < 10% of the time  Memory Memory: 5-Recognizes or recalls 90% of the time/requires cueing < 10% of the time  Medical Problem List and Plan:  1. Right second toe amputation 01/25/2012 secondary to gangrenous changes/history of left BKA and recent receive prosthesis. Darco shoe ordered for heel weight bearing RLE  -team working on don/doffing of prosthesis, will uses prosthesis for transfers only during this stay.  2. DVT Prophylaxis/Anticoagulation: Chronic Coumadin therapy for atrial fibrillation. Continue heparin therapy until INR therapeutic. Monitor for any bleeding episodes  3. Pain Management: Percocet dose adjusted down 4. Neuropsych: This patient is not capable of making decisions on his/her own behalf.  5. End stage renal disease/hemodialysis. Followup per renal services.    6. Diabetes mellitus with peripheral neuropathy. Latest hemoglobin A1c of 6.3. Continue sliding scale insulin and check blood sugars a.c. and at bedtime. Patient on Amaryll 4 mg daily prior to admission--- Resume at 2mg  today  -right now cbg's remain reasonable 7. Chronic anemia. Aranesp as directed as well as ferric gluconate.   Followup labs with each dialysis  8. Gout. Zyloprim. Monitor for any signs of flareup  9. Hypertension/atrial fibrillation. Lopressor 12.5 mg twice a day. Monitor with increased activity  10. Hyperlipidemia. Zocor  11. Wound care: continue dry dressing right 2nd toe 12. Leukocytosis: wbc's remain around 14k 13. Confusion: has waxed and waned  -limiting neurosedating meds such as percocet. (dose/schedule reduced)  -I suspect mild baseline dementia as well.  -late HD not helping either  LOS (Days) 4 A FACE TO FACE EVALUATION WAS PERFORMED  Alan [name]  T 02/01/2012 7:48 AM

## 2012-02-01 NOTE — Progress Notes (Signed)
Subjective:  Seems depressed Still in bed Didn't get back from HD until after 1AM "Can't get any  Sleep around here"  Objective:     Vital signs in last 24 hours: Filed Vitals:   01/31/12 2342 01/31/12 2350 02/01/12 0045 02/01/12 0529  BP: 115/63 110/86 116/62 107/64  Pulse: 102 92 102 100  Temp: 98.5 F (36.9 C)  98.6 F (37 C) 98.1 F (36.7 C)  TempSrc: Oral  Oral Oral  Resp: 19 18 18 18   Weight: 125.6 kg (276 lb 14.4 oz)  120.2 kg (264 lb 15.9 oz)   SpO2: 97%  100% 99%   Weight change:   Intake/Output Summary (Last 24 hours) at 02/01/12 0902 Last data filed at 01/31/12 2342  Gross per 24 hour  Intake    240 ml  Output   2628 ml  Net  -2388 ml    Physical Exam:  Blood pressure 107/64, pulse 100, temperature 98.1 F (36.7 C), temperature source Oral, resp. rate 18, weight 120.2 kg (264 lb 15.9 oz), SpO2 99.00%. Weights vary anywhere from 86 to 120 kg all over the board!!! Lungs clear  S1S2 no S3  2/6 murmur heard across precordium No diastolic murmur  Soft abdomen Non tender  Left BK stump no edema Right foot dressed  Trace RLE edema persistent  Right AVF good bruit   Labs: Labs to be done pre hd   Lab 01/31/12 2001 01/29/12 1628 01/26/12 0733  NA 130* 129* 133*  K 4.1 4.4 4.1  CL 90* 88* 92*  CO2 26 23 26   GLUCOSE 196* 176* 125*  BUN 43* 59* 50*  CREATININE 8.83* 10.30* 8.22*  ALB -- -- --  CALCIUM 8.7 8.9 8.7  PHOS 3.7 4.2 4.0     Lab 01/31/12 2001 01/29/12 1628 01/26/12 0733  AST -- -- --  ALT -- -- --  ALKPHOS -- -- --  BILITOT -- -- --  PROT -- -- --  ALBUMIN 2.4* 2.6* 2.4*   No results found for this basename: LIPASE:3,AMYLASE:3 in the last 168 hours No results found for this basename: AMMONIA:3 in the last 168 hours   Lab 01/31/12 1958 01/29/12 1628 01/28/12 0822 01/27/12 0630  WBC 12.4* 14.2* 13.7* 14.1*  NEUTROABS -- -- -- --  HGB 9.3* 9.3* 10.0* 10.9*  HCT 28.2* 28.8* 31.3* 34.1*  MCV 90.7 89.4 90.7 91.7  PLT 349 308 310 310       Lab 02/01/12 0729 02/01/12 0055 01/31/12 1625 01/31/12 1121 01/31/12 0714  GLUCAP 176* 120* 146* 201* 113*   Studies/Results: No results found.       [name] allopurinol  150 mg Oral Daily  . aspirin EC  81 mg Oral Daily  . calcium carbonate  1 tablet Oral TID WC  . darbepoetin (ARANESP) injection - DIALYSIS  150 mcg Intravenous Q Tue-HD  . doxercalciferol  4 mcg Intravenous Q T,Th,Sa-HD  . ferric gluconate (FERRLECIT/NULECIT) IV  125 mg Intravenous Q Thu-HD  . glimepiride  2 mg Oral Q breakfast  . insulin aspart  0-9 Units Subcutaneous TID WC  . metoprolol tartrate  12.5 mg Oral BID  . multivitamin  1 tablet Oral QHS  . pentafluoroprop-tetrafluoroeth      . simvastatin  40 mg Oral QHS  . warfarin  3 mg Oral ONCE-1800  . Warfarin - Pharmacist Dosing Inpatient   Does not apply q1800     I  have reviewed scheduled and prn medications. (outpt Dialysis Orders: Center: [facility]  on TTS .  EDW 106 kg HD Bath 2.0 k, 2.25 ca Time 4 hrs 15 min Heparin 3,400units. Access Right upper arm avf BFR 500 DFR 800 Hectoral 4 mcg IV/HD Epogen 1000 on tue and sat IV/HD Venofer 100mg  q weekly hd  Other 0 )  ASSESSMENT/RECOMMENDATIONS  1. Gangrene right foot s/p toe amp 1/17  VVS feels viable but high risk for limb loss with "50/50 chance of healing"  They will follow   2. ESRD  Cont TTS HD  Has extra vol on board Weights are totally unreliable (86-120 kg????) Pull 3-4 liters with next treatment Try to get some accurate weights  3. HTN/volume  Metoprolol plus HD   4. Anemia  Weekly FE  Aranesp 150 Hb still <10   5. AFib  Coumadin per pharmacy   6. CAD/CABG 1996/Mod AS  Not surgical candidate   7. DMII  Per primary   8. CKD-MBD  Binders  Was not on usual Hectorol - restarted (with 1/23 HD)    Camille Bal, MD [facility] [phone] Pager 02/01/2012, 9:02 AM

## 2012-02-02 ENCOUNTER — Inpatient Hospital Stay (HOSPITAL_COMMUNITY): Payer: MEDICARE | Admitting: Physical Therapy

## 2012-02-02 ENCOUNTER — Inpatient Hospital Stay (HOSPITAL_COMMUNITY): Payer: MEDICARE | Admitting: Occupational Therapy

## 2012-02-02 LAB — CULTURE, BLOOD (ROUTINE X 2)
Culture: NO GROWTH
Culture: NO GROWTH

## 2012-02-02 LAB — RENAL FUNCTION PANEL
CO2: 27 mEq/L (ref 19–32)
Calcium: 8.8 mg/dL (ref 8.4–10.5)
Creatinine, Ser: 7.38 mg/dL — ABNORMAL HIGH (ref 0.50–1.35)
GFR calc non Af Amer: 6 mL/min — ABNORMAL LOW (ref >90)
Glucose, Bld: 183 mg/dL — ABNORMAL HIGH (ref 70–99)

## 2012-02-02 LAB — PROTIME-INR
INR: 1.52 — ABNORMAL HIGH (ref 0.00–1.49)
Prothrombin Time: 17.9 seconds — ABNORMAL HIGH (ref 11.6–15.2)

## 2012-02-02 LAB — GLUCOSE, CAPILLARY: Glucose-Capillary: 99 mg/dL (ref 70–99)

## 2012-02-02 LAB — CBC
HCT: 29.8 % — ABNORMAL LOW (ref 39.0–52.0)
Hemoglobin: 9.5 g/dL — ABNORMAL LOW (ref 13.0–17.0)
MCH: 29.3 pg (ref 26.0–34.0)
MCHC: 31.9 g/dL (ref 30.0–36.0)

## 2012-02-02 MED ORDER — SODIUM CHLORIDE 0.9 % IV SOLN: 100.0000 mL | INTRAVENOUS | Status: DC | PRN

## 2012-02-02 MED ORDER — LIDOCAINE HCL (PF) 1 % IJ SOLN: 5.0000 mL | INTRAMUSCULAR | Status: DC | PRN

## 2012-02-02 MED ORDER — NEPRO/CARBSTEADY PO LIQD: 237.0000 mL | ORAL | Status: DC | PRN

## 2012-02-02 MED ORDER — PENTAFLUOROPROP-TETRAFLUOROETH EX AERO: 1.0000 "application " | INHALATION_SPRAY | CUTANEOUS | Status: DC | PRN

## 2012-02-02 MED ORDER — LIDOCAINE-PRILOCAINE 2.5-2.5 % EX CREA: 1.0000 "application " | TOPICAL_CREAM | CUTANEOUS | Status: DC | PRN

## 2012-02-02 MED ORDER — ALTEPLASE 2 MG IJ SOLR: 2.0000 mg | Freq: Once | INTRAMUSCULAR | Status: DC | PRN

## 2012-02-02 MED ORDER — HEPARIN SODIUM (PORCINE) 1000 UNIT/ML DIALYSIS: 1000.0000 [IU] | INTRAMUSCULAR | Status: DC | PRN

## 2012-02-02 MED ORDER — WARFARIN SODIUM 5 MG PO TABS
5.0000 mg | ORAL_TABLET | Freq: Once | ORAL | Status: AC
  Administered 2012-02-02: 5 mg via ORAL
  Filled 2012-02-02: qty 1

## 2012-02-02 MED ORDER — DOXERCALCIFEROL 4 MCG/2ML IV SOLN
INTRAVENOUS | Status: AC
  Administered 2012-02-02: 4 ug via INTRAVENOUS
  Filled 2012-02-02: qty 2

## 2012-02-02 MED ORDER — DIPHENHYDRAMINE HCL 25 MG PO CAPS
25.0000 mg | ORAL_CAPSULE | Freq: Every evening | ORAL | Status: DC | PRN
  Administered 2012-02-02: 25 mg via ORAL
  Filled 2012-02-02: qty 1

## 2012-02-02 NOTE — Progress Notes (Signed)
Occupational Therapy Session Notes  Patient Details  Name: Alan [name] MRN: [id number] Date of Birth: March 14, 1931  Today's Date: 02/02/2012  Short Term Goals: Week 1:  OT Short Term Goal 1 (Week 1): Patient will perform LB dressing with minimal assistance at bed level OT Short Term Goal 2 (Week 1): Patient will perform UB/LB bathing with minimal assistance at bed level OT Short Term Goal 3 (Week 1): Patient will perform toilet transfer with moderate assistance OT Short Term Goal 4 (Week 1): Patient will be able to donn prosthetic with mod verbal cues  Skilled Therapeutic Interventions/Progress Updates:   Session #1 [phone] - 55 Minutes Individual Therapy Patient with 7/10 complaints of pain, "yeah, when I see you come in I have pain". RN made aware Patient found seated edge of bed. Patient required moderate encouragement in order to participate in therapy. Therapist encouraged and started setting-up for BADL and patient willing to participate. Patient remained seated edge of bed for UB/LB bathing & dressing. Therapist allowed patient to choose pants and shirt. Focused skilled intervention on dynamic sitting balance/tolerance/endurance, UB/LB bathing & dressing, overall activity tolerance/endurance, pain management, donning of prosthesis, edge of bed -> w/c slide board transfer with close supervision, and grooming tasks seated at sink in w/c. Patient continues to have inappropriate behaviors/conversations during therapy sessions, therapist continues to redirect patient.   Session #2 [id number] - 25 Minutes Individual Therapy No complaints of pain Patient found seated in w/c upon entering room. Therapist propelled patient from room -> therapy gym for therapeutic exercise focusing on UE strengthening using SCIFIT machine. Patient took 3 breaks in a 13 minute exercise period. Therapist propelled patient back to room and left seated in w/c with bilateral leg rests donned and call bell &  phone within reach. Patient's wife present in room at end of session.   Precautions:  Precautions Precautions: Fall Precaution Comments: L prosthesis new for pt; R foot WB through heel only with Darco shoe Required Braces or Orthoses: Other Brace/Splint Other Brace/Splint: Post-up shoe for right foot [facility]) Restrictions Weight Bearing Restrictions: No RLE Weight Bearing: Partial weight bearing RLE Partial Weight Bearing Percentage or Pounds: PWB through heel only with DARCO shoe Other Position/Activity Restrictions: WB through R heel only; Darco shoe on RLE  See FIM for current functional status  [name] 02/02/2012, 7:52 AM

## 2012-02-02 NOTE — Progress Notes (Signed)
Subjective/Complaints:  no new issues. Occasional confusion. Flat affect  A 12 point review of systems has been performed and if not noted above is otherwise negative.   Objective: Vital Signs: Blood pressure 137/72, pulse 79, temperature 97.2 F (36.2 C), temperature source Oral, resp. rate 18, weight 120.2 kg (264 lb 15.9 oz), SpO2 100.00%. No results found.  Basename 01/31/12 1958  WBC 12.4*  HGB 9.3*  HCT 28.2*  PLT 349    Basename 01/31/12 2001  NA 130*  K 4.1  CL 90*  GLUCOSE 196*  BUN 43*  CREATININE 8.83*  CALCIUM 8.7   CBG (last 3)   Basename 02/02/12 0743 02/01/12 2123 02/01/12 1643  GLUCAP 99 132* 167*    Wt Readings from Last 3 Encounters:  02/01/12 120.2 kg (264 lb 15.9 oz)  01/27/12 103.692 kg (228 lb 9.6 oz)  01/27/12 103.692 kg (228 lb 9.6 oz)    Physical Exam:  HENT: mucosa pink  Head: Normocephalic.  Eyes:  Pupils round and reactive to light  Neck: Neck supple. No thyromegaly present.  Cardiovascular:  Cardiac rate controlled, with systolic murmur, no rub.  Pulmonary/Chest: Effort normal and breath sounds normal. No respiratory distress. No wheezes  Abdominal: Soft. Bowel sounds are normal. Non-distended Neurological:oriented today. Good sitting balance Able to follow simple commands. UE's 4/5. LLE 3/5 HF, RLE grossly 2+ to 3/5 with some limitations due to pain.  Skin:  Left BKA is well healed. dry dressing in place over right 2nd toe with minimal serosanginous dc, margins a little dark. Mild ulceration over lateral patella (left) resolving. Psychiatric:  Very flat,     Assessment/Plan: 1. Functional deficits secondary to right second toe amputation with a history of left BKA (pt just fitted with a prosthesis) which require 3+ hours per day of interdisciplinary therapy in a comprehensive inpatient rehab setting. Physiatrist is providing close team supervision and 24 hour management of active medical problems listed below. Physiatrist and  rehab team continue to assess barriers to discharge/monitor patient progress toward functional and medical goals. FIM: FIM - Bathing Bathing Steps Patient Completed: Chest;Right Arm;Left Arm;Abdomen;Front perineal area;Right upper leg;Left upper leg Bathing: 4: Min-Patient completes 8-9 66f 10 parts or 75+ percent  FIM - Upper Body Dressing/Undressing Upper body dressing/undressing steps patient completed: Thread/unthread right sleeve of pullover shirt/dresss;Thread/unthread left sleeve of pullover shirt/dress;Put head through opening of pull over shirt/dress;Pull shirt over trunk Upper body dressing/undressing: 5: Supervision: Safety issues/verbal cues FIM - Lower Body Dressing/Undressing Lower body dressing/undressing steps patient completed: Thread/unthread right pants leg;Thread/unthread left pants leg Lower body dressing/undressing: 3: Mod-Patient completed 50-74% of tasks  FIM - Toileting Toileting: 0: Activity did not occur  FIM - Diplomatic Services operational officer Devices: Bedside commode;Grab bars Toilet Transfers: 0-Activity did not occur  FIM - Architectural technologist Transfer: 5: Supine > Sit: Supervision (verbal cues/safety issues);4: Bed > Chair or W/C: Min A (steadying Pt. > 75%)  FIM - Locomotion: Wheelchair Locomotion: Wheelchair: 0: Activity did not occur FIM - Locomotion: Ambulation Locomotion: Ambulation: 0: Activity did not occur  Comprehension Comprehension Mode: Auditory Comprehension: 5-Understands basic 90% of the time/requires cueing < 10% of the time  Expression Expression Mode: Verbal Expression: 5-Expresses basic 90% of the time/requires cueing < 10% of the time.  Social Interaction Social Interaction: 3-Interacts appropriately 50 - 74% of the time - May be physically or verbally inappropriate.  Problem Solving Problem Solving: 5-Solves basic 90% of the time/requires cueing < 10%  of  the time  Memory Memory: 5-Recognizes or recalls 90% of the time/requires cueing < 10% of the time  Medical Problem List and Plan:  1. Right second toe amputation 01/25/2012 secondary to gangrenous changes/history of left BKA and recent receive prosthesis. Darco shoe ordered for heel weight bearing RLE  -team working on don/doffing of prosthesis, will uses prosthesis for transfers only during this stay.  2. DVT Prophylaxis/Anticoagulation: Chronic Coumadin therapy for atrial fibrillation. Continue heparin therapy until INR therapeutic. Monitor for any bleeding episodes  3. Pain Management: Percocet dose adjusted down and he's been more alert 4. Neuropsych: This patient is not capable of making decisions on his/her own behalf.  5. End stage renal disease/hemodialysis. Followup per renal services.    6. Diabetes mellitus with peripheral neuropathy. Latest hemoglobin A1c of 6.3. Continue sliding scale insulin and check blood sugars a.c. and at bedtime. Patient on Amaryll 4 mg daily prior to admission--- Resumed at 2mg  today  -will follow for pattern. 7. Chronic anemia. Aranesp as directed as well as ferric gluconate.   Followup labs with each dialysis  8. Gout. Zyloprim. Monitor for any signs of flareup  9. Hypertension/atrial fibrillation. Lopressor 12.5 mg twice a day. Monitor with increased activity  10. Hyperlipidemia. Zocor  11. Wound care: continue dry dressing right 2nd toe 12. Leukocytosis: wbc's remain around 14k 13. Confusion: has waxed and waned  -limiting neurosedating meds such as percocet. (dose/schedule reduced)  -I suspect mild baseline dementia as well.  -late HD not helping either  LOS (Days) 5 A FACE TO FACE EVALUATION WAS PERFORMED  SWARTZ,ZACHARY T 02/02/2012 8:05 AM

## 2012-02-02 NOTE — Progress Notes (Signed)
Physical Therapy Note  Patient Details  Name: Alan [name] MRN: [id number] Date of Birth: 01-16-1931 Today's Date: 02/02/2012  2440-1027 (55 minutes) individual Pain: no reported pain Focus of treatment: Transfer training (car); bilateral UE strengthening to improve transfers Treatment: wc <> car transfer with sliding board with assist for placement of board only SBA; UE ergonometer X 10 minutes; UE exercises using green theraband X 20 - elbow extension, bicep curls, shoulder depression, scapular adduction.    1500-1530 (30 minutes) individual Pain: no complaint of pain Focus of treatment: Sit to stand/standing tolerance to RW maintaining weight on RT heel only (in post op boot) Treatment: Sit to stand mod/max assist from wc (low) to RW X 3. Pt able to maintain standing with min/mod assist to prevent loss of balance posteriorly, forward flexed posture and mod vcs to maintain weight on RT heel only.  Avalina Benko,JIM 02/02/2012, 7:40 AM

## 2012-02-02 NOTE — Progress Notes (Addendum)
Subjective: Only complaint was how late he got out of dialysis last HD and how he was so tired because of that he couldn't do PT the next day  Objective Vital signs in last 24 hours: Filed Vitals:   02/01/12 0529 02/01/12 1620 02/01/12 2124 02/02/12 0618  BP: 107/64 110/53 144/75 137/72  Pulse: 100 92  79  Temp: 98.1 F (36.7 C) 97.9 F (36.6 C)  97.2 F (36.2 C)  TempSrc: Oral Oral  Oral  Resp: 18 16  18   Weight:      SpO2: 99% 100%  100%   Weight change:   Intake/Output Summary (Last 24 hours) at 02/02/12 1110 Last data filed at 02/01/12 2030  Gross per 24 hour  Intake    240 ml  Output    300 ml  Net    -60 ml   Labs: Basic Metabolic Panel:  Lab 01/31/12 1610 01/29/12 1628  NA 130* 129*  K 4.1 4.4  CL 90* 88*  CO2 26 23  GLUCOSE 196* 176*  BUN 43* 59*  CREATININE 8.83* 10.30*  ALB -- --  CALCIUM 8.7 8.9  PHOS 3.7 4.2   Liver Function Tests:  Lab 01/31/12 2001 01/29/12 1628  AST -- --  ALT -- --  ALKPHOS -- --  BILITOT -- --  PROT -- --  ALBUMIN 2.4* 2.6*   No results found for this basename: LIPASE:3,AMYLASE:3 in the last 168 hours No results found for this basename: AMMONIA:3 in the last 168 hours CBC:  Lab 01/31/12 1958 01/29/12 1628 01/28/12 0822 01/27/12 0630  WBC 12.4* 14.2* 13.7* 14.1*  NEUTROABS -- -- -- --  HGB 9.3* 9.3* 10.0* 10.9*  HCT 28.2* 28.8* 31.3* 34.1*  MCV 90.7 89.4 90.7 91.7  PLT 349 308 310 310   PT/INR: @labrcntip (inr:5) Cardiac Enzymes: No results found for this basename: CKTOTAL:5,CKMB:5,CKMBINDEX:5,TROPONINI:5 in the last 168 hours CBG:  Lab 02/02/12 0743 02/01/12 2123 02/01/12 1643 02/01/12 1132 02/01/12 0729  GLUCAP 99 132* 167* 145* 176*    Iron Studies: No results found for this basename: IRON:30,TIBC:30,TRANSFERRIN:30,FERRITIN:30 in the last 168 hours  Physical Exam:  Blood pressure 137/72, pulse 79, temperature 97.2 F (36.2 C), temperature source Oral, resp. rate 18, weight 120.2 kg (264 lb 15.9 oz), SpO2  100.00%.  Gen- alert, no distress Neck- no jvd Chest - clear bilat Cor- regular, +2/6 sem Abd- soft, no ascites Ext- 1+ pitting edema R lower leg only, L BKA with prosthesis, R foot wrapped Access- R arm AVF good bruit  Outpt Dialysis Orders: Center: [facility] on TTS .  EDW 106 kg, Bath 2.0K/2.25Ca, 4 hrs 15 min, Heparin 3400u, RUA AVF, 500/800 Hectoral 4ug/hd, Epogen 1000 on tue and sat only, Venofer 100mg  q weekly hd  Impression/Recommendations 1. Gangrene right foot, s/p 2nd toe am 1/17- at risk for limb loss still, VVS following 2. ESRD, cont hd tts- HD today 3. HTN/vol- low dose metoprolol only, BP normal.  New weight today of 90 kg pre HD, new EDW is prob 86-87 kg range. Pt has lost significant amt of LBW. Goal 3kg today. 4. Anemia of CKD- weekly Fe, aranesp 150, Hb 9-10 range 5. Afib- BB, and coumadin per pharm 6. CAD/CABG '96 / Mod AS- not surgical candidate 7. PAD, hx L BKA 2013 8. DMII- per primary 9. CKD-MBD, phos nl and Ca low nl- cont vit D and tums as binder   Vinson Moselle  MD [facility] [phone] pgr    [phone] cell  02/02/2012, 11:10 AM

## 2012-02-02 NOTE — Progress Notes (Signed)
ANTICOAGULATION CONSULT NOTE - Follow Up Consult  Pharmacy Consult for coumadin Indication: atrial fibrillation  Allergies  Allergen Reactions  . Ancef (Cefazolin) Rash    Patient Measurements: Weight: 277 lb 12.5 oz (126 kg) Heparin Dosing Weight:   Vital Signs: Temp: 98.1 F (36.7 C) (01/25 1550) Temp src: Oral (01/25 1550) BP: 123/59 mmHg (01/25 1730) Pulse Rate: 76  (01/25 1730)  Labs:  Alvira Philips 02/02/12 1645 02/02/12 0808 02/01/12 0640 01/31/12 2001 01/31/12 1958 01/31/12 0635  HGB 9.5* -- -- -- 9.3* --  HCT 29.8* -- -- -- 28.2* --  PLT 342 -- -- -- 349 --  APTT -- -- -- -- -- --  LABPROT -- 17.9* 20.8* -- -- 22.9*  INR -- 1.52* 1.87* -- -- 2.13*  HEPARINUNFRC -- -- -- -- -- --  CREATININE 7.38* -- -- 8.83* -- --  CKTOTAL -- -- -- -- -- --  CKMB -- -- -- -- -- --  TROPONINI -- -- -- -- -- --    The CrCl is unknown because both a height and weight (above a minimum accepted value) are required for this calculation.   Medications:  Scheduled:    . allopurinol  150 mg Oral Daily  . aspirin EC  81 mg Oral Daily  . calcium carbonate  1 tablet Oral TID WC  . darbepoetin (ARANESP) injection - DIALYSIS  150 mcg Intravenous Q Tue-HD  . doxercalciferol  4 mcg Intravenous Q T,Th,Sa-HD  . ferric gluconate (FERRLECIT/NULECIT) IV  125 mg Intravenous Q Thu-HD  . glimepiride  2 mg Oral Q breakfast  . insulin aspart  0-9 Units Subcutaneous TID WC  . metoprolol tartrate  12.5 mg Oral BID  . multivitamin  1 tablet Oral QHS  . simvastatin  40 mg Oral QHS  . [COMPLETED] warfarin  5 mg Oral ONCE-1800  . warfarin  5 mg Oral ONCE-1800  . Warfarin - Pharmacist Dosing Inpatient   Does not apply q1800    Assessment: INR is low at 1.52. INR was elevated when pt came in. Looks like a dose was missed on 1/23. That may explain decrease in INR. Will give 5 mg today. Goal of Therapy:  INR 2-3    Plan:  Coumadin 5 mg today Daily PT/INR  Eugene Garnet 02/02/2012,5:47  PM

## 2012-02-03 ENCOUNTER — Inpatient Hospital Stay (HOSPITAL_COMMUNITY): Payer: MEDICARE | Admitting: *Deleted

## 2012-02-03 LAB — GLUCOSE, CAPILLARY: Glucose-Capillary: 142 mg/dL — ABNORMAL HIGH (ref 70–99)

## 2012-02-03 MED ORDER — WARFARIN SODIUM 5 MG PO TABS
5.0000 mg | ORAL_TABLET | Freq: Once | ORAL | Status: AC
  Administered 2012-02-03: 5 mg via ORAL
  Filled 2012-02-03: qty 1

## 2012-02-03 MED ORDER — GLIMEPIRIDE 4 MG PO TABS
4.0000 mg | ORAL_TABLET | Freq: Every day | ORAL | Status: DC
  Administered 2012-02-04 – 2012-02-05 (×2): 4 mg via ORAL
  Filled 2012-02-03 (×4): qty 1

## 2012-02-03 MED ORDER — HYDROCORTISONE ACE-PRAMOXINE 1-1 % RE FOAM
1.0000 | Freq: Two times a day (BID) | RECTAL | Status: DC
  Administered 2012-02-04 (×2): 1 via RECTAL
  Filled 2012-02-03: qty 10

## 2012-02-03 NOTE — Progress Notes (Signed)
ANTICOAGULATION CONSULT NOTE - Follow Up Consult  Pharmacy Consult for Coumadin Indication: atrial fibrillation  Allergies  Allergen Reactions  . Ancef (Cefazolin) Rash    Patient Measurements: Weight:  (bed scale error; pt too drowsy to stand) Heparin Dosing Weight:   Vital Signs: Temp: 97.8 F (36.6 C) (01/26 0630) Temp src: Oral (01/26 0630) BP: 128/74 mmHg (01/26 0630) Pulse Rate: 78  (01/26 0731)  Labs:  Basename 02/03/12 1200 02/02/12 1645 02/02/12 0808 02/01/12 0640 01/31/12 2001 01/31/12 1958  HGB -- 9.5* -- -- -- 9.3*  HCT -- 29.8* -- -- -- 28.2*  PLT -- 342 -- -- -- 349  APTT -- -- -- -- -- --  LABPROT 20.8* -- 17.9* 20.8* -- --  INR 1.87* -- 1.52* 1.87* -- --  HEPARINUNFRC -- -- -- -- -- --  CREATININE -- 7.38* -- -- 8.83* --  CKTOTAL -- -- -- -- -- --  CKMB -- -- -- -- -- --  TROPONINI -- -- -- -- -- --    The CrCl is unknown because both a height and weight (above a minimum accepted value) are required for this calculation.   Medications:  Scheduled:    . allopurinol  150 mg Oral Daily  . aspirin EC  81 mg Oral Daily  . calcium carbonate  1 tablet Oral TID WC  . darbepoetin (ARANESP) injection - DIALYSIS  150 mcg Intravenous Q Tue-HD  . doxercalciferol  4 mcg Intravenous Q T,Th,Sa-HD  . ferric gluconate (FERRLECIT/NULECIT) IV  125 mg Intravenous Q Thu-HD  . glimepiride  4 mg Oral Q breakfast  . hydrocortisone-pramoxine  1 applicator Rectal BID  . insulin aspart  0-9 Units Subcutaneous TID WC  . metoprolol tartrate  12.5 mg Oral BID  . multivitamin  1 tablet Oral QHS  . simvastatin  40 mg Oral QHS  . [COMPLETED] warfarin  5 mg Oral ONCE-1800  . Warfarin - Pharmacist Dosing Inpatient   Does not apply q1800  . [DISCONTINUED] glimepiride  2 mg Oral Q breakfast    Assessment: 79yo male with AFib.  INR 1.87 this AM, no bleeding problems noted.'  Goal of Therapy:  INR 2-3 Monitor platelets by anticoagulation protocol: Yes   Plan:  Repeat  Coumadin 5mg   Marisue Humble, PharmD Clinical Pharmacist [facility]

## 2012-02-03 NOTE — Progress Notes (Signed)
Patient has large mushy/boggy purple red area to pad of foot below amputated toe. Amputation site appears red. Dr Riley Kill notified of boggy/mushy area to pad of foot No new orders received. Moist to dry dressing changed. Darco boot reapplied. Continue with plan of care.

## 2012-02-03 NOTE — Progress Notes (Signed)
Physical Therapy Session Note  Patient Details  Name: SELSO MANNOR MRN: [id number] Date of Birth: [birth year]  Today's Date: 02/03/2012 Time: 0930-1015 Time Calculation (min): 45 min Therapy Documentation Precautions:  Precautions Precautions: Fall Precaution Comments: L prosthesis new for pt; R foot WB through heel only with Darco shoe Required Braces or Orthoses: Other Brace/Splint Other Brace/Splint: Post-up shoe for right foot (DARCO) Restrictions Weight Bearing Restrictions: Yes RLE Weight Bearing: Partial weight bearing RLE Partial Weight Bearing Percentage or Pounds: PWB through heel only with DARCO shoe Other Position/Activity Restrictions: WB through R heel only; Darco shoe on RLE Pain: Pain Assessment Pain Assessment: No/denies pain  Therapeutic Activities: Patient was in the bathroom at the beginning of the session, training is safe transfer from the toilet to  w/c max A, patient is agitated and did not follow cues provided to make this transfer easier and safer.  Sit to stand in parallel bars x 5, with standing for up to 2 min, verbal and tactile cues provided for posture end weight shifting. Patient is not very cooperative, requires constant redirection to perform task. Hand restorator  5 min. W/c management; Initially patient refused to propel himself, after being cued and educated on importance of being able to maneuver and propel his chair he agreed to perform activity 50 feet. When therapist suggested to continue propelling, patient got agitated and did not want to further participate in w/c mobility.  See FIM for current functional status  Therapy/Group: Individual Therapy  Dorna Mai 02/03/2012, 12:18 PM

## 2012-02-03 NOTE — Progress Notes (Signed)
Subjective/Complaints: Slept a little better with benadryl  A 12 point review of systems has been performed and if not noted above is otherwise negative.   Objective: Vital Signs: Blood pressure 128/74, pulse 78, temperature 97.8 F (36.6 C), temperature source Oral, resp. rate 17, weight 124.2 kg (273 lb 13 oz), SpO2 99.00%. No results found.  Basename 02/02/12 1645 01/31/12 1958  WBC 10.6* 12.4*  HGB 9.5* 9.3*  HCT 29.8* 28.2*  PLT 342 349    Basename 02/02/12 1645 01/31/12 2001  NA 131* 130*  K 4.0 4.1  CL 91* 90*  GLUCOSE 183* 196*  BUN 32* 43*  CREATININE 7.38* 8.83*  CALCIUM 8.8 8.7   CBG (last 3)   Basename 02/03/12 0727 02/02/12 2141 02/02/12 1221  GLUCAP 110* 106* 207*    Wt Readings from Last 3 Encounters:  02/02/12 124.2 kg (273 lb 13 oz)  01/27/12 103.692 kg (228 lb 9.6 oz)  01/27/12 103.692 kg (228 lb 9.6 oz)    Physical Exam:  HENT: mucosa pink  Head: Normocephalic.  Eyes:  Pupils round and reactive to light  Neck: Neck supple. No thyromegaly present.  Cardiovascular:  Cardiac rate controlled, with systolic murmur, no rub.  Pulmonary/Chest: Effort normal and breath sounds normal. No respiratory distress. No wheezes  Abdominal: Soft. Bowel sounds are normal. Non-distended Neurological:oriented today. Good sitting balance Able to follow simple commands. UE's 4/5. LLE 3/5 HF, RLE grossly 2+ to 3/5 with some limitations due to pain.  Skin:  Left BKA is well healed. dry dressing in place over right 2nd toe with minimal serosanginous dc, margins a little dark. Mild ulceration over lateral patella (left) resolving. Psychiatric:  Very flat,     Assessment/Plan: 1. Functional deficits secondary to right second toe amputation with a history of left BKA (pt just fitted with a prosthesis) which require 3+ hours per day of interdisciplinary therapy in a comprehensive inpatient rehab setting. Physiatrist is providing close team supervision and 24 hour  management of active medical problems listed below. Physiatrist and rehab team continue to assess barriers to discharge/monitor patient progress toward functional and medical goals. FIM: FIM - Bathing Bathing Steps Patient Completed: Chest;Right Arm;Left Arm;Abdomen;Front perineal area;Buttocks;Right upper leg;Left upper leg Bathing: 5: Supervision: Safety issues/verbal cues  FIM - Upper Body Dressing/Undressing Upper body dressing/undressing steps patient completed: Thread/unthread left sleeve of pullover shirt/dress;Put head through opening of pull over shirt/dress;Pull shirt over trunk;Thread/unthread right sleeve of pullover shirt/dresss Upper body dressing/undressing: 5: Set-up assist to: Obtain clothing/put away FIM - Lower Body Dressing/Undressing Lower body dressing/undressing steps patient completed: Thread/unthread right pants leg;Thread/unthread left pants leg Lower body dressing/undressing: 3: Mod-Patient completed 50-74% of tasks  FIM - Toileting Toileting: 0: Activity did not occur  FIM - Diplomatic Services operational officer Devices: Bedside commode;Grab bars Toilet Transfers: 0-Activity did not occur  FIM - Architectural technologist Transfer: 5: Bed > Chair or W/C: Supervision (verbal cues/safety issues)  FIM - Locomotion: Wheelchair Locomotion: Wheelchair: 0: Activity did not occur FIM - Locomotion: Ambulation Locomotion: Ambulation: 0: Activity did not occur  Comprehension Comprehension Mode: Auditory Comprehension: 5-Follows basic conversation/direction: With no assist  Expression Expression Mode: Verbal Expression: 5-Expresses basic 90% of the time/requires cueing < 10% of the time.  Social Interaction Social Interaction: 3-Interacts appropriately 50 - 74% of the time - May be physically or verbally inappropriate.  Problem Solving Problem Solving: 5-Solves basic 90% of the time/requires cueing <  10% of the time  Memory Memory: 5-Recognizes or recalls 90% of the time/requires cueing < 10% of the time  Medical Problem List and Plan:  1. Right second toe amputation 01/25/2012 secondary to gangrenous changes/history of left BKA and recent receive prosthesis. Darco shoe ordered for heel weight bearing RLE  -team working on don/doffing of prosthesis, will uses prosthesis for transfers only during this stay.  2. DVT Prophylaxis/Anticoagulation: Chronic Coumadin therapy for atrial fibrillation. Continue heparin therapy until INR therapeutic. Monitor for any bleeding episodes  3. Pain Management: Percocet dose adjusted down and he's been more alert 4. Neuropsych: This patient is not capable of making decisions on his/her own behalf.  5. End stage renal disease/hemodialysis. Followup per renal services.    6. Diabetes mellitus with peripheral neuropathy. Latest hemoglobin A1c of 6.3. Continue sliding scale insulin and check blood sugars a.c. and at bedtime. Patient on Amaryll 4 mg daily prior to admission--- increased to 4mg  today  -will follow for pattern. 7. Chronic anemia. Aranesp as directed as well as ferric gluconate.   Followup labs with each dialysis  8. Gout. Zyloprim. Monitor for any signs of flareup  9. Hypertension/atrial fibrillation. Lopressor 12.5 mg twice a day. Monitor with increased activity  10. Hyperlipidemia. Zocor  11. Wound care: continue dry dressing right 2nd toe 12. Leukocytosis: wbc's remain around 14k 13. Confusion: has waxed and waned  -limiting neurosedating meds such as percocet. (dose/schedule reduced)  -I suspect mild baseline dementia as well.  -late HD not helping either  LOS (Days) 6 A FACE TO FACE EVALUATION WAS PERFORMED  [name] T 02/03/2012 7:47 AM

## 2012-02-04 ENCOUNTER — Inpatient Hospital Stay (HOSPITAL_COMMUNITY): Payer: MEDICARE

## 2012-02-04 ENCOUNTER — Inpatient Hospital Stay (HOSPITAL_COMMUNITY): Payer: MEDICARE | Admitting: *Deleted

## 2012-02-04 ENCOUNTER — Encounter (HOSPITAL_COMMUNITY): Payer: MEDICARE

## 2012-02-04 DIAGNOSIS — S88119A Complete traumatic amputation at level between knee and ankle, unspecified lower leg, initial encounter: Secondary | ICD-10-CM

## 2012-02-04 DIAGNOSIS — N186 End stage renal disease: Secondary | ICD-10-CM

## 2012-02-04 DIAGNOSIS — S98139A Complete traumatic amputation of one unspecified lesser toe, initial encounter: Secondary | ICD-10-CM

## 2012-02-04 DIAGNOSIS — I70269 Atherosclerosis of native arteries of extremities with gangrene, unspecified extremity: Secondary | ICD-10-CM

## 2012-02-04 LAB — GLUCOSE, CAPILLARY
Glucose-Capillary: 145 mg/dL — ABNORMAL HIGH (ref 70–99)
Glucose-Capillary: 132 mg/dL — ABNORMAL HIGH (ref 70–99)

## 2012-02-04 LAB — PROTIME-INR: INR: 2.21 — ABNORMAL HIGH (ref 0.00–1.49)

## 2012-02-04 MED ORDER — VANCOMYCIN HCL 10 G IV SOLR
2000.0000 mg | Freq: Once | INTRAVENOUS | Status: DC
  Filled 2012-02-04: qty 2000

## 2012-02-04 MED ORDER — PIPERACILLIN-TAZOBACTAM IN DEX 2-0.25 GM/50ML IV SOLN
2.2500 g | Freq: Three times a day (TID) | INTRAVENOUS | Status: DC
  Filled 2012-02-04 (×3): qty 50

## 2012-02-04 MED ORDER — VANCOMYCIN HCL IN DEXTROSE 1-5 GM/200ML-% IV SOLN
1000.0000 mg | INTRAVENOUS | Status: DC
  Filled 2012-02-04: qty 200

## 2012-02-04 MED ORDER — VANCOMYCIN HCL IN DEXTROSE 1-5 GM/200ML-% IV SOLN: 1000.0000 mg | INTRAVENOUS | Status: DC | PRN

## 2012-02-04 MED ORDER — WARFARIN SODIUM 5 MG PO TABS
5.0000 mg | ORAL_TABLET | Freq: Once | ORAL | Status: AC
  Administered 2012-02-04: 5 mg via ORAL
  Filled 2012-02-04: qty 1

## 2012-02-04 MED ORDER — PIPERACILLIN-TAZOBACTAM IN DEX 2-0.25 GM/50ML IV SOLN
2.2500 g | Freq: Three times a day (TID) | INTRAVENOUS | Status: DC
  Administered 2012-02-04 – 2012-02-06 (×5): 2.25 g via INTRAVENOUS
  Filled 2012-02-04 (×7): qty 50

## 2012-02-04 NOTE — Progress Notes (Signed)
Physical Therapy Session Note  Patient Details  Name: Alan [name] MRN: [id number] Date of Birth: Dec 24, 1931  Today's Date: 02/04/2012 Time: 4098-1191 Time Calculation (min): 39 min  Short Term Goals: Week 1:  PT Short Term Goal 1 (Week 1): Pt will transfer with min A bed <-> chair PT Short Term Goal 2 (Week 1): Pt will propel w/c x 150' on unit  with S PT Short Term Goal 3 (Week 1): Pt will be able to manage w/c parts and slideboard for transfers with S  Skilled Therapeutic Interventions/Progress Updates:   Pt's wife present during therapy session and family education performed in regards to car transfers using SB, w/c parts management, and overall safety education. Pt focused on possible need for further amputation in R foot for which he is awaiting another doctor and hesitant to participate or leave the room due to this. Encouraged pt that the doctor will find him in therapy and therapist left a message with the nurse as well. currently orders remain for WB through heel with Darco shoe. With encouragement pt agreeable to leave room and practice simulated car transfer with pt's wife using slideboard; cues given for safer transfer but able to return demonstrate. W/c propulsion for endurance and strengthening back to pt room with rest breaks as needed due to fatigue.   Therapy Documentation Precautions:  Precautions Precautions: Fall Precaution Comments: L prosthesis new for pt; R foot WB through heel only with Darco shoe Required Braces or Orthoses: Other Brace/Splint Other Brace/Splint: Post-up shoe for right foot (DARCO) Restrictions Weight Bearing Restrictions: Yes RLE Weight Bearing: Partial weight bearing RLE Partial Weight Bearing Percentage or Pounds: PWB through heel with darco boot on (PBW through heel with darco boots on) Other Position/Activity Restrictions: WB through R heel only; Darco shoe on RLE General: Amount of Missed PT Time (min): 6 Minutes Missed Time  Reason: Patient fatigue Pain:  Denies pain.     See FIM for current functional status  Therapy/Group: Individual Therapy  [name] [facility] 02/04/2012, 1:42 PM

## 2012-02-04 NOTE — Progress Notes (Addendum)
[facility] [place]  Postoperative Visit - Amputation  Date of Surgery: 01/25/12  Right second toe ray amp Surgeon: Alan Alexandria, Alan [name] POD: #10  Subjective Alan [name] is a 79 y.o. male who is S/P Right 2nd toe ray amp. We were called to look at wound as it has become soft and gangrenous at the base of the foot. He also has a lateral malleolus wound .  Pt.denies increased pain in the amp site. He has no sensation in the foot.   Significant Diagnostic Studies: CBC Lab Results  Component Value Date   WBC 10.6* 02/02/2012   HGB 9.5* 02/02/2012   HCT 29.8* 02/02/2012   MCV 92.0 02/02/2012   PLT 342 02/02/2012    BMET    Component Value Date/Time   NA 131* 02/02/2012 1645   K 4.0 02/02/2012 1645   CL 91* 02/02/2012 1645   CO2 27 02/02/2012 1645   GLUCOSE 183* 02/02/2012 1645   BUN 32* 02/02/2012 1645   CREATININE 7.38* 02/02/2012 1645   CALCIUM 8.8 02/02/2012 1645   CALCIUM 8.8 10/27/2010 0734   GFRNONAA 6* 02/02/2012 1645   GFRAA 7* 02/02/2012 1645    COAG Lab Results  Component Value Date   INR 2.21* 02/04/2012   INR 1.87* 02/03/2012   INR 1.52* 02/02/2012   No results found for this basename: PTT     Intake/Output Summary (Last 24 hours) at 02/04/12 1014 Last data filed at 02/04/12 0700  Gross per 24 hour  Intake    360 ml  Output      0 ml  Net    360 ml   Patient Vitals for the past 24 hrs:  Urine Occurrence Stool Color  02/03/12 1832 1  Brown     Physical Examination  BP Readings from Last 3 Encounters:  02/04/12 131/81  01/28/12 100/64  01/28/12 100/64   Temp Readings from Last 3 Encounters:  02/04/12 98.3 F (36.8 C) Oral  01/28/12 98.9 F (37.2 C) Oral  01/28/12 98.9 F (37.2 C) Oral   SpO2 Readings from Last 3 Encounters:  02/04/12 98%  01/28/12 94%  01/28/12 94%   Pulse Readings from Last 3 Encounters:  02/04/12 79  01/28/12 107  01/28/12 107    Pt is A&Ox3  WDWN male with no complaints  Right amputation wound is  necrotic.  The gangrene has progressed in area of ray amp and the bottom of the foot has and area which is black and soft. No purulent drainage or erythema noted Pt has small nickel sized superficial wound over the lateral malleolus as well Assessment/plan:  Alan [name] is a 79 y.o. male who is s/p Right second toe ray amp- this is breaking down and there is further necrotic tissue in the area. He also has lateral malleolus wound as well. Pt will probably need further amp - ? TMA Vs. Higher Will discuss with [name] Will  Begin IV Vancomycin with HD and zosyn  Alan [name] 10:14 AM 02/04/[phone]

## 2012-02-04 NOTE — Plan of Care (Signed)
Problem: RH SKIN INTEGRITY Goal: RH STG SKIN FREE OF INFECTION/BREAKDOWN Outcome: Progressing No additional skin breakdown

## 2012-02-04 NOTE — Consult Note (Signed)
02/04/12  PSYCHOSOCIAL NOTE - CONFIDENTIAL  [facility]   [name] was seen on the [facility].  He was admitted to the rehab unit owing to functional deficits secondary to right second toe amputation with a history of left below the knee amputation. A neuropsychology consult was ordered to assess the patient's present cognitive and emotional status and to assist in treatment planning.   Upon entering Alan [name] room, he immediately started interrupting this provider when an introduction was attempted. It was very difficult to hold a conversation with the patient as he became quite irritated and constantly interrupted me. Even when his wife was addressed he answered for her and did not allow her to speak. After just a few minutes he was done with the conversation and asked this provider to leave his room. As such, the appointment was ended and minimal information was gathered.   No follow-up scheduled.  REFERRING DIAGNOSIS: Below the knee amputation  FINAL DIAGNOSIS:  Below the knee amputation  Total professional time including medical record review and feedback equals 2 units ([id number]).   Debbe Mounts, Psy.D.  Clinical Neuropsychologist

## 2012-02-04 NOTE — Progress Notes (Signed)
Subjective/Complaints: No complaints today. Denies pain. Slept well.  A 12 point review of systems has been performed and if not noted above is otherwise negative.   Objective: Vital Signs: Blood pressure 131/81, pulse 79, temperature 98.3 F (36.8 C), temperature source Oral, resp. rate 18, weight 102.9 kg (226 lb 13.7 oz), SpO2 98.00%. No results found.  Basename 02/02/12 1645  WBC 10.6*  HGB 9.5*  HCT 29.8*  PLT 342    Basename 02/02/12 1645  NA 131*  K 4.0  CL 91*  GLUCOSE 183*  BUN 32*  CREATININE 7.38*  CALCIUM 8.8   CBG (last 3)   Basename 02/04/12 0721 02/03/12 2036 02/03/12 1705  GLUCAP 115* 142* 140*    Wt Readings from Last 3 Encounters:  02/04/12 102.9 kg (226 lb 13.7 oz)  01/27/12 103.692 kg (228 lb 9.6 oz)  01/27/12 103.692 kg (228 lb 9.6 oz)    Physical Exam:  HENT: mucosa pink  Head: Normocephalic.  Eyes:  Pupils round and reactive to light  Neck: Neck supple. No thyromegaly present.  Cardiovascular:  Cardiac rate controlled, with systolic murmur, no rub.  Pulmonary/Chest: Effort normal and breath sounds normal. No respiratory distress. No wheezes  Abdominal: Soft. Bowel sounds are normal. Non-distended Neurological:oriented today. Good sitting balance Able to follow simple commands. UE's 4/5. LLE 3/5 HF, RLE grossly 2+ to 3/5 with some limitations due to pain.  Skin:  Left BKA is well healed. dry dressing in place over right 2nd toe with minimal serosanginous dc, margin and MT area is soft, appears dark, ischemic, has drainage.  Psychiatric:  Very flat,     Assessment/Plan: 1. Functional deficits secondary to right second toe amputation with a history of left BKA (pt just fitted with a prosthesis) which require 3+ hours per day of interdisciplinary therapy in a comprehensive inpatient rehab setting. Physiatrist is providing close team supervision and 24 hour management of active medical problems listed below. Physiatrist and rehab team  continue to assess barriers to discharge/monitor patient progress toward functional and medical goals. FIM: FIM - Bathing Bathing Steps Patient Completed: Chest;Right Arm;Left Arm;Abdomen;Front perineal area;Buttocks;Right upper leg;Left upper leg Bathing: 5: Supervision: Safety issues/verbal cues  FIM - Upper Body Dressing/Undressing Upper body dressing/undressing steps patient completed: Thread/unthread left sleeve of pullover shirt/dress;Put head through opening of pull over shirt/dress;Pull shirt over trunk;Thread/unthread right sleeve of pullover shirt/dresss Upper body dressing/undressing: 5: Set-up assist to: Obtain clothing/put away FIM - Lower Body Dressing/Undressing Lower body dressing/undressing steps patient completed: Thread/unthread right pants leg;Thread/unthread left pants leg Lower body dressing/undressing: 3: Mod-Patient completed 50-74% of tasks  FIM - Toileting Toileting: 0: Activity did not occur  FIM - Diplomatic Services operational officer Devices: Best boy Transfers: 3-To toilet/BSC: Mod A (lift or lower assist)  FIM - Banker Devices: Sliding board Bed/Chair Transfer: 0: Activity did not occur  FIM - Locomotion: Wheelchair Locomotion: Wheelchair: 1: Travels less than 50 ft with moderate assistance (Pt: 50 - 74%) FIM - Locomotion: Ambulation Locomotion: Ambulation: 0: Activity did not occur  Comprehension Comprehension Mode: Auditory Comprehension: 5-Follows basic conversation/direction: With no assist  Expression Expression Mode: Verbal Expression: 5-Expresses basic 90% of the time/requires cueing < 10% of the time.  Social Interaction Social Interaction: 4-Interacts appropriately 75 - 89% of the time - Needs redirection for appropriate language or to initiate interaction.  Problem Solving Problem Solving: 5-Solves basic 90% of the time/requires cueing < 10% of the time  Memory Memory:  5-Recognizes or  recalls 90% of the time/requires cueing < 10% of the time  Medical Problem List and Plan:  1. Right second toe amputation 01/25/2012 secondary to gangrenous changes/history of left BKA and recent receive prosthesis. Darco shoe ordered for heel weight bearing RLE  -team working on don/doffing of prosthesis, will uses prosthesis for transfers only during this stay.  2. DVT Prophylaxis/Anticoagulation: Chronic Coumadin therapy for atrial fibrillation. Continue heparin therapy until INR therapeutic. Monitor for any bleeding episodes  3. Pain Management: Percocet dose adjusted down and he's been more alert 4. Neuropsych: This patient is not capable of making decisions on his/her own behalf.  5. End stage renal disease/hemodialysis. Followup per renal services.    6. Diabetes mellitus with peripheral neuropathy. Latest hemoglobin A1c of 6.3. Continue sliding scale insulin and check blood sugars a.c. and at bedtime. Patient on Amaryll 4 mg daily prior to admission--- increased to 4mg  today  -will follow for pattern. 7. Chronic anemia. Aranesp as directed as well as ferric gluconate.   Followup labs with each dialysis  8. Gout. Zyloprim. Monitor for any signs of flareup  9. Hypertension/atrial fibrillation. Lopressor 12.5 mg twice a day. Monitor with increased activity  10. Hyperlipidemia. Zocor  11. Wound care: will ask surgery for follow up as MT area proximal to amp site is breaking down. NWB for now  -same dressing at present 12. Leukocytosis: wbc's remain around 14k 13. Confusion: has waxed and waned  -limiting neurosedating meds such as percocet. (dose/schedule reduced)  -I suspect mild baseline dementia as well.  -late HD not helping either  LOS (Days) 7 A FACE TO FACE EVALUATION WAS PERFORMED  Levon Penning T 02/04/2012 7:37 AM

## 2012-02-04 NOTE — Progress Notes (Signed)
Occupational  Therapy Note  Patient Details  Name: ELYJAH HAZAN MRN: [id number] Date of Birth: December 22, 1931 Today's Date: 02/04/2012  Time:  0800-0900  ( ) Individual session Pain:  None  Therapeutic activities:  Bed mobility, transfers to wc and toilet.  Pt. Is mod assist with transfers to toilet.  Pt. Has left prosthetic limb and needed re education about loosening the screw in order to don it.  Pt. Knew sequence of prosthesis and then compression socks.  Pt.worried regarding his right foot and the fact that more of the leg may need to be removed.  Provided encouragement.  Pt. Is now PWB on right foot on the heel only which meant a more scooting type transfer was performed.  Educated pt on this but needs reinforcement.      Humberto Seals 02/04/2012, 8:54 AM

## 2012-02-04 NOTE — Progress Notes (Signed)
Physical Therapy Session Note  Patient Details  Name: Alan [name] MRN: [id number] Date of Birth: [birth year]  Today's Date: 02/04/2012 Time: 900-957 (57 min)  Short Term Goals: Week 1:  PT Short Term Goal 1 (Week 1): Pt will transfer with min A bed <-> chair PT Short Term Goal 2 (Week 1): Pt will propel w/c x 150' on unit  with S PT Short Term Goal 3 (Week 1): Pt will be able to manage w/c parts and slideboard for transfers with S  Skilled Therapeutic Interventions/Progress Updates:    Focused on w/c mobility on unit and in home environment in ADL apartment with S for functional mobility and overall endurance. Practiced slide board transfer w/c <-> soft bed in ADL apartment with close S; assist and cues with w/c parts management and appropriate SB placement. Seated therex for PNF diagonals and trunk rotation with weighted medicine ball x 10 reps x 2 sets each direction; LAQ x 10 reps each side x 2 sets.   Therapy Documentation Precautions:  Precautions Precautions: Fall Precaution Comments: L prosthesis new for pt; R foot WB through heel only with Darco shoe Required Braces or Orthoses: Other Brace/Splint Other Brace/Splint: Post-up shoe for right foot (DARCO) Restrictions Weight Bearing Restrictions: Yes RLE Weight Bearing: Partial weight bearing RLE Partial Weight Bearing Percentage or Pounds: PWB (PWB to heel with boot) Other Position/Activity Restrictions: WB through R heel only; Darco shoe on RLE  Pain:  No complaints - reports concern with R foot stating the doctor was coming to look at it today.   See FIM for current functional status  Therapy/Group: Individual Therapy  [name] [facility] 02/04/2012, 9:43 AM

## 2012-02-04 NOTE — Plan of Care (Signed)
Problem: RH SKIN INTEGRITY Goal: RH STG ABLE TO PERFORM INCISION/WOUND CARE W/ASSISTANCE STG Able To Perform Incision/Wound Care With Assistance. Max A  Outcome: Progressing Requiring total A with dressing change at this time.

## 2012-02-04 NOTE — Progress Notes (Signed)
Occupational Therapy Note  Patient Details  Name: Alan [name] MRN: [id number] Date of Birth: 1931-06-16 Today's Date: 02/04/2012  Time: 1400-1430 Pt denies pain Individual Therapy  Pt seated in w/c upon arrival.  Pt initially reluctant to participate in therapy stating he was waiting for a MD to come talk to him about his foot.  I assured patient that MD would be able to find him in gym.  Pt agreed to participate in BUE therex to increase endurance and ability to perform transfers and BADLs.  Pt engaged in PNF exercises with weighted ball (1 KG) and shoulder/elbow exercises with 3# weight bar.  Pt rolled w/c back to room at end of session.   Lavone Neri [facility] 02/04/2012, 2:40 PM

## 2012-02-04 NOTE — Progress Notes (Signed)
ANTIBIOTIC CONSULT NOTE - INITIAL  Pharmacy Consult for zosyn and vancomycin Indication: gangrene s/p R 2nd ray amputation 01/17  Allergies  Allergen Reactions  . Ancef (Cefazolin) Rash    Patient Measurements: Weight: 226 lb 13.7 oz (102.9 kg) Adjusted Body Weight: 102.9 kg  Vital Signs: Temp: 98.3 F (36.8 C) (01/27 0552) Temp src: Oral (01/27 0552) BP: 131/81 mmHg (01/27 0552) Pulse Rate: 79  (01/27 0552) Intake/Output from previous day: 01/26 0701 - 01/27 0700 In: 360 [P.O.:360] Out: -  Intake/Output from this shift:    Labs:  Basename 02/02/12 1645  WBC 10.6*  HGB 9.5*  PLT 342  LABCREA --  CREATININE 7.38*   The CrCl is unknown because both a height and weight (above a minimum accepted value) are required for this calculation. No results found for this basename: VANCOTROUGH:2,VANCOPEAK:2,VANCORANDOM:2,GENTTROUGH:2,GENTPEAK:2,GENTRANDOM:2,TOBRATROUGH:2,TOBRAPEAK:2,TOBRARND:2,AMIKACINPEAK:2,AMIKACINTROU:2,AMIKACIN:2, in the last 72 hours   Microbiology: Recent Results (from the past 720 hour(s))  CULTURE, BLOOD (ROUTINE X 2)     Status: Normal   Collection Time   01/22/12  6:40 AM      Component Value Range Status Comment   Specimen Description BLOOD RIGHT ARM   Final    Special Requests BOTTLES DRAWN AEROBIC AND ANAEROBIC 10CC   Final    Culture  Setup Time 01/22/2012 15:58   Final    Culture NO GROWTH 5 DAYS   Final    Report Status 01/28/2012 FINAL   Final   CULTURE, BLOOD (ROUTINE X 2)     Status: Normal   Collection Time   01/22/12  6:45 AM      Component Value Range Status Comment   Specimen Description BLOOD LEFT ANTECUBITAL   Final    Special Requests BOTTLES DRAWN AEROBIC AND ANAEROBIC 10CC   Final    Culture  Setup Time 01/22/2012 15:58   Final    Culture NO GROWTH 5 DAYS   Final    Report Status 01/28/2012 FINAL   Final   MRSA PCR SCREENING     Status: Normal   Collection Time   01/22/12  7:45 AM      Component Value Range Status Comment   MRSA by PCR NEGATIVE  NEGATIVE Final   SURGICAL PCR SCREEN     Status: Normal   Collection Time   01/24/12  4:26 PM      Component Value Range Status Comment   MRSA, PCR NEGATIVE  NEGATIVE Final    Staphylococcus aureus NEGATIVE  NEGATIVE Final   WOUND CULTURE     Status: Normal   Collection Time   01/25/12 11:21 AM      Component Value Range Status Comment   Specimen Description WOUND TOE   Final    Special Requests RT SECOND TOE AMPUTATION SITE PT ON VANC ZOSYN   Final    Gram Stain     Final    Value: ABUNDANT WBC PRESENT, PREDOMINANTLY PMN     NO SQUAMOUS EPITHELIAL CELLS SEEN     MODERATE GRAM POSITIVE COCCI     IN PAIRS   Culture     Final    Value: MODERATE STAPHYLOCOCCUS AUREUS     Note: RIFAMPIN AND GENTAMICIN SHOULD NOT BE USED AS SINGLE DRUGS FOR TREATMENT OF STAPH INFECTIONS. This organism DOES NOT demonstrate inducible Clindamycin resistance in vitro.   Report Status 01/28/2012 FINAL   Final    Organism ID, Bacteria STAPHYLOCOCCUS AUREUS   Final   CULTURE, BLOOD (ROUTINE X 2)     Status:  Normal   Collection Time   01/27/12 12:35 AM      Component Value Range Status Comment   Specimen Description BLOOD LEFT HAND   Final    Special Requests BOTTLES DRAWN AEROBIC ONLY 3CC   Final    Culture  Setup Time 01/27/2012 12:13   Final    Culture NO GROWTH 5 DAYS   Final    Report Status 02/02/2012 FINAL   Final   CULTURE, BLOOD (ROUTINE X 2)     Status: Normal   Collection Time   01/27/12 12:45 AM      Component Value Range Status Comment   Specimen Description BLOOD LEFT HAND   Final    Special Requests BOTTLES DRAWN AEROBIC ONLY 3CC   Final    Culture  Setup Time 01/27/2012 12:13   Final    Culture NO GROWTH 5 DAYS   Final    Report Status 02/02/2012 FINAL   Final     Medical History: Past Medical History  Diagnosis Date  . Diabetes mellitus   . Gout   . Hypertension   . Hyperlipidemia   . Chronic kidney disease     Started dialysis October 2012  . Arthritis   .  Prostate cancer     s/p seed implant  . Cataract     BILATERAL-BEEN REMOVED  . Neuromuscular disorder     DABETIC NEUROPATHY-LOWER EXTREMITY  . Atrial fibrillation     on coumadin  . Aortic stenosis     moderate to severe, not felt to be a surgical candidate  . Anemia     secondary to end stage renal disease  . Coronary artery disease     remote CABG in 1996; last stress test in 2005; last cath 2005  . Diabetic neuropathy   . Edema   . PAD (peripheral artery disease)   . Diabetic retinopathy(362.0)   . Chronic anticoagulation     on coumadin  . Nephrolithiasis     prior history of percutaneous nephrostomy  . Shortness of breath     Medications:  Scheduled:    . allopurinol  150 mg Oral Daily  . aspirin EC  81 mg Oral Daily  . calcium carbonate  1 tablet Oral TID WC  . darbepoetin (ARANESP) injection - DIALYSIS  150 mcg Intravenous Q Tue-HD  . doxercalciferol  4 mcg Intravenous Q T,Th,Sa-HD  . ferric gluconate (FERRLECIT/NULECIT) IV  125 mg Intravenous Q Thu-HD  . glimepiride  4 mg Oral Q breakfast  . hydrocortisone-pramoxine  1 applicator Rectal BID  . insulin aspart  0-9 Units Subcutaneous TID WC  . metoprolol tartrate  12.5 mg Oral BID  . multivitamin  1 tablet Oral QHS  . simvastatin  40 mg Oral QHS  . [COMPLETED] warfarin  5 mg Oral ONCE-1800  . warfarin  5 mg Oral ONCE-1800  . Warfarin - Pharmacist Dosing Inpatient   Does not apply q1800   Infusions:   Assessment: 79 yo male with gangrene s/p R 2nd ray amputation will be started and zosyn and vancomycin therapy.  Patient has ESRD on HD TThSa  Goal of Therapy:  Pre-HD vancomycin level 15-25  Plan:  1) Zosyn 2.25g iv q8h and Vancomycin 2g iv x1 today, then 1g iv every HD TThSa 2) F/u plan on ABX  Stanley Helmuth, [name] 02/04/2012,1:30 PM

## 2012-02-04 NOTE — Progress Notes (Signed)
ANTICOAGULATION CONSULT NOTE - Follow Up Consult  Pharmacy Consult for coumadin Indication: atrial fibrillation  Allergies  Allergen Reactions  . Ancef (Cefazolin) Rash    Patient Measurements: Weight: 226 lb 13.7 oz (102.9 kg) Heparin Dosing Weight:   Vital Signs: Temp: 98.3 F (36.8 C) (01/27 0552) Temp src: Oral (01/27 0552) BP: 131/81 mmHg (01/27 0552) Pulse Rate: 79  (01/27 0552)  Labs:  Basename 02/04/12 0730 02/03/12 1200 02/02/12 1645 02/02/12 0808  HGB -- -- 9.5* --  HCT -- -- 29.8* --  PLT -- -- 342 --  APTT -- -- -- --  LABPROT 23.6* 20.8* -- 17.9*  INR 2.21* 1.87* -- 1.52*  HEPARINUNFRC -- -- -- --  CREATININE -- -- 7.38* --  CKTOTAL -- -- -- --  CKMB -- -- -- --  TROPONINI -- -- -- --    The CrCl is unknown because both a height and weight (above a minimum accepted value) are required for this calculation.   Medications:  Scheduled:    . allopurinol  150 mg Oral Daily  . aspirin EC  81 mg Oral Daily  . calcium carbonate  1 tablet Oral TID WC  . darbepoetin (ARANESP) injection - DIALYSIS  150 mcg Intravenous Q Tue-HD  . doxercalciferol  4 mcg Intravenous Q T,Th,Sa-HD  . ferric gluconate (FERRLECIT/NULECIT) IV  125 mg Intravenous Q Thu-HD  . glimepiride  4 mg Oral Q breakfast  . hydrocortisone-pramoxine  1 applicator Rectal BID  . insulin aspart  0-9 Units Subcutaneous TID WC  . metoprolol tartrate  12.5 mg Oral BID  . multivitamin  1 tablet Oral QHS  . simvastatin  40 mg Oral QHS  . [COMPLETED] warfarin  5 mg Oral ONCE-1800  . warfarin  5 mg Oral ONCE-1800  . Warfarin - Pharmacist Dosing Inpatient   Does not apply q1800   Infusions:    Assessment: 80 yo male with afib is currently on therapeutic coumadin.  INR up from 1.87 to 2.21 today. Goal of Therapy:  INR 2-3    Plan:  1) Repeat coumadin 5mg  po x1 2) INR in am  Alan [name], [name] 02/04/2012,8:30 AM

## 2012-02-04 NOTE — Progress Notes (Signed)
Subjective: Alert, no complaints  Objective Vital signs in last 24 hours: Filed Vitals:   02/03/12 1554 02/03/12 1822 02/03/12 2035 02/04/12 0552  BP: 102/65  111/68 131/81  Pulse: 96  83 79  Temp: 98.2 F (36.8 C)   98.3 F (36.8 C)  TempSrc: Oral   Oral  Resp: 18   18  Weight:  103.3 kg (227 lb 11.8 oz)  102.9 kg (226 lb 13.7 oz)  SpO2: 97%   98%   Weight change: 12.9 kg (28 lb 7 oz)  Intake/Output Summary (Last 24 hours) at 02/04/12 1045 Last data filed at 02/04/12 0700  Gross per 24 hour  Intake    360 ml  Output      0 ml  Net    360 ml   Labs: Basic Metabolic Panel:  Lab 02/02/12 9604 01/31/12 2001 01/29/12 1628  NA 131* 130* 129*  K 4.0 4.1 4.4  CL 91* 90* 88*  CO2 27 26 23   GLUCOSE 183* 196* 176*  BUN 32* 43* 59*  CREATININE 7.38* 8.83* 10.30*  ALB -- -- --  CALCIUM 8.8 8.7 8.9  PHOS 3.0 3.7 4.2   Liver Function Tests:  Lab 02/02/12 1645 01/31/12 2001 01/29/12 1628  AST -- -- --  ALT -- -- --  ALKPHOS -- -- --  BILITOT -- -- --  PROT -- -- --  ALBUMIN 2.4* 2.4* 2.6*   No results found for this basename: LIPASE:3,AMYLASE:3 in the last 168 hours No results found for this basename: AMMONIA:3 in the last 168 hours CBC:  Lab 02/02/12 1645 01/31/12 1958 01/29/12 1628  WBC 10.6* 12.4* 14.2*  NEUTROABS -- -- --  HGB 9.5* 9.3* 9.3*  HCT 29.8* 28.2* 28.8*  MCV 92.0 90.7 89.4  PLT 342 349 308   PT/INR: @labrcntip (inr:5) Cardiac Enzymes: No results found for this basename: CKTOTAL:5,CKMB:5,CKMBINDEX:5,TROPONINI:5 in the last 168 hours CBG:  Lab 02/04/12 0721 02/03/12 2036 02/03/12 1705 02/03/12 1151 02/03/12 0727  GLUCAP 115* 142* 140* 154* 110*    Iron Studies: No results found for this basename: IRON:30,TIBC:30,TRANSFERRIN:30,FERRITIN:30 in the last 168 hours  Physical Exam:  Blood pressure 131/81, pulse 79, temperature 98.3 F (36.8 C), temperature source Oral, resp. rate 18, weight 102.9 kg (226 lb 13.7 oz), SpO2 98.00%.  Gen- alert, no  distress Neck- no jvd Chest - clear bilat Cor- regular, +2/6 sem Abd- soft, no ascites Ext- 1+ pitting edema R lower leg only, L BKA with prosthesis, R foot wrapped Access- R arm AVF good bruit  Outpt Dialysis Orders: Center: [facility] on TTS .  EDW 106 kg, Bath 2.0K/2.25Ca, 4 hrs 15 min, Heparin 3400u, RUA AVF, 500/800 Hectoral 4ug/hd, Epogen 1000 on tue and sat only, Venofer 100mg  q weekly hd  Impression/Recommendations  1. Gangrene s/p R 2nd ray amp 1/17 High risk of limb loss still per VVS notes VVS following Off abx  2. ESRD TTS HD Hep 3400u  3. HTN/volume Euvolemic / low-dose BB New dry wt 86-90 kg Bed wts very difficult to believe  4. Anemia of CKD IV Fe weekly Darbe 150/Tues Hb 9-10  5.  Afib BB and coum per pharm  6.  CKD-MBD Ca/phos wnl Binder and vit D  7. PAD, hx L BKA 2013  8. DMII  9. CAD/CABG '96 / Mod AS Not a surgical candidate  Vinson Moselle  MD [facility] [phone] pgr    [phone] cell 02/04/2012, 10:45 AM

## 2012-02-05 ENCOUNTER — Inpatient Hospital Stay (HOSPITAL_COMMUNITY): Payer: MEDICARE

## 2012-02-05 ENCOUNTER — Inpatient Hospital Stay (HOSPITAL_COMMUNITY): Payer: MEDICARE | Admitting: *Deleted

## 2012-02-05 LAB — GLUCOSE, CAPILLARY
Glucose-Capillary: 112 mg/dL — ABNORMAL HIGH (ref 70–99)
Glucose-Capillary: 174 mg/dL — ABNORMAL HIGH (ref 70–99)

## 2012-02-05 LAB — PROTIME-INR
INR: 2.52 — ABNORMAL HIGH (ref 0.00–1.49)
Prothrombin Time: 26 seconds — ABNORMAL HIGH (ref 11.6–15.2)

## 2012-02-05 MED ORDER — VANCOMYCIN HCL IN DEXTROSE 1-5 GM/200ML-% IV SOLN: 1000.0000 mg | INTRAVENOUS | Status: DC

## 2012-02-05 MED ORDER — VANCOMYCIN HCL 10 G IV SOLR
2000.0000 mg | Freq: Once | INTRAVENOUS | Status: AC
  Administered 2012-02-05: 2000 mg via INTRAVENOUS
  Filled 2012-02-05 (×3): qty 2000

## 2012-02-05 MED ORDER — WARFARIN SODIUM 2.5 MG PO TABS
2.5000 mg | ORAL_TABLET | Freq: Once | ORAL | Status: AC
  Administered 2012-02-05: 2.5 mg via ORAL
  Filled 2012-02-05: qty 1

## 2012-02-05 NOTE — Progress Notes (Signed)
Physical Therapy Session Note  Patient Details  Name: Alan [name] MRN: [id number] Date of Birth: 01/09/1931  Today's Date: 02/05/2012 Time: 2440-1027 Time Calculation (min): 41 min  Short Term Goals: Week 1:  PT Short Term Goal 1 (Week 1): Pt will transfer with min A bed <-> chair PT Short Term Goal 2 (Week 1): Pt will propel w/c x 150' on unit  with S PT Short Term Goal 3 (Week 1): Pt will be able to manage w/c parts and slideboard for transfers with S  Skilled Therapeutic Interventions/Progress Updates:    Treatment focused on functional transfers without slideboard with S (cues for w/c parts management); seated core strengthening using blue weighted medicine ball for PNF diagonals and trunk rotation, w/c propulsion for endurance and strengthening, and pt demonstrating putting prosthesis on/off. Pt required assist with locking prosthesis but able to verbally direct sequence.  Therapy Documentation Precautions:  Precautions Precautions: Fall Precaution Comments: Darco shoe on RLE; new order 1/28 for NWB (was partial through heel only)  PAIN  No compliants  See FIM for current functional status  Therapy/Group: Individual Therapy  [name] [facility] 02/05/2012, 10:34 AM

## 2012-02-05 NOTE — Progress Notes (Signed)
Physical Therapy Session Note  Patient Details  Name: Alan [name] MRN: [id number] Date of Birth: [birth year]  Today's Date: 02/05/2012 Time: 1445-1500 Time Calculation (min): 15 min  Short Term Goals: Week 1:  PT Short Term Goal 1 (Week 1): Pt will transfer with min A bed <-> chair PT Short Term Goal 2 (Week 1): Pt will propel w/c x 150' on unit  with S PT Short Term Goal 3 (Week 1): Pt will be able to manage w/c parts and slideboard for transfers with S  Skilled Therapeutic Interventions/Progress Updates:    Pt refused session due to getting news that he would be needing further amputation to RLE and pt states he plans to d/c first (he was saying he wanted to leave tonight after dialysis) and then be re-admitted for amputation. Discussed with RN who was contacting MD and will need to get in touch with wife. Assisted pt with transfer back to bed in preparation for HD but declined any further treatment.   Therapy Documentation Precautions:  Precautions Precautions: Fall Precaution Comments: L prosthesis new for pt; R foot WB through heel only with Darco shoe Required Braces or Orthoses: Other Brace/Splint Other Brace/Splint: Post-up shoe for right foot [facility]) Restrictions Weight Bearing Restrictions: Yes RLE Weight Bearing: Non weight bearing RLE Partial Weight Bearing Percentage or Pounds: Darco shoe on RLE (new order for NWB 1/28; was partial through heel prior) Other Position/Activity Restrictions:   General: Amount of Missed PT Time (min): 30 Minutes Missed Time Reason: Patient unwilling/refused to participate without medical reason Pain:  No complaints  See FIM for current functional status  Therapy/Group: Individual Therapy  [name] [facility] 02/05/2012, 3:19 PM

## 2012-02-05 NOTE — Progress Notes (Signed)
ANTICOAGULATION CONSULT NOTE - Follow Up Consult  Pharmacy Consult for coumadin Indication: atrial fibrillation  Allergies  Allergen Reactions  . Ancef (Cefazolin) Rash    Patient Measurements: Weight: 228 lb 9.9 oz (103.7 kg) Heparin Dosing Weight:   Vital Signs: Temp: 98.2 F (36.8 C) (01/28 0500) Temp src: Oral (01/28 0500) BP: 125/60 mmHg (01/28 0843) Pulse Rate: 63  (01/28 0500)  Labs:  Basename 02/05/12 0555 02/04/12 0730 02/03/12 1200 02/02/12 1645  HGB -- -- -- 9.5*  HCT -- -- -- 29.8*  PLT -- -- -- 342  APTT -- -- -- --  LABPROT 26.0* 23.6* 20.8* --  INR 2.52* 2.21* 1.87* --  HEPARINUNFRC -- -- -- --  CREATININE -- -- -- 7.38*  CKTOTAL -- -- -- --  CKMB -- -- -- --  TROPONINI -- -- -- --    The CrCl is unknown because both a height and weight (above a minimum accepted value) are required for this calculation.   Medications:  Scheduled:     . allopurinol  150 mg Oral Daily  . aspirin EC  81 mg Oral Daily  . calcium carbonate  1 tablet Oral TID WC  . darbepoetin (ARANESP) injection - DIALYSIS  150 mcg Intravenous Q Tue-HD  . doxercalciferol  4 mcg Intravenous Q T,Th,Sa-HD  . ferric gluconate (FERRLECIT/NULECIT) IV  125 mg Intravenous Q Thu-HD  . glimepiride  4 mg Oral Q breakfast  . hydrocortisone-pramoxine  1 applicator Rectal BID  . insulin aspart  0-9 Units Subcutaneous TID WC  . metoprolol tartrate  12.5 mg Oral BID  . multivitamin  1 tablet Oral QHS  . piperacillin-tazobactam (ZOSYN)  IV  2.25 g Intravenous Q8H  . simvastatin  40 mg Oral QHS  . vancomycin  2,000 mg Intravenous Once  . vancomycin  1,000 mg Intravenous Q T,Th,Sa-HD  . [COMPLETED] warfarin  5 mg Oral ONCE-1800  . Warfarin - Pharmacist Dosing Inpatient   Does not apply q1800  . [DISCONTINUED] piperacillin-tazobactam (ZOSYN)  IV  2.25 g Intravenous Q8H   Infusions:    Assessment: 79 yo male with history of afib and he continues on chronic coumadin.  INR is 2.52 today. INR is  therapeutic. No bleeding reported.   His PTA coumadin dose was 5mg  daily except 2.5mg  qFriday and INR was supratherapeutic (=4.18) on hospital admission date 01/21/12.  He has received 5mg  daily over the past 4 days and INR has ranged from 2.13- 1.87- 1.52- 1.87- 2.21 to 2.52 today.  IV heparin infusion was discontinued on 01/28/12 when INR reached 2.18.   Vancomycin and Zosyn per pharmacy started yesterday.  Vancomycin 2 gm IV not given on 1/27.  Patient to go to dialysis today. We will give the 2 g IV Vancomycin after HD today 1/28 then 1g IV after each HD starting with next HD on Thurs.   Goal of Therapy:  INR 2-3    Plan:  1) Give coumadin 2.5mg  po x1 2) INR in am 3) Plan to give Vancomycin today post hemodialysis.   Alan [name], RPh Clinical Pharmacist Pager: [phone] 02/05/2012,1:17 PM

## 2012-02-05 NOTE — Progress Notes (Signed)
Occupational Therapy Note  Patient Details  Name: Alan [name] MRN: [id number] Date of Birth: December 26, 1931 Today's Date: 02/05/2012  Time: 0900-0958 Pt denies pain Individual Therapy Pt seated EOB upon arrival.  Pt attempting to don prosthesis on LLE and was asking Environmental staff to assist.  Therapist provided task initiation and patient was able to complete task.  Prosthesis was difficult to lock in until one sock removed.  The task of donning prosthesis required 45 mins primarily because patient required constant cueing for correct donning of prosthesis.  Pt transferred to w/c with supervision and transitioned to gym for BUE therex on UE ergonometer.    Lavone Neri [facility] 02/05/2012, 2:53 PM

## 2012-02-05 NOTE — Progress Notes (Signed)
Physical Therapy Session Note  Patient Details  Name: Alan [name] MRN: [id number] Date of Birth: [birth year]  Today's Date: 02/05/2012 Time: 13:04-13:45 ( )  missed due to pt refusing to participate while IV still running antibiotics.   Short Term Goals: Week 1:  PT Short Term Goal 1 (Week 1): Pt will transfer with min A bed <-> chair PT Short Term Goal 2 (Week 1): Pt will propel w/c x 150' on unit  with S PT Short Term Goal 3 (Week 1): Pt will be able to manage w/c parts and slideboard for transfers with S  Skilled Therapeutic Interventions/Progress Updates:  Tx focused on therex for increasing LE and UE strength and ROM. Pt needing max encouragement to participate at all today, very frustrated with possibility of additional amputation.  Pt performed each of the following bil 2x15 with cues for technique and seated rest breaks prn:  Marching, LAQ, hip ADD pillow squeeze, hip ABD with orange theraband, HS curl with orange theraband.  D1, D2 UE diagonals with orange theraband with R/L UE, rowing and shoulder ABD all with orange theraband, 2x15 with multiple cues for technique.  Pt not wanting to transfer to bed prior to dialysis. Pt left up in [facility] with all needs in reach.      Therapy Documentation Precautions:  Precautions Precautions: Fall Precaution Comments:NWB R LE Required Braces or Orthoses: Other Brace/Splint Other Brace/Splint: Post-up shoe for right foot [facility]) Restrictions Weight Bearing Restrictions: Yes RLE Weight Bearing: Non weight bearing RLE Partial Weight Bearing Percentage or Pounds: Darco shoe on RLE (new order for NWB 1/28; was partial through heel prior) Other Position/Activity Restrictions:    See FIM for current functional status  Therapy/Group: Individual Therapy  Clydene Laming, PT, DPT  02/05/2012, 1:41 PM

## 2012-02-05 NOTE — Discharge Summary (Signed)
  Discharge summary job # [id number]

## 2012-02-05 NOTE — Progress Notes (Signed)
. [facility] [place]  Subjective  -  The patient has previously undergone a right toe amputation which is failing to heal.   Physical Exam:  There is a wedge-shaped area on the plantar surface of the right foot at the well of the coaptation site which has nonviable tissue. I debrided the blister and confirmed the tissue is nonviable. The cellulitis which was present on his great toe has improved over the past 24-48 hours. The amputation site itself continues to remain marginal.       Assessment/Plan:  Right foot ulcer  I had a lengthy, approximately 30 minute discussion with the patient outlining our options. I do not feel that his foot is salvageable at this time. He does not have options for revascularization. Therefore, the most clinical decision will be at what level of agitation to proceed with. I presented 3 options #1 would be to proceed with a below-the-knee amputation. #2 would be to proceed with a transmetatarsal hypertension. #3 would be to go to the operating room planning a trans-metatarsal amputation, however if the wound is clearly nonviable, to proceed with a below-the-knee amputation at that setting. The patient wishes to proceed with #3. He currently is in rehabilitation but given the fact that he is going to lose part or all of his right leg he wishes to go home to address her personal needs. I feel this is reasonable as he is currently on Coumadin and needs time for his INR to correct. Stop his Coumadin today  Alan [name], Alan [name] 02/05/2012 3:13 PM --  Filed Vitals:   02/05/12 0843  BP: 125/60  Pulse:   Temp:   Resp:     Intake/Output Summary (Last 24 hours) at 02/05/12 1513 Last data filed at 02/05/12 1300  Gross per 24 hour  Intake    480 ml  Output      0 ml  Net    480 ml     Laboratory CBC    Component Value Date/Time   WBC 10.6* 02/02/2012 1645   HGB 9.5* 02/02/2012 1645   HCT 29.8* 02/02/2012 1645   PLT 342 02/02/2012  1645    BMET    Component Value Date/Time   NA 131* 02/02/2012 1645   K 4.0 02/02/2012 1645   CL 91* 02/02/2012 1645   CO2 27 02/02/2012 1645   GLUCOSE 183* 02/02/2012 1645   BUN 32* 02/02/2012 1645   CREATININE 7.38* 02/02/2012 1645   CALCIUM 8.8 02/02/2012 1645   CALCIUM 8.8 10/27/2010 0734   GFRNONAA 6* 02/02/2012 1645   GFRAA 7* 02/02/2012 1645    COAG Lab Results  Component Value Date   INR 2.52* 02/05/2012   INR 2.21* 02/04/2012   INR 1.87* 02/03/2012   No results found for this basename: PTT    Antibiotics Anti-infectives     Start     Dose/Rate Route Frequency Ordered Stop   02/07/12 1200   vancomycin (VANCOCIN) IVPB 1000 mg/200 mL premix        1,000 mg 200 mL/hr over 60 Minutes Intravenous Every T-Th-Sa (Hemodialysis) 02/05/12 1347     02/05/12 1600   vancomycin (VANCOCIN) 2,000 mg in sodium chloride 0.9 % 500 mL IVPB        2,000 mg 250 mL/hr over 120 Minutes Intravenous  Once 02/05/12 1344     02/05/12 1200   vancomycin (VANCOCIN) IVPB 1000 mg/200 mL premix  Status:  Discontinued        1,000  mg 200 mL/hr over 60 Minutes Intravenous Every T-Th-Sa (Hemodialysis) 02/04/12 1337 02/05/12 1348   02/04/12 2000  piperacillin-tazobactam (ZOSYN) IVPB 2.25 g       2.25 g 100 mL/hr over 30 Minutes Intravenous Every 8 hours 02/04/12 1958     02/04/12 1530   piperacillin-tazobactam (ZOSYN) IVPB 2.25 g  Status:  Discontinued        2.25 g 100 mL/hr over 30 Minutes Intravenous Every 8 hours 02/04/12 1337 02/04/12 1958   02/04/12 1430   vancomycin (VANCOCIN) 2,000 mg in sodium chloride 0.9 % 500 mL IVPB  Status:  Discontinued        2,000 mg 250 mL/hr over 120 Minutes Intravenous  Once 02/04/12 1337 02/05/12 1344   02/04/12 1324   vancomycin (VANCOCIN) IVPB 1000 mg/200 mL premix  Status:  Discontinued        1,000 mg 200 mL/hr over 60 Minutes Intravenous Every Hemodialysis 02/04/12 1325 02/04/12 1338           V. Charlena Cross, M.D. [facility] [place] Office: [phone] Pager:  [phone]

## 2012-02-05 NOTE — Progress Notes (Signed)
Subjective/Complaints: No issues at present. Denies new pain  A 12 point review of systems has been performed and if not noted above is otherwise negative.   Objective: Vital Signs: Blood pressure 90/54, pulse 63, temperature 98.2 F (36.8 C), temperature source Oral, resp. rate 19, weight 103.7 kg (228 lb 9.9 oz), SpO2 99.00%. No results found.  Basename 02/02/12 1645  WBC 10.6*  HGB 9.5*  HCT 29.8*  PLT 342    Basename 02/02/12 1645  NA 131*  K 4.0  CL 91*  GLUCOSE 183*  BUN 32*  CREATININE 7.38*  CALCIUM 8.8   CBG (last 3)   Basename 02/05/12 0715 02/04/12 2118 02/04/12 1656  GLUCAP 72 132* 166*    Wt Readings from Last 3 Encounters:  02/05/12 103.7 kg (228 lb 9.9 oz)  01/27/12 103.692 kg (228 lb 9.6 oz)  01/27/12 103.692 kg (228 lb 9.6 oz)    Physical Exam:  HENT: mucosa pink  Head: Normocephalic.  Eyes:  Pupils round and reactive to light  Neck: Neck supple. No thyromegaly present.  Cardiovascular:  Cardiac rate controlled, with systolic murmur, no rub.  Pulmonary/Chest: Effort normal and breath sounds normal. No respiratory distress. No wheezes  Abdominal: Soft. Bowel sounds are normal. Non-distended Neurological:oriented today. Good sitting balance Able to follow simple commands. UE's 4/5. LLE 3/5 HF, RLE grossly 2+ to 3/5 with some limitations due to pain.  Skin:  Left BKA is well healed. dry dressing in place over right 2nd toe with minimal serosanginous dc, margin and MT area is soft, appears dark, ischemic, has drainage.  Psychiatric:  Very flat,     Assessment/Plan: 1. Functional deficits secondary to right second toe amputation with a history of left BKA (pt just fitted with a prosthesis) which require 3+ hours per day of interdisciplinary therapy in a comprehensive inpatient rehab setting. Physiatrist is providing close team supervision and 24 hour management of active medical problems listed below. Physiatrist and rehab team continue to  assess barriers to discharge/monitor patient progress toward functional and medical goals. FIM: FIM - Bathing Bathing Steps Patient Completed: Chest;Right Arm;Left Arm;Abdomen;Front perineal area;Right upper leg;Left upper leg;Buttocks Bathing: 5: Set-up assist to: Open items  FIM - Upper Body Dressing/Undressing Upper body dressing/undressing steps patient completed: Thread/unthread right sleeve of pullover shirt/dresss;Thread/unthread left sleeve of pullover shirt/dress;Put head through opening of pull over shirt/dress;Pull shirt over trunk Upper body dressing/undressing: 5: Set-up assist to: Obtain clothing/put away FIM - Lower Body Dressing/Undressing Lower body dressing/undressing steps patient completed: Thread/unthread right pants leg;Thread/unthread left pants leg Lower body dressing/undressing: 3: Mod-Patient completed 50-74% of tasks  FIM - Toileting Toileting steps completed by patient: Performs perineal hygiene Toileting: 2: Max-Patient completed 1 of 3 steps  FIM - Diplomatic Services operational officer Devices: Grab bars Toilet Transfers: 3-From toilet/BSC: Mod A (lift or lower assist);3-To toilet/BSC: Mod A (lift or lower assist)  FIM - Banker Devices: Sliding board;Arm rests Bed/Chair Transfer: 5: Supine > Sit: Supervision (verbal cues/safety issues);4: Bed > Chair or W/C: Min A (steadying Pt. > 75%)  FIM - Locomotion: Wheelchair Locomotion: Wheelchair: 5: Travels 150 ft or more: maneuvers on rugs and over door sills with supervision, cueing or coaxing FIM - Locomotion: Ambulation Locomotion: Ambulation: 0: Activity did not occur  Comprehension Comprehension Mode: Auditory Comprehension: 5-Follows basic conversation/direction: With extra time/assistive device  Expression Expression Mode: Verbal Expression: 5-Expresses basic 90% of the time/requires cueing < 10% of the time.  Social Interaction Social Interaction:  5-Interacts  appropriately 90% of the time - Needs monitoring or encouragement for participation or interaction.  Problem Solving Problem Solving: 5-Solves basic 90% of the time/requires cueing < 10% of the time  Memory Memory: 5-Recognizes or recalls 90% of the time/requires cueing < 10% of the time  Medical Problem List and Plan:  1. Right second toe amputation 01/25/2012 secondary to gangrenous changes/history of left BKA and recent receive prosthesis. Darco shoe ordered for heel weight bearing RLE  -basic pros ed 2. DVT Prophylaxis/Anticoagulation: Chronic Coumadin therapy for atrial fibrillation. Continue heparin therapy until INR therapeutic. Monitor for any bleeding episodes  3. Pain Management: Percocet dose adjusted down and he's been more alert 4. Neuropsych: This patient is not capable of making decisions on his/her own behalf.  5. End stage renal disease/hemodialysis. Followup per renal services.    6. Diabetes mellitus with peripheral neuropathy. Latest hemoglobin A1c of 6.3. Continue sliding scale insulin and check blood sugars a.c. and at bedtime. Patient on Amaryll 4 mg daily prior to admission--- increased to 4mg  today  -will follow for pattern. 7. Chronic anemia. Aranesp as directed as well as ferric gluconate.   Followup labs with each dialysis  8. Gout. Zyloprim. Monitor for any signs of flareup  9. Hypertension/atrial fibrillation. Lopressor 12.5 mg twice a day. Monitor with increased activity  10. Hyperlipidemia. Zocor  11. Wound care:surgery assessing for further rx of foot. May need further amputation  -continue current dressing and NWB 12. Leukocytosis: wbc's remain around 14k 13. Confusion: has waxed and waned  -limiting neurosedating meds such as percocet. (dose/schedule reduced)  -I suspect mild baseline dementia as well.  -late HD not helping either  LOS (Days) 8 A FACE TO FACE EVALUATION WAS PERFORMED  SWARTZ,ZACHARY T 02/05/2012 7:42 AM

## 2012-02-06 ENCOUNTER — Encounter (HOSPITAL_COMMUNITY): Payer: MEDICARE | Admitting: Occupational Therapy

## 2012-02-06 ENCOUNTER — Inpatient Hospital Stay (HOSPITAL_COMMUNITY): Payer: MEDICARE

## 2012-02-06 ENCOUNTER — Inpatient Hospital Stay (HOSPITAL_COMMUNITY): Payer: MEDICARE | Admitting: Physical Therapy

## 2012-02-06 ENCOUNTER — Inpatient Hospital Stay (HOSPITAL_COMMUNITY): Payer: MEDICARE | Admitting: Occupational Therapy

## 2012-02-06 LAB — CBC
HCT: 31.6 % — ABNORMAL LOW (ref 39.0–52.0)
Hemoglobin: 9.9 g/dL — ABNORMAL LOW (ref 13.0–17.0)
MCH: 28.4 pg (ref 26.0–34.0)
MCHC: 31.3 g/dL (ref 30.0–36.0)
MCV: 90.8 fL (ref 78.0–100.0)

## 2012-02-06 LAB — RENAL FUNCTION PANEL
BUN: 53 mg/dL — ABNORMAL HIGH (ref 6–23)
Glucose, Bld: 80 mg/dL (ref 70–99)
Phosphorus: 5.1 mg/dL — ABNORMAL HIGH (ref 2.3–4.6)
Potassium: 4.5 mEq/L (ref 3.5–5.1)

## 2012-02-06 LAB — GLUCOSE, CAPILLARY: Glucose-Capillary: 60 mg/dL — ABNORMAL LOW (ref 70–99)

## 2012-02-06 MED ORDER — ALTEPLASE 2 MG IJ SOLR
2.0000 mg | Freq: Once | INTRAMUSCULAR | Status: DC | PRN
  Filled 2012-02-06: qty 2

## 2012-02-06 MED ORDER — LEVOFLOXACIN 250 MG PO TABS: 250.0000 mg | ORAL_TABLET | ORAL | Status: DC

## 2012-02-06 MED ORDER — LIDOCAINE HCL (PF) 1 % IJ SOLN: 5.0000 mL | INTRAMUSCULAR | Status: DC | PRN

## 2012-02-06 MED ORDER — PENTAFLUOROPROP-TETRAFLUOROETH EX AERO: 1.0000 "application " | INHALATION_SPRAY | CUTANEOUS | Status: DC | PRN

## 2012-02-06 MED ORDER — HEPARIN SODIUM (PORCINE) 1000 UNIT/ML DIALYSIS: 1000.0000 [IU] | INTRAMUSCULAR | Status: DC | PRN

## 2012-02-06 MED ORDER — SODIUM CHLORIDE 0.9 % IV SOLN: 100.0000 mL | INTRAVENOUS | Status: DC | PRN

## 2012-02-06 MED ORDER — LEVOFLOXACIN 250 MG PO TABS
250.0000 mg | ORAL_TABLET | ORAL | Status: DC
  Administered 2012-02-06: 250 mg via ORAL
  Filled 2012-02-06: qty 1

## 2012-02-06 MED ORDER — LIDOCAINE-PRILOCAINE 2.5-2.5 % EX CREA: 1.0000 "application " | TOPICAL_CREAM | CUTANEOUS | Status: DC | PRN

## 2012-02-06 MED ORDER — HEPARIN SODIUM (PORCINE) 1000 UNIT/ML DIALYSIS: 3400.0000 [IU] | INTRAMUSCULAR | Status: DC | PRN

## 2012-02-06 MED ORDER — SIMVASTATIN 40 MG PO TABS: 40.0000 mg | ORAL_TABLET | Freq: Every day | ORAL | Status: DC

## 2012-02-06 MED ORDER — GLIMEPIRIDE 4 MG PO TABS: 4.0000 mg | ORAL_TABLET | Freq: Every day | ORAL | Status: DC

## 2012-02-06 MED ORDER — RENA-VITE PO TABS: 1.0000 | ORAL_TABLET | Freq: Every day | ORAL | Status: DC

## 2012-02-06 MED ORDER — OXYCODONE-ACETAMINOPHEN 5-325 MG PO TABS: 1.0000 | ORAL_TABLET | Freq: Four times a day (QID) | ORAL | Status: DC | PRN

## 2012-02-06 MED ORDER — ALLOPURINOL 300 MG PO TABS: 150.0000 mg | ORAL_TABLET | Freq: Every day | ORAL | Status: AC

## 2012-02-06 MED ORDER — HYDROCORTISONE ACE-PRAMOXINE 1-1 % RE FOAM: 1.0000 | Freq: Two times a day (BID) | RECTAL | Status: DC

## 2012-02-06 MED ORDER — NEPRO/CARBSTEADY PO LIQD
237.0000 mL | ORAL | Status: DC | PRN
  Filled 2012-02-06: qty 237

## 2012-02-06 MED ORDER — DOXERCALCIFEROL 4 MCG/2ML IV SOLN
INTRAVENOUS | Status: AC
  Administered 2012-02-06: 4 ug via INTRAVENOUS
  Filled 2012-02-06: qty 2

## 2012-02-06 MED ORDER — ASPIRIN EC 81 MG PO TBEC: 81.0000 mg | DELAYED_RELEASE_TABLET | Freq: Every day | ORAL | Status: AC

## 2012-02-06 MED ORDER — METOPROLOL TARTRATE 12.5 MG HALF TABLET: 12.5000 mg | ORAL_TABLET | Freq: Two times a day (BID) | ORAL | Status: DC

## 2012-02-06 NOTE — Plan of Care (Signed)
Problem: RH SKIN INTEGRITY Goal: RH STG ABLE TO PERFORM INCISION/WOUND CARE W/ASSISTANCE STG Able To Perform Incision/Wound Care With Assistance. Max A  Outcome: Not Met (add Reason) Patient will requires total assist for the dressing change until surgery

## 2012-02-06 NOTE — Patient Care Conference (Signed)
Inpatient RehabilitationTeam Conference and Plan of Care Update Date: 02/05/2012   Time: 2:05 PM    Patient Name: Alan [name]      Medical Record Number: [id number]  Date of Birth: 1931-12-26 Sex: Male         Room/Bed: 4007/4007-01 Payor Info: Payor: MEDICARE RAILROAD  Plan: MEDICARE RAILROAD  Product Type: *No Product type*     Admitting Diagnosis: TOE AMPUTATION HX L BKA  Admit Date/Time:  01/28/2012  2:50 PM Admission Comments: No comment available   Primary Diagnosis:  Hx of BKA Principal Problem: Hx of BKA  Patient Active Problem List   Diagnosis Date Noted  . Hx of BKA 01/28/2012  . Pre-operative cardiovascular examination 01/24/2012  . SIRS (systemic inflammatory response syndrome) 01/22/2012  . Wound infection 01/22/2012  . Unilateral complete BKA 01/22/2012  . Atrial fibrillation 01/22/2012  . Aftercare following surgery of the circulatory system, NEC 11/26/2011  . Atherosclerosis of native arteries of the extremities with ulceration(440.23) 06/18/2011  . Diabetes mellitus 05/04/2011  . Chronic anticoagulation 05/04/2011  . Hypotension 05/04/2011  . Sepsis 05/03/2011  . Toxic metabolic encephalopathy 05/03/2011  . Aortic stenosis 11/22/2010  . CAD (coronary artery disease) 11/22/2010  . End stage renal disease on dialysis 11/22/2010  . Encounter for long-term (current) use of anticoagulants 11/16/2010    Expected Discharge Date: Expected Discharge Date: 02/08/12  Team Members Present: Physician leading conference: Dr. Faith Rogue Social Worker Present: Amada Jupiter, LCSW Nurse Present: Daryll Brod, RN PT Present: [name], PT;Other (comment) Corinna Capra., PT) OT Present: Mackie Pai, OT;Patricia Mat Carne, OT Other (Discipline and Name): Tora Duck, PPS Coordinator     Current Status/Progress Goal Weekly Team Focus  Medical   necrotic foot, may need surgical revision  see prior  finalize surgical plan   Bowel/Bladder   Continent of bowel. LBM  02/03/12. HD-T, Th, Sat  Pt to remain continent of bowel      Swallow/Nutrition/ Hydration             ADL's   overall supervision except mod assist for LB dressing, toileting & toilet transfers  overall supervision, no shower transfer goal  D/C planning, toileting, toilet transfer. LB dressing   Mobility   min A to S for transfers  S w/c level; min A car  fam ed, uneven transfers, endurance/activity tolerance   Communication             Safety/Cognition/ Behavioral Observations            Pain   No c/o pain  <3  Offer pain medication 1 hr piror to initial therapy session   Skin   Diabetic ulcer to R ankle with daily dressing change. 2nd R toe amputated requiring W/D dressing tid  No additional skin breakdown  Routine turn q 2hrs      *See Interdisciplinary Assessment and Plan and progress notes for long and short-term goals  Barriers to Discharge: necrotic foot and surgical plan.    Possible Resolutions to Barriers:  surgery    Discharge Planning/Teaching Needs:  Plan is home with wife and step-son to provide needed assistance      Team Discussion:  Medical issues with right foot - anticipate further amputation needed.  Approaching supervision goals. Pt continues to be resistant to therapies at times.  Revisions to Treatment Plan:  Likely to have further amputation.   Continued Need for Acute Rehabilitation Level of Care: The patient requires daily medical management by a physician with specialized training  in physical medicine and rehabilitation for the following conditions: Daily direction of a multidisciplinary physical rehabilitation program to ensure safe treatment while eliciting the highest outcome that is of practical value to the patient.: Yes Daily medical management of patient stability for increased activity during participation in an intensive rehabilitation regime.: Yes Daily analysis of laboratory values and/or radiology reports with any subsequent need for  medication adjustment of medical intervention for : Post surgical problems (renal)  Alan [name] 02/06/2012, 2:19 PM

## 2012-02-06 NOTE — Procedures (Signed)
I was present at this dialysis session. I have reviewed the session itself and made appropriate changes.  Doing well, 4 kg off with HD today. Likely discharge today after HD.  Vinson Moselle, MD [employer] 02/06/2012, 11:35 AM

## 2012-02-06 NOTE — Discharge Summary (Signed)
NAMEHONG, MORING            ACCOUNT NO.:  [id number]  MEDICAL RECORD NO.:  [id number]  LOCATION:  4007                         FACILITY:  [facility]  PHYSICIAN:  Ranelle Oyster, M.D.DATE OF BIRTH:  05-17-1931  DATE OF ADMISSION:  01/28/2012 DATE OF DISCHARGE:  02/06/2012                              DISCHARGE SUMMARY   DISCHARGE DIAGNOSES: 1. Right second toe amputation, January 25, 2012, with gangrenous     changes. 2. History of left below-knee amputation. 3. Chronic Coumadin therapy for atrial fibrillation and pain     management. 4. End-stage renal disease with hemodialysis. 5. Diabetes mellitus with peripheral neuropathy. 6. Chronic anemia. 7. Gout. 8. Hypertension. 9. Hyperlipidemia.  HISTORY:  This is a 79 year old right-handed male, history of end-stage renal disease with hemodialysis, atrial fibrillation with chronic Coumadin therapy, left below-knee amputation on July of 2013, received inpatient rehab services for and recently received his prosthesis from [employer] with limited prosthetic teaching thus far.  Admitted on January 22, 2012, with chronic wound over his right second toe with gangrenous changes.  No change with conservative care and underwent open right ray amputation, second toe, on January 25, 2012, per Dr. Edilia Bo, fitted with postoperative shoe.  Advised partial weightbearing to the heel only.  Postoperative pain management.  Hemodialysis ongoing per [facility].  Chronic Coumadin resumed for atrial fibrillation.  Physical and occupational therapy, ongoing.  The patient was admitted for comprehensive rehab program.  PAST MEDICAL HISTORY:  See discharge diagnoses.  SOCIAL HISTORY:  Lives with his wife.  Ramp to entrance to the home.  FUNCTIONAL HISTORY PRIOR TO ADMISSION:  No driving, retired.  Used his wheelchair and limited use of prosthesis.  FUNCTIONAL STATUS UPON ADMISSION TO REHAB SERVICES:  Minimal assist for scooting to the edge of  the bed, +2 total assist for stand pivot transfers.  PHYSICAL EXAMINATION:  VITAL SIGNS:  Blood pressure 100/64, pulse 107, temperature 98, respirations 18. GENERAL:  This was an alert male, in no acute distress.  He was appropriate for name, date of birth and age, followed simple commands. LUNGS:  Clear to auscultation. CARDIAC:  Rate controlled. ABDOMEN:  Soft, nontender.  Good bowel sounds. EXTREMITIES:  Left below-knee amputation site was well healed. Prosthesis appeared to be fitting appropriately.  Recent right foot surgery, dressing in place.  Modest serous sanguinous drainage.  REHABILITATION HOSPITAL COURSE:  The patient was admitted to Inpatient Rehab Services with therapies initiated on a 3-hour daily basis consisting of physical therapy, occupational therapy, and 24-hour rehabilitation nursing.  The following issues were addressed during the patient's rehabilitation stay.  Pertaining to Mr. Reppert right second toe amputation on January 25, 2012, the patient continued to have serosanguineous drainage.  He had a Darco shoe in place, weightbearing to the heel only.  Followup vascular surgery for increasing serosanguineous drainage, placed on antibiotic therapy.  There was questionable need for further amputation.  He was changed to nonweightbearing to protect the wound, close monitoring of white cell count of 14,000.  The patient had requested possibly discharge to home to handle some personal affairs and follow up in the office of Vascular Surgery for consideration of ongoing surgical care.  He remained on Percocet  for pain management.  Chronic Coumadin ongoing for atrial fibrillation; however, this would need to be discontinued if there was further plans for surgical intervention.  Hemodialysis ongoing per [facility]al Services was contacted of the patient's upcoming plan for discharge.  He did have a history of diabetes mellitus with peripheral neuropathy, hemoglobin A1c of  6.3.  Chronic anemia, maintained on Aranesp with his hemodialysis.  Blood pressures remained well controlled.  The patient received weekly collaborative interdisciplinary team conferences to discuss estimated length of stay, family teaching, and any barriers to his discharge.  He was continent of bowel and bladder, minimal assist upper body, total assist lower body activities of daily living. Moderate assist overall for his functional mobility.  The patient with limited gains, anticipation for further surgery after recent right second toe amputation.  Plan is to be discharged to home after family teaching completed with followup in the Vascular Surgery office for ongoing discussion of possible amputation.  DISCHARGE MEDICATIONS:  At the time of dictation included: 1. Percocet 5-325 mg 1 tablet every 6 hours as needed for pain. 2. Allopurinol 150 mg p.o. daily. 3. Aspirin 81 mg p.o. daily. 4. Tums 1 tablet t.i.d. 5. Amaryl 4 mg p.o. daily. 6. Lopressor 12.5 mg p.o. b.i.d. 7. Rena-Vite 1 tablet p.o. at bedtime. 8. Zocor 40 mg p.o. at bedtime. 9. Coumadin 2.5 mg daily; however, this may be discontinued if further     plan for surgical intervention.  The patient had been on Zosyn and     vancomycin for wound coverage.  This would be discussed at the time     of discharge for ongoing antibiotic care.  His diet was a diabetic renal diet.  He was advised nonweightbearing to the right foot.  Currently, he was receiving Aquacel and applied to the right outer ankle, 4x4, Kerlix, moistened with normal saline to previous dressing.  SPECIAL INSTRUCTIONS:  The patient would follow up with [name] of Vascular Surgery in relation to further need for surgical intervention of the right foot, continue hemodialysis as advised.  Contacts to be made to [name] in relation to ongoing Coumadin therapy with anticipated need for surgical intervention.     Mariam Dollar,  P.A.   ______________________________ Ranelle Oyster, M.D.    DA/MEDQ  D:  02/05/2012  T:  02/06/2012  Job:  [id number]  cc:   Jorge Ny, MD Maree Krabbe, M.D. Peter M. [place], M.D. Veverly Fells. [place], M.D.

## 2012-02-06 NOTE — Significant Event (Signed)
Hypoglycemic Event  CBG: 60  Treatment: 15 GM carbohydrate snack  Symptoms: None  Follow-up CBG: Time:1105 CBG Result:80  Possible Reasons for Event: Unknown  Comments/MD notified:Dan Angilli PA notified. Amaryl held.    Margurite Duffy, Sylvie Farrier  Remember to initiate Hypoglycemia Order Set & complete

## 2012-02-06 NOTE — Progress Notes (Signed)
Physical Therapy Discharge Summary  Patient Details  Name: Alan [name] MRN: [id number] Date of Birth: 05/22/1931  Today's Date: 02/06/2012  Pt missed 60 min of skilled PT due to off the unit for HD with plan then to d/c home today after HD.   Patient has met 6 of 6 long term goals due to improved activity tolerance and ability to compensate for deficits.  Patient to discharge at a wheelchair level Supervision.   Patient's wife is independent to provide the necessary physical and cognitive assistance at discharge. Pt now with need for further amputation to RLE and pt prefers to d/c home first and then go in for amputation next week (d/c was moved up).   Reasons goals not met: n/a  Recommendation:  Patient will benefit from ongoing skilled PT services in home health setting to continue to advance safe functional mobility, address ongoing impairments in strength, endurance, cognition, prosthetic training and minimize fall risk.  Equipment: No equipment provided. Pt already owns w/c and slide board.  Reasons for discharge: discharge from [name]  Patient/family agrees with progress made and goals achieved: Yes  PT Discharge Precautions/Restrictions Precautions Precautions: Fall Precaution Comments: L prosthesis is new for pt; new order 1/28 for NWB through RLE Required Braces or Orthoses: Other Brace/Splint (Darco shoe R) Restrictions RLE Weight Bearing: Non weight bearing    Cognition Overall Cognitive Status: Impaired at baseline Orientation Level: Oriented X4 Sensation Sensation Light Touch: Impaired by gross assessment Coordination Gross Motor Movements are Fluid and Coordinated: Yes Motor  Motor Motor: Within Functional Limits     Trunk/Postural Assessment  Cervical Assessment Cervical Assessment: Within Functional Limits Thoracic Assessment Thoracic Assessment: Exceptions to Alan [name] (same as admission) Lumbar Assessment Lumbar Assessment: Exceptions to Alan [name]  (same as admission)  Balance Static Sitting Balance Static Sitting - Level of Assistance: 6: Modified independent (Device/Increase time) Dynamic Sitting Balance Dynamic Sitting - Level of Assistance: 6: Modified independent (Device/Increase time) Static Standing Balance Static Standing - Level of Assistance: Not tested (comment) (pt unable to maintain NWB on RLE) Extremity Assessment      RLE Assessment RLE Assessment: Exceptions to Alan [name] (same as admission (ankle NT due to bandage; grossly 4-/5)) LLE Assessment LLE Assessment: Exceptions to Alan [name] (same as admission; L BKA)  See FIM for current functional status  Alan [name] 02/06/2012, 7:40 AM

## 2012-02-06 NOTE — Plan of Care (Signed)
Problem: RH BOWEL ELIMINATION Goal: RH STG MANAGE BOWEL WITH ASSISTANCE STG Manage Bowel with Assistance. Mod I  Outcome: Not Progressing LBM 02-03-12 Goal: RH STG MANAGE BOWEL W/EQUIPMENT W/ASSISTANCE STG Manage Bowel With Equipment With Min Assistance  Outcome: Not Progressing LBM 02-03-12

## 2012-02-06 NOTE — Progress Notes (Signed)
Social Work  Discharge Note  The overall goal for the admission was met for:   Discharge location: Yes - home with wife and step-son to assist  Length of Stay: No - pt insisted on leaving prior to initially targeted d/c date.  Had met supervision goals and wife agreeable with earlier d/c .  Pt insists he wants to d/c home while he awaits return next week for probable BKA.  Discharge activity level: Yes - supervision  Home/community participation: Yes  Services provided included: MD, RD, PT, OT, RN, TR, Pharmacy and SW  Financial Services: Medicare  Follow-up services arranged: Home Health: RN, PT, OT via [facility] and Patient/Family has no preference for HH/DME agencies  Comments (or additional information): wife instructed on dressing changes to foot and ankle - HHRN to continue teaching  Patient/Family verbalized understanding of follow-up arrangements: Yes  Individual responsible for coordination of the follow-up plan: patient  Confirmed correct DME delivered: NA    Alan [name]

## 2012-02-06 NOTE — Progress Notes (Signed)
Subjective/Complaints: Up for early hd  A 12 point review of systems has been performed and if not noted above is otherwise negative.   Objective: Vital Signs: Blood pressure 114/48, pulse 62, temperature 97.3 F (36.3 C), temperature source Oral, resp. rate 18, weight 107.9 kg (237 lb 14 oz), SpO2 99.00%. No results found.  Basename 02/06/12 0717  WBC 7.7  HGB 9.9*  HCT 31.6*  PLT 331    Basename 02/06/12 0717  NA PENDING  K PENDING  CL PENDING  GLUCOSE PENDING  BUN PENDING  CREATININE PENDING  CALCIUM 8.7   CBG (last 3)   Basename 02/05/12 2054 02/05/12 1655 02/05/12 1110  GLUCAP 150* 174* 112*    Wt Readings from Last 3 Encounters:  02/06/12 107.9 kg (237 lb 14 oz)  01/27/12 103.692 kg (228 lb 9.6 oz)  01/27/12 103.692 kg (228 lb 9.6 oz)    Physical Exam:  HENT: mucosa pink  Head: Normocephalic.  Eyes:  Pupils round and reactive to light  Neck: Neck supple. No thyromegaly present.  Cardiovascular:  Cardiac rate controlled, with systolic murmur, no rub.  Pulmonary/Chest: Effort normal and breath sounds normal. No respiratory distress. No wheezes  Abdominal: Soft. Bowel sounds are normal. Non-distended Neurological:oriented today. Good sitting balance Able to follow simple commands. UE's 4/5. LLE 3/5 HF, RLE grossly 2+ to 3/5 with some limitations due to pain.  Skin:  Left BKA is well healed. dry dressing in place over right 2nd toe with minimal serosanginous dc, margin and MT area is soft, dark, ischemic, has persistent drainage.  Psychiatric:  Very flat,     Assessment/Plan: 1. Functional deficits secondary to right second toe amputation with a history of left BKA (pt just fitted with a prosthesis) which require 3+ hours per day of interdisciplinary therapy in a comprehensive inpatient rehab setting. Physiatrist is providing close team supervision and 24 hour management of active medical problems listed below. Physiatrist and rehab team continue to  assess barriers to discharge/monitor patient progress toward functional and medical goals.  Home today. Will follow up with vascular surgery regarding future amputation. Continue current wound care. hhrn can see  FIM: FIM - Bathing Bathing Steps Patient Completed: Chest;Right Arm;Left Arm;Abdomen;Front perineal area;Right upper leg;Left upper leg;Buttocks Bathing: 5: Set-up assist to: Open items  FIM - Upper Body Dressing/Undressing Upper body dressing/undressing steps patient completed: Thread/unthread right sleeve of pullover shirt/dresss;Thread/unthread left sleeve of pullover shirt/dress;Put head through opening of pull over shirt/dress;Pull shirt over trunk Upper body dressing/undressing: 5: Set-up assist to: Obtain clothing/put away FIM - Lower Body Dressing/Undressing Lower body dressing/undressing steps patient completed: Thread/unthread right pants leg;Thread/unthread left pants leg Lower body dressing/undressing: 3: Mod-Patient completed 50-74% of tasks  FIM - Toileting Toileting steps completed by patient: Performs perineal hygiene Toileting: 2: Max-Patient completed 1 of 3 steps  FIM - Diplomatic Services operational officer Devices: Grab bars Toilet Transfers: 3-From toilet/BSC: Mod A (lift or lower assist);3-To toilet/BSC: Mod A (lift or lower assist)  FIM - Press photographer Assistive Devices: Arm rests Bed/Chair Transfer: 5: Bed > Chair or W/C: Supervision (verbal cues/safety issues);5: Chair or W/C > Bed: Supervision (verbal cues/safety issues)  FIM - Locomotion: Wheelchair Locomotion: Wheelchair: 5: Travels 150 ft or more: maneuvers on rugs and over door sills with supervision, cueing or coaxing FIM - Locomotion: Ambulation Locomotion: Ambulation: 0: Activity did not occur  Comprehension Comprehension Mode: Auditory Comprehension: 5-Follows basic conversation/direction: With extra time/assistive device  Expression Expression Mode:  Verbal Expression: 5-Expresses complex  90% of the time/cues < 10% of the time  Social Interaction Social Interaction: 5-Interacts appropriately 90% of the time - Needs monitoring or encouragement for participation or interaction.  Problem Solving Problem Solving: 5-Solves basic 90% of the time/requires cueing < 10% of the time  Memory Memory: 5-Recognizes or recalls 90% of the time/requires cueing < 10% of the time  Medical Problem List and Plan:  1. Right second toe amputation 01/25/2012 secondary to gangrenous changes/history of left BKA and recent receive prosthesis. NWB RLE  -follow up with [facility] for mgt of foot 2. DVT Prophylaxis/Anticoagulation: Chronic Coumadin therapy for atrial fibrillation. Continue heparin therapy until INR therapeutic. Monitor for any bleeding episodes  3. Pain Management: Percocet dose adjusted down and he's been more alert 4. Neuropsych: This patient is not capable of making decisions on his/her own behalf.  5. End stage renal disease/hemodialysis. Followup per renal services.    6. Diabetes mellitus with peripheral neuropathy. Latest hemoglobin A1c of 6.3. Continue sliding scale insulin and check blood sugars a.c. and at bedtime. Patient on Amaryll 4 mg daily prior to admission--- increased to 4mg  daily  -outpt follow up 7. Chronic anemia. Aranesp as directed as well as ferric gluconate.   Followup labs with each dialysis  8. Gout. Zyloprim. Monitor for any signs of flareup  9. Hypertension/atrial fibrillation. Lopressor 12.5 mg twice a day. Monitor with increased activity  10. Hyperlipidemia. Zocor  11. Wound care:surgery assessing for further rx of foot. May need further amputation  -continue current dressing and NWB 12. Leukocytosis: wbc's remain around 14k 13. Confusion: has waxed and waned  -early dementia likely LOS (Days) 9 A FACE TO FACE EVALUATION WAS PERFORMED  SWARTZ,ZACHARY T 02/06/2012 8:31 AM

## 2012-02-06 NOTE — Progress Notes (Signed)
Occupational Therapy Discharge Summary  Patient Details  Name: Alan [name] MRN: [id number] Date of Birth: 07/20/1931  Today's Date: 02/06/2012  Patient has met 5 of 8 long term goals due to improved activity tolerance, improved balance, postural control, ability to compensate for deficits, improved attention, improved awareness and improved coordination.  Patient to discharge at overall supervision except mod assist for toileting & toilet transfers and LB dressing.  Patient's care partner is independent to provide the necessary supervision, but needs assistance to provide mod assist level assistance at discharge.    Reasons goals not met: Patient did not meet supervision LB dressing goal, supervision toilet transfer goal, or supervision toileting goal. Patient currently requires mod assist for LB dressing, toileting, and toilet transfers. Patient is discharging earlier than expected secondary to possible additional amputation to RLE. Patient has requested to go home prior to having surgery on RLE.   Recommendation:  Patient will benefit from ongoing skilled OT services in home health setting to continue to advance functional skills in the area of BADL, iADL and Reduce care partner burden.  Equipment: No equipment provided, patient has needed equipment   Reasons for discharge: treatment goals met and discharge from hospital  Patient/family agrees with progress made and goals achieved: Yes  Precautions/Restrictions  Restrictions Weight Bearing Restrictions: Yes RLE Weight Bearing: Non weight bearing RLE Partial Weight Bearing Percentage or Pounds: PWB through heel with darco boot on  Vital Signs Therapy Vitals Temp: 97.7 F (36.5 C) Temp src: Oral Pulse Rate: 73  Resp: 20  BP: 116/52 mmHg Patient Position, if appropriate: Lying Oxygen Therapy SpO2: 98 % O2 Device: None (Room air)  Pain Pain Assessment Pain Assessment: No/denies pain Pain Score: 0-No pain  ADL - See  FIM  Vision/Perception  Vision - History Baseline Vision: Wears glasses only for reading Patient Visual Report: No change from baseline Perception Perception: Within Functional Limits Praxis Praxis: Intact   Cognition Orientation Level: Oriented X4  Sensation Sensation Additional Comments: bilateral UEs appear intact Coordination Gross Motor Movements are Fluid and Coordinated: Yes Fine Motor Movements are Fluid and Coordinated: Yes  Motor - See Discharge Navigator  Mobility  - See Discharge Navigator  Trunk/Postural Assessment  - See Discharge Navigator  Balance - See Discharge Navigator  Extremity/Trunk Assessment RUE Assessment RUE Assessment: Within Functional Limits LUE Assessment LUE Assessment: Within Functional Limits  See FIM for current functional status  [name] 02/06/2012, 12:04 PM

## 2012-02-06 NOTE — Progress Notes (Signed)
Patient discharged with all belongings with family to home and D/C instructions given via Harvel Ricks PA at approximately 1315. Went over instructions with patient. Patient and family denied and questions. Demonstrated with wife how to wrap and do dressing change. Wife reported understanding. Wife reported she had to change the dressing on the other leg before. Patient alert and oriented x3. Vitals stable. No complaints noted.

## 2012-02-07 ENCOUNTER — Other Ambulatory Visit: Payer: Self-pay | Admitting: *Deleted

## 2012-02-11 ENCOUNTER — Ambulatory Visit: Payer: Self-pay | Admitting: Internal Medicine

## 2012-02-11 ENCOUNTER — Encounter (HOSPITAL_COMMUNITY): Payer: Self-pay | Admitting: Pharmacy Technician

## 2012-02-11 DIAGNOSIS — Z7901 Long term (current) use of anticoagulants: Secondary | ICD-10-CM

## 2012-02-14 ENCOUNTER — Encounter (HOSPITAL_COMMUNITY): Payer: Self-pay | Admitting: *Deleted

## 2012-02-14 MED ORDER — VANCOMYCIN HCL 10 G IV SOLR
1500.0000 mg | INTRAVENOUS | Status: DC
  Filled 2012-02-14: qty 1500

## 2012-02-14 MED ORDER — SODIUM CHLORIDE 0.9 % IV SOLN
INTRAVENOUS | Status: DC
  Administered 2012-02-15: 13:00:00 via INTRAVENOUS

## 2012-02-15 ENCOUNTER — Inpatient Hospital Stay (HOSPITAL_COMMUNITY): Payer: MEDICARE | Admitting: Anesthesiology

## 2012-02-15 ENCOUNTER — Telehealth: Payer: Self-pay | Admitting: *Deleted

## 2012-02-15 ENCOUNTER — Encounter (HOSPITAL_COMMUNITY): Payer: Self-pay | Admitting: Anesthesiology

## 2012-02-15 ENCOUNTER — Encounter (HOSPITAL_COMMUNITY)
Admission: RE | Disposition: A | Discharge status: Skilled Nursing Facility | Payer: Self-pay | Source: Ambulatory Visit | Attending: Surgery

## 2012-02-15 ENCOUNTER — Inpatient Hospital Stay (HOSPITAL_COMMUNITY)
Admission: RE | Admit: 2012-02-15 | Discharge: 2012-02-20 | DRG: 239 | Disposition: A | Discharge status: Skilled Nursing Facility | Payer: MEDICARE | Source: Ambulatory Visit | Attending: Surgery | Admitting: Surgery

## 2012-02-15 DIAGNOSIS — N186 End stage renal disease: Secondary | ICD-10-CM | POA: Diagnosis present

## 2012-02-15 DIAGNOSIS — I70269 Atherosclerosis of native arteries of extremities with gangrene, unspecified extremity: Secondary | ICD-10-CM

## 2012-02-15 DIAGNOSIS — Z951 Presence of aortocoronary bypass graft: Secondary | ICD-10-CM

## 2012-02-15 DIAGNOSIS — I2510 Atherosclerotic heart disease of native coronary artery without angina pectoris: Secondary | ICD-10-CM | POA: Diagnosis present

## 2012-02-15 DIAGNOSIS — Z7901 Long term (current) use of anticoagulants: Secondary | ICD-10-CM

## 2012-02-15 DIAGNOSIS — Z87891 Personal history of nicotine dependence: Secondary | ICD-10-CM

## 2012-02-15 DIAGNOSIS — Z992 Dependence on renal dialysis: Secondary | ICD-10-CM

## 2012-02-15 DIAGNOSIS — D638 Anemia in other chronic diseases classified elsewhere: Secondary | ICD-10-CM | POA: Diagnosis present

## 2012-02-15 DIAGNOSIS — D62 Acute posthemorrhagic anemia: Secondary | ICD-10-CM | POA: Diagnosis not present

## 2012-02-15 DIAGNOSIS — I4891 Unspecified atrial fibrillation: Secondary | ICD-10-CM | POA: Diagnosis present

## 2012-02-15 DIAGNOSIS — N2581 Secondary hyperparathyroidism of renal origin: Secondary | ICD-10-CM | POA: Diagnosis present

## 2012-02-15 DIAGNOSIS — E785 Hyperlipidemia, unspecified: Secondary | ICD-10-CM | POA: Diagnosis present

## 2012-02-15 DIAGNOSIS — I359 Nonrheumatic aortic valve disorder, unspecified: Secondary | ICD-10-CM | POA: Diagnosis present

## 2012-02-15 DIAGNOSIS — S88119A Complete traumatic amputation at level between knee and ankle, unspecified lower leg, initial encounter: Secondary | ICD-10-CM

## 2012-02-15 DIAGNOSIS — Z7982 Long term (current) use of aspirin: Secondary | ICD-10-CM

## 2012-02-15 DIAGNOSIS — Z8546 Personal history of malignant neoplasm of prostate: Secondary | ICD-10-CM

## 2012-02-15 DIAGNOSIS — T148XXA Other injury of unspecified body region, initial encounter: Secondary | ICD-10-CM

## 2012-02-15 DIAGNOSIS — I120 Hypertensive chronic kidney disease with stage 5 chronic kidney disease or end stage renal disease: Secondary | ICD-10-CM | POA: Diagnosis present

## 2012-02-15 DIAGNOSIS — Z79899 Other long term (current) drug therapy: Secondary | ICD-10-CM

## 2012-02-15 HISTORY — PX: TRANSMETATARSAL AMPUTATION: SHX6197

## 2012-02-15 LAB — CBC
MCH: 29.7 pg (ref 26.0–34.0)
Platelets: 235 10*3/uL (ref 150–400)
RBC: 4.01 MIL/uL — ABNORMAL LOW (ref 4.22–5.81)
WBC: 9 10*3/uL (ref 4.0–10.5)

## 2012-02-15 LAB — GLUCOSE, CAPILLARY
Glucose-Capillary: 84 mg/dL (ref 70–99)
Glucose-Capillary: 91 mg/dL (ref 70–99)
Glucose-Capillary: 75 mg/dL (ref 70–99)
Glucose-Capillary: 88 mg/dL (ref 70–99)
Glucose-Capillary: 188 mg/dL — ABNORMAL HIGH (ref 70–99)

## 2012-02-15 LAB — COMPREHENSIVE METABOLIC PANEL
ALT: 12 U/L (ref 0–53)
AST: 20 U/L (ref 0–37)
Albumin: 3 g/dL — ABNORMAL LOW (ref 3.5–5.2)
CO2: 33 mEq/L — ABNORMAL HIGH (ref 19–32)
Calcium: 9.1 mg/dL (ref 8.4–10.5)
GFR calc non Af Amer: 10 mL/min — ABNORMAL LOW (ref >90)
Sodium: 139 mEq/L (ref 135–145)

## 2012-02-15 SURGERY — AMPUTATION, FOOT, TRANSMETATARSAL
Anesthesia: Monitor Anesthesia Care | Site: Foot | Laterality: Right | Wound class: Contaminated

## 2012-02-15 MED ORDER — HYDRALAZINE HCL 20 MG/ML IJ SOLN
10.0000 mg | INTRAMUSCULAR | Status: DC | PRN
  Filled 2012-02-15: qty 0.5

## 2012-02-15 MED ORDER — 0.9 % SODIUM CHLORIDE (POUR BTL) OPTIME
TOPICAL | Status: DC | PRN
  Administered 2012-02-15: 1000 mL

## 2012-02-15 MED ORDER — ALTEPLASE 2 MG IJ SOLR
2.0000 mg | Freq: Once | INTRAMUSCULAR | Status: AC | PRN
  Filled 2012-02-15: qty 2

## 2012-02-15 MED ORDER — DOCUSATE SODIUM 100 MG PO CAPS
100.0000 mg | ORAL_CAPSULE | Freq: Every day | ORAL | Status: DC
  Administered 2012-02-17 – 2012-02-20 (×4): 100 mg via ORAL
  Filled 2012-02-15 (×5): qty 1

## 2012-02-15 MED ORDER — SODIUM CHLORIDE 0.9 % IV SOLN
INTRAVENOUS | Status: DC | PRN
  Administered 2012-02-15: 13:00:00 via INTRAVENOUS

## 2012-02-15 MED ORDER — INSULIN ASPART 100 UNIT/ML ~~LOC~~ SOLN
0.0000 [IU] | Freq: Three times a day (TID) | SUBCUTANEOUS | Status: DC
  Administered 2012-02-16: 3 [IU] via SUBCUTANEOUS
  Administered 2012-02-16: 2 [IU] via SUBCUTANEOUS
  Administered 2012-02-17 – 2012-02-18 (×3): 3 [IU] via SUBCUTANEOUS
  Administered 2012-02-19: 2 [IU] via SUBCUTANEOUS

## 2012-02-15 MED ORDER — PHENOL 1.4 % MT LIQD
1.0000 | OROMUCOSAL | Status: DC | PRN
  Filled 2012-02-15: qty 177

## 2012-02-15 MED ORDER — SIMVASTATIN 40 MG PO TABS
40.0000 mg | ORAL_TABLET | Freq: Every day | ORAL | Status: DC
  Administered 2012-02-15 – 2012-02-19 (×5): 40 mg via ORAL
  Filled 2012-02-15 (×6): qty 1

## 2012-02-15 MED ORDER — MORPHINE SULFATE 2 MG/ML IJ SOLN: 2.0000 mg | INTRAMUSCULAR | Status: DC | PRN

## 2012-02-15 MED ORDER — LIDOCAINE-PRILOCAINE 2.5-2.5 % EX CREA
1.0000 "application " | TOPICAL_CREAM | CUTANEOUS | Status: DC | PRN
  Filled 2012-02-15: qty 5

## 2012-02-15 MED ORDER — ASPIRIN EC 81 MG PO TBEC
81.0000 mg | DELAYED_RELEASE_TABLET | Freq: Every day | ORAL | Status: DC
  Administered 2012-02-17 – 2012-02-20 (×4): 81 mg via ORAL
  Filled 2012-02-15 (×5): qty 1

## 2012-02-15 MED ORDER — PENTAFLUOROPROP-TETRAFLUOROETH EX AERO: 1.0000 "application " | INHALATION_SPRAY | CUTANEOUS | Status: DC | PRN

## 2012-02-15 MED ORDER — DOXERCALCIFEROL 2.5 MCG PO CAPS
4.0000 ug | ORAL_CAPSULE | ORAL | Status: DC
  Administered 2012-02-19: 4 ug via ORAL
  Filled 2012-02-15 (×2): qty 3

## 2012-02-15 MED ORDER — ONDANSETRON HCL 4 MG/2ML IJ SOLN: 4.0000 mg | Freq: Four times a day (QID) | INTRAMUSCULAR | Status: DC | PRN

## 2012-02-15 MED ORDER — PROMETHAZINE HCL 25 MG/ML IJ SOLN: 6.2500 mg | INTRAMUSCULAR | Status: DC | PRN

## 2012-02-15 MED ORDER — FENTANYL CITRATE 0.05 MG/ML IJ SOLN
INTRAMUSCULAR | Status: DC | PRN
  Administered 2012-02-15 (×2): 25 ug via INTRAVENOUS

## 2012-02-15 MED ORDER — POTASSIUM CHLORIDE CRYS ER 20 MEQ PO TBCR: 20.0000 meq | EXTENDED_RELEASE_TABLET | Freq: Once | ORAL | Status: AC | PRN

## 2012-02-15 MED ORDER — OXYCODONE-ACETAMINOPHEN 5-325 MG PO TABS
1.0000 | ORAL_TABLET | ORAL | Status: DC | PRN
  Administered 2012-02-15 – 2012-02-16 (×2): 2 via ORAL
  Administered 2012-02-16 – 2012-02-17 (×2): 1 via ORAL
  Filled 2012-02-15 (×2): qty 1
  Filled 2012-02-15 (×2): qty 2

## 2012-02-15 MED ORDER — VANCOMYCIN HCL IN DEXTROSE 1-5 GM/200ML-% IV SOLN
1000.0000 mg | INTRAVENOUS | Status: DC
Start: 2012-02-16 — End: 2012-02-20
  Administered 2012-02-15 – 2012-02-19 (×3): 1000 mg via INTRAVENOUS
  Filled 2012-02-15 (×4): qty 200

## 2012-02-15 MED ORDER — LIDOCAINE HCL (CARDIAC) 20 MG/ML IV SOLN
INTRAVENOUS | Status: DC | PRN
  Administered 2012-02-15: 100 mg via INTRAVENOUS

## 2012-02-15 MED ORDER — GUAIFENESIN-DM 100-10 MG/5ML PO SYRP: 15.0000 mL | ORAL_SOLUTION | ORAL | Status: DC | PRN

## 2012-02-15 MED ORDER — METOPROLOL TARTRATE 1 MG/ML IV SOLN: 2.0000 mg | INTRAVENOUS | Status: DC | PRN

## 2012-02-15 MED ORDER — VANCOMYCIN HCL IN DEXTROSE 1-5 GM/200ML-% IV SOLN
INTRAVENOUS | Status: AC
  Administered 2012-02-15: 1000 mg via INTRAVENOUS
  Filled 2012-02-15: qty 200

## 2012-02-15 MED ORDER — ACETAMINOPHEN 325 MG PO TABS: 325.0000 mg | ORAL_TABLET | ORAL | Status: DC | PRN

## 2012-02-15 MED ORDER — OXYCODONE HCL 5 MG/5ML PO SOLN: 5.0000 mg | Freq: Once | ORAL | Status: AC | PRN

## 2012-02-15 MED ORDER — ONDANSETRON HCL 4 MG/2ML IJ SOLN
INTRAMUSCULAR | Status: DC | PRN
  Administered 2012-02-15: 4 mg via INTRAVENOUS

## 2012-02-15 MED ORDER — BACITRACIN ZINC 500 UNIT/GM EX OINT
TOPICAL_OINTMENT | CUTANEOUS | Status: AC
  Filled 2012-02-15: qty 15

## 2012-02-15 MED ORDER — MEPERIDINE HCL 25 MG/ML IJ SOLN: 6.2500 mg | INTRAMUSCULAR | Status: DC | PRN

## 2012-02-15 MED ORDER — HYDROMORPHONE HCL PF 1 MG/ML IJ SOLN: 0.2500 mg | INTRAMUSCULAR | Status: DC | PRN

## 2012-02-15 MED ORDER — ACETAMINOPHEN 10 MG/ML IV SOLN
1000.0000 mg | Freq: Once | INTRAVENOUS | Status: AC | PRN
  Filled 2012-02-15: qty 100

## 2012-02-15 MED ORDER — SODIUM CHLORIDE 0.9 % IV SOLN: 250.0000 mL | INTRAVENOUS | Status: DC | PRN

## 2012-02-15 MED ORDER — SODIUM CHLORIDE 0.9 % IV SOLN: 100.0000 mL | INTRAVENOUS | Status: DC | PRN

## 2012-02-15 MED ORDER — LIDOCAINE HCL (PF) 1 % IJ SOLN: 5.0000 mL | INTRAMUSCULAR | Status: DC | PRN

## 2012-02-15 MED ORDER — DARBEPOETIN ALFA-POLYSORBATE 40 MCG/0.4ML IJ SOLN
40.0000 ug | INTRAMUSCULAR | Status: DC
  Filled 2012-02-15: qty 0.4

## 2012-02-15 MED ORDER — CALCIUM CARBONATE ANTACID 500 MG PO CHEW
1.0000 | CHEWABLE_TABLET | Freq: Three times a day (TID) | ORAL | Status: DC
  Administered 2012-02-16 – 2012-02-19 (×9): 200 mg via ORAL
  Filled 2012-02-15 (×16): qty 1

## 2012-02-15 MED ORDER — ALLOPURINOL 150 MG HALF TABLET
150.0000 mg | ORAL_TABLET | Freq: Every day | ORAL | Status: DC
  Administered 2012-02-17 – 2012-02-20 (×4): 150 mg via ORAL
  Filled 2012-02-15 (×5): qty 1

## 2012-02-15 MED ORDER — SODIUM CHLORIDE 0.9 % IJ SOLN: 3.0000 mL | INTRAMUSCULAR | Status: DC | PRN

## 2012-02-15 MED ORDER — LACTATED RINGERS IV SOLN: INTRAVENOUS | Status: DC

## 2012-02-15 MED ORDER — ACETAMINOPHEN 650 MG RE SUPP: 325.0000 mg | RECTAL | Status: DC | PRN

## 2012-02-15 MED ORDER — OXYCODONE HCL 5 MG PO TABS: 5.0000 mg | ORAL_TABLET | Freq: Once | ORAL | Status: AC | PRN

## 2012-02-15 MED ORDER — METOPROLOL TARTRATE 25 MG PO TABS
37.5000 mg | ORAL_TABLET | Freq: Two times a day (BID) | ORAL | Status: DC
  Administered 2012-02-15: 37.5 mg via ORAL
  Filled 2012-02-15 (×3): qty 1

## 2012-02-15 MED ORDER — BISACODYL 10 MG RE SUPP: 10.0000 mg | Freq: Every day | RECTAL | Status: DC | PRN

## 2012-02-15 MED ORDER — HEPARIN SODIUM (PORCINE) 1000 UNIT/ML DIALYSIS
1000.0000 [IU] | INTRAMUSCULAR | Status: DC | PRN
  Filled 2012-02-15: qty 1

## 2012-02-15 MED ORDER — GLIMEPIRIDE 2 MG PO TABS
2.0000 mg | ORAL_TABLET | Freq: Every day | ORAL | Status: DC
Start: 2012-02-16 — End: 2012-02-20
  Administered 2012-02-16 – 2012-02-19 (×4): 2 mg via ORAL
  Filled 2012-02-15 (×6): qty 1

## 2012-02-15 MED ORDER — SODIUM CHLORIDE 0.9 % IV SOLN
10.0000 mg | INTRAVENOUS | Status: DC | PRN
  Administered 2012-02-15: 20 ug/min via INTRAVENOUS

## 2012-02-15 MED ORDER — SODIUM CHLORIDE 0.9 % IJ SOLN
3.0000 mL | Freq: Two times a day (BID) | INTRAMUSCULAR | Status: DC
  Administered 2012-02-15: 3 mL via INTRAVENOUS

## 2012-02-15 MED ORDER — POLYETHYLENE GLYCOL 3350 17 G PO PACK
17.0000 g | PACK | Freq: Every day | ORAL | Status: DC | PRN
  Filled 2012-02-15: qty 1

## 2012-02-15 MED ORDER — ETOMIDATE 2 MG/ML IV SOLN
INTRAVENOUS | Status: DC | PRN
  Administered 2012-02-15: 16 mg via INTRAVENOUS

## 2012-02-15 MED ORDER — SODIUM CHLORIDE 0.9 % IV SOLN
125.0000 mg | INTRAVENOUS | Status: DC
  Administered 2012-02-16 – 2012-02-19 (×2): 125 mg via INTRAVENOUS
  Filled 2012-02-15 (×4): qty 10

## 2012-02-15 MED ORDER — BACITRACIN ZINC 500 UNIT/GM EX OINT
TOPICAL_OINTMENT | CUTANEOUS | Status: DC | PRN
  Administered 2012-02-15: 1 via TOPICAL

## 2012-02-15 MED ORDER — LABETALOL HCL 5 MG/ML IV SOLN
10.0000 mg | INTRAVENOUS | Status: DC | PRN
  Filled 2012-02-15: qty 4

## 2012-02-15 MED ORDER — VANCOMYCIN HCL IN DEXTROSE 1-5 GM/200ML-% IV SOLN: 1000.0000 mg | Freq: Two times a day (BID) | INTRAVENOUS | Status: DC

## 2012-02-15 MED ORDER — NEPRO/CARBSTEADY PO LIQD
237.0000 mL | ORAL | Status: DC | PRN
  Filled 2012-02-15: qty 237

## 2012-02-15 MED ORDER — MAGNESIUM SULFATE 40 MG/ML IJ SOLN
2.0000 g | Freq: Once | INTRAMUSCULAR | Status: AC | PRN
  Filled 2012-02-15: qty 50

## 2012-02-15 MED ORDER — TEMAZEPAM 15 MG PO CAPS: 15.0000 mg | ORAL_CAPSULE | Freq: Every evening | ORAL | Status: DC | PRN

## 2012-02-15 SURGICAL SUPPLY — 45 items
BANDAGE CONFORM 3  STR LF (GAUZE/BANDAGES/DRESSINGS) IMPLANT
BANDAGE ELASTIC 4 VELCRO ST LF (GAUZE/BANDAGES/DRESSINGS) ×2 IMPLANT
BANDAGE GAUZE ELAST BULKY 4 IN (GAUZE/BANDAGES/DRESSINGS) ×2 IMPLANT
BLADE AVERAGE 25X9 (BLADE) IMPLANT
BLADE MIC 41X13 (BLADE) ×1 IMPLANT
BLADE OSCILLATING /SAGITTAL (BLADE) ×1 IMPLANT
BLADE SAW RECIP 87.9 MT (BLADE) IMPLANT
CANISTER SUCTION 2500CC (MISCELLANEOUS) ×2 IMPLANT
CLOTH BEACON ORANGE TIMEOUT ST (SAFETY) ×2 IMPLANT
COVER SURGICAL LIGHT HANDLE (MISCELLANEOUS) ×2 IMPLANT
DRAPE EXTREMITY T 121X128X90 (DRAPE) ×2 IMPLANT
DRAPE ORTHO SPLIT 77X108 STRL (DRAPES) ×4
DRAPE SURG ORHT 6 SPLT 77X108 (DRAPES) IMPLANT
DRSG ADAPTIC 3X8 NADH LF (GAUZE/BANDAGES/DRESSINGS) ×1 IMPLANT
ELECT REM PT RETURN 9FT ADLT (ELECTROSURGICAL) ×2
ELECTRODE REM PT RTRN 9FT ADLT (ELECTROSURGICAL) ×1 IMPLANT
GLOVE BIOGEL M 6.5 STRL (GLOVE) ×3 IMPLANT
GLOVE BIOGEL M 8.0 STRL (GLOVE) ×1 IMPLANT
GLOVE BIOGEL PI IND STRL 6.5 (GLOVE) IMPLANT
GLOVE BIOGEL PI IND STRL 7.0 (GLOVE) IMPLANT
GLOVE BIOGEL PI IND STRL 7.5 (GLOVE) ×1 IMPLANT
GLOVE BIOGEL PI INDICATOR 6.5 (GLOVE) ×1
GLOVE BIOGEL PI INDICATOR 7.0 (GLOVE) ×1
GLOVE BIOGEL PI INDICATOR 7.5 (GLOVE) ×1
GLOVE SURG SS PI 7.5 STRL IVOR (GLOVE) ×2 IMPLANT
GOWN PREVENTION PLUS XXLARGE (GOWN DISPOSABLE) ×2 IMPLANT
GOWN STRL NON-REIN LRG LVL3 (GOWN DISPOSABLE) ×4 IMPLANT
KIT BASIN OR (CUSTOM PROCEDURE TRAY) ×2 IMPLANT
KIT ROOM TURNOVER OR (KITS) ×2 IMPLANT
NDL HYPO 25GX1X1/2 BEV (NEEDLE) IMPLANT
NEEDLE HYPO 25GX1X1/2 BEV (NEEDLE) IMPLANT
NS IRRIG 1000ML POUR BTL (IV SOLUTION) ×2 IMPLANT
PACK GENERAL/GYN (CUSTOM PROCEDURE TRAY) ×2 IMPLANT
PAD ARMBOARD 7.5X6 YLW CONV (MISCELLANEOUS) ×4 IMPLANT
SPECIMEN JAR SMALL (MISCELLANEOUS) ×2 IMPLANT
SPONGE GAUZE 4X4 12PLY (GAUZE/BANDAGES/DRESSINGS) ×2 IMPLANT
SUT ETHILON 2 0 PSLX (SUTURE) ×2 IMPLANT
SUT ETHILON 3 0 PS 1 (SUTURE) ×2 IMPLANT
SUT SILK 2 0 SH CR/8 (SUTURE) ×1 IMPLANT
SYR CONTROL 10ML LL (SYRINGE) IMPLANT
TOWEL OR 17X24 6PK STRL BLUE (TOWEL DISPOSABLE) ×2 IMPLANT
TOWEL OR 17X26 10 PK STRL BLUE (TOWEL DISPOSABLE) ×2 IMPLANT
TUBE ANAEROBIC SPECIMEN COL (MISCELLANEOUS) IMPLANT
UNDERPAD 30X30 INCONTINENT (UNDERPADS AND DIAPERS) ×2 IMPLANT
WATER STERILE IRR 1000ML POUR (IV SOLUTION) ×2 IMPLANT

## 2012-02-15 NOTE — Interval H&P Note (Signed)
History and Physical Interval Note:  02/15/2012 12:58 PM  Alan [name]  has presented today for surgery, with the diagnosis of PVD W/ GANGRENE   The various methods of treatment have been discussed with the patient and family. After consideration of risks, benefits and other options for treatment, the patient has consented to  Procedure(s) (LRB) with comments: TRANSMETATARSAL AMPUTATION (Right) - VS BKA as a surgical intervention .  The patient's history has been reviewed, patient examined, no change in status, stable for surgery.  I have reviewed the patient's chart and labs.  Questions were answered to the patient's satisfaction.     Alan [name]  I had a lengthy, approximately 30 minute discussion with the patient outlining our options. I do not feel that his foot is salvageable at this time. He does not have options for revascularization. Therefore, the most clinical decision will be at what level of agitation to proceed with. I presented 3 options #1 would be to proceed with a below-the-knee amputation. #2 would be to proceed with a transmetatarsal hypertension. #3 would be to go to the operating room planning a trans-metatarsal amputation, however if the wound is clearly nonviable, to proceed with a below-the-knee amputation at that setting. The patient wishes to proceed with #3. He currently is in rehabilitation but given the fact that he is going to lose part or all of his right leg he wishes to go home to address her personal needs. I feel this is reasonable as he is currently on Coumadin and needs time for his INR to correct. Stop his Coumadin today

## 2012-02-15 NOTE — Consult Note (Signed)
[facility] Renal Consultation Note    Indication for Consultation:  Management of ESRD/hemodialysis; anemia, hypertension/volume and secondary hyperparathyroidism  HPI: Alan [name] is a 79 y.o. male with ESRD on TTS dialysis at [facility] who is admitted today following a right transmet due to non healing right foot from prior right second toe amputation in January. He recently had been discharged from rehab 02/06/2012.  Past Medical History  Diagnosis Date  . Diabetes mellitus   . Gout   . Hypertension   . Hyperlipidemia   . Arthritis   . Prostate cancer     s/p seed implant  . Cataract     BILATERAL-BEEN REMOVED  . Neuromuscular disorder     DABETIC NEUROPATHY-LOWER EXTREMITY  . Atrial fibrillation     on coumadin  . Aortic stenosis     moderate to severe, not felt to be a surgical candidate  . Anemia     secondary to end stage renal disease  . Coronary artery disease     remote CABG in 1996; last stress test in 2005; last cath 2005  . Diabetic neuropathy   . Edema   . PAD (peripheral artery disease)   . Diabetic retinopathy(362.0)   . Chronic anticoagulation     on coumadin  . Shortness of breath   . Chronic kidney disease     Started dialysis October 2012  . Nephrolithiasis     prior history of percutaneous nephrostomy  . Myocardial infarction     1999 ish   Past Surgical History  Procedure Date  . Ventral hernia repair   . Inguinal hernia repair     right side  . Colonoscopy   . Kidney stone surgery   . Polypectomy   . Tonsillectomy   . Toe surgery     removal little toe right foot  . Cardiac catheterization 04/19/2003    SEVERE 2 VESSEL OBSTRUCTIVE ATHERSCLEROTIC CAD  . Coronary artery bypass graft 1996    LIMA GRAFT TO THE LAD, SEQUENTIAL SAPHENOUS  VEIN GRAFT TO THE FIRST AND SECOND DIAGONIAL BRANCHES, SEQUENTIAL SAPHENOUS VEIN GRAFT TO THE ACUTE MARGINAL, POSTERIOR DESCENDING, AND POSTERIOR LATERAL BRANCHES OF THE RIGHT  CORONARY ARTERY  . Radioactive seed implant   . US echocardiography 09/06/2009    EF 55-60%  . Cardiovascular stress test 04/13/2003    EF 54%. EVIDENCE OF ANTERO-APICAL ISCHEMIA. NORMAL LV SIZE AND FUNCTION  . Tee without cardioversion 05/09/2011    Procedure: TRANSESOPHAGEAL ECHOCARDIOGRAM (TEE);  Surgeon: Laurey Morale, MD;  Location: [facility] ENDOSCOPY;  Service: Cardiovascular;  Laterality: N/A;  . Hernia repair   . Amputation 07/20/2011    Procedure: AMPUTATION BELOW KNEE;  Surgeon: Nada Libman, MD;  Location: [facility] OR;  Service: Vascular;  Laterality: Left;  . Amputation 01/25/2012    Procedure: AMPUTATION DIGIT;  Surgeon: Chuck Hint, MD;  Location: [facility] OR;  Service: Vascular;  Laterality: Right;  second  . Av fistula placement     right   Family History  Problem Relation Age of Onset  . Heart disease Father   . Hypertension Brother   . Stroke Brother   . Diabetes Mother     Pt. not sure, but he thinks she did.   Social History:  reports that he quit smoking about 43 years ago. His smoking use included Cigarettes. He quit after 2 years of use. He has quit using smokeless tobacco. His smokeless tobacco use included Chew. He reports that he does  not drink alcohol or use illicit drugs. Allergies  Allergen Reactions  . Ancef (Cefazolin) Rash    Patient cant recall if allergy to this med exists.    Prior to Admission medications   Medication Sig Start Date End Date Taking? Authorizing Provider  allopurinol (ZYLOPRIM) 300 MG tablet Take 0.5 tablets (150 mg total) by mouth daily. 02/06/12  Yes Mcarthur Rossetti Angiulli, PA  aspirin EC 81 MG tablet Take 1 tablet (81 mg total) by mouth daily. 02/06/12  Yes Daniel J Angiulli, PA  calcium carbonate (TUMS - DOSED IN MG ELEMENTAL CALCIUM) 500 MG chewable tablet Chew 1 tablet by mouth 3 (three) times daily with meals. Phosphorous binder   Yes Historical Provider, MD  estazolam (PROSOM) 2 MG tablet Take 1 mg by mouth at bedtime as needed. For sleep    Yes Historical Provider, MD  glimepiride (AMARYL) 4 MG tablet Take 2 mg by mouth daily with breakfast. 02/06/12  Yes Mcarthur Rossetti Angiulli, PA  ibuprofen (ADVIL,MOTRIN) 200 MG tablet Take 200 mg by mouth every 6 (six) hours as needed. For pain   Yes Historical Provider, MD  metoprolol tartrate (LOPRESSOR) 25 MG tablet Take 37.5 mg by mouth 2 (two) times daily. Takes 1.5 tablets twice daily   Yes Historical Provider, MD  simvastatin (ZOCOR) 40 MG tablet Take 1 tablet (40 mg total) by mouth at bedtime. 02/06/12  Yes Mcarthur Rossetti Angiulli, PA  warfarin (COUMADIN) 5 MG tablet Take 5 mg by mouth See admin instructions. Patient takes 1 tablet every day, except Friday-takes 1/2 tablet.   Yes Historical Provider, MD   Current Facility-Administered Medications  Medication Dose Route Frequency Provider Last Rate Last Dose  . 0.9 %  sodium chloride infusion   Intravenous Continuous Nada Libman, MD 20 mL/hr at 02/15/12 1315    . 0.9 % irrigation (POUR BTL)    PRN Nada Libman, MD   1,000 mL at 02/15/12 1352  . bacitracin ointment    PRN Nada Libman, MD   1 application at 02/15/12 1419  . vancomycin (VANCOCIN) 1,500 mg in sodium chloride 0.9 % 500 mL IVPB  1,500 mg Intravenous 120 min pre-op Nada Libman, MD      . vancomycin (VANCOCIN) IVPB 1000 mg/200 mL premix  1,000 mg Intravenous Q hemodialysis Marlowe Shores, PA       Facility-Administered Medications Ordered in Other Encounters  Medication Dose Route Frequency Provider Last Rate Last Dose  . etomidate (AMIDATE) injection    PRN Jerilee Hoh, CRNA   16 mg at 02/15/12 1335  . fentaNYL (SUBLIMAZE) injection    PRN Blima Rich Mumm, CRNA   25 mcg at 02/15/12 1347  . lidocaine (cardiac) 100 mg/25ml (XYLOCAINE) 20 MG/ML injection 2%    PRN Blima Rich Mumm, CRNA   100 mg at 02/15/12 1335  . phenylephrine (NEO-SYNEPHRINE) 0.04 mg/mL in sodium chloride 0.9 % 250 mL infusion  10 mg  Continuous PRN Blima Rich Mumm, CRNA 30 mL/hr at 02/15/12 1400 20 mcg/min  at 02/15/12 1400   Labs: Basic Metabolic Panel:  Lab 02/15/12 1610  NA 139  K 3.9  CL 96  CO2 33*  GLUCOSE 89  BUN 20  CREATININE 5.18*  CALCIUM 9.1  ALB --  PHOS --   Lab Results  Component Value Date   INR 1.27 02/15/2012   Liver Function Tests:  Lab 02/15/12 0932  AST 20  ALT 12  ALKPHOS 63  BILITOT 0.6  PROT 8.0  ALBUMIN 3.0*  CBC:  Lab 02/15/12 0932  WBC 9.0  NEUTROABS --  HGB 11.9*  HCT 37.3*  MCV 93.0  PLT 235  CBG:  Lab 02/15/12 1302 02/15/12 1139 02/15/12 0936  GLUCAP 75 91 84  ROS:  Physical Exam: Filed Vitals:   02/15/12 0928  BP: 165/94  Pulse: 80  Temp: 98 F (36.7 C)  TempSrc: Oral  Resp: 18  SpO2: 98%     General: Well developed, well nourished, in no acute distress.Talkative, Pale Head: Normocephalic, atraumatic, sclera non-icteric, mucus membranes are moist Fundi with scarring. A few lower teeth, no upper Neck: PCL Lungs: decreased bs, no R,R, or W. Normal expansion  Heart:reg with prematures, Gr2/6 holosys M, 2+edema of RLE , bilat fem bruits Abdomen: Soft, non-tender, non-distended Liver down 3 cm Lower extremities: left BKA right R TMA Neuro: O x3, CN 2-12 intact, Mtr 5/5 UE, LE not eval Dialysis Access: right upper AVF B&T  Dialysis Orders: [facility] TTS EDW 102.5 kg HD Bath 2.0 k, 2.25 ca Time 4 hrs 15 min Heparin 3,400units. Access Right upper arm avf BFR 500 DFR 800 Hectoral 4 mcg IV/HD Epogen 6000  Venofer 100mg  q weekly hd though 2/13;  Last iPTH 351 and Hgb 10.6  Assessment/Plan: 1. PVD - s/p right transmet - [er Dr/ Myra Gianotti 2.  ESRD -  TTS - continue usual dialysis regimen; no heparin on Saturday- all orders under sign and hold 3.  Hypertension/volume  - metoprolol and volume control 4.  Anemia  - Hgb 11.9 Aranesp ordered, but can hold if Hgb not down post op 5.  Metabolic bone disease -  Continue binders and hectorol 6.  Nutrition - will need renal diet and vitmain 7.  Atrial fib - on chronic coumadin pre  surgery 8. CAD/CABG 1996 - mod AS/nonsurgical candidate 9. Type 2 DM - per primary 10.  Gout - on allopurinol 11. Hyperlipidemia - on statin  Sheffield Slider, PA-C [facility] Beeper [phone] 02/15/2012, 2:30 PM I have seen and examined this patient and agree with the plan of care see changes.   .  Gor Vestal L 02/15/2012, 4:08 PM

## 2012-02-15 NOTE — Telephone Encounter (Signed)
Message copied by Carmela Hurt on Fri Feb 15, 2012 11:23 AM ------      Message from: Carmela Hurt      Created: Mon Feb 11, 2012 11:40 AM      Regarding: patients surgery change date       Dr [place]            Talked with pts wife this am, Mr Streaters surgery date changed from today to Friday the 02/15/2012 due to emergency. Wife states patient has been instructed to stay off his coumadin for anther 5 days, a total of 10 days off. His INR from dialysis 02/07/2012--2.81.            Thanks, Addison Lank, RN

## 2012-02-15 NOTE — Anesthesia Postprocedure Evaluation (Signed)
Anesthesia Post Note  Patient: Alan [name]  Procedure(s) Performed: Procedure(s) (LRB): TRANSMETATARSAL AMPUTATION (Right)  Anesthesia type: General  Patient location: PACU  Post pain: Pain level controlled  Post assessment: Post-op Vital signs reviewed  Last Vitals: BP 93/48  Pulse 82  Temp 37.1 C (Oral)  Resp 21  SpO2 96%  Post vital signs: Reviewed  Level of consciousness: sedated  Complications: No apparent anesthesia complications

## 2012-02-15 NOTE — H&P (View-Only) (Signed)
[facility] Consult  Reason for Consult:  Gangrenous toe right foot Referring Physician:  ED  History of Present Illness: This is a 79 y.o. male admitted with gangrenous right 2nd toe.  He was having his wound debrided at the wound care center yesterday where he was found to have a great deal of swelling of the foot and the 2nd right toe was gangrenous.  The pt had a fever of close to 101 at the wound care center.  Due to these circumstances and his hx of ESRD, CAD s/p CABG '96, HTN, Afib and on coumadin, gout and DM neuropathy.  He has chronic PVD with hx of left BKA ~ a year ago.  IN the ED, his temp was 100.4 and WBC of 14.1.  He was placed on IV ABx and his WBC today is down to 13 and a Tm 102 now 98.4.  Pt's INR today is 5.2.   Past Medical History  Diagnosis Date  . Diabetes mellitus   . Gout   . Hypertension   . Hyperlipidemia   . Chronic kidney disease     Started dialysis October 2012  . Arthritis   . Prostate cancer     s/p seed implant  . Cataract     BILATERAL-BEEN REMOVED  . Neuromuscular disorder     DABETIC NEUROPATHY-LOWER EXTREMITY  . Atrial fibrillation     on coumadin  . Aortic stenosis     moderate to severe, not felt to be a surgical candidate  . Anemia     secondary to end stage renal disease  . Coronary artery disease     remote CABG in 1996; last stress test in 2005; last cath 2005  . Diabetic neuropathy   . Edema   . PAD (peripheral artery disease)   . Diabetic retinopathy(362.0)   . Chronic anticoagulation     on coumadin  . Nephrolithiasis     prior history of percutaneous nephrostomy  . Shortness of breath    Past Surgical History  Procedure Date  . Ventral hernia repair   . Inguinal hernia repair     right side  . Colonoscopy   . Kidney stone surgery   . Polypectomy   . Tonsillectomy   . Toe surgery     removal little toe right foot  . Cardiac catheterization 04/19/2003    SEVERE 2 VESSEL OBSTRUCTIVE ATHERSCLEROTIC  CAD  . Coronary artery bypass graft 1996    LIMA GRAFT TO THE LAD, SEQUENTIAL SAPHENOUS  VEIN GRAFT TO THE FIRST AND SECOND DIAGONIAL BRANCHES, SEQUENTIAL SAPHENOUS VEIN GRAFT TO THE ACUTE MARGINAL, POSTERIOR DESCENDING, AND POSTERIOR LATERAL BRANCHES OF THE RIGHT CORONARY ARTERY  . Radioactive seed implant   . Us echocardiography 09/06/2009    EF 55-60%  . Cardiovascular stress test 04/13/2003    EF 54%. EVIDENCE OF ANTERO-APICAL ISCHEMIA. NORMAL LV SIZE AND FUNCTION  . Tee without cardioversion 05/09/2011    Procedure: TRANSESOPHAGEAL ECHOCARDIOGRAM (TEE);  Surgeon: Dalton S McLean, MD;  Location: [facility] ENDOSCOPY;  Service: Cardiovascular;  Laterality: N/A;  . Hernia repair   . Amputation 07/20/2011    Procedure: AMPUTATION BELOW KNEE;  Surgeon: [name] W Brabham, MD;  Location: [facility] OR;  Service: Vascular;  Laterality: Left;    Allergies  Allergen Reactions  . Ancef (Cefazolin) Rash    Prior to Admission medications   Medication Sig Start Date End Date Taking? Authorizing Provider  allopurinol (ZYLOPRIM) 300 MG tablet Take 150 mg by mouth daily.     Yes Historical Provider, MD  aspirin EC 81 MG tablet Take 81 mg by mouth daily.   Yes Historical Provider, MD  calcium carbonate (TUMS - DOSED IN MG ELEMENTAL CALCIUM) 500 MG chewable tablet Chew 1 tablet by mouth 3 (three) times daily with meals. Phosphorous binder   Yes Historical Provider, MD  Chlorphen-Pseudoephed-APAP (CORICIDIN D PO) Take 1 tablet by mouth at bedtime as needed. For cold symptoms   Yes Historical Provider, MD  estazolam (PROSOM) 2 MG tablet Take 1 mg by mouth at bedtime as needed. For sleep   Yes Historical Provider, MD  glimepiride (AMARYL) 4 MG tablet Take 2 mg by mouth daily.    Yes Historical Provider, MD  metoprolol tartrate (LOPRESSOR) 25 MG tablet Take 37.5 mg by mouth 2 (two) times daily.   Yes Historical Provider, MD  simvastatin (ZOCOR) 40 MG tablet Take 40 mg by mouth at bedtime.  11/16/11  Yes Historical Provider, MD    warfarin (COUMADIN) 5 MG tablet Take 2.5-5 mg by mouth daily. Takes 5 mg every day of the week except on Fridays, he takes 2.5 mg   Yes Historical Provider, MD    History   Social History  . Marital Status: Married    Spouse Name: N/A    Number of Children: N/A  . Years of Education: N/A   Occupational History  . Not on file.   Social History Main Topics  . Smoking status: Former Smoker -- 2 years    Types: Cigarettes    Quit date: 04/10/1968  . Smokeless tobacco: Former User    Types: Chew  . Alcohol Use: No  . Drug Use: No  . Sexually Active: No   Other Topics Concern  . Not on file   Social History Narrative  . No narrative on file    Family History  Problem Relation Age of Onset  . Heart disease Father   . Hypertension Brother   . Stroke Brother   . Diabetes Mother     Pt. not sure, but he thinks she did.    ROS: [x] Positive   [ ] Negative   [ ] All sytems reviewed and are negative   General: [ ] Weight loss, [ ] Weight gain, [ ]  Loss of appetite, [ ] Fever Neurologic: [ ] Dizziness, [ ] Blackouts, [ ] Headaches, [ ] Seizure Ear/Nose/Throat: [ ] Change in eyesight, [ ] Change in hearing, [ ] Nose bleeds, [ ] Sore throat Vascular: [ ] Pain in legs with walking, [ ] Pain in feet while lying flat, [x ] Non-healing ulcer,[ ] Stroke, [ ] "Mini stroke", [ ] Slurred speech, [ ] Temporary blindness, [ ] Blood clot in vein, [ ] Phlebitis Pulmonary: [ ] Home oxygen, [ ] Productive cough, [ ] Bronchitis, [ ] Coughing up blood,  [ ] Asthma, [ ] Wheezing Musculoskeletal: [ ] Arthritis, [ ] Joint pain, [ ] Muscle pain Cardiac: [ ] Chest pain, [ ] Chest tightness/pressure, [ ] Shortness of breath when lying flat, [ ] Shortness of breath with exertion, [ ] Palpitations, [ ] Heart murmur, [ ] Arrythmia,  [ ] Atrial fibrillation Hematologic: [ x] Bleeding problems-on coumadin, [ ] Clotting disorder, [ ] Anemia Psychiatric:  [ ] Depression, [ ] Anxiety, [ ] Attention deficit  disorder Gastrointestinal:  [ ] Black stool,[ ]  Blood in stool, [ ] Peptic ulcer disease, [ ] Reflux, [ ] Hiatal hernia, [ ] Trouble swallowing, [ ]   Diarrhea, [ ] Constipation Urinary:  [ ] Kidney disease, [ ] Burning with urination, [ ] Frequent urination, [ ] Difficulty urinating Skin: [ ] Ulcers, [ ] Rashes   Physical Examination  Filed Vitals:   01/22/12 0816  BP: 106/70  Pulse: 79  Temp: 98.4 F (36.9 C)  Resp: 20   Body mass index is 37.97 kg/(m^2).  General:  WDWN in NAD Gait: Not observed HENT: WNL Eyes: Pupils equal Pulmonary: normal non-labored breathing , without Rales, rhonchi,  wheezing Cardiac:irregular Abdomen: soft, NT, no masses Skin: no rashes, ulcers noted Vascular Exam/Pulses:2nd great toe with gangrenous changes; no bone exposure, but there is a pin hole sized opening that possibly tracts to the bone. Extremities: with ischemic changes to 2nd right toe with Gangrene , no cellulitis; + edema RLE;  Left BKA with wrap Musculoskeletal: no muscle wasting or atrophy  Neurologic: A&O X 3; Appropriate Affect ; SENSATION: normal; MOTOR FUNCTION:  moving all extremities equally. Speech is fluent/normal  Non-Invasive Vascular Imaging:  ASSESSMENT/PLAN: This is a 79 y.o. male with gangrenous 2nd right toe  -the 2nd right toe may or may not be source of fever -continue IV ABx -hold coumadin as INR is 5.2.  INR will need to be less than 1.6 for the OR -will schedule pt for right 2nd toe amputation as this toe is not going to heal and this will either be Thursday or Friday depending on INR. -discontinue NPO today -HD per renal service   Samantha Rhyne, PA-C [facility] [phone]  I have seen and evaluated the above patient. He is well known to me having undergone left leg amputation. He also has had chronic ulcers on his right foot. I have attempted several times to revascularize his right leg percutaneously, these have been unsuccessful. He  is not a candidate for surgical revascularization. He presented with concerns over gangrene to his right second toe. He has had fevers overnight. Currently he does not appear septic. He is on Coumadin for atrial fibrillation. His INR is 5.2 today.  On examination the right second toe has had the nailbed removed. There is a small opening which appears to track down to the bone. I could not express any purulence.  The patient will require second toe amputation on the right. I would not do this until his Coumadin is down to 1.5 or less. I do not feel that he is septic currently from his toe. I will be out of town so I will have one of my partners assumed his care tomorrow and schedule his amputation accordingly. I did discuss with the patient back if he is not able to heal the incision site do to poor circulation, he would require a more proximal amputation which could ultimately lead to a below versus above-knee amputation.  Wells Brabham   

## 2012-02-15 NOTE — Anesthesia Preprocedure Evaluation (Addendum)
Anesthesia Evaluation  Patient identified by MRN, date of birth, ID band Patient awake    Reviewed: Allergy & Precautions, H&P , NPO status , Patient's Chart, lab work & pertinent test results  Airway Mallampati: II TM Distance: >3 FB Neck ROM: Full    Dental  (+) Edentulous Upper, Partial Lower and Dental Advisory Given   Pulmonary shortness of breath and with exertion,  breath sounds clear to auscultation        Cardiovascular hypertension, Pt. on medications and Pt. on home beta blockers + CAD, + Past MI and + Peripheral Vascular Disease + dysrhythmias Atrial Fibrillation + Valvular Problems/Murmurs AS Rhythm:Irregular Rate:Normal   Left ventricle: The cavity size was normal. Wall thickness   was increased in a pattern of mild LVH. Systolic function   was normal. The estimated ejection fraction was in the   range of 55% to 60%. Wall motion was normal; there were no   regional wall motion abnormalities. - Aortic valve: Trileaflet; severely calcified leaflets. Difficult to comment on presence or absence of vegetation  given heavy calcification, but suspect no vegetation is  present. There was severe stenosis. Mean gradient:84mm Hg (S). Peak gradient: 63mm Hg (S). Valve area: 0.5cm^2(VTI). - Aorta: Normal caliber aorta, grade III plaque in arch and   descending thoracic aorta. - Mitral valve: Mildly to moderately calcified annulus.   Mildly calcified leaflets . No evidence of vegetation.   Trivial regurgitation. - Left atrium: The atrium was mildly dilated. No evidence of  thrombus in the atrial cavity or appendage. - Right atrium: No evidence of thrombus in the atrial cavity   or appendage. - Atrial septum: No defect or patent foramen ovale was   identified. Echo contrast study showed no right-to-left   atrial level shunt, at baseline or with provocation. - Tricuspid valve: No evidence of vegetation. Peak RV-RA   gradient: 27mm Hg  (S). - Pulmonic valve: No evidence of vegetation.  Impressions: - No evidence for endocarditis. Severe aortic stenosis.   Neuro/Psych  Neuromuscular disease negative psych ROS   GI/Hepatic negative GI ROS, Neg liver ROS,   Endo/Other  diabetes, Type 2, Insulin Dependent  Renal/GU ESRF and DialysisRenal disease     Musculoskeletal   Abdominal   Peds  Hematology   Anesthesia Other Findings   Reproductive/Obstetrics                         Anesthesia Physical  Anesthesia Plan  ASA: IV  Anesthesia Plan: General   Post-op Pain Management:    Induction: Intravenous  Airway Management Planned: LMA  Additional Equipment:   Intra-op Plan:   Post-operative Plan: Extubation in OR  Informed Consent: I have reviewed the patients History and Physical, chart, labs and discussed the procedure including the risks, benefits and alternatives for the proposed anesthesia with the patient or authorized representative who has indicated his/her understanding and acceptance.   Dental advisory given  Plan Discussed with: CRNA, Surgeon and Anesthesiologist  Anesthesia Plan Comments: (Delay in getting IV access. Surgeon planning transmet amp or possible BKA. Pt would require fem/sciatic PNB and ~ 1hr "setup" time. Plan general due to uncertainty of surgical plan.)      Anesthesia Quick Evaluation

## 2012-02-15 NOTE — Telephone Encounter (Signed)
As of today I have not gotten a response from Dr [place]

## 2012-02-15 NOTE — Anesthesia Procedure Notes (Signed)
Procedure Name: LMA Insertion Date/Time: 02/15/2012 1:36 PM Performed by: Jerilee Hoh Pre-anesthesia Checklist: Patient identified, Emergency Drugs available, Suction available and Patient being monitored Patient Re-evaluated:Patient Re-evaluated prior to inductionOxygen Delivery Method: Circle system utilized Preoxygenation: Pre-oxygenation with 100% oxygen Intubation Type: IV induction LMA: LMA inserted LMA Size: 4.0 Tube type: Oral Number of attempts: 1 Placement Confirmation: positive ETCO2 and breath sounds checked- equal and bilateral Tube secured with: Tape Dental Injury: Teeth and Oropharynx as per pre-operative assessment

## 2012-02-15 NOTE — Transfer of Care (Signed)
Immediate Anesthesia Transfer of Care Note  Patient: Alan [name]  Procedure(s) Performed: Procedure(s) (LRB) with comments: TRANSMETATARSAL AMPUTATION (Right)  Patient Location: PACU  Anesthesia Type:General  Level of Consciousness: awake and alert   Airway & Oxygen Therapy: Patient Spontanous Breathing and Patient connected to face mask oxygen  Post-op Assessment: Report given to PACU RN, Post -op Vital signs reviewed and stable and Patient moving all extremities X 4  Post vital signs: Reviewed and stable  Complications: No apparent anesthesia complications

## 2012-02-15 NOTE — Op Note (Addendum)
[facility]  Patient name: Alan [name] MRN: [id number] DOB: [birth year] Sex: male  02/15/2012 Pre-operative Diagnosis: Right foot ulcer Post-operative diagnosis:  Same Surgeon:  Jorge Ny Assistants:  None Procedure:  Right transmetatarsal amputation Anesthesia:  Gen. Blood Loss:  See anesthesia record Specimens:  Right forefoot and  Findings:  Healthy bleeding at the amputation site. The amputation had to be taken more proximal because of skin necrosis on the posterior flap which prevented me from having enough tissue to close the wound over the bone.  Indications:  The patient has developed ischemic changes to his right forefoot. He has previously undergone a right toe a dictation. He developed blistering and skin necrosis on the plantar aspect of his foot. He comes in for transmetatarsal amputation versus below-knee amputation.  Procedure:  The patient was identified in the holding area and taken to [facility] OR ROOM 10  The patient was then placed supine on the table. general anesthesia was administered.  The patient was prepped and draped in the usual sterile fashion.  A time out was called and antibiotics were administered.  A fishmouth incision was made in the forefoot. The posterior side of the skin incision was slightly more proximal because of skin necrosis on the posterior side of his foot. The 10 blade was taken down to the bone. A periosteal elevator was used to elevate the periosteum. An oscillating saw was used to transect through the bone. There did appear to be adequate bleeding from the tissue surfaces which all appeared healthy. There was pulsatile blood flow out of the dorsalis pedis artery. The bone was resected along with the forefoot and sent as a specimen. Unfortunately, because of the need to take more skin on the plantar side of the foot, it was difficult to close the wound over the bone. I therefore used the saw to resect additional  bone. I was ultimately able to reapproximate the skin edges. The wound was then irrigated. Hemostasis was achieved. The skin was reapproximated with interrupted 2-0 nylon vertical mattress suture. Sterile dressings were applied.   Disposition:  To PACU in stable condition.   Juleen China, M.D. [facility] [place] Office: [phone] Pager:  [phone]

## 2012-02-16 LAB — GLUCOSE, CAPILLARY
Glucose-Capillary: 128 mg/dL — ABNORMAL HIGH (ref 70–99)
Glucose-Capillary: 146 mg/dL — ABNORMAL HIGH (ref 70–99)

## 2012-02-16 LAB — BASIC METABOLIC PANEL
BUN: 31 mg/dL — ABNORMAL HIGH (ref 6–23)
Calcium: 8.2 mg/dL — ABNORMAL LOW (ref 8.4–10.5)
Chloride: 96 mEq/L (ref 96–112)
Creatinine, Ser: 6.72 mg/dL — ABNORMAL HIGH (ref 0.50–1.35)
GFR calc Af Amer: 8 mL/min — ABNORMAL LOW (ref >90)
GFR calc non Af Amer: 7 mL/min — ABNORMAL LOW (ref >90)

## 2012-02-16 LAB — CBC
HCT: 31.7 % — ABNORMAL LOW (ref 39.0–52.0)
MCHC: 30.3 g/dL (ref 30.0–36.0)
Platelets: 213 10*3/uL (ref 150–400)
RDW: 14.5 % (ref 11.5–15.5)
WBC: 6.8 10*3/uL (ref 4.0–10.5)

## 2012-02-16 LAB — HEMOGLOBIN A1C
Hgb A1c MFr Bld: 6.2 % — ABNORMAL HIGH (ref <5.7)
Mean Plasma Glucose: 131 mg/dL — ABNORMAL HIGH (ref <117)

## 2012-02-16 MED ORDER — METOPROLOL TARTRATE 12.5 MG HALF TABLET
12.5000 mg | ORAL_TABLET | Freq: Two times a day (BID) | ORAL | Status: DC
  Administered 2012-02-18 – 2012-02-20 (×5): 12.5 mg via ORAL
  Filled 2012-02-16 (×10): qty 1

## 2012-02-16 MED ORDER — DARBEPOETIN ALFA-POLYSORBATE 150 MCG/0.3ML IJ SOLN
INTRAMUSCULAR | Status: AC
  Filled 2012-02-16: qty 0.3

## 2012-02-16 MED ORDER — DARBEPOETIN ALFA-POLYSORBATE 150 MCG/0.3ML IJ SOLN
150.0000 ug | INTRAMUSCULAR | Status: DC
  Administered 2012-02-16: 150 ug via INTRAVENOUS
  Filled 2012-02-16: qty 0.3

## 2012-02-16 NOTE — Progress Notes (Signed)
Subjective: Interval History: none.  Objective: Vital signs in last 24 hours: Temp:  [97.3 F (36.3 C)-98.7 F (37.1 C)] 98.2 F (36.8 C) (02/08 0440) Pulse Rate:  [77-85] 80 (02/08 0440) Resp:  [8-24] 18 (02/08 0440) BP: (93-165)/(44-94) 96/44 mmHg (02/08 0440) SpO2:  [94 %-100 %] 97 % (02/08 0440) Weight:  [102.7 kg (226 lb 6.6 oz)] 102.7 kg (226 lb 6.6 oz) (02/07 1741) Weight change:   Intake/Output from previous day: 02/07 0701 - 02/08 0700 In: 643 [P.O.:440; I.V.:203] Out: 100 [Blood:100] Intake/Output this shift:    General appearance: alert, cooperative, moderately obese and pale Resp: diminished breath sounds bilaterally Cardio: S1, S2 normal, systolic murmur: systolic ejection 2/6, decrescendo at 2nd left intercostal space and diastolic murmur: early diastolic 2/6, decrescendo at 2nd left intercostal space GI: pos bs, obese, liver down 4 cm Extremities: avf RUA, L BKA, RTMA  Lab Results:  Recent Labs  02/15/12 0932 02/16/12 0450  WBC 9.0 6.8  HGB 11.9* 9.6*  HCT 37.3* 31.7*  PLT 235 213   BMET:  Recent Labs  02/15/12 0932 02/16/12 0450  NA 139 138  K 3.9 4.1  CL 96 96  CO2 33* 33*  GLUCOSE 89 137*  BUN 20 31*  CREATININE 5.18* 6.72*  CALCIUM 9.1 8.2*   No results found for this basename: PTH,  in the last 72 hours Iron Studies: No results found for this basename: IRON, TIBC, TRANSFERRIN, FERRITIN,  in the last 72 hours  Studies/Results: No results found.  I have reviewed the patient's current medications.  Assessment/Plan: 1 CRF for HD, vol ok 2 HTN lower meds 3 DM controlled 4 PVD per VVS 5 CAD stable 6 AS/AI 7 Anemia stable from several days ago P Hd,epo, lower meds,     LOS: 1 day   Alan [name] L 02/16/2012,7:43 AM

## 2012-02-16 NOTE — Progress Notes (Signed)
PT Cancellation Note  Patient Details Name: Alan [name] MRN: [id number] DOB: 1931-11-04   Cancelled Treatment:    Reason Eval/Treat Not Completed: Other (comment) (Pt refusing stating his foot was bleeding last night.)   Belissa Kooy 02/16/2012, 10:16 AM

## 2012-02-16 NOTE — Progress Notes (Signed)
Floor rn received report from hemodialysis as follows: 280 cc fluid dialyzed, vitals-100.3-120-22-103/56,94 /ra.  Patient vancomycin,aransep and iron administered whille in HD.  Floor RN received patient.  Patient resting comfortably.  Vitals obtained on the unit as: 98.4-126-18-94/40 98/RA.

## 2012-02-16 NOTE — Progress Notes (Signed)
VASCULAR PROGRESS NOTE  SUBJECTIVE: pain adequately controlled.  PHYSICAL EXAM: Filed Vitals:   02/15/12 1741 02/15/12 2034 02/15/12 2235 02/16/12 0440  BP:  114/58  96/44  Pulse:  79 82 80  Temp:  98.1 F (36.7 C)  98.2 F (36.8 C)  TempSrc:  Oral  Oral  Resp:  18  18  Height: 6' 0.05" (1.83 m)     Weight: 226 lb 6.6 oz (102.7 kg)     SpO2:  100%  97%   Dressing changed. Currently no significant bleeding although there was bloody drainage on the dressing.  LABS: Lab Results  Component Value Date   WBC 6.8 02/16/2012   HGB 9.6* 02/16/2012   HCT 31.7* 02/16/2012   MCV 93.2 02/16/2012   PLT 213 02/16/2012   Lab Results  Component Value Date   CREATININE 6.72* 02/16/2012   Lab Results  Component Value Date   INR 1.27 02/15/2012   CBG (last 3)   Recent Labs  02/15/12 1519 02/15/12 2102 02/16/12 0620  GLUCAP 88 188* 128*    Active Problems:   * No active hospital problems. *   ASSESSMENT AND PLAN: 1. 1 Day Post-Op s/p: right transmetatarsal amputation. 2. Hemoglobin is down. Will check H&H tomorrow. 3. Given oozing from wound, would try to avoid heparin on dialysis today.  Cari Caraway Beeper: [phone] 02/16/2012

## 2012-02-16 NOTE — Procedures (Signed)
I was present at this session.  I have reviewed the session itself and made appropriate changes. BP & vol ok. olc green. Access press ok.  Alan [name] L 2/8/201411:40 AM

## 2012-02-16 NOTE — Progress Notes (Signed)
Upon assessment, pt's dsg on right foot was saturated with blood. Pt hypotensive and hgb dropped to 9.6 since last am, pt asymptomatic with no complaints. Surgeon on-call notified, surgeon at bedside re-dressing wound. OK to go to HD per MD, asked to hold heparin. Report given to HD nurse and day shift RN.

## 2012-02-17 LAB — GLUCOSE, CAPILLARY
Glucose-Capillary: 109 mg/dL — ABNORMAL HIGH (ref 70–99)
Glucose-Capillary: 161 mg/dL — ABNORMAL HIGH (ref 70–99)

## 2012-02-17 LAB — CBC
Hemoglobin: 9 g/dL — ABNORMAL LOW (ref 13.0–17.0)
MCH: 28.8 pg (ref 26.0–34.0)
MCV: 93.6 fL (ref 78.0–100.0)
Platelets: 197 10*3/uL (ref 150–400)
RBC: 3.13 MIL/uL — ABNORMAL LOW (ref 4.22–5.81)
WBC: 7.2 10*3/uL (ref 4.0–10.5)

## 2012-02-17 NOTE — Progress Notes (Signed)
Subjective: Interval History: has complaints pain in stump.  Objective: Vital signs in last 24 hours: Temp:  [98.3 F (36.8 C)-100 F (37.8 C)] 99.7 F (37.6 C) (02/09 0443) Pulse Rate:  [79-126] 94 (02/09 0443) Resp:  [11-19] 19 (02/09 0443) BP: (90-128)/(40-64) 90/48 mmHg (02/09 0443) SpO2:  [95 %-98 %] 97 % (02/09 0443) Weight:  [100.8 kg (222 lb 3.6 oz)-102.8 kg (226 lb 10.1 oz)] 102.8 kg (226 lb 10.1 oz) (02/09 0443) Weight change: -0.7 kg (-1 lb 8.7 oz)  Intake/Output from previous day: 02/08 0701 - 02/09 0700 In: -  Out: 2080  Intake/Output this shift:    General appearance: moderately obese and slowed mentation Resp: diminished breath sounds bilaterally Cardio: S1, S2 normal, systolic murmur: systolic ejection 2/6, decrescendo at 2nd left intercostal space and diastolic murmur: early diastolic 2/6, decrescendo at 2nd left intercostal space GI: pos bs, liver down 4 cm Extremities: AVF RUA, B&T Confused  Lab Results:  Recent Labs  02/16/12 0450 02/17/12 0600  WBC 6.8 7.2  HGB 9.6* 9.0*  HCT 31.7* 29.3*  PLT 213 197   BMET:  Recent Labs  02/15/12 0932 02/16/12 0450  NA 139 138  K 3.9 4.1  CL 96 96  CO2 33* 33*  GLUCOSE 89 137*  BUN 20 31*  CREATININE 5.18* 6.72*  CALCIUM 9.1 8.2*   No results found for this basename: PTH,  in the last 72 hours Iron Studies: No results found for this basename: IRON, TIBC, TRANSFERRIN, FERRITIN,  in the last 72 hours  Studies/Results: No results found.  I have reviewed the patient's current medications.  Assessment/Plan: 1 ESRD stable 2 DM controlled 3 PVD ?? Source of temp. Dropped Hb over 2 gm 4 Anemia bleed 5 Anemia of CKD epo 6 HPTH 7 Confusion ?? Hosp, pain meds, fever. P follow temp closely, if spikes, c&s and AB. Check labs in am    LOS: 2 days   Christyan Reger L 02/17/2012,7:58 AM

## 2012-02-17 NOTE — Progress Notes (Signed)
VASCULAR PROGRESS NOTE  SUBJECTIVE: No specific complaints.   PHYSICAL EXAM: Filed Vitals:   02/16/12 1719 02/16/12 2012 02/16/12 2113 02/17/12 0443  BP: 94/40 91/45 96/54  90/48  Pulse: 126 126  94  Temp: 98.4 F (36.9 C) 100 F (37.8 C)  99.7 F (37.6 C)  TempSrc: Oral Oral  Oral  Resp: 18 18  19   Height:      Weight:    226 lb 10.1 oz (102.8 kg)  SpO2: 96% 95%  97%   Lungs clear.  Dressing  Right BKA is dry. Wound looked fine yest.   LABS: Lab Results  Component Value Date   WBC 6.8 02/16/2012   HGB 9.6* 02/16/2012   HCT 31.7* 02/16/2012   MCV 93.2 02/16/2012   PLT 213 02/16/2012   Lab Results  Component Value Date   CREATININE 6.72* 02/16/2012   Lab Results  Component Value Date   INR 1.27 02/15/2012   CBG (last 3)   Recent Labs  02/16/12 0620 02/16/12 1711 02/16/12 2104  GLUCAP 128* 167* 146*    ASSESSMENT AND PLAN: 1. 2 Days Post-Op s/p: Right TMA. 2. No further bleeding. Today's CBC pend 3. Disposition: He was at home. He has a prosethesis; however, now with TMA on right will need to be PWB right heel only. I suspect that he will need more PTx before he can safely go home. PTx is following him.   Cari Caraway Beeper: [phone] 02/17/2012

## 2012-02-18 ENCOUNTER — Encounter (HOSPITAL_COMMUNITY): Payer: Self-pay | Admitting: Surgery

## 2012-02-18 LAB — COMPREHENSIVE METABOLIC PANEL
ALT: 8 U/L (ref 0–53)
Alkaline Phosphatase: 49 U/L (ref 39–117)
CO2: 28 mEq/L (ref 19–32)
Calcium: 8.6 mg/dL (ref 8.4–10.5)
Chloride: 97 mEq/L (ref 96–112)
GFR calc Af Amer: 7 mL/min — ABNORMAL LOW (ref >90)
GFR calc non Af Amer: 6 mL/min — ABNORMAL LOW (ref >90)
Glucose, Bld: 81 mg/dL (ref 70–99)
Sodium: 136 mEq/L (ref 135–145)
Total Bilirubin: 0.5 mg/dL (ref 0.3–1.2)

## 2012-02-18 LAB — GLUCOSE, CAPILLARY
Glucose-Capillary: 110 mg/dL — ABNORMAL HIGH (ref 70–99)
Glucose-Capillary: 168 mg/dL — ABNORMAL HIGH (ref 70–99)

## 2012-02-18 LAB — CBC
HCT: 30.4 % — ABNORMAL LOW (ref 39.0–52.0)
Hemoglobin: 9.6 g/dL — ABNORMAL LOW (ref 13.0–17.0)
MCHC: 31.6 g/dL (ref 30.0–36.0)
RBC: 3.3 MIL/uL — ABNORMAL LOW (ref 4.22–5.81)

## 2012-02-18 NOTE — Plan of Care (Signed)
Problem: Phase I Progression Outcomes Goal: OOB as tolerated unless otherwise ordered Outcome: Not Progressing Due to right foot pain, weakness and HR 127 sitting edge of bed.

## 2012-02-18 NOTE — Evaluation (Signed)
Physical Therapy Evaluation Patient Details Name: Alan [name] MRN: [id number] DOB: 1931/12/10 Today's Date: 02/18/2012 Time: 4098-1191 PT Time Calculation (min): 32 min  PT Assessment / Plan / Recommendation Clinical Impression  Patient admitted shortly after discharge home from inpatient rehab presents s/p right forefoot amputation and recent left BKA.  HE presents with decreased activity tolerance, decreased independence with mobility, decreased sitting balance and unable to perform transfers without +2 assist.  Feel he will benefit from skilled PT in the acute setting as well as SNF level rehab to allow eventual return home with wife assist.    PT Assessment  Patient needs continued PT services    Follow Up Recommendations  SNF                Equipment Recommendations  None recommended by PT       Frequency Min 3X/week    Precautions / Restrictions Precautions Precautions: Fall Required Braces or Orthoses: Other Brace/Splint Other Brace/Splint: Heel wedge Darco shoe in room Restrictions Weight Bearing Restrictions: Yes RLE Weight Bearing: Non weight bearing RLE Partial Weight Bearing Percentage or Pounds: could find orders for weight bearing status   Pertinent Vitals/Pain 6/10 in right foot; HR 127, BP 98/65 SpO2 97%      Mobility  Bed Mobility Bed Mobility: Supine to Sit;Sit to Supine;Rolling Left;Rolling Right;Sitting - Scoot to Edge of Bed Rolling Right: 5: Supervision;With rail Rolling Left: 5: Supervision;With rail Supine to Sit: 3: Mod assist Sitting - Scoot to Edge of Bed: 2: Max assist Sit to Supine: 3: Mod assist Details for Bed Mobility Assistance: cues and assist for technique, generalized weakness evident Transfers Sit to Stand: 2: Max assist;From bed;With upper extremity assist;From elevated surface Stand to Sit: 3: Mod assist Lateral/Scoot Transfers: 3: Mod assist (scooting along edge of bed to head of bed) Details for Transfer Assistance:  able to barely lift hips with attempts to stand (tried about 4 attempts with cues and assist for strengthening and tolerance to weight bearing)    Exercises     PT Diagnosis: Difficulty walking;Generalized weakness;Acute pain  PT Problem List: Decreased strength;Decreased activity tolerance;Decreased balance;Decreased mobility;Pain PT Treatment Interventions: DME instruction;Functional mobility training;Therapeutic activities;Therapeutic exercise;Wheelchair mobility training;Patient/family education;Balance training   PT Goals Acute Rehab PT Goals PT Goal Formulation: With patient Time For Goal Achievement: 03/03/12 Potential to Achieve Goals: Good Pt will go Supine/Side to Sit: with supervision;with rail PT Goal: Supine/Side to Sit - Progress: Goal set today Pt will Sit at Edge of Bed: with modified independence;with unilateral upper extremity support;6-10 min PT Goal: Sit at Edge Of Bed - Progress: Goal set today Pt will go Sit to Supine/Side: with supervision PT Goal: Sit to Supine/Side - Progress: Goal set today Pt will go Sit to Stand: with min assist;from elevated surface PT Goal: Sit to Stand - Progress: Goal set today Pt will go Stand to Sit: with min assist PT Goal: Stand to Sit - Progress: Goal set today Pt will Transfer Bed to Chair/Chair to Bed: with min assist PT Transfer Goal: Bed to Chair/Chair to Bed - Progress: Goal set today Pt will Stand: with min assist PT Goal: Stand - Progress: Goal set today Pt will Perform Home Exercise Program: with supervision, verbal cues required/provided PT Goal: Perform Home Exercise Program - Progress: Goal set today Pt will Propel Wheelchair: 51 - 150 feet;with supervision PT Goal: Propel Wheelchair - Progress: Goal set today  Visit Information  Last PT Received On: 02/18/12 Assistance Needed: +1  Subjective Data  Subjective: I feel weak.  Doctor said watch much weight on toes since was bleeding. Patient Stated Goal: To get  stronger   Prior Functioning  Home Living Lives With: Spouse Available Help at Discharge: Family;Available 24 hours/day Type of Home: House Home Access: Stairs to enter;Ramped entrance Home Layout: One level Bathroom Shower/Tub: Walk-in shower;Tub/shower unit Bathroom Toilet: Standard Home Adaptive Equipment: Bedside commode/3-in-1;Walker - rolling;Straight cane;Wheelchair - manual Prior Function Level of Independence: Needs assistance Needs Assistance: Bathing;Transfers Transfer Assistance: supervision to mod I Communication Communication: No difficulties    Cognition  Cognition Overall Cognitive Status: Appears within functional limits for tasks assessed/performed Arousal/Alertness: Awake/alert Orientation Level: Appears intact for tasks assessed Behavior During Session: [facility] for tasks performed Cognition - Other Comments: slow processing    Extremity/Trunk Assessment Right Lower Extremity Assessment RLE ROM/Strength/Tone: Deficits;Due to pain RLE ROM/Strength/Tone Deficits: hip flexion 4-/5, knee extension 4/5 (notes say transmet amputation, looks like [place]) Left Lower Extremity Assessment LLE ROM/Strength/Tone: Deficits LLE ROM/Strength/Tone Deficits: grossly 4-/5 strength in left BKA   Balance Static Sitting Balance Static Sitting - Level of Assistance: 5: Stand by assistance (with UE assist at edge of bed) Dynamic Sitting Balance Dynamic Sitting - Balance Support: Bilateral upper extremity supported (while scooting to head of bed) Dynamic Sitting - Level of Assistance: 4: Min assist  End of Session PT - End of Session Equipment Utilized During Treatment: Gait belt Activity Tolerance: Patient limited by fatigue;Treatment limited secondary to medical complications (Comment) (HR 127) Patient left: in bed;with bed alarm set;with call bell/phone within reach Nurse Communication: Mobility status  GP     [facility] 02/18/2012, 9:54 AM Sheran Lawless,  PT [phone] 02/18/2012

## 2012-02-18 NOTE — Progress Notes (Signed)
[facility]  Patient Status: Active (receiving services up to time of hospitalization)  [facility] is providing the following services: RN  If patient discharges after hours, please call [phone].   [facility] 02/18/2012, 10:18 AM

## 2012-02-18 NOTE — Progress Notes (Signed)
Subjective: Interval History: none  Objective: Vital signs in last 24 hours: Temp:  [98.1 F (36.7 C)-98.5 F (36.9 C)] 98.5 F (36.9 C) (02/09 2001) Pulse Rate:  [82-85] 85 (02/09 2155) Resp:  [18] 18 (02/09 2001) BP: (91-93)/(39-48) 92/48 mmHg (02/09 2155) SpO2:  [97 %-100 %] 100 % (02/09 2001) Weight change:   Intake/Output from previous day:   Intake/Output this shift:    General appearance: moderately obese and slowed mentation Resp: diminished breath sounds bilaterally, no rales Cardio: S1, S2 normal, systolic ejection 2/6, decrescendo at 2nd left intercostal space and diastolic murmur: early diastolic 2/6, decrescendo at 2nd left intercostal space GI: pos bs, liver down 4 cm Extremities: AVF RUA, B&T Mildly confused  Lab Results:  Recent Labs  02/17/12 0600 02/18/12 0515  WBC 7.2 8.0  HGB 9.0* 9.6*  HCT 29.3* 30.4*  PLT 197 212   BMET:   Recent Labs  02/16/12 0450 02/18/12 0515  NA 138 136  K 4.1 3.9  CL 96 97  CO2 33* 28  GLUCOSE 137* 81  BUN 31* 31*  CREATININE 6.72* 7.45*  CALCIUM 8.2* 8.6   No results found for this basename: PTH,  in the last 72 hours Iron Studies: No results found for this basename: IRON, TIBC, TRANSFERRIN, FERRITIN,  in the last 72 hours  Studies/Results: No results found.  I have reviewed the patient's current medications.  Dialysis Orders: [facility]  EDW 102.5 kg HD Bath 2.0 k, 2.25 ca Time 4 hrs 15 min Heparin 3,400units. Access Right upper arm avf BFR 500 DFR 800 Hectoral 4 mcg IV/HD Epogen 6000 Venofer 100mg  q weekly hd though 2/13; Last iPTH 351 and Hgb 10.6   Assessment/Plan: 1 ESRD stable 2 DM controlled 3 Confusion ?? Hosp, pain meds, fever 4 Low grade temp on vanc IV 5 Anemia of CKD epo 6 HPTH 7 PVD s/p R TMA  P HD tomorrow   LOS: 3 days   Kendrick Haapala D 02/18/2012,6:22 AM

## 2012-02-18 NOTE — Progress Notes (Signed)
[facility] Progress Note  02/18/2012 7:46 AM POD 3  Subjective:  No complaints  Afebrile  VSS 94% RA  Filed Vitals:   02/18/12 0700  BP: 113/51  Pulse: 96  Temp: 97.9 F (36.6 C)  Resp: 20    Physical Exam: Incisions:  Right foot is wrapped (re-wrapped this am by [name])   CBC    Component Value Date/Time   WBC 8.0 02/18/2012 0515   RBC 3.30* 02/18/2012 0515   HGB 9.6* 02/18/2012 0515   HCT 30.4* 02/18/2012 0515   PLT 212 02/18/2012 0515   MCV 92.1 02/18/2012 0515   MCH 29.1 02/18/2012 0515   MCHC 31.6 02/18/2012 0515   RDW 14.5 02/18/2012 0515   LYMPHSABS 1.4 01/22/2012 0640   MONOABS 1.2* 01/22/2012 0640   EOSABS 0.0 01/22/2012 0640   BASOSABS 0.0 01/22/2012 0640    BMET    Component Value Date/Time   NA 136 02/18/2012 0515   K 3.9 02/18/2012 0515   CL 97 02/18/2012 0515   CO2 28 02/18/2012 0515   GLUCOSE 81 02/18/2012 0515   BUN 31* 02/18/2012 0515   CREATININE 7.45* 02/18/2012 0515   CALCIUM 8.6 02/18/2012 0515   CALCIUM 8.8 10/27/2010 0734   GFRNONAA 6* 02/18/2012 0515   GFRAA 7* 02/18/2012 0515    INR    Component Value Date/Time   INR 1.27 02/15/2012 0932   INR 3.18 02/06/2011    No intake or output data in the 24 hours ending 02/18/12 0746   Assessment/Plan:  79 y.o. male is s/p:  Right transmetatarsal amputation   POD 3  -acute surgical blood loss anemia is stable -pt doing well. -wound checked this am by [name] and dressing changed. -minimal weight bearing to allow foot to heal.   Doreatha Massed, PA-C [facility] [phone] 02/18/2012 7:46 AM  Dressing was changed by myself this morning. Her wound looks healthy. I placed a wet-to-dry dressing over the lateral malleolar ulcer. This will also need aggressive wound care. The patient has not walked. He will need evaluation by physical therapy prior to discharge. He wishes to go home, however I'm not sure if this is the best option for him.  Physical  therapy for evaluation will help [place] make this determination.   Durene Cal

## 2012-02-18 NOTE — Care Management Note (Signed)
    Page 1 of 1   02/20/2012     1:06:05 PM   CARE MANAGEMENT NOTE 02/20/2012  Patient:  Alan [name], Alan [name]   Account Number:  [id number]  Date Initiated:  02/18/2012  Documentation initiated by:  Hassen Bruun  Subjective/Objective Assessment:   PT S/P PARTIAL RT FOOT AMPUTATION ON 02/15/12.  PTA, PT RESIDES AT HOME WITH SPOUSE.     Action/Plan:   PT WILL LIKELY NEED SNF AT DC.  WILL CONSULT CSW TO FACILTATE DC TO SNF FOR REHAB PRIOR TO DC HOME.  [facility] WILL NOT ADMIT.   Anticipated DC Date:  02/19/2012   Anticipated DC Plan:  SKILLED NURSING FACILITY  In-house referral  Clinical Social Worker         Choice offered to / List presented to:             Status of service:  Completed, signed off Medicare Important Message given?   (If response is "NO", the following Medicare IM given date fields will be blank) Date Medicare IM given:   Date Additional Medicare IM given:    Discharge Disposition:  SKILLED NURSING FACILITY  Per UR Regulation:  Reviewed for med. necessity/level of care/duration of stay  If discussed at Long Length of Stay Meetings, dates discussed:    Comments:  02/20/12 Lamir Racca,RN,BSN [phone] PT DISCHARGED TO SNF TODAY, PER CSW ARRANGEMENTS.

## 2012-02-18 NOTE — Consult Note (Signed)
Physical Medicine and Rehabilitation Consult Reason for Consult: Right transmetatarsal amputation Referring Physician: [name]   HPI: Alan [name] is a 79 y.o. 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 Patient with recent discharge from inpatient rehabilitation services 02/06/2012 after right second toe amputation due to gangrenous changes. Noted poor healing of site patient was discharged to home and followup with vascular surgery in relation to recent right toe amputation. Patient follow up with vascular surgery as an outpatient and no change with conservative care after recent surgery of right second toe with poor healing and elected surgical intervention. Underwent right transmetatarsal amputation 02/15/2012 per [name]. Postoperative pain management. Await plan to resume chronic Coumadin therapy for ache or fibrillation. Renal service followup with dialysis ongoing. Await formal physical and occupational therapy evaluation. M.D. is requested physical medicine rehabilitation consult to consider inpatient rehabilitation services  Review of Systems  Respiratory: Positive for cough.   Cardiovascular: Positive for palpitations and leg swelling.  Gastrointestinal: Positive for constipation.  Musculoskeletal: Positive for myalgias.  Neurological: Positive for weakness.  All other systems reviewed and are negative.   Past Medical History  Diagnosis Date  . Diabetes mellitus   . Gout   . Hypertension   . Hyperlipidemia   . Arthritis   . Prostate cancer     s/p seed implant  . Cataract     BILATERAL-BEEN REMOVED  . Neuromuscular disorder     DABETIC NEUROPATHY-LOWER EXTREMITY  . Atrial fibrillation     on coumadin  . Aortic stenosis     moderate  to severe, not felt to be a surgical candidate  . Anemia     secondary to end stage renal disease  . Coronary artery disease     remote CABG in 1996; last stress test in 2005; last cath 2005  . Diabetic neuropathy   . Edema   . PAD (peripheral artery disease)   . Diabetic retinopathy(362.0)   . Chronic anticoagulation     on coumadin  . Shortness of breath   . Chronic kidney disease     Started dialysis October 2012  . Nephrolithiasis     prior history of percutaneous nephrostomy  . Myocardial infarction     1999 ish   Past Surgical History  Procedure Laterality Date  . Ventral hernia repair    . Inguinal hernia repair      right side  . Colonoscopy    . Kidney stone surgery    . Polypectomy    . Tonsillectomy    . Toe surgery      removal little toe right foot  . Cardiac catheterization  04/19/2003    SEVERE 2 VESSEL OBSTRUCTIVE ATHERSCLEROTIC CAD  . Coronary artery bypass graft  1996    LIMA GRAFT TO THE LAD, SEQUENTIAL SAPHENOUS  VEIN GRAFT TO THE FIRST AND SECOND DIAGONIAL BRANCHES, SEQUENTIAL SAPHENOUS VEIN GRAFT TO THE ACUTE MARGINAL, POSTERIOR DESCENDING, AND POSTERIOR LATERAL BRANCHES OF THE RIGHT CORONARY ARTERY  . Radioactive seed implant    . US echocardiography  09/06/2009    EF 55-60%  . Cardiovascular stress test  04/13/2003    EF 54%. EVIDENCE OF ANTERO-APICAL ISCHEMIA. NORMAL LV SIZE AND FUNCTION  . Tee without cardioversion  05/09/2011    Procedure: TRANSESOPHAGEAL ECHOCARDIOGRAM (TEE);  Surgeon: Laurey Morale, MD;  Location: [facility]  ENDOSCOPY;  Service: Cardiovascular;  Laterality: N/A;  . Hernia repair    . Amputation  07/20/2011    Procedure: AMPUTATION BELOW KNEE;  Surgeon: Nada Libman, MD;  Location: [facility] OR;  Service: Vascular;  Laterality: Left;  . Amputation  01/25/2012    Procedure: AMPUTATION DIGIT;  Surgeon: Chuck Hint, MD;  Location: [facility] OR;  Service: Vascular;  Laterality: Right;  second  . Av fistula placement      right   Family  History  Problem Relation Age of Onset  . Heart disease Father   . Hypertension Brother   . Stroke Brother   . Diabetes Mother     Pt. not sure, but he thinks she did.   Social History:  reports that he quit smoking about 43 years ago. His smoking use included Cigarettes. He smoked 0.00 packs per day for 2 years. He has quit using smokeless tobacco. His smokeless tobacco use included Chew. He reports that he does not drink alcohol or use illicit drugs. Allergies:  Allergies  Allergen Reactions  . Ancef (Cefazolin) Rash    Patient cant recall if allergy to this med exists.    Medications Prior to Admission  Medication Sig Dispense Refill  . allopurinol (ZYLOPRIM) 300 MG tablet Take 0.5 tablets (150 mg total) by mouth daily.  30 tablet  1  . aspirin EC 81 MG tablet Take 1 tablet (81 mg total) by mouth daily.      . calcium carbonate (TUMS - DOSED IN MG ELEMENTAL CALCIUM) 500 MG chewable tablet Chew 1 tablet by mouth 3 (three) times daily with meals. Phosphorous binder      . estazolam (PROSOM) 2 MG tablet Take 1 mg by mouth at bedtime as needed. For sleep      . glimepiride (AMARYL) 4 MG tablet Take 2 mg by mouth daily with breakfast.      . ibuprofen (ADVIL,MOTRIN) 200 MG tablet Take 200 mg by mouth every 6 (six) hours as needed. For pain      . metoprolol tartrate (LOPRESSOR) 25 MG tablet Take 37.5 mg by mouth 2 (two) times daily. Takes 1.5 tablets twice daily      . simvastatin (ZOCOR) 40 MG tablet Take 1 tablet (40 mg total) by mouth at bedtime.  30 tablet  1  . warfarin (COUMADIN) 5 MG tablet Take 5 mg by mouth See admin instructions. Patient takes 1 tablet every day, except Friday-takes 1/2 tablet.        Home:    Functional History:   Functional Status:  Mobility:          ADL:    Cognition: Cognition Orientation Level: Oriented X4    Blood pressure 92/48, pulse 85, temperature 98.5 F (36.9 C), temperature source Oral, resp. rate 18, height 6' 0.05" (1.83 m),  weight 102.8 kg (226 lb 10.1 oz), SpO2 100.00%. Physical Exam  Vitals reviewed. HENT:  Head: Normocephalic.  Eyes:  Pupils round and reactive to light  Neck: Neck supple. No thyromegaly present.  Cardiovascular:  Cardiac rate controlled  Pulmonary/Chest: Breath sounds normal. No respiratory distress.  Abdominal: Soft. Bowel sounds are normal. He exhibits no distension.  Neurological:  Patient is alert with flat affect. He needed some cues to name situation. He was able to name place and date of birth. He follows simple commands  Skin:  Right transmetatarsal amputation site dressed. Left amputation site well-healed    Results for orders placed during the hospital encounter of 02/15/12 (from  the past 24 hour(s))  GLUCOSE, CAPILLARY     Status: Abnormal   Collection Time    02/17/12  6:07 AM      Result Value Range   Glucose-Capillary 109 (*) 70 - 99 mg/dL   Comment 1 Documented in Chart     Comment 2 Notify RN    GLUCOSE, CAPILLARY     Status: Abnormal   Collection Time    02/17/12 11:10 AM      Result Value Range   Glucose-Capillary 161 (*) 70 - 99 mg/dL   Comment 1 Notify RN     Comment 2 Documented in Chart    GLUCOSE, CAPILLARY     Status: Abnormal   Collection Time    02/17/12  4:09 PM      Result Value Range   Glucose-Capillary 191 (*) 70 - 99 mg/dL   Comment 1 Notify RN     Comment 2 Documented in Chart    GLUCOSE, CAPILLARY     Status: Abnormal   Collection Time    02/17/12  9:53 PM      Result Value Range   Glucose-Capillary 109 (*) 70 - 99 mg/dL   Comment 1 Documented in Chart     Comment 2 Notify RN    CBC     Status: Abnormal   Collection Time    02/18/12  5:15 AM      Result Value Range   WBC 8.0  4.0 - 10.5 K/uL   RBC 3.30 (*) 4.22 - 5.81 MIL/uL   Hemoglobin 9.6 (*) 13.0 - 17.0 g/dL   HCT 16.1 (*) 09.6 - 04.5 %   MCV 92.1  78.0 - 100.0 fL   MCH 29.1  26.0 - 34.0 pg   MCHC 31.6  30.0 - 36.0 g/dL   RDW 40.9  81.1 - 91.4 %   Platelets 212  150 - 400  K/uL   No results found.  Assessment/Plan: Diagnosis: right TMA   Comment: This patient had reached a supervision level after recent discharge from rehab prior to this TMA. Honestly, he did not want to be in CIR the first time. Was essentially NWB when he left [place]. At this point I recommend HH (or SNF if family can't manage) upon discharge from the hospital.  Ranelle Oyster, MD, [facility]     02/18/2012

## 2012-02-19 LAB — RENAL FUNCTION PANEL
Albumin: 2.3 g/dL — ABNORMAL LOW (ref 3.5–5.2)
BUN: 45 mg/dL — ABNORMAL HIGH (ref 6–23)
Calcium: 8.5 mg/dL (ref 8.4–10.5)
Phosphorus: 4.3 mg/dL (ref 2.3–4.6)
Potassium: 4.1 mEq/L (ref 3.5–5.1)
Sodium: 133 mEq/L — ABNORMAL LOW (ref 135–145)

## 2012-02-19 LAB — CBC
MCHC: 31.8 g/dL (ref 30.0–36.0)
MCV: 90.1 fL (ref 78.0–100.0)
Platelets: 221 10*3/uL (ref 150–400)
RDW: 14.5 % (ref 11.5–15.5)
WBC: 9.4 10*3/uL (ref 4.0–10.5)

## 2012-02-19 LAB — GLUCOSE, CAPILLARY
Glucose-Capillary: 99 mg/dL (ref 70–99)
Glucose-Capillary: 172 mg/dL — ABNORMAL HIGH (ref 70–99)

## 2012-02-19 MED ORDER — WARFARIN SODIUM 7.5 MG PO TABS
7.5000 mg | ORAL_TABLET | Freq: Once | ORAL | Status: AC
  Administered 2012-02-19: 7.5 mg via ORAL
  Filled 2012-02-19: qty 1

## 2012-02-19 MED ORDER — HEPARIN (PORCINE) IN NACL 100-0.45 UNIT/ML-% IJ SOLN
2000.0000 [IU]/h | INTRAMUSCULAR | Status: DC
  Administered 2012-02-19: 1750 [IU]/h via INTRAVENOUS
  Administered 2012-02-19: 1350 [IU]/h via INTRAVENOUS
  Filled 2012-02-19 (×4): qty 250

## 2012-02-19 MED ORDER — WARFARIN - PHARMACIST DOSING INPATIENT
Freq: Every day | Status: DC
  Administered 2012-02-19: 18:00:00

## 2012-02-19 NOTE — Progress Notes (Signed)
ANTICOAGULATION CONSULT NOTE - Initial Consult  Pharmacy Consult for Heparin Indication: atrial fibrillation  Allergies  Allergen Reactions  . Ancef (Cefazolin) Rash    Patient cant recall if allergy to this med exists.     Patient Measurements: Height: 6' 0.05" (183 cm) Weight: 222 lb 10.6 oz (101 kg) IBW/kg (Calculated) : 77.71 Heparin Dosing Weight: 98 kg  Vital Signs: Temp: 99.1 F (37.3 C) (02/11 2057) Temp src: Oral (02/11 2057) BP: 101/46 mmHg (02/11 2057) Pulse Rate: 95 (02/11 2057)  Labs:  Recent Labs  02/17/12 0600 02/18/12 0515 02/19/12 1244 02/19/12 2126  HGB 9.0* 9.6* 9.0*  --   HCT 29.3* 30.4* 28.3*  --   PLT 197 212 221  --   HEPARINUNFRC  --   --   --  <0.10*  CREATININE  --  7.45* 9.73*  --     Estimated Creatinine Clearance: 7.7 ml/min (by C-G formula based on Cr of 9.73).   Medical History: Past Medical History  Diagnosis Date  . Diabetes mellitus   . Gout   . Hypertension   . Hyperlipidemia   . Arthritis   . Prostate cancer     s/p seed implant  . Cataract     BILATERAL-BEEN REMOVED  . Neuromuscular disorder     DABETIC NEUROPATHY-LOWER EXTREMITY  . Atrial fibrillation     on coumadin  . Aortic stenosis     moderate to severe, not felt to be a surgical candidate  . Anemia     secondary to end stage renal disease  . Coronary artery disease     remote CABG in 1996; last stress test in 2005; last cath 2005  . Diabetic neuropathy   . Edema   . PAD (peripheral artery disease)   . Diabetic retinopathy(362.0)   . Chronic anticoagulation     on coumadin  . Shortness of breath   . Chronic kidney disease     Started dialysis October 2012  . Nephrolithiasis     prior history of percutaneous nephrostomy  . Myocardial infarction     1999 ish    Medications:  Prescriptions prior to admission  Medication Sig Dispense Refill  . allopurinol (ZYLOPRIM) 300 MG tablet Take 0.5 tablets (150 mg total) by mouth daily.  30 tablet  1  .  aspirin EC 81 MG tablet Take 1 tablet (81 mg total) by mouth daily.      . calcium carbonate (TUMS - DOSED IN MG ELEMENTAL CALCIUM) 500 MG chewable tablet Chew 1 tablet by mouth 3 (three) times daily with meals. Phosphorous binder      . estazolam (PROSOM) 2 MG tablet Take 1 mg by mouth at bedtime as needed. For sleep      . glimepiride (AMARYL) 4 MG tablet Take 2 mg by mouth daily with breakfast.      . ibuprofen (ADVIL,MOTRIN) 200 MG tablet Take 200 mg by mouth every 6 (six) hours as needed. For pain      . metoprolol tartrate (LOPRESSOR) 25 MG tablet Take 37.5 mg by mouth 2 (two) times daily. Takes 1.5 tablets twice daily      . simvastatin (ZOCOR) 40 MG tablet Take 1 tablet (40 mg total) by mouth at bedtime.  30 tablet  1  . warfarin (COUMADIN) 5 MG tablet Take 5 mg by mouth See admin instructions. Patient takes 1 tablet every day, except Friday-takes 1/2 tablet.        Assessment: [name] is a 79 yo  M who is s/p transmetatarsal amputation of his R foot on 02/15/12.  He was on Coumadin PTA for hx of aifb and anticoagulation has been held since early February in preparation for the procedure.  Pt has minimal bleeding at surgical site and [facility] has asked that pharmacy restart Coumadin with Heparin bridging for his hx afib. FIrst xa level is undetectable, will adjust dose.  Home Coumadin dose = 5mg  daily except 2.5 mg on Fridays.  Goal of Therapy:  INR 2-3 Heparin level 0.3-0.7 units/ml Monitor platelets by anticoagulation protocol: Yes   Plan:  Increase heparin gtt to 1750 units/hr and f/u am labs.  Verlene Mayer, PharmD, BCPS Pager [phone] 02/19/2012 10:15 PM

## 2012-02-19 NOTE — Progress Notes (Signed)
Subjective: Fully oriented, no complaints  Objective: Vital signs in last 24 hours: Temp:  [97.9 F (36.6 C)-99.6 F (37.6 C)] 98.4 F (36.9 C) (02/11 0445) Pulse Rate:  [82-92] 84 (02/11 0900) Resp:  [19] 19 (02/11 0445) BP: (98-118)/(40-72) 118/72 mmHg (02/11 0900) SpO2:  [99 %-100 %] 100 % (02/11 0900) Weight change:   Intake/Output from previous day: 02/10 0701 - 02/11 0700 In: 240 [P.O.:240] Out: -  Intake/Output this shift: Total I/O In: 240 [P.O.:240] Out: -   Lab Results:  Recent Labs  02/17/12 0600 02/18/12 0515  WBC 7.2 8.0  HGB 9.0* 9.6*  HCT 29.3* 30.4*  PLT 197 212   BMET:   Recent Labs  02/18/12 0515  NA 136  K 3.9  CL 97  CO2 28  GLUCOSE 81  BUN 31*  CREATININE 7.45*  CALCIUM 8.6   No results found for this basename: PTH,  in the last 72 hours Iron Studies: No results found for this basename: IRON, TIBC, TRANSFERRIN, FERRITIN,  in the last 72 hours  Studies/Results: No results found.  I have reviewed the patient's current medications.  Exam: General appearance: moderately obese and slowed mentation Resp: diminished breath sounds bilaterally, no rales Cardio: S1, S2 normal, systolic ejection 2/6, decrescendo at 2nd left intercostal space and diastolic murmur: early diastolic 2/6, decrescendo at 2nd left intercostal space GI: pos bs, liver down 4 cm Extremities: AVF RUA, BVT; R foot TMA wrapped, no edema Mildly confused  Dialysis Orders: [facility]TS  EDW 102.5 kg HD Bath 2.0 k, 2.25 ca Time 4 hrs 15 min Heparin 3,400units. Access Right upper arm avf BFR 500 DFR 800 Hectoral 4 mcg IV/HD Epogen 6000 Venofer 100mg  q weekly hd though 2/13; Last iPTH 351 and Hgb 10.6   Assessment/Plan: 1 ESRD stable, TTS hd 2 DM controlled 3 AMS/low grade fever- MS better, on IV vanc 4 s/p R TMA on 2/7 per Dr Myra Gianotti 5 Anemia of CKD epo 6 HPTH 7 Dispo- for SNF vs home with PT, per primary    LOS: 4 days   Ivonne Freeburg D 02/19/2012,11:12  AM

## 2012-02-19 NOTE — Progress Notes (Signed)
Physical Therapy Treatment Patient Details Name: Alan [name] MRN: [id number] DOB: Jul 24, 1931 Today's Date: 02/19/2012 Time: 4098-1191 PT Time Calculation (min): 27 min  PT Assessment / Plan / Recommendation Comments on Treatment Session  Patient still unable to perform transfer to chair today with walker for stand pivot and without for squat pivot due to fear and decreased anterior weight shift despite the addition of second person to help.  Still feel he will be more successful with lateral scoot transfers when able to get chair with removeable armrest.    Follow Up Recommendations  SNF           Equipment Recommendations  Other (comment)       Frequency Min 3X/week   Plan Discharge plan remains appropriate    Precautions / Restrictions Precautions Precautions: Fall Required Braces or Orthoses: Other Brace/Splint Other Brace/Splint: Heel wedge Darco shoe in room Restrictions Weight Bearing Restrictions: Yes RLE Weight Bearing: Non weight bearing RLE Partial Weight Bearing Percentage or Pounds: could find orders for weight bearing status   Pertinent Vitals/Pain No c/o pain    Mobility  Bed Mobility Bed Mobility: Rolling Right;Right Sidelying to Sit Rolling Right: 4: Min assist;With rail Right Sidelying to Sit: 3: Mod assist;With rails;HOB flat Sitting - Scoot to Edge of Bed: 4: Min assist Sit to Supine: 4: Min assist;HOB flat Details for Bed Mobility Assistance: cues for technique for side to sit with UE assist at edge of bed, cues for sit to supine Transfers Sit to Stand: 1: +2 Total assist;From bed;With upper extremity assist;From elevated surface Sit to Stand: Patient Percentage: 20% Squat Pivot Transfers: 1: +2 Total assist;From elevated surface Squat Pivot Transfers: Patient Percentage: 10% Lateral/Scoot Transfers: 3: Mod assist Details for Transfer Assistance: attempted to stand x 2 with +2 assist and unable due to right LE and UE weakness; attempted  squat pivot with +2 and patient unable with cues and assist to get anterior weight shift sufficient to perform pivot transfer safely; lateral scoot to head of bed along edge of bed with mod assist to pull pad as pt scooted with weight on right LE.    Exercises Amputee Exercises Towel Squeeze: AROM;Both;10 reps;Supine Knee Extension: AROM;Right;10 reps;Supine Straight Leg Raises: AROM;Left;10 reps;Supine    PT Goals Acute Rehab PT Goals Pt will go Supine/Side to Sit: with supervision;with rail PT Goal: Supine/Side to Sit - Progress: Progressing toward goal Pt will Sit at [facility] of Bed: with modified independence;with unilateral upper extremity support;6-10 min PT Goal: Sit at Edge Of Bed - Progress: Progressing toward goal Pt will go Sit to Supine/Side: with supervision PT Goal: Sit to Supine/Side - Progress: Progressing toward goal Pt will go Sit to Stand: with min assist;from elevated surface PT Goal: Sit to Stand - Progress: Progressing toward goal Pt will Transfer Bed to Chair/Chair to Bed: with min assist PT Transfer Goal: Bed to Chair/Chair to Bed - Progress: Not progressing Pt will Perform Home Exercise Program: with supervision, verbal cues required/provided PT Goal: Perform Home Exercise Program - Progress: Progressing toward goal  Visit Information  Last PT Received On: 02/19/12 Assistance Needed: +2    Subjective Data  Subjective: Sorry I can't do what you want.   Cognition  Cognition Overall Cognitive Status: Appears within functional limits for tasks assessed/performed Arousal/Alertness: Awake/alert Orientation Level: Appears intact for tasks assessed Behavior During Session: [facility] for tasks performed    Balance  Static Sitting Balance Static Sitting - Level of Assistance: 5: Stand by assistance (with one vs  two UE support) Dynamic Sitting Balance Dynamic Sitting - Balance Support: Feet supported;Bilateral upper extremity supported;During functional activity Dynamic  Sitting - Level of Assistance: 5: Stand by assistance  End of Session PT - End of Session Equipment Utilized During Treatment: Gait belt Activity Tolerance: Patient limited by fatigue Patient left: in bed;with bed alarm set;with call bell/phone within reach Nurse Communication: Mobility status   GP     [facility] 02/19/2012, 10:11 AM Sheran Lawless, PT [phone] 02/19/2012

## 2012-02-19 NOTE — Progress Notes (Signed)
ANTICOAGULATION CONSULT NOTE - Initial Consult  Pharmacy Consult for Heparin/Coumadin Indication: atrial fibrillation  Allergies  Allergen Reactions  . Ancef (Cefazolin) Rash    Patient cant recall if allergy to this med exists.     Patient Measurements: Height: 6' 0.05" (183 cm) Weight: 225 lb 8.5 oz (102.3 kg) IBW/kg (Calculated) : 77.71 Heparin Dosing Weight: 98 kg  Vital Signs: Temp: 98.4 F (36.9 C) (02/11 0445) Temp src: Oral (02/11 0445) BP: 118/72 mmHg (02/11 0900) Pulse Rate: 84 (02/11 0900)  Labs:  Recent Labs  02/17/12 0600 02/18/12 0515  HGB 9.0* 9.6*  HCT 29.3* 30.4*  PLT 197 212  CREATININE  --  7.45*    Estimated Creatinine Clearance: 10.1 ml/min (by C-G formula based on Cr of 7.45).   Medical History: Past Medical History  Diagnosis Date  . Diabetes mellitus   . Gout   . Hypertension   . Hyperlipidemia   . Arthritis   . Prostate cancer     s/p seed implant  . Cataract     BILATERAL-BEEN REMOVED  . Neuromuscular disorder     DABETIC NEUROPATHY-LOWER EXTREMITY  . Atrial fibrillation     on coumadin  . Aortic stenosis     moderate to severe, not felt to be a surgical candidate  . Anemia     secondary to end stage renal disease  . Coronary artery disease     remote CABG in 1996; last stress test in 2005; last cath 2005  . Diabetic neuropathy   . Edema   . PAD (peripheral artery disease)   . Diabetic retinopathy(362.0)   . Chronic anticoagulation     on coumadin  . Shortness of breath   . Chronic kidney disease     Started dialysis October 2012  . Nephrolithiasis     prior history of percutaneous nephrostomy  . Myocardial infarction     1999 ish    Medications:  Prescriptions prior to admission  Medication Sig Dispense Refill  . allopurinol (ZYLOPRIM) 300 MG tablet Take 0.5 tablets (150 mg total) by mouth daily.  30 tablet  1  . aspirin EC 81 MG tablet Take 1 tablet (81 mg total) by mouth daily.      . calcium carbonate (TUMS  - DOSED IN MG ELEMENTAL CALCIUM) 500 MG chewable tablet Chew 1 tablet by mouth 3 (three) times daily with meals. Phosphorous binder      . estazolam (PROSOM) 2 MG tablet Take 1 mg by mouth at bedtime as needed. For sleep      . glimepiride (AMARYL) 4 MG tablet Take 2 mg by mouth daily with breakfast.      . ibuprofen (ADVIL,MOTRIN) 200 MG tablet Take 200 mg by mouth every 6 (six) hours as needed. For pain      . metoprolol tartrate (LOPRESSOR) 25 MG tablet Take 37.5 mg by mouth 2 (two) times daily. Takes 1.5 tablets twice daily      . simvastatin (ZOCOR) 40 MG tablet Take 1 tablet (40 mg total) by mouth at bedtime.  30 tablet  1  . warfarin (COUMADIN) 5 MG tablet Take 5 mg by mouth See admin instructions. Patient takes 1 tablet every day, except Friday-takes 1/2 tablet.        Assessment: Alan [name] is a 79 yo M who is s/p transmetatarsal amputation of his R foot on 02/15/12.  He was on Coumadin PTA for hx of aifb and anticoagulation has been held since early February in preparation  for the procedure.  Pt has minimal bleeding at surgical site and [facility]VS has asked that pharmacy restart Coumadin with Heparin bridging for his hx afib.  Home Coumadin dose = 5mg  daily except 2.5 mg on Fridays.  Goal of Therapy:  INR 2-3 Heparin level 0.3-0.7 units/ml Monitor platelets by anticoagulation protocol: Yes   Plan:  Heparin infusion at 1350 units/hr. Coumadin 7.5 mg PO x 1 tonight. Heparin level 8 hours after infusion starts. Heparin level, CBC, and INR daily.   [employer], Pharm.D., BCPS Clinical Pharmacist Pager [phone] 02/19/2012 12:25 PM

## 2012-02-19 NOTE — Progress Notes (Signed)
OT Cancellation Note  Patient Details Name: Alan [name] MRN: [id number] DOB: 1931-07-18   Cancelled Treatment:    Reason Eval/Treat Not Completed: Fatigue/lethargy limiting ability to participate.  Pt just finished with PT session and returning to supine in bed due to fatigue.  Will re-attempt as time allows later today.  02/19/2012 Cipriano Mile OTR/L Pager [phone] Office [phone]

## 2012-02-19 NOTE — Progress Notes (Addendum)
[facility] Progress Note  02/19/2012 7:25 AM POD 4  Subjective:  No complaints  Tm 99.6 now afebrile  Filed Vitals:   02/19/12 0521  BP: 108/50  Pulse:   Temp:   Resp:     Physical Exam: Incisions:  There is some separation at the lateral aspect of incision and smaller aspect on medial side.; sutures are in tact; minimal bleeding now   CBC    Component Value Date/Time   WBC 8.0 02/18/2012 0515   RBC 3.30* 02/18/2012 0515   HGB 9.6* 02/18/2012 0515   HCT 30.4* 02/18/2012 0515   PLT 212 02/18/2012 0515   MCV 92.1 02/18/2012 0515   MCH 29.1 02/18/2012 0515   MCHC 31.6 02/18/2012 0515   RDW 14.5 02/18/2012 0515   LYMPHSABS 1.4 01/22/2012 0640   MONOABS 1.2* 01/22/2012 0640   EOSABS 0.0 01/22/2012 0640   BASOSABS 0.0 01/22/2012 0640    BMET    Component Value Date/Time   NA 136 02/18/2012 0515   K 3.9 02/18/2012 0515   CL 97 02/18/2012 0515   CO2 28 02/18/2012 0515   GLUCOSE 81 02/18/2012 0515   BUN 31* 02/18/2012 0515   CREATININE 7.45* 02/18/2012 0515   CALCIUM 8.6 02/18/2012 0515   CALCIUM 8.8 10/27/2010 0734   GFRNONAA 6* 02/18/2012 0515   GFRAA 7* 02/18/2012 0515    INR    Component Value Date/Time   INR 1.27 02/15/2012 0932   INR 3.18 02/06/2011     Intake/Output Summary (Last 24 hours) at 02/19/12 0725 Last data filed at 02/18/12 0730  Gross per 24 hour  Intake    240 ml  Output      0 ml  Net    240 ml     Assessment/Plan:  79 y.o. male is s/p:  Right transmetatarsal amputation    POD 4  -pt was on coumadin-will d/w [name] when to restart.  If no bleeding issues, most likely today. -continue to watch incision as there is some separation at sutures laterally and a smaller portion medially. -wet to dry placed on lateral malleolus wound. -PT recommends SNF-will consult social work   Doreatha Massed, [place] [facility] [phone] 02/19/2012 7:25 AM    Agree with above Start heparin/coumadin  Durene Cal

## 2012-02-20 ENCOUNTER — Telehealth: Payer: Self-pay | Admitting: Surgery

## 2012-02-20 LAB — CBC
HCT: 31.2 % — ABNORMAL LOW (ref 39.0–52.0)
Hemoglobin: 9.7 g/dL — ABNORMAL LOW (ref 13.0–17.0)
MCV: 90.2 fL (ref 78.0–100.0)
RBC: 3.46 MIL/uL — ABNORMAL LOW (ref 4.22–5.81)
RDW: 14.4 % (ref 11.5–15.5)
WBC: 8.2 10*3/uL (ref 4.0–10.5)

## 2012-02-20 LAB — GLUCOSE, CAPILLARY: Glucose-Capillary: 115 mg/dL — ABNORMAL HIGH (ref 70–99)

## 2012-02-20 LAB — PROTIME-INR: INR: 1.33 (ref 0.00–1.49)

## 2012-02-20 MED ORDER — ENOXAPARIN SODIUM 100 MG/ML ~~LOC~~ SOLN: 100.0000 mg | Freq: Every day | SUBCUTANEOUS | Status: DC

## 2012-02-20 MED ORDER — WARFARIN SODIUM 7.5 MG PO TABS
7.5000 mg | ORAL_TABLET | Freq: Once | ORAL | Status: DC
  Filled 2012-02-20: qty 1

## 2012-02-20 MED ORDER — OXYCODONE HCL 5 MG PO TABS: 5.0000 mg | ORAL_TABLET | Freq: Four times a day (QID) | ORAL | Status: DC | PRN

## 2012-02-20 NOTE — Clinical Social Work Note (Signed)
CSW confirmed snf bed at [facility]. Pt is medically cleared for dc. Pt's wife will meet Pt at SNF weather pending. CSW sent dc info to snf and contacted [facility] to notify them of pt's return to dialysis tomorrow.   Non-emergency ambulance for transfer.   Frederico Hamman, LCSW [phone]

## 2012-02-20 NOTE — Clinical Social Work Placement (Signed)
Clinical Social Work Department CLINICAL SOCIAL WORK PLACEMENT NOTE 02/20/2012  Patient:  Alan [name], Alan [name]  Account Number:  [id number] Admit date:  02/15/2012  Clinical Social Worker:  Frederico Hamman, LCSW  Date/time:  02/20/2012 12:00 M  Clinical Social Work is seeking post-discharge placement for this patient at the following level of care:   SKILLED NURSING   (*CSW will update this form in Epic as items are completed)   02/19/2012  Patient/family provided with [facility] Department of Clinical Social Work's list of facilities offering this level of care within the geographic area requested by the patient (or if unable, by the patient's family).  02/19/2012  Patient/family informed of their freedom to choose among providers that offer the needed level of care, that participate in Medicare, Medicaid or managed care program needed by the patient, have an available bed and are willing to accept the patient.  02/19/2012  Patient/family informed of [facility]' ownership interest in [facility], as well as of the fact that they are under no obligation to receive care at this facility.  PASARR submitted to EDS on 02/19/2012 PASARR number received from EDS on 02/19/2012  FL2 transmitted to all facilities in geographic area requested by pt/family on  02/19/2012 FL2 transmitted to all facilities within larger geographic area on   Patient informed that his/her managed care company has contracts with or will negotiate with  certain facilities, including the following:     Patient/family informed of bed offers received:  02/20/2012 Patient chooses bed at [facility] [place] Physician recommends and patient chooses bed at    Patient to be transferred to [facility], [place] on  02/20/2012 Patient to be transferred to facility by [facility]  The following physician request were entered in Epic:   Additional Comments:  Frederico Hamman, LCSW [phone]

## 2012-02-20 NOTE — Clinical Social Work Psychosocial (Signed)
Late entry 02/20/12 for 02/19/12  Clinical Social Work Department BRIEF PSYCHOSOCIAL ASSESSMENT 02/20/2012  Patient:  Alan [name], Alan [name]     Account Number:  [id number]     Admit date:  02/15/2012  Clinical Social Worker:  Thomasene Mohair  Date/Time:  02/19/2012 11:30 AM  Referred by:  RN  Date Referred:  02/19/2012 Referred for  SNF Placement   Other Referral:   Interview type:  Patient Other interview type:   wife via phone    PSYCHOSOCIAL DATA Living Status:  FAMILY Admitted from facility:   Level of care:   Primary support name:  Alan [name] Primary support relationship to patient:  SPOUSE Degree of support available:   adequate supervision and emotional, not physically able to help pt.    CURRENT CONCERNS Current Concerns  Post-Acute Placement  Knowledge/Cognitive Deficit   Other Concerns:    SOCIAL WORK ASSESSMENT / PLAN CSW was referred to Pt to assist with dc planning. Pt lives with wife and her son. CSW met with Pt at bedside who was alert and oriented but unable to process the dc planning details. Pt became confused and stated that he "didn't understand" what I was talking about which was finding a SNF near his dialysis center. Pt goes to dialysis T,T,Sat at [facility]. Pt has been to [facility] 2x in the last year. [facility] declined 3rd admission. CSW discussed dc planning with Pt's wife who requested SNF at [facility] because it is "close to where we live." CSW faxed Pt's information and arranged for possible dc on 02/20/12 if medically cleared.   Assessment/plan status:  Psychosocial Support/Ongoing Assessment of Needs Other assessment/ plan:   Information/referral to community resources:    PATIENT'S/FAMILY'S RESPONSE TO PLAN OF CARE: Pt agreeable to SNF in what could be a limited understanding of the process. Pt's wife however, is familiar with the level of care and requested a facility near their home that she knows of.     Frederico Hamman, LCSW [phone]

## 2012-02-20 NOTE — Telephone Encounter (Signed)
Message copied by Margaretmary Eddy on Wed Feb 20, 2012 11:01 AM ------      Message from: Lorin Mercy K      Created: Wed Feb 20, 2012  8:56 AM      Regarding: schedule                   ----- Message -----         From: Dara Lords, PA         Sent: 02/20/2012   8:11 AM           To: Sharee Pimple, CMA            S/p right TMA by VB.  F/u with him in 2 weeks.            Thanks.      Samantha ------

## 2012-02-20 NOTE — Progress Notes (Signed)
[facility] Progress Note  02/20/2012 7:33 AM POD 5  Subjective:  Per RN note, pt confused this am.  Pulled out IV (pt not on heparin now due to this)  Tm 99.3 VSS 100% RA  Filed Vitals:   02/20/12 0420  BP: 115/57  Pulse: 90  Temp: 99.3 F (37.4 C)  Resp:     Physical Exam: Incisions:  Bandage covered and dry Extremities:  Good movement of RLE  CBC    Component Value Date/Time   WBC 8.2 02/20/2012 0540   RBC 3.46* 02/20/2012 0540   HGB 9.7* 02/20/2012 0540   HCT 31.2* 02/20/2012 0540   PLT 227 02/20/2012 0540   MCV 90.2 02/20/2012 0540   MCH 28.0 02/20/2012 0540   MCHC 31.1 02/20/2012 0540   RDW 14.4 02/20/2012 0540   LYMPHSABS 1.4 01/22/2012 0640   MONOABS 1.2* 01/22/2012 0640   EOSABS 0.0 01/22/2012 0640   BASOSABS 0.0 01/22/2012 0640    BMET    Component Value Date/Time   NA 133* 02/19/2012 1244   K 4.1 02/19/2012 1244   CL 94* 02/19/2012 1244   CO2 25 02/19/2012 1244   GLUCOSE 108* 02/19/2012 1244   BUN 45* 02/19/2012 1244   CREATININE 9.73* 02/19/2012 1244   CALCIUM 8.5 02/19/2012 1244   CALCIUM 8.8 10/27/2010 0734   GFRNONAA 4* 02/19/2012 1244   GFRAA 5* 02/19/2012 1244    INR    Component Value Date/Time   INR 1.33 02/20/2012 0540   INR 3.18 02/06/2011     Intake/Output Summary (Last 24 hours) at 02/20/12 1610 Last data filed at 02/20/12 0500  Gross per 24 hour  Intake 303.57 ml  Output   2119 ml  Net -1815.43 ml     Assessment/Plan:  79 y.o. male is s/p:  Right transmetatarsal amputation    POD 5  -Pt is confused this am -heparin is not running due to pulling out IV -continue coumadin per pharmacy -pt is refusing a SNF, but feel he is unsafe to go home -Darco shoe does not fit appropriately-will order different shoe   Doreatha Massed, PA-C [facility] [phone] 02/20/2012 7:33 AM    \

## 2012-02-20 NOTE — Progress Notes (Signed)
Subjective: No complaints. "I think the [facility] game is on tonight"  Objective: Vital signs in last 24 hours: Temp:  [97.7 F (36.5 C)-99.3 F (37.4 C)] 99.3 F (37.4 C) (02/12 0420) Pulse Rate:  [80-95] 90 (02/12 0420) Resp:  [14-23] 18 (02/11 2057) BP: (101-145)/(43-72) 115/57 mmHg (02/12 0420) SpO2:  [98 %-100 %] 100 % (02/12 0420) Weight:  [101 kg (222 lb 10.6 oz)-103.1 kg (227 lb 4.7 oz)] 103.1 kg (227 lb 4.7 oz) (02/12 4098) Weight change:   Intake/Output from previous day: 02/11 0701 - 02/12 0700 In: 543.6 [P.O.:360; I.V.:183.6] Out: 2119  Intake/Output this shift:    Lab Results:  Recent Labs  02/19/12 1244 02/20/12 0540  WBC 9.4 8.2  HGB 9.0* 9.7*  HCT 28.3* 31.2*  PLT 221 227   BMET:   Recent Labs  02/18/12 0515 02/19/12 1244  NA 136 133*  K 3.9 4.1  CL 97 94*  CO2 28 25  GLUCOSE 81 108*  BUN 31* 45*  CREATININE 7.45* 9.73*  CALCIUM 8.6 8.5   No results found for this basename: PTH,  in the last 72 hours Iron Studies: No results found for this basename: IRON, TIBC, TRANSFERRIN, FERRITIN,  in the last 72 hours  Studies/Results: No results found.  I have reviewed the patient's current medications.  Exam: Gen: alert, Ox3, no distress Resp: diminished breath sounds bilaterally, no rales Cardio: S1, S2 normal, systolic ejection 2/6, decrescendo at 2nd left intercostal space and diastolic murmur: early diastolic 2/6, decrescendo at 2nd left intercostal space GI: pos bs, liver down 4 cm Extremities: AVF RUA, BVT; R foot TMA wrapped, no edema Oriented x 3  Dialysis Orders: [facility] TTS  EDW 102.5 kg HD Bath 2.0 k, 2.25 ca Time 4 hrs 15 min Heparin 3,400units. Access Right upper arm avf BFR 500 DFR 800 Hectoral 4 mcg IV/HD Epogen 6000 Venofer 100mg  q weekly hd though 2/13; Last iPTH 351 and Hgb 10.6   Assessment/Plan: 1 ESRD stable, TTS hd 2 DM controlled 3 AMS/low grade fever- IV vanc empiric, no cx's sent, Ox3 4 s/p R TMA on 2/7 per Dr  Myra Gianotti 5 Anemia of CKD epo 6 HPTH 7 Afib- IV hep and coumadin, HR controlled on BB. D/C'd telemetry  8 Dispo to SNF possibly today    LOS: 5 days   Alan [name] D 02/20/2012,7:58 AM

## 2012-02-20 NOTE — Progress Notes (Signed)
ANTICOAGULATION CONSULT NOTE Pharmacy Consult for Heparin/Coumadin Indication: atrial fibrillation  Allergies  Allergen Reactions  . Ancef (Cefazolin) Rash    Patient cant recall if allergy to this med exists.     Patient Measurements: Height: 6' 0.05" (183 cm) Weight: 227 lb 4.7 oz (103.1 kg) IBW/kg (Calculated) : 77.71 Heparin Dosing Weight: 98 kg  Vital Signs: Temp: 99.3 F (37.4 C) (02/12 0420) Temp src: Oral (02/11 2057) BP: 115/57 mmHg (02/12 0420) Pulse Rate: 90 (02/12 0420)  Labs:  Recent Labs  02/18/12 0515 02/19/12 1244 02/19/12 2126 02/20/12 0540  HGB 9.6* 9.0*  --  9.7*  HCT 30.4* 28.3*  --  31.2*  PLT 212 221  --  227  LABPROT  --   --   --  16.2*  INR  --   --   --  1.33  HEPARINUNFRC  --   --  <0.10* 0.22*  CREATININE 7.45* 9.73*  --   --     Estimated Creatinine Clearance: 7.8 ml/min (by C-G formula based on Cr of 9.73).  Assessment: 79 yo male with h/o Afib for anticoagulation  Goal of Therapy:  INR 2-3 Heparin level 0.3-0.7 units/ml Monitor platelets by anticoagulation protocol: Yes   Plan:  Increase heparin gtt to 2000 units/hr Check heparin level in 8 hours. Coumadin 7.5 mg tonight  Geannie Risen, PharmD, BCPS  02/20/2012 6:53 AM

## 2012-02-20 NOTE — Progress Notes (Signed)
Orthopedic Tech Progress Note Patient Details:  Alan [name] [birth year] [id number]  Patient ID: [name], male   DOB: 12-08-1931, 79 y.o.   MRN: [id number]   [name] 02/20/2012, 9:08 AM RIGHT POST OP SHOE

## 2012-02-20 NOTE — Progress Notes (Signed)
Patient is alert but appears confused by stating "close the door so the deer does not come in"; "I am going to the police department to report for duty". Redirected patient but still confused. Safety precautions in place.  Will continue to monitor. Cosigned by: Laverle Hobby RN

## 2012-02-20 NOTE — Progress Notes (Signed)
Patient alert but combative. Pulled out his IV. Refused to have it reconnected. The morning nurse made aware. Md present in Pt's room.

## 2012-02-20 NOTE — Discharge Summary (Signed)
[facility] Discharge Summary  AUDI WETTSTEIN 1931-11-02 79 y.o. male  [id number]  Admission Date: 02/15/2012  Discharge Date: 02/20/12  Physician: Nada Libman, MD  Admission Diagnosis: PVD W/ GANGRENE    HPI:   This is a 79 y.o. male admitted with gangrenous right 2nd toe. He was having his wound debrided at the wound care center yesterday where he was found to have a great deal of swelling of the foot and the 2nd right toe was gangrenous. The pt had a fever of close to 101 at the wound care center. Due to these circumstances and his hx of ESRD, CAD s/p CABG '96, HTN, Afib and on coumadin, gout and DM neuropathy. He has chronic PVD with hx of left BKA ~ a year ago. IN the ED, his temp was 100.4 and WBC of 14.1. He was placed on IV ABx and his WBC today is down to 13 and a Tm 102 now 98.4. Pt's INR today is 5.2.   Hospital Course:  The patient was admitted to the hospital and taken to the operating room on 02/15/2012 and underwent  Right transmetatarsal amputation    The pt tolerated the procedure well and was transported to the PACU in good condition. He has done well.  His incision is separated at the medial and lateral aspect.  On this last day of hospitalization, there is necrotic tissue around the incision.  We will see him back in 2 weeks to re evaluate this.  He may need a BKA in the future.  The remainder of the hospital course consisted of increasing mobilization and increasing intake of solids without difficulty.  CBC    Component Value Date/Time   WBC 8.2 02/20/2012 0540   RBC 3.46* 02/20/2012 0540   HGB 9.7* 02/20/2012 0540   HCT 31.2* 02/20/2012 0540   PLT 227 02/20/2012 0540   MCV 90.2 02/20/2012 0540   MCH 28.0 02/20/2012 0540   MCHC 31.1 02/20/2012 0540   RDW 14.4 02/20/2012 0540   LYMPHSABS 1.4 01/22/2012 0640   MONOABS 1.2* 01/22/2012 0640   EOSABS 0.0 01/22/2012 0640   BASOSABS 0.0 01/22/2012 0640    BMET    Component Value Date/Time   NA  133* 02/19/2012 1244   K 4.1 02/19/2012 1244   CL 94* 02/19/2012 1244   CO2 25 02/19/2012 1244   GLUCOSE 108* 02/19/2012 1244   BUN 45* 02/19/2012 1244   CREATININE 9.73* 02/19/2012 1244   CALCIUM 8.5 02/19/2012 1244   CALCIUM 8.8 10/27/2010 0734   GFRNONAA 4* 02/19/2012 1244   GFRAA 5* 02/19/2012 1244     Discharge Instructions:   The patient is discharged to home with extensive instructions on wound care and progressive ambulation.  They are instructed not to drive or perform any heavy lifting until returning to see the physician in his office.  Discharge Orders   Future Orders Complete By Expires     Call MD for:  redness, tenderness, or signs of infection (pain, swelling, bleeding, redness, odor or green/yellow discharge around incision site)  As directed     Call MD for:  severe or increased pain, loss or decreased feeling  in affected limb(s)  As directed     Call MD for:  temperature >100.5  As directed     Discharge wound care:  As directed     Comments:      Change dressing once a day with non adherent dressing, kerlix and ACE bandage.  Use a small wet to dry on the lateral malleolus in the wrap.    Resume previous diet  As directed        Discharge Diagnosis:  PVD W/ GANGRENE   Secondary Diagnosis: Patient Active Problem List  Diagnosis  . Encounter for long-term (current) use of anticoagulants  . Aortic stenosis  . CAD (coronary artery disease)  . End stage renal disease on dialysis  . Sepsis  . Toxic metabolic encephalopathy  . Diabetes mellitus  . Chronic anticoagulation  . Hypotension  . Atherosclerosis of native arteries of the extremities with ulceration(440.23)  . Aftercare following surgery of the circulatory system, NEC  . SIRS (systemic inflammatory response syndrome)  . Wound infection  . Unilateral complete BKA  . Atrial fibrillation  . Pre-operative cardiovascular examination  . Hx of BKA   Past Medical History  Diagnosis Date  . Diabetes mellitus    . Gout   . Hypertension   . Hyperlipidemia   . Arthritis   . Prostate cancer     s/p seed implant  . Cataract     BILATERAL-BEEN REMOVED  . Neuromuscular disorder     DABETIC NEUROPATHY-LOWER EXTREMITY  . Atrial fibrillation     on coumadin  . Aortic stenosis     moderate to severe, not felt to be a surgical candidate  . Anemia     secondary to end stage renal disease  . Coronary artery disease     remote CABG in 1996; last stress test in 2005; last cath 2005  . Diabetic neuropathy   . Edema   . PAD (peripheral artery disease)   . Diabetic retinopathy(362.0)   . Chronic anticoagulation     on coumadin  . Shortness of breath   . Chronic kidney disease     Started dialysis October 2012  . Nephrolithiasis     prior history of percutaneous nephrostomy  . Myocardial infarction     1999 ish     Medication List    TAKE these medications       allopurinol 300 MG tablet  Commonly known as:  ZYLOPRIM  Take 0.5 tablets (150 mg total) by mouth daily.     aspirin EC 81 MG tablet  Take 1 tablet (81 mg total) by mouth daily.     calcium carbonate 500 MG chewable tablet  Commonly known as:  TUMS - dosed in mg elemental calcium  Chew 1 tablet by mouth 3 (three) times daily with meals. Phosphorous binder     enoxaparin 100 MG/ML injection  Commonly known as:  LOVENOX  Inject 1 mL (100 mg total) into the skin daily.     estazolam 2 MG tablet  Commonly known as:  PROSOM  Take 1 mg by mouth at bedtime as needed. For sleep     glimepiride 4 MG tablet  Commonly known as:  AMARYL  Take 2 mg by mouth daily with breakfast.     ibuprofen 200 MG tablet  Commonly known as:  ADVIL,MOTRIN  Take 200 mg by mouth every 6 (six) hours as needed. For pain     metoprolol tartrate 25 MG tablet  Commonly known as:  LOPRESSOR  Take 37.5 mg by mouth 2 (two) times daily. Takes 1.5 tablets twice daily     oxyCODONE 5 MG immediate release tablet  Commonly known as:  ROXICODONE  Take 1  tablet (5 mg total) by mouth every 6 (six) hours as needed for pain.  simvastatin 40 MG tablet  Commonly known as:  ZOCOR  Take 1 tablet (40 mg total) by mouth at bedtime.     warfarin 5 MG tablet  Commonly known as:  COUMADIN  Take 5 mg by mouth See admin instructions. Patient takes 1 tablet every day, except Friday-takes 1/2 tablet.       -GIVE LOVENOX DAILY UNTIL INR IS THERAPEUTIC AT 2.0-2.5 (#3 - 3RF) -OXYCODONE #30 NR  Change dressing daily with non adherent bandage, then wrap with kerlix and ace wrap.  Use wet to dry saline dressing on lateral malleolus and incorporate into wrap. And place post op shoe.    PT IS PARTIAL WEIGHT BEARING - HEAL ONLY  Disposition: SNF  Patient's condition: is Limited and needs PT  Follow up: 1. [name] in 2 weeks   Doreatha Massed, PA-C [facility] [phone] 02/20/2012  8:11 AM   Pt to resume hemodialysis at [facility] T/T/S.

## 2012-02-22 NOTE — Discharge Summary (Signed)
I agree with the above  Wells Brabham 

## 2012-02-25 ENCOUNTER — Encounter: Payer: Self-pay | Admitting: Neurosurgery

## 2012-02-25 ENCOUNTER — Telehealth: Payer: Self-pay | Admitting: Surgery

## 2012-02-25 ENCOUNTER — Ambulatory Visit (INDEPENDENT_AMBULATORY_CARE_PROVIDER_SITE_OTHER): Payer: MEDICARE | Admitting: Neurosurgery

## 2012-02-25 VITALS — BP 108/64 | HR 91 | Temp 99.0°F | Resp 18 | Ht 72.0 in | Wt 227.0 lb

## 2012-02-25 DIAGNOSIS — L089 Local infection of the skin and subcutaneous tissue, unspecified: Secondary | ICD-10-CM

## 2012-02-25 DIAGNOSIS — I70269 Atherosclerosis of native arteries of extremities with gangrene, unspecified extremity: Secondary | ICD-10-CM | POA: Insufficient documentation

## 2012-02-25 DIAGNOSIS — T148XXA Other injury of unspecified body region, initial encounter: Secondary | ICD-10-CM

## 2012-02-25 NOTE — Progress Notes (Signed)
[facility] [place] PAD/PVD Office Note  CC: Nonhealing right transmetatarsal amputation Referring Physician: Brabham  History of Present Illness: 79-year-old male patient Dr. Brabham who is status post a right transmetatarsal amputation February 15 2012. The patient has been in a facility for rehabilitation. The patient's wife called the office asking for the patient be seen due to a nonhealing wound. The patient does not report great amounts of pain however there is drainage and open surgical bed.  Past Medical History  Diagnosis Date  . Diabetes mellitus   . Gout   . Hypertension   . Hyperlipidemia   . Arthritis   . Prostate cancer     s/p seed implant  . Cataract     BILATERAL-BEEN REMOVED  . Neuromuscular disorder     DABETIC NEUROPATHY-LOWER EXTREMITY  . Atrial fibrillation     on coumadin  . Aortic stenosis     moderate to severe, not felt to be a surgical candidate  . Anemia     secondary to end stage renal disease  . Coronary artery disease     remote CABG in 1996; last stress test in 2005; last cath 2005  . Diabetic neuropathy   . Edema   . PAD (peripheral artery disease)   . Diabetic retinopathy(362.0)   . Chronic anticoagulation     on coumadin  . Shortness of breath   . Chronic kidney disease     Started dialysis October 2012  . Nephrolithiasis     prior history of percutaneous nephrostomy  . Myocardial infarction     1999 ish    ROS: [x] Positive   [ ] Denies    General: [ ] Weight loss, [ ] Fever, [ ] chills Neurologic: [ ] Dizziness, [ ] Blackouts, [ ] Seizure [ ] Stroke, [ ] "Mini stroke", [ ] Slurred speech, [ ] Temporary blindness; [ ] weakness in arms or legs, [ ] Hoarseness Cardiac: [ ] Chest pain/pressure, [ ] Shortness of breath at rest [ ] Shortness of breath with exertion, [ ] Atrial fibrillation or irregular heartbeat Vascular: [ ] Pain in legs with walking, [ ] Pain in legs at rest, [ ] Pain in legs at night,  [ ]  Non-healing ulcer, [ ] Blood clot in vein/DVT,   Pulmonary: [ ] Home oxygen, [ ] Productive cough, [ ] Coughing up blood, [ ] Asthma,  [ ] Wheezing Musculoskeletal:  [ ] Arthritis, [ ] Low back pain, [ ] Joint pain Hematologic: [ ] Easy Bruising, [ ] Anemia; [ ] Hepatitis Gastrointestinal: [ ] Blood in stool, [ ] Gastroesophageal Reflux/heartburn, [ ] Trouble swallowing Urinary: [ ] chronic Kidney disease, [ ] on HD - [ ] MWF or [ ] TTHS, [ ] Burning with urination, [ ] Difficulty urinating Skin: [ ] Rashes, [ ] Wounds Psychological: [ ] Anxiety, [ ] Depression   Social History History  Substance Use Topics  . Smoking status: Former Smoker -- 2 years    Types: Cigarettes    Quit date: 04/10/1968  . Smokeless tobacco: Former User    Types: Chew  . Alcohol Use: No    Family History Family History  Problem Relation Age of Onset  . Heart disease Father   . Hypertension Brother   . Stroke Brother   . Diabetes Mother     Pt. not sure, but he thinks she did.    Allergies  Allergen Reactions  . Ancef (Cefazolin) Rash      Patient cant recall if allergy to this med exists.     Current Outpatient Prescriptions  Medication Sig Dispense Refill  . allopurinol (ZYLOPRIM) 300 MG tablet Take 0.5 tablets (150 mg total) by mouth daily.  30 tablet  1  . aspirin EC 81 MG tablet Take 1 tablet (81 mg total) by mouth daily.      . calcium carbonate (TUMS - DOSED IN MG ELEMENTAL CALCIUM) 500 MG chewable tablet Chew 1 tablet by mouth 3 (three) times daily with meals. Phosphorous binder      . enoxaparin (LOVENOX) 100 MG/ML injection Inject 1 mL (100 mg total) into the skin daily.  3 Syringe  3  . estazolam (PROSOM) 2 MG tablet Take 1 mg by mouth at bedtime as needed. For sleep      . glimepiride (AMARYL) 4 MG tablet Take 2 mg by mouth daily with breakfast.      . ibuprofen (ADVIL,MOTRIN) 200 MG tablet Take 200 mg by mouth every 6 (six) hours as needed. For pain      . metoprolol tartrate  (LOPRESSOR) 25 MG tablet Take 37.5 mg by mouth 2 (two) times daily. Takes 1.5 tablets twice daily      . oxyCODONE (ROXICODONE) 5 MG immediate release tablet Take 1 tablet (5 mg total) by mouth every 6 (six) hours as needed for pain.  30 tablet  0  . simvastatin (ZOCOR) 40 MG tablet Take 1 tablet (40 mg total) by mouth at bedtime.  30 tablet  1  . warfarin (COUMADIN) 5 MG tablet Take 5 mg by mouth See admin instructions. Patient takes 1 tablet every day, except Friday-takes 1/2 tablet.       No current facility-administered medications for this visit.    Physical Examination  Filed Vitals:   02/25/12 1525  BP: 108/64  Pulse: 91  Temp: 99 F (37.2 C)  Resp: 18    Body mass index is 30.78 kg/(m^2).  General:  WDWN in NAD Gait: Normal HEENT: WNL Eyes: Pupils equal Pulmonary: normal non-labored breathing , without Rales, rhonchi,  wheezing Cardiac: RRR, without  Murmurs, rubs or gallops; No carotid bruits Abdomen: soft, NT, no masses Skin: no rashes, ulcers noted Vascular Exam/Pulses: Status post left BKA, right lower extremity pulses are not palpated. Dr. Brabham examine the patient and explained to the patient and his wife at length that he does not feel that this will heal and a BKA is necessary. The surgical bed is open the sutures released on their own there is an eschar with nonhealing tissue.  Extremities without ischemic changes, no Gangrene , no cellulitis; no open wounds;  Musculoskeletal: no muscle wasting or atrophy  Neurologic: A&O X 3; Appropriate Affect ; SENSATION: normal; MOTOR FUNCTION:  moving all extremities equally. Speech is fluent/normal    ASSESSMENT/PLAN: Nonhealing right transmetatarsal amputation. Dr. Brabham spent 30 minutes speaking with the patient and his wife regarding the need for right BKA. This will be scheduled for Friday there were 21st 2014 with Dr. Brabham. The patient's Coumadin will be stopped today. The patient and his wife understand the  patient will need return to the skilled nursing facility until surgery. The patient's disposition will be pending his hospital course. The patient and his wife's questions were encouraged and answered, they're in agreement with this plan, the surgery is again  tentatively scheduled for Friday, February 21.  Ivy Meriwether ANP  , M.D.: Brabham  

## 2012-02-25 NOTE — Telephone Encounter (Addendum)
Message copied by Fredrich Birks on Mon Feb 25, 2012 11:07 AM ------      Message from: Phillips Odor      Created: Mon Feb 25, 2012 10:52 AM      Regarding: add-on to NP schedule today       Wife called to report right foot wound open, drainage and foul odor.  Per [facility], add to Rusty's schedule today.  I have arranged for [facility]. To bring him at 2:30 for a 2:40 pm appt. With Rusty.  ------  Dpm- scheduled accordingly, requested from med rec, dpm

## 2012-02-26 ENCOUNTER — Other Ambulatory Visit: Payer: Self-pay

## 2012-02-26 ENCOUNTER — Encounter (HOSPITAL_COMMUNITY): Payer: Self-pay | Admitting: Pharmacy Technician

## 2012-02-26 LAB — PROTIME-INR: INR: 2.6 — AB (ref 0.9–1.1)

## 2012-02-27 ENCOUNTER — Ambulatory Visit: Payer: Self-pay | Admitting: Cardiology

## 2012-02-27 DIAGNOSIS — Z7901 Long term (current) use of anticoagulants: Secondary | ICD-10-CM

## 2012-02-28 MED ORDER — SODIUM CHLORIDE 0.9 % IV SOLN: 100.0000 mL | INTRAVENOUS | Status: DC | PRN

## 2012-02-28 MED ORDER — NEPRO/CARBSTEADY PO LIQD: 237.0000 mL | ORAL | Status: DC | PRN

## 2012-02-28 MED ORDER — ALTEPLASE 2 MG IJ SOLR: 2.0000 mg | Freq: Once | INTRAMUSCULAR | Status: DC | PRN

## 2012-02-28 MED ORDER — VANCOMYCIN HCL IN DEXTROSE 1-5 GM/200ML-% IV SOLN: 1000.0000 mg | INTRAVENOUS | Status: DC

## 2012-02-28 MED ORDER — PENTAFLUOROPROP-TETRAFLUOROETH EX AERO: 1.0000 "application " | INHALATION_SPRAY | CUTANEOUS | Status: DC | PRN

## 2012-02-28 MED ORDER — LIDOCAINE HCL (PF) 1 % IJ SOLN: 5.0000 mL | INTRAMUSCULAR | Status: DC | PRN

## 2012-02-28 MED ORDER — LIDOCAINE-PRILOCAINE 2.5-2.5 % EX CREA: 1.0000 "application " | TOPICAL_CREAM | CUTANEOUS | Status: DC | PRN

## 2012-02-28 MED ORDER — VANCOMYCIN HCL 10 G IV SOLR
1500.0000 mg | INTRAVENOUS | Status: AC
  Administered 2012-02-29: 1500 mg via INTRAVENOUS
  Filled 2012-02-28: qty 1500

## 2012-02-28 MED ORDER — HEPARIN SODIUM (PORCINE) 1000 UNIT/ML DIALYSIS: 1000.0000 [IU] | INTRAMUSCULAR | Status: DC | PRN

## 2012-02-28 NOTE — Pre-Procedure Instructions (Addendum)
Alan [name]  02/28/2012   Your procedure is scheduled on:  Friday, February 21st.     Report to [facility] at 7:00AM.    Call this number if you have problems the morning of surgery: [phone]   Remember:   Do not eat food or drink liquids after midnight     Take these medicines the morning of surgery with A SIP OF WATER: Allopurinol and Lopressor.  May take Oxycodone if needed.   Do not take Ibuprofen or Coumadin.     End patients health history and Medication Record with last meds given documented.   Do not wear jewelry, make-up or nail polish.  Do not wear lotions, powders, or perfumes. You may wear deodorant.   Men may shave face and neck.  Do not bring valuables to the hospital.  Contacts, dentures or bridgework may not be worn into surgery.  Leave suitcase in the car. After surgery it may be brought to your room.  For patients admitted to the hospital, checkout time is 11:00 AM the day of  discharge.

## 2012-02-28 NOTE — Progress Notes (Signed)
I called [facility] [employer] x 2  And was on hold for a long time , I did get to speak briefly with pts nurse, Jonny Ruiz.  I instructed him of the information that I have listed on the Pre- Procedure note.

## 2012-02-29 ENCOUNTER — Encounter (HOSPITAL_COMMUNITY): Payer: Self-pay | Admitting: *Deleted

## 2012-02-29 ENCOUNTER — Inpatient Hospital Stay (HOSPITAL_COMMUNITY): Payer: MEDICARE | Admitting: Anesthesiology

## 2012-02-29 ENCOUNTER — Telehealth: Payer: Self-pay | Admitting: Surgery

## 2012-02-29 ENCOUNTER — Inpatient Hospital Stay (HOSPITAL_COMMUNITY)
Admission: RE | Admit: 2012-02-29 | Discharge: 2012-03-05 | DRG: 474 | Disposition: A | Discharge status: Home Health | Payer: MEDICARE | Source: Ambulatory Visit | Attending: Surgery | Admitting: Surgery

## 2012-02-29 ENCOUNTER — Encounter (HOSPITAL_COMMUNITY)
Admission: RE | Disposition: A | Discharge status: Home Health | Payer: Self-pay | Source: Ambulatory Visit | Attending: Surgery

## 2012-02-29 ENCOUNTER — Encounter (HOSPITAL_COMMUNITY): Payer: Self-pay | Admitting: Anesthesiology

## 2012-02-29 DIAGNOSIS — E1142 Type 2 diabetes mellitus with diabetic polyneuropathy: Secondary | ICD-10-CM | POA: Diagnosis present

## 2012-02-29 DIAGNOSIS — Z951 Presence of aortocoronary bypass graft: Secondary | ICD-10-CM

## 2012-02-29 DIAGNOSIS — I70269 Atherosclerosis of native arteries of extremities with gangrene, unspecified extremity: Secondary | ICD-10-CM

## 2012-02-29 DIAGNOSIS — E1149 Type 2 diabetes mellitus with other diabetic neurological complication: Secondary | ICD-10-CM | POA: Diagnosis present

## 2012-02-29 DIAGNOSIS — Y835 Amputation of limb(s) as the cause of abnormal reaction of the patient, or of later complication, without mention of misadventure at the time of the procedure: Secondary | ICD-10-CM | POA: Diagnosis present

## 2012-02-29 DIAGNOSIS — N186 End stage renal disease: Secondary | ICD-10-CM | POA: Diagnosis present

## 2012-02-29 DIAGNOSIS — Z7901 Long term (current) use of anticoagulants: Secondary | ICD-10-CM

## 2012-02-29 DIAGNOSIS — I120 Hypertensive chronic kidney disease with stage 5 chronic kidney disease or end stage renal disease: Secondary | ICD-10-CM | POA: Diagnosis present

## 2012-02-29 DIAGNOSIS — E785 Hyperlipidemia, unspecified: Secondary | ICD-10-CM | POA: Diagnosis present

## 2012-02-29 DIAGNOSIS — I2510 Atherosclerotic heart disease of native coronary artery without angina pectoris: Secondary | ICD-10-CM | POA: Diagnosis present

## 2012-02-29 DIAGNOSIS — D631 Anemia in chronic kidney disease: Secondary | ICD-10-CM | POA: Diagnosis present

## 2012-02-29 DIAGNOSIS — I4891 Unspecified atrial fibrillation: Secondary | ICD-10-CM | POA: Diagnosis present

## 2012-02-29 DIAGNOSIS — T8789 Other complications of amputation stump: Principal | ICD-10-CM | POA: Diagnosis present

## 2012-02-29 DIAGNOSIS — E1139 Type 2 diabetes mellitus with other diabetic ophthalmic complication: Secondary | ICD-10-CM | POA: Diagnosis present

## 2012-02-29 DIAGNOSIS — S88119A Complete traumatic amputation at level between knee and ankle, unspecified lower leg, initial encounter: Secondary | ICD-10-CM

## 2012-02-29 DIAGNOSIS — I359 Nonrheumatic aortic valve disorder, unspecified: Secondary | ICD-10-CM | POA: Diagnosis present

## 2012-02-29 DIAGNOSIS — Z87891 Personal history of nicotine dependence: Secondary | ICD-10-CM

## 2012-02-29 DIAGNOSIS — I252 Old myocardial infarction: Secondary | ICD-10-CM

## 2012-02-29 DIAGNOSIS — E11319 Type 2 diabetes mellitus with unspecified diabetic retinopathy without macular edema: Secondary | ICD-10-CM | POA: Diagnosis present

## 2012-02-29 DIAGNOSIS — M109 Gout, unspecified: Secondary | ICD-10-CM | POA: Diagnosis present

## 2012-02-29 DIAGNOSIS — Z992 Dependence on renal dialysis: Secondary | ICD-10-CM

## 2012-02-29 HISTORY — PX: AMPUTATION: SHX166

## 2012-02-29 LAB — GLUCOSE, CAPILLARY
Glucose-Capillary: 114 mg/dL — ABNORMAL HIGH (ref 70–99)
Glucose-Capillary: 118 mg/dL — ABNORMAL HIGH (ref 70–99)
Glucose-Capillary: 131 mg/dL — ABNORMAL HIGH (ref 70–99)

## 2012-02-29 LAB — CBC
Hemoglobin: 9.7 g/dL — ABNORMAL LOW (ref 13.0–17.0)
Hemoglobin: 9.8 g/dL — ABNORMAL LOW (ref 13.0–17.0)
MCH: 27.4 pg (ref 26.0–34.0)
MCHC: 30.4 g/dL (ref 30.0–36.0)
MCHC: 32.5 g/dL (ref 30.0–36.0)
Platelets: 341 10*3/uL (ref 150–400)
RDW: 14.4 % (ref 11.5–15.5)
WBC: 11.1 10*3/uL — ABNORMAL HIGH (ref 4.0–10.5)

## 2012-02-29 LAB — BASIC METABOLIC PANEL
BUN: 22 mg/dL (ref 6–23)
Calcium: 8.4 mg/dL (ref 8.4–10.5)
Chloride: 96 mEq/L (ref 96–112)
GFR calc Af Amer: 11 mL/min — ABNORMAL LOW (ref >90)
GFR calc non Af Amer: 11 mL/min — ABNORMAL LOW (ref >90)
GFR calc non Af Amer: 10 mL/min — ABNORMAL LOW (ref >90)
Glucose, Bld: 137 mg/dL — ABNORMAL HIGH (ref 70–99)
Potassium: 4.1 mEq/L (ref 3.5–5.1)
Sodium: 136 mEq/L (ref 135–145)

## 2012-02-29 LAB — POCT I-STAT 4, (NA,K, GLUC, HGB,HCT)
Glucose, Bld: 112 mg/dL — ABNORMAL HIGH (ref 70–99)
Hemoglobin: 8.5 g/dL — ABNORMAL LOW (ref 13.0–17.0)
Potassium: 3.4 mEq/L — ABNORMAL LOW (ref 3.5–5.1)

## 2012-02-29 LAB — HEMOGLOBIN A1C: Hgb A1c MFr Bld: 5.8 % — ABNORMAL HIGH (ref <5.7)

## 2012-02-29 LAB — ABO/RH: ABO/RH(D): O POS

## 2012-02-29 LAB — PROTIME-INR: Prothrombin Time: 20.3 seconds — ABNORMAL HIGH (ref 11.6–15.2)

## 2012-02-29 SURGERY — AMPUTATION BELOW KNEE
Anesthesia: General | Site: Leg Lower | Laterality: Right | Wound class: Clean

## 2012-02-29 MED ORDER — FENTANYL CITRATE 0.05 MG/ML IJ SOLN
INTRAMUSCULAR | Status: DC | PRN
  Administered 2012-02-29 (×4): 25 ug via INTRAVENOUS

## 2012-02-29 MED ORDER — OXYCODONE HCL 5 MG PO TABS: 5.0000 mg | ORAL_TABLET | Freq: Once | ORAL | Status: AC | PRN

## 2012-02-29 MED ORDER — KETOROLAC TROMETHAMINE 30 MG/ML IJ SOLN
30.0000 mg | Freq: Once | INTRAMUSCULAR | Status: AC
  Administered 2012-02-29: 30 mg via INTRAVENOUS

## 2012-02-29 MED ORDER — CALCIUM CARBONATE ANTACID 500 MG PO CHEW
1.0000 | CHEWABLE_TABLET | Freq: Three times a day (TID) | ORAL | Status: DC
  Administered 2012-02-29 – 2012-03-05 (×12): 200 mg via ORAL
  Filled 2012-02-29 (×17): qty 1

## 2012-02-29 MED ORDER — VANCOMYCIN HCL IN DEXTROSE 1-5 GM/200ML-% IV SOLN: 1000.0000 mg | Freq: Two times a day (BID) | INTRAVENOUS | Status: DC

## 2012-02-29 MED ORDER — WARFARIN SODIUM 2.5 MG PO TABS
2.5000 mg | ORAL_TABLET | ORAL | Status: DC
  Administered 2012-02-29: 2.5 mg via ORAL
  Filled 2012-02-29: qty 1

## 2012-02-29 MED ORDER — NALOXONE HCL 0.4 MG/ML IJ SOLN
INTRAMUSCULAR | Status: AC
  Filled 2012-02-29: qty 1

## 2012-02-29 MED ORDER — WARFARIN - PHYSICIAN DOSING INPATIENT: Freq: Every day | Status: DC

## 2012-02-29 MED ORDER — ONDANSETRON HCL 4 MG/2ML IJ SOLN
INTRAMUSCULAR | Status: DC | PRN
  Administered 2012-02-29: 4 mg via INTRAVENOUS

## 2012-02-29 MED ORDER — ACETAMINOPHEN 500 MG PO TABS
1000.0000 mg | ORAL_TABLET | Freq: Three times a day (TID) | ORAL | Status: DC | PRN
  Administered 2012-02-29: 1000 mg via ORAL
  Filled 2012-02-29: qty 2

## 2012-02-29 MED ORDER — GUAIFENESIN-DM 100-10 MG/5ML PO SYRP: 15.0000 mL | ORAL_SOLUTION | ORAL | Status: DC | PRN

## 2012-02-29 MED ORDER — PHENOL 1.4 % MT LIQD: 1.0000 | OROMUCOSAL | Status: DC | PRN

## 2012-02-29 MED ORDER — OXYCODONE HCL 5 MG PO TABS
5.0000 mg | ORAL_TABLET | Freq: Four times a day (QID) | ORAL | Status: DC | PRN
  Administered 2012-03-01 – 2012-03-04 (×6): 5 mg via ORAL
  Filled 2012-02-29 (×6): qty 1

## 2012-02-29 MED ORDER — HYDROMORPHONE HCL PF 1 MG/ML IJ SOLN
INTRAMUSCULAR | Status: AC
  Filled 2012-02-29: qty 1

## 2012-02-29 MED ORDER — TEMAZEPAM 15 MG PO CAPS: 15.0000 mg | ORAL_CAPSULE | Freq: Every evening | ORAL | Status: DC | PRN

## 2012-02-29 MED ORDER — PANTOPRAZOLE SODIUM 40 MG PO TBEC
40.0000 mg | DELAYED_RELEASE_TABLET | Freq: Every day | ORAL | Status: DC
  Administered 2012-02-29 – 2012-03-04 (×5): 40 mg via ORAL
  Filled 2012-02-29 (×5): qty 1

## 2012-02-29 MED ORDER — KETOROLAC TROMETHAMINE 30 MG/ML IJ SOLN
INTRAMUSCULAR | Status: AC
  Filled 2012-02-29: qty 1

## 2012-02-29 MED ORDER — DOXERCALCIFEROL 4 MCG/2ML IV SOLN
4.0000 ug | INTRAVENOUS | Status: DC
  Administered 2012-03-04: 4 ug via INTRAVENOUS
  Filled 2012-02-29 (×2): qty 2

## 2012-02-29 MED ORDER — VITAMIN C 500 MG PO TABS
500.0000 mg | ORAL_TABLET | Freq: Every day | ORAL | Status: DC
  Administered 2012-02-29 – 2012-03-04 (×5): 500 mg via ORAL
  Filled 2012-02-29 (×6): qty 1

## 2012-02-29 MED ORDER — ALBUMIN HUMAN 5 % IV SOLN
INTRAVENOUS | Status: DC | PRN
  Administered 2012-02-29 (×3): via INTRAVENOUS

## 2012-02-29 MED ORDER — DOCUSATE SODIUM 100 MG PO CAPS
100.0000 mg | ORAL_CAPSULE | Freq: Every day | ORAL | Status: DC
  Administered 2012-03-02 – 2012-03-04 (×2): 100 mg via ORAL
  Filled 2012-02-29 (×3): qty 1

## 2012-02-29 MED ORDER — ONDANSETRON HCL 4 MG/2ML IJ SOLN: 4.0000 mg | Freq: Four times a day (QID) | INTRAMUSCULAR | Status: DC | PRN

## 2012-02-29 MED ORDER — OXYCODONE HCL 5 MG/5ML PO SOLN
ORAL | Status: AC
  Filled 2012-02-29: qty 5

## 2012-02-29 MED ORDER — METOPROLOL TARTRATE 25 MG PO TABS
37.5000 mg | ORAL_TABLET | Freq: Two times a day (BID) | ORAL | Status: DC
  Administered 2012-02-29: 37.5 mg via ORAL
  Filled 2012-02-29 (×5): qty 1

## 2012-02-29 MED ORDER — HYDRALAZINE HCL 20 MG/ML IJ SOLN: 10.0000 mg | INTRAMUSCULAR | Status: DC | PRN

## 2012-02-29 MED ORDER — PROPOFOL 10 MG/ML IV BOLUS
INTRAVENOUS | Status: DC | PRN
  Administered 2012-02-29: 100 mg via INTRAVENOUS

## 2012-02-29 MED ORDER — HYDROMORPHONE HCL PF 1 MG/ML IJ SOLN: 0.2500 mg | INTRAMUSCULAR | Status: DC | PRN

## 2012-02-29 MED ORDER — DARBEPOETIN ALFA-POLYSORBATE 100 MCG/0.5ML IJ SOLN
100.0000 ug | INTRAMUSCULAR | Status: DC
  Filled 2012-02-29: qty 0.5

## 2012-02-29 MED ORDER — PHENYLEPHRINE HCL 10 MG/ML IJ SOLN
INTRAMUSCULAR | Status: DC | PRN
  Administered 2012-02-29: 120 ug via INTRAVENOUS
  Administered 2012-02-29: 80 ug via INTRAVENOUS
  Administered 2012-02-29: 120 ug via INTRAVENOUS

## 2012-02-29 MED ORDER — ALLOPURINOL 150 MG HALF TABLET
150.0000 mg | ORAL_TABLET | Freq: Every day | ORAL | Status: DC
  Administered 2012-02-29 – 2012-03-04 (×5): 150 mg via ORAL
  Filled 2012-02-29 (×6): qty 1

## 2012-02-29 MED ORDER — ASPIRIN EC 81 MG PO TBEC
81.0000 mg | DELAYED_RELEASE_TABLET | Freq: Every day | ORAL | Status: DC
  Administered 2012-02-29 – 2012-03-04 (×5): 81 mg via ORAL
  Filled 2012-02-29 (×6): qty 1

## 2012-02-29 MED ORDER — WARFARIN SODIUM 5 MG PO TABS: 5.0000 mg | ORAL_TABLET | ORAL | Status: DC

## 2012-02-29 MED ORDER — LIDOCAINE HCL (CARDIAC) 20 MG/ML IV SOLN
INTRAVENOUS | Status: DC | PRN
  Administered 2012-02-29: 50 mg via INTRAVENOUS

## 2012-02-29 MED ORDER — SODIUM CHLORIDE 0.9 % IV SOLN
INTRAVENOUS | Status: DC
  Administered 2012-02-29 (×2): via INTRAVENOUS

## 2012-02-29 MED ORDER — METOPROLOL TARTRATE 1 MG/ML IV SOLN: 2.0000 mg | INTRAVENOUS | Status: DC | PRN

## 2012-02-29 MED ORDER — VITAMIN K1 10 MG/ML IJ SOLN
10.0000 mg | Freq: Once | INTRAVENOUS | Status: AC
  Administered 2012-02-29: 10 mg via INTRAVENOUS
  Filled 2012-02-29: qty 1

## 2012-02-29 MED ORDER — LABETALOL HCL 5 MG/ML IV SOLN: 10.0000 mg | INTRAVENOUS | Status: DC | PRN

## 2012-02-29 MED ORDER — OXYCODONE HCL 5 MG/5ML PO SOLN
5.0000 mg | Freq: Once | ORAL | Status: AC | PRN
  Administered 2012-02-29: 5 mg via ORAL

## 2012-02-29 MED ORDER — SODIUM CHLORIDE 0.9 % IV BOLUS (SEPSIS)
500.0000 mL | Freq: Once | INTRAVENOUS | Status: AC
  Administered 2012-02-29: 500 mL via INTRAVENOUS

## 2012-02-29 MED ORDER — MORPHINE SULFATE 2 MG/ML IJ SOLN
2.0000 mg | INTRAMUSCULAR | Status: DC | PRN
  Administered 2012-03-02: 2 mg via INTRAVENOUS
  Filled 2012-02-29: qty 1

## 2012-02-29 MED ORDER — MUPIROCIN 2 % EX OINT
TOPICAL_OINTMENT | Freq: Two times a day (BID) | CUTANEOUS | Status: DC
  Administered 2012-02-29: 1 via NASAL
  Filled 2012-02-29: qty 22

## 2012-02-29 MED ORDER — SIMVASTATIN 40 MG PO TABS
40.0000 mg | ORAL_TABLET | Freq: Every day | ORAL | Status: DC
  Administered 2012-02-29 – 2012-03-04 (×5): 40 mg via ORAL
  Filled 2012-02-29 (×6): qty 1

## 2012-02-29 MED ORDER — WARFARIN SODIUM 5 MG PO TABS
5.0000 mg | ORAL_TABLET | ORAL | Status: DC
  Administered 2012-03-01 – 2012-03-04 (×4): 5 mg via ORAL
  Filled 2012-02-29 (×5): qty 1

## 2012-02-29 MED ORDER — INSULIN ASPART 100 UNIT/ML ~~LOC~~ SOLN
0.0000 [IU] | Freq: Three times a day (TID) | SUBCUTANEOUS | Status: DC
  Administered 2012-02-29: 3 [IU] via SUBCUTANEOUS
  Administered 2012-03-01: 5 [IU] via SUBCUTANEOUS
  Administered 2012-03-02 (×2): 2 [IU] via SUBCUTANEOUS
  Administered 2012-03-02 – 2012-03-03 (×2): 3 [IU] via SUBCUTANEOUS
  Administered 2012-03-03: 2 [IU] via SUBCUTANEOUS
  Administered 2012-03-04: 3 [IU] via SUBCUTANEOUS

## 2012-02-29 MED ORDER — ADULT MULTIVITAMIN W/MINERALS CH
1.0000 | ORAL_TABLET | Freq: Every day | ORAL | Status: DC
  Administered 2012-02-29 – 2012-03-04 (×5): 1 via ORAL
  Filled 2012-02-29 (×6): qty 1

## 2012-02-29 MED ORDER — IBUPROFEN 200 MG PO TABS
200.0000 mg | ORAL_TABLET | Freq: Four times a day (QID) | ORAL | Status: DC | PRN
  Administered 2012-02-29: 200 mg via ORAL
  Filled 2012-02-29: qty 1

## 2012-02-29 MED ORDER — NALOXONE HCL 0.4 MG/ML IJ SOLN
0.4000 mg | INTRAMUSCULAR | Status: DC | PRN
  Administered 2012-02-29: 0.4 mg via INTRAVENOUS

## 2012-02-29 MED ORDER — 0.9 % SODIUM CHLORIDE (POUR BTL) OPTIME
TOPICAL | Status: DC | PRN
  Administered 2012-02-29: 1000 mL

## 2012-02-29 MED ORDER — PHENYLEPH-SHARK LIV OIL-MO-PET 0.25-3-14-71.9 % RE OINT
1.0000 "application " | TOPICAL_OINTMENT | Freq: Two times a day (BID) | RECTAL | Status: DC | PRN
  Filled 2012-02-29: qty 28.4

## 2012-02-29 MED ORDER — DOXYCYCLINE HYCLATE 100 MG PO TABS
100.0000 mg | ORAL_TABLET | Freq: Two times a day (BID) | ORAL | Status: DC
  Administered 2012-02-29 – 2012-03-04 (×7): 100 mg via ORAL
  Filled 2012-02-29 (×9): qty 1

## 2012-02-29 MED ORDER — GLIMEPIRIDE 2 MG PO TABS
2.0000 mg | ORAL_TABLET | Freq: Every day | ORAL | Status: DC
  Administered 2012-03-02 – 2012-03-05 (×4): 2 mg via ORAL
  Filled 2012-02-29 (×6): qty 1

## 2012-02-29 SURGICAL SUPPLY — 59 items
BANDAGE ELASTIC 4 VELCRO ST LF (GAUZE/BANDAGES/DRESSINGS) ×1 IMPLANT
BANDAGE ELASTIC 6 VELCRO ST LF (GAUZE/BANDAGES/DRESSINGS) ×2 IMPLANT
BANDAGE ESMARK 6X9 LF (GAUZE/BANDAGES/DRESSINGS) IMPLANT
BANDAGE GAUZE 4  KLING STR (GAUZE/BANDAGES/DRESSINGS) ×1 IMPLANT
BANDAGE GAUZE ELAST BULKY 4 IN (GAUZE/BANDAGES/DRESSINGS) IMPLANT
BNDG CMPR 9X6 STRL LF SNTH (GAUZE/BANDAGES/DRESSINGS) ×1
BNDG COHESIVE 6X5 TAN STRL LF (GAUZE/BANDAGES/DRESSINGS) ×2 IMPLANT
BNDG ESMARK 6X9 LF (GAUZE/BANDAGES/DRESSINGS) ×2
CANISTER SUCTION 2500CC (MISCELLANEOUS) ×2 IMPLANT
CLIP TI MEDIUM 6 (CLIP) IMPLANT
CLOTH BEACON ORANGE TIMEOUT ST (SAFETY) ×2 IMPLANT
COVER SURGICAL LIGHT HANDLE (MISCELLANEOUS) ×2 IMPLANT
CUFF TOURNIQUET SINGLE 34IN LL (TOURNIQUET CUFF) ×1 IMPLANT
CUFF TOURNIQUET SINGLE 44IN (TOURNIQUET CUFF) IMPLANT
DRAIN CHANNEL 15F RND FF W/TCR (WOUND CARE) ×1 IMPLANT
DRAIN CHANNEL 19F RND (DRAIN) IMPLANT
DRAPE ORTHO SPLIT 77X108 STRL (DRAPES) ×4
DRAPE PROXIMA HALF (DRAPES) ×2 IMPLANT
DRAPE SURG ORHT 6 SPLT 77X108 (DRAPES) ×2 IMPLANT
DRSG ADAPTIC 3X8 NADH LF (GAUZE/BANDAGES/DRESSINGS) ×2 IMPLANT
ELECT REM PT RETURN 9FT ADLT (ELECTROSURGICAL) ×2
ELECTRODE REM PT RTRN 9FT ADLT (ELECTROSURGICAL) ×1 IMPLANT
EVACUATOR SILICONE 100CC (DRAIN) ×1 IMPLANT
GAUZE SPONGE 4X4 16PLY XRAY LF (GAUZE/BANDAGES/DRESSINGS) ×1 IMPLANT
GLOVE BIO SURGEON STRL SZ 6.5 (GLOVE) ×3 IMPLANT
GLOVE BIOGEL PI IND STRL 6.5 (GLOVE) IMPLANT
GLOVE BIOGEL PI IND STRL 7.5 (GLOVE) ×1 IMPLANT
GLOVE BIOGEL PI INDICATOR 6.5 (GLOVE) ×1
GLOVE BIOGEL PI INDICATOR 7.5 (GLOVE) ×2
GLOVE ECLIPSE 7.5 STRL STRAW (GLOVE) ×2 IMPLANT
GLOVE SURG SS PI 7.5 STRL IVOR (GLOVE) ×3 IMPLANT
GOWN PREVENTION PLUS XLARGE (GOWN DISPOSABLE) ×2 IMPLANT
GOWN PREVENTION PLUS XXLARGE (GOWN DISPOSABLE) ×2 IMPLANT
GOWN STRL NON-REIN LRG LVL3 (GOWN DISPOSABLE) ×4 IMPLANT
KIT BASIN OR (CUSTOM PROCEDURE TRAY) ×2 IMPLANT
KIT ROOM TURNOVER OR (KITS) ×2 IMPLANT
NS IRRIG 1000ML POUR BTL (IV SOLUTION) ×2 IMPLANT
PACK GENERAL/GYN (CUSTOM PROCEDURE TRAY) ×2 IMPLANT
PAD ARMBOARD 7.5X6 YLW CONV (MISCELLANEOUS) ×2 IMPLANT
PADDING CAST COTTON 6X4 STRL (CAST SUPPLIES) ×1 IMPLANT
SAW GIGLI STERILE 20 (MISCELLANEOUS) ×2 IMPLANT
SPONGE GAUZE 4X4 12PLY (GAUZE/BANDAGES/DRESSINGS) ×2 IMPLANT
SPONGE LAP 18X18 X RAY DECT (DISPOSABLE) ×1 IMPLANT
STAPLER VISISTAT 35W (STAPLE) ×2 IMPLANT
STOCKINETTE IMPERVIOUS LG (DRAPES) ×2 IMPLANT
SUT BONE WAX W31G (SUTURE) ×1 IMPLANT
SUT ETHILON 3 0 PS 1 (SUTURE) ×1 IMPLANT
SUT SILK 0 TIES 10X30 (SUTURE) ×1 IMPLANT
SUT SILK 2 0 (SUTURE) ×2
SUT SILK 2 0 SH CR/8 (SUTURE) ×3 IMPLANT
SUT SILK 2-0 18XBRD TIE 12 (SUTURE) ×1 IMPLANT
SUT SILK 3 0 (SUTURE)
SUT SILK 3-0 18XBRD TIE 12 (SUTURE) IMPLANT
SUT VIC AB 2-0 CT1 18 (SUTURE) ×6 IMPLANT
TAPE UMBILICAL COTTON 1/8X30 (MISCELLANEOUS) ×2 IMPLANT
TOWEL OR 17X24 6PK STRL BLUE (TOWEL DISPOSABLE) ×3 IMPLANT
TOWEL OR 17X26 10 PK STRL BLUE (TOWEL DISPOSABLE) ×2 IMPLANT
UNDERPAD 30X30 INCONTINENT (UNDERPADS AND DIAPERS) ×2 IMPLANT
WATER STERILE IRR 1000ML POUR (IV SOLUTION) ×2 IMPLANT

## 2012-02-29 NOTE — Anesthesia Procedure Notes (Signed)
Procedure Name: LMA Insertion Date/Time: 02/29/2012 9:57 AM Performed by: Carmela Rima Pre-anesthesia Checklist: Patient identified, Timeout performed, Emergency Drugs available, Suction available and Patient being monitored Patient Re-evaluated:Patient Re-evaluated prior to inductionOxygen Delivery Method: Circle system utilized Preoxygenation: Pre-oxygenation with 100% oxygen Intubation Type: IV induction Ventilation: Mask ventilation without difficulty LMA: LMA inserted LMA Size: 4.0 Tube type: Oral Number of attempts: 1 Placement Confirmation: positive ETCO2 Tube secured with: Tape Dental Injury: Teeth and Oropharynx as per pre-operative assessment

## 2012-02-29 NOTE — Telephone Encounter (Addendum)
02/29/12- lm for pt, dpm   Message copied by Fredrich Birks on Fri Feb 29, 2012  3:42 PM ------      Message from: Sharee Pimple      Created: Fri Feb 29, 2012  2:41 PM      Regarding: schedule                   ----- Message -----         From: Dara Lords, PA         Sent: 02/29/2012   2:36 PM           To: Sharee Pimple, CMA            S/p right BKA 02/29/12 by [name].  F/u in 4 weeks for staple removal.            Thanks,      Lelon Mast ------

## 2012-02-29 NOTE — Interval H&P Note (Signed)
History and Physical Interval Note:  02/29/2012 9:45 AM  Alan [name]  has presented today for surgery, with the diagnosis of Nonhealing surgical wound of right metatarsal Peripheral Vascular Disease  The various methods of treatment have been discussed with the patient and family. After consideration of risks, benefits and other options for treatment, the patient has consented to  Procedure(s) with comments: AMPUTATION BELOW KNEE (Right) - Right BKA as a surgical intervention .  The patient's history has been reviewed, patient examined, no change in status, stable for surgery.  I have reviewed the patient's chart and labs.  Questions were answered to the patient's satisfaction.     Jarita Raval IV, V. WELLS

## 2012-02-29 NOTE — Anesthesia Postprocedure Evaluation (Signed)
  Anesthesia Post-op Note  Patient: Alan [name]  Procedure(s) Performed: Procedure(s) with comments: AMPUTATION BELOW KNEE (Right) - Right BKA  Patient Location: PACU  Anesthesia Type:General  Level of Consciousness: awake  Airway and Oxygen Therapy: Patient Spontanous Breathing  Post-op Pain: mild  Post-op Assessment: Post-op Vital signs reviewed, Patient's Cardiovascular Status Stable, Respiratory Function Stable, Patent Airway, No signs of Nausea or vomiting and Pain level controlled  Post-op Vital Signs: stable  Complications: No apparent anesthesia complications

## 2012-02-29 NOTE — OR Nursing (Signed)
Upon pre-operative assessment, Alan [name]  Stated that he had a glass of milk and a [place] sandwich at 0530 this morning. [facility] nursing [facility] was called, and spoke with Shaune Pollack RN that confirmed patient was NPO since midnight.

## 2012-02-29 NOTE — Progress Notes (Signed)
PHARMACY COMMUNICATION  Order received for Vancomycin 1gm IV Q12 x 2 doses post-operatively.  He has been on Vancomycin at outpatient HD (per Dr. Scherrie Gerlach note) and also received Vancomycin 1.5gm preoperatively.  He will not require any additional Vancomycin for post-operative coverage since his next HD session is scheduled for 2/22.  The post-op Vanc order has been discontinued per protocol.  Estella Husk, Pharm.D., BCPS Clinical Pharmacist  Phone [phone] Pager [phone] 02/29/2012, 5:06 PM

## 2012-02-29 NOTE — Transfer of Care (Signed)
Immediate Anesthesia Transfer of Care Note  Patient: Alan [name]  Procedure(s) Performed: Procedure(s) with comments: AMPUTATION BELOW KNEE (Right) - Right BKA  Patient Location: PACU  Anesthesia Type:General  Level of Consciousness: awake  Airway & Oxygen Therapy: Patient Spontanous Breathing and Patient connected to face mask oxygen  Post-op Assessment: Report given to PACU RN, Post -op Vital signs reviewed and stable and Patient moving all extremities X 4  Post vital signs: Reviewed and stable  Complications: No apparent anesthesia complications

## 2012-02-29 NOTE — Anesthesia Preprocedure Evaluation (Addendum)
Anesthesia Evaluation  Patient identified by MRN, date of birth, ID band Patient awake    Reviewed: Allergy & Precautions, H&P , NPO status , Patient's Chart, lab work & pertinent test results  Airway Mallampati: II TM Distance: >3 FB Neck ROM: full    Dental   Pulmonary shortness of breath, pneumonia -, resolved, former smoker,  + rhonchi         Cardiovascular hypertension, Pt. on medications and Pt. on home beta blockers + CAD, + CABG and + Peripheral Vascular Disease + dysrhythmias Atrial Fibrillation + Valvular Problems/Murmurs AS Rhythm:Irregular Rate:Normal     Neuro/Psych negative psych ROS   GI/Hepatic negative GI ROS, Neg liver ROS,   Endo/Other  diabetes, Type 2, Oral Hypoglycemic Agents  Renal/GU ESRF and DialysisRenal disease     Musculoskeletal  (+) Arthritis -, Osteoarthritis,    Abdominal   Peds  Hematology negative hematology ROS (+)   Anesthesia Other Findings   Reproductive/Obstetrics negative OB ROS                           Anesthesia Physical Anesthesia Plan  ASA: III  Anesthesia Plan: General   Post-op Pain Management:    Induction: Intravenous  Airway Management Planned: LMA  Additional Equipment:   Intra-op Plan:   Post-operative Plan: Extubation in OR  Informed Consent: I have reviewed the patients History and Physical, chart, labs and discussed the procedure including the risks, benefits and alternatives for the proposed anesthesia with the patient or authorized representative who has indicated his/her understanding and acceptance.     Plan Discussed with: CRNA and Surgeon  Anesthesia Plan Comments:         Anesthesia Quick Evaluation

## 2012-02-29 NOTE — Progress Notes (Signed)
Pt admitted to 3300 from PACU with blood infusing. Oriented to unit and room. Safety discussed and call bell with in reach. Wife at bedside, Pt is confused. Bed alarm set.   Elijah Birk, RN

## 2012-02-29 NOTE — Progress Notes (Signed)
Pt is confused and pulling at JP drain. Notifed md and received ordered for restraints. Bilateral wrist restraints placed. Will continue to monitor

## 2012-02-29 NOTE — H&P (View-Only) (Signed)
[facility] [place] [facility] Office Note  CC: Nonhealing right transmetatarsal amputation Referring Physician: Brabham  History of Present Illness: 79 year old male patient [name] who is status post a right transmetatarsal amputation February 15 2012. The patient has been in a facility for rehabilitation. The patient's wife called the office asking for the patient be seen due to a nonhealing wound. The patient does not report great amounts of pain however there is drainage and open surgical bed.  Past Medical History  Diagnosis Date  . Diabetes mellitus   . Gout   . Hypertension   . Hyperlipidemia   . Arthritis   . Prostate cancer     s/p seed implant  . Cataract     BILATERAL-BEEN REMOVED  . Neuromuscular disorder     DABETIC NEUROPATHY-LOWER EXTREMITY  . Atrial fibrillation     on coumadin  . Aortic stenosis     moderate to severe, not felt to be a surgical candidate  . Anemia     secondary to end stage renal disease  . Coronary artery disease     remote CABG in 1996; last stress test in 2005; last cath 2005  . Diabetic neuropathy   . Edema   . PAD (peripheral artery disease)   . Diabetic retinopathy(362.0)   . Chronic anticoagulation     on coumadin  . Shortness of breath   . Chronic kidney disease     Started dialysis October 2012  . Nephrolithiasis     prior history of percutaneous nephrostomy  . Myocardial infarction     1999 ish    ROS: [x]  Positive   [ ]  Denies    General: [ ]  Weight loss, [ ]  Fever, [ ]  chills Neurologic: [ ]  Dizziness, [ ]  Blackouts, [ ]  Seizure [ ]  Stroke, [ ]  "Mini stroke", [ ]  Slurred speech, [ ]  Temporary blindness; [ ]  weakness in arms or legs, [ ]  Hoarseness Cardiac: [ ]  Chest pain/pressure, [ ]  Shortness of breath at rest [ ]  Shortness of breath with exertion, [ ]  Atrial fibrillation or irregular heartbeat Vascular: [ ]  Pain in legs with walking, [ ]  Pain in legs at rest, [ ]  Pain in legs at night,  [ ]   Non-healing ulcer, [ ]  Blood clot in vein/DVT,   Pulmonary: [ ]  Home oxygen, [ ]  Productive cough, [ ]  Coughing up blood, [ ]  Asthma,  [ ]  Wheezing Musculoskeletal:  [ ]  Arthritis, [ ]  Low back pain, [ ]  Joint pain Hematologic: [ ]  Easy Bruising, [ ]  Anemia; [ ]  Hepatitis Gastrointestinal: [ ]  Blood in stool, [ ]  Gastroesophageal Reflux/heartburn, [ ]  Trouble swallowing Urinary: [ ]  chronic Kidney disease, [ ]  on HD - [ ]  MWF or [ ]  TTHS, [ ]  Burning with urination, [ ]  Difficulty urinating Skin: [ ]  Rashes, [ ]  Wounds Psychological: [ ]  Anxiety, [ ]  Depression   Social History History  Substance Use Topics  . Smoking status: Former Smoker -- 2 years    Types: Cigarettes    Quit date: 04/10/1968  . Smokeless tobacco: Former Neurosurgeon    Types: Chew  . Alcohol Use: No    Family History Family History  Problem Relation Age of Onset  . Heart disease Father   . Hypertension Brother   . Stroke Brother   . Diabetes Mother     Pt. not sure, but he thinks she did.    Allergies  Allergen Reactions  . Ancef (Cefazolin) Rash  Patient cant recall if allergy to this med exists.     Current Outpatient Prescriptions  Medication Sig Dispense Refill  . allopurinol (ZYLOPRIM) 300 MG tablet Take 0.5 tablets (150 mg total) by mouth daily.  30 tablet  1  . aspirin EC 81 MG tablet Take 1 tablet (81 mg total) by mouth daily.      . calcium carbonate (TUMS - DOSED IN MG ELEMENTAL CALCIUM) 500 MG chewable tablet Chew 1 tablet by mouth 3 (three) times daily with meals. Phosphorous binder      . enoxaparin (LOVENOX) 100 MG/ML injection Inject 1 mL (100 mg total) into the skin daily.  3 Syringe  3  . estazolam (PROSOM) 2 MG tablet Take 1 mg by mouth at bedtime as needed. For sleep      . glimepiride (AMARYL) 4 MG tablet Take 2 mg by mouth daily with breakfast.      . ibuprofen (ADVIL,MOTRIN) 200 MG tablet Take 200 mg by mouth every 6 (six) hours as needed. For pain      . metoprolol tartrate  (LOPRESSOR) 25 MG tablet Take 37.5 mg by mouth 2 (two) times daily. Takes 1.5 tablets twice daily      . oxyCODONE (ROXICODONE) 5 MG immediate release tablet Take 1 tablet (5 mg total) by mouth every 6 (six) hours as needed for pain.  30 tablet  0  . simvastatin (ZOCOR) 40 MG tablet Take 1 tablet (40 mg total) by mouth at bedtime.  30 tablet  1  . warfarin (COUMADIN) 5 MG tablet Take 5 mg by mouth See admin instructions. Patient takes 1 tablet every day, except Friday-takes 1/2 tablet.       No current facility-administered medications for this visit.    Physical Examination  Filed Vitals:   02/25/12 1525  BP: 108/64  Pulse: 91  Temp: 99 F (37.2 C)  Resp: 18    Body mass index is 30.78 kg/(m^2).  General:  WDWN in NAD Gait: Normal HEENT: WNL Eyes: Pupils equal Pulmonary: normal non-labored breathing , without Rales, rhonchi,  wheezing Cardiac: RRR, without  Murmurs, rubs or gallops; No carotid bruits Abdomen: soft, NT, no masses Skin: no rashes, ulcers noted Vascular Exam/Pulses: Status post left BKA, right lower extremity pulses are not palpated. [name] examine the patient and explained to the patient and his wife at length that he does not feel that this will heal and a BKA is necessary. The surgical bed is open the sutures released on their own there is an eschar with nonhealing tissue.  Extremities without ischemic changes, no Gangrene , no cellulitis; no open wounds;  Musculoskeletal: no muscle wasting or atrophy  Neurologic: A&O X 3; Appropriate Affect ; SENSATION: normal; MOTOR FUNCTION:  moving all extremities equally. Speech is fluent/normal    ASSESSMENT/PLAN: Nonhealing right transmetatarsal amputation. [name] spent 30 minutes speaking with the patient and his wife regarding the need for right BKA. This will be scheduled for Friday there were 21st 2014 with [name]. The patient's Coumadin will be stopped today. The patient and his wife understand the  patient will need return to the skilled nursing facility until surgery. The patient's disposition will be pending his hospital course. The patient and his wife's questions were encouraged and answered, they're in agreement with this plan, the surgery is again  tentatively scheduled for Friday, February 21.  Lauree Chandler ANP  , M.D.: Myra Gianotti

## 2012-02-29 NOTE — Preoperative (Signed)
Beta Blockers   Reason not to administer Beta Blockers:Not Applicable 

## 2012-02-29 NOTE — Telephone Encounter (Addendum)
02/29/12- lm with patient to notify of the appointment, dpm   Message copied by Fredrich Birks on Fri Feb 29, 2012  3:34 PM ------      Message from: Sharee Pimple      Created: Fri Feb 29, 2012  3:10 PM      Regarding: schedule                   ----- Message -----         From: Dara Lords, PA         Sent: 02/29/2012   2:36 PM           To: Sharee Pimple, CMA            S/p right BKA 02/29/12 by [name].  F/u in 4 weeks for staple removal.            Thanks,      Lelon Mast ------

## 2012-02-29 NOTE — Op Note (Signed)
[facility]  Patient name: Alan [name] MRN: [id number] DOB: April 08, 1931 Sex: male  02/29/2012 Pre-operative Diagnosis: Ischemic right foot Post-operative diagnosis:  Same Surgeon:  Jorge Ny Assistants:  Doreatha Massed Procedure:   Right below knee amputation Anesthesia:  Gen. Blood Loss:  See anesthesia record Specimens:  Right leg  Findings:  Viable muscle with adequate bleeding.  Indications:  The patient has had to deal with nonhealing ulcers on the right foot. Several attempts at percutaneous revascularization have been tried but were on accessible. He is not a candidate for surgical revascularization. He is undergone a toe amputation which did not heal he then went on to have a transmetatarsal amputation. I recently saw him in the office and this had broken down and was not salvageable. He now comes in for below knee amputation.  Procedure:  The patient was identified in the holding area and taken to [facility] OR ROOM 16  The patient was then placed supine on the table. general anesthesia was administered.  The patient was prepped and draped in the usual sterile fashion.  A time out was called and antibiotics were administered.  A site 12 cm below the tibial tuberosity was selected. Circumferential measurement was performed. A posterior flap was created going two thirds of the circumference around the leg, one third inferiorly and then posteriorly. Cautery was used to divide the subcutaneous tissue. The patient had moderate bleeding from his tissue bed which prompted me to place a tourniquet above the knee. The muscle was then divided with cautery. I created a space posterior to to the tibia. A periosteal elevator was used to elevate the periosteum. The fibula was also dissected free. A periosteal elevator elevated the periosteum. Next, a Gigli saw was used to transect the tibia this was beveled anteriorly. Bone cutters were used to transect the tibia.  A hemostat was placed across the neurovascular bundles. An amputation knife was used to transect the remaining portion of the muscle. The right leg was removed from the table. The wound was copiously radiated. Multiple sutures were placed to assist with hemostasis. The neurovascular bundle was dissected free. This was ligated with a 0 silk tie, proximal to the cut edge of the tibia. I also used [employer] to trim back the fibula so that it was proximal to the tibia. Once hemostasis was achieved, the fascia was reapproximated with interrupted 2-0 Vicryl. The skin was closed with staples. I did placed a 15 Blake drain that was brought out through a separate stab incision. Sterile dressings were applied. There no complications.   Disposition:  To PACU in stable condition.   Juleen China, M.D. [facility] [place] Office: [phone] Pager:  [phone]

## 2012-02-29 NOTE — Consult Note (Signed)
[facility] Renal Consultation Note  Indication for Consultation:  Management of ESRD/hemodialysis; anemia, hypertension/volume and secondary hyperparathyroidism  HPI: Alan [name] is a 79 y.o. male with ESRD secondary to DM /HTN, CABG 1996, Mod /severe Aor. Stenosis/ A. Fib ( on Coumadin), severe PVD  Sp  Left bka ( Jan 2013), admitted by [name]  Post right BKA  With prior feb 7  R transmet amp not healing . Now in Recovery Room with AMS more than his usual. Yesterday at [facility] he was confused more than his usual .(on Vancomycin at hd for infected R Transmet)  Now nonverbal but moves all extremities and rarely opens his eyes in the recovery room.       Past Medical History  Diagnosis Date  . Diabetes mellitus   . Gout   . Hypertension   . Hyperlipidemia   . Arthritis   . Prostate cancer     s/p seed implant  . Cataract     BILATERAL-BEEN REMOVED  . Neuromuscular disorder     DABETIC NEUROPATHY-LOWER EXTREMITY  . Atrial fibrillation     on coumadin  . Aortic stenosis     moderate to severe, not felt to be a surgical candidate  . Anemia     secondary to end stage renal disease  . Coronary artery disease     remote CABG in 1996; last stress test in 2005; last cath 2005  . Diabetic neuropathy   . Edema   . PAD (peripheral artery disease)   . Diabetic retinopathy(362.0)   . Chronic anticoagulation     on coumadin  . Shortness of breath   . Chronic kidney disease     Started dialysis October 2012  . Nephrolithiasis     prior history of percutaneous nephrostomy  . Myocardial infarction     1999 ish    Past Surgical History  Procedure Laterality Date  . Ventral hernia repair    . Inguinal hernia repair      right side  . Colonoscopy    . Kidney stone surgery    . Polypectomy    . Tonsillectomy    . Toe surgery      removal little toe right foot  . Cardiac catheterization  04/19/2003    SEVERE 2 VESSEL OBSTRUCTIVE  ATHERSCLEROTIC CAD  . Coronary artery bypass graft  1996    LIMA GRAFT TO THE LAD, SEQUENTIAL SAPHENOUS  VEIN GRAFT TO THE FIRST AND SECOND DIAGONIAL BRANCHES, SEQUENTIAL SAPHENOUS VEIN GRAFT TO THE ACUTE MARGINAL, POSTERIOR DESCENDING, AND POSTERIOR LATERAL BRANCHES OF THE RIGHT CORONARY ARTERY  . Radioactive seed implant    . US echocardiography  09/06/2009    EF 55-60%  . Cardiovascular stress test  04/13/2003    EF 54%. EVIDENCE OF ANTERO-APICAL ISCHEMIA. NORMAL LV SIZE AND FUNCTION  . Tee without cardioversion  05/09/2011    Procedure: TRANSESOPHAGEAL ECHOCARDIOGRAM (TEE);  Surgeon: Laurey Morale, MD;  Location: [facility] ENDOSCOPY;  Service: Cardiovascular;  Laterality: N/A;  . Hernia repair    . Amputation  07/20/2011    Procedure: AMPUTATION BELOW KNEE;  Surgeon: Nada Libman, MD;  Location: [facility] OR;  Service: Vascular;  Laterality: Left;  . Amputation  01/25/2012    Procedure: AMPUTATION DIGIT;  Surgeon: Chuck Hint, MD;  Location: [facility] OR;  Service: Vascular;  Laterality: Right;  second  . Av fistula placement      right  . Transmetatarsal amputation Right 02/15/2012  Procedure: TRANSMETATARSAL AMPUTATION;  Surgeon: Nada Libman, MD;  Location: [facility] OR;  Service: Vascular;  Laterality: Right;      Family History  Problem Relation Age of Onset  . Heart disease Father   . Hypertension Brother   . Stroke Brother   . Diabetes Mother     Pt. not sure, but he thinks she did. He had 2 brothers and 1 sis--2 bros died of "heart." Daughter Marylene Land and son Vershawn, [place]. Are healthy.   Social History -- He lives in [facility]   He grew up in [place], [place].  [name]  HS grad.  [employer]  for 1 year.  Then joined [employer].  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  Married 52 years.  Smoked cigarettes for 30 yr--quit 30 yr ago.  Quit drinking 30 yr ago    Allergies  Allergen Reactions  . Ancef (Cefazolin) Rash    Patient cant recall if allergy to this med exists.     Prior to Admission medications    Medication Sig Start Date End Date Taking? Authorizing Provider  acetaminophen (TYLENOL) 500 MG tablet Take 1,000 mg by mouth every 8 (eight) hours as needed for pain.   Yes Historical Provider, MD  allopurinol (ZYLOPRIM) 300 MG tablet Take 0.5 tablets (150 mg total) by mouth daily. 02/06/12  Yes Mcarthur Rossetti Angiulli, PA  aspirin EC 81 MG tablet Take 1 tablet (81 mg total) by mouth daily. 02/06/12  Yes Daniel J Angiulli, PA  calcium carbonate (TUMS - DOSED IN MG ELEMENTAL CALCIUM) 500 MG chewable tablet Chew 1 tablet by mouth 3 (three) times daily with meals. Phosphorous binder   Yes Historical Provider, MD  doxycycline (VIBRA-TABS) 100 MG tablet Take 100 mg by mouth 2 (two) times daily. For 10 days starting on 02/24/12   Yes Historical Provider, MD  estazolam (PROSOM) 1 MG tablet Take 1 mg by mouth at bedtime as needed (for sleep).   Yes Historical Provider, MD  glimepiride (AMARYL) 2 MG tablet Take 2 mg by mouth daily before breakfast.   Yes Historical Provider, MD  ibuprofen (ADVIL,MOTRIN) 200 MG tablet Take 200 mg by mouth every 6 (six) hours as needed. For pain   Yes Historical Provider, MD  metoprolol tartrate (LOPRESSOR) 25 MG tablet Take 37.5 mg by mouth 2 (two) times daily. Takes 1.5 tablets twice daily   Yes Historical Provider, MD  Multiple Vitamin (MULTIVITAMIN WITH MINERALS) TABS Take 1 tablet by mouth daily.   Yes Historical Provider, MD  oxyCODONE (ROXICODONE) 5 MG immediate release tablet Take 1 tablet (5 mg total) by mouth every 6 (six) hours as needed for pain. 02/20/12  Yes Dara Lords, PA  phenylephrine-shark liver oil-mineral oil-petrolatum (PREPARATION H) 0.25-3-14-71.9 % rectal ointment Place 1 application rectally 2 (two) times daily as needed for hemorrhoids.   Yes Historical Provider, MD  simvastatin (ZOCOR) 40 MG tablet Take 1 tablet (40 mg total) by mouth at bedtime. 02/06/12  Yes Daniel J Angiulli, PA  vitamin C (ASCORBIC ACID) 500 MG tablet Take 500 mg by mouth daily.   Yes  Historical Provider, MD  warfarin (COUMADIN) 5 MG tablet Take 5 mg by mouth See admin instructions. Patient takes 1 tablet every day, except Friday-takes 1/2 tablet.    Historical Provider, MD      Results for orders placed during the hospital encounter of 02/29/12 (from the past 48 hour(s))  APTT     Status: Abnormal   Collection Time  02/29/12  7:28 AM      Result Value Range   aPTT 54 (*) 24 - 37 seconds   Comment:            IF BASELINE aPTT IS ELEVATED,     SUGGEST PATIENT RISK ASSESSMENT     BE USED TO DETERMINE APPROPRIATE     ANTICOAGULANT THERAPY.  BASIC METABOLIC PANEL     Status: Abnormal   Collection Time    02/29/12  7:28 AM      Result Value Range   Sodium 135  135 - 145 mEq/L   Potassium 3.2 (*) 3.5 - 5.1 mEq/L   Chloride 95 (*) 96 - 112 mEq/L   CO2 31  19 - 32 mEq/L   Glucose, Bld 137 (*) 70 - 99 mg/dL   BUN 22  6 - 23 mg/dL   Creatinine, Ser 1.61 (*) 0.50 - 1.35 mg/dL   Calcium 8.4  8.4 - 09.6 mg/dL   GFR calc non Af Amer 11 (*) >90 mL/min   GFR calc Af Amer 13 (*) >90 mL/min   Comment:            The eGFR has been calculated     using the CKD EPI equation.     This calculation has not been     validated in all clinical     situations.     eGFR's persistently     <90 mL/min signify     possible Chronic Kidney Disease.  CBC     Status: Abnormal   Collection Time    02/29/12  7:28 AM      Result Value Range   WBC 11.6 (*) 4.0 - 10.5 K/uL   RBC 3.54 (*) 4.22 - 5.81 MIL/uL   Hemoglobin 9.7 (*) 13.0 - 17.0 g/dL   HCT 04.5 (*) 40.9 - 81.1 %   MCV 90.1  78.0 - 100.0 fL   MCH 27.4  26.0 - 34.0 pg   MCHC 30.4  30.0 - 36.0 g/dL   RDW 91.4  78.2 - 95.6 %   Platelets 341  150 - 400 K/uL  PROTIME-INR     Status: Abnormal   Collection Time    02/29/12  7:28 AM      Result Value Range   Prothrombin Time 20.3 (*) 11.6 - 15.2 seconds   INR 1.81 (*) 0.00 - 1.49  SURGICAL PCR SCREEN     Status: None   Collection Time    02/29/12  7:59 AM      Result Value  Range   MRSA, PCR NEGATIVE  NEGATIVE   Staphylococcus aureus NEGATIVE  NEGATIVE   Comment:            The Xpert SA Assay (FDA     approved for NASAL specimens     in patients over 23 years of age),     is one component of     a comprehensive surveillance     program.  Test performance has     been validated by [employer] for patients greater     than or equal to 17 year old.     It is not intended     to diagnose infection nor to     guide or monitor treatment.  GLUCOSE, CAPILLARY     Status: Abnormal   Collection Time    02/29/12  8:25 AM      Result Value Range   Glucose-Capillary  114 (*) 70 - 99 mg/dL  TYPE AND SCREEN     Status: None   Collection Time    02/29/12 10:47 AM      Result Value Range   ABO/RH(D) O POS     Antibody Screen NEG     Sample Expiration 03/03/2012     Unit Number [id number]     Blood Component Type RED CELLS,LR     Unit division 00     Status of Unit ALLOCATED     Transfusion Status OK TO TRANSFUSE     Crossmatch Result Compatible     Unit Number [id number]     Blood Component Type RED CELLS,LR     Unit division 00     Status of Unit ISSUED     Transfusion Status OK TO TRANSFUSE     Crossmatch Result Compatible     Unit Number [id number]     Blood Component Type RED CELLS,LR     Unit division 00     Status of Unit ALLOCATED     Transfusion Status OK TO TRANSFUSE     Crossmatch Result Compatible     Unit Number [id number]     Blood Component Type RED CELLS,LR     Unit division 00     Status of Unit ALLOCATED     Transfusion Status OK TO TRANSFUSE     Crossmatch Result Compatible    ABO/RH     Status: None   Collection Time    02/29/12 10:47 AM      Result Value Range   ABO/RH(D) O POS    PREPARE FRESH FROZEN PLASMA     Status: None   Collection Time    02/29/12 11:07 AM      Result Value Range   Unit Number [id number]     Blood Component Type THAWED PLASMA     Unit division 00     Status of Unit ISSUED      Transfusion Status OK TO TRANSFUSE    POCT I-STAT 4, (NA,K, GLUC, HGB,HCT)     Status: Abnormal   Collection Time    02/29/12 11:28 AM      Result Value Range   Sodium 137  135 - 145 mEq/L   Potassium 3.4 (*) 3.5 - 5.1 mEq/L   Glucose, Bld 112 (*) 70 - 99 mg/dL   HCT 66.0 (*) 63.0 - 16.0 %   Hemoglobin 8.5 (*) 13.0 - 17.0 g/dL  PREPARE RBC (CROSSMATCH)     Status: None   Collection Time    02/29/12  1:00 PM      Result Value Range   Order Confirmation ORDER PROCESSED BY BLOOD BANK    GLUCOSE, CAPILLARY     Status: Abnormal   Collection Time    02/29/12  1:09 PM      Result Value Range   Glucose-Capillary 118 (*) 70 - 99 mg/dL     ROS: Unable to obtain from pt. Sec to his AMS.   Physical Exam: Filed Vitals:   02/29/12 1400  BP: 101/44  Pulse: 114  Temp:   Resp: 18     General: Chronically ill AAM, in recovery room, nonverbal, moving arms  Grabbing at bed rail. HEENT: [place]  MMdry Eyes: not examioned Neck: no jvd Heart: tachy regular, 2/6 sem apex, no rub Lungs: CTA grossly Abdomen: BS pos. Soft, nontender Extremities: bilat BKAs with right surgical dressing  dry Skin: no overt rash Neuro: nonverbal, moving all extrm. Independently/  will not follow requests or answer questions Dialysis Access: pos. Bruit R U A AVF  Dialysis Orders: Center: [facility]  on TTS . EDW  HD Bath 2.0 K, 2.25 Ca   Time 4hrs Heparin 3,400 units. Access R U A AVF BFR  400 DFR 800    Hectoral 4.o mcg mcg IV/HD( ipth 351 02/07/12)  Epogen 6000   Units IV/HD  Venofer  0  Ferritn 1108 02/07/12 19 % tfs   Other Vancomycin 1.0gm   Assessment/Plan 1. Right BKA = [name] rx  2. AMS=  Prob. Sec to Infected R Trans Met site  / now  amputated 3. ESRD -  TTS ( [facility]) K 3.4 / Use 2.0 k bath / check k pre hd in am as he is getting blood in recovery room sec "Bleeding in OR with high inr" 4. Hypertension/volume  - bp 111/58 in recovery rm. 5. A. Fib= Coumadin reported held  Since 17th , 1.81 INR   6. Anemia  - hgb=9.7 pre op , 82. Post op istat , getting 2 u prbcs post op , fu cbc in am pre hd/  Aranesp  On hd give in am then weekly thursday 7. Metabolic bone disease -   Fu am ca/ phos Hectoral in hd / tums  Ac when on po2 8. DM type 2= AC with sliding scale  Lenny Pastel, PA-C [facility] Beeper [phone] 02/29/2012, 2:05 PM  I spoke with Mr. And Mrs. Tuller (he's agitated coming out of PACU).  He gets dialysis at [facility] T-Th-Sat.  He's scheduled for HD tomorrow.  Willl need new EDW with BKA.   Agree with plans as outlined above.

## 2012-03-01 LAB — GLUCOSE, CAPILLARY
Glucose-Capillary: 167 mg/dL — ABNORMAL HIGH (ref 70–99)
Glucose-Capillary: 111 mg/dL — ABNORMAL HIGH (ref 70–99)
Glucose-Capillary: 210 mg/dL — ABNORMAL HIGH (ref 70–99)

## 2012-03-01 LAB — CBC
HCT: 34.7 % — ABNORMAL LOW (ref 39.0–52.0)
Hemoglobin: 11.3 g/dL — ABNORMAL LOW (ref 13.0–17.0)
MCHC: 32.3 g/dL (ref 30.0–36.0)
MCHC: 32.6 g/dL (ref 30.0–36.0)
RBC: 3.9 MIL/uL — ABNORMAL LOW (ref 4.22–5.81)
RDW: 14.7 % (ref 11.5–15.5)
WBC: 10.1 10*3/uL (ref 4.0–10.5)

## 2012-03-01 LAB — RENAL FUNCTION PANEL
CO2: 29 mEq/L (ref 19–32)
Calcium: 8.2 mg/dL — ABNORMAL LOW (ref 8.4–10.5)
Chloride: 97 mEq/L (ref 96–112)
GFR calc Af Amer: 10 mL/min — ABNORMAL LOW (ref >90)
GFR calc non Af Amer: 8 mL/min — ABNORMAL LOW (ref >90)
Glucose, Bld: 84 mg/dL (ref 70–99)
Potassium: 4.3 mEq/L (ref 3.5–5.1)
Sodium: 136 mEq/L (ref 135–145)

## 2012-03-01 LAB — BASIC METABOLIC PANEL
BUN: 29 mg/dL — ABNORMAL HIGH (ref 6–23)
Calcium: 8.1 mg/dL — ABNORMAL LOW (ref 8.4–10.5)
Creatinine, Ser: 5.86 mg/dL — ABNORMAL HIGH (ref 0.50–1.35)
GFR calc Af Amer: 10 mL/min — ABNORMAL LOW (ref >90)
GFR calc non Af Amer: 8 mL/min — ABNORMAL LOW (ref >90)
Potassium: 3.9 mEq/L (ref 3.5–5.1)

## 2012-03-01 LAB — PROTIME-INR
INR: 1.23 (ref 0.00–1.49)
Prothrombin Time: 15.3 seconds — ABNORMAL HIGH (ref 11.6–15.2)

## 2012-03-01 MED ORDER — DARBEPOETIN ALFA-POLYSORBATE 100 MCG/0.5ML IJ SOLN
INTRAMUSCULAR | Status: AC
  Administered 2012-03-01: 100 ug via INTRAVENOUS
  Filled 2012-03-01: qty 0.5

## 2012-03-01 MED ORDER — FENTANYL CITRATE 0.05 MG/ML IJ SOLN: 50.0000 ug | Freq: Once | INTRAMUSCULAR | Status: AC

## 2012-03-01 MED ORDER — MIDAZOLAM HCL 2 MG/2ML IJ SOLN: 1.0000 mg | INTRAMUSCULAR | Status: DC | PRN

## 2012-03-01 MED ORDER — DOXERCALCIFEROL 4 MCG/2ML IV SOLN
INTRAVENOUS | Status: AC
  Administered 2012-03-01: 4 ug via INTRAVENOUS
  Filled 2012-03-01: qty 2

## 2012-03-01 NOTE — Progress Notes (Signed)
[facility] [place]  Subjective  - POD #1  The patient is status post right below-knee dictation. Immediately, in the postoperative period he is found to be confused and somnolent. He did improve with the administration of Narcan. Throughout the night his mental status returned to baseline. He is currently on dialysis. He does not report significant pain in his right below-knee amputation site.   Physical Exam:  Dressing is dry.       Assessment/Plan:  POD #1  End-stage renal disease: The patient will continue with his normal dialysis schedule which is Tuesday Thursday Saturday. I will remove his dressing tomorrow and evaluate his wound.  [place]vada Seaborn IV, VAnner Crete 03/01/2012 10:41 AM --  Filed Vitals:   03/01/12 1000  BP: 125/67  Pulse: 112  Temp:   Resp: 15    Intake/Output Summary (Last 24 hours) at 03/01/12 1041 Last data filed at 02/29/12 1630  Gross per 24 hour  Intake   2095 ml  Output    925 ml  Net   1170 ml     Laboratory CBC    Component Value Date/Time   WBC 10.1 03/01/2012 0700   HGB 11.3* 03/01/2012 0700   HCT 34.7* 03/01/2012 0700   PLT 239 03/01/2012 0700    BMET    Component Value Date/Time   NA 136 03/01/2012 0700   K 4.3 03/01/2012 0700   CL 97 03/01/2012 0700   CO2 29 03/01/2012 0700   GLUCOSE 84 03/01/2012 0700   BUN 30* 03/01/2012 0700   CREATININE 5.84* 03/01/2012 0700   CALCIUM 8.2* 03/01/2012 0700   CALCIUM 8.8 10/27/2010 0734   GFRNONAA 8* 03/01/2012 0700   GFRAA 10* 03/01/2012 0700    COAG Lab Results  Component Value Date   INR 1.23 03/01/2012   INR 1.81* 02/29/2012   INR 2.6* 02/26/2012   No results found for this basename: PTT    Antibiotics Anti-infectives   Start     Dose/Rate Route Frequency Ordered Stop   02/29/12 2200  doxycycline (VIBRA-TABS) tablet 100 mg     100 mg Oral 2 times daily 02/29/12 1547     02/29/12 1600  vancomycin (VANCOCIN) IVPB 1000 mg/200 mL premix  Status:  Discontinued     1,000 mg 200 mL/hr over 60 Minutes Intravenous Every 12 hours 02/29/12 1547 02/29/12 1606   02/28/12 1351  vancomycin (VANCOCIN) 1,500 mg in sodium chloride 0.9 % 500 mL IVPB     1,500 mg 250 mL/hr over 120 Minutes Intravenous 60 min pre-op 02/28/12 1351 02/29/12 1011   02/28/12 1350  vancomycin (VANCOCIN) IVPB 1000 mg/200 mL premix  Status:  Discontinued     1,000 mg 200 mL/hr over 60 Minutes Intravenous 60 min pre-op 02/28/12 1350 02/28/12 1351       V. Charlena Cross, M.D. [facility] [place] Office: [phone] Pager:  [phone]

## 2012-03-01 NOTE — Progress Notes (Signed)
PT Cancellation Note  Patient Details Name: Alan [name] MRN: [id number] DOB: 1931-02-10   Cancelled Treatment:    Reason Eval/Treat Not Completed: Other (comment) (pt refused today.)  03/01/2012  [place] Bing, PT [phone] (pager)   [place] Marian, Eliseo Gum 03/01/2012, 12:22 PM

## 2012-03-01 NOTE — Progress Notes (Signed)
Patient at this time is off the unit and in diaylsis.

## 2012-03-01 NOTE — Procedures (Signed)
[facility]  On HD via R UA AVF BP--128/63 Goal-- 2 L Hgb--11.3   K+--4.3  Weight pre HD today  99.8 kg (EDW prior to BKA was 102.5 kg--will need tobe lower post BKA) PT/ INR 15.3 sec/1.23--coumadin restarted

## 2012-03-01 NOTE — Progress Notes (Signed)
Patient's HR has remained in the 120"s since dialysis and BP has been 70-80/50-40, have notified [name] and will haold lopressor at this time.

## 2012-03-02 LAB — BASIC METABOLIC PANEL
Chloride: 98 mEq/L (ref 96–112)
Creatinine, Ser: 4.77 mg/dL — ABNORMAL HIGH (ref 0.50–1.35)
GFR calc Af Amer: 12 mL/min — ABNORMAL LOW (ref >90)
Sodium: 137 mEq/L (ref 135–145)

## 2012-03-02 LAB — PROTIME-INR: INR: 1.27 (ref 0.00–1.49)

## 2012-03-02 LAB — GLUCOSE, CAPILLARY
Glucose-Capillary: 169 mg/dL — ABNORMAL HIGH (ref 70–99)
Glucose-Capillary: 136 mg/dL — ABNORMAL HIGH (ref 70–99)

## 2012-03-02 MED ORDER — ENOXAPARIN SODIUM 30 MG/0.3ML ~~LOC~~ SOLN
30.0000 mg | SUBCUTANEOUS | Status: DC
  Administered 2012-03-02: 30 mg via SUBCUTANEOUS
  Filled 2012-03-02 (×2): qty 0.3

## 2012-03-02 MED ORDER — METOPROLOL TARTRATE 12.5 MG HALF TABLET
12.5000 mg | ORAL_TABLET | Freq: Two times a day (BID) | ORAL | Status: DC
  Administered 2012-03-02 (×2): 12.5 mg via ORAL
  Filled 2012-03-02 (×4): qty 1

## 2012-03-02 MED ORDER — BISACODYL 10 MG RE SUPP
10.0000 mg | Freq: Every day | RECTAL | Status: DC | PRN
  Administered 2012-03-02: 10 mg via RECTAL
  Filled 2012-03-02: qty 1

## 2012-03-02 MED ORDER — HYDROCORTISONE ACE-PRAMOXINE 1-1 % RE FOAM
1.0000 | Freq: Two times a day (BID) | RECTAL | Status: DC | PRN
  Filled 2012-03-02: qty 10

## 2012-03-02 NOTE — Progress Notes (Signed)
Orthopedic Tech Progress Note Patient Details:  Alan [name] 12-Apr-1931 [id number] Brace order completed by [employer] vendor [place]. Patient ID: Alan [name], male   DOB: 22-Jun-1931, 79 y.o.   MRN: [id number]   Alan [name] 03/02/2012, 5:50 PM

## 2012-03-02 NOTE — Progress Notes (Addendum)
[facility] Progress Note  03/02/2012 9:32 AM POD 2  Subjective:  No complaints  Afebrile x 24 hrs HR 80's 120's 87-140's systolic 100% RA  Filed Vitals:   03/02/12 0726  BP: 91/54  Pulse:   Temp: 97.7 F (36.5 C)  Resp:     Physical Exam: Incisions:  C/d/i with staples in tact Extremities:  Good movement of RLE   CBC    Component Value Date/Time   WBC 12.3* 03/02/2012 0450   RBC 3.19* 03/02/2012 0450   HGB 9.2* 03/02/2012 0450   HCT 28.7* 03/02/2012 0450   PLT 299 03/02/2012 0450   MCV 90.0 03/02/2012 0450   MCH 28.8 03/02/2012 0450   MCHC 32.1 03/02/2012 0450   RDW 14.8 03/02/2012 0450   LYMPHSABS 1.4 01/22/2012 0640   MONOABS 1.2* 01/22/2012 0640   EOSABS 0.0 01/22/2012 0640   BASOSABS 0.0 01/22/2012 0640    BMET    Component Value Date/Time   NA 137 03/02/2012 0450   K 3.6 03/02/2012 0450   CL 98 03/02/2012 0450   CO2 32 03/02/2012 0450   GLUCOSE 149* 03/02/2012 0450   BUN 21 03/02/2012 0450   CREATININE 4.77* 03/02/2012 0450   CALCIUM 8.4 03/02/2012 0450   CALCIUM 8.8 10/27/2010 0734   GFRNONAA 11* 03/02/2012 0450   GFRAA 12* 03/02/2012 0450    INR    Component Value Date/Time   INR 1.27 03/02/2012 0450   INR 3.18 02/06/2011     Intake/Output Summary (Last 24 hours) at 03/02/12 0932 Last data filed at 03/02/12 0803  Gross per 24 hour  Intake    620 ml  Output   1530 ml  Net   -910 ml     Assessment/Plan:  79 y.o. male is s/p right below knee amputation  POD 2  -dressing changed this am. -Right BKA stump is viable -will consult [employer]otech for limb guard -systolic BP running low this am-will cut his dose of lopressor to 12.5 mg bid -continue coumadin - will start lovenox today as pt was high risk for bleeding from surgical site before today -HD per renal   Doreatha Massed, PA-C [facility] [phone] 03/02/2012 9:32 AM    I agree with the above. The patient was seen and examined. His right below knee amputation  stump is healing nicely. I removed his drain today.  Alan [name]

## 2012-03-02 NOTE — Progress Notes (Signed)
Orthopedic Tech Progress Note Patient Details:  RONDO SPITTLER [birth year] [id number] Trapeze bar      Cammer, Mickie Bail 03/02/2012, 2:01 PM

## 2012-03-02 NOTE — Progress Notes (Signed)
Patient is constipated this morning and has received a dulcolax supp. As well as a stool softner. So far the patient has had no results.

## 2012-03-02 NOTE — Plan of Care (Signed)
Problem: Phase II Progression Outcomes Goal: Pain controlled Outcome: Progressing Pt denies pain Goal: Vital signs stable Outcome: Progressing vss Goal: Surgical site without signs of infection Outcome: Progressing No signs of infection Goal: Dressings dry/intact Outcome: Progressing C/d/i

## 2012-03-02 NOTE — Progress Notes (Signed)
[facility]  Subjective:  Awake, had HD yesterday.  Say's he's a [employer] fan (nephew graduated from there).  No c/o HR 120's (hadn't been getting metoprolol because of low BP).  Now on 12.5 BID (37.5 BID PTA)   Objective: Vital signs in last 24 hours: Blood pressure 91/54, pulse 99, temperature 97.7 F (36.5 C), temperature source Oral, resp. rate 23, height 6' (1.829 m), weight 100.2 kg (220 lb 14.4 oz), SpO2 100.00%.    PHYSICAL EXAM General--As above, tachycardic Chest--clear Heart--no rub Abd--nontender Extr--B BKA, AVF patent RUA  Lab Results:   Recent Labs Lab 03/01/12 0535 03/01/12 0700 03/02/12 0450  NA 136 136 137  K 3.9 4.3 3.6  CL 96 97 98  CO2 30 29 32  BUN 29* 30* 21  CREATININE 5.86* 5.84* 4.77*  GLUCOSE 78 84 149*  CALCIUM 8.1* 8.2* 8.4  PHOS  --  3.9  --      Recent Labs  03/01/12 0700 03/02/12 0450  WBC 10.1 12.3*  HGB 11.3* 9.2*  HCT 34.7* 28.7*  PLT 239 299   Dialysis Orders: Center: [facility] on TTS .  EDW 102.5 kg.  HD Bath 2.0 K, 2.25 Ca Time 4hrs Heparin 3,400 units. Access R U A AVF BFR 400 DFR 800 Hectoral 4.o mcg mcg IV/HD( ipth 351 02/07/12) Epogen 6000 Units IV/HD Venofer 0 Ferritn 1108 02/07/12 19 % tfs    I have reviewed the patient's current medications. Scheduled: . allopurinol  150 mg Oral Daily  . aspirin EC  81 mg Oral Daily  . calcium carbonate  1 tablet Oral TID WC  . darbepoetin (ARANESP) injection - DIALYSIS  100 mcg Intravenous Q Sat-HD  . docusate sodium  100 mg Oral Daily  . doxercalciferol  4 mcg Intravenous Q T,Th,Sa-HD  . doxycycline  100 mg Oral BID  . enoxaparin (LOVENOX) injection  30 mg Subcutaneous Q24H  . glimepiride  2 mg Oral QAC breakfast  . insulin aspart  0-15 Units Subcutaneous TID WC  . metoprolol tartrate  12.5 mg Oral BID  . metoprolol tartrate  37.5 mg Oral BID  . multivitamin with minerals  1 tablet Oral Daily  . pantoprazole  40 mg Oral Daily  . simvastatin  40 mg  Oral QHS  . vitamin C  500 mg Oral Daily  . warfarin  2.5 mg Oral Q Fri-1800  . warfarin  5 mg Oral Custom  . Warfarin - Physician Dosing Inpatient   Does not apply q1800   Assessment/Plan: 1. Right BKA-- DR. Brabham.  Likely to need rehab.  2. AMS--Prob. Sec to Infected R Trans Met site / now amputated.  Better today 3. ESRD - TTS ( [facility]) K 3.6 today. Dialyzed Sat 22 Feb.   4. Hypertension/volume--BP 91/54, p 120 currently.  To get lower dose metoprolol.  Weight today 100.2 kg (EDW prior to BKA was 102.5 kg) 5. A. Fib--HR  120.  Metoprolol to be given at lower dose 6. Anemia -- hgb 9.2 today.   2 u prbcs given post op ,Aranesp .  Check Fe studies 7. Metabolic bone disease -Ca 8.4 today.  On hectorol 4 mcg each HD 8. DM type 2--AC with sliding scale     LOS: 2 days   Anjana Cheek F 03/02/2012,9:41 AM   .labalb

## 2012-03-02 NOTE — Progress Notes (Signed)
Orthopedic Tech Progress Note Patient Details:  CARLYN MULLENBACH December 10, 1931 [id number]  Patient ID: [name], male   DOB: [birth year], 79 y.o.   MRN: [id number]   [name] 03/02/2012, 1:22 PM  CALLED [employer] FOR RIGHT LIMB GUARD.

## 2012-03-03 ENCOUNTER — Encounter (HOSPITAL_COMMUNITY): Payer: Self-pay | Admitting: Surgery

## 2012-03-03 ENCOUNTER — Ambulatory Visit: Payer: MEDICARE | Admitting: Surgery

## 2012-03-03 LAB — GLUCOSE, CAPILLARY
Glucose-Capillary: 190 mg/dL — ABNORMAL HIGH (ref 70–99)
Glucose-Capillary: 146 mg/dL — ABNORMAL HIGH (ref 70–99)
Glucose-Capillary: 135 mg/dL — ABNORMAL HIGH (ref 70–99)

## 2012-03-03 LAB — CBC
HCT: 28.7 % — ABNORMAL LOW (ref 39.0–52.0)
Platelets: 299 10*3/uL (ref 150–400)
RBC: 3.19 MIL/uL — ABNORMAL LOW (ref 4.22–5.81)
RDW: 14.8 % (ref 11.5–15.5)
WBC: 12.3 10*3/uL — ABNORMAL HIGH (ref 4.0–10.5)

## 2012-03-03 LAB — PROTIME-INR
INR: 1.41 (ref 0.00–1.49)
Prothrombin Time: 16.9 seconds — ABNORMAL HIGH (ref 11.6–15.2)

## 2012-03-03 MED ORDER — METOPROLOL TARTRATE 25 MG PO TABS
25.0000 mg | ORAL_TABLET | Freq: Two times a day (BID) | ORAL | Status: DC
  Administered 2012-03-03 (×2): 25 mg via ORAL
  Filled 2012-03-03 (×6): qty 1

## 2012-03-03 MED ORDER — ENOXAPARIN SODIUM 100 MG/ML ~~LOC~~ SOLN: 1.0000 mg/kg | SUBCUTANEOUS | Status: DC

## 2012-03-03 MED ORDER — METOPROLOL TARTRATE 1 MG/ML IV SOLN
2.5000 mg | Freq: Four times a day (QID) | INTRAVENOUS | Status: DC | PRN
  Administered 2012-03-03: 2.5 mg via INTRAVENOUS
  Filled 2012-03-03: qty 5

## 2012-03-03 MED ORDER — OXYCODONE HCL 5 MG PO TABS: 5.0000 mg | ORAL_TABLET | Freq: Four times a day (QID) | ORAL | Status: DC | PRN

## 2012-03-03 MED ORDER — NEPRO/CARBSTEADY PO LIQD: 237.0000 mL | Freq: Three times a day (TID) | ORAL | Status: DC

## 2012-03-03 MED ORDER — ENOXAPARIN SODIUM 100 MG/ML ~~LOC~~ SOLN
1.0000 mg/kg | SUBCUTANEOUS | Status: DC
  Administered 2012-03-03 – 2012-03-04 (×2): 100 mg via SUBCUTANEOUS
  Filled 2012-03-03 (×3): qty 1

## 2012-03-03 MED ORDER — NEPRO/CARBSTEADY PO LIQD
237.0000 mL | Freq: Two times a day (BID) | ORAL | Status: DC
  Administered 2012-03-03 – 2012-03-04 (×3): 237 mL via ORAL
  Filled 2012-03-03 (×7): qty 237

## 2012-03-03 NOTE — Evaluation (Signed)
Occupational Therapy Evaluation Patient Details Name: Alan [name] MRN: [id number] DOB: [birth year] Today's Date: 03/03/2012 Time: 4098-1191 OT Time Calculation (min): 30 min  OT Assessment / Plan / Recommendation Clinical Impression  79 yo male s/p Rt BKA with recent d/c from CIR. Recommend SNF for d/c planning. Ot to follow acutely however patient with poor participation at this time.     OT Assessment  Patient needs continued OT Services    Follow Up Recommendations  SNF    Barriers to Discharge Decreased caregiver support    Equipment Recommendations  3 in 1 bedside comode;Wheelchair (measurements OT);Wheelchair cushion (measurements OT);Other (comment) (lift equipment)    Recommendations for Other Services    Frequency  Min 2X/week    Precautions / Restrictions Precautions Precautions: Fall Precaution Comments: Bil BKA  Required Braces or Orthoses: Other Brace/Splint Other Brace/Splint: Rt limb guard Restrictions Weight Bearing Restrictions: Yes RLE Weight Bearing: Non weight bearing   Pertinent Vitals/Pain HR 109    ADL  ADL Comments: Pt stating that therapy should return tomorrow and that he just had surgery yesterday. Pt reoriented to place and time. Pt is in fact POD #3 and refusing participation. After extended time and extended motivation pt was agreeable to allowing therapist to don limb guard properly. Pt with limb guard positioned below the knee. Pt with no redness noted at this time. Pt positioned in limb guard with gauze placed over knee cap to prevent skin break down and stockette remained . Pt with ace wrap in place and wrapped tightly. Pt limbing BIL LE off bed surface without (A). Pt abducting Lt LE to EOB . Pt however declining EOB sitting or log rolling. Pt states "DONT do that tomorrow" when any attempts to motivate. Pt has HD tues thursday Saturday. OT will reattempt Wednesday. Pt is high risk for skin break down and PNA due to lack of mobility. Pt  educated and RN made aware of patients need for pressure relief.     OT Diagnosis: Acute pain;Cognitive deficits  OT Problem List: Decreased strength;Decreased activity tolerance;Impaired balance (sitting and/or standing);Decreased safety awareness;Decreased knowledge of use of DME or AE;Decreased knowledge of precautions;Pain;Decreased cognition OT Treatment Interventions: Self-care/ADL training;Therapeutic exercise;DME and/or AE instruction;Therapeutic activities;Cognitive remediation/compensation;Patient/family education;Balance training   OT Goals Acute Rehab OT Goals OT Goal Formulation: Patient unable to participate in goal setting Time For Goal Achievement: 03/17/12 Potential to Achieve Goals: Fair ADL Goals Pt Will Perform Grooming: with min assist;Sitting, chair;Supported;with cueing (comment type and amount) (visual auditory) ADL Goal: Grooming - Progress: Goal set today Miscellaneous OT Goals Miscellaneous OT Goal #1: Pt will sit EOB Min (A) for 5 mintues as precursor to adls OT Goal: Miscellaneous Goal #1 - Progress: Goal set today Miscellaneous OT Goal #2: pt will complete log rolling Rt and Lt at supervision level as precursor to adls OT Goal: Miscellaneous Goal #2 - Progress: Goal set today  Visit Information  Last OT Received On: 03/03/12 Assistance Needed: +2 PT/OT Co-Evaluation/Treatment: Yes    Subjective Data  Subjective: "come back tomorrow"  "early" Patient Stated Goal: to walk outside tomorrow with therapy   Prior Functioning     Home Living Lives With: Spouse Available Help at Discharge: Family;Available 24 hours/day Type of Home: House Home Access: Stairs to enter;Ramped entrance Home Layout: One level Bathroom Shower/Tub: Walk-in shower;Tub/shower unit Bathroom Toilet: Standard Home Adaptive Equipment: Bedside commode/3-in-1;Walker - rolling;Straight cane;Wheelchair - manual Additional Comments: Recent d/c from CIR with poor participation and  maintaining WBing precautions. NOw with  RT BKA Prior Function Level of Independence: Needs assistance Able to Take Stairs?: No Driving: No Vocation: Retired Musician: No difficulties Dominant Hand: Right         Vision/Perception Vision - History Baseline Vision: No visual deficits Patient Visual Report: No change from baseline   Cognition  Cognition Overall Cognitive Status: Impaired Area of Impairment: Memory;Following commands;Problem solving;Awareness of deficits Arousal/Alertness: Awake/alert Orientation Level: Disoriented to;Person;Time;Place;Situation Behavior During Session: Flat affect Memory: Decreased recall of precautions Following Commands: Follows one step commands inconsistently;Follows one step commands with increased time Awareness of Deficits: pt states "come back tomorrow and we can go for a walk outside" then declines EOB sitting because "the doctor told me to stay off that leg and he had to cut it off now I dont have a leg so no the doctor told me to stay off it I just had surgery yesterday" Problem Solving: Pt reports that he wants to get better however unable to use problem solving with therapist education on the need to mobilize as progressing toward recovery. Pt with poor insight to deconditioning and need to participate Cognition - Other Comments: Pt insisting that therapist leave and go work with football players on the first floor. Pt educated that football players are not located on the first floor. Pt insisted that they are in fact located there. Pt asking very inappropriate questioning of therapist that was sexual in nature. pt educated that questioning was not appropriate and pt with no recognition of error. Pt states "exactly" with max cueing to how statement was not related to task at hand.     Extremity/Trunk Assessment Right Upper Extremity Assessment RUE ROM/Strength/Tone: Within functional levels;Due to impaired cognition Left  Upper Extremity Assessment LUE ROM/Strength/Tone: Within functional levels;Due to impaired cognition     Mobility Bed Mobility Bed Mobility: Not assessed Transfers Transfers: Not assessed     Exercise Amputee Exercises Hip Flexion/Marching: AROM;Left;Right;Supine (Repeating x 3 times then terminating)   Balance     End of Session OT - End of Session Activity Tolerance: Other (comment) (poor awarenss cognitive deficits affecting participation) Patient left: in bed;with call bell/phone within reach;with bed alarm set;Other (comment) (Rn made aware of need for pressure relief) Nurse Communication: Need for lift equipment;Weight bearing status  GO   Pt educated on need for skin checks, bed mobility to decr skin break down, Need for Rt LE positioning, Need to mobilize during session  Lucile Shutters 03/03/2012, 11:20 AM Pager: [phone]

## 2012-03-03 NOTE — Evaluation (Signed)
Physical Therapy Evaluation Patient Details Name: Alan [name] MRN: [id number] DOB: [birth year] Today's Date: 03/03/2012 Time: 4098-1191 PT Time Calculation (min): 31 min  PT Assessment / Plan / Recommendation Clinical Impression  79 y.o. male admitted to [facility] for R BKA (recent right forefoot amputation and prior left BKA) who was recently discahrged home from inpatient rehab on 02/06/12.  He presents today with limited participation, not wanting to move OOB or even EOB.  PT/OT spent extensive time educating pt on why being in the bed could be detrimental to his recovery after surgery.  Even with max encouragement the pt refused to participate.  Mental status (baseline) with poor resoning ability does not help due to the fact that we cannot reason with him about why we are here and why he needs to move to get better.  We were able to complete a basic bed level strength assessment and repositioned his right limb guard to fit properly.  PT/OT to check back later this week to see if we can get him to mobilize more.  He is appropriate for SNF placement at discharge.      PT Assessment  Patient needs continued PT services    Follow Up Recommendations  SNF    Does the patient have the potential to tolerate intense rehabilitation    no  Barriers to Discharge None      Equipment Recommendations  None recommended by PT    Recommendations for Other Services   none  Frequency Min 3X/week    Precautions / Restrictions Precautions Precautions: Fall Precaution Comments: Bil BKA  Required Braces or Orthoses: Other Brace/Splint Other Brace/Splint: Rt limb guard Restrictions Weight Bearing Restrictions: Yes RLE Weight Bearing: Non weight bearing   Pertinent Vitals/Pain Reports right leg pain, did not request pain meds     Mobility  Bed Mobility Bed Mobility: Not assessed (due to patient refusal)    Exercises Amputee Exercises Hip Flexion/Marching: AROM;Left;Right;Supine (Repeating x 3  times then terminating)   PT Diagnosis: Generalized weakness;Acute pain;Altered mental status  PT Problem List: Decreased strength;Decreased range of motion;Decreased balance;Decreased activity tolerance;Decreased mobility;Decreased cognition;Decreased knowledge of use of DME;Decreased safety awareness;Decreased knowledge of precautions;Pain PT Treatment Interventions: DME instruction;Functional mobility training;Therapeutic activities;Therapeutic exercise;Balance training;Neuromuscular re-education;Cognitive remediation;Patient/family education;Wheelchair mobility training;Modalities   PT Goals Acute Rehab PT Goals PT Goal Formulation: With patient Time For Goal Achievement: 03/17/12 Potential to Achieve Goals: Good Pt will go Supine/Side to Sit: with mod assist;with rail PT Goal: Supine/Side to Sit - Progress: Goal set today Pt will Sit at Edge of Bed: with min assist;3-5 min;with bilateral upper extremity support PT Goal: Sit at Edge Of Bed - Progress: Goal set today Pt will go Sit to Supine/Side: with min assist;with HOB 0 degrees;with rail PT Goal: Sit to Supine/Side - Progress: Goal set today Pt will go Sit to Stand: Other (comment) (goal from previous admission) PT Goal: Sit to Stand - Progress: Discontinued (comment) (goal from previous admission) Pt will go Stand to Sit: Other (comment) (goal from previous admission) PT Goal: Stand to Sit - Progress: Discontinued (comment) (goal from previous admission) Pt will Transfer Bed to Chair/Chair to Bed: with mod assist PT Transfer Goal: Bed to Chair/Chair to Bed - Progress: Goal set today Pt will Stand: Other (comment) (goal from previous admission) PT Goal: Stand - Progress: Discontinued (comment) (goal from previous admission) Pt will Perform Home Exercise Program: Other (comment) (goal from previous admission) PT Goal: Perform Home Exercise Program - Progress: Discontinued (comment) (goal from previous  admission) Pt will Propel  Wheelchair: 51 - 150 feet;with supervision PT Goal: Propel Wheelchair - Progress: Goal set today  Visit Information  Last PT Received On: 03/03/12 Assistance Needed: +2 PT/OT Co-Evaluation/Treatment: Yes    Subjective Data  Subjective: "Come back tomorrow.  I just had surgery yesterday".  Pt is POD #3 R BKA.   Patient Stated Goal: to walk   Prior Functioning  Home Living Lives With: Spouse Available Help at Discharge: Family;Available 24 hours/day Type of Home: House Home Access: Stairs to enter;Ramped entrance Home Layout: One level Bathroom Shower/Tub: Walk-in shower;Tub/shower unit Bathroom Toilet: Standard Home Adaptive Equipment: Bedside commode/3-in-1;Walker - rolling;Straight cane;Wheelchair - manual Additional Comments: Recent d/c from CIR with poor participation and maintaining WBing precautions. NOw with RT BKA Prior Function Level of Independence: Needs assistance Able to Take Stairs?: No Driving: No Vocation: Retired Musician: No difficulties Dominant Hand: Right    Cognition  Cognition Overall Cognitive Status: Impaired Area of Impairment: Memory;Following commands;Problem solving;Awareness of deficits Arousal/Alertness: Awake/alert Orientation Level: Disoriented to;Person;Time;Place;Situation Behavior During Session: Flat affect Memory: Decreased recall of precautions Following Commands: Follows one step commands inconsistently;Follows one step commands with increased time Awareness of Deficits: pt states "come back tomorrow and we can go for a walk outside" then declines EOB sitting because "the doctor told me to stay off that leg and he had to cut it off now I dont have a leg so no the doctor told me to stay off it I just had surgery yesterday" Problem Solving: Pt reports that he wants to get better however unable to use problem solving with therapist education on the need to mobilize as progressing toward recovery. Pt with poor insight to  deconditioning and need to participate Cognition - Other Comments: This therapist is familiar with this pt from working with him on CIR.  His cognitive baseline is poor and his reasoning skills are not in tact (it is difficult to educate and reason with him due to poor reasoning skills).  Pt insisting that therapist leave and go work with football players on the first floor. Pt educated that football players are not located on the first floor. Pt insisted that they are in fact located there. Pt asking very inappropriate questioning of therapist that was sexual in nature. pt educated that questioning was not appropriate and pt with no recognition of error. Pt states "exactly" with max cueing to how statement was not related to task at hand.     Extremity/Trunk Assessment Right Upper Extremity Assessment RUE ROM/Strength/Tone: Within functional levels;Due to impaired cognition Left Upper Extremity Assessment LUE ROM/Strength/Tone: Within functional levels;Due to impaired cognition Right Lower Extremity Assessment RLE ROM/Strength/Tone: Deficits;Unable to fully assess RLE ROM/Strength/Tone Deficits: difficult to assess due to poor participation level and pain, but pt was able to lift right leg against gravity (demonstrating at least 3/5 right hip flexion strength) and was able to activate his quad to straighten his leg while limb guard was re-applied correctly.   Left Lower Extremity Assessment LLE ROM/Strength/Tone: Deficits;Unable to fully assess LLE ROM/Strength/Tone Deficits: unable to fully assess due to decreased participation, but functionally he was able to lift left leg against gravity (3/5 hip flexion) and bend and straighten left knee (at least 2+-3-/5 functionally.        End of Session PT - End of Session Activity Tolerance: Patient limited by pain Patient left: in bed;with bed alarm set;with call bell/phone within reach Nurse Communication: Other (comment) (refusal for mobility)  Rollene Rotunda Nicey Krah, PT, DPT [phone]   03/03/2012, 11:48 AM

## 2012-03-03 NOTE — Care Management Note (Signed)
    Page 1 of 2   03/05/2012     11:00:53 AM   CARE MANAGEMENT NOTE 03/05/2012  Patient:  Alan [name], Alan [name]   Account Number:  [id number]  Date Initiated:  03/03/2012  Documentation initiated by:  Donn Pierini  Subjective/Objective Assessment:   Pt admitted with non-healing surgical wound, PVD-  now s/p right BKA on 2/21     Action/Plan:   PTA pt [facility]- PT/OT evals   Anticipated DC Date:  03/05/2012   Anticipated DC Plan:  HOME W HOME HEALTH SERVICES  In-house referral  Clinical Social Worker      DC Planning Services  CM consult      [facility] Choice  HOME HEALTH   Choice offered to / List presented to:  C-3 Spouse        HH arranged  HH-1 RN  HH-2 PT  HH-3 OT      [facility] agency  [facility]   Status of service:  Completed, signed off Medicare Important Message given?   (If response is "NO", the following Medicare IM given date fields will be blank) Date Medicare IM given:   Date Additional Medicare IM given:    Discharge Disposition:  HOME W HOME HEALTH SERVICES  Per UR Regulation:  Reviewed for med. necessity/level of care/duration of stay  If discussed at Long Length of Stay Meetings, dates discussed:    Comments:  Contact- SonTadeusz [name].- [phone]                 wife- Alan [name]- [phone]  03/05/12 Alan Costlow,RN,BSN [phone] PT FOR DC HOME TODAY WITH FAMILY.  PT NEEDS HH FOLLOW UP; REFERRAL TO [facility], PER PT CHOICE.  START OF CARE 24-48H POST DC DATE.  03/03/12- 1030- Alan Webster RN,BSN [phone] Spoke with pt and his Son- Alan [name]. at bedside- per conversation pt's son states that the family is now wanted to take the pt back home with [facility] services. They had initially thought that they wanted a new facility placement and CSW was working on a new bed search. Per the son- he and pt's wife have spoken and it is now their desire to take the pt back home at discharge. Pt had been at home prior to going to  [facility] and per the son they have all the needed equipment including w/c, BSC, transfer board. They also where and still can provide transportation to outpt HD Tues/Thur/Sat. Per the son pt has had HH services in the past with [facility] and this would be the agency of choice for services. Family is able to provide 24/7 care for pt per son. Call made to pt's wife at home to confirm with her the d/c plans- per TC pt's wife states that it is her desire to bring the pt home at this time and not go to another facility. She also confirmed agency of choice is [facility] for [facility] services. CSW notified of change to home with [facility]-  MD could you please write for HH-RN/PT/OT/CSW for home. Family will provide transportation unless pt discharges after Thur. 03/06/12 in which the family would need ambulance transportation home due to son going out of town.

## 2012-03-03 NOTE — Progress Notes (Signed)
Physical medicine and rehabilitation consult requested. This patient had reached a supervision level after recent discharge from rehabilitation prior to his latest surgery. Patient did not want to be in CIR the first time with [place] and needed a lot of prompting to purchase a patent. It is still recommended at this time home health therapies versus skilled nursing facility.

## 2012-03-03 NOTE — Discharge Summary (Signed)
[facility] Discharge Summary  Alan [name] February 16, 1931 79 y.o. male  [id number]  Admission Date: 02/29/2012  Discharge Date: 03/05/12  Physician: Nada Libman, MD  Admission Diagnosis: Nonhealing surgical wound of right metatarsal Peripheral Vascular Disease   HPI:   This is a 79 y.o. male patient Dr. Myra [name] who is status post a right transmetatarsal amputation February 15 2012. The patient has been in a facility for rehabilitation. The patient's wife called the office asking for the patient be seen due to a nonhealing wound. The patient does not report great amounts of pain however there is drainage and open surgical bed.  Hospital Course:  The patient was admitted to the hospital and taken to the operating room on 02/29/2012 and underwent  Right BKA.  He did have viable muscle with adequate bleeding.  The pt tolerated the procedure well and was transported to the PACU in good condition. By POD 1, his pain was well controlled and he was doing well.    POD 2, he did have some hypotension with tachycardia and at that time, his lopressor was halved.  By POD 3, his BP is better and lopressor is resumed at original dose.   His right BKA stump continues to be viable.  He was originally to go to a SNF, but wife and son want to take pt home with [facility] PT[facility].  She did have a session with PT to learn transfers for pt safely.  He is discharged home on POD 5 with [facility] PT[facility].  There will be a [facility] RN that will drawn his INR on Thursday, 03/06/12 and call it to Dr. [place].  His INR is 2.19 at discharge and lovenox is discontinued.  The remainder of the hospital course consisted of increasing mobilization and increasing intake of solids without difficulty.  CBC    Component Value Date/Time   WBC 11.9* 03/04/2012 0831   RBC 2.98* 03/04/2012 0831   HGB 8.6* 03/04/2012 0831   HCT 26.4* 03/04/2012 0831   PLT 314 03/04/2012 0831   MCV 88.6 03/04/2012 0831   MCH 28.9 03/04/2012  0831   MCHC 32.6 03/04/2012 0831   RDW 15.0 03/04/2012 0831   LYMPHSABS 1.4 01/22/2012 0640   MONOABS 1.2* 01/22/2012 0640   EOSABS 0.0 01/22/2012 0640   BASOSABS 0.0 01/22/2012 0640    BMET    Component Value Date/Time   NA 135 03/04/2012 0831   K 3.7 03/04/2012 0831   CL 95* 03/04/2012 0831   CO2 28 03/04/2012 0831   GLUCOSE 81 03/04/2012 0831   BUN 43* 03/04/2012 0831   CREATININE 8.71* 03/04/2012 0831   CALCIUM 8.6 03/04/2012 0831   CALCIUM 8.8 10/27/2010 0734   GFRNONAA 5* 03/04/2012 0831   GFRAA 6* 03/04/2012 0831      Discharge Instructions:   The patient is discharged to home with extensive instructions on wound care and progressive ambulation.  They are instructed not to drive or perform any heavy lifting until returning to see the physician in his office.  Discharge Orders   Future Appointments Provider Department Dept Phone   03/31/2012 10:30 AM Nada Libman, MD [facility] [phone]   Future Orders Complete By Expires     Call MD for:  redness, tenderness, or signs of infection (pain, swelling, bleeding, redness, odor or green/yellow discharge around incision site)  As directed     Call MD for:  severe or increased pain, loss or decreased feeling  in  affected limb(s)  As directed     Call MD for:  temperature >100.5  As directed     Discharge instructions  As directed     Comments:      Discontinue lovenox when INR > 2.0    Resume previous diet  As directed     may wash over wound with mild soap and water  As directed        Discharge Diagnosis:  Nonhealing surgical wound of right metatarsal Peripheral Vascular Disease  Secondary Diagnosis: Patient Active Problem List  Diagnosis  . Encounter for long-term (current) use of anticoagulants  . Aortic stenosis  . CAD (coronary artery disease)  . End stage renal disease on dialysis  . Sepsis  . Toxic metabolic encephalopathy  . Diabetes mellitus  . Chronic anticoagulation  .  Hypotension  . Atherosclerosis of native arteries of the extremities with ulceration(440.23)  . Aftercare following surgery of the circulatory system, NEC  . SIRS (systemic inflammatory response syndrome)  . Wound infection  . Unilateral complete BKA  . Atrial fibrillation  . Pre-operative cardiovascular examination  . Hx of BKA  . Post-traumatic wound infection  . Atherosclerosis of native arteries of the extremities with gangrene   Past Medical History  Diagnosis Date  . Diabetes mellitus   . Gout   . Hypertension   . Hyperlipidemia   . Arthritis   . Prostate cancer     s/p seed implant  . Cataract     BILATERAL-BEEN REMOVED  . Neuromuscular disorder     DABETIC NEUROPATHY-LOWER EXTREMITY  . Atrial fibrillation     on coumadin  . Aortic stenosis     moderate to severe, not felt to be a surgical candidate  . Anemia     secondary to end stage renal disease  . Coronary artery disease     remote CABG in 1996; last stress test in 2005; last cath 2005  . Diabetic neuropathy   . Edema   . PAD (peripheral artery disease)   . Diabetic retinopathy(362.0)   . Chronic anticoagulation     on coumadin  . Shortness of breath   . Chronic kidney disease     Started dialysis October 2012  . Nephrolithiasis     prior history of percutaneous nephrostomy  . Myocardial infarction     1999 ish       Medication List    TAKE these medications       acetaminophen 500 MG tablet  Commonly known as:  TYLENOL  Take 1,000 mg by mouth every 8 (eight) hours as needed for pain.     allopurinol 300 MG tablet  Commonly known as:  ZYLOPRIM  Take 0.5 tablets (150 mg total) by mouth daily.     aspirin EC 81 MG tablet  Take 1 tablet (81 mg total) by mouth daily.     calcium carbonate 500 MG chewable tablet  Commonly known as:  TUMS - dosed in mg elemental calcium  Chew 1 tablet by mouth 3 (three) times daily with meals. Phosphorous binder     doxycycline 100 MG tablet  Commonly known  as:  VIBRA-TABS  Take 100 mg by mouth 2 (two) times daily. For 10 days starting on 02/24/12     enoxaparin 100 MG/ML injection  Commonly known as:  LOVENOX  Inject 1 mL (100 mg total) into the skin daily.     estazolam 1 MG tablet  Commonly known as:  PROSOM  Take 1  mg by mouth at bedtime as needed (for sleep).     glimepiride 2 MG tablet  Commonly known as:  AMARYL  Take 2 mg by mouth daily before breakfast.     ibuprofen 200 MG tablet  Commonly known as:  ADVIL,MOTRIN  Take 200 mg by mouth every 6 (six) hours as needed. For pain     metoprolol tartrate 25 MG tablet  Commonly known as:  LOPRESSOR  Take 37.5 mg by mouth 2 (two) times daily. Takes 1.5 tablets twice daily     multivitamin with minerals Tabs  Take 1 tablet by mouth daily.     oxyCODONE 5 MG immediate release tablet  Commonly known as:  ROXICODONE  Take 1 tablet (5 mg total) by mouth every 6 (six) hours as needed for pain.     PREPARATION H 0.25-3-14-71.9 % rectal ointment  Generic drug:  phenylephrine-shark liver oil-mineral oil-petrolatum  Place 1 application rectally 2 (two) times daily as needed for hemorrhoids.     simvastatin 40 MG tablet  Commonly known as:  ZOCOR  Take 1 tablet (40 mg total) by mouth at bedtime.     vitamin C 500 MG tablet  Commonly known as:  ASCORBIC ACID  Take 500 mg by mouth daily.     warfarin 5 MG tablet  Commonly known as:  COUMADIN  Take 5 mg by mouth See admin instructions. Patient takes 1 tablet every day, except Friday-takes 1/2 tablet.       Oxycodone #30 NR given  *Will need to monitor INR as pt is on coumadin*  Disposition: SNF  Patient's condition: is Good  Follow up: 1. Dr. Myra [name] in 4 weeks 2. Will have [facility] RN check INR Thursday 03/06/12 and call to Dr. Elvis Coil office  Doreatha Massed, PA-C [facility] [phone] 03/03/2012  7:48 AM

## 2012-03-03 NOTE — Clinical Social Work Psychosocial (Signed)
     Clinical Social Work Department BRIEF PSYCHOSOCIAL ASSESSMENT 03/03/2012  Patient:  Alan [name], Alan [name]     Account Number:  [id number]     Admit date:  02/29/2012  Clinical Social Worker:  Lourdes Sledge  Date/Time:  03/03/2012 09:42 AM  Referred by:  Physician  Date Referred:  03/03/2012 Referred for  SNF Placement   Other Referral:   Interview type:  Patient Other interview type:   CSW also completed assessment with pt spouse Alan [name] [phone]    PSYCHOSOCIAL DATA Living Status:  FACILITY Admitted from facility:  [facility] [place] Level of care:  Skilled Nursing Facility Primary support name:  Alan [name] [phone] Primary support relationship to patient:  SPOUSE Degree of support available:   Pt spouse appears actively involved in pt care.    CURRENT CONCERNS Current Concerns  Post-Acute Placement   Other Concerns:    SOCIAL WORK ASSESSMENT / PLAN CSW informed that pt will need SNF placement ot [facility] at discharge.    CSW visited pt room and  introduced herself and role. CSW informed pt of MD recommendations for SNF placement. Pt stated he was ready to go home and for CSW to contact pt spouse to discuss dc plans. Pt provided CSW with pt spouse contact number and informed her to call later this evening after she gets off of work.    CSW contacted pt spouse immediately after leaving pt room. Pt spouse answered and stated she is home all day as she does not work. Spouse confirmed that pt was admitted from [facility] where pt had 2 falls. Spouse requested a new SNF search preferrably close to pt dialysis center on [address] t/tr/sat.    CSW received consent for a new SNF search, CSW to follow up with bed offers.   Assessment/plan status:  Psychosocial Support/Ongoing Assessment of Needs Other assessment/ plan:   Information/referral to community resources:   CSW provided pt and wife a SNF list for [facility] as well as CSW contact number.    PATIENTS/FAMILYS RESPONSE TO PLAN OF CARE: Pt laying in bed, alert however appears minimally confused at times. Pt did not disclose he was from a facility and that his wife is actually home all day.    Full assessment completed with pt wife who confirms pt is from a facility however requests a new facility at dc.

## 2012-03-03 NOTE — Progress Notes (Signed)
[facility]  Subjective: Sitting up in bed, no complaints, appetite good. BP 90's 100's, afib with HR 98  Objective: Vital signs in last 24 hours: Blood pressure 98/45, pulse 100, temperature 97.9 F (36.6 C), temperature source Oral, resp. rate 18, height 6' (1.829 m), weight 100.8 kg (222 lb 3.6 oz), SpO2 97.00%.    PHYSICAL EXAM Gen- alert, no distress, pleasant Chest--clear Heart--no rub Abd--nontender Extr--B BKA, AVF patent RUA, no LE or UE edema  Dialysis Orders: Center: [facility] on TTS .  EDW 102.5 kg  Bath 2K/2.25Ca   4hrs Heparin 3400u  RUA AVF 400/800  Hectorol 4ug/hd  (pth 351 02/07/12) Epogen 6000u  Venofer 0 Ferritn 1108 02/07/12 19 % Tsat  Lab Results:  Recent Labs Lab 03/01/12 0535 03/01/12 0700 03/02/12 0450  NA 136 136 137  K 3.9 4.3 3.6  CL 96 97 98  CO2 30 29 32  BUN 29* 30* 21  CREATININE 5.86* 5.84* 4.77*  GLUCOSE 78 84 149*  CALCIUM 8.1* 8.2* 8.4  PHOS  --  3.9  --      Recent Labs  03/01/12 0700 03/02/12 0450  WBC 10.1 12.3*  HGB 11.3* 9.2*  HCT 34.7* 28.7*  PLT 239 299     I have reviewed the patient's current medications. Scheduled: . allopurinol  150 mg Oral Daily  . aspirin EC  81 mg Oral Daily  . calcium carbonate  1 tablet Oral TID WC  . darbepoetin (ARANESP) injection - DIALYSIS  100 mcg Intravenous Q Sat-HD  . docusate sodium  100 mg Oral Daily  . doxercalciferol  4 mcg Intravenous Q T,Th,Sa-HD  . doxycycline  100 mg Oral BID  . enoxaparin (LOVENOX) injection  1 mg/kg Subcutaneous Q24H  . glimepiride  2 mg Oral QAC breakfast  . insulin aspart  0-15 Units Subcutaneous TID WC  . metoprolol tartrate  25 mg Oral BID  . multivitamin with minerals  1 tablet Oral Daily  . pantoprazole  40 mg Oral Daily  . simvastatin  40 mg Oral QHS  . vitamin C  500 mg Oral Daily  . warfarin  2.5 mg Oral Q Fri-1800  . warfarin  5 mg Oral Custom  . Warfarin - Physician Dosing Inpatient   Does not apply q1800    Assessment/Plan: 1. Right BKA- d/c home vs SNF, per primary 2. AMS- due to infection, improved 3. ESRD, cont hd TTS 4. HTN/volume- below EDW 1-2kg after BKA, should be lower. BP soft. UF 2.5kg as tol next HD 5. Afib- on lopressor and sq lovenox full-dose  6. Anemia- 2 u prbcs given post op, cont darbe .  Check Fe studies 7. Metabolic bone disease -Ca 8.4 today.  On hectorol 4 mcg each HD 8. DM type 2--AC with sliding scale     LOS: 3 days   Elanora Quin D 03/03/2012,1:04 PM

## 2012-03-03 NOTE — Progress Notes (Addendum)
INITIAL NUTRITION ASSESSMENT  DOCUMENTATION CODES Per approved criteria  -Obesity Unspecified   INTERVENTION:  Nepro Carb Steady 2 times daily (425 kcals, 19.1 gm protein per 8 fl oz bottle) RD to follow for nutrition care plan  NUTRITION DIAGNOSIS: Increased nutrient needs related to wound healing as evidenced by estimated nutrition needs  Goal: Oral intake with meals to meet >/= 90% of estimated nutrition needs  Monitor:  PO intake, weight, labs, I/O's  Reason for Assessment: Malnutrition Screening Tool Report  79 y.o. male  Admitting Dx: Gangrenous toe right foot  ASSESSMENT: Patient with hx of ESRD on HD admitted after [facility]; found to have a great deal of swelling and the 2nd right toe was gangrenous.  Patient s/p procedure 2/21: BELOW KNEE AMPUTATION (RIGHT)  Patient reports his appetite is good; PO intake variable per flowsheet records at 25-75% per flowsheet records; denies recent weight loss; would benefit from addition of nutrition supplement for healing ---> RD to order.  Height: Ht Readings from Last 1 Encounters:  02/29/12 6' (1.829 m)    Weight: Wt Readings from Last 1 Encounters:  03/03/12 222 lb 3.6 oz (100.8 kg)    Ideal Body Weight: 157 lb ---> adjusted for bilateral BKA's  % Ideal Body Weight: 141%  Wt Readings from Last 10 Encounters:  03/03/12 222 lb 3.6 oz (100.8 kg)  03/03/12 222 lb 3.6 oz (100.8 kg)  02/25/12 227 lb (102.967 kg)  02/20/12 227 lb 4.7 oz (103.1 kg)  02/20/12 227 lb 4.7 oz (103.1 kg)  02/06/12 226 lb 6.6 oz (102.7 kg)  01/27/12 228 lb 9.6 oz (103.692 kg)  01/27/12 228 lb 9.6 oz (103.692 kg)  12/12/11 236 lb (107.049 kg)  12/12/11 236 lb (107.049 kg)    Usual Body Weight: 228 lb  % Usual Body Weight: 97%  BMI:  33.9 kg/m2 ---> adjusted for bilateral BKA's  Estimated Nutritional Needs: Kcal: 2000-2200 Protein: 100-110 gm Fluid: 2.0 L fluid restriction  Skin: R  foot wound  Diet Order: Carb Control  EDUCATION NEEDS: -No education needs identified at this time   Intake/Output Summary (Last 24 hours) at 03/03/12 1455 Last data filed at 03/03/12 1100  Gross per 24 hour  Intake    660 ml  Output      0 ml  Net    660 ml    Last BM: 2/22  Labs:   Recent Labs Lab 03/01/12 0535 03/01/12 0700 03/02/12 0450  NA 136 136 137  K 3.9 4.3 3.6  CL 96 97 98  CO2 30 29 32  BUN 29* 30* 21  CREATININE 5.86* 5.84* 4.77*  CALCIUM 8.1* 8.2* 8.4  PHOS  --  3.9  --   GLUCOSE 78 84 149*    CBG (last 3)   Recent Labs  03/02/12 2212 03/03/12 0745 03/03/12 1204  GLUCAP 114* 95 190*    Scheduled Meds: . allopurinol  150 mg Oral Daily  . aspirin EC  81 mg Oral Daily  . calcium carbonate  1 tablet Oral TID WC  . darbepoetin (ARANESP) injection - DIALYSIS  100 mcg Intravenous Q Sat-HD  . docusate sodium  100 mg Oral Daily  . doxercalciferol  4 mcg Intravenous Q T,Th,Sa-HD  . doxycycline  100 mg Oral BID  . enoxaparin (LOVENOX) injection  1 mg/kg Subcutaneous Q24H  . glimepiride  2 mg Oral QAC breakfast  . insulin aspart  0-15 Units Subcutaneous TID WC  .  metoprolol tartrate  25 mg Oral BID  . multivitamin with minerals  1 tablet Oral Daily  . pantoprazole  40 mg Oral Daily  . simvastatin  40 mg Oral QHS  . vitamin C  500 mg Oral Daily  . warfarin  2.5 mg Oral Q Fri-1800  . warfarin  5 mg Oral Custom  . Warfarin - Physician Dosing Inpatient   Does not apply q1800    Continuous Infusions:   Past Medical History  Diagnosis Date  . Diabetes mellitus   . Gout   . Hypertension   . Hyperlipidemia   . Arthritis   . Prostate cancer     s/p seed implant  . Cataract     BILATERAL-BEEN REMOVED  . Neuromuscular disorder     DABETIC NEUROPATHY-LOWER EXTREMITY  . Atrial fibrillation     on coumadin  . Aortic stenosis     moderate to severe, not felt to be a surgical candidate  . Anemia     secondary to end stage renal disease  .  Coronary artery disease     remote CABG in 1996; last stress test in 2005; last cath 2005  . Diabetic neuropathy   . Edema   . PAD (peripheral artery disease)   . Diabetic retinopathy(362.0)   . Chronic anticoagulation     on coumadin  . Shortness of breath   . Chronic kidney disease     Started dialysis October 2012  . Nephrolithiasis     prior history of percutaneous nephrostomy  . Myocardial infarction     1999 ish    Past Surgical History  Procedure Laterality Date  . Ventral hernia repair    . Inguinal hernia repair      right side  . Colonoscopy    . Kidney stone surgery    . Polypectomy    . Tonsillectomy    . Toe surgery      removal little toe right foot  . Cardiac catheterization  04/19/2003    SEVERE 2 VESSEL OBSTRUCTIVE ATHERSCLEROTIC CAD  . Coronary artery bypass graft  1996    LIMA GRAFT TO THE LAD, SEQUENTIAL SAPHENOUS  VEIN GRAFT TO THE FIRST AND SECOND DIAGONIAL BRANCHES, SEQUENTIAL SAPHENOUS VEIN GRAFT TO THE ACUTE MARGINAL, POSTERIOR DESCENDING, AND POSTERIOR LATERAL BRANCHES OF THE RIGHT CORONARY ARTERY  . Radioactive seed implant    . US echocardiography  09/06/2009    EF 55-60%  . Cardiovascular stress test  04/13/2003    EF 54%. EVIDENCE OF ANTERO-APICAL ISCHEMIA. NORMAL LV SIZE AND FUNCTION  . Tee without cardioversion  05/09/2011    Procedure: TRANSESOPHAGEAL ECHOCARDIOGRAM (TEE);  Surgeon: Laurey Morale, MD;  Location: [facility] ENDOSCOPY;  Service: Cardiovascular;  Laterality: N/A;  . Hernia repair    . Amputation  07/20/2011    Procedure: AMPUTATION BELOW KNEE;  Surgeon: Nada Libman, MD;  Location: [facility] OR;  Service: Vascular;  Laterality: Left;  . Amputation  01/25/2012    Procedure: AMPUTATION DIGIT;  Surgeon: Chuck Hint, MD;  Location: [facility] OR;  Service: Vascular;  Laterality: Right;  second  . Av fistula placement      right  . Transmetatarsal amputation Right 02/15/2012    Procedure: TRANSMETATARSAL AMPUTATION;  Surgeon: Nada Libman, MD;   Location: [facility] OR;  Service: Vascular;  Laterality: Right;    Maureen Chatters, RD, LDN Pager #: [phone] After-Hours Pager #: [phone]

## 2012-03-03 NOTE — Clinical Social Work Placement (Signed)
     Clinical Social Work Department CLINICAL SOCIAL WORK PLACEMENT NOTE 03/03/2012  Patient:  Alan [name], Alan [name]  Account Number:  [id number] Admit date:  02/29/2012  Clinical Social Worker:  Theresia Bough, Theresia Majors  Date/time:  03/03/2012 10:10 PM  Clinical Social Work is seeking post-discharge placement for this patient at the following level of care:   SKILLED NURSING   (*CSW will update this form in Epic as items are completed)   03/03/2012  Patient/family provided with [facility] Department of Clinical Social Works list of facilities offering this level of care within the geographic area requested by the patient (or if unable, by the patients family).  03/03/2012  Patient/family informed of their freedom to choose among providers that offer the needed level of care, that participate in Medicare, Medicaid or managed care program needed by the patient, have an available bed and are willing to accept the patient.  03/03/2012  Patient/family informed of [facility] ownership interest in [facility], as well as of the fact that they are under no obligation to receive care at this facility.  PASARR submitted to EDS on  PASARR number received from EDS on   FL2 transmitted to all facilities in geographic area requested by pt/family on  03/03/2012 FL2 transmitted to all facilities within larger geographic area on   Patient informed that his/her managed care company has contracts with or will negotiate with  certain facilities, including the following:     Patient/family informed of bed offers received:   Patient chooses bed at  Physician recommends and patient chooses bed at    Patient to be transferred to  on   Patient to be transferred to facility by   The following physician request were entered in Epic:   Additional Comments: Pt wife requests a new SNF search at either [place] or [facility].

## 2012-03-03 NOTE — Progress Notes (Signed)
Utilization review completed.  

## 2012-03-03 NOTE — Progress Notes (Signed)
[facility] Progress Note  03/03/2012 7:23 AM POD 3  Subjective:  No complaints.  Wants to go home  Afebrile x 24 hrs  HR 90's-120's irreg 90's-110's systolic 97% RA  Filed Vitals:   03/03/12 0305  BP: 115/64  Pulse:   Temp: 98.5 F (36.9 C)  Resp: 17    Physical Exam: Incisions:  C/d/i with staples in tact Extremities:  Good ROM of RLE  CBC    Component Value Date/Time   WBC 12.3* 03/02/2012 0450   RBC 3.19* 03/02/2012 0450   HGB 9.2* 03/02/2012 0450   HCT 28.7* 03/02/2012 0450   PLT 299 03/02/2012 0450   MCV 90.0 03/02/2012 0450   MCH 28.8 03/02/2012 0450   MCHC 32.1 03/02/2012 0450   RDW 14.8 03/02/2012 0450   LYMPHSABS 1.4 01/22/2012 0640   MONOABS 1.2* 01/22/2012 0640   EOSABS 0.0 01/22/2012 0640   BASOSABS 0.0 01/22/2012 0640    BMET    Component Value Date/Time   NA 137 03/02/2012 0450   K 3.6 03/02/2012 0450   CL 98 03/02/2012 0450   CO2 32 03/02/2012 0450   GLUCOSE 149* 03/02/2012 0450   BUN 21 03/02/2012 0450   CREATININE 4.77* 03/02/2012 0450   CALCIUM 8.4 03/02/2012 0450   CALCIUM 8.8 10/27/2010 0734   GFRNONAA 11* 03/02/2012 0450   GFRAA 12* 03/02/2012 0450    INR    Component Value Date/Time   INR 1.41 03/03/2012 0600   INR 3.18 02/06/2011     Intake/Output Summary (Last 24 hours) at 03/03/12 0723 Last data filed at 03/02/12 2325  Gross per 24 hour  Intake    737 ml  Output      0 ml  Net    737 ml     Assessment/Plan:  79 y.o. male is s/p right above knee amputation  POD 3  -right BKA stump is viable -will get social work consult for disposition as he is not candidate for CIR per CIR note this am -tachycardia this am and BP is more stable this am-will increase his lopressor today (it was decreased yesterday for hypotension) -no evidence of bleeding-will increase lovenox to 1mg /kg for his AFib while awaiting INR to become therapeutic.   Doreatha Massed, PA-C [facility] [phone] 03/03/2012 7:23  AM

## 2012-03-04 LAB — TYPE AND SCREEN
Unit division: 0
Unit division: 0

## 2012-03-04 LAB — CBC
Hemoglobin: 8.6 g/dL — ABNORMAL LOW (ref 13.0–17.0)
MCH: 28.9 pg (ref 26.0–34.0)
MCHC: 32.6 g/dL (ref 30.0–36.0)
Platelets: 314 10*3/uL (ref 150–400)

## 2012-03-04 LAB — GLUCOSE, CAPILLARY: Glucose-Capillary: 89 mg/dL (ref 70–99)

## 2012-03-04 LAB — HEPATITIS B SURFACE ANTIGEN: Hepatitis B Surface Ag: NEGATIVE

## 2012-03-04 LAB — PROTIME-INR: INR: 1.81 — ABNORMAL HIGH (ref 0.00–1.49)

## 2012-03-04 LAB — RENAL FUNCTION PANEL
Albumin: 2.2 g/dL — ABNORMAL LOW (ref 3.5–5.2)
Calcium: 8.6 mg/dL (ref 8.4–10.5)
GFR calc Af Amer: 6 mL/min — ABNORMAL LOW (ref >90)
GFR calc non Af Amer: 5 mL/min — ABNORMAL LOW (ref >90)
Glucose, Bld: 81 mg/dL (ref 70–99)
Phosphorus: 4.2 mg/dL (ref 2.3–4.6)
Potassium: 3.7 mEq/L (ref 3.5–5.1)
Sodium: 135 mEq/L (ref 135–145)

## 2012-03-04 MED ORDER — SODIUM CHLORIDE 0.9 % IV SOLN: 100.0000 mL | INTRAVENOUS | Status: DC | PRN

## 2012-03-04 MED ORDER — HEPARIN SODIUM (PORCINE) 1000 UNIT/ML DIALYSIS: 2000.0000 [IU] | INTRAMUSCULAR | Status: DC | PRN

## 2012-03-04 MED ORDER — NEPRO/CARBSTEADY PO LIQD: 237.0000 mL | ORAL | Status: DC | PRN

## 2012-03-04 MED ORDER — LIDOCAINE-PRILOCAINE 2.5-2.5 % EX CREA: 1.0000 "application " | TOPICAL_CREAM | CUTANEOUS | Status: DC | PRN

## 2012-03-04 MED ORDER — PENTAFLUOROPROP-TETRAFLUOROETH EX AERO: 1.0000 "application " | INHALATION_SPRAY | CUTANEOUS | Status: DC | PRN

## 2012-03-04 MED ORDER — DOXERCALCIFEROL 4 MCG/2ML IV SOLN
INTRAVENOUS | Status: AC
  Filled 2012-03-04: qty 2

## 2012-03-04 MED ORDER — LIDOCAINE HCL (PF) 1 % IJ SOLN: 5.0000 mL | INTRAMUSCULAR | Status: DC | PRN

## 2012-03-04 MED ORDER — ALTEPLASE 2 MG IJ SOLR
2.0000 mg | Freq: Once | INTRAMUSCULAR | Status: AC | PRN
  Filled 2012-03-04: qty 2

## 2012-03-04 MED ORDER — HEPARIN SODIUM (PORCINE) 1000 UNIT/ML DIALYSIS: 1000.0000 [IU] | INTRAMUSCULAR | Status: DC | PRN

## 2012-03-04 NOTE — Progress Notes (Signed)
Pt being combative swing fist at staff and pinching staff, attempted to calm pt he is convinced we have kidnapped him I repeatedly asked pt to not hit at staff without success.  Dr fields notified soft wrist restraints ordered will continue to monitor

## 2012-03-04 NOTE — Progress Notes (Signed)
I agree with the following treatment note after reviewing documentation.   Johnston, Hildegard Hlavac Brynn   OTR/L Pager: [phone] Office: [phone] .   

## 2012-03-04 NOTE — Progress Notes (Signed)
Subjective:   Seen on dialysis, no current complaints.  Objective: Vital signs in last 24 hours: Temp:  [97.9 F (36.6 C)-98.8 F (37.1 C)] 98 F (36.7 C) (02/25 0733) Resp:  [17-22] 17 (02/25 0340) BP: (97-112)/(40-74) 110/48 mmHg (02/25 0340) SpO2:  [94 %-99 %] 94 % (02/25 0340) Weight change:   Intake/Output from previous day: 02/24 0701 - 02/25 0700 In: 420 [P.O.:420] Out: -    EXAM: General appearance:  Alert, in no apparent distress Resp:  CTA without rales, rhonchi, or wheezes Cardio:  RRR with Gr II/VI systolic murmur, no rub GI:  + BS, soft and nontender Extremities:  B BKAs, dressings on both Access:  AVF @ RUA with BFR 400 cc/min  Lab Results:  Recent Labs  03/02/12 0450  WBC 12.3*  HGB 9.2*  HCT 28.7*  PLT 299   BMET:  Recent Labs  03/02/12 0450  NA 137  K 3.6  CL 98  CO2 32  GLUCOSE 149*  BUN 21  CREATININE 4.77*  CALCIUM 8.4   No results found for this basename: PTH,  in the last 72 hours Iron Studies:  Recent Labs  03/02/12 1004  IRON 17*  TIBC 152*  FERRITIN 1236*   Dialysis Orders: Center: [facility] on TTS .  EDW 102.5 kg Bath 2K/2.25Ca 4hrs Heparin 3400u RUA AVF 400/800 Hectorol 4ug/hd (pth 351 02/07/12) Epogen 6000u Venofer 0 Ferritn 1108 02/07/12 19 % Tsat  Assessment/Plan: 1. 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 AMS - secondary to infection, resolved. 3. ESRD - HD on TTS @ AF; last K 3.6.  HD today. 4. HTN/Volume - BP 100/52 on Metoprolol 25 mg bid; pre-HD wt 99.5 kg.  UF goal of 2.5 L today.  Lower EDW at discharge. 5. Anemia - Hgb 9.2 on Aranesp 100 mcg on Sat. 6. Secondary hyperparathyroidism - Ca 8.4 (9.7 corrected), P 3.9; Hectorol 4 mcg, CaCO3 with meals. 7. A-fib - on Metoprolol, Lovenox. 8. DM type 2 - SSI per primary.   LOS: 4 days   LYLES,CHARLES 03/04/2012,8:42 AM  Patient seen and examined.  Agree with assessment and plan as above. Vinson Moselle  MD [phone] pgr    [phone]  cell 03/04/2012, 9:52 AM

## 2012-03-04 NOTE — Procedures (Signed)
I was present at this dialysis session. I have reviewed the session itself and made appropriate changes.   Vinson Moselle, MD [employer] 03/04/2012, 9:52 AM

## 2012-03-04 NOTE — Progress Notes (Addendum)
[facility] Progress Note  03/04/2012 7:18 AM POD 4  Subjective:  Pt asleep but easily arousable.  RN states he had to place pt in restraints last night as he was combative.  Afebrile x 24 hrs VSS  94% RA  Filed Vitals:   03/04/12 0340  BP: 110/48  Pulse:   Temp: 98.8 F (37.1 C)  Resp: 17    Physical Exam: Incisions:  Bandage in place and c/d/i Extremities:  Good ROM RLE  CBC    Component Value Date/Time   WBC 12.3* 03/02/2012 0450   RBC 3.19* 03/02/2012 0450   HGB 9.2* 03/02/2012 0450   HCT 28.7* 03/02/2012 0450   PLT 299 03/02/2012 0450   MCV 90.0 03/02/2012 0450   MCH 28.8 03/02/2012 0450   MCHC 32.1 03/02/2012 0450   RDW 14.8 03/02/2012 0450   LYMPHSABS 1.4 01/22/2012 0640   MONOABS 1.2* 01/22/2012 0640   EOSABS 0.0 01/22/2012 0640   BASOSABS 0.0 01/22/2012 0640    BMET    Component Value Date/Time   NA 137 03/02/2012 0450   K 3.6 03/02/2012 0450   CL 98 03/02/2012 0450   CO2 32 03/02/2012 0450   GLUCOSE 149* 03/02/2012 0450   BUN 21 03/02/2012 0450   CREATININE 4.77* 03/02/2012 0450   CALCIUM 8.4 03/02/2012 0450   CALCIUM 8.8 10/27/2010 0734   GFRNONAA 11* 03/02/2012 0450   GFRAA 12* 03/02/2012 0450    INR    Component Value Date/Time   INR 1.81* 03/04/2012 0425   INR 3.18 02/06/2011     Intake/Output Summary (Last 24 hours) at 03/04/12 0718 Last data filed at 03/04/12 0100  Gross per 24 hour  Intake    420 ml  Output      0 ml  Net    420 ml     Assessment/Plan:  79 y.o. male is s/p right below knee amputation  POD 4  -awaiting disposition to SNF -HR rate improved with lopressor increased back to home dose and BP tolerating -right BKA stump is viable -INR increasing-continue coumadin. Will d/c lovenox when INR is > 2.0 -transfer to 6700   Doreatha Massed, PA-C [facility] [phone] 03/04/2012 7:18 AM   Spoke with [name] this am about disposition and family wanting to take pt home.  It is still the  recommendation that the pt go to a SNF at this time for rehab before returning home.  Doreatha Massed 03/04/2012 7:48 AM  Agree with above  Durene Cal

## 2012-03-04 NOTE — Progress Notes (Signed)
Dressing change done to rt residual limb.  Staples intact, incision draining small of serous secretions.  Wife at bedside and states she has done dressing changes in past on lt residual limb. Nods head she understands how to perform dressing change after watching nurse perform. Report given to RN on unit 2000 and patient's bed was changed from 2036 to 2032

## 2012-03-04 NOTE — Progress Notes (Signed)
Clinical Child psychotherapist (CSW) informed that MD recommending SNF at discharge. CSW completed assessment with both pt and family who prefer for pt to go home with [facility] services. RNCM has spoken with family and confirmed pt son and pt wife able to provide care for pt and desire for pt to return home with [facility] services. If MD feels strongly for pt to dc to SNF, MD needs to discuss with pt wife and son.   CSW has signed off please reconsult for any CSW needs.  Theresia Bough, MSW, Theresia Majors [phone]

## 2012-03-04 NOTE — Progress Notes (Signed)
PT Cancellation Note  Patient Details Name: Alan [name] MRN: [id number] DOB: Jan 29, 1931   Cancelled Treatment:    Reason Eval/Treat Not Completed: Patient at procedure or test/unavailable (in HD)   Jettson Crable B. Eiman Maret, PT, DPT [phone]   03/04/2012, 8:15 AM

## 2012-03-05 LAB — PROTIME-INR: INR: 2.19 — ABNORMAL HIGH (ref 0.00–1.49)

## 2012-03-05 NOTE — Progress Notes (Signed)
Subjective:  No current complaints, but asking for his clothes.  Objective: Vital signs in last 24 hours: Temp:  [97 F (36.1 C)-98.6 F (37 C)] 98.3 F (36.8 C) (02/26 0630) Pulse Rate:  [69-127] 92 (02/26 0630) Resp:  [15-27] 18 (02/26 0630) BP: (91-114)/(44-72) 101/72 mmHg (02/26 0630) SpO2:  [94 %-98 %] 98 % (02/26 0630) Weight:  [96.7 kg (213 lb 3 oz)-99.5 kg (219 lb 5.7 oz)] 97.9 kg (215 lb 13.3 oz) (02/26 0630) Weight change:   Intake/Output from previous day: 02/25 0701 - 02/26 0700 In: 360 [P.O.:360] Out: 2384    EXAM: General appearance:  Alert, in no apparent distress Resp:  CTA without rales, rhonchi, or wheezes Cardio:  RRR with Gr II/VI systolic murmur, no rub GI:  + BS, soft and nontender Extremities:  Bilateral BKAs, dressing on right Access:  AVF @ RUA with + bruit  Lab Results:  Recent Labs  03/04/12 0831  WBC 11.9*  HGB 8.6*  HCT 26.4*  PLT 314   BMET:  Recent Labs  03/04/12 0831  NA 135  K 3.7  CL 95*  CO2 28  GLUCOSE 81  BUN 43*  CREATININE 8.71*  CALCIUM 8.6  ALBUMIN 2.2*   No results found for this basename: PTH,  in the last 72 hours Iron Studies:  Recent Labs  03/02/12 1004  IRON 17*  TIBC 152*  FERRITIN 1236*   Dialysis Orders: Center: [facility] on TTS .  EDW 102.5 kg Bath 2K/2.25Ca 4hrs Heparin 3400u RUA AVF 400/800 Hectorol 4ug/hd (pth 351 02/07/12) Epogen 6000u Venofer 0 Ferritn 1108 02/07/12 19 % Tsat  Assessment/Plan: 1. Right BKA - 2/21 per [name] for non-healing TMA, per [facility]. 2. AMS - secondary to infection, slightly confused, WBCs 11.9. 3. ESRD - HD on TTS @ AF; last K 3.7. HD tomorrow. 4. HTN/Volume - BP 101/72 on Metoprolol 25 mg bid; pre-HD wt 97.9 kg s/p net UF 2.4 L yesterday. Lower EDW at discharge. 5. Anemia - Hgb 8.6 on Aranesp 100 mcg on Sat. 6. Secondary hyperparathyroidism - Ca 8.6 (10 corrected), P 4.2; Hectorol 4 mcg, CaCO3 with meals. 7. A-fib - on Metoprolol, Lovenox. 8. DM type 2 - SSI  per primary.    LOS: 5 days   LYLES,CHARLES 03/05/2012,8:09 AM   Patient seen and examined.  Agree with assessment and plan as above. For probable discharge today.  Vinson Moselle  MD [phone] pgr    [phone] cell 03/05/2012, 10:04 AM

## 2012-03-05 NOTE — Progress Notes (Signed)
Physical Therapy Treatment Patient Details Name: Alan [name] MRN: [id number] DOB: 1931/08/14 Today's Date: 03/05/2012 Time: 6213-0865 PT Time Calculation (min): 40 min  PT Assessment / Plan / Recommendation Comments on Treatment Session  Pt now Bil BKA with new right BKA.  Pt and family demonstrate that they are able to transfer safely to and from wheelchair with a sliding board.  They can place board and wheelchair  appropriately and cue pt effectively.   The family insisted to me that they can assist with all mobility at home and have everything set up at home.  Questioned if they now had removable armrests on chair so he can slide over and they stated that they do.  Recommended that they contintue sliding board transfers with prosthesis on left LE as safest option and they all agreed.  Nursing had obtained d/c paperwork and this PT felt that pt and family showed that they have the capability to perform transfers.  HHPT f/u will be necessary for  continued education and progression.      Follow Up Recommendations  Home health PT;Supervision/Assistance - 24 hour                 Equipment Recommendations  None recommended by PT        Frequency     Plan Discharge plan needs to be updated    Precautions / Restrictions Precautions Precautions: Fall Precaution Comments: Bil BKA Required Braces or Orthoses: Other Brace/Splint Other Brace/Splint: right limb guard Restrictions Weight Bearing Restrictions: Yes RLE Weight Bearing: Non weight bearing   Pertinent Vitals/Pain VSS, No pain    Mobility  Bed Mobility Bed Mobility: Not assessed Details for Bed Mobility Assistance: Pt sitting on EOB on arrival.  Wife and daughter present.  Discussed with wife and daughter safety with bed mobility.  Wife states that she assisted pt to EOB without problem and got pt dressed without problem.  This PT  instructed pt, wife and daughter in how to place limb guard on.   Transfers Transfers:  Lateral/Scoot Transfers Sit to Stand: Not tested (comment) Stand to Sit: Not tested (comment) Squat Pivot Transfers: Not tested (comment) Lateral/Scoot Transfers: 3: Mod assist;From elevated surface;With armrests removed;With slide board Details for Transfer Assistance: Obtained a wheelchair and sliding board.  Pt able to place board under his thigh. Wife and pt able to demonstrate that pt can perform lateral scoot transfer with sliding board to wheelchair with wife assisting and cuing prn.  This was without prosthesis.  Recommend to pt and wife to use prosthesis with transfers at home to make them easier.  Wife only had to hold board in place and provide facilitation and guidance at hips as well as cue pt.  Took pt incr time but he did well.  This PT offered to go with patient and family to assist him into car.  Wife and pt able to position wheelchair and board appropriately and wife cued pt through the transfer to get into car from w/c via sliding board.  Pt took incr time as he was fatigued but did demonstrate safe technique getting into car.   Ambulation/Gait Ambulation/Gait Assistance: Not tested (comment) Stairs: No Wheelchair Mobility Wheelchair Mobility: No     PT Goals Acute Rehab PT Goals PT Goal: Supine/Side to Sit - Progress: Progressing toward goal PT Goal: Sit at Edge Of Bed - Progress: Met PT Transfer Goal: Bed to Chair/Chair to Bed - Progress: Met  Visit Information  Last PT Received On: 03/05/12  Assistance Needed: +1    Subjective Data  Subjective: "We need to go home."   Cognition  Cognition Overall Cognitive Status: Impaired Area of Impairment: Memory;Following commands;Problem solving;Awareness of deficits Arousal/Alertness: Awake/alert Orientation Level: Disoriented to;Place;Time Behavior During Session: Flat affect Memory: Decreased recall of precautions Following Commands: Follows one step commands inconsistently;Follows one step commands with increased  time Problem Solving: Pt a little more aware and participatory questionably secondary to pt wanting to go home     Balance  Static Sitting Balance Static Sitting - Level of Assistance: 5: Stand by assistance Dynamic Sitting Balance Dynamic Sitting - Balance Support: Bilateral upper extremity supported;During functional activity Dynamic Sitting - Level of Assistance: 5: Stand by assistance Static Standing Balance Static Standing - Level of Assistance: Not tested (comment)  End of Session PT - End of Session Equipment Utilized During Treatment: Gait belt Activity Tolerance: Patient tolerated treatment well Patient left: with family/visitor present;Other (comment) (Assisted pt into car as pt d/c home with family) Nurse Communication: Mobility status        INGOLD,Raena Pau 03/05/2012, 11:56 AM [facility] [phone] (pager)

## 2012-03-05 NOTE — Progress Notes (Signed)
[facility] Progress Note  03/05/2012 7:23 AM POD 5  Subjective:  Confused this am  Afebrile x 24 hrs VSS   Filed Vitals:   03/05/12 0630  BP: 101/72  Pulse: 92  Temp: 98.3 F (36.8 C)  Resp: 18    Physical Exam: Incisions:  C/d/i with staples in tact-minimal serous drainage on bandage Extremities:  Good movement of RLE  CBC    Component Value Date/Time   WBC 11.9* 03/04/2012 0831   RBC 2.98* 03/04/2012 0831   HGB 8.6* 03/04/2012 0831   HCT 26.4* 03/04/2012 0831   PLT 314 03/04/2012 0831   MCV 88.6 03/04/2012 0831   MCH 28.9 03/04/2012 0831   MCHC 32.6 03/04/2012 0831   RDW 15.0 03/04/2012 0831   LYMPHSABS 1.4 01/22/2012 0640   MONOABS 1.2* 01/22/2012 0640   EOSABS 0.0 01/22/2012 0640   BASOSABS 0.0 01/22/2012 0640    BMET    Component Value Date/Time   NA 135 03/04/2012 0831   K 3.7 03/04/2012 0831   CL 95* 03/04/2012 0831   CO2 28 03/04/2012 0831   GLUCOSE 81 03/04/2012 0831   BUN 43* 03/04/2012 0831   CREATININE 8.71* 03/04/2012 0831   CALCIUM 8.6 03/04/2012 0831   CALCIUM 8.8 10/27/2010 0734   GFRNONAA 5* 03/04/2012 0831   GFRAA 6* 03/04/2012 0831    INR    Component Value Date/Time   INR 2.19* 03/05/2012 0445   INR 3.18 02/06/2011     Intake/Output Summary (Last 24 hours) at 03/05/12 0723 Last data filed at 03/04/12 2245  Gross per 24 hour  Intake    360 ml  Output   2384 ml  Net  -2024 ml     Assessment/Plan:  79 y.o. male is s/p right below knee amputation  POD 5  -PT to work with pt and wife this am to show family safe transport of pt -INR is therapeutic -will d/c lovenox -right BKA stump is viable    Doreatha Massed, PA-C [facility] [phone] 03/05/2012 7:23 AM

## 2012-03-05 NOTE — Progress Notes (Signed)
I walked in pt's room and the CNA was being yelled at by pt's wife.  "You called me last night and told me to be here at 830 to pick him up!"  "we are here to take him home!"  "the disorganization of this place is disgusting!"  "Somebody better get their s___ together!  We're taking him [place] here!"  I explained to daughter and wife that I was told that physical therapy was to work with pt and wife to make sure he could safely transfer and then he could be discharged, but that I didn't have a discharge order at this time.  Wife stated, "Well they can mail it, they mail me everything else!"  Wife and daughter both irate/wife was screaming inappropriately, throwing her hands around.  Physical therapy was notified as well as Samantha,PA.  Case Manager was notified to set up Home Health.  Pt. Was discharged with wife and daughter.  Physical therapist went with them to car to make sure pt was safely transferred to car.  D/c papers were signed by wife in hall but not reviewed due to their urgence to leave.

## 2012-03-06 LAB — PROTIME-INR: INR: 3.2 — AB (ref <=1.1)

## 2012-03-07 ENCOUNTER — Ambulatory Visit: Payer: Self-pay | Admitting: Cardiology

## 2012-03-07 DIAGNOSIS — Z7901 Long term (current) use of anticoagulants: Secondary | ICD-10-CM

## 2012-03-07 LAB — POCT INR: INR: 4.5

## 2012-03-07 NOTE — Discharge Summary (Signed)
I agree with the above. The patient is status post right below-knee amputation. He very desperately wanted to down. This was orchestrated with physical therapy and his wife. He'll follow me in one month  Alan [name]

## 2012-03-10 ENCOUNTER — Ambulatory Visit: Payer: Self-pay | Admitting: Cardiovascular Disease

## 2012-03-10 ENCOUNTER — Encounter: Payer: Self-pay | Admitting: Cardiology

## 2012-03-10 DIAGNOSIS — Z7901 Long term (current) use of anticoagulants: Secondary | ICD-10-CM

## 2012-03-10 NOTE — Progress Notes (Signed)
This encounter was created in error - please disregard.

## 2012-03-12 ENCOUNTER — Other Ambulatory Visit: Payer: Self-pay | Admitting: *Deleted

## 2012-03-12 MED ORDER — WARFARIN SODIUM 5 MG PO TABS: 5.0000 mg | ORAL_TABLET | ORAL | Status: DC

## 2012-03-28 ENCOUNTER — Encounter: Payer: Self-pay | Admitting: Surgery

## 2012-03-28 ENCOUNTER — Telehealth: Payer: Self-pay

## 2012-03-28 NOTE — Telephone Encounter (Signed)
Wife called to report pt. bumped his right BKA stump incision on the w/c ramp yesterday.  Stated that there was bleeding from incision after the incident;  applied a clean dressing over the incision.  States there is a sm. amt. of bleeding from incision today.  Reports that the staples are intact and the incision remains closed.  Pt. has appt. to see [name] on 03/31/12 @ 8:30 AM.  Wife advised to gently cleanse the incision with an antibacterial soap, rinse well, and apply a clean dressing daily.  Advised to keep appt. on 3/24.  Advised to go to ER this weekend if pt. has increased bleeding, if incision opens-up, or if fever/ chills.  Wife verb. understanding of instructions.

## 2012-03-31 ENCOUNTER — Encounter: Payer: Self-pay | Admitting: Surgery

## 2012-03-31 ENCOUNTER — Ambulatory Visit (INDEPENDENT_AMBULATORY_CARE_PROVIDER_SITE_OTHER): Payer: MEDICARE | Admitting: Internal Medicine

## 2012-03-31 ENCOUNTER — Ambulatory Visit (INDEPENDENT_AMBULATORY_CARE_PROVIDER_SITE_OTHER): Payer: MEDICARE | Admitting: Surgery

## 2012-03-31 VITALS — BP 127/53 | HR 78 | Ht 72.0 in | Wt 227.0 lb

## 2012-03-31 DIAGNOSIS — I70269 Atherosclerosis of native arteries of extremities with gangrene, unspecified extremity: Secondary | ICD-10-CM

## 2012-03-31 DIAGNOSIS — Z7901 Long term (current) use of anticoagulants: Secondary | ICD-10-CM

## 2012-03-31 LAB — POCT INR: INR: 5.3

## 2012-03-31 NOTE — Progress Notes (Signed)
Patient's staples were removed from R BKA today. The incision was cleaned, then staples removed. Steri strips were applied due to dry eschar and slight drainage. Patient tolerated well. The patient and his wife were given verbal wound care instructions and gave verbal understanding. Yulissa Needham Maness -[place] CMA, [place]

## 2012-03-31 NOTE — Progress Notes (Signed)
The patient is back today for followup he is status post right below knee amputation on 02/29/2012. He was discharged to home. He has a prosthesis on his left below-knee amputation. He appears to be getting along very well at home. He does not complain of a significant amount of pain. He did fall on his stump recently. There are 2 small drops of drainage on the anterior side of the incision. Otherwise, the wound is healing nicely. I will remove his staples today and place Steri-Strips. He will followup with me in 2 months for a wound check. He is very eager to get a second prosthesis.

## 2012-03-31 NOTE — Progress Notes (Deleted)
Patient ID: Alan [name], male   DOB: Jan 22, 1931, 79 y.o.   MRN: [id number]

## 2012-04-02 ENCOUNTER — Encounter: Payer: Self-pay | Admitting: Internal Medicine

## 2012-04-03 LAB — PROTIME-INR: INR: 2.6 — AB (ref 0.9–1.1)

## 2012-04-04 ENCOUNTER — Ambulatory Visit (INDEPENDENT_AMBULATORY_CARE_PROVIDER_SITE_OTHER): Payer: MEDICARE

## 2012-04-04 ENCOUNTER — Telehealth: Payer: Self-pay

## 2012-04-04 DIAGNOSIS — Z7901 Long term (current) use of anticoagulants: Secondary | ICD-10-CM

## 2012-04-04 DIAGNOSIS — I4891 Unspecified atrial fibrillation: Secondary | ICD-10-CM

## 2012-04-04 NOTE — Telephone Encounter (Signed)
[facility] with [facility] called to see if we are still following patient and if recert would be appropriate.  He is now a bilateral amputee.  Please advise.

## 2012-04-07 ENCOUNTER — Telehealth: Payer: Self-pay | Admitting: *Deleted

## 2012-04-07 NOTE — Telephone Encounter (Signed)
I spoke with Alan [name] and told her we were not following him, the last note was from Dr Myra Gianotti vasc surgery 03/31/12, so they would most likely be the ones to contact.

## 2012-04-07 NOTE — Telephone Encounter (Signed)
Alan [name], Physical Therapist from [facility] requesting verbal order to continue physical therapy.

## 2012-04-10 ENCOUNTER — Encounter: Payer: Self-pay | Admitting: Vascular Surgery

## 2012-04-10 ENCOUNTER — Ambulatory Visit (INDEPENDENT_AMBULATORY_CARE_PROVIDER_SITE_OTHER): Payer: MEDICARE | Admitting: Vascular Surgery

## 2012-04-10 ENCOUNTER — Telehealth: Payer: Self-pay | Admitting: Vascular Surgery

## 2012-04-10 VITALS — BP 92/55 | HR 81 | Resp 16 | Wt 225.0 lb

## 2012-04-10 DIAGNOSIS — L98499 Non-pressure chronic ulcer of skin of other sites with unspecified severity: Secondary | ICD-10-CM

## 2012-04-10 DIAGNOSIS — I739 Peripheral vascular disease, unspecified: Secondary | ICD-10-CM

## 2012-04-10 MED ORDER — CLINDAMYCIN HCL 300 MG PO CAPS: 300.0000 mg | ORAL_CAPSULE | Freq: Three times a day (TID) | ORAL | Status: DC

## 2012-04-10 NOTE — Progress Notes (Signed)
[facility] POST OPERATIVE OFFICE NOTE  CC:  F/u for surgery  HPI:  This is a 79 y.o. male who is s/p right BKA 02/29/12 by [name].  He states the [facility] RN came out and evaluated the wound and stated he needed to be seen.  The pt states he has had some brownish drainage from the wound.  He denies fevers or chills.  He states that otherwise, he has been doing well at home.    Allergies  Allergen Reactions  . Ancef (Cefazolin) Rash    Patient cant recall if allergy to this med exists.     Current Outpatient Prescriptions  Medication Sig Dispense Refill  . acetaminophen (TYLENOL) 500 MG tablet Take 1,000 mg by mouth every 8 (eight) hours as needed for pain.      [name] allopurinol (ZYLOPRIM) 300 MG tablet Take 0.5 tablets (150 mg total) by mouth daily.  30 tablet  1  . aspirin EC 81 MG tablet Take 1 tablet (81 mg total) by mouth daily.      . calcium carbonate (TUMS - DOSED IN MG ELEMENTAL CALCIUM) 500 MG chewable tablet Chew 1 tablet by mouth 3 (three) times daily with meals. Phosphorous binder      . doxycycline (VIBRA-TABS) 100 MG tablet Take 100 mg by mouth 2 (two) times daily. For 10 days starting on 02/24/12      . enoxaparin (LOVENOX) 100 MG/ML injection Inject 1 mL (100 mg total) into the skin daily.  0 Syringe    . estazolam (PROSOM) 1 MG tablet Take 1 mg by mouth at bedtime as needed (for sleep).      [name] glimepiride (AMARYL) 2 MG tablet Take 2 mg by mouth daily before breakfast.      . ibuprofen (ADVIL,MOTRIN) 200 MG tablet Take 200 mg by mouth every 6 (six) hours as needed. For pain      . metoprolol tartrate (LOPRESSOR) 25 MG tablet Take 37.5 mg by mouth 2 (two) times daily. Takes 1.5 tablets twice daily      . Multiple Vitamin (MULTIVITAMIN WITH MINERALS) TABS Take 1 tablet by mouth daily.      [name] oxyCODONE (ROXICODONE) 5 MG immediate release tablet Take 1 tablet (5 mg total) by mouth every 6 (six) hours as needed for pain.  30 tablet  0  . phenylephrine-shark liver  oil-mineral oil-petrolatum (PREPARATION H) 0.25-3-14-71.9 % rectal ointment Place 1 application rectally 2 (two) times daily as needed for hemorrhoids.      . simvastatin (ZOCOR) 40 MG tablet Take 1 tablet (40 mg total) by mouth at bedtime.  30 tablet  1  . vitamin C (ASCORBIC ACID) 500 MG tablet Take 500 mg by mouth daily.      [name] warfarin (COUMADIN) 5 MG tablet Take 1 tablet (5 mg total) by mouth as directed. Take As Directed by Anticoagulation Clinic  30 tablet  3   No current facility-administered medications for this visit.     ROS:  See HPI  Physical Exam:  Filed Vitals:   04/10/12 1213  BP: 92/55  Pulse: 81  Resp: 16    Incision:  The lateral portion over to cross the midline is separated.  There is an eschar above the medial portion of the incision. Extremities:  Good ROM of Right stump   A/P:  This is a 79 y.o. male here for wound check.  -He does have some separation of his right BKA incision.  There is mild erythema.  I do not see any drainage at this time from the wound. -Dr. Darrick Penna recommends 10 days of ABx.  The pt is allergic to Ancef and their was a very high interaction b/w levaquin and coumadin, therefore, he was placed on clindamycin 300 mg tid for 10 days. -will get the [facility] RN to do dressing changes daily with santyl daily. He will f/u with [name] in 2 weeks.  Doreatha Massed, PA-C [facility] [phone]  Clinic MD:  Pt seen and examined in conjunction with Dr. Darrick Penna   History and exam details as above. The patient has had breakdown of most of the anterior portion of his incision of his right below-knee amputation. This is fairly superficial in nature. However he has had some brownish drainage from this. There is no real obvious abscess or surrounding cellulitis. However the drainage is been experiencing suggesting maybe some component of infection. We'll start local wound care with Santyl on his BKA for enzymatic debridement today. I  also placed him on clindamycin 3 times daily for 10 day course. He has a pre-existing allergy to Ancef.  The patient will followup with [name] in 2 weeks.  Fabienne Bruns, MD [facility] [place] Office: [phone] Pager: [phone]

## 2012-04-10 NOTE — Telephone Encounter (Addendum)
Message copied by Shari Prows on Thu Apr 10, 2012 10:45 AM ------      Message from: Phillips Odor      Created: Thu Apr 10, 2012 10:16 AM      Regarding: add on today       Please add to Dr. Darrick Penna schedule as a work-in; [facility] RN reported red, tender area on right stump incision/odorous drainage; "looks infected".  Pt. Coming to office now.   ------ I scheduled an appt for the above pt for today at 11:45am. I requested medical records to pull the pt's chart for the front desk/awt

## 2012-04-11 ENCOUNTER — Other Ambulatory Visit: Payer: Self-pay

## 2012-04-11 MED ORDER — COLLAGENASE 250 UNIT/GM EX OINT: TOPICAL_OINTMENT | CUTANEOUS | Status: DC

## 2012-04-14 ENCOUNTER — Ambulatory Visit (INDEPENDENT_AMBULATORY_CARE_PROVIDER_SITE_OTHER): Payer: MEDICARE | Admitting: Cardiology

## 2012-04-14 DIAGNOSIS — Z7901 Long term (current) use of anticoagulants: Secondary | ICD-10-CM

## 2012-04-14 DIAGNOSIS — I4891 Unspecified atrial fibrillation: Secondary | ICD-10-CM

## 2012-04-21 ENCOUNTER — Telehealth: Payer: Self-pay | Admitting: *Deleted

## 2012-04-21 NOTE — Telephone Encounter (Signed)
Zadie Cleverly., Physical Therapist from [facility] called stating patient had informed her that he no longer wanted physical therapy. He explained that with going to dialysis three times weekly it was just too much.

## 2012-04-24 ENCOUNTER — Encounter: Payer: Self-pay | Admitting: Surgery

## 2012-04-25 ENCOUNTER — Telehealth: Payer: Self-pay | Admitting: *Deleted

## 2012-04-25 NOTE — Telephone Encounter (Signed)
Talked with [facility]  Nurse and she states that she has called pt and no answer to phone and instructed to do INR on Monday  04/28/2012 and she states will do so.

## 2012-04-28 ENCOUNTER — Ambulatory Visit (INDEPENDENT_AMBULATORY_CARE_PROVIDER_SITE_OTHER): Payer: MEDICARE | Admitting: Cardiology

## 2012-04-28 ENCOUNTER — Encounter: Payer: Self-pay | Admitting: Surgery

## 2012-04-28 ENCOUNTER — Ambulatory Visit (INDEPENDENT_AMBULATORY_CARE_PROVIDER_SITE_OTHER): Payer: MEDICARE | Admitting: Surgery

## 2012-04-28 VITALS — BP 102/65 | HR 68 | Resp 16 | Ht 72.0 in | Wt 235.0 lb

## 2012-04-28 DIAGNOSIS — I4891 Unspecified atrial fibrillation: Secondary | ICD-10-CM

## 2012-04-28 DIAGNOSIS — Z7901 Long term (current) use of anticoagulants: Secondary | ICD-10-CM

## 2012-04-28 DIAGNOSIS — Z5189 Encounter for other specified aftercare: Secondary | ICD-10-CM | POA: Insufficient documentation

## 2012-04-28 DIAGNOSIS — I739 Peripheral vascular disease, unspecified: Secondary | ICD-10-CM

## 2012-04-28 NOTE — Progress Notes (Signed)
The patient is status post bilateral below knee amputations. The most recent was his right leg. The staples were taken out approximately 3 weeks ago he developed wound complications and was started on enzymatic debridement and antibiotics.  The wound appears to be superficial to the fascia today. I debrided some of the fibrinopurulent tissue. There was no foul odor. There was no drainage. No surrounding erythema was present.  I've recommended continuation of enzymatic debridement and local wound care. I suspect this will heal although it may take several months. The patient will come by to see me in one month for a wound check

## 2012-05-02 ENCOUNTER — Telehealth: Payer: Self-pay

## 2012-05-02 NOTE — Telephone Encounter (Signed)
Received call from April at [facility], she stated patient's diastolic blood pressure has been low in 40's.B/P today 110/40.Patient decreased metoprolol on his own to 25 mg twice a day.Patient past due to see Dr.Jordan.Appointment scheduled with Norma Fredrickson NP 05/05/12.

## 2012-05-05 ENCOUNTER — Ambulatory Visit (INDEPENDENT_AMBULATORY_CARE_PROVIDER_SITE_OTHER): Payer: MEDICARE | Admitting: Nurse Practitioner

## 2012-05-05 ENCOUNTER — Encounter: Payer: Self-pay | Admitting: Nurse Practitioner

## 2012-05-05 VITALS — BP 90/60 | HR 54 | Ht 72.0 in | Wt 228.0 lb

## 2012-05-05 DIAGNOSIS — I951 Orthostatic hypotension: Secondary | ICD-10-CM

## 2012-05-05 DIAGNOSIS — I350 Nonrheumatic aortic (valve) stenosis: Secondary | ICD-10-CM

## 2012-05-05 DIAGNOSIS — I359 Nonrheumatic aortic valve disorder, unspecified: Secondary | ICD-10-CM

## 2012-05-05 LAB — BASIC METABOLIC PANEL
BUN: 47 mg/dL — ABNORMAL HIGH (ref 6–23)
CO2: 30 mEq/L (ref 19–32)
Calcium: 8.4 mg/dL (ref 8.4–10.5)
Chloride: 95 mEq/L — ABNORMAL LOW (ref 96–112)
Creatinine, Ser: 8.5 mg/dL (ref 0.4–1.5)
GFR: 7.86 mL/min — CL (ref >60.00)
Glucose, Bld: 155 mg/dL — ABNORMAL HIGH (ref 70–99)
Potassium: 4.9 mEq/L (ref 3.5–5.1)
Sodium: 136 mEq/L (ref 135–145)

## 2012-05-05 LAB — CBC WITH DIFFERENTIAL/PLATELET
Basophils Absolute: 0 10*3/uL (ref 0.0–0.1)
Basophils Relative: 0.5 % (ref 0.0–3.0)
Eosinophils Absolute: 0.1 10*3/uL (ref 0.0–0.7)
Eosinophils Relative: 2 % (ref 0.0–5.0)
HCT: 38.8 % — ABNORMAL LOW (ref 39.0–52.0)
Hemoglobin: 12.5 g/dL — ABNORMAL LOW (ref 13.0–17.0)
Lymphocytes Relative: 22.5 % (ref 12.0–46.0)
Lymphs Abs: 1.5 10*3/uL (ref 0.7–4.0)
MCHC: 32.3 g/dL (ref 30.0–36.0)
MCV: 88.3 fl (ref 78.0–100.0)
Monocytes Absolute: 0.4 10*3/uL (ref 0.1–1.0)
Monocytes Relative: 6.5 % (ref 3.0–12.0)
Neutro Abs: 4.7 10*3/uL (ref 1.4–7.7)
Neutrophils Relative %: 68.5 % (ref 43.0–77.0)
Platelets: 161 10*3/uL (ref 150.0–400.0)
RBC: 4.39 Mil/uL (ref 4.22–5.81)
RDW: 16.9 % — ABNORMAL HIGH (ref 11.5–14.6)
WBC: 6.8 10*3/uL (ref 4.5–10.5)

## 2012-05-05 MED ORDER — METOPROLOL TARTRATE 25 MG PO TABS: 12.5000 mg | ORAL_TABLET | Freq: Two times a day (BID) | ORAL | Status: DC

## 2012-05-05 NOTE — Patient Instructions (Signed)
We are going to arrange for an ultrasound of your heart  We need to check labs today  Cut the Metoprolol back to 1/2 of a 25 mg two times a day (12.5 mg)  If your blood pressure remains low, we will stop the Metoprolol  Call the [facility] office at [phone] if you have any questions, problems or concerns.

## 2012-05-05 NOTE — Progress Notes (Signed)
Alan [name] Date of Birth: [birth year] Medical Record #[id number]  History of Present Illness: Alan [name] is seen back today for a follow up visit. He is seen for Dr. Swazil[place]. Last visit here was in June of 2013. He has ESRD on hemodialysis, chronic atrial fib - on coumadin, gangrene of his toes with past amputations, gout, HLD, diabetes, HTN, prostate cancer, moderate to severe AS (not felt to be a candidate for surgical intervention), CAD with remote CABG in 1996 and anemia of chronic disease.   Last seen in June of 2013. Undergoing angiography with [facility]. [facility] called last week to report low diastolic blood pressures (110/40). Patient decreased his metoprolol on his own to 25 mg BID.   He comes back today. He is here with his son, Alan [name]. Now in a wheelchair. Since he was last here, he has had both lower legs amputated. Has a prosthesis on the left. Delayed healing on the right. On Midodrine now as well - I presume for low blood pressure. Says its staying low. He is not having any chest pain, no shortness of breath and has not passed out. He is weak - worse on his dialysis days. BP is still low but relatively asymptomatic. He did cut his Metoprolol back to 25 mg BID.    Current Outpatient Prescriptions on File Prior to Visit  Medication Sig Dispense Refill  . acetaminophen (TYLENOL) 500 MG tablet Take 1,000 mg by mouth every 8 (eight) hours as needed for pain.      [name] allopurinol (ZYLOPRIM) 300 MG tablet Take 0.5 tablets (150 mg total) by mouth daily.  30 tablet  1  . aspirin EC 81 MG tablet Take 1 tablet (81 mg total) by mouth daily.      . calcium carbonate (TUMS - DOSED IN MG ELEMENTAL CALCIUM) 500 MG chewable tablet Chew 1 tablet by mouth 3 (three) times daily with meals. Phosphorous binder      . clindamycin (CLEOCIN) 300 MG capsule Take 1 capsule (300 mg total) by mouth 3 (three) times daily.  30 capsule  0  . collagenase (SANTYL) ointment Apply small amt. to wound of  right stump incision daily, and cover with dry gauze dressing  30 g  1  . doxycycline (VIBRA-TABS) 100 MG tablet Take 100 mg by mouth 2 (two) times daily. For 10 days starting on 02/24/12      . enoxaparin (LOVENOX) 100 MG/ML injection Inject 1 mL (100 mg total) into the skin daily.  0 Syringe    . estazolam (PROSOM) 1 MG tablet Take 1 mg by mouth at bedtime as needed (for sleep).      [name] glimepiride (AMARYL) 2 MG tablet Take 2 mg by mouth daily before breakfast.      . ibuprofen (ADVIL,MOTRIN) 200 MG tablet Take 200 mg by mouth every 6 (six) hours as needed. For pain      . midodrine (PROAMATINE) 10 MG tablet 3 (three) times daily.       . Multiple Vitamin (MULTIVITAMIN WITH MINERALS) TABS Take 1 tablet by mouth daily.      [name] oxyCODONE (ROXICODONE) 5 MG immediate release tablet Take 1 tablet (5 mg total) by mouth every 6 (six) hours as needed for pain.  30 tablet  0  . phenylephrine-shark liver oil-mineral oil-petrolatum (PREPARATION H) 0.25-3-14-71.9 % rectal ointment Place 1 application rectally 2 (two) times daily as needed for hemorrhoids.      . simvastatin (ZOCOR) 40 MG tablet Take  1 tablet (40 mg total) by mouth at bedtime.  30 tablet  1  . vitamin C (ASCORBIC ACID) 500 MG tablet Take 500 mg by mouth daily.      [name] warfarin (COUMADIN) 5 MG tablet Take 1 tablet (5 mg total) by mouth as directed. Take As Directed by Anticoagulation Clinic  30 tablet  3   No current facility-administered medications on file prior to visit.    Allergies  Allergen Reactions  . Ancef (Cefazolin) Rash    Patient cant recall if allergy to this med exists.     Past Medical History  Diagnosis Date  . Diabetes mellitus   . Gout   . Hypertension   . Hyperlipidemia   . Arthritis   . Prostate cancer     s/p seed implant  . Cataract     BILATERAL-BEEN REMOVED  . Neuromuscular disorder     DABETIC NEUROPATHY-LOWER EXTREMITY  . Atrial fibrillation     on coumadin  . Aortic stenosis     moderate to severe,  not felt to be a surgical candidate  . Anemia     secondary to end stage renal disease  . Coronary artery disease     remote CABG in 1996; last stress test in 2005; last cath 2005  . Diabetic neuropathy   . Edema   . PAD (peripheral artery disease)   . Diabetic retinopathy(362.0)   . Chronic anticoagulation     on coumadin  . Shortness of breath   . Chronic kidney disease     Started dialysis October 2012  . Nephrolithiasis     prior history of percutaneous nephrostomy  . Myocardial infarction     1999 ish    Past Surgical History  Procedure Laterality Date  . Ventral hernia repair    . Inguinal hernia repair      right side  . Colonoscopy    . Kidney stone surgery    . Polypectomy    . Tonsillectomy    . Toe surgery      removal little toe right foot  . Cardiac catheterization  04/19/2003    SEVERE 2 VESSEL OBSTRUCTIVE ATHERSCLEROTIC CAD  . Coronary artery bypass graft  1996    LIMA GRAFT TO THE LAD, SEQUENTIAL SAPHENOUS  VEIN GRAFT TO THE FIRST AND SECOND DIAGONIAL BRANCHES, SEQUENTIAL SAPHENOUS VEIN GRAFT TO THE ACUTE MARGINAL, POSTERIOR DESCENDING, AND POSTERIOR LATERAL BRANCHES OF THE RIGHT CORONARY ARTERY  . Radioactive seed implant    . US echocardiography  09/06/2009    EF 55-60%  . Cardiovascular stress test  04/13/2003    EF 54%. EVIDENCE OF ANTERO-APICAL ISCHEMIA. NORMAL LV SIZE AND FUNCTION  . Tee without cardioversion  05/09/2011    Procedure: TRANSESOPHAGEAL ECHOCARDIOGRAM (TEE);  Surgeon: Laurey Morale, MD;  Location: [facility] END[facility];  Service: Cardiovascular;  Laterality: N/A;  . Hernia repair    . Amputation  07/20/2011    Procedure: AMPUTATION BELOW KNEE;  Surgeon: Nada Libman, MD;  Location: [facility] OR;  Service: Vascular;  Laterality: Left;  . Amputation  01/25/2012    Procedure: AMPUTATION DIGIT;  Surgeon: Chuck Hint, MD;  Location: [facility] OR;  Service: Vascular;  Laterality: Right;  second  . Av fistula placement      right  . Transmetatarsal  amputation Right 02/15/2012    Procedure: TRANSMETATARSAL AMPUTATION;  Surgeon: Nada Libman, MD;  Location: [facility] OR;  Service: Vascular;  Laterality: Right;  . Amputation Right 02/29/2012  Procedure: AMPUTATION BELOW KNEE;  Surgeon: Nada Libman, MD;  Location: [facility] OR;  Service: Vascular;  Laterality: Right;  Right BKA    History  Smoking status  . Former Smoker -- 2 years  . Types: Cigarettes  . Quit date: 04/10/1968  Smokeless tobacco  . Former Neurosurgeon  . Types: Chew    History  Alcohol Use No    Family History  Problem Relation Age of Onset  . Heart disease Father   . Hypertension Brother   . Stroke Brother   . Diabetes Mother     Pt. not sure, but he thinks she did.    Review of Systems: The review of systems is per the HPI.  All other systems were reviewed and are negative.  Physical Exam: BP 90/60  Pulse 54  Ht 6' (1.829 m)  Wt 228 lb (103.42 kg)  BMI 30.92 kg/m2 Patient is very pleasant and in no acute distress. He does look chronically ill. Skin is warm and dry. Color is pale.  HEENT is unremarkable. Normocephalic/atraumatic. PERRL. Sclera are nonicteric. Neck is supple. No masses. No JVD. Lungs are fairly clear. Cardiac exam shows an irregular rhythm. Harsh outflow murmur noted. Abdomen is soft. Extremities are without edema. Prosthesis on the left. R BKA with dressing in place. Gait is not tested. No gross neurologic deficits noted.  LABORATORY DATA: PENDING  EKG today shows atrial flutter with a slow ventricular response. Rate of 50.    Lab Results  Component Value Date   WBC 11.9* 03/04/2012   HGB 8.6* 03/04/2012   HCT 26.4* 03/04/2012   PLT 314 03/04/2012   GLUCOSE 81 03/04/2012   ALT 8 02/18/2012   AST 14 02/18/2012   NA 135 03/04/2012   K 3.7 03/04/2012   CL 95* 03/04/2012   CREATININE 8.71* 03/04/2012   BUN 43* 03/04/2012   CO2 28 03/04/2012   TSH 1.492 04/04/2010   INR 1.9 04/28/2012   HGBA1C 5.8* 02/29/2012   Echo Study Conclusions from May of  2013  - Left ventricle: The cavity size was normal. Wall thickness was increased in a pattern of mild LVH. Systolic function was normal. The estimated ejection fraction was in the range of 55% to 60%. Wall motion was normal; there were no regional wall motion abnormalities. - Aortic valve: Trileaflet; severely calcified leaflets. Difficult to comment on presence or absence of vegetation given heavy calcification, but suspect no vegetation is present. There was severe stenosis. Mean gradient:22mm Hg (S). Peak gradient: 63mm Hg (S). Valve area: 0.5cm^2(VTI). - Aorta: Normal caliber aorta, grade III plaque in arch and descending thoracic aorta. - Mitral valve: Mildly to moderately calcified annulus. Mildly calcified leaflets . No evidence of vegetation. Trivial regurgitation. - Left atrium: The atrium was mildly dilated. No evidence of thrombus in the atrial cavity or appendage. - Right atrium: No evidence of thrombus in the atrial cavity or appendage. - Atrial septum: No defect or patent foramen ovale was identified. Echo contrast study showed no right-to-left atrial level shunt, at baseline or with provocation. - Tricuspid valve: No evidence of vegetation. Peak RV-RA gradient: 27mm Hg (S). - Pulmonic valve: No evidence of vegetation.  Assessment / Plan: 1. Hypotension - not symptomatic but sounds like its low and may be causing issues with his dialysis. He is on Midodrine as well. I have cut the Metoprolol back to just 12.5 mg BID. May have to stop altogether if no improvement.   2. Atrial fib/flutter - on coumadin. Rate  is slow - metoprolol is cut back.   3. Severe AS - was to have had his echo updated. Will try to arrange. I do not think he will be a candidate for surgical intervention. Fortunately, he is not having any cardinal symptoms at this time.   Patient is agreeable to this plan and will call if any problems develop in the interim.   Rosalio Macadamia, RN, ANP-C [facility] [address] [place], [place]  [address]

## 2012-05-07 ENCOUNTER — Ambulatory Visit: Payer: MEDICARE | Admitting: Nurse Practitioner

## 2012-05-09 ENCOUNTER — Ambulatory Visit (INDEPENDENT_AMBULATORY_CARE_PROVIDER_SITE_OTHER): Payer: MEDICARE | Admitting: Cardiology

## 2012-05-09 DIAGNOSIS — Z7901 Long term (current) use of anticoagulants: Secondary | ICD-10-CM

## 2012-05-09 DIAGNOSIS — I4891 Unspecified atrial fibrillation: Secondary | ICD-10-CM

## 2012-05-09 LAB — POCT INR: INR: 2.6

## 2012-05-14 ENCOUNTER — Ambulatory Visit (HOSPITAL_COMMUNITY): Payer: MEDICARE | Attending: Cardiovascular Disease | Admitting: Radiology

## 2012-05-14 DIAGNOSIS — I350 Nonrheumatic aortic (valve) stenosis: Secondary | ICD-10-CM

## 2012-05-14 DIAGNOSIS — I10 Essential (primary) hypertension: Secondary | ICD-10-CM | POA: Insufficient documentation

## 2012-05-14 DIAGNOSIS — E119 Type 2 diabetes mellitus without complications: Secondary | ICD-10-CM | POA: Insufficient documentation

## 2012-05-14 DIAGNOSIS — I359 Nonrheumatic aortic valve disorder, unspecified: Secondary | ICD-10-CM | POA: Insufficient documentation

## 2012-05-14 DIAGNOSIS — I951 Orthostatic hypotension: Secondary | ICD-10-CM | POA: Insufficient documentation

## 2012-05-14 DIAGNOSIS — E785 Hyperlipidemia, unspecified: Secondary | ICD-10-CM | POA: Insufficient documentation

## 2012-05-14 NOTE — Progress Notes (Signed)
Echocardiogram performed.  

## 2012-05-16 ENCOUNTER — Telehealth: Payer: Self-pay | Admitting: Nurse Practitioner

## 2012-05-16 ENCOUNTER — Ambulatory Visit (INDEPENDENT_AMBULATORY_CARE_PROVIDER_SITE_OTHER): Payer: MEDICARE | Admitting: Cardiology

## 2012-05-16 DIAGNOSIS — Z7901 Long term (current) use of anticoagulants: Secondary | ICD-10-CM

## 2012-05-16 DIAGNOSIS — I4891 Unspecified atrial fibrillation: Secondary | ICD-10-CM

## 2012-05-16 NOTE — Telephone Encounter (Signed)
Will forward this information to Dr. Swaziland

## 2012-05-23 ENCOUNTER — Encounter: Payer: Self-pay | Admitting: Internal Medicine

## 2012-05-23 ENCOUNTER — Encounter: Payer: Self-pay | Admitting: Surgery

## 2012-05-23 ENCOUNTER — Ambulatory Visit (INDEPENDENT_AMBULATORY_CARE_PROVIDER_SITE_OTHER): Payer: MEDICARE | Admitting: Internal Medicine

## 2012-05-23 DIAGNOSIS — Z7901 Long term (current) use of anticoagulants: Secondary | ICD-10-CM

## 2012-05-23 DIAGNOSIS — I4891 Unspecified atrial fibrillation: Secondary | ICD-10-CM

## 2012-05-23 NOTE — Progress Notes (Signed)
This encounter was created in error - please disregard.

## 2012-05-26 ENCOUNTER — Ambulatory Visit: Payer: MEDICARE | Admitting: Surgery

## 2012-06-04 ENCOUNTER — Ambulatory Visit (INDEPENDENT_AMBULATORY_CARE_PROVIDER_SITE_OTHER): Payer: MEDICARE | Admitting: Internal Medicine

## 2012-06-04 DIAGNOSIS — I4891 Unspecified atrial fibrillation: Secondary | ICD-10-CM

## 2012-06-04 DIAGNOSIS — Z7901 Long term (current) use of anticoagulants: Secondary | ICD-10-CM

## 2012-06-04 LAB — POCT INR: INR: 2.4

## 2012-06-25 ENCOUNTER — Ambulatory Visit (INDEPENDENT_AMBULATORY_CARE_PROVIDER_SITE_OTHER): Payer: MEDICARE | Admitting: *Deleted

## 2012-06-25 DIAGNOSIS — I4891 Unspecified atrial fibrillation: Secondary | ICD-10-CM

## 2012-06-25 DIAGNOSIS — Z7901 Long term (current) use of anticoagulants: Secondary | ICD-10-CM

## 2012-07-03 ENCOUNTER — Ambulatory Visit (INDEPENDENT_AMBULATORY_CARE_PROVIDER_SITE_OTHER): Payer: MEDICARE | Admitting: Internal Medicine

## 2012-07-03 DIAGNOSIS — I4891 Unspecified atrial fibrillation: Secondary | ICD-10-CM

## 2012-07-03 DIAGNOSIS — Z7901 Long term (current) use of anticoagulants: Secondary | ICD-10-CM

## 2012-07-07 ENCOUNTER — Encounter (INDEPENDENT_AMBULATORY_CARE_PROVIDER_SITE_OTHER): Payer: MEDICARE | Admitting: Ophthalmology

## 2012-07-17 ENCOUNTER — Ambulatory Visit (INDEPENDENT_AMBULATORY_CARE_PROVIDER_SITE_OTHER): Payer: MEDICARE | Admitting: Internal Medicine

## 2012-07-17 DIAGNOSIS — Z7901 Long term (current) use of anticoagulants: Secondary | ICD-10-CM

## 2012-07-17 DIAGNOSIS — I4891 Unspecified atrial fibrillation: Secondary | ICD-10-CM

## 2012-07-31 LAB — PROTIME-INR: INR: 1.9 — AB (ref 0.9–1.1)

## 2012-08-01 ENCOUNTER — Ambulatory Visit (INDEPENDENT_AMBULATORY_CARE_PROVIDER_SITE_OTHER): Payer: MEDICARE | Admitting: Cardiology

## 2012-08-01 DIAGNOSIS — I4891 Unspecified atrial fibrillation: Secondary | ICD-10-CM

## 2012-08-01 DIAGNOSIS — Z7901 Long term (current) use of anticoagulants: Secondary | ICD-10-CM

## 2012-08-11 ENCOUNTER — Other Ambulatory Visit: Payer: Self-pay | Admitting: *Deleted

## 2012-08-12 LAB — PROTIME-INR: INR: 2.8 — AB (ref 0.9–1.1)

## 2012-08-12 MED ORDER — WARFARIN SODIUM 5 MG PO TABS: 5.0000 mg | ORAL_TABLET | ORAL | Status: DC

## 2012-08-14 ENCOUNTER — Ambulatory Visit (INDEPENDENT_AMBULATORY_CARE_PROVIDER_SITE_OTHER): Payer: MEDICARE | Admitting: Cardiovascular Disease

## 2012-08-14 DIAGNOSIS — I4891 Unspecified atrial fibrillation: Secondary | ICD-10-CM

## 2012-08-14 DIAGNOSIS — Z7901 Long term (current) use of anticoagulants: Secondary | ICD-10-CM

## 2012-09-01 ENCOUNTER — Ambulatory Visit (INDEPENDENT_AMBULATORY_CARE_PROVIDER_SITE_OTHER): Payer: MEDICARE | Admitting: Cardiology

## 2012-09-01 DIAGNOSIS — I4891 Unspecified atrial fibrillation: Secondary | ICD-10-CM

## 2012-09-01 DIAGNOSIS — Z7901 Long term (current) use of anticoagulants: Secondary | ICD-10-CM

## 2012-09-29 ENCOUNTER — Ambulatory Visit (INDEPENDENT_AMBULATORY_CARE_PROVIDER_SITE_OTHER): Payer: MEDICARE | Admitting: Pharmacist

## 2012-09-29 DIAGNOSIS — Z7901 Long term (current) use of anticoagulants: Secondary | ICD-10-CM

## 2012-09-29 DIAGNOSIS — I4891 Unspecified atrial fibrillation: Secondary | ICD-10-CM

## 2012-10-09 ENCOUNTER — Emergency Department (HOSPITAL_COMMUNITY): Payer: MEDICARE

## 2012-10-09 ENCOUNTER — Encounter (HOSPITAL_COMMUNITY): Payer: Self-pay | Admitting: Emergency Medicine

## 2012-10-09 ENCOUNTER — Inpatient Hospital Stay (HOSPITAL_COMMUNITY)
Admission: EM | Admit: 2012-10-09 | Discharge: 2012-10-15 | DRG: 306 | Disposition: A | Discharge status: Home Health | Payer: MEDICARE | Attending: Internal Medicine | Admitting: Internal Medicine

## 2012-10-09 DIAGNOSIS — Z993 Dependence on wheelchair: Secondary | ICD-10-CM

## 2012-10-09 DIAGNOSIS — Z8546 Personal history of malignant neoplasm of prostate: Secondary | ICD-10-CM

## 2012-10-09 DIAGNOSIS — E119 Type 2 diabetes mellitus without complications: Secondary | ICD-10-CM | POA: Diagnosis present

## 2012-10-09 DIAGNOSIS — I4891 Unspecified atrial fibrillation: Secondary | ICD-10-CM | POA: Diagnosis present

## 2012-10-09 DIAGNOSIS — Z951 Presence of aortocoronary bypass graft: Secondary | ICD-10-CM

## 2012-10-09 DIAGNOSIS — K5900 Constipation, unspecified: Secondary | ICD-10-CM | POA: Diagnosis present

## 2012-10-09 DIAGNOSIS — I350 Nonrheumatic aortic (valve) stenosis: Secondary | ICD-10-CM | POA: Diagnosis present

## 2012-10-09 DIAGNOSIS — I953 Hypotension of hemodialysis: Secondary | ICD-10-CM | POA: Diagnosis present

## 2012-10-09 DIAGNOSIS — Z833 Family history of diabetes mellitus: Secondary | ICD-10-CM

## 2012-10-09 DIAGNOSIS — Z8249 Family history of ischemic heart disease and other diseases of the circulatory system: Secondary | ICD-10-CM

## 2012-10-09 DIAGNOSIS — M109 Gout, unspecified: Secondary | ICD-10-CM | POA: Diagnosis present

## 2012-10-09 DIAGNOSIS — E11319 Type 2 diabetes mellitus with unspecified diabetic retinopathy without macular edema: Secondary | ICD-10-CM | POA: Diagnosis present

## 2012-10-09 DIAGNOSIS — M129 Arthropathy, unspecified: Secondary | ICD-10-CM | POA: Diagnosis present

## 2012-10-09 DIAGNOSIS — R5381 Other malaise: Secondary | ICD-10-CM | POA: Diagnosis present

## 2012-10-09 DIAGNOSIS — E1149 Type 2 diabetes mellitus with other diabetic neurological complication: Secondary | ICD-10-CM | POA: Diagnosis present

## 2012-10-09 DIAGNOSIS — Z992 Dependence on renal dialysis: Secondary | ICD-10-CM

## 2012-10-09 DIAGNOSIS — I739 Peripheral vascular disease, unspecified: Secondary | ICD-10-CM | POA: Diagnosis present

## 2012-10-09 DIAGNOSIS — I959 Hypotension, unspecified: Secondary | ICD-10-CM | POA: Diagnosis present

## 2012-10-09 DIAGNOSIS — E1142 Type 2 diabetes mellitus with diabetic polyneuropathy: Secondary | ICD-10-CM | POA: Diagnosis present

## 2012-10-09 DIAGNOSIS — R4182 Altered mental status, unspecified: Secondary | ICD-10-CM | POA: Diagnosis not present

## 2012-10-09 DIAGNOSIS — I359 Nonrheumatic aortic valve disorder, unspecified: Principal | ICD-10-CM | POA: Diagnosis present

## 2012-10-09 DIAGNOSIS — M899 Disorder of bone, unspecified: Secondary | ICD-10-CM | POA: Diagnosis present

## 2012-10-09 DIAGNOSIS — N186 End stage renal disease: Secondary | ICD-10-CM | POA: Diagnosis present

## 2012-10-09 DIAGNOSIS — Z6827 Body mass index (BMI) 27.0-27.9, adult: Secondary | ICD-10-CM

## 2012-10-09 DIAGNOSIS — E1139 Type 2 diabetes mellitus with other diabetic ophthalmic complication: Secondary | ICD-10-CM | POA: Diagnosis present

## 2012-10-09 DIAGNOSIS — Z7901 Long term (current) use of anticoagulants: Secondary | ICD-10-CM

## 2012-10-09 DIAGNOSIS — I951 Orthostatic hypotension: Secondary | ICD-10-CM | POA: Diagnosis present

## 2012-10-09 DIAGNOSIS — D638 Anemia in other chronic diseases classified elsewhere: Secondary | ICD-10-CM | POA: Diagnosis present

## 2012-10-09 DIAGNOSIS — I252 Old myocardial infarction: Secondary | ICD-10-CM

## 2012-10-09 DIAGNOSIS — E785 Hyperlipidemia, unspecified: Secondary | ICD-10-CM | POA: Diagnosis present

## 2012-10-09 DIAGNOSIS — I120 Hypertensive chronic kidney disease with stage 5 chronic kidney disease or end stage renal disease: Secondary | ICD-10-CM | POA: Diagnosis present

## 2012-10-09 DIAGNOSIS — Y841 Kidney dialysis as the cause of abnormal reaction of the patient, or of later complication, without mention of misadventure at the time of the procedure: Secondary | ICD-10-CM | POA: Diagnosis present

## 2012-10-09 DIAGNOSIS — N2581 Secondary hyperparathyroidism of renal origin: Secondary | ICD-10-CM | POA: Diagnosis present

## 2012-10-09 DIAGNOSIS — Z87891 Personal history of nicotine dependence: Secondary | ICD-10-CM

## 2012-10-09 DIAGNOSIS — I70269 Atherosclerosis of native arteries of extremities with gangrene, unspecified extremity: Secondary | ICD-10-CM

## 2012-10-09 DIAGNOSIS — Z794 Long term (current) use of insulin: Secondary | ICD-10-CM

## 2012-10-09 DIAGNOSIS — Z87442 Personal history of urinary calculi: Secondary | ICD-10-CM

## 2012-10-09 DIAGNOSIS — R55 Syncope and collapse: Secondary | ICD-10-CM | POA: Diagnosis present

## 2012-10-09 DIAGNOSIS — R791 Abnormal coagulation profile: Secondary | ICD-10-CM | POA: Diagnosis present

## 2012-10-09 DIAGNOSIS — Z823 Family history of stroke: Secondary | ICD-10-CM

## 2012-10-09 DIAGNOSIS — I4892 Unspecified atrial flutter: Secondary | ICD-10-CM | POA: Diagnosis present

## 2012-10-09 DIAGNOSIS — I2510 Atherosclerotic heart disease of native coronary artery without angina pectoris: Secondary | ICD-10-CM | POA: Diagnosis present

## 2012-10-09 DIAGNOSIS — S88119A Complete traumatic amputation at level between knee and ankle, unspecified lower leg, initial encounter: Secondary | ICD-10-CM

## 2012-10-09 DIAGNOSIS — Z515 Encounter for palliative care: Secondary | ICD-10-CM

## 2012-10-09 HISTORY — DX: Nonrheumatic aortic (valve) stenosis: I35.0

## 2012-10-09 HISTORY — DX: End stage renal disease: N18.6

## 2012-10-09 HISTORY — DX: Anemia in other chronic diseases classified elsewhere: D63.8

## 2012-10-09 HISTORY — DX: Orthostatic hypotension: I95.1

## 2012-10-09 LAB — BASIC METABOLIC PANEL
BUN: 25 mg/dL — ABNORMAL HIGH (ref 6–23)
Calcium: 9.9 mg/dL (ref 8.4–10.5)
Creatinine, Ser: 6 mg/dL — ABNORMAL HIGH (ref 0.50–1.35)
GFR calc Af Amer: 9 mL/min — ABNORMAL LOW (ref >90)
GFR calc non Af Amer: 8 mL/min — ABNORMAL LOW (ref >90)

## 2012-10-09 LAB — CBC WITH DIFFERENTIAL/PLATELET
Basophils Relative: 0 % (ref 0–1)
Eosinophils Absolute: 0.1 10*3/uL (ref 0.0–0.7)
Eosinophils Relative: 1 % (ref 0–5)
HCT: 46.2 % (ref 39.0–52.0)
Hemoglobin: 15.9 g/dL (ref 13.0–17.0)
MCH: 32.4 pg (ref 26.0–34.0)
MCHC: 34.4 g/dL (ref 30.0–36.0)
MCV: 94.1 fL (ref 78.0–100.0)
Monocytes Absolute: 0.6 10*3/uL (ref 0.1–1.0)
Monocytes Relative: 8 % (ref 3–12)
WBC: 7.8 10*3/uL (ref 4.0–10.5)

## 2012-10-09 LAB — GLUCOSE, CAPILLARY: Glucose-Capillary: 271 mg/dL — ABNORMAL HIGH (ref 70–99)

## 2012-10-09 MED ORDER — MIDODRINE HCL 5 MG PO TABS
10.0000 mg | ORAL_TABLET | ORAL | Status: DC
  Administered 2012-10-11 – 2012-10-14 (×2): 10 mg via ORAL
  Filled 2012-10-09 (×2): qty 2

## 2012-10-09 MED ORDER — CALCIUM CARBONATE ANTACID 500 MG PO CHEW
1.0000 | CHEWABLE_TABLET | Freq: Three times a day (TID) | ORAL | Status: DC
  Administered 2012-10-10 – 2012-10-15 (×16): 200 mg via ORAL
  Filled 2012-10-09 (×19): qty 1

## 2012-10-09 MED ORDER — SODIUM CHLORIDE 0.9 % IV BOLUS (SEPSIS)
250.0000 mL | Freq: Once | INTRAVENOUS | Status: AC
  Administered 2012-10-09: 250 mL via INTRAVENOUS

## 2012-10-09 MED ORDER — METOPROLOL TARTRATE 25 MG PO TABS
37.5000 mg | ORAL_TABLET | Freq: Two times a day (BID) | ORAL | Status: DC
  Administered 2012-10-09 – 2012-10-10 (×2): 37.5 mg via ORAL
  Filled 2012-10-09 (×3): qty 1.5

## 2012-10-09 MED ORDER — INSULIN ASPART 100 UNIT/ML ~~LOC~~ SOLN
0.0000 [IU] | Freq: Three times a day (TID) | SUBCUTANEOUS | Status: DC
  Administered 2012-10-10: 18:00:00 1 [IU] via SUBCUTANEOUS
  Administered 2012-10-11 – 2012-10-12 (×2): 2 [IU] via SUBCUTANEOUS
  Administered 2012-10-12: 18:00:00 1 [IU] via SUBCUTANEOUS
  Administered 2012-10-12 – 2012-10-13 (×2): 2 [IU] via SUBCUTANEOUS
  Administered 2012-10-13 – 2012-10-14 (×2): 1 [IU] via SUBCUTANEOUS
  Administered 2012-10-15 (×2): 2 [IU] via SUBCUTANEOUS

## 2012-10-09 MED ORDER — WARFARIN - PHARMACIST DOSING INPATIENT: Freq: Every day | Status: DC

## 2012-10-09 MED ORDER — RENA-VITE PO TABS
1.0000 | ORAL_TABLET | Freq: Every day | ORAL | Status: DC
  Administered 2012-10-09 – 2012-10-14 (×6): 1 via ORAL
  Filled 2012-10-09 (×9): qty 1

## 2012-10-09 MED ORDER — SIMVASTATIN 40 MG PO TABS
40.0000 mg | ORAL_TABLET | Freq: Every day | ORAL | Status: DC
  Administered 2012-10-09 – 2012-10-14 (×6): 40 mg via ORAL
  Filled 2012-10-09 (×8): qty 1

## 2012-10-09 MED ORDER — ASPIRIN EC 81 MG PO TBEC
81.0000 mg | DELAYED_RELEASE_TABLET | Freq: Every day | ORAL | Status: DC
  Administered 2012-10-10 – 2012-10-15 (×6): 81 mg via ORAL
  Filled 2012-10-09 (×6): qty 1

## 2012-10-09 MED ORDER — INSULIN ASPART 100 UNIT/ML ~~LOC~~ SOLN
0.0000 [IU] | Freq: Every day | SUBCUTANEOUS | Status: DC
  Administered 2012-10-09 – 2012-10-14 (×2): 3 [IU] via SUBCUTANEOUS

## 2012-10-09 MED ORDER — GLIMEPIRIDE 2 MG PO TABS
2.0000 mg | ORAL_TABLET | Freq: Every day | ORAL | Status: DC
  Administered 2012-10-10 – 2012-10-15 (×6): 2 mg via ORAL
  Filled 2012-10-09 (×7): qty 1

## 2012-10-09 MED ORDER — SODIUM CHLORIDE 0.9 % IV SOLN: 250.0000 mL | INTRAVENOUS | Status: DC | PRN

## 2012-10-09 MED ORDER — SODIUM CHLORIDE 0.9 % IJ SOLN
3.0000 mL | Freq: Two times a day (BID) | INTRAMUSCULAR | Status: DC
  Administered 2012-10-09 – 2012-10-14 (×7): 3 mL via INTRAVENOUS

## 2012-10-09 MED ORDER — SODIUM CHLORIDE 0.9 % IJ SOLN
3.0000 mL | INTRAMUSCULAR | Status: DC | PRN
  Administered 2012-10-10: 09:00:00 3 mL via INTRAVENOUS

## 2012-10-09 MED ORDER — SODIUM CHLORIDE 0.9 % IJ SOLN
3.0000 mL | Freq: Two times a day (BID) | INTRAMUSCULAR | Status: DC
  Administered 2012-10-09 – 2012-10-15 (×10): 3 mL via INTRAVENOUS

## 2012-10-09 MED ORDER — ALLOPURINOL 150 MG HALF TABLET
150.0000 mg | ORAL_TABLET | Freq: Every day | ORAL | Status: DC
  Administered 2012-10-10 – 2012-10-15 (×6): 150 mg via ORAL
  Filled 2012-10-09 (×6): qty 1

## 2012-10-09 MED ORDER — WARFARIN SODIUM 5 MG PO TABS
5.0000 mg | ORAL_TABLET | Freq: Once | ORAL | Status: AC
  Administered 2012-10-09: 5 mg via ORAL
  Filled 2012-10-09: qty 1

## 2012-10-09 NOTE — Progress Notes (Signed)
ANTICOAGULATION CONSULT NOTE - Initial Consult  Pharmacy Consult for coumadin Indication: atrial fibrillation  Allergies  Allergen Reactions  . Ancef [Cefazolin] Rash    Patient cant recall if allergy to this med exists.     Patient Measurements: Height: 6\' 1"  (185.4 cm) (with bilateral prosthesis) Weight: 202 lb 13.2 oz (92 kg) IBW/kg (Calculated) : 79.9  Vital Signs: Temp: 97.3 F (36.3 C) (10/02 2223) Temp src: Oral (10/02 2223) BP: 93/47 mmHg (10/02 2223) Pulse Rate: 67 (10/02 2223)  Labs:  Recent Labs  10/09/12 1845  HGB 15.9  HCT 46.2  PLT 168  LABPROT 19.6*  INR 1.71*  CREATININE 6.00*  TROPONINI <0.30    Estimated Creatinine Clearance: 11.5 ml/min (by C-G formula based on Cr of 6).   Medical History: Past Medical History  Diagnosis Date  . Diabetes mellitus   . Gout   . Hypertension   . Hyperlipidemia   . Arthritis   . Prostate cancer     s/p seed implant  . Cataract     BILATERAL-BEEN REMOVED  . Neuromuscular disorder     DABETIC NEUROPATHY-LOWER EXTREMITY  . Atrial fibrillation     on coumadin  . Aortic stenosis     moderate to severe, not felt to be a surgical candidate  . Anemia     secondary to end stage renal disease  . Coronary artery disease     remote CABG in 1996; last stress test in 2005; last cath 2005  . Diabetic neuropathy   . Edema   . PAD (peripheral artery disease)   . Diabetic retinopathy   . Chronic anticoagulation     on coumadin  . Shortness of breath   . Chronic kidney disease     Started dialysis October 2012  . Nephrolithiasis     prior history of percutaneous nephrostomy  . Myocardial infarction     1999 ish    Medications:  Prescriptions prior to admission  Medication Sig Dispense Refill  . allopurinol (ZYLOPRIM) 300 MG tablet Take 0.5 tablets (150 mg total) by mouth daily.  30 tablet  1  . aspirin EC 81 MG tablet Take 1 tablet (81 mg total) by mouth daily.      . calcium carbonate (TUMS - DOSED IN MG  ELEMENTAL CALCIUM) 500 MG chewable tablet Chew 1 tablet by mouth 3 (three) times daily with meals. Phosphorous binder      . estazolam (PROSOM) 2 MG tablet Take 1 mg by mouth at bedtime as needed (sleep).      [name] glimepiride (AMARYL) 4 MG tablet Take 2 mg by mouth daily before breakfast.      . ibuprofen (ADVIL,MOTRIN) 200 MG tablet Take 200 mg by mouth every 6 (six) hours as needed. For pain      . metoprolol tartrate (LOPRESSOR) 25 MG tablet Take 1.5 mg by mouth 2 (two) times daily.      . midodrine (PROAMATINE) 10 MG tablet Take 10 mg by mouth See admin instructions. Take 1 tablet by mouth 1 hour prior to dialysis      . multivitamin (RENA-VIT) TABS tablet Take 1 tablet by mouth daily.      . simvastatin (ZOCOR) 40 MG tablet Take 1 tablet (40 mg total) by mouth at bedtime.  30 tablet  1  . warfarin (COUMADIN) 5 MG tablet Take 1 tablet (5 mg total) by mouth as directed. Take As Directed by Anticoagulation Clinic  30 tablet  3   Scheduled:  [name] [  START ON 10/10/2012] allopurinol  150 mg Oral Daily  . [START ON 10/10/2012] aspirin EC  81 mg Oral Daily  . [START ON 10/10/2012] calcium carbonate  1 tablet Oral TID WC  . [START ON 10/10/2012] glimepiride  2 mg Oral QAC breakfast  . insulin aspart  0-5 Units Subcutaneous QHS  . [START ON 10/10/2012] insulin aspart  0-9 Units Subcutaneous TID WC  . metoprolol tartrate  37.5 mg Oral BID  . [START ON 10/11/2012] midodrine  10 mg Oral Q T,Th,Sa-HD  . multivitamin  1 tablet Oral QHS  . simvastatin  40 mg Oral QHS  . sodium chloride  3 mL Intravenous Q12H  . sodium chloride  3 mL Intravenous Q12H    Assessment: 79 yo male here with syncopal episode. Patient on coumadin for afib and pharmacy has been asked to manage his regimen. INR today= 1.71.  Home coumadin regimen: 5mg /day except 2.5mg  on Wed (last dose 10/08/2012)  Goal of Therapy:  INR 2-3 Monitor platelets by anticoagulation protocol: Yes   Plan:  -Coumadin 5mg  po today -Daily PT/INR  Harland German, Pharm D 10/09/2012 10:50 PM

## 2012-10-09 NOTE — ED Notes (Signed)
Portable chest xray at bedside.

## 2012-10-09 NOTE — H&P (Signed)
[facility] History and Physical  SHAQUELLE HERNON AVW:[id number] DOB: [birth year]    PCP:   Junious Silk, MD   Chief Complaint: syncope.  HPI: Alan [name] is an 79 y.o. male with hx of afib on Coumadin, ESRD on HD (T, Thrs, Sat), hx of moderate AS (not a surgical candidate), known orthostatic symptomology on Midodrine, HTN, hyperlipidemia, his of bilateral amputations, received dialysis today uneventfully, but had a syncope as he was helping wheeling himself up the ramp of his house. He didn't have any chest pain, HA, palpitation, Sz activities, bowel or bladder incontinence.  His wife said he was out for only a minute or two.  Evaluation in the ER showed normal WBC, Hb, Cr of 6.0, K of 3.5, and negative troponin.  His EKG showed no acute ST-T changes, in aflutter.  He is asymptomatic when I saw him.  Rewiew of Systems:  Constitutional: Negative for malaise, fever and chills. No significant weight loss or weight gain Eyes: Negative for eye pain, redness and discharge, diplopia, visual changes, or flashes of light. ENMT: Negative for ear pain, hoarseness, nasal congestion, sinus pressure and sore throat. No headaches; tinnitus, drooling, or problem swallowing. Cardiovascular: Negative for chest pain, palpitations, diaphoresis, dyspnea and peripheral edema. ; No orthopnea, PND Respiratory: Negative for cough, hemoptysis, wheezing and stridor. No pleuritic chestpain. Gastrointestinal: Negative for nausea, vomiting, diarrhea, constipation, abdominal pain, melena, blood in stool, hematemesis, jaundice and rectal bleeding.    Genitourinary: Negative for frequency, dysuria, incontinence,flank pain and hematuria; Musculoskeletal: Negative for back pain and neck pain. Negative for swelling and trauma.;  Skin: . Negative for pruritus, rash, abrasions, bruising and skin lesion.; ulcerations Neuro: Negative for headache, and neck stiffness. Negative for weakness, , altered mental  status, extremity weakness, burning feet, involuntary movement, seizure and syncope.  Psych: negative for anxiety, depression, insomnia, tearfulness, panic attacks, hallucinations, paranoia, suicidal or homicidal ideation    Past Medical History  Diagnosis Date  . Diabetes mellitus   . Gout   . Hypertension   . Hyperlipidemia   . Arthritis   . Prostate cancer     s/p seed implant  . Cataract     BILATERAL-BEEN REMOVED  . Neuromuscular disorder     DABETIC NEUROPATHY-LOWER EXTREMITY  . Atrial fibrillation     on coumadin  . Aortic stenosis     moderate to severe, not felt to be a surgical candidate  . Anemia     secondary to end stage renal disease  . Coronary artery disease     remote CABG in 1996; last stress test in 2005; last cath 2005  . Diabetic neuropathy   . Edema   . PAD (peripheral artery disease)   . Diabetic retinopathy   . Chronic anticoagulation     on coumadin  . Shortness of breath   . Chronic kidney disease     Started dialysis October 2012  . Nephrolithiasis     prior history of percutaneous nephrostomy  . Myocardial infarction     1999 ish    Past Surgical History  Procedure Laterality Date  . Ventral hernia repair    . Inguinal hernia repair      right side  . Colonoscopy    . Kidney stone surgery    . Polypectomy    . Tonsillectomy    . Toe surgery      removal little toe right foot  . Cardiac catheterization  04/19/2003    SEVERE 2 VESSEL OBSTRUCTIVE ATHERSCLEROTIC  CAD  . Coronary artery bypass graft  1996    LIMA GRAFT TO THE LAD, SEQUENTIAL SAPHENOUS  VEIN GRAFT TO THE FIRST AND SECOND DIAGONIAL BRANCHES, SEQUENTIAL SAPHENOUS VEIN GRAFT TO THE ACUTE MARGINAL, POSTERIOR DESCENDING, AND POSTERIOR LATERAL BRANCHES OF THE RIGHT CORONARY ARTERY  . Radioactive seed implant    . US echocardiography  09/06/2009    EF 55-60%  . Cardiovascular stress test  04/13/2003    EF 54%. EVIDENCE OF ANTERO-APICAL ISCHEMIA. NORMAL LV SIZE AND FUNCTION  . Tee  without cardioversion  05/09/2011    Procedure: TRANSESOPHAGEAL ECHOCARDIOGRAM (TEE);  Surgeon: Laurey Morale, MD;  Location: [facility] ENDOSCOPY;  Service: Cardiovascular;  Laterality: N/A;  . Hernia repair    . Amputation  07/20/2011    Procedure: AMPUTATION BELOW KNEE;  Surgeon: Nada Libman, MD;  Location: [facility] OR;  Service: Vascular;  Laterality: Left;  . Amputation  01/25/2012    Procedure: AMPUTATION DIGIT;  Surgeon: Chuck Hint, MD;  Location: [facility] OR;  Service: Vascular;  Laterality: Right;  second  . Av fistula placement      right  . Transmetatarsal amputation Right 02/15/2012    Procedure: TRANSMETATARSAL AMPUTATION;  Surgeon: Nada Libman, MD;  Location: [facility] OR;  Service: Vascular;  Laterality: Right;  . Amputation Right 02/29/2012    Procedure: AMPUTATION BELOW KNEE;  Surgeon: Nada Libman, MD;  Location: [facility] OR;  Service: Vascular;  Laterality: Right;  Right BKA    Medications:  HOME MEDS: Prior to Admission medications   Medication Sig Start Date End Date Taking? Authorizing Provider  allopurinol (ZYLOPRIM) 300 MG tablet Take 0.5 tablets (150 mg total) by mouth daily. 02/06/12  Yes Daniel J Angiulli, PA-C  aspirin EC 81 MG tablet Take 1 tablet (81 mg total) by mouth daily. 02/06/12  Yes Daniel J Angiulli, PA-C  calcium carbonate (TUMS - DOSED IN MG ELEMENTAL CALCIUM) 500 MG chewable tablet Chew 1 tablet by mouth 3 (three) times daily with meals. Phosphorous binder   Yes Historical Provider, MD  estazolam (PROSOM) 2 MG tablet Take 1 mg by mouth at bedtime as needed (sleep).   Yes Historical Provider, MD  glimepiride (AMARYL) 4 MG tablet Take 2 mg by mouth daily before breakfast.   Yes Historical Provider, MD  ibuprofen (ADVIL,MOTRIN) 200 MG tablet Take 200 mg by mouth every 6 (six) hours as needed. For pain   Yes Historical Provider, MD  metoprolol tartrate (LOPRESSOR) 25 MG tablet Take 1.5 mg by mouth 2 (two) times daily.   Yes Historical Provider, MD  midodrine (PROAMATINE)  10 MG tablet Take 10 mg by mouth See admin instructions. Take 1 tablet by mouth 1 hour prior to dialysis 04/15/12  Yes Historical Provider, MD  multivitamin (RENA-VIT) TABS tablet Take 1 tablet by mouth daily.   Yes Historical Provider, MD  simvastatin (ZOCOR) 40 MG tablet Take 1 tablet (40 mg total) by mouth at bedtime. 02/06/12  Yes Daniel J Angiulli, PA-C  warfarin (COUMADIN) 5 MG tablet Take 1 tablet (5 mg total) by mouth as directed. Take As Directed by Anticoagulation Clinic 08/11/12  Yes Yazleen Molock M [place], MD     Allergies:  Allergies  Allergen Reactions  . Ancef [Cefazolin] Rash    Patient cant recall if allergy to this med exists.     Social History:   reports that he quit smoking about 44 years ago. His smoking use included Cigarettes. He smoked 0.00 packs per day for 2 years. He has quit  using smokeless tobacco. His smokeless tobacco use included Chew. He reports that he does not drink alcohol or use illicit drugs.  Family History: Family History  Problem Relation Age of Onset  . Heart disease Father   . Hypertension Brother   . Stroke Brother   . Diabetes Mother     Pt. not sure, but he thinks she did.     Physical Exam: Filed Vitals:   10/09/12 1950 10/09/12 2030 10/09/12 2045 10/09/12 2100  BP: 68/53 101/51 113/58 123/66  Pulse: 120     Temp:      TempSrc:      Resp:  15 22 16   SpO2:       Blood pressure 123/66, pulse 120, temperature 97.5 F (36.4 C), temperature source Oral, resp. rate 16, SpO2 97.00%.  GEN:  Pleasant  patient lying in the stretcher in no acute distress; cooperative with exam. PSYCH:  alert and oriented x4; does not appear anxious or depressed; affect is appropriate. HEENT: Mucous membranes pink and anicteric; PERRLA; EOM intact; no cervical lymphadenopathy nor thyromegaly or carotid bruit; no JVD; There were no stridor. Neck is very supple. Breasts:: Not examined CHEST WALL: No tenderness CHEST: Normal respiration, clear to auscultation  bilaterally.  HEART: Iregular rate and rhythm.  There  Is a Cres-decres LSB 3/6, late in the cycle, but with audible S2.[name]   No rub. BACK: No kyphosis or scoliosis; no CVA tenderness ABDOMEN: soft and non-tender; no masses, no organomegaly, normal abdominal bowel sounds; no pannus; no intertriginous candida. There is no rebound and no distention. Rectal Exam: Not done EXTREMITIES: No bone or joint deformity; age-appropriate arthropathy of the hands and knees; no edema; no ulcerations.  There is no calf tenderness. AVF on his right arm. Genitalia: not examined PULSES: 2+ and symmetric SKIN: Normal hydration no rash or ulceration CNS: Cranial nerves 2-12 grossly intact no focal lateralizing neurologic deficit.  Speech is fluent; uvula elevated with phonation, facial symmetry and tongue midline. DTR are normal bilaterally, cerebella exam is intact, barbinski is negative and strengths are equaled bilaterally.  No sensory loss.   Labs on Admission:  Basic Metabolic Panel:  Recent Labs Lab 10/09/12 1845  NA 145  K 3.5  CL 99  CO2 29  GLUCOSE 213*  BUN 25*  CREATININE 6.00*  CALCIUM 9.9   Liver Function Tests: No results found for this basename: AST, ALT, ALKPHOS, BILITOT, PROT, ALBUMIN,  in the last 168 hours No results found for this basename: LIPASE, AMYLASE,  in the last 168 hours No results found for this basename: AMMONIA,  in the last 168 hours CBC:  Recent Labs Lab 10/09/12 1845  WBC 7.8  NEUTROABS 5.4  HGB 15.9  HCT 46.2  MCV 94.1  PLT 168   Cardiac Enzymes:  Recent Labs Lab 10/09/12 1845  TROPONINI <0.30    CBG: No results found for this basename: GLUCAP,  in the last 168 hours   Radiological Exams on Admission: Dg Chest Port 1 View  10/09/2012   *RADIOLOGY REPORT*  Clinical Data: Syncope after dialysis  PORTABLE CHEST - 1 VIEW  Comparison: 01/23/2012  Findings: Heart size within normal limits.  Vascular pattern normal.  Lungs clear except for mild lower lobe  atelectasis on the left.  The patient noted to be status post CABG.  IMPRESSION: No acute abnormalities   Original Report Authenticated By: Esperanza Heir, M.D.    EKG: Independently reviewed. Aflutter with controlled rate. No ischemic changes.  Assessment/Plan Present on  Admission:  . Syncope and collapse . Aortic stenosis . Atrial fibrillation . CAD (coronary artery disease) . Diabetes mellitus . Hypotension . Orthostatic hypotension  PLAN:  This gentleman has a syncopal episode.  I suspect it is volume depletion from dialysis today.  He also has hx of moderate AS, afib, orthostasis, and therefore, it is very important to admit him for obs.  He should get just a little bit of IVF because he indeed has AS, but not too much because he is on dialysis.  I will continue all his meds, place him on monitor, continue with his coumadin, and rule him out.  For his DM, will continue his meds, and place him on carb modified diet with SSI.  He is stable, but it is difficult to get his BP just right, as he also has orthostatic tendency and with moderate AS.  He is not a candidate for surgery.  He is a full code (confirmed tonight), and will be admitted to telemetry under [facility] service.  Thank you for allowing me to participate in his care. Other plans as per orders.  Code Status: FULL Unk Lightning, MD. [facility] Pager [phone] 7pm to 7am.  10/09/2012, 10:05 PM

## 2012-10-09 NOTE — ED Notes (Signed)
Pt arrived from home by [facility] with c/o syncopal episode. Pt had dialysis today and normally does 4hrs and but today it lasted 5 hrs. Pt wife was pushing pt in w/c up to house after dialysis and stated that pt just "went out". EMS arrived and pt was alert, diaphoretic and vomit x 1. Pt stated that his BP was 90/40 when he left dialysis. CBG 219 EMS admin Zofran 4mg  . EKG noted Aflutter. Pt still c/o generalized weakness and nausea. BP-114/60 HR-84

## 2012-10-09 NOTE — ED Notes (Signed)
Dr. Conley Rolls admitting physician at bedside.

## 2012-10-09 NOTE — ED Notes (Signed)
Unable to finish Orthostatic vitals. Patient weak and unsteady. Bilateral amputee.

## 2012-10-09 NOTE — ED Notes (Signed)
IV EMS placed came out d/t pt being cold and diaphoretic. New IV placed.

## 2012-10-09 NOTE — ED Provider Notes (Signed)
CSN: [id number]     Arrival date & time 10/09/12  1834 History   First MD Initiated Contact with Patient 10/09/12 1837     Chief Complaint  Patient presents with  . Loss of Consciousness   (Consider location/radiation/quality/duration/timing/severity/associated sxs/prior Treatment) Patient is a 79 y.o. male presenting with syncope.  Loss of Consciousness Episode history:  Single Most recent episode:  Today Duration:  5 minutes Timing: once, sudden. Progression:  Resolved Context: exertion (helping roll himself in his wheelchair up a ramp.  )   Witnessed: yes   Relieved by:  Nothing Associated symptoms: diaphoresis and vomiting   Associated symptoms: no chest pain, no fever, no focal weakness, no nausea and no shortness of breath     Past Medical History  Diagnosis Date  . Diabetes mellitus   . Gout   . Hypertension   . Hyperlipidemia   . Arthritis   . Prostate cancer     s/p seed implant  . Cataract     BILATERAL-BEEN REMOVED  . Neuromuscular disorder     DABETIC NEUROPATHY-LOWER EXTREMITY  . Atrial fibrillation     on coumadin  . Aortic stenosis     moderate to severe, not felt to be a surgical candidate  . Anemia     secondary to end stage renal disease  . Coronary artery disease     remote CABG in 1996; last stress test in 2005; last cath 2005  . Diabetic neuropathy   . Edema   . PAD (peripheral artery disease)   . Diabetic retinopathy   . Chronic anticoagulation     on coumadin  . Shortness of breath   . Chronic kidney disease     Started dialysis October 2012  . Nephrolithiasis     prior history of percutaneous nephrostomy  . Myocardial infarction     1999 ish   Past Surgical History  Procedure Laterality Date  . Ventral hernia repair    . Inguinal hernia repair      right side  . Colonoscopy    . Kidney stone surgery    . Polypectomy    . Tonsillectomy    . Toe surgery      removal little toe right foot  . Cardiac catheterization  04/19/2003     SEVERE 2 VESSEL OBSTRUCTIVE ATHERSCLEROTIC CAD  . Coronary artery bypass graft  1996    LIMA GRAFT TO THE LAD, SEQUENTIAL SAPHENOUS  VEIN GRAFT TO THE FIRST AND SECOND DIAGONIAL BRANCHES, SEQUENTIAL SAPHENOUS VEIN GRAFT TO THE ACUTE MARGINAL, POSTERIOR DESCENDING, AND POSTERIOR LATERAL BRANCHES OF THE RIGHT CORONARY ARTERY  . Radioactive seed implant    . US echocardiography  09/06/2009    EF 55-60%  . Cardiovascular stress test  04/13/2003    EF 54%. EVIDENCE OF ANTERO-APICAL ISCHEMIA. NORMAL LV SIZE AND FUNCTION  . Tee without cardioversion  05/09/2011    Procedure: TRANSESOPHAGEAL ECHOCARDIOGRAM (TEE);  Surgeon: Laurey Morale, MD;  Location: [facility] ENDOSCOPY;  Service: Cardiovascular;  Laterality: N/A;  . Hernia repair    . Amputation  07/20/2011    Procedure: AMPUTATION BELOW KNEE;  Surgeon: Nada Libman, MD;  Location: [facility] OR;  Service: Vascular;  Laterality: Left;  . Amputation  01/25/2012    Procedure: AMPUTATION DIGIT;  Surgeon: Chuck Hint, MD;  Location: [facility] OR;  Service: Vascular;  Laterality: Right;  second  . Av fistula placement      right  . Transmetatarsal amputation Right 02/15/2012    Procedure:  TRANSMETATARSAL AMPUTATION;  Surgeon: Nada Libman, MD;  Location: [facility] OR;  Service: Vascular;  Laterality: Right;  . Amputation Right 02/29/2012    Procedure: AMPUTATION BELOW KNEE;  Surgeon: Nada Libman, MD;  Location: [facility] OR;  Service: Vascular;  Laterality: Right;  Right BKA   Family History  Problem Relation Age of Onset  . Heart disease Father   . Hypertension Brother   . Stroke Brother   . Diabetes Mother     Pt. not sure, but he thinks she did.   History  Substance Use Topics  . Smoking status: Former Smoker -- 2 years    Types: Cigarettes    Quit date: 04/10/1968  . Smokeless tobacco: Former Neurosurgeon    Types: Chew  . Alcohol Use: No    Review of Systems  Constitutional: Positive for diaphoresis. Negative for fever.  HENT: Negative for congestion.    Respiratory: Negative for cough and shortness of breath.   Cardiovascular: Positive for syncope. Negative for chest pain.  Gastrointestinal: Positive for vomiting. Negative for nausea, abdominal pain and diarrhea.  Neurological: Negative for focal weakness.  All other systems reviewed and are negative.    Allergies  Ancef  Home Medications   Current Outpatient Rx  Name  Route  Sig  Dispense  Refill  . acetaminophen (TYLENOL) 500 MG tablet   Oral   Take 1,000 mg by mouth every 8 (eight) hours as needed for pain.         [name] allopurinol (ZYLOPRIM) 300 MG tablet   Oral   Take 0.5 tablets (150 mg total) by mouth daily.   30 tablet   1   . aspirin EC 81 MG tablet   Oral   Take 1 tablet (81 mg total) by mouth daily.         . calcium carbonate (TUMS - DOSED IN MG ELEMENTAL CALCIUM) 500 MG chewable tablet   Oral   Chew 1 tablet by mouth 3 (three) times daily with meals. Phosphorous binder         . clindamycin (CLEOCIN) 300 MG capsule   Oral   Take 1 capsule (300 mg total) by mouth 3 (three) times daily.   30 capsule   0   . collagenase (SANTYL) ointment      Apply small amt. to wound of right stump incision daily, and cover with dry gauze dressing   30 g   1   . doxycycline (VIBRA-TABS) 100 MG tablet   Oral   Take 100 mg by mouth 2 (two) times daily. For 10 days starting on 02/24/12         . enoxaparin (LOVENOX) 100 MG/ML injection   Subcutaneous   Inject 1 mL (100 mg total) into the skin daily.   0 Syringe        DISCONTINUE WHEN INR IS > 2.0   . estazolam (PROSOM) 1 MG tablet   Oral   Take 1 mg by mouth at bedtime as needed (for sleep).         [name] glimepiride (AMARYL) 2 MG tablet   Oral   Take 2 mg by mouth daily before breakfast.         . ibuprofen (ADVIL,MOTRIN) 200 MG tablet   Oral   Take 200 mg by mouth every 6 (six) hours as needed. For pain         . metoprolol tartrate (LOPRESSOR) 25 MG tablet   Oral   Take 0.5 tablets (12.5 mg  total) by mouth 2 (two) times daily. Takes 1.5 tablets twice daily         . midodrine (PROAMATINE) 10 MG tablet      3 (three) times daily.          . Multiple Vitamin (MULTIVITAMIN WITH MINERALS) TABS   Oral   Take 1 tablet by mouth daily.         [name] oxyCODONE (ROXICODONE) 5 MG immediate release tablet   Oral   Take 1 tablet (5 mg total) by mouth every 6 (six) hours as needed for pain.   30 tablet   0   . phenylephrine-shark liver oil-mineral oil-petrolatum (PREPARATION H) 0.25-3-14-71.9 % rectal ointment   Rectal   Place 1 application rectally 2 (two) times daily as needed for hemorrhoids.         . simvastatin (ZOCOR) 40 MG tablet   Oral   Take 1 tablet (40 mg total) by mouth at bedtime.   30 tablet   1   . vitamin C (ASCORBIC ACID) 500 MG tablet   Oral   Take 500 mg by mouth daily.         [name] warfarin (COUMADIN) 5 MG tablet   Oral   Take 1 tablet (5 mg total) by mouth as directed. Take As Directed by Anticoagulation Clinic   30 tablet   3    There were no vitals taken for this visit. Physical Exam  Nursing note and vitals reviewed. Constitutional: He is oriented to person, place, and time. He appears well-developed and well-nourished. No distress.  Elderly, pale  HENT:  Head: Normocephalic and atraumatic.  Mouth/Throat: Oropharynx is clear and moist.  Eyes: Conjunctivae are normal. Pupils are equal, round, and reactive to light. No scleral icterus.  Neck: Neck supple.  Cardiovascular: Normal rate and intact distal pulses.  An irregularly irregular rhythm present.  Murmur heard.  Systolic murmur is present with a grade of 3/6  Pulmonary/Chest: Effort normal and breath sounds normal. No stridor. No respiratory distress. He has no wheezes. He has no rales.  Abdominal: Soft. He exhibits no distension. There is no tenderness. There is no rebound and no guarding.  Musculoskeletal: Normal range of motion. He exhibits no edema.  Neurological: He is alert and  oriented to person, place, and time.  Skin: Skin is warm. No rash noted. He is diaphoretic (mildly).  Psychiatric: He has a normal mood and affect. His behavior is normal.    ED Course  Procedures (including critical care time) Labs Review Labs Reviewed  BASIC METABOLIC PANEL - Abnormal; Notable for the following:    Glucose, Bld 213 (*)    BUN 25 (*)    Creatinine, Ser 6.00 (*)    GFR calc non Af Amer 8 (*)    GFR calc Af Amer 9 (*)    All other components within normal limits  PROTIME-INR - Abnormal; Notable for the following:    Prothrombin Time 19.6 (*)    INR 1.71 (*)    All other components within normal limits  GLUCOSE, CAPILLARY - Abnormal; Notable for the following:    Glucose-Capillary 271 (*)    All other components within normal limits  CBC WITH DIFFERENTIAL  TROPONIN I  TROPONIN I  TROPONIN I  TROPONIN I  PROTIME-INR   Imaging Review Dg Chest Port 1 View  10/09/2012   *RADIOLOGY REPORT*  Clinical Data: Syncope after dialysis  PORTABLE CHEST - 1 VIEW  Comparison: 01/23/2012  Findings: Heart size  within normal limits.  Vascular pattern normal.  Lungs clear except for mild lower lobe atelectasis on the left.  The patient noted to be status post CABG.  IMPRESSION: No acute abnormalities   Original Report Authenticated By: Esperanza Heir, M.D.  All radiology studies independently viewed by me.     EKG - a flutter, rate 79, normal axis, QRSD 138, nonspecific ST/T changes, not significantly changed from prior.    MDM   1. Syncope and collapse   2. Aortic stenosis   3. Atrial fibrillation   4. CAD (coronary artery disease)    79 yo male with syncopal episode after dialysis.  No prodromal symptoms.  No chest pain or SOB.  Diaphoresis and vomiting.  No report of seizure like activity.    Workup notable for hyperglycemia and subtherapeutic INR.  Pt will be admitted for further syncope workup.     Candyce Churn, MD 10/10/12 [phone]

## 2012-10-09 NOTE — ED Notes (Signed)
Verbal order from Dr. Conley Rolls to give patient a sandwich and water.

## 2012-10-10 ENCOUNTER — Encounter (HOSPITAL_COMMUNITY): Payer: Self-pay | Admitting: Nurse Practitioner

## 2012-10-10 DIAGNOSIS — Z7901 Long term (current) use of anticoagulants: Secondary | ICD-10-CM

## 2012-10-10 DIAGNOSIS — I70269 Atherosclerosis of native arteries of extremities with gangrene, unspecified extremity: Secondary | ICD-10-CM

## 2012-10-10 DIAGNOSIS — N186 End stage renal disease: Secondary | ICD-10-CM

## 2012-10-10 DIAGNOSIS — I359 Nonrheumatic aortic valve disorder, unspecified: Secondary | ICD-10-CM

## 2012-10-10 LAB — MRSA PCR SCREENING: MRSA by PCR: NEGATIVE

## 2012-10-10 LAB — GLUCOSE, CAPILLARY
Glucose-Capillary: 93 mg/dL (ref 70–99)
Glucose-Capillary: 157 mg/dL — ABNORMAL HIGH (ref 70–99)

## 2012-10-10 LAB — PROTIME-INR
INR: 1.57 — ABNORMAL HIGH (ref 0.00–1.49)
Prothrombin Time: 18.3 seconds — ABNORMAL HIGH (ref 11.6–15.2)

## 2012-10-10 LAB — TROPONIN I: Troponin I: 0.53 ng/mL (ref <0.30)

## 2012-10-10 MED ORDER — METOPROLOL TARTRATE 25 MG PO TABS
25.0000 mg | ORAL_TABLET | Freq: Two times a day (BID) | ORAL | Status: DC
  Administered 2012-10-12 – 2012-10-14 (×6): 25 mg via ORAL
  Filled 2012-10-10 (×11): qty 1

## 2012-10-10 MED ORDER — DOXERCALCIFEROL 4 MCG/2ML IV SOLN
6.0000 ug | INTRAVENOUS | Status: DC
  Administered 2012-10-11 – 2012-10-14 (×2): 6 ug via INTRAVENOUS
  Filled 2012-10-10: qty 4

## 2012-10-10 MED ORDER — WARFARIN SODIUM 5 MG PO TABS
5.0000 mg | ORAL_TABLET | Freq: Once | ORAL | Status: AC
  Administered 2012-10-10: 5 mg via ORAL
  Filled 2012-10-10: qty 1

## 2012-10-10 NOTE — Consult Note (Signed)
Indication for Consultation:  Management of ESRD/hemodialysis; anemia, hypertension/volume and secondary hyperparathyroidism  HPI: Alan [name] is a 79 y.o. male admitted yesterday after experiencing a syncopal episode after HD. He has an extensive history including Afib, aortic stenosis (not a surgical canditate), Bilat BKAs, DM, orthostatic hypotension on midodrine, he receives HD TTS at [facility]. He reports feeling well and tolerated HD yesterday without any problems. His wife picked him up after treatment and said he was fine however when he got home and was trying to get up the ramp in his wheelchair he became very week, vomited and per his wife became unresponsive for about 2 mins. EMS was called and brought the pt to the ED. He denies any chest pain, palpitations, sob, lightheaded, dizzy ness at the time of this episode. He denies feeling nauseous prior to that  Reports normal CBG readings when he checks at home. He thinks too much fluid was removed in HD. In the ED, chest xray showed no acute abormalities, EKG showed he was in aflutter but no acute changes. Echo- EF of 60-65%. Troponin cycling, was up to 0.53, most recent 0.46. Currently he is asymptomatic, no further episodes since yesterday. Resting in bed, he is asking to go home as he is feeling fine. Pt left HD 0.3 above EDW at outpt HD and had stable BP's afterwards.  His IDWG has been variable, as little as 2kg and as high as 5kg.  Past Medical History  Diagnosis Date  . Diabetes mellitus   . Gout   . Hypertension   . Hyperlipidemia   . Arthritis   . Prostate cancer     a. s/p seed implant  . Cataract     a. s/p bilat cataract surgeries.  . Diabetic neuropathy   . Atrial fibrillation     a. chronic/rate controlled;  b. on coumadin  . Moderate aortic stenosis     a. 05/2012 Echo: EF 55-60%, mod AS, mildly dil LA - prev not felt to be a surgical candidate  . Anemia of chronic disease   . Coronary artery disease     a.  remote CABG x 5 in 1996; b. 04/2003 Cath: LAD 100p, LCX min irregs, RCA 68m, VG->D1 nl, VG->AM->PDA->RPL nl, LIMA->LAD nl, EF 60%.  . Edema   . PAD (peripheral artery disease)     a. s/p bilat BKA's (L - 07/2011, R - 02/2012).  . Diabetic retinopathy   . Chronic anticoagulation     a. on coumadin  . End stage renal disease     a. Started dialysis October 2012  . Nephrolithiasis     a. prior history of percutaneous nephrostomy  . Orthostatic hypotension     a. on midodrine   Past Surgical History  Procedure Laterality Date  . Ventral hernia repair    . Inguinal hernia repair      right side  . Colonoscopy    . Kidney stone surgery    . Polypectomy    . Tonsillectomy    . Toe surgery      removal little toe right foot  . Cardiac catheterization  04/19/2003    SEVERE 2 VESSEL OBSTRUCTIVE ATHERSCLEROTIC CAD  . Coronary artery bypass graft  1996    LIMA GRAFT TO THE LAD, SEQUENTIAL SAPHENOUS  VEIN GRAFT TO THE FIRST AND SECOND DIAGONIAL BRANCHES, SEQUENTIAL SAPHENOUS VEIN GRAFT TO THE ACUTE MARGINAL, POSTERIOR DESCENDING, AND POSTERIOR LATERAL BRANCHES OF THE RIGHT CORONARY ARTERY  . Radioactive seed implant    .  US echocardiography  09/06/2009    EF 55-60%  . Cardiovascular stress test  04/13/2003    EF 54%. EVIDENCE OF ANTERO-APICAL ISCHEMIA. NORMAL LV SIZE AND FUNCTION  . Tee without cardioversion  05/09/2011    Procedure: TRANSESOPHAGEAL ECHOCARDIOGRAM (TEE);  Surgeon: Laurey Morale, MD;  Location: [facility] ENDOSCOPY;  Service: Cardiovascular;  Laterality: N/A;  . Hernia repair    . Amputation  07/20/2011    Procedure: AMPUTATION BELOW KNEE;  Surgeon: Nada Libman, MD;  Location: [facility] OR;  Service: Vascular;  Laterality: Left;  . Amputation  01/25/2012    Procedure: AMPUTATION DIGIT;  Surgeon: Chuck Hint, MD;  Location: [facility] OR;  Service: Vascular;  Laterality: Right;  second  . Av fistula placement      right  . Transmetatarsal amputation Right 02/15/2012    Procedure:  TRANSMETATARSAL AMPUTATION;  Surgeon: Nada Libman, MD;  Location: [facility] OR;  Service: Vascular;  Laterality: Right;  . Amputation Right 02/29/2012    Procedure: AMPUTATION BELOW KNEE;  Surgeon: Nada Libman, MD;  Location: [facility] OR;  Service: Vascular;  Laterality: Right;  Right BKA   Family History  Problem Relation Age of Onset  . Heart disease Father   . Hypertension Brother   . Stroke Brother   . Diabetes Mother     Pt. not sure, but he thinks she did.   Social History:  He lives at home with his wife. reports that he quit smoking about 44 years ago. His smoking use included Cigarettes. He smoked 0.00 packs per day for 2 years. He has quit using smokeless tobacco. His smokeless tobacco use included Chew. He reports that he does not drink alcohol or use illicit drugs. Allergies  Allergen Reactions  . Ancef [Cefazolin] Rash    Patient cant recall if allergy to this med exists.    Prior to Admission medications   Medication Sig Start Date End Date Taking? Authorizing Provider  allopurinol (ZYLOPRIM) 300 MG tablet Take 0.5 tablets (150 mg total) by mouth daily. 02/06/12  Yes Daniel J Angiulli, PA-C  aspirin EC 81 MG tablet Take 1 tablet (81 mg total) by mouth daily. 02/06/12  Yes Daniel J Angiulli, PA-C  calcium carbonate (TUMS - DOSED IN MG ELEMENTAL CALCIUM) 500 MG chewable tablet Chew 1 tablet by mouth 3 (three) times daily with meals. Phosphorous binder   Yes Historical Provider, MD  estazolam (PROSOM) 2 MG tablet Take 1 mg by mouth at bedtime as needed (sleep).   Yes Historical Provider, MD  glimepiride (AMARYL) 4 MG tablet Take 2 mg by mouth daily before breakfast.   Yes Historical Provider, MD  ibuprofen (ADVIL,MOTRIN) 200 MG tablet Take 200 mg by mouth every 6 (six) hours as needed. For pain   Yes Historical Provider, MD  metoprolol tartrate (LOPRESSOR) 25 MG tablet Take 1.5 mg by mouth 2 (two) times daily.   Yes Historical Provider, MD  midodrine (PROAMATINE) 10 MG tablet Take 10  mg by mouth See admin instructions. Take 1 tablet by mouth 1 hour prior to dialysis 04/15/12  Yes Historical Provider, MD  multivitamin (RENA-VIT) TABS tablet Take 1 tablet by mouth daily.   Yes Historical Provider, MD  simvastatin (ZOCOR) 40 MG tablet Take 1 tablet (40 mg total) by mouth at bedtime. 02/06/12  Yes Daniel J Angiulli, PA-C  warfarin (COUMADIN) 5 MG tablet Take 2.5-5 mg by mouth daily. Take 5mg  SuMTTS and 2.5mg  on Wed   Yes Historical Provider, MD   Current Facility-Administered  Medications  Medication Dose Route Frequency Provider Last Rate Last Dose  . 0.9 %  sodium chloride infusion  250 mL Intravenous PRN [place] Siren, MD      . allopurinol (ZYLOPRIM) tablet 150 mg  150 mg Oral Daily [place] Siren, MD   150 mg at 10/10/12 4098  . aspirin EC tablet 81 mg  81 mg Oral Daily [place] Siren, MD   81 mg at 10/10/12 1191  . calcium carbonate (TUMS - dosed in mg elemental calcium) chewable tablet 200 mg of elemental calcium  1 tablet Oral TID WC [place] Siren, MD   200 mg of elemental calcium at 10/10/12 0640  . glimepiride (AMARYL) tablet 2 mg  2 mg Oral QAC breakfast [place] Siren, MD   2 mg at 10/10/12 4782  . insulin aspart (novoLOG) injection 0-5 Units  0-5 Units Subcutaneous QHS [place] Siren, MD   3 Units at 10/09/12 2340  . insulin aspart (novoLOG) injection 0-9 Units  0-9 Units Subcutaneous TID WC [place] Siren, MD      . metoprolol tartrate (LOPRESSOR) tablet 37.5 mg  37.5 mg Oral BID [place] Siren, MD   37.5 mg at 10/09/12 2332  . [START ON 10/11/2012] midodrine (PROAMATINE) tablet 10 mg  10 mg Oral Q T,Th,Sa-HD [place] Siren, MD      . multivitamin (RENA-VIT) tablet 1 tablet  1 tablet Oral QHS [place] Siren, MD   1 tablet at 10/09/12 2332  . simvastatin (ZOCOR) tablet 40 mg  40 mg Oral QHS [place] Siren, MD   40 mg at 10/09/12 2332  . sodium chloride 0.9 % injection 3 mL  3 mL Intravenous Q12H [place] Siren, MD   3 mL at 10/09/12 2334  . sodium chloride 0.9 % injection 3 mL  3 mL Intravenous Q12H [place] Siren, MD   3 mL at 10/09/12 2334  .  sodium chloride 0.9 % injection 3 mL  3 mL Intravenous PRN [place] Siren, MD   3 mL at 10/10/12 0929  . warfarin (COUMADIN) tablet 5 mg  5 mg Oral ONCE-1800 Riki Rusk, [facility]      . Warfarin - Pharmacist Dosing Inpatient   Does not apply q1800 Benny Lennert, [facility]       Labs: Basic Metabolic Panel:  Recent Labs Lab 10/09/12 1845  NA 145  K 3.5  CL 99  CO2 29  GLUCOSE 213*  BUN 25*  CREATININE 6.00*  CALCIUM 9.9   Liver Function Tests: No results found for this basename: AST, ALT, ALKPHOS, BILITOT, PROT, ALBUMIN,  in the last 168 hours No results found for this basename: LIPASE, AMYLASE,  in the last 168 hours No results found for this basename: AMMONIA,  in the last 168 hours CBC:  Recent Labs Lab 10/09/12 1845  WBC 7.8  NEUTROABS 5.4  HGB 15.9  HCT 46.2  MCV 94.1  PLT 168   Cardiac Enzymes:  Recent Labs Lab 10/09/12 1845 10/10/12 0055 10/10/12 0710 10/10/12 1240  TROPONINI <0.30 0.42* 0.53* 0.46*   CBG:  Recent Labs Lab 10/09/12 2301 10/10/12 0712 10/10/12 1222 10/10/12 1630  GLUCAP 271* 93 158* 138*   Iron Studies: No results found for this basename: IRON, TIBC, TRANSFERRIN, FERRITIN,  in the last 72 hours Studies/Results: Dg Chest Port 1 View  10/09/2012   *RADIOLOGY REPORT*  Clinical Data: Syncope after dialysis  PORTABLE CHEST - 1 VIEW  Comparison: 01/23/2012  Findings: Heart size within normal limits.  Vascular pattern normal.  Lungs clear except for  mild lower lobe atelectasis on the left.  The patient noted to be status post CABG.  IMPRESSION: No acute abnormalities   Original Report Authenticated By: Esperanza Heir, M.D.      Review of Systems: Gen: Denies any fever, chills, sweats,  fatigue, weight loss. Reports weakness yesterday but resolved now HEENT: No visual complaints,  Normal external appearance    CV: Denies chest pain, angina, palpitations, orthopnea, PND, peripheral edema,  Resp: Denies dyspnea at rest, dyspnea with exercise,  cough, wheezing. GI: Reports one episode of vomiting yesterday. Denies nausea and abd pain. GU : anuric MS: Denies joint pain, limitation of movement, and swelling, stiffness, low back pain, extremity pain. Denies muscle weakness Derm: Denies rash, itching, dry skin, or unhealing ulcers.  Psych: Denies depression, anxiety,  and confusion. Heme: Denies bruising, bleeding, and enlarged lymph nodes. Neuro: No headache.  No diplopia. No dysarthria.  No dysphasia. .  . No paresthesias.  No weakness. Endocrine: DM  Physical Exam: Filed Vitals:   10/10/12 0601 10/10/12 0745 10/10/12 0949 10/10/12 1343  BP: 92/58  88/40 94/49  Pulse: 63  81 81  Temp: 97.6 F (36.4 C)  98 F (36.7 C) 97.9 F (36.6 C)  TempSrc: Oral  Oral Oral  Resp: 20  18 18   Height:      Weight:      SpO2: 96% 97% 98% 97%     General: Well developed, well nourished, in no acute distress. Pleasant Head: Normocephalic, atraumatic, sclera non-icteric, mucus membranes are moist Neck: Supple. JVD not elevated. No carotid bruits Lungs: Clear bilaterally to auscultation without wheezes, rales, or rhonchi. Breathing is unlabored. Heart: Irregular with systolic murmur Abdomen: Soft, non-tender, non-distended with normoactive bowel sounds. No rebound/guarding. No obvious abdominal masses. M-S:  Strength and tone appear normal for age.  Lower extremities: Bilat BKAs, no edema Neuro: Alert and oriented X 3. Moves all extremities spontaneously. Psych:  Responds to questions appropriately with a normal affect. Dialysis Access: RUA AVF +bruit and thrill  Dialysis Orders:  TTS @ [facility] 96.5 Kg  2K/2.25 Ca    4hr 15 min    3400 u  Heparin  RUA AVF   500/1.5 AF   Hectorol 6 mcg IV/HD  No epo/ no venofer   Assessment/Plan:  1. Syncope- post HD yesterday, ? Related to exertion. WBC 7.8. Chest xray, no acute changes. EKG, aflutter. ECHO- EF 60-65%. Elevated troponin, 0.53, then 0.46. Currently asymptomatic. Cardiology consulted,  will arrange for outpt event monitoring.  D/w Dr. Tenny Craw and she has ordered repeat ECHO to see if his AS has worsened since his last imaging.  Cont with midodrine prior to HD but not daily until AS has been re-evaluated 2. Afib/Aflutter- a flutter on EKG. Management per primary, rate controlled. Coumadin per pharmacy. INR 1.57. On tele.  3.  ESRD -  TTS @ [facility]. K+ 3.5. HD pending for tomorrow. 4.  Hypertension/volume  - BP 94/49 Actually hypotension, on midodrine 10mg  pre HD. No volume excess.  Normally remove 2.5kg for prosthetics but weights in hospital are much different from the outpt unit.  Will only UF 1L tomorrow with HD 5.  Anemia  - hgb 15.9, no ESA outpt. Tsat 28 (9/25) 6. Metabolic bone disease -  Ca+ 9.9  Phos 4.9.  Pth 124. hectorol . Tums AC 7.  Nutrition - Alb 4.0 (9/25) renal diet. Multi vit 8. DM- per primary. On glimepiride and aspart. CBG 158 9. AS- moderate at last ECHO.  Now  with syncope.  Not an operable candidate  Jetty Duhamel, NP [facility] [phone] 10/10/2012, 4:54 PM   I have seen and examined this patient and agree with plan as outlined above with additional findings highlighted from the above note.  Biggest concern is syncope in ESRD pt with AS and chronic hypotension.  Discussed case with Dr. Tenny Craw who wants to evaluate AVA but agreed not to increase midodrine to daily d/t issues with afterload and AS. Dung Prien A,MD 10/11/2012 8:43 AM

## 2012-10-10 NOTE — Progress Notes (Signed)
[facility] PROGRESS NOTE  Alan [name] Frankie UEA:[id number] DOB: 11-May-1931 DOA: 10/09/2012 PCP: Junious Silk, MD  Interval history 79 y.o. male with hx of afib on Coumadin, ESRD on HD (T, Thrs, Sat), hx of moderate AS (not a surgical candidate), known orthostatic symptomology on Midodrine, HTN, hyperlipidemia, his of bilateral amputations, received dialysis today uneventfully, but had a syncope as he was helping wheeling himself up the ramp of his house.  He didn't have any chest pain, HA, palpitation, Sz activities, bowel or bladder incontinence. His wife said he was out for only a minute or two. Evaluation in the ER showed normal WBC, Hb, Cr of 6.0, K of 3.5, and negative troponin. His EKG showed no acute ST-T changes, in aflutter.    Assessment/Plan:  Elevated Troponin-Troponin went up to 0.53, though he is asymptomatic. He is followed by cardiology as outpatient, will consult cardiology.  Syncope- Patient became hypotensive after the HD. Currently monitoring on tele.Will need outpatient holter monitoring.  ESRD- Patient gets HD TTS, will call nephrology for HD in am.  Atrial flutter- Patient is in Atrial flutter, rate is controlled. Continue anticoagulation, pharmacy is managing the dose . INR is 1.57 today.  Diabetes Mellitus- Blood glucose is well controlled, continue with Amaryl and SSI  Code Status: Full code Family Communication: *Discussed with patient Disposition Plan: TBD   Consultants:  *Cardiology  Procedures:  None  Antibiotics:  *None  HPI/Subjective: Patient seen and examined, says he feels better. Denies any chest pains. He now has elevated troponin.  Objective: Filed Vitals:   10/10/12 1343  BP: 94/49  Pulse: 81  Temp: 97.9 F (36.6 C)  Resp: 18    Intake/Output Summary (Last 24 hours) at 10/10/12 1603 Last data filed at 10/10/12 1345  Gross per 24 hour  Intake   1170 ml  Output      0 ml  Net   1170 ml   Filed Weights   10/09/12 2223  Weight: 92 kg (202 lb 13.2 oz)    Exam:   General:  Appear in no acute distress  Cardiovascular: S1s2 RRR  Respiratory: Clear bilaterally  Abdomen: Soft, nontender  Musculoskeletal: No edema of the lower extremities  Data Reviewed: Basic Metabolic Panel:  Recent Labs Lab 10/09/12 1845  NA 145  K 3.5  CL 99  CO2 29  GLUCOSE 213*  BUN 25*  CREATININE 6.00*  CALCIUM 9.9   Liver Function Tests: No results found for this basename: AST, ALT, ALKPHOS, BILITOT, PROT, ALBUMIN,  in the last 168 hours No results found for this basename: LIPASE, AMYLASE,  in the last 168 hours No results found for this basename: AMMONIA,  in the last 168 hours CBC:  Recent Labs Lab 10/09/12 1845  WBC 7.8  NEUTROABS 5.4  HGB 15.9  HCT 46.2  MCV 94.1  PLT 168   Cardiac Enzymes:  Recent Labs Lab 10/09/12 1845 10/10/12 0055 10/10/12 0710 10/10/12 1240  TROPONINI <0.30 0.42* 0.53* 0.46*   BNP (last 3 results) No results found for this basename: PROBNP,  in the last 8760 hours CBG:  Recent Labs Lab 10/09/12 2301 10/10/12 0712 10/10/12 1222  GLUCAP 271* 93 158*    Recent Results (from the past 240 hour(s))  MRSA PCR SCREENING     Status: None   Collection Time    10/10/12  8:54 AM      Result Value Range Status   MRSA by PCR NEGATIVE  NEGATIVE Final   Comment:  The GeneXpert MRSA Assay (FDA     approved for NASAL specimens     only), is one component of a     comprehensive MRSA colonization     surveillance program. It is not     intended to diagnose MRSA     infection nor to guide or     monitor treatment for     MRSA infections.     Studies: Dg Chest Port 1 View  10/09/2012   *RADIOLOGY REPORT*  Clinical Data: Syncope after dialysis  PORTABLE CHEST - 1 VIEW  Comparison: 01/23/2012  Findings: Heart size within normal limits.  Vascular pattern normal.  Lungs clear except for mild lower lobe atelectasis on the left.  The patient noted to  be status post CABG.  IMPRESSION: No acute abnormalities   Original Report Authenticated By: Esperanza Heir, M.D.    Scheduled Meds: . allopurinol  150 mg Oral Daily  . aspirin EC  81 mg Oral Daily  . calcium carbonate  1 tablet Oral TID WC  . glimepiride  2 mg Oral QAC breakfast  . insulin aspart  0-5 Units Subcutaneous QHS  . insulin aspart  0-9 Units Subcutaneous TID WC  . metoprolol tartrate  37.5 mg Oral BID  . [START ON 10/11/2012] midodrine  10 mg Oral Q T,Th,Sa-HD  . multivitamin  1 tablet Oral QHS  . simvastatin  40 mg Oral QHS  . sodium chloride  3 mL Intravenous Q12H  . sodium chloride  3 mL Intravenous Q12H  . warfarin  5 mg Oral ONCE-1800  . Warfarin - Pharmacist Dosing Inpatient   Does not apply q1800   Continuous Infusions:   Principal Problem:   Syncope and collapse Active Problems:   Aortic stenosis   CAD (coronary artery disease)   Diabetes mellitus   Chronic anticoagulation   Hypotension   Atrial fibrillation   Orthostatic hypotension    Time spent: 25 min    [facility]  [facility] Pager [phone]. If 7PM-7AM, please contact night-coverage at [email or web address], password [facility] 10/10/2012, 4:03 PM  LOS: 1 day

## 2012-10-10 NOTE — Progress Notes (Signed)
DR Sharl Ma aware of cardiac markers 12 lead EKG done Dr has called for cardiac consult.

## 2012-10-10 NOTE — Progress Notes (Signed)
Echo today shows and increase in mean gradient across AV  Now 39 mm Hg (up from 31 mm Hg).   AS is moderately severe to severe. Echo also showed IVC is rel small (12.5 mm)  This is smaller than on echo in spring.  Question if patient's current dry weight too low.  Combination of decreased preload and AS in setting of exertion  could explain syncope I would recomm decreasing metoprolol to 25 bid   Follow HR. I would not favor midodrine as part of daily regimen. Careful use with dialysis. WIll need to review AS with [facility]. I

## 2012-10-10 NOTE — Progress Notes (Signed)
ANTICOAGULATION CONSULT NOTE - Initial Consult  Pharmacy Consult for coumadin Indication: atrial fibrillation  Allergies  Allergen Reactions  . Ancef [Cefazolin] Rash    Patient cant recall if allergy to this med exists.     Patient Measurements: Height: 6\' 1"  (185.4 cm) (with bilateral prosthesis) Weight: 202 lb 13.2 oz (92 kg) IBW/kg (Calculated) : 79.9  Vital Signs: Temp: 98 F (36.7 C) (10/03 0949) Temp src: Oral (10/03 0949) BP: 88/40 mmHg (10/03 0949) Pulse Rate: 81 (10/03 0949)  Labs:  Recent Labs  10/09/12 1845 10/10/12 0055 10/10/12 0710 10/10/12 0935  HGB 15.9  --   --   --   HCT 46.2  --   --   --   PLT 168  --   --   --   LABPROT 19.6*  --   --  18.3*  INR 1.71*  --   --  1.57*  CREATININE 6.00*  --   --   --   TROPONINI <0.30 0.42* 0.53*  --     Estimated Creatinine Clearance: 11.5 ml/min (by C-G formula based on Cr of 6).  Assessment: 79 yo male here with syncopal episode. Continued on coumadin for afib. INR 1.57 < 1.71, received 5mg  last night at 2330. No bleeding noted per chart.   Home coumadin regimen: 5mg /day except 2.5mg  on Wed (last dose 10/08/2012)  Goal of Therapy:  INR 2-3 Monitor platelets by anticoagulation protocol: Yes   Plan:  -Coumadin 5mg  po today -Daily PT/INR  Bayard Hugger, PharmD, BCPS  Clinical Pharmacist  Pager: [phone]   10/10/2012 10:44 AM

## 2012-10-10 NOTE — Consult Note (Signed)
CARDIOLOGY CONSULT NOTE  Patient ID: Alan [name] MRN: [id number], DOB/AGE: [birth year]   Admit date: 10/09/2012 Date of Consult: 10/10/2012   Primary Physician: Junious Silk, MD Primary Cardiologist: P. [place], MD   Pt. Profile  79 year old male with prior history of CAD, PND, and moderate aortic stenosis who presented to the hospital yesterday following a syncopal episode and has been found to have a mildly elevated troponin.  Problem List  Past Medical History  Diagnosis Date  . Diabetes mellitus   . Gout   . Hypertension   . Hyperlipidemia   . Arthritis   . Prostate cancer     a. s/p seed implant  . Cataract     a. s/p bilat cataract surgeries.  . Diabetic neuropathy   . Atrial fibrillation     a. chronic/rate controlled;  b. on coumadin  . Moderate aortic stenosis     a. 05/2012 Echo: EF 55-60%, mod AS, mildly dil LA - prev not felt to be a surgical candidate  . Anemia of chronic disease   . Coronary artery disease     a. remote CABG x 5 in 1996; b. 04/2003 Cath: LAD 100p, LCX min irregs, RCA 48m, VG->D1 nl, VG->AM->PDA->RPL nl, LIMA->LAD nl, EF 60%.  . Edema   . PAD (peripheral artery disease)     a. s/p bilat BKA's (L - 07/2011, R - 02/2012).  . Diabetic retinopathy   . Chronic anticoagulation     a. on coumadin  . End stage renal disease     a. Started dialysis October 2012  . Nephrolithiasis     a. prior history of percutaneous nephrostomy  . Orthostatic hypotension     a. on midodrine    Past Surgical History  Procedure Laterality Date  . Ventral hernia repair    . Inguinal hernia repair      right side  . Colonoscopy    . Kidney stone surgery    . Polypectomy    . Tonsillectomy    . Toe surgery      removal little toe right foot  . Cardiac catheterization  04/19/2003    SEVERE 2 VESSEL OBSTRUCTIVE ATHERSCLEROTIC CAD  . Coronary artery bypass graft  1996    LIMA GRAFT TO THE LAD, SEQUENTIAL SAPHENOUS  VEIN GRAFT TO THE FIRST AND SECOND  DIAGONIAL BRANCHES, SEQUENTIAL SAPHENOUS VEIN GRAFT TO THE ACUTE MARGINAL, POSTERIOR DESCENDING, AND POSTERIOR LATERAL BRANCHES OF THE RIGHT CORONARY ARTERY  . Radioactive seed implant    . US echocardiography  09/06/2009    EF 55-60%  . Cardiovascular stress test  04/13/2003    EF 54%. EVIDENCE OF ANTERO-APICAL ISCHEMIA. NORMAL LV SIZE AND FUNCTION  . Tee without cardioversion  05/09/2011    Procedure: TRANSESOPHAGEAL ECHOCARDIOGRAM (TEE);  Surgeon: Laurey Morale, MD;  Location: [facility] ENDOSCOPY;  Service: Cardiovascular;  Laterality: N/A;  . Hernia repair    . Amputation  07/20/2011    Procedure: AMPUTATION BELOW KNEE;  Surgeon: Nada Libman, MD;  Location: [facility] OR;  Service: Vascular;  Laterality: Left;  . Amputation  01/25/2012    Procedure: AMPUTATION DIGIT;  Surgeon: Chuck Hint, MD;  Location: [facility] OR;  Service: Vascular;  Laterality: Right;  second  . Av fistula placement      right  . Transmetatarsal amputation Right 02/15/2012    Procedure: TRANSMETATARSAL AMPUTATION;  Surgeon: Nada Libman, MD;  Location: [facility] OR;  Service: Vascular;  Laterality: Right;  . Amputation Right 02/29/2012  Procedure: AMPUTATION BELOW KNEE;  Surgeon: Nada Libman, MD;  Location: [facility] OR;  Service: Vascular;  Laterality: Right;  Right BKA     Allergies  Allergies  Allergen Reactions  . Ancef [Cefazolin] Rash    Patient cant recall if allergy to this med exists.    HPI   79 year old male with the above complex problem list. He is status post coronary artery bypass grafting x5 in 1996. His last catheterization was performed in 2005 and showed 5 of 5 patent grafts and normal LV function. He has also been followed for aortic stenosis, which was found to be moderate on his most recent echo in May of 2014. He has chronic atrial fibrillation/flutter, which is rate controlled and he is anticoagulated with Coumadin. He also has a history of orthostatic hypotension and has to take midodrine 2 hrs prior to  each dialysis session (T, Th, Sat).  He is relatively sedentary @ home and does not usually experience chest pain or dyspnea.  Despite his h/o orthostatic hypotension, he seldom experiences lightheadedness.  Yesterday he underwent a 5 hour dialysis session.  He says that afterwards, he felt somewhat weak and drained.  Upon returning home, he got into his wheelchair and he wheeled himself up the ramp of his home while his wife assisted by pushing from behind.  By the time he got to the top of the ramp, @ the front door of his home, he felt weak and became diaphoretic.  His wife says that she locked his chair and turned to unlock the front door.  When she turned back toward Mr. Bethel, he was unresponsive with his head leaning to one side and his dentures were falling out.  His wife says that he remained unresponsive for 2-3 minutes.  She went in the house to call EMS and when she returned to the porch, he was responsive.  He vomited x 1.  Upon EMS arrival, he was reportedly hypotensive.  He was taken to the [facility] ED for further eval.  There, ecg was non-acute and initial troponin was normal.  BP was 114/60, HR 84.  Orthostatic VS were unable to be completed 2/2 weakness.  He was admitted for further eval and overnight has had no events on the monitor.  His troponin has risen to 0.53.  He denies chest pain, dyspnea, or recurrent presyncope.  His blood pressures have been running in the high 80's to low 90's.  We've been asked to eval.  Inpatient Medications  . allopurinol  150 mg Oral Daily  . aspirin EC  81 mg Oral Daily  . calcium carbonate  1 tablet Oral TID WC  . glimepiride  2 mg Oral QAC breakfast  . insulin aspart  0-5 Units Subcutaneous QHS  . insulin aspart  0-9 Units Subcutaneous TID WC  . metoprolol tartrate  37.5 mg Oral BID  . [START ON 10/11/2012] midodrine  10 mg Oral Q T,Th,Sa-HD  . multivitamin  1 tablet Oral QHS  . simvastatin  40 mg Oral QHS  . sodium chloride  3 mL Intravenous Q12H    . sodium chloride  3 mL Intravenous Q12H  . warfarin  5 mg Oral ONCE-1800  . Warfarin - Pharmacist Dosing Inpatient   Does not apply q1800    Family History Family History  Problem Relation Age of Onset  . Heart disease Father   . Hypertension Brother   . Stroke Brother   . Diabetes Mother     Pt. not  sure, but he thinks she did.     Social History History   Social History  . Marital Status: Married    Spouse Name: N/A    Number of Children: N/A  . Years of Education: N/A   Occupational History  . Not on file.   Social History Main Topics  . Smoking status: Former Smoker -- 2 years    Types: Cigarettes    Quit date: 04/10/1968  . Smokeless tobacco: Former Neurosurgeon    Types: Chew  . Alcohol Use: No  . Drug Use: No  . Sexual Activity: No   Other Topics Concern  . Not on file   Social History Narrative   Lives in [place] with his wife.  He is mostly wheelchair bound.  He does have bilat prostheses but isn't accustomed to using them yet.     Review of Systems  General:  No chills, fever, night sweats or weight changes.  Cardiovascular:  +++ syncope as outlined above.  No chest pain, dyspnea on exertion, edema, orthopnea, palpitations, paroxysmal nocturnal dyspnea. Dermatological: No rash, lesions/masses Respiratory: No cough, dyspnea Urologic: No hematuria, dysuria Abdominal:   +++ nausea, vomiting after syncopal episode yesterday.  He has chronic constipation.  No diarrhea, bright red blood per rectum, melena, or hematemesis Neurologic:  No visual changes, wkns, changes in mental status. All other systems reviewed and are otherwise negative except as noted above.  Physical Exam  Blood pressure 88/40, pulse 81, temperature 98 F (36.7 C), temperature source Oral, resp. rate 18, height 6\' 1"  (1.854 m), weight 202 lb 13.2 oz (92 kg), SpO2 98.00%.  General: Pleasant, NAD Psych: Normal affect. Neuro: Alert and oriented X 3. Moves all extremities spontaneously. HEENT:  Normal  Neck: Supple without bruits or JVD. Lungs:  Resp regular and unlabored, CTA. Heart: RRR no s3, s4, 3/6 SEM loudest @ the RUSB.  Decreased S2   Abdomen: Soft, non-tender, non-distended, BS + x 4.  Extremities: No clubbing, cyanosis or edema.  bilat BKA.   Labs   Recent Labs  10/09/12 1845 10/10/12 0055 10/10/12 0710  TROPONINI <0.30 0.42* 0.53*   Lab Results  Component Value Date   WBC 7.8 10/09/2012   HGB 15.9 10/09/2012   HCT 46.2 10/09/2012   MCV 94.1 10/09/2012   PLT 168 10/09/2012     Recent Labs Lab 10/09/12 1845  NA 145  K 3.5  CL 99  CO2 29  BUN 25*  CREATININE 6.00*  CALCIUM 9.9  GLUCOSE 213*   Radiology/Studies  Dg Chest Port 1 View  10/09/2012   *RADIOLOGY REPORT*  Clinical Data: Syncope after dialysis  PORTABLE CHEST - 1 VIEW  Comparison: 01/23/2012  Findings: Heart size within normal limits.  Vascular pattern normal.  Lungs clear except for mild lower lobe atelectasis on the left.  The patient noted to be status post CABG.  IMPRESSION: No acute abnormalities   Original Report Authenticated By: Esperanza Heir, M.D.   ECG  Atrial flutter, 80, prior inferior and anterior infarcts, no acute ST or T changes.  ASSESSMENT AND PLAN  1.  Syncope:  Pt experienced a syncopal episode yesterday.  Suspect this was secondary to exertion following a prolonged dialysis session, with underlying orthostatic hypotension and moderate aortic stenosis.  BP reportedly low on scene and has been trending in high 80's to low 90's here.  No events on tele.  Repeat echo pending. Will arrange for outpt event monitoring to r/o brady/tachyarrhythmia.    2.  Elevated  Troponin/CAD:  Likely subendocardial ischemia in the setting of above.  No chest pain or sob.  Await echo.  If LV function remains normal, will not plan to pursue additional ischemic evaluation at this time, though may consider nuclear imaging as an outpatient.  Cont asa, bb, statin therapy.  3.  Aflutter/fib:  Rate  controlled.  Continue anticoagulation.  4.  ESRD:  T, Th, Sat dialysis.  Would likely be best to dialyze him in the hospital tomorrow so that we can assess his BP's and symptoms during and then after dialysis.  He feels that his session yesterday was too long and that perhaps too much fluid was taken off.  5.  Mod AS:  Repeat echo pending.    Signed, Nicolasa Ducking, NP 10/10/2012, 12:33 PM   Patient seen and examined.  I have amended note above to reflect my findings.   Syncopal spell most likely multifactoral in origin  Patient with at least moderate AS, baseline low BP, just finished dialysis, performing aerobic work Minimal troponin elevation reflects probable subendocardial strain in setting of known CAD and ESRD I would follow  Echo pending. Would favor dialysis in hospital to demonstrate hemodynamics.  If tolerates would d/c with close outpatient f/u  Watch BP  May need to adjust dry wt if BP too low. Event monitor is reasonable at d/c.    Dietrich Pates 10/10/2012

## 2012-10-10 NOTE — Progress Notes (Signed)
  Echocardiogram 2D Echocardiogram has been performed.  Alan [name] 10/10/2012, 11:44 AM

## 2012-10-11 DIAGNOSIS — K5900 Constipation, unspecified: Secondary | ICD-10-CM | POA: Diagnosis present

## 2012-10-11 LAB — CBC
Hemoglobin: 13.5 g/dL (ref 13.0–17.0)
MCH: 31.9 pg (ref 26.0–34.0)
MCHC: 33.9 g/dL (ref 30.0–36.0)
WBC: 9.4 10*3/uL (ref 4.0–10.5)

## 2012-10-11 LAB — RENAL FUNCTION PANEL
Calcium: 9.2 mg/dL (ref 8.4–10.5)
Chloride: 92 mEq/L — ABNORMAL LOW (ref 96–112)
Creatinine, Ser: 9.62 mg/dL — ABNORMAL HIGH (ref 0.50–1.35)
GFR calc Af Amer: 5 mL/min — ABNORMAL LOW (ref >90)
Glucose, Bld: 194 mg/dL — ABNORMAL HIGH (ref 70–99)
Phosphorus: 5.2 mg/dL — ABNORMAL HIGH (ref 2.3–4.6)
Potassium: 4.4 mEq/L (ref 3.5–5.1)
Sodium: 134 mEq/L — ABNORMAL LOW (ref 135–145)

## 2012-10-11 LAB — PROTIME-INR
INR: 1.75 — ABNORMAL HIGH (ref 0.00–1.49)
Prothrombin Time: 19.9 seconds — ABNORMAL HIGH (ref 11.6–15.2)

## 2012-10-11 LAB — GLUCOSE, CAPILLARY
Glucose-Capillary: 94 mg/dL (ref 70–99)
Glucose-Capillary: 188 mg/dL — ABNORMAL HIGH (ref 70–99)
Glucose-Capillary: 123 mg/dL — ABNORMAL HIGH (ref 70–99)

## 2012-10-11 MED ORDER — DOXERCALCIFEROL 4 MCG/2ML IV SOLN
INTRAVENOUS | Status: AC
  Filled 2012-10-11: qty 4

## 2012-10-11 MED ORDER — SODIUM CHLORIDE 0.9 % IV SOLN: 100.0000 mL | INTRAVENOUS | Status: DC | PRN

## 2012-10-11 MED ORDER — HEPARIN SODIUM (PORCINE) 1000 UNIT/ML DIALYSIS
3400.0000 [IU] | Freq: Once | INTRAMUSCULAR | Status: AC
  Administered 2012-10-11: 3400 [IU] via INTRAVENOUS_CENTRAL

## 2012-10-11 MED ORDER — PENTAFLUOROPROP-TETRAFLUOROETH EX AERO: 1.0000 "application " | INHALATION_SPRAY | CUTANEOUS | Status: DC | PRN

## 2012-10-11 MED ORDER — HEPARIN SODIUM (PORCINE) 1000 UNIT/ML DIALYSIS: 1000.0000 [IU] | INTRAMUSCULAR | Status: DC | PRN

## 2012-10-11 MED ORDER — LIDOCAINE-PRILOCAINE 2.5-2.5 % EX CREA: 1.0000 "application " | TOPICAL_CREAM | CUTANEOUS | Status: DC | PRN

## 2012-10-11 MED ORDER — NEPRO/CARBSTEADY PO LIQD
237.0000 mL | ORAL | Status: DC | PRN
  Filled 2012-10-11: qty 237

## 2012-10-11 MED ORDER — LIDOCAINE HCL (PF) 1 % IJ SOLN: 5.0000 mL | INTRAMUSCULAR | Status: DC | PRN

## 2012-10-11 MED ORDER — WARFARIN SODIUM 5 MG PO TABS
5.0000 mg | ORAL_TABLET | Freq: Once | ORAL | Status: AC
  Administered 2012-10-11: 17:00:00 5 mg via ORAL
  Filled 2012-10-11: qty 1

## 2012-10-11 MED ORDER — ALTEPLASE 2 MG IJ SOLR
2.0000 mg | Freq: Once | INTRAMUSCULAR | Status: DC | PRN
  Filled 2012-10-11: qty 2

## 2012-10-11 MED ORDER — FLEET ENEMA 7-19 GM/118ML RE ENEM
1.0000 | ENEMA | Freq: Once | RECTAL | Status: AC
  Administered 2012-10-11: 1 via RECTAL
  Filled 2012-10-11: qty 1

## 2012-10-11 MED ORDER — ACETAMINOPHEN 325 MG PO TABS
650.0000 mg | ORAL_TABLET | Freq: Four times a day (QID) | ORAL | Status: DC | PRN
  Administered 2012-10-11 – 2012-10-14 (×2): 650 mg via ORAL
  Filled 2012-10-11: qty 2

## 2012-10-11 MED ORDER — MIDODRINE HCL 5 MG PO TABS
ORAL_TABLET | ORAL | Status: AC
  Filled 2012-10-11: qty 2

## 2012-10-11 MED ORDER — BISACODYL 10 MG RE SUPP
10.0000 mg | Freq: Every day | RECTAL | Status: DC | PRN
  Filled 2012-10-11: qty 1

## 2012-10-11 NOTE — Progress Notes (Signed)
ANTICOAGULATION CONSULT NOTE - Initial Consult  Pharmacy Consult for coumadin Indication: atrial fibrillation  Allergies  Allergen Reactions  . Ancef [Cefazolin] Rash    Patient cant recall if allergy to this med exists.     Patient Measurements: Height: 6\' 1"  (185.4 cm) (with bilateral prosthesis) Weight: 210 lb 5.1 oz (95.4 kg) IBW/kg (Calculated) : 79.9  Vital Signs: Temp: 98.3 F (36.8 C) (10/04 0820) Temp src: Oral (10/04 0820) BP: 96/42 mmHg (10/04 1000) Pulse Rate: 77 (10/04 1000)  Labs:  Recent Labs  10/09/12 1845 10/10/12 0055 10/10/12 0710 10/10/12 0935 10/10/12 1240 10/11/12 0510 10/11/12 0915  HGB 15.9  --   --   --   --   --  13.5  HCT 46.2  --   --   --   --   --  39.8  PLT 168  --   --   --   --   --  166  LABPROT 19.6*  --   --  18.3*  --  19.9*  --   INR 1.71*  --   --  1.57*  --  1.75*  --   CREATININE 6.00*  --   --   --   --   --   --   TROPONINI <0.30 0.42* 0.53*  --  0.46*  --   --     Estimated Creatinine Clearance: 11.5 ml/min (by C-G formula based on Cr of 6).  Assessment: 79 yo male here with syncopal episode. Continued on coumadin for afib. INR 1.75 trending up. Hgb 13.5 < 15.9, no bleeding noted per chart. Will continue monitoring CBC closely.  Home coumadin regimen: 5mg /day except 2.5mg  on Wed (last dose 10/08/2012)  Goal of Therapy:  INR 2-3 Monitor platelets by anticoagulation protocol: Yes   Plan:  -Coumadin 5mg  po today -Daily PT/INR  Bayard Hugger, PharmD, BCPS  Clinical Pharmacist  Pager: [phone]   10/11/2012 10:22 AM

## 2012-10-11 NOTE — Progress Notes (Signed)
Patient ID: Alan [name], male   DOB: [birth year], 79 y.o.   MRN: [id number]  [facility] Progress Note    Subjective:   No new complaints   Objective:   BP 110/66  Pulse 58  Temp(Src) 98.2 F (36.8 C) (Oral)  Resp 20  Ht 6\' 1"  (1.854 m)  Wt 92 kg (202 lb 13.2 oz)  BMI 26.77 kg/m2  SpO2 99%  Intake/Output: I/O last 3 completed shifts: In: 1170 [P.O.:920; I.V.:250] Out: 0    Intake/Output this shift:    Weight change:   Physical Exam: Gen:WD elderly AAM in NAD CVS:RRR with III/VI SEM Resp:cta Abd:+BS, soft, NT Ext:s/p bilateral BKA's, RUE AVF +T/B  Labs: BMET  Recent Labs Lab 10/09/12 1845  NA 145  K 3.5  CL 99  CO2 29  GLUCOSE 213*  BUN 25*  CREATININE 6.00*  CALCIUM 9.9   CBC  Recent Labs Lab 10/09/12 1845  WBC 7.8  NEUTROABS 5.4  HGB 15.9  HCT 46.2  MCV 94.1  PLT 168    @IMGRELPRIORS @ Medications:    . allopurinol  150 mg Oral Daily  . aspirin EC  81 mg Oral Daily  . calcium carbonate  1 tablet Oral TID WC  . doxercalciferol  6 mcg Intravenous Q T,Th,Sa-HD  . glimepiride  2 mg Oral QAC breakfast  . insulin aspart  0-5 Units Subcutaneous QHS  . insulin aspart  0-9 Units Subcutaneous TID WC  . metoprolol tartrate  25 mg Oral BID  . midodrine  10 mg Oral Q T,Th,Sa-HD  . multivitamin  1 tablet Oral QHS  . simvastatin  40 mg Oral QHS  . sodium chloride  3 mL Intravenous Q12H  . sodium chloride  3 mL Intravenous Q12H  . Warfarin - Pharmacist Dosing Inpatient   Does not apply q1800     Assessment/ Plan:    1. Syncope- repeat ECHO shows he now has severe AS with an increased gradient.  Therefore would not use midodrine daily and will increase his EDW by 1kg. Afib/Aflutter- a flutter on EKG. Management per primary, rate controlled. Coumadin per pharmacy. INR 1.57. On tele.  2. ESRD - TTS @ AF. K+ 3.5. HD pending for tomorrow. 3. Chronic Hypotension/volume -  on midodrine 10mg  pre HD. No volume excess. 4. Anemia - hgb  15.9, no ESA outpt. Tsat 28 (9/25) 5. Metabolic bone disease - Ca+ 9.9 Phos 4.9. Pth 124. hectorol . Tums AC 6. Nutrition - Alb 4.0 (9/25) renal diet. Multi vit 7. DM- per primary. On glimepiride and aspart. CBG 158 8. AS- moderate at last ECHO, now severe, and presented with syncope. w/u per Dr. Tenny Craw.  Await eval by Dr. Excell Seltzer for possible endovascular repair as he was turned down for open repair 9.  Linkyn Gobin A 10/11/2012, 8:51 AM

## 2012-10-11 NOTE — Progress Notes (Signed)
[facility] PROGRESS NOTE  Alan [name] WUJ:[id number] DOB: 29-Nov-1931 DOA: 10/09/2012 PCP: Junious Silk, MD  Assessment/Plan: Principal Problem:   Syncope and collapse: Likely secondary to hypotension following hemodialysis. Cardiology consulted. He is noted to have grade 2 diastolic dysfunction along with severe aortic stenosis nonoperable candidate. We'll continue to monitor, cardiology recommendation is for trying to get off midodrine which can worsen his symptoms. We'll discuss with nephrology  Active Problems:   Aortic stenosis: Nonoperative candidate:   CAD (coronary artery disease)   Diabetes mellitus: Blood sugars stable: Continue monitoring.     Hypotension: Stable for now.   Atrial fibrillation: Patient currently in a flutter, rate controlled. On anticoagulation. INR to 1.75 today. INR slowly improving as per pharmacy.    Orthostatic hypotension: See above  Elevated troponin levels: No evidence of acute ischemia. May be related to hypotension and left ventricular strain in the setting of aortic stenosis. Continue aspirin and statin  Constipation: Tried suppository. Little effect. Will try fleets enema   Code Status: Full code  Family Communication: Discussed plan with patient and his wife at the bedside.  Disposition Plan: Home, possibly as early as tomorrow   Consultants:  Ross-cardiology  Coladonato-nephrology  Procedures:  None  Antibiotics:  None  HPI/Subjective: Patient doing okay. Tolerated dialysis. Only complaint is of constipation. Has not moved bowels in several days  Objective: Filed Vitals:   10/11/12 0900  BP: 91/41  Pulse: 80  Temp:   Resp: 12    Intake/Output Summary (Last 24 hours) at 10/11/12 0918 Last data filed at 10/10/12 1345  Gross per 24 hour  Intake    360 ml  Output      0 ml  Net    360 ml   Filed Weights   10/09/12 2223 10/11/12 0820  Weight: 92 kg (202 lb 13.2 oz) 95.4 kg (210 lb 5.1 oz)     Exam:   General:  Alert and oriented x3, no acute distress  Cardiovascular: Regular rate and rhythm, S1-S2, 2/6 systolic ejection murmur  Respiratory: Clear auscultation bilaterally  Abdomen: Soft, nontender, nondistended, hypoactive bowel sounds  Musculoskeletal: Status post bilateral BKA   Data Reviewed: Basic Metabolic Panel:  Recent Labs Lab 10/09/12 1845  NA 145  K 3.5  CL 99  CO2 29  GLUCOSE 213*  BUN 25*  CREATININE 6.00*  CALCIUM 9.9   CBC:  Recent Labs Lab 10/09/12 1845  WBC 7.8  NEUTROABS 5.4  HGB 15.9  HCT 46.2  MCV 94.1  PLT 168   Cardiac Enzymes:  Recent Labs Lab 10/09/12 1845 10/10/12 0055 10/10/12 0710 10/10/12 1240  TROPONINI <0.30 0.42* 0.53* 0.46*   BNP (last 3 results) No results found for this basename: PROBNP,  in the last 8760 hours CBG:  Recent Labs Lab 10/10/12 0712 10/10/12 1222 10/10/12 1630 10/10/12 2102 10/11/12 0606  GLUCAP 93 158* 138* 157* 102*    Recent Results (from the past 240 hour(s))  MRSA PCR SCREENING     Status: None   Collection Time    10/10/12  8:54 AM      Result Value Range Status   MRSA by PCR NEGATIVE  NEGATIVE Final   Comment:            The GeneXpert MRSA Assay (FDA     approved for NASAL specimens     only), is one component of a     comprehensive MRSA colonization     surveillance program. It is not  intended to diagnose MRSA     infection nor to guide or     monitor treatment for     MRSA infections.     Studies: Dg Chest Port 1 View  10/09/2012   *RADIOLOGY REPORT*  Clinical Data: Syncope after dialysis  PORTABLE CHEST - 1 VIEW  Comparison: 01/23/2012  Findings: Heart size within normal limits.  Vascular pattern normal.  Lungs clear except for mild lower lobe atelectasis on the left.  The patient noted to be status post CABG.  IMPRESSION: No acute abnormalities   Original Report Authenticated By: Esperanza Heir, M.D.    Scheduled Meds: . allopurinol  150 mg Oral Daily   . aspirin EC  81 mg Oral Daily  . calcium carbonate  1 tablet Oral TID WC  . doxercalciferol  6 mcg Intravenous Q T,Th,Sa-HD  . glimepiride  2 mg Oral QAC breakfast  . heparin  3,400 Units Dialysis Once in dialysis  . insulin aspart  0-5 Units Subcutaneous QHS  . insulin aspart  0-9 Units Subcutaneous TID WC  . metoprolol tartrate  25 mg Oral BID  . midodrine  10 mg Oral Q T,Th,Sa-HD  . multivitamin  1 tablet Oral QHS  . simvastatin  40 mg Oral QHS  . sodium chloride  3 mL Intravenous Q12H  . sodium chloride  3 mL Intravenous Q12H  . Warfarin - Pharmacist Dosing Inpatient   Does not apply q1800   Continuous Infusions:   Principal Problem:   Syncope and collapse Active Problems:   Aortic stenosis   CAD (coronary artery disease)   Diabetes mellitus   Chronic anticoagulation   Hypotension   Atrial fibrillation   Orthostatic hypotension    Time spent: 20 minutes    [name]  [facility] Pager [phone]. If 7PM-7AM, please contact night-coverage at [email or web address], password [facility] 10/11/2012, 9:18 AM  LOS: 2 days

## 2012-10-11 NOTE — Progress Notes (Signed)
Patient's SBP remains soft (<100). Text paged M.Lynch for parameters on Metoprolol. Will continue to monitor. Troy Sine

## 2012-10-11 NOTE — Procedures (Signed)
Patient was seen on dialysis and the procedure was supervised. BFR 400 Via RUE AVF BP is 110/66.  Patient appears to be tolerating treatment well  

## 2012-10-11 NOTE — Progress Notes (Signed)
Patient ID: Alan [name], male   DOB: 1931-02-11, 79 y.o.   MRN: [id number]      Subjective:   No complaints this morning. Patient is on HD this morning, tolerating.     Objective:   Temp:  [97.6 F (36.4 C)-98.3 F (36.8 C)] 98.3 F (36.8 C) (10/04 0820) Pulse Rate:  [52-81] 73 (10/04 1030) Resp:  [12-20] 14 (10/04 1030) BP: (91-114)/(34-66) 112/46 mmHg (10/04 1030) SpO2:  [97 %-100 %] 99 % (10/04 0720) Weight:  [210 lb 5.1 oz (95.4 kg)] 210 lb 5.1 oz (95.4 kg) (10/04 0820) Last BM Date: 10/09/12  Filed Weights   10/09/12 2223 10/11/12 0820  Weight: 202 lb 13.2 oz (92 kg) 210 lb 5.1 oz (95.4 kg)    Intake/Output Summary (Last 24 hours) at 10/11/12 1045 Last data filed at 10/10/12 1345  Gross per 24 hour  Intake    360 ml  Output      0 ml  Net    360 ml    Exam:  General: NAD  Resp: CTAB  Cardiac:RRR, 3/6 systolic murmur RUSB mid to late peaking  ZH:YQMVHQI soft, NT, ND  MSK: warm, no edema. Bilateral BKA.  Psych: appropriate affect  Lab Results:  Basic Metabolic Panel:  Recent Labs Lab 10/09/12 1845 10/11/12 0915  NA 145 134*  K 3.5 4.4  CL 99 92*  CO2 29 23  GLUCOSE 213* 194*  BUN 25* 53*  CREATININE 6.00* 9.62*  CALCIUM 9.9 9.2    Liver Function Tests:  Recent Labs Lab 10/11/12 0915  ALBUMIN 3.5    CBC:  Recent Labs Lab 10/09/12 1845 10/11/12 0915  WBC 7.8 9.4  HGB 15.9 13.5  HCT 46.2 39.8  MCV 94.1 94.1  PLT 168 166    Cardiac Enzymes:  Recent Labs Lab 10/10/12 0055 10/10/12 0710 10/10/12 1240  TROPONINI 0.42* 0.53* 0.46*    BNP: No results found for this basename: PROBNP,  in the last 8760 hours  Coagulation:  Recent Labs Lab 10/09/12 1845 10/10/12 0935 10/11/12 0510  INR 1.71* 1.57* 1.75*     Medications:   Scheduled Medications: . allopurinol  150 mg Oral Daily  . aspirin EC  81 mg Oral Daily  . calcium carbonate  1 tablet Oral TID WC  . doxercalciferol  6 mcg Intravenous Q T,Th,Sa-HD    . glimepiride  2 mg Oral QAC breakfast  . insulin aspart  0-5 Units Subcutaneous QHS  . insulin aspart  0-9 Units Subcutaneous TID WC  . metoprolol tartrate  25 mg Oral BID  . midodrine  10 mg Oral Q T,Th,Sa-HD  . multivitamin  1 tablet Oral QHS  . simvastatin  40 mg Oral QHS  . sodium chloride  3 mL Intravenous Q12H  . sodium chloride  3 mL Intravenous Q12H  . warfarin  5 mg Oral ONCE-1800  . Warfarin - Pharmacist Dosing Inpatient   Does not apply q1800     Infusions:     PRN Medications:  sodium chloride, sodium chloride, sodium chloride, alteplase, feeding supplement (NEPRO CARB STEADY), heparin, lidocaine (PF), lidocaine-prilocaine, pentafluoroprop-tetrafluoroeth, sodium chloride   10/10/12 Echo: LVEF 60-65%, no WMA, grade II diastolic dysfunction, AVA 0.8, mean grad 39 mmHg  Assessment/Plan   79 year old male with prior history of CAD, PND, and severe aortic stenosis who presented to the hospital yesterday following a syncopal episode and has been found to have a mildly elevated troponin.   1. Syncope: - episode occurred after a dialysis session,  patient with severe aortic stenosis. Episode after occurred after exerting himself, seems possibly related to hypovolemia with decreased preload in the setting of a severe fixed outflow obsturction from his AS. Patient also with grade II diastolic dysfunction, which exacerbates his ability to fill his LV and maintain CO even more so in the setting of hypovolemia. He is also on midodrine which can worsen these chronic cardiac problems.  - patient undergoing HD today, follow hemodynamics and symptoms   2. Elevated troponin - mild elevation, EKG without evidence of acute ischemia. Patient denies any chest pain. LVEF is unchanged with no new wall motion abnormalities.  - trops pending down, likely related to hypotension an LV strain in setting of AS - continue ASA and statin   3. Aflutter/fib: Rate controlled. Continue  anticoagulation.   4. ESRD: - patient to continue HD per renal - recommend trying to get him off midodrine if possible due to his severe aortic stenosis   5. Severe AS: - multiple medical comorbidities, would be high risk for any form of procedure - defer such decision to his primary cardiologist       Dina Rich, M.D., F.A.C.C.

## 2012-10-12 DIAGNOSIS — R4182 Altered mental status, unspecified: Secondary | ICD-10-CM

## 2012-10-12 LAB — HEMOGLOBIN A1C
Hgb A1c MFr Bld: 6.5 % — ABNORMAL HIGH (ref <5.7)
Mean Plasma Glucose: 140 mg/dL — ABNORMAL HIGH (ref <117)

## 2012-10-12 LAB — PROTIME-INR
INR: 1.97 — ABNORMAL HIGH (ref 0.00–1.49)
Prothrombin Time: 21.8 seconds — ABNORMAL HIGH (ref 11.6–15.2)

## 2012-10-12 LAB — CBC WITH DIFFERENTIAL/PLATELET
Basophils Absolute: 0 10*3/uL (ref 0.0–0.1)
Basophils Relative: 0 % (ref 0–1)
Eosinophils Relative: 1 % (ref 0–5)
HCT: 41.7 % (ref 39.0–52.0)
Hemoglobin: 14.2 g/dL (ref 13.0–17.0)
Lymphs Abs: 1.4 10*3/uL (ref 0.7–4.0)
MCHC: 34.1 g/dL (ref 30.0–36.0)
MCV: 93.9 fL (ref 78.0–100.0)
Monocytes Absolute: 1.3 10*3/uL — ABNORMAL HIGH (ref 0.1–1.0)
Monocytes Relative: 11 % (ref 3–12)
Neutrophils Relative %: 76 % (ref 43–77)
RDW: 13.9 % (ref 11.5–15.5)

## 2012-10-12 LAB — COMPREHENSIVE METABOLIC PANEL
ALT: 19 U/L (ref 0–53)
AST: 31 U/L (ref 0–37)
Albumin: 3.6 g/dL (ref 3.5–5.2)
Alkaline Phosphatase: 72 U/L (ref 39–117)
BUN: 37 mg/dL — ABNORMAL HIGH (ref 6–23)
CO2: 26 mEq/L (ref 19–32)
Calcium: 9.3 mg/dL (ref 8.4–10.5)
Chloride: 91 mEq/L — ABNORMAL LOW (ref 96–112)
Creatinine, Ser: 7.61 mg/dL — ABNORMAL HIGH (ref 0.50–1.35)
GFR calc non Af Amer: 6 mL/min — ABNORMAL LOW (ref >90)
Glucose, Bld: 121 mg/dL — ABNORMAL HIGH (ref 70–99)
Sodium: 136 mEq/L (ref 135–145)
Total Bilirubin: 0.7 mg/dL (ref 0.3–1.2)

## 2012-10-12 LAB — GLUCOSE, CAPILLARY
Glucose-Capillary: 174 mg/dL — ABNORMAL HIGH (ref 70–99)
Glucose-Capillary: 131 mg/dL — ABNORMAL HIGH (ref 70–99)

## 2012-10-12 MED ORDER — HYDROCORTISONE 2.5 % RE CREA
TOPICAL_CREAM | Freq: Two times a day (BID) | RECTAL | Status: DC
  Administered 2012-10-12 – 2012-10-15 (×5): via RECTAL
  Filled 2012-10-12: qty 28.35

## 2012-10-12 MED ORDER — WARFARIN SODIUM 5 MG PO TABS
5.0000 mg | ORAL_TABLET | Freq: Once | ORAL | Status: AC
  Administered 2012-10-12: 18:00:00 5 mg via ORAL
  Filled 2012-10-12: qty 1

## 2012-10-12 NOTE — Progress Notes (Signed)
Patient ID: Alan [name], male   DOB: Sep 13, 1931, 79 y.o.   MRN: [id number]      Primary cardiologist:  Subjective:   Patient has no complaints this morning.    Objective:   Temp:  [97.4 F (36.3 C)-98.6 F (37 C)] 98.3 F (36.8 C) (10/05 3086) Pulse Rate:  [62-99] 83 (10/05 0938) Resp:  [12-18] 18 (10/05 0637) BP: (64-112)/(35-80) 100/35 mmHg (10/05 0938) SpO2:  [98 %-99 %] 99 % (10/05 0637) Weight:  [208 lb 12.4 oz (94.7 kg)-208 lb 15.9 oz (94.8 kg)] 208 lb 15.9 oz (94.8 kg) (10/05 0637) Last BM Date: 10/09/12  Filed Weights   10/11/12 0820 10/11/12 1238 10/12/12 0637  Weight: 210 lb 5.1 oz (95.4 kg) 208 lb 12.4 oz (94.7 kg) 208 lb 15.9 oz (94.8 kg)    Intake/Output Summary (Last 24 hours) at 10/12/12 1016 Last data filed at 10/11/12 2230  Gross per 24 hour  Intake    480 ml  Output    700 ml  Net   -220 ml    Exam:  General: NAD   Resp: CTAB   Cardiac:RRR, 3/6 systolic murmur RUSB mid to late peaking   VH:QIONGEX soft, NT, ND   MSK: warm, no edema. Bilateral BKA.   Psych: appropriate affect   Lab Results:  Basic Metabolic Panel:  Recent Labs Lab 10/09/12 1845 10/11/12 0915  NA 145 134*  K 3.5 4.4  CL 99 92*  CO2 29 23  GLUCOSE 213* 194*  BUN 25* 53*  CREATININE 6.00* 9.62*  CALCIUM 9.9 9.2    Liver Function Tests:  Recent Labs Lab 10/11/12 0915  ALBUMIN 3.5    CBC:  Recent Labs Lab 10/09/12 1845 10/11/12 0915  WBC 7.8 9.4  HGB 15.9 13.5  HCT 46.2 39.8  MCV 94.1 94.1  PLT 168 166    Cardiac Enzymes:  Recent Labs Lab 10/10/12 0055 10/10/12 0710 10/10/12 1240  TROPONINI 0.42* 0.53* 0.46*    BNP: No results found for this basename: PROBNP,  in the last 8760 hours  Coagulation:  Recent Labs Lab 10/10/12 0935 10/11/12 0510 10/12/12 0514  INR 1.57* 1.75* 1.97*     Medications:   Scheduled Medications: . allopurinol  150 mg Oral Daily  . aspirin EC  81 mg Oral Daily  . calcium carbonate  1 tablet  Oral TID WC  . doxercalciferol  6 mcg Intravenous Q T,Th,Sa-HD  . glimepiride  2 mg Oral QAC breakfast  . insulin aspart  0-5 Units Subcutaneous QHS  . insulin aspart  0-9 Units Subcutaneous TID WC  . metoprolol tartrate  25 mg Oral BID  . midodrine  10 mg Oral Q T,Th,Sa-HD  . multivitamin  1 tablet Oral QHS  . simvastatin  40 mg Oral QHS  . sodium chloride  3 mL Intravenous Q12H  . sodium chloride  3 mL Intravenous Q12H  . Warfarin - Pharmacist Dosing Inpatient   Does not apply q1800     Infusions:     PRN Medications:  sodium chloride, acetaminophen, bisacodyl, sodium chloride    06/2011 Findings:  Aortogram: The visualized portions of the suprarenal abdominal aorta showed no significant stenosis. There is no evidence of renal artery stenosis. The infrarenal abdominal aorta is widely patent without stenosis. Bilateral common external and internal iliac arteries are widely patent.  Right Lower Extremity: The right common femoral artery is widely patent. The right profunda femoral artery is widely patent. The right superficial femoral artery is patent throughout  it's course. The right popliteal artery is patent throughout it's course. There is occlusion of all 3 tibial vessels with reconstitution of the peroneal artery which then occludes again. Mainly collaterals was visualized within the right leg.  Left Lower Extremity: The left common femoral artery is widely patent. The left profunda femoral artery is widely patent. The left superficial femoral artery is widely patent. The left popliteal artery is patent throughout it's course. There is occlusion of all 3 tibial vessels. There is delayed reconstitution of the posterior tibial and peroneal artery however he also reoccluded at the ankle.  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  #3 an attempt at opening his) renal artery could be done for limb salvage however the likelihood of a durable outcome is not very high due to his distal disease.   Assessment/Plan    1. Syncope:  - episode occurred after a dialysis session, patient with severe aortic stenosis. Episode after occurred after exerting himself, seems possibly related to hypovolemia with decreased preload in the setting of a severe fixed outflow obsturction from his AS. Patient also with grade II diastolic dysfunction, which exacerbates his ability to fill his LV and maintain CO even more so in the setting of hypovolemia. He is also on midodrine which can worsen these chronic cardiac problems.  - patient HD yesterday with low BPs, no repeat episode of syncope. Continues to require midodrine.   2. Elevated troponin  - mild elevation, EKG without evidence of acute ischemia. Patient denies any chest pain. LVEF is unchanged with no new wall motion abnormalities.  - trops pending down, likely related to hypotension an LV strain in setting of AS  - continue ASA and statin   3. Aflutter/fib: Rate controlled. Continue anticoagulation.   4. ESRD:  - patient to continue HD per renal  - recommend trying to get him off midodrine if possible due to his severe aortic stenosis   5. Severe AS:  - multiple medical comorbidities, would be high risk for any form of procedure given ESRD, hx of CAD with prior CABG, PAD, and overall deconditioning - of note his PAD from prior angiography appears to have been all infrapopliteal, thus he does not seem to not qualify for a catheter based intervention from this standpoint - recommend discussing case with interventional cards on Monday, if not a candidate for either TAVR or palliative valvuloplasty then consider overall goals of care. His hypotension during dialysis will only progress as his AS progresses.        Dina Rich, M.D., F.A.C.C.

## 2012-10-12 NOTE — Progress Notes (Signed)
Patient confused and trying to get OOB. No other neuro deficits noted. Vitals WNL. T.callahan text paged, bed alarm in use. Will continue to monitor. Alan [name]

## 2012-10-12 NOTE — Progress Notes (Addendum)
[facility] PROGRESS NOTE  Alan [name] AYT:[id number] DOB: 02/03/1931 DOA: 10/09/2012 PCP: Junious Silk, MD  Assessment/Plan: Principal Problem:   Syncope and collapse: Likely secondary to hypotension following hemodialysis. Cardiology consulted. He is noted to have grade 2 diastolic dysfunction along with severe aortic stenosis nonoperable candidate. Nephrology and cardiology agreed that he should be off midodrine, however right now is not able to tolerate this. His aortic stenosis only continued to get worse leading to more episodes of syncope. Given that he is a nonoperable candidate, recommendation is for palliative valvuloplasty which will be discussed with interventional cardiology tomorrow. If he is not felt to be candidate for this, will consult palliative care for goals of care meeting.  Active Problems:   Aortic stenosis: Nonoperative candidate: See above.   CAD (coronary artery disease)   Diabetes mellitus: Blood sugars stable: Continue monitoring.     Hypotension: Stable for now.   Atrial fibrillation: Patient currently in a flutter, rate controlled. On anticoagulation. I INR near therapeutic    Orthostatic hypotension: See above  Elevated troponin levels: No evidence of acute ischemia. May be related to hypotension and left ventricular strain in the setting of aortic stenosis. Continue aspirin and statin  Constipation: Responded to fleets enema  Acute mild delirium:? Sundowning although it seems to persist during the day. He's not severely agitated. Vital signs are stable. Nonetheless, we'll check lab work looking for signs of infection. We'll also check ABG looking for possible hypoxia. Does have history of atrial fibrillation but has no focal neurological deficits so we'll hold off on CT scan for now. Will also check A1c to rule out episodes of hypoglycemia. 8 months ago, it was borderline low. He is on glipizide   Code Status: Full code  Family  Communication: Discussed plan with patient and his wife at the bedside.  Disposition Plan: Home in a few days, once long-term plan is discussed and decided   Consultants:  Ross-cardiology  Coladonato-nephrology  Procedures:  None  Antibiotics:  None  HPI/Subjective: Patient doing okay. Moved bowels. Wife informs me that earlier he did not know where he was and thought that he was home. Today, he tells me that he is at the hospital, but then asked if I was [facility] Gainer, the person.  Objective: Filed Vitals:   10/12/12 0938  BP: 100/35  Pulse: 83  Temp:   Resp:     Intake/Output Summary (Last 24 hours) at 10/12/12 1303 Last data filed at 10/12/12 1123  Gross per 24 hour  Intake    840 ml  Output      0 ml  Net    840 ml   Filed Weights   10/11/12 0820 10/11/12 1238 10/12/12 0637  Weight: 95.4 kg (210 lb 5.1 oz) 94.7 kg (208 lb 12.4 oz) 94.8 kg (208 lb 15.9 oz)    Exam:   General:  Alert and oriented x2, no acute distress-earlier he was oriented x1  Cardiovascular: Regular rate and rhythm, S1-S2, 2/6 systolic ejection murmur  Respiratory: Clear auscultation bilaterally  Abdomen: Soft, nontender, nondistended, hypoactive bowel sounds  Musculoskeletal: Status post bilateral BKA   Data Reviewed: Basic Metabolic Panel:  Recent Labs Lab 10/09/12 1845 10/11/12 0915  NA 145 134*  K 3.5 4.4  CL 99 92*  CO2 29 23  GLUCOSE 213* 194*  BUN 25* 53*  CREATININE 6.00* 9.62*  CALCIUM 9.9 9.2  PHOS  --  5.2*   CBC:  Recent Labs Lab 10/09/12 1845 10/11/12 0915  WBC 7.8 9.4  NEUTROABS 5.4  --   HGB 15.9 13.5  HCT 46.2 39.8  MCV 94.1 94.1  PLT 168 166   Cardiac Enzymes:  Recent Labs Lab 10/09/12 1845 10/10/12 0055 10/10/12 0710 10/10/12 1240  TROPONINI <0.30 0.42* 0.53* 0.46*   CBG:  Recent Labs Lab 10/11/12 1322 10/11/12 1623 10/11/12 2052 10/12/12 0604 10/12/12 1059  GLUCAP 94 188* 123* 171* 174*    Recent Results (from the past 240  hour(s))  MRSA PCR SCREENING     Status: None   Collection Time    10/10/12  8:54 AM      Result Value Range Status   MRSA by PCR NEGATIVE  NEGATIVE Final   Comment:            The GeneXpert MRSA Assay (FDA     approved for NASAL specimens     only), is one component of a     comprehensive MRSA colonization     surveillance program. It is not     intended to diagnose MRSA     infection nor to guide or     monitor treatment for     MRSA infections.     Studies: No results found.  Scheduled Meds: . allopurinol  150 mg Oral Daily  . aspirin EC  81 mg Oral Daily  . calcium carbonate  1 tablet Oral TID WC  . doxercalciferol  6 mcg Intravenous Q T,Th,Sa-HD  . glimepiride  2 mg Oral QAC breakfast  . hydrocortisone   Rectal BID  . insulin aspart  0-5 Units Subcutaneous QHS  . insulin aspart  0-9 Units Subcutaneous TID WC  . metoprolol tartrate  25 mg Oral BID  . midodrine  10 mg Oral Q T,Th,Sa-HD  . multivitamin  1 tablet Oral QHS  . simvastatin  40 mg Oral QHS  . sodium chloride  3 mL Intravenous Q12H  . sodium chloride  3 mL Intravenous Q12H  . warfarin  5 mg Oral ONCE-1800  . Warfarin - Pharmacist Dosing Inpatient   Does not apply q1800   Continuous Infusions:   Principal Problem:   Syncope and collapse Active Problems:   Aortic stenosis   CAD (coronary artery disease)   Diabetes mellitus   Chronic anticoagulation   Hypotension   Atrial fibrillation   Orthostatic hypotension   Unspecified constipation    Time spent: 25 minutes    [name]  [facility] Pager [phone]. If 7PM-7AM, please contact night-coverage at [email or web address], password [facility] 10/12/2012, 1:03 PM  LOS: 3 days

## 2012-10-12 NOTE — Progress Notes (Signed)
ANTICOAGULATION CONSULT NOTE - Initial Consult  Pharmacy Consult for coumadin Indication: atrial fibrillation  Allergies  Allergen Reactions  . Ancef [Cefazolin] Rash    Patient cant recall if allergy to this med exists.     Patient Measurements: Height: 6\' 1"  (185.4 cm) (with bilateral prosthesis) Weight: 208 lb 15.9 oz (94.8 kg) (BEDSCALE) IBW/kg (Calculated) : 79.9  Vital Signs: Temp: 98.3 F (36.8 C) (10/05 0637) Temp src: Oral (10/05 0637) BP: 100/35 mmHg (10/05 0938) Pulse Rate: 83 (10/05 0938)  Labs:  Recent Labs  10/09/12 1845 10/10/12 0055 10/10/12 0710 10/10/12 0935 10/10/12 1240 10/11/12 0510 10/11/12 0915 10/12/12 0514  HGB 15.9  --   --   --   --   --  13.5  --   HCT 46.2  --   --   --   --   --  39.8  --   PLT 168  --   --   --   --   --  166  --   LABPROT 19.6*  --   --  18.3*  --  19.9*  --  21.8*  INR 1.71*  --   --  1.57*  --  1.75*  --  1.97*  CREATININE 6.00*  --   --   --   --   --  9.62*  --   TROPONINI <0.30 0.42* 0.53*  --  0.46*  --   --   --     Estimated Creatinine Clearance: 7.2 ml/min (by C-G formula based on Cr of 9.62).  Assessment: 79 yo male here with syncopal episode. Continued on coumadin for afib. INR 1.97 trending up close to goal. No bleeding noted per chart.  Home coumadin regimen: 5mg /day except 2.5mg  on Wed (last dose 10/08/2012)  Goal of Therapy:  INR 2-3 Monitor platelets by anticoagulation protocol: Yes   Plan:  -Coumadin 5mg  po today -Daily PT/INR  Bayard Hugger, PharmD, BCPS  Clinical Pharmacist  Pager: [phone]   10/12/2012 11:12 AM

## 2012-10-12 NOTE — Progress Notes (Signed)
Patient ID: Alan [name], male   DOB: 05-Jun-1931, 79 y.o.   MRN: [id number]  [name] Progress Note    Subjective:   Feels well and did well on HD yesterday   Objective:   BP 100/35  Pulse 83  Temp(Src) 98.3 F (36.8 C) (Oral)  Resp 18  Ht 6\' 1"  (1.854 m)  Wt 94.8 kg (208 lb 15.9 oz)  BMI 27.58 kg/m2  SpO2 99%  Intake/Output: I/O last 3 completed shifts: In: 480 [P.O.:480] Out: 700 [Other:700]   Intake/Output this shift:  Total I/O In: 360 [P.O.:360] Out: -  Weight change:   Physical Exam: Gen:WD elderly AAM in NAD  CVS:RRR with III/VI SEM  Resp:cta no rales Abd:+BS, soft, NT  Ext:s/p bilateral BKA's, RUE AVF +T/B   Labs: BMET  Recent Labs Lab 10/09/12 1845 10/11/12 0915  NA 145 134*  K 3.5 4.4  CL 99 92*  CO2 29 23  GLUCOSE 213* 194*  BUN 25* 53*  CREATININE 6.00* 9.62*  ALBUMIN  --  3.5  CALCIUM 9.9 9.2  PHOS  --  5.2*   CBC  Recent Labs Lab 10/09/12 1845 10/11/12 0915  WBC 7.8 9.4  NEUTROABS 5.4  --   HGB 15.9 13.5  HCT 46.2 39.8  MCV 94.1 94.1  PLT 168 166    @IMGRELPRIORS @ Medications:    . allopurinol  150 mg Oral Daily  . aspirin EC  81 mg Oral Daily  . calcium carbonate  1 tablet Oral TID WC  . doxercalciferol  6 mcg Intravenous Q T,Th,Sa-HD  . glimepiride  2 mg Oral QAC breakfast  . insulin aspart  0-5 Units Subcutaneous QHS  . insulin aspart  0-9 Units Subcutaneous TID WC  . metoprolol tartrate  25 mg Oral BID  . midodrine  10 mg Oral Q T,Th,Sa-HD  . multivitamin  1 tablet Oral QHS  . simvastatin  40 mg Oral QHS  . sodium chloride  3 mL Intravenous Q12H  . sodium chloride  3 mL Intravenous Q12H  . warfarin  5 mg Oral ONCE-1800  . Warfarin - Pharmacist Dosing Inpatient   Does not apply q1800     Assessment/ Plan:   1. Syncope- repeat ECHO shows he now has severe AS with an increased gradient. Therefore would not use midodrine daily and will increase his EDW by 1kg.  2. Severe AS- appreciate  cardiology input.  To d/w interventional cards about possible TAVR or palliative valvuloplasty.  Agree that if nothing can be done for severe AS, need to address GOC with family as HD is challenging with his worsening AS and hypotension. 3. Afib/Aflutter- a flutter on EKG. Management per primary, rate controlled. Coumadin per pharmacy. INR 1.57. On tele.  4. ESRD - TTS @ AF. K+ 3.5. HD pending for tomorrow. Increased EDW by 1kg 5. Chronic Hypotension/volume - on midodrine 10mg  pre HD only. No volume excess. 6. Anemia - hgb 15.9, no ESA outpt. Tsat 28 (9/25) 7. Metabolic bone disease - Ca+ 9.9 Phos 4.9. Pth 124. hectorol . Tums AC 8. Nutrition - Alb 4.0 (9/25) renal diet. Multi vit 9. DM- per primary. On glimepiride and aspart. CBG 158 10. Dispo- as above, pending interventional cardiology eval 11.   Alan [name] A 10/12/2012, 11:25 AM

## 2012-10-13 LAB — PROTIME-INR
INR: 2.74 — ABNORMAL HIGH (ref 0.00–1.49)
Prothrombin Time: 28.1 seconds — ABNORMAL HIGH (ref 11.6–15.2)

## 2012-10-13 LAB — GLUCOSE, CAPILLARY: Glucose-Capillary: 138 mg/dL — ABNORMAL HIGH (ref 70–99)

## 2012-10-13 MED ORDER — WARFARIN SODIUM 2.5 MG PO TABS
2.5000 mg | ORAL_TABLET | Freq: Once | ORAL | Status: AC
  Administered 2012-10-13: 19:00:00 2.5 mg via ORAL
  Filled 2012-10-13: qty 1

## 2012-10-13 NOTE — Progress Notes (Signed)
ANTICOAGULATION CONSULT NOTE - Follow Up Consult  Pharmacy Consult for warfarin Indication: atrial fibrillation  Allergies  Allergen Reactions  . Ancef [Cefazolin] Rash    Patient cant recall if allergy to this med exists.     Patient Measurements: Height: 6\' 1"  (185.4 cm) (with bilateral prosthesis) Weight: 206 lb 9.1 oz (93.7 kg) (BEDSCALE) IBW/kg (Calculated) : 79.9  Vital Signs: Temp: 98.6 F (37 C) (10/06 0832) Temp src: Oral (10/06 0832) BP: 100/43 mmHg (10/06 0832) Pulse Rate: 82 (10/06 0832)  Labs:  Recent Labs  10/10/12 1240 10/11/12 0510 10/11/12 0915 10/12/12 0514 10/12/12 1600 10/13/12 1005  HGB  --   --  13.5  --  14.2  --   HCT  --   --  39.8  --  41.7  --   PLT  --   --  166  --  158  --   LABPROT  --  19.9*  --  21.8*  --  28.1*  INR  --  1.75*  --  1.97*  --  2.74*  CREATININE  --   --  9.62*  --  7.61*  --   TROPONINI 0.46*  --   --   --   --   --     Estimated Creatinine Clearance: 9 ml/min (by C-G formula based on Cr of 7.61).  Assessment: 79 yo male here with syncopal episode during HD. On warfarin PTA for Afib- home dose is 5mg  daily except 2.5mg  on Wednesdays. INR today increased drastically to 2.74. No bleeding noted. CBC is stable.  Goal of Therapy:  INR 2-3 Monitor platelets by anticoagulation protocol: Yes   Plan:  1. Coumadin 2.5mg  po today 2. Daily PT/INR  Eion Timbrook D. Channing Yeager, PharmD Clinical Pharmacist Pager: [phone] 10/13/2012 11:49 AM

## 2012-10-13 NOTE — Progress Notes (Signed)
Patient ID: Alan [name], male   DOB: [birth year], 79 y.o.   MRN: [id number]  [facility] Progress Note    Subjective:   No new complaints   Objective:   BP 100/43  Pulse 82  Temp(Src) 98.6 F (37 C) (Oral)  Resp 18  Ht 6\' 1"  (1.854 m)  Wt 93.7 kg (206 lb 9.1 oz)  BMI 27.26 kg/m2  SpO2 98%  Intake/Output: I/O last 3 completed shifts: In: 820 [P.O.:820] Out: -    Intake/Output this shift:  Total I/O In: 240 [P.O.:240] Out: -  Weight change:   Physical Exam: Gen:WD elderly AAM in NAD CVS:RRR with III/VI SEM Resp:cta Abd:+BS, soft, NT Ext:s/p bilateral BKA's, RUE AVF +T/B  Labs: BMET  Recent Labs Lab 10/09/12 1845 10/11/12 0915 10/12/12 1600  NA 145 134* 136  K 3.5 4.4 4.9  CL 99 92* 91*  CO2 29 23 26   GLUCOSE 213* 194* 121*  BUN 25* 53* 37*  CREATININE 6.00* 9.62* 7.61*  ALBUMIN  --  3.5 3.6  CALCIUM 9.9 9.2 9.3  PHOS  --  5.2*  --    CBC  Recent Labs Lab 10/09/12 1845 10/11/12 0915 10/12/12 1600  WBC 7.8 9.4 11.3*  NEUTROABS 5.4  --  8.6*  HGB 15.9 13.5 14.2  HCT 46.2 39.8 41.7  MCV 94.1 94.1 93.9  PLT 168 166 158    @IMGRELPRIORS @ Medications:    . allopurinol  150 mg Oral Daily  . aspirin EC  81 mg Oral Daily  . calcium carbonate  1 tablet Oral TID WC  . doxercalciferol  6 mcg Intravenous Q T,Th,Sa-HD  . glimepiride  2 mg Oral QAC breakfast  . hydrocortisone   Rectal BID  . insulin aspart  0-5 Units Subcutaneous QHS  . insulin aspart  0-9 Units Subcutaneous TID WC  . metoprolol tartrate  25 mg Oral BID  . midodrine  10 mg Oral Q T,Th,Sa-HD  . multivitamin  1 tablet Oral QHS  . simvastatin  40 mg Oral QHS  . sodium chloride  3 mL Intravenous Q12H  . sodium chloride  3 mL Intravenous Q12H  . Warfarin - Pharmacist Dosing Inpatient   Does not apply q1800  Dialysis Orders: TTS @ [facility]  96.5 Kg 2K/2.25 Ca 4hr 15 min 3400 u Heparin RUA AVF 500/1.5 AF  Hectorol 6 mcg IV/HD No epo/ no  venofer    Assessment/ Plan:    1. Syncope / severe AS / chronic hypotension- using midodrine only on HD days.  Poor outlook with ESRD, severe PVD, severe AS 2. Afib/Aflutter- a flutter on EKG. Management per primary, rate controlled. Coumadin per pharmacy. INR 1.57. On tele.  3. ESRD, cont TTS HD, increased EDW 1kg, he is well below dry wt, keep even w HD tues 4. Anemia - hgb 15.9, no ESA outpt. Tsat 28 (9/25) 5. Metabolic bone disease - Ca+ 9.9 Phos 4.9. Pth 124. hectorol . Tums AC 6. Nutrition - Alb 4.0 (9/25) renal diet. Multi vit 7. DM- per primary. On glimepiride and aspart. CBG 158   Jamekia Gannett D 10/13/2012, 10:01 AM

## 2012-10-13 NOTE — Progress Notes (Signed)
[facility] PROGRESS NOTE  Handy Mcloud Kelleher ZOX:[id number] DOB: 11/21/1931 DOA: 10/09/2012 PCP: Junious Silk, MD  Assessment/Plan: Principal Problem:   Syncope and collapse: Likely secondary to hypotension following hemodialysis. Cardiology consulted. He is noted to have grade 2 diastolic dysfunction along with severe aortic stenosis nonoperable candidate. Nephrology and cardiology agreed that he should be off midodrine, however right now is not able to tolerate this. His aortic stenosis only continued to get worse leading to more episodes of syncope. Given that he is a nonoperable candidate, recommendation is for palliative valvuloplasty which will be discussed with interventional cardiology. If he is not felt to be candidate for this, will consult palliative care for goals of care meeting. Cardiology saw this morning, will confirm that likely little can be done.  Active Problems:   Aortic stenosis: Nonoperative candidate: See above.   CAD (coronary artery disease)   Diabetes mellitus: Blood sugars stable: Continue monitoring.     Hypotension: Stable for now.   Atrial fibrillation: Patient currently in a flutter, rate controlled. On anticoagulation. I INR now therapeutic    Orthostatic hypotension: See above  Elevated troponin levels: No evidence of acute ischemia. May be related to hypotension and left ventricular strain in the setting of aortic stenosis. Continue aspirin and statin  Constipation: Responded to fleets enema  Acute mild delirium:? Sundowning although it seems to persist during the day. He's not severely agitated. Vital signs are stable. Nonetheless, we'll check lab work looking for signs of infection. We'll also check ABG looking for possible hypoxia. Does have history of atrial fibrillation but has no focal neurological deficits so we'll hold off on CT scan for now. Will also check A1c to rule out episodes of hypoglycemia. 8 months ago, it was borderline low.  He is on glipizide   Code Status: Full code  Family Communication: Discussed plan with patient and his son at the bedside  Disposition Plan: Home in a few days, once long-term plan is discussed and decided   Consultants:  Ross-cardiology  Coladonato-nephrology  Procedures:  None  Antibiotics:  None  HPI/Subjective: Patient doing much better today. Alert and oriented. Denies any shortness of breath or dizziness.  Objective: Filed Vitals:   10/13/12 0832  BP: 100/43  Pulse: 82  Temp: 98.6 F (37 C)  Resp: 18    Intake/Output Summary (Last 24 hours) at 10/13/12 0930 Last data filed at 10/13/12 8119  Gross per 24 hour  Intake    940 ml  Output      0 ml  Net    940 ml   Filed Weights   10/11/12 1238 10/12/12 0637 10/13/12 0714  Weight: 94.7 kg (208 lb 12.4 oz) 94.8 kg (208 lb 15.9 oz) 93.7 kg (206 lb 9.1 oz)    Exam:   General:  Alert and oriented x2, no acute distress  Cardiovascular: Regular rate and rhythm, S1-S2, 2/6 systolic ejection murmur  Respiratory: Clear auscultation bilaterally  Abdomen: Soft, nontender, nondistended, hypoactive bowel sounds  Musculoskeletal: Status post bilateral BKA   Data Reviewed: Basic Metabolic Panel:  Recent Labs Lab 10/09/12 1845 10/11/12 0915 10/12/12 1600  NA 145 134* 136  K 3.5 4.4 4.9  CL 99 92* 91*  CO2 29 23 26   GLUCOSE 213* 194* 121*  BUN 25* 53* 37*  CREATININE 6.00* 9.62* 7.61*  CALCIUM 9.9 9.2 9.3  PHOS  --  5.2*  --    CBC:  Recent Labs Lab 10/09/12 1845 10/11/12 0915 10/12/12 1600  WBC 7.8  9.4 11.3*  NEUTROABS 5.4  --  8.6*  HGB 15.9 13.5 14.2  HCT 46.2 39.8 41.7  MCV 94.1 94.1 93.9  PLT 168 166 158   Cardiac Enzymes:  Recent Labs Lab 10/09/12 1845 10/10/12 0055 10/10/12 0710 10/10/12 1240  TROPONINI <0.30 0.42* 0.53* 0.46*   CBG:  Recent Labs Lab 10/12/12 0604 10/12/12 1059 10/12/12 1612 10/12/12 2132 10/13/12 0611  GLUCAP 171* 174* 131* 128* 108*    Recent  Results (from the past 240 hour(s))  MRSA PCR SCREENING     Status: None   Collection Time    10/10/12  8:54 AM      Result Value Range Status   MRSA by PCR NEGATIVE  NEGATIVE Final   Comment:            The GeneXpert MRSA Assay (FDA     approved for NASAL specimens     only), is one component of a     comprehensive MRSA colonization     surveillance program. It is not     intended to diagnose MRSA     infection nor to guide or     monitor treatment for     MRSA infections.     Studies: No results found.  Scheduled Meds: . allopurinol  150 mg Oral Daily  . aspirin EC  81 mg Oral Daily  . calcium carbonate  1 tablet Oral TID WC  . doxercalciferol  6 mcg Intravenous Q T,Th,Sa-HD  . glimepiride  2 mg Oral QAC breakfast  . hydrocortisone   Rectal BID  . insulin aspart  0-5 Units Subcutaneous QHS  . insulin aspart  0-9 Units Subcutaneous TID WC  . metoprolol tartrate  25 mg Oral BID  . midodrine  10 mg Oral Q T,Th,Sa-HD  . multivitamin  1 tablet Oral QHS  . simvastatin  40 mg Oral QHS  . sodium chloride  3 mL Intravenous Q12H  . sodium chloride  3 mL Intravenous Q12H  . Warfarin - Pharmacist Dosing Inpatient   Does not apply q1800   Continuous Infusions:   Principal Problem:   Syncope and collapse Active Problems:   Aortic stenosis   CAD (coronary artery disease)   End stage renal disease on dialysis   Diabetes mellitus   Chronic anticoagulation   Hypotension   Atrial fibrillation   Orthostatic hypotension   Unspecified constipation   Altered mental status    Time spent: 20 minutes    [name]  [facility] Pager [phone]. If 7PM-7AM, please contact night-coverage at [email or web address], password [facility] 10/13/2012, 9:30 AM  LOS: 4 days

## 2012-10-13 NOTE — Evaluation (Signed)
Physical Therapy Evaluation Patient Details Name: Alan [name] MRN: [id number] DOB: 1931/11/10 Today's Date: 10/13/2012 Time: 4098-1191 PT Time Calculation (min): 48 min  PT Assessment / Plan / Recommendation History of Present Illness  Pt is a 79 y/o male admitted for syncope following dialysis.  Pt. with severe aortic stenosis.  Hx of bil. BKA.  Clinical Impression  Pt admitted with syncope. Pt currently with functional limitations due to the deficits listed below (see PT Problem List).  Pt will benefit from skilled PT to increase their independence and safety with mobility to allow discharge to the venue listed below.     PT Assessment  Patient needs continued PT services    Follow Up Recommendations  Home health PT;Supervision/Assistance - 24 hour    Does the patient have the potential to tolerate intense rehabilitation      Barriers to Discharge Decreased caregiver support wife unable to provide physical assist    Equipment Recommendations  None recommended by PT    Recommendations for Other Services     Frequency Min 3X/week    Precautions / Restrictions Precautions Precautions: Fall Restrictions Weight Bearing Restrictions: No   Pertinent Vitals/Pain C/o pain-"I think I need to have a bowel movement." Did not rate.      Mobility  Bed Mobility Bed Mobility: Rolling Right;Rolling Left;Right Sidelying to Sit;Sit to Sidelying Right Rolling Right: 3: Mod assist;With rail Rolling Left: 3: Mod assist;With rail Right Sidelying to Sit: 3: Mod assist;With rails Sit to Sidelying Right: 3: Mod assist;With rail;HOB flat Details for Bed Mobility Assistance: mod cues for technique Wheelchair Mobility Wheelchair Mobility: No    Exercises     PT Diagnosis: Difficulty walking;Generalized weakness  PT Problem List: Decreased strength;Decreased activity tolerance;Decreased balance;Decreased mobility;Decreased coordination;Decreased knowledge of use of DME;Decreased  safety awareness;Decreased knowledge of precautions PT Treatment Interventions: DME instruction;Gait training;Functional mobility training;Therapeutic exercise;Therapeutic activities;Neuromuscular re-education;Patient/family education     PT Goals(Current goals can be found in the care plan section) Acute Rehab PT Goals Patient Stated Goal: to go home PT Goal Formulation: With patient/family Time For Goal Achievement: 10/20/12 Potential to Achieve Goals: Good  Visit Information  Last PT Received On: 10/13/12 Assistance Needed: +2 History of Present Illness: Pt is a 79 y/o male admitted for syncope following dialysis.  Pt. with severe aortic stenosis.  Hx of bil. BKA.       Prior Functioning  Home Living Family/patient expects to be discharged to:: Private residence Living Arrangements: Spouse/significant other Available Help at Discharge: Family;Available 24 hours/day Type of Home: House Home Access: Ramped entrance Home Layout: One level Home Equipment: Walker - 2 wheels;Bedside commode;Wheelchair - Engineer, technical sales - power Prior Function Level of Independence: Needs assistance Gait / Transfers Assistance Needed: stand pivot transfers only; non-ambulatory ADL's / Homemaking Assistance Needed: mod A for ADLs Communication / Swallowing Assistance Needed: n/a Comments: Enjoys going to football games, watch TV Communication Communication: No difficulties Dominant Hand: Right    Cognition  Cognition Arousal/Alertness: Awake/alert Behavior During Therapy: WFL for tasks assessed/performed Overall Cognitive Status: Within Functional Limits for tasks assessed    Extremity/Trunk Assessment Upper Extremity Assessment Upper Extremity Assessment: Defer to OT evaluation Lower Extremity Assessment Lower Extremity Assessment: Generalized weakness   Balance Balance Balance Assessed: Yes Static Sitting Balance Static Sitting - Balance Support: Bilateral upper extremity  supported;Feet unsupported Static Sitting - Level of Assistance: 4: Min assist Static Sitting - Comment/# of Minutes: 10  End of Session PT - End of Session Activity Tolerance: Patient limited by  pain;Other (comment) (c/o pain in bowels upon sitting) Patient left: in bed;with call bell/phone within reach;with family/visitor present Nurse Communication: Mobility status  GP     Moshe Cipro K 10/13/2012, 3:07 PM  Clarita Crane, PT, DPT [phone]

## 2012-10-13 NOTE — Progress Notes (Signed)
TELEMETRY: Reviewed telemetry pt in atrial flutter with a controlled ventricular response.: Filed Vitals:   10/12/12 1300 10/12/12 2130 10/13/12 0714 10/13/12 0832  BP: 144/89 105/43 112/52 100/43  Pulse: 71 80 80 82  Temp: 98.2 F (36.8 C) 98.3 F (36.8 C) 98 F (36.7 C) 98.6 F (37 C)  TempSrc: Oral Oral Oral Oral  Resp: 19 18 16 18   Height:      Weight:   206 lb 9.1 oz (93.7 kg)   SpO2: 98% 97% 100% 98%    Intake/Output Summary (Last 24 hours) at 10/13/12 1154 Last data filed at 10/13/12 0828  Gross per 24 hour  Intake    580 ml  Output      0 ml  Net    580 ml    SUBJECTIVE Patient feels well this am. Still having hypotension with dialysis. No syncope in hospital. Son notes occasional confusion. Denies chest pain.  LABS: Basic Metabolic Panel:  Recent Labs  16/10/96 0915 10/12/12 1600  NA 134* 136  K 4.4 4.9  CL 92* 91*  CO2 23 26  GLUCOSE 194* 121*  BUN 53* 37*  CREATININE 9.62* 7.61*  CALCIUM 9.2 9.3  PHOS 5.2*  --    Liver Function Tests:  Recent Labs  10/11/12 0915 10/12/12 1600  AST  --  31  ALT  --  19  ALKPHOS  --  72  BILITOT  --  0.7  PROT  --  7.7  ALBUMIN 3.5 3.6   No results found for this basename: LIPASE, AMYLASE,  in the last 72 hours CBC:  Recent Labs  10/11/12 0915 10/12/12 1600  WBC 9.4 11.3*  NEUTROABS  --  8.6*  HGB 13.5 14.2  HCT 39.8 41.7  MCV 94.1 93.9  PLT 166 158   Cardiac Enzymes:  Recent Labs  10/10/12 1240  TROPONINI 0.46*   Hemoglobin A1C:  Recent Labs  10/12/12 1600  HGBA1C 6.5*   Radiology/Studies:  Dg Chest Port 1 View  10/09/2012   *RADIOLOGY REPORT*  Clinical Data: Syncope after dialysis  PORTABLE CHEST - 1 VIEW  Comparison: 01/23/2012  Findings: Heart size within normal limits.  Vascular pattern normal.  Lungs clear except for mild lower lobe atelectasis on the left.  The patient noted to be status post CABG.  IMPRESSION: No acute abnormalities   Original Report Authenticated By:  Esperanza Heir, M.D.   Echo:Study Conclusions  - Left ventricle: The cavity size was normal. Wall thickness was increased in a pattern of moderate LVH. Systolic function was normal. The estimated ejection fraction was in the range of 60% to 65%. Wall motion was normal; there were no regional wall motion abnormalities. Features are consistent with a pseudonormal left ventricular filling pattern, with concomitant abnormal relaxation and increased filling pressure (grade 2 diastolic dysfunction). - Aortic valve: AV is thickened, calcified with restricted motion. Peak and mean gradients across the valve are 59 and 39 consistent with severe AS SInce echo from earlier this year mean gradient is increased. Valve area: 0.78cm^2(VTI). Valve area: 0.77cm^2 (Vmax). - Mitral valve: Calcified annulus. Mildly thickened, mildly calcified leaflets .  Ecg: Atrial flutter with rate 80 bpm. Old inferior and septal infarcts. Low voltage.  PHYSICAL EXAM General: Well developed, elderly, in no acute distress. Head: Normal Neck: Negative for carotid bruits. JVD not elevated. Lungs: Clear bilaterally to auscultation without wheezes, rales, or rhonchi. Breathing is unlabored. Heart: RRR S1 S2 with grade 3/6 systolic murmur RUSB. Abdomen: Soft,  non-tender, non-distended with normoactive bowel sounds. Extremities: Bilateral BKAs Neuro: Alert and oriented X 3. Moves all extremities spontaneously.   ASSESSMENT AND PLAN: 1. Severe aortic stenosis. 2. CAD s/p CABG 1996. Recent troponin elevation probably due to demand ischemia. No clinical symptoms of angina. 3. ESRD on hemodialysis 4. Syncope/Hypotension secondary to preload reduction with dialysis in setting of severe AS. 5. DM on insulin 6. Diabetic neuropathy. 7. S/p bilateral BKAs. 8. PAD 9. Poor functional capacity. 10. Chronic atrial flutter with controlled ventricular response. 11. Chronic anticoagulation.   Plan: I had a long discussion  with Mr. Winkleman and his son. He is in frail health with very poor functional status. Given the above factors he is not a candidate for open heart surgery with AVR. This would be a redo surgery and I think his risk is unacceptable. We also discussed the possibility for TAVR. Once again I think his risk is high and I doubt that this would result in meaningful improvement in quality of life or longevity. I recommend continued medical management. He understands that there is little that we can offer now.   Principal Problem:   Syncope and collapse Active Problems:   Aortic stenosis   CAD (coronary artery disease)   End stage renal disease on dialysis   Diabetes mellitus   Chronic anticoagulation   Hypotension   Atrial fibrillation   Orthostatic hypotension   Unspecified constipation   Altered mental status    Signed, Judene Logue [place] MD,FACC 10/13/2012 5:15 PM

## 2012-10-14 LAB — PROTIME-INR
INR: 2.82 — ABNORMAL HIGH (ref 0.00–1.49)
Prothrombin Time: 28.7 seconds — ABNORMAL HIGH (ref 11.6–15.2)

## 2012-10-14 LAB — GLUCOSE, CAPILLARY
Glucose-Capillary: 122 mg/dL — ABNORMAL HIGH (ref 70–99)
Glucose-Capillary: 72 mg/dL (ref 70–99)
Glucose-Capillary: 81 mg/dL (ref 70–99)

## 2012-10-14 LAB — RENAL FUNCTION PANEL
CO2: 26 mEq/L (ref 19–32)
Calcium: 8.5 mg/dL (ref 8.4–10.5)
Creatinine, Ser: 5.43 mg/dL — ABNORMAL HIGH (ref 0.50–1.35)
Glucose, Bld: 76 mg/dL (ref 70–99)
Phosphorus: 3.1 mg/dL (ref 2.3–4.6)
Sodium: 137 mEq/L (ref 135–145)

## 2012-10-14 MED ORDER — SODIUM CHLORIDE 0.9 % IV SOLN: 100.0000 mL | INTRAVENOUS | Status: DC | PRN

## 2012-10-14 MED ORDER — NEPRO/CARBSTEADY PO LIQD
237.0000 mL | ORAL | Status: DC | PRN
  Filled 2012-10-14: qty 237

## 2012-10-14 MED ORDER — LIDOCAINE-PRILOCAINE 2.5-2.5 % EX CREA: 1.0000 "application " | TOPICAL_CREAM | CUTANEOUS | Status: DC | PRN

## 2012-10-14 MED ORDER — WARFARIN SODIUM 2.5 MG PO TABS
2.5000 mg | ORAL_TABLET | Freq: Once | ORAL | Status: AC
  Administered 2012-10-14: 18:00:00 2.5 mg via ORAL
  Filled 2012-10-14: qty 1

## 2012-10-14 MED ORDER — MIDODRINE HCL 5 MG PO TABS
ORAL_TABLET | ORAL | Status: AC
  Filled 2012-10-14: qty 2

## 2012-10-14 MED ORDER — DOXERCALCIFEROL 4 MCG/2ML IV SOLN
INTRAVENOUS | Status: AC
  Filled 2012-10-14: qty 4

## 2012-10-14 MED ORDER — LIDOCAINE HCL (PF) 1 % IJ SOLN: 5.0000 mL | INTRAMUSCULAR | Status: DC | PRN

## 2012-10-14 MED ORDER — HEPARIN SODIUM (PORCINE) 1000 UNIT/ML DIALYSIS: 1000.0000 [IU] | INTRAMUSCULAR | Status: DC | PRN

## 2012-10-14 MED ORDER — ALTEPLASE 2 MG IJ SOLR
2.0000 mg | Freq: Once | INTRAMUSCULAR | Status: DC | PRN
  Filled 2012-10-14: qty 2

## 2012-10-14 MED ORDER — ACETAMINOPHEN 325 MG PO TABS
ORAL_TABLET | ORAL | Status: AC
  Filled 2012-10-14: qty 2

## 2012-10-14 MED ORDER — HEPARIN SODIUM (PORCINE) 1000 UNIT/ML IJ SOLN
3400.0000 [IU] | Freq: Once | INTRAMUSCULAR | Status: AC
  Administered 2012-10-14: 3400 [IU] via INTRAVENOUS

## 2012-10-14 MED ORDER — HEPARIN SODIUM (PORCINE) 1000 UNIT/ML DIALYSIS: 3200.0000 [IU] | Freq: Once | INTRAMUSCULAR | Status: DC

## 2012-10-14 MED ORDER — PENTAFLUOROPROP-TETRAFLUOROETH EX AERO: 1.0000 "application " | INHALATION_SPRAY | CUTANEOUS | Status: DC | PRN

## 2012-10-14 NOTE — Progress Notes (Signed)
[facility] PROGRESS NOTE  Alan [name] ZOX:[id number] DOB: November 24, 1931 DOA: 10/09/2012 PCP: Junious Silk, MD  Assessment/Plan: Principal Problem:   Syncope and collapse: Likely secondary to hypotension following hemodialysis. Cardiology consulted. He is noted to have grade 2 diastolic dysfunction along with severe aortic stenosis nonoperable candidate. Nephrology and cardiology agreed that he should be off midodrine, however right now is not able to tolerate this. His aortic stenosis only continued to get worse leading to more episodes of syncope. Given that he is a nonoperable candidate for repair or for palliative valvuloplasty, palliative care is been consulted for goals of meeting with family. Appreciate their efforts.  Active Problems:   Aortic stenosis: Nonoperative candidate: See above.   CAD (coronary artery disease)   Diabetes mellitus: Blood sugars stable: Continue monitoring.     Hypotension: Stable for now.   Atrial fibrillation: Patient currently in a flutter, rate controlled. On anticoagulation. I INR now therapeutic    Orthostatic hypotension: See above  Elevated troponin levels: No evidence of acute ischemia. May be related to hypotension and left ventricular strain in the setting of aortic stenosis. Continue aspirin and statin  Constipation: Responded to fleets enema  Acute mild delirium:? Sundowning although it seems to persist during the day. He's not severely agitated. Vital signs are stable. Nonetheless, we'll check lab work looking for signs of infection. We'll also check ABG looking for possible hypoxia. Does have history of atrial fibrillation but has no focal neurological deficits so we'll hold off on CT scan for now. Will also check A1c to rule out episodes of hypoglycemia. 8 months ago, it was borderline low. He is on glipizide. Much improved over the last 2 days sugars have been stable. A1c was only 6.5.[name]     Code Status: Full code  Family  Communication: Discussed plan with patient and son at the bedside  Disposition Plan: Home in a few days, once long-term plan is discussed and decided   Consultants:  Ross-cardiology  Coladonato-nephrology  Procedures:  None  Antibiotics:  None  HPI/Subjective: No Major complaints. Tired.  Objective: Filed Vitals:   10/14/12 1457  BP: 111/52  Pulse: 40  Temp: 98.6 F (37 C)  Resp: 18    Intake/Output Summary (Last 24 hours) at 10/14/12 1810 Last data filed at 10/14/12 1249  Gross per 24 hour  Intake    723 ml  Output      0 ml  Net    723 ml   Filed Weights   10/14/12 0700 10/14/12 0831 10/14/12 1249  Weight: 96.2 kg (212 lb 1.3 oz) 96 kg (211 lb 10.3 oz) 96 kg (211 lb 10.3 oz)    Exam:   General:  Alert and oriented x2, no acute distress  Cardiovascular: Regular rate and rhythm, S1-S2, 2/6 systolic ejection murmur  Respiratory: Clear auscultation bilaterally  Abdomen: Soft, nontender, nondistended, hypoactive bowel sounds  Musculoskeletal: Status post bilateral BKA   Data Reviewed: Basic Metabolic Panel:  Recent Labs Lab 10/09/12 1845 10/11/12 0915 10/12/12 1600 10/14/12 1442  NA 145 134* 136 137  K 3.5 4.4 4.9 4.6  CL 99 92* 91* 99  CO2 29 23 26 26   GLUCOSE 213* 194* 121* 76  BUN 25* 53* 37* 24*  CREATININE 6.00* 9.62* 7.61* 5.43*  CALCIUM 9.9 9.2 9.3 8.5  PHOS  --  5.2*  --  3.1   CBC:  Recent Labs Lab 10/09/12 1845 10/11/12 0915 10/12/12 1600  WBC 7.8 9.4 11.3*  NEUTROABS 5.4  --  8.6*  HGB 15.9 13.5 14.2  HCT 46.2 39.8 41.7  MCV 94.1 94.1 93.9  PLT 168 166 158   Cardiac Enzymes:  Recent Labs Lab 10/09/12 1845 10/10/12 0055 10/10/12 0710 10/10/12 1240  TROPONINI <0.30 0.42* 0.53* 0.46*   CBG:  Recent Labs Lab 10/13/12 1128 10/13/12 1618 10/13/12 2103 10/14/12 0547 10/14/12 1606  GLUCAP 180* 138* 177* 122* 81    Recent Results (from the past 240 hour(s))  MRSA PCR SCREENING     Status: None    Collection Time    10/10/12  8:54 AM      Result Value Range Status   MRSA by PCR NEGATIVE  NEGATIVE Final   Comment:            The GeneXpert MRSA Assay (FDA     approved for NASAL specimens     only), is one component of a     comprehensive MRSA colonization     surveillance program. It is not     intended to diagnose MRSA     infection nor to guide or     monitor treatment for     MRSA infections.     Studies: No results found.  Scheduled Meds: . allopurinol  150 mg Oral Daily  . aspirin EC  81 mg Oral Daily  . calcium carbonate  1 tablet Oral TID WC  . doxercalciferol  6 mcg Intravenous Q T,Th,Sa-HD  . glimepiride  2 mg Oral QAC breakfast  . hydrocortisone   Rectal BID  . insulin aspart  0-5 Units Subcutaneous QHS  . insulin aspart  0-9 Units Subcutaneous TID WC  . metoprolol tartrate  25 mg Oral BID  . midodrine  10 mg Oral Q T,Th,Sa-HD  . multivitamin  1 tablet Oral QHS  . simvastatin  40 mg Oral QHS  . sodium chloride  3 mL Intravenous Q12H  . sodium chloride  3 mL Intravenous Q12H  . Warfarin - Pharmacist Dosing Inpatient   Does not apply q1800   Continuous Infusions:   Principal Problem:   Syncope and collapse Active Problems:   Aortic stenosis   CAD (coronary artery disease)   End stage renal disease on dialysis   Diabetes mellitus   Chronic anticoagulation   Hypotension   Atrial fibrillation   Orthostatic hypotension   Unspecified constipation   Altered mental status    Time spent: 15 minutes    [name]  [facility] Pager [phone]. If 7PM-7AM, please contact night-coverage at [email or web address], password [facility] 10/14/2012, 6:10 PM  LOS: 5 days

## 2012-10-14 NOTE — Progress Notes (Signed)
ANTICOAGULATION CONSULT NOTE - Follow Up Consult  Pharmacy Consult for warfarin Indication: atrial fibrillation  Allergies  Allergen Reactions  . Ancef [Cefazolin] Rash    Patient cant recall if allergy to this med exists.     Patient Measurements: Height: 6\' 1"  (185.4 cm) (with bilateral prosthesis) Weight: 211 lb 10.3 oz (96 kg) IBW/kg (Calculated) : 79.9  Vital Signs: Temp: 98 F (36.7 C) (10/07 1249) Temp src: Oral (10/07 1249) BP: 122/66 mmHg (10/07 1249) Pulse Rate: 72 (10/07 1249)  Labs:  Recent Labs  10/12/12 0514 10/12/12 1600 10/13/12 1005 10/14/12 0545  HGB  --  14.2  --   --   HCT  --  41.7  --   --   PLT  --  158  --   --   LABPROT 21.8*  --  28.1* 28.7*  INR 1.97*  --  2.74* 2.82*  CREATININE  --  7.61*  --   --     Estimated Creatinine Clearance: 9.8 ml/min (by C-G formula based on Cr of 7.61).  Assessment: 79 yo male here with syncopal episode during HD. On warfarin PTA for Afib- home dose is 5mg  daily except 2.5mg  on Wednesdays. Yesterday INR increased drastically to 2.74 from 1.9. Today INR increased slightly to 2.82. No bleeding noted. CBC is stable.  Goal of Therapy:  INR 2-3 Monitor platelets by anticoagulation protocol: Yes   Plan:  1. Coumadin 2.5mg  po today  2. Daily PT/INR  Keandre Linden D. Tavaria Mackins, PharmD Clinical Pharmacist Pager: [phone] 10/14/2012 2:06 PM

## 2012-10-14 NOTE — Progress Notes (Signed)
Patient ID: Alan [name], male   DOB: 15-Jul-1931, 79 y.o.   MRN: [id number]  [facility] Progress Note    Subjective:   No new complaints   Objective:   BP 101/55  Pulse 80  Temp(Src) 97.9 F (36.6 C) (Oral)  Resp 18  Ht 6\' 1"  (1.854 m)  Wt 96 kg (211 lb 10.3 oz)  BMI 27.93 kg/m2  SpO2 95%  Intake/Output: I/O last 3 completed shifts: In: 963 [P.O.:960; I.V.:3] Out: 0    Intake/Output this shift:  Total I/O In: 240 [P.O.:240] Out: -  Weight change:   Physical Exam: Gen:WD elderly AAM in NAD CVS:RRR with III/VI SEM Resp:CTA Abd:+BS, soft, NT Ext:s/p bilateral BKA's, RUE AVF +T/B  Labs: BMET  Recent Labs Lab 10/09/12 1845 10/11/12 0915 10/12/12 1600  NA 145 134* 136  K 3.5 4.4 4.9  CL 99 92* 91*  CO2 29 23 26   GLUCOSE 213* 194* 121*  BUN 25* 53* 37*  CREATININE 6.00* 9.62* 7.61*  ALBUMIN  --  3.5 3.6  CALCIUM 9.9 9.2 9.3  PHOS  --  5.2*  --    CBC  Recent Labs Lab 10/09/12 1845 10/11/12 0915 10/12/12 1600  WBC 7.8 9.4 11.3*  NEUTROABS 5.4  --  8.6*  HGB 15.9 13.5 14.2  HCT 46.2 39.8 41.7  MCV 94.1 94.1 93.9  PLT 168 166 158    @IMGRELPRIORS @ Medications:    . allopurinol  150 mg Oral Daily  . aspirin EC  81 mg Oral Daily  . calcium carbonate  1 tablet Oral TID WC  . doxercalciferol  6 mcg Intravenous Q T,Th,Sa-HD  . glimepiride  2 mg Oral QAC breakfast  . hydrocortisone   Rectal BID  . insulin aspart  0-5 Units Subcutaneous QHS  . insulin aspart  0-9 Units Subcutaneous TID WC  . metoprolol tartrate  25 mg Oral BID  . midodrine  10 mg Oral Q T,Th,Sa-HD  . multivitamin  1 tablet Oral QHS  . simvastatin  40 mg Oral QHS  . sodium chloride  3 mL Intravenous Q12H  . sodium chloride  3 mL Intravenous Q12H  . Warfarin - Pharmacist Dosing Inpatient   Does not apply q1800  Dialysis Orders: TTS @ [facility]  96.5 Kg 2K/2.25 Ca 4hr 15 min 3400 u Heparin RUA AVF 500/1.5 AF  Hectorol 6 mcg IV/HD No epo/ no  venofer    Assessment/ Plan:    1. Syncope / severe AS / chronic hypotension- using midodrine only on HD days.  Not a candidate for valve surgery or for TAVRS, cont medical Rx. Appreciate cardiology assistance 2. Afib/Aflutter- on metoprolol and coumadin, rate controlled 3. ESRD, cont TTS HD, below dry wt, keep even with HD today 4. Anemia - hgb 15.9, no ESA outpt. Tsat 28 (9/25) 5. Metabolic bone disease - Ca+ 9.9 Phos 4.9. Pth 124. hectorol . Tums AC 6. Nutrition - Alb 4.0 (9/25) renal diet. Multi vit 7. DM- per primary. On glimepiride and aspart. CBG 158 8. Dispo- stable for d/c from renal standpoint  Vinson Moselle  MD Pager [phone]    Cell  [phone] 10/14/2012, 9:30 AM

## 2012-10-14 NOTE — Procedures (Signed)
I was present at this dialysis session. I have reviewed the session itself and made appropriate changes.   Rob Alanii Ramer  MD Pager [phone]    Cell  [phone] 10/14/2012, 9:08 AM    

## 2012-10-14 NOTE — Progress Notes (Signed)
Thank you for consulting the Palliative Medicine Team at [facility] to meet your patient's and family's needs.   The reason that you asked [place] to see your patient is for code status discussion and goals of care  We have scheduled your patient for a meeting: tomorrow Wednesday 10/8 @ 9:00 am  The Surrogate decision maker is: wife Sherryll Burger  and son Nicholous [phone]  Other family members that need to be present: none  Your patient is able to participate  Additional Narrative: spoke with patient in HD he was sleepy but able to tell writer to call his son and gave correct cell # [phone]; patient indicated he knows he is in a hard situation and though it will be hard wants to talk with his family about this he is agreeable to meeting with PMT provider when family can be present; spoke with Dr Arta Silence who indicated that at this point in time not ready top stop HD however would like to initiate goal discussion and primarily address code status  -Spoke with son Wake and meeting time scheduled as noted above   Valente David, RN 10/14/2012, 1:06 PM Palliative Medicine Team RN Liaison [phone]

## 2012-10-14 NOTE — Progress Notes (Signed)
PT Cancellation Note  Patient Details Name: TALIS IWAN MRN: [id number] DOB: 09/14/1931   Cancelled Treatment:    Reason Eval/Treat Not Completed: Medical issues which prohibited therapy.  In HD all am and part of pm.  Unable to get back to pt in pm.  Will return tomorrow.  Thanks.    INGOLD,Silverio Hagan 10/14/2012, 2:55 PM [facility] Acute Rehabilitation [phone] (pager)

## 2012-10-14 NOTE — Progress Notes (Signed)
TELEMETRY: Reviewed telemetry pt in atrial flutter with a controlled ventricular response.: Filed Vitals:   10/14/12 1130 10/14/12 1200 10/14/12 1230 10/14/12 1249  BP: 108/50 111/56 110/54 122/66  Pulse: 53 80 80 72  Temp:    98 F (36.7 C)  TempSrc:    Oral  Resp:    18  Height:      Weight:    211 lb 10.3 oz (96 kg)  SpO2:        Intake/Output Summary (Last 24 hours) at 10/14/12 1333 Last data filed at 10/14/12 1249  Gross per 24 hour  Intake    723 ml  Output      0 ml  Net    723 ml    SUBJECTIVE Patient seen in dialysis. Feels ok. Is thinking about palliative care issues.  LABS: Basic Metabolic Panel:  Recent Labs  16/10/96 1600  NA 136  K 4.9  CL 91*  CO2 26  GLUCOSE 121*  BUN 37*  CREATININE 7.61*  CALCIUM 9.3   Liver Function Tests:  Recent Labs  10/12/12 1600  AST 31  ALT 19  ALKPHOS 72  BILITOT 0.7  PROT 7.7  ALBUMIN 3.6   No results found for this basename: LIPASE, AMYLASE,  in the last 72 hours CBC:  Recent Labs  10/12/12 1600  WBC 11.3*  NEUTROABS 8.6*  HGB 14.2  HCT 41.7  MCV 93.9  PLT 158   Cardiac Enzymes: No results found for this basename: CKTOTAL, CKMB, CKMBINDEX, TROPONINI,  in the last 72 hours Hemoglobin A1C:  Recent Labs  10/12/12 1600  HGBA1C 6.5*   Radiology/Studies:  Dg Chest Port 1 View  10/09/2012   *RADIOLOGY REPORT*  Clinical Data: Syncope after dialysis  PORTABLE CHEST - 1 VIEW  Comparison: 01/23/2012  Findings: Heart size within normal limits.  Vascular pattern normal.  Lungs clear except for mild lower lobe atelectasis on the left.  The patient noted to be status post CABG.  IMPRESSION: No acute abnormalities   Original Report Authenticated By: Esperanza Heir, M.D.   Echo:Study Conclusions  - Left ventricle: The cavity size was normal. Wall thickness was increased in a pattern of moderate LVH. Systolic function was normal. The estimated ejection fraction was in the range of 60% to 65%. Wall  motion was normal; there were no regional wall motion abnormalities. Features are consistent with a pseudonormal left ventricular filling pattern, with concomitant abnormal relaxation and increased filling pressure (grade 2 diastolic dysfunction). - Aortic valve: AV is thickened, calcified with restricted motion. Peak and mean gradients across the valve are 59 and 39 consistent with severe AS SInce echo from earlier this year mean gradient is increased. Valve area: 0.78cm^2(VTI). Valve area: 0.77cm^2 (Vmax). - Mitral valve: Calcified annulus. Mildly thickened, mildly calcified leaflets .  Ecg: Atrial flutter with rate 80 bpm. Old inferior and septal infarcts. Low voltage.  PHYSICAL EXAM General: Well developed, elderly, in no acute distress. Head: Normal Neck: Negative for carotid bruits. JVD not elevated. Lungs: Clear bilaterally to auscultation without wheezes, rales, or rhonchi. Breathing is unlabored. Heart: RRR S1 S2 with grade 3/6 systolic murmur RUSB. Abdomen: Soft, non-tender, non-distended with normoactive bowel sounds. Extremities: Bilateral BKAs Neuro: Alert and oriented X 3. Moves all extremities spontaneously.   ASSESSMENT AND PLAN: 1. Severe aortic stenosis. 2. CAD s/p CABG 1996. Recent troponin elevation probably due to demand ischemia. No clinical symptoms of angina. 3. ESRD on hemodialysis 4. Syncope/Hypotension secondary to preload reduction with  dialysis in setting of severe AS. 5. DM on insulin 6. Diabetic neuropathy. 7. S/p bilateral BKAs. 8. PAD 9. Poor functional capacity. 10. Chronic atrial flutter with controlled ventricular response. 11. Chronic anticoagulation.   Plan: As noted yesterday he is not a candidate for AVR or TAVR. Palliative care note appreciative. Patient is thinking about these issues and wonders whether he will go to dialysis Thursday. Wants family involved in discussion. Is very appreciative of everyone's care. I will sign off now.  Please let me know if I can be of further assistance.   Principal Problem:   Syncope and collapse Active Problems:   Aortic stenosis   CAD (coronary artery disease)   End stage renal disease on dialysis   Diabetes mellitus   Chronic anticoagulation   Hypotension   Atrial fibrillation   Orthostatic hypotension   Unspecified constipation   Altered mental status    Signed, Peter [place] MD,FACC 10/14/2012 1:33 PM

## 2012-10-15 DIAGNOSIS — Z515 Encounter for palliative care: Secondary | ICD-10-CM

## 2012-10-15 DIAGNOSIS — I959 Hypotension, unspecified: Secondary | ICD-10-CM

## 2012-10-15 LAB — GLUCOSE, CAPILLARY
Glucose-Capillary: 110 mg/dL — ABNORMAL HIGH (ref 70–99)
Glucose-Capillary: 195 mg/dL — ABNORMAL HIGH (ref 70–99)
Glucose-Capillary: 166 mg/dL — ABNORMAL HIGH (ref 70–99)

## 2012-10-15 LAB — PROTIME-INR: INR: 2.39 — ABNORMAL HIGH (ref 0.00–1.49)

## 2012-10-15 MED ORDER — METOPROLOL TARTRATE 25 MG PO TABS: 25.0000 mg | ORAL_TABLET | Freq: Two times a day (BID) | ORAL | Status: DC

## 2012-10-15 MED ORDER — WARFARIN SODIUM 5 MG PO TABS
5.0000 mg | ORAL_TABLET | Freq: Once | ORAL | Status: DC
  Filled 2012-10-15: qty 1

## 2012-10-15 MED ORDER — MIDODRINE HCL 10 MG PO TABS: 10.0000 mg | ORAL_TABLET | ORAL | Status: AC

## 2012-10-15 NOTE — Consult Note (Signed)
Patient ZO:XWRUEAV D See      DOB: 04-29-1931      WUJ:[id number]     Consult Note from the Palliative Medicine Team at [facility]    Consult Requested by: Dr. Rito Ehrlich    PCP: Junious Silk, MD Reason for Consultation: GOC    Phone Number:[phone] Related symptom recommendaitons Assessment of patients Current state: 79 yr old african [place] male with known severe AS, ESRD on dialysis, syncope related to both these conditions.  Patient with multiple chronic illnesses including diabetes, and PVD.  The patient has been told that their are no further interventions for the patient heart valve and that he is likely going to continue to have syncope and issues with his blood pressure.  He understands that he has the right to discontinue dialysis at anytime that he feels that he is not supporting his quality of life.  He also understands that he may reach a point where his body will not be able to tolerate having dialysis. At that time,the doctors may tell him he has to stop. He underrdstands.  At this time, maximal support from family   Goals of Care: 1.  Code Status: Full   2. Scope of Treatment: Patient would like to continue full curative scope of treatment at this time understand that he may make limitations on treatments including his code status and dialysis at any time.  4. Disposition: Home with Home health support   3. Symptom Management:  1. Syncope: currently being maintained on midodrine     4. 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  He is well read on the topic of dialysis  5. Spiritual: Patient does have a faith that he goes to for support.   Patient Documents Completed or Given: Document Given Completed  Advanced Directives Pkt    MOST    DNR    Gone from My Sight    Hard Choices 2 copies     Brief HPI: 79 yr old african [place] male with severe AS and end  stage renal disease.  Patient has been having recurrent syncope related to both diseases.  We were asked to assist with GOC and related symptom recommendations.  This is a tough case.   ROS: No complaints of pain, nausea, vomiting. Syncope as above.    PMH:  Past Medical History  Diagnosis Date  . Diabetes mellitus   . Gout   . Hypertension   . Hyperlipidemia   . Arthritis   . Prostate cancer     a. s/p seed implant  . Cataract     a. s/p bilat cataract surgeries.  . Diabetic neuropathy   . Atrial fibrillation     a. chronic/rate controlled;  b. on coumadin  . Moderate aortic stenosis     a. 05/2012 Echo: EF 55-60%, mod AS, mildly dil LA - prev not felt to be a surgical candidate  . Anemia of chronic disease   . Coronary artery disease     a. remote CABG x 5 in 1996; b. 04/2003 Cath: LAD 100p, LCX min irregs, RCA 18m, VG->D1 nl, VG->AM->PDA->RPL nl, LIMA->LAD nl, EF 60%.  . Edema   . PAD (peripheral artery disease)     a. s/p bilat BKA's (L - 07/2011, R - 02/2012).  . Diabetic retinopathy   . Chronic anticoagulation     a. on coumadin  . End stage renal  disease     a. Started dialysis October 2012  . Nephrolithiasis     a. prior history of percutaneous nephrostomy  . Orthostatic hypotension     a. on midodrine     PSH: Past Surgical History  Procedure Laterality Date  . Ventral hernia repair    . Inguinal hernia repair      right side  . Colonoscopy    . Kidney stone surgery    . Polypectomy    . Tonsillectomy    . Toe surgery      removal little toe right foot  . Cardiac catheterization  04/19/2003    SEVERE 2 VESSEL OBSTRUCTIVE ATHERSCLEROTIC CAD  . Coronary artery bypass graft  1996    LIMA GRAFT TO THE LAD, SEQUENTIAL SAPHENOUS  VEIN GRAFT TO THE FIRST AND SECOND DIAGONIAL BRANCHES, SEQUENTIAL SAPHENOUS VEIN GRAFT TO THE ACUTE MARGINAL, POSTERIOR DESCENDING, AND POSTERIOR LATERAL BRANCHES OF THE RIGHT CORONARY ARTERY  . Radioactive seed implant    . US  echocardiography  09/06/2009    EF 55-60%  . Cardiovascular stress test  04/13/2003    EF 54%. EVIDENCE OF ANTERO-APICAL ISCHEMIA. NORMAL LV SIZE AND FUNCTION  . Tee without cardioversion  05/09/2011    Procedure: TRANSESOPHAGEAL ECHOCARDIOGRAM (TEE);  Surgeon: Laurey Morale, MD;  Location: [facility] ENDOSCOPY;  Service: Cardiovascular;  Laterality: N/A;  . Hernia repair    . Amputation  07/20/2011    Procedure: AMPUTATION BELOW KNEE;  Surgeon: Nada Libman, MD;  Location: [facility] OR;  Service: Vascular;  Laterality: Left;  . Amputation  01/25/2012    Procedure: AMPUTATION DIGIT;  Surgeon: Chuck Hint, MD;  Location: [facility] OR;  Service: Vascular;  Laterality: Right;  second  . Av fistula placement      right  . Transmetatarsal amputation Right 02/15/2012    Procedure: TRANSMETATARSAL AMPUTATION;  Surgeon: Nada Libman, MD;  Location: [facility] OR;  Service: Vascular;  Laterality: Right;  . Amputation Right 02/29/2012    Procedure: AMPUTATION BELOW KNEE;  Surgeon: Nada Libman, MD;  Location: [facility] OR;  Service: Vascular;  Laterality: Right;  Right BKA   I have reviewed the FH and SH and  If appropriate update it with new information. Allergies  Allergen Reactions  . Ancef [Cefazolin] Rash    Patient cant recall if allergy to this med exists.    Scheduled Meds: . allopurinol  150 mg Oral Daily  . aspirin EC  81 mg Oral Daily  . calcium carbonate  1 tablet Oral TID WC  . doxercalciferol  6 mcg Intravenous Q T,Th,Sa-HD  . glimepiride  2 mg Oral QAC breakfast  . hydrocortisone   Rectal BID  . insulin aspart  0-5 Units Subcutaneous QHS  . insulin aspart  0-9 Units Subcutaneous TID WC  . metoprolol tartrate  25 mg Oral BID  . midodrine  10 mg Oral Q T,Th,Sa-HD  . multivitamin  1 tablet Oral QHS  . simvastatin  40 mg Oral QHS  . sodium chloride  3 mL Intravenous Q12H  . sodium chloride  3 mL Intravenous Q12H  . warfarin  5 mg Oral ONCE-1800  . Warfarin - Pharmacist Dosing Inpatient   Does not  apply q1800   Continuous Infusions:  PRN Meds:.sodium chloride, acetaminophen, bisacodyl, sodium chloride    BP 106/39  Pulse 86  Temp(Src) 98.2 F (36.8 C) (Oral)  Resp 20  Ht 6\' 1"  (1.854 m)  Wt 96.5 kg (212 lb 11.9 oz)  BMI  28.07 kg/m2  SpO2 98%   PPS: 40%   Intake/Output Summary (Last 24 hours) at 10/15/12 1207 Last data filed at 10/15/12 4098  Gross per 24 hour  Intake    723 ml  Output      0 ml  Net    723 ml    Physical Exam:  General: oriented to time place and person with what appears to be the capacity for decision making.  Patient understands the consequences of stopping dialysis and having severe heart disease.   HEENT:  PERRL,EOMI anicteric, mmm Chest:  Decreased very distant breath sounds, no rhonchi , rales or wheezes CVS: distant, S1, S2 in current position can not appreciate a murmur Abdomen: obese not tender not distended positive bowel sounds Ext: bilateral below the knee amputations at baseline Neuro: oriented to time place and person very knowledgeable about his dialysis process but somewhat in denial about the need to make advanced decisions regarding advanced directives and treatment plans.    Labs: CBC    Component Value Date/Time   WBC 11.3* 10/12/2012 1600   RBC 4.44 10/12/2012 1600   HGB 14.2 10/12/2012 1600   HCT 41.7 10/12/2012 1600   PLT 158 10/12/2012 1600   MCV 93.9 10/12/2012 1600   MCH 32.0 10/12/2012 1600   MCHC 34.1 10/12/2012 1600   RDW 13.9 10/12/2012 1600   LYMPHSABS 1.4 10/12/2012 1600   MONOABS 1.3* 10/12/2012 1600   EOSABS 0.1 10/12/2012 1600   BASOSABS 0.0 10/12/2012 1600    CMP     Component Value Date/Time   NA 137 10/14/2012 1442   K 4.6 10/14/2012 1442   CL 99 10/14/2012 1442   CO2 26 10/14/2012 1442   GLUCOSE 76 10/14/2012 1442   BUN 24* 10/14/2012 1442   CREATININE 5.43* 10/14/2012 1442   CALCIUM 8.5 10/14/2012 1442   CALCIUM 8.8 10/27/2010 0734   PROT 7.7 10/12/2012 1600   ALBUMIN 2.9* 10/14/2012 1442   AST 31  10/12/2012 1600   ALT 19 10/12/2012 1600   ALKPHOS 72 10/12/2012 1600   BILITOT 0.7 10/12/2012 1600   GFRNONAA 9* 10/14/2012 1442   GFRAA 10* 10/14/2012 1442    Chest Xray Reviewed/Impressions: No acute abnormalities     Time In Time Out Total Time Spent with Patient Total Overall Time  915 am 1015 am 60 min 60 min    Greater than 50%  of this time was spent counseling and coordinating care related to the above assessment and plan.  Discussed together in part with Dr. Barnie Mort L. Ladona Ridgel, MD MBA The Palliative Medicine Team at [facility] Phone: [phone] Pager: [phone]

## 2012-10-15 NOTE — Progress Notes (Signed)
10/14/12 Pt.is A/Ox2 and is forgetful at times. He is able to communicate his needs to staff. He has had no c/o pain and no signs of distress during the shift.

## 2012-10-15 NOTE — Progress Notes (Signed)
Discharge instructions discussed with patient and family. Patient going home with home health PT and RN. Discussed f/u with wife who agrees to schedule appt for in weeks. Medication discussed with family. Patient and family verbally understand. Demonstrates understanding with teachback.

## 2012-10-15 NOTE — Progress Notes (Addendum)
Monitor d/c'd due to patient being discharged. CCMD notified of patients discharge.

## 2012-10-15 NOTE — Progress Notes (Signed)
Physical Therapy Treatment Patient Details Name: Alan [name] MRN: [id number] DOB: 10-11-1931 Today's Date: 10/15/2012 Time: 4696-2952 PT Time Calculation (min): 39 min  PT Assessment / Plan / Recommendation  History of Present Illness Pt is a 79 y/o male admitted for syncope following dialysis.  Pt. with severe aortic stenosis.  Hx of bil. BKA.   PT Comments   Spoke extensively with patient regarding progressing ambulation. Pt perseverant on progressing ambulation when patient hasn't ambulated since his bilat LE amputations. Explained to wife and pt the benefit of SNF to receive increased daily therapy to progress ambulation however strongly denied due to pt strong desire to return home. Recommend HHPT to progress ambulation.   Follow Up Recommendations  Home health PT;Supervision/Assistance - 24 hour     Does the patient have the potential to tolerate intense rehabilitation     Barriers to Discharge        Equipment Recommendations  None recommended by PT    Recommendations for Other Services    Frequency Min 3X/week   Progress towards PT Goals Progress towards PT goals: Progressing toward goals  Plan Current plan remains appropriate    Precautions / Restrictions Precautions Precautions: Fall Precaution Comments: bilat BKA, prosthetics Restrictions Weight Bearing Restrictions: No   Pertinent Vitals/Pain Denies pain   Mobility  Bed Mobility Bed Mobility: Not assessed Transfers Transfers: Sit to Stand;Stand to Sit Sit to Stand: 1: +2 Total assist;With upper extremity assist;From bed Sit to Stand: Patient Percentage: 40% Stand to Sit: 1: +2 Total assist;With upper extremity assist;To chair/3-in-1 Stand to Sit: Patient Percentage: 40% Details for Transfer Assistance: completed 2 stands. max directional v/c's to no pull up on walker.  max verbal and tactile cues to achieve bilat hip extension and elbow ext to achieve full upright  standing Ambulation/Gait Ambulation/Gait Assistance: Not tested (comment)    Exercises     PT Diagnosis:    PT Problem List:   PT Treatment Interventions:     PT Goals (current goals can now be found in the care plan section) Acute Rehab PT Goals Patient Stated Goal: go home  Visit Information  Last PT Received On: 10/15/12 Assistance Needed: +2 History of Present Illness: Pt is a 79 y/o male admitted for syncope following dialysis.  Pt. with severe aortic stenosis.  Hx of bil. BKA.    Subjective Data  Subjective: pt agitated regarding be d/c'd home and meeting doctores Patient Stated Goal: go home   Cognition  Cognition Arousal/Alertness: Awake/alert Behavior During Therapy: Agitated Overall Cognitive Status: Impaired/Different from baseline Area of Impairment: Following commands;Safety/judgement;Awareness;Problem solving Memory: Decreased short-term memory Following Commands: Follows one step commands inconsistently Safety/Judgement: Decreased awareness of safety;Decreased awareness of deficits Awareness: Intellectual Problem Solving: Slow processing;Difficulty sequencing;Requires verbal cues;Requires tactile cues General Comments: pt impulsive, unsafe    Balance  Static Sitting Balance Static Sitting - Balance Support: No upper extremity supported Static Sitting - Level of Assistance: 5: Stand by assistance Static Sitting - Comment/# of Minutes: 15 min (max assist to don bilat prosthesis)  End of Session PT - End of Session Equipment Utilized During Treatment: Gait belt Activity Tolerance: Treatment limited secondary to agitation (and MD coming in to discuss discharge) Patient left:  (sitting EOB with MD) Nurse Communication: Mobility status   GP     Marcene Brawn 10/15/2012, 4:50 PM  Lewis Shock, PT, DPT Pager #: [phone] Office #: [phone]

## 2012-10-15 NOTE — Progress Notes (Signed)
ANTICOAGULATION CONSULT NOTE - Follow Up Consult  Pharmacy Consult for warfarin Indication: atrial fibrillation  Allergies  Allergen Reactions  . Ancef [Cefazolin] Rash    Patient cant recall if allergy to this med exists.     Patient Measurements: Height: 6\' 1"  (185.4 cm) (with bilateral prosthesis) Weight: 212 lb 11.9 oz (96.5 kg) IBW/kg (Calculated) : 79.9  Vital Signs: Temp: 98.2 F (36.8 C) (10/08 0443) Temp src: Oral (10/08 0443) BP: 106/39 mmHg (10/08 0922) Pulse Rate: 86 (10/08 0922)  Labs:  Recent Labs  10/12/12 1600 10/13/12 1005 10/14/12 0545 10/14/12 1442 10/15/12 0725  HGB 14.2  --   --   --   --   HCT 41.7  --   --   --   --   PLT 158  --   --   --   --   LABPROT  --  28.1* 28.7*  --  25.3*  INR  --  2.74* 2.82*  --  2.39*  CREATININE 7.61*  --   --  5.43*  --     Estimated Creatinine Clearance: 13.7 ml/min (by C-G formula based on Cr of 5.43).  Assessment: 79 yo male here with syncopal episode during HD. On warfarin PTA for Afib- home dose is 5mg  daily except 2.5mg  on Wednesdays. . His INR had increased drastically, but now appears slightly more stable. No bleeding noted.  Goal of Therapy:  INR 2-3 Monitor platelets by anticoagulation protocol: Yes   Plan:  1. Coumadin 5mg  po today  2. Daily PT/INR 3. Follow for discharge planning- likely will be able to resume home dose of warfarin unless a drastic change occurs  Khyron Garno D. Corrinne Benegas, PharmD Clinical Pharmacist Pager: [phone] 10/15/2012 11:48 AM

## 2012-10-15 NOTE — Progress Notes (Signed)
Patient ID: Alan [name], male   DOB: [birth year], 79 y.o.   MRN: [id number]  [facility] Progress Note    Subjective:   No new complaints   Objective:   BP 106/39  Pulse 86  Temp(Src) 98.2 F (36.8 C) (Oral)  Resp 20  Ht 6\' 1"  (1.854 m)  Wt 96.5 kg (212 lb 11.9 oz)  BMI 28.07 kg/m2  SpO2 98%  Intake/Output: I/O last 3 completed shifts: In: 966 [P.O.:960; I.V.:6] Out: 0    Intake/Output this shift:  Total I/O In: 240 [P.O.:240] Out: -  Weight change: 2.3 kg (5 lb 1.1 oz)  Physical Exam: Gen:WD elderly AAM in NAD CVS:RRR with III/VI SEM Resp:CTA Abd:+BS, soft, NT Ext:s/p bilateral BKA's, RUE AVF +T/B  Labs: BMET  Recent Labs Lab 10/09/12 1845 10/11/12 0915 10/12/12 1600 10/14/12 1442  NA 145 134* 136 137  K 3.5 4.4 4.9 4.6  CL 99 92* 91* 99  CO2 29 23 26 26   GLUCOSE 213* 194* 121* 76  BUN 25* 53* 37* 24*  CREATININE 6.00* 9.62* 7.61* 5.43*  ALBUMIN  --  3.5 3.6 2.9*  CALCIUM 9.9 9.2 9.3 8.5  PHOS  --  5.2*  --  3.1   CBC  Recent Labs Lab 10/09/12 1845 10/11/12 0915 10/12/12 1600  WBC 7.8 9.4 11.3*  NEUTROABS 5.4  --  8.6*  HGB 15.9 13.5 14.2  HCT 46.2 39.8 41.7  MCV 94.1 94.1 93.9  PLT 168 166 158    @IMGRELPRIORS @ Medications:    . allopurinol  150 mg Oral Daily  . aspirin EC  81 mg Oral Daily  . calcium carbonate  1 tablet Oral TID WC  . doxercalciferol  6 mcg Intravenous Q T,Th,Sa-HD  . glimepiride  2 mg Oral QAC breakfast  . hydrocortisone   Rectal BID  . insulin aspart  0-5 Units Subcutaneous QHS  . insulin aspart  0-9 Units Subcutaneous TID WC  . metoprolol tartrate  25 mg Oral BID  . midodrine  10 mg Oral Q T,Th,Sa-HD  . multivitamin  1 tablet Oral QHS  . simvastatin  40 mg Oral QHS  . sodium chloride  3 mL Intravenous Q12H  . sodium chloride  3 mL Intravenous Q12H  . Warfarin - Pharmacist Dosing Inpatient   Does not apply q1800  Dialysis Orders: TTS @ [facility]  96.5 Kg 2K/2.25 Ca 4hr 15 min 3400 u  Heparin RUA AVF 500/1.5 AF  Hectorol 6 mcg IV/HD No epo/ no venofer    Assessment/ Plan:    1. Syncope / severe AS / chronic hypotension- using midodrine only on HD days.  Not a candidate for valve surgery or for TAVRS, cont medical Rx 2. Afib/Aflutter- on metoprolol and coumadin, rate controlled 3. ESRD, cont TTS HD. Was below now just at dry wt, UF to dry wt with HD if still here tomorrow 4. Anemia - hgb 15.9, no ESA outpt. Tsat 28 (9/25) 5. Metabolic bone disease - Ca+ 9.9 Phos 4.9. Pth 124. hectorol . Tums AC 6. Nutrition - Alb 4.0 (9/25) renal diet. Multi vit 7. DM- per primary. On glimepiride and aspart. CBG 158 8. Dispo- ok for d/c from renal standpoint  Vinson Moselle  MD Pager [phone]    Cell  [phone] 10/15/2012, 10:36 AM

## 2012-10-15 NOTE — Discharge Summary (Signed)
Physician Discharge Summary  Alan [name] EAV:[id number] DOB: 09/16/1931 DOA: 10/09/2012  PCP: Junious Silk, MD  Admit date: 10/09/2012 Discharge date: 10/15/2012  Time spent:  25 minutes  Recommendations for Outpatient Follow-up:  1. Patient will followup with his PCP in 2 weeks. 2. Next dialysis session scheduled for 10/9  Discharge Diagnoses:  Principal Problem:   Syncope and collapse Active Problems:   Aortic stenosis   CAD (coronary artery disease)   End stage renal disease on dialysis   Diabetes mellitus   Chronic anticoagulation   Hypotension   Atrial fibrillation   Orthostatic hypotension   Unspecified constipation   Altered mental status   Discharge Condition: Relatively stable. Condition is chronic and likely to recur.  Diet recommendation: Heart healthy, carb modified  Filed Weights   10/14/12 0831 10/14/12 1249 10/15/12 0443  Weight: 96 kg (211 lb 10.3 oz) 96 kg (211 lb 10.3 oz) 96.5 kg (212 lb 11.9 oz)    History of present illness:  79 year old African American male past medical history of atrial fibrillation on Coumadin, end-stage renal disease, moderate to severe aortic stenosis with non-surgical candidate and history of bilateral amputations who presented after a syncopal episode following dialysis. In the emergency room he was noted to have stable labs dated creatinine of 6.0 and EKG noting chronic atrial flutter. He was admitted to the hospital service for further workup.  Hospital Course:  Principal Problem:   Syncope and collapse: Likely secondary to hypotension following hemodialysis. Cardiology consulted. He is noted to have grade 2 diastolic dysfunction along with severe aortic stenosis nonoperable candidate. Nephrology and cardiology agreed that he should be off midodrine, however right now is not able to tolerate this. His aortic stenosis only continued to get worse leading to more episodes of syncope. Given that he is a nonoperable  candidate for repair or for palliative valvuloplasty, palliative care is been consulted for goals of meeting with family. After extensive discussion, patient wanted to continue full efforts including being a full code. To try to minimize recurrence, midodrine is being changed from daily to 3 times a week at dialysis. In addition, his metoprolol is being changed from 37.5 mg twice a day to 25 by mouth twice a day. Patient had been on 25 by mouth twice a day with heart rate ranging between 50s to 60s  Active Problems:   Aortic stenosis non-operative candidate. See above.   CAD (coronary artery disease): Stable.    End stage renal disease on dialysis: Continued on dialysis 3 times a day.   Diabetes mellitus: Blood sugars remained stable during his hospitalization. Continue glipizide. A1c checked and found to be at 6.5.   Chronic anticoagulation: Therapeutic. INR 2.36   Hypotension: See above.   Atrial fibrillation: On anticoagulation. INR therapeutic at time of discharge.   Orthostatic hypotension: See above   Unspecified constipation on admission, patient did not have a bowel movement for several days. Responded well to fleets enema.    Altered mental status: Had some mild sundowning. With reassurance and family, patient was better oriented.   Procedures:  None  Consultations: [facility]-cardiology [facility]-nephrology [facility]-palliative care  Discharge Exam: Filed Vitals:   10/15/12 1349  BP: 94/34  Pulse: 81  Temp: 98.2 F (36.8 C)  Resp: 20    General: Alert and oriented x3, although he does get confused at times Cardiovascular: Regular rate and rhythm, S1-S2, 3/6 holosystolic murmur Respiratory: Clear to auscultation bilaterally Abdomen: Soft, nontender, nondistended, positive bowel sounds Extremities: No clubbing or  cyanosis or edema  Discharge Instructions  Discharge Orders   Future Orders Complete By Expires   Diet - low sodium heart healthy  As directed    Increase  activity slowly  As directed        Medication List         allopurinol 300 MG tablet  Commonly known as:  ZYLOPRIM  Take 0.5 tablets (150 mg total) by mouth daily.     aspirin EC 81 MG tablet  Take 1 tablet (81 mg total) by mouth daily.     calcium carbonate 500 MG chewable tablet  Commonly known as:  TUMS - dosed in mg elemental calcium  Chew 1 tablet by mouth 3 (three) times daily with meals. Phosphorous binder     estazolam 2 MG tablet  Commonly known as:  PROSOM  Take 1 mg by mouth at bedtime as needed (sleep).     glimepiride 4 MG tablet  Commonly known as:  AMARYL  Take 2 mg by mouth daily before breakfast.     ibuprofen 200 MG tablet  Commonly known as:  ADVIL,MOTRIN  Take 200 mg by mouth every 6 (six) hours as needed. For pain     metoprolol tartrate 25 MG tablet  Commonly known as:  LOPRESSOR  Take 1 tablet (25 mg total) by mouth 2 (two) times daily.     midodrine 10 MG tablet  Commonly known as:  PROAMATINE  Take 1 tablet (10 mg total) by mouth Every Tuesday,Thursday,and Saturday with dialysis.     multivitamin Tabs tablet  Take 1 tablet by mouth daily.     simvastatin 40 MG tablet  Commonly known as:  ZOCOR  Take 1 tablet (40 mg total) by mouth at bedtime.     warfarin 5 MG tablet  Commonly known as:  COUMADIN  Take 2.5-5 mg by mouth daily. Take 5mg  SuMTTS and 2.5mg  on Wed       Allergies  Allergen Reactions  . Ancef [Cefazolin] Rash    Patient cant recall if allergy to this med exists.        Follow-up Information   Follow up with [facility]. (RN and Physical Therapy)    Contact information:   [address] [place] [address] [phone]       Follow up with ALTHEIMER,MICHAEL D, MD In 2 weeks.   Specialty:  Endocrinology   Contact information:   [facility] [place] [address] [phone]        The results of significant diagnostics from this hospitalization (including imaging,  microbiology, ancillary and laboratory) are listed below for reference.    Significant Diagnostic Studies: Dg Chest Port 1 View  10/09/2012   *RADIOLOGY REPORT*  Clinical Data: Syncope after dialysis  PORTABLE CHEST - 1 VIEW  Comparison: 01/23/2012  Findings: Heart size within normal limits.  Vascular pattern normal.  Lungs clear except for mild lower lobe atelectasis on the left.  The patient noted to be status post CABG.  IMPRESSION: No acute abnormalities   Original Report Authenticated By: Esperanza Heir, M.D.    Microbiology: Recent Results (from the past 240 hour(s))  MRSA PCR SCREENING     Status: None   Collection Time    10/10/12  8:54 AM      Result Value Range Status   MRSA by PCR NEGATIVE  NEGATIVE Final   Comment:            The GeneXpert MRSA Assay (FDA  approved for NASAL specimens     only), is one component of a     comprehensive MRSA colonization     surveillance program. It is not     intended to diagnose MRSA     infection nor to guide or     monitor treatment for     MRSA infections.     Labs: Basic Metabolic Panel:  Recent Labs Lab 10/09/12 1845 10/11/12 0915 10/12/12 1600 10/14/12 1442  NA 145 134* 136 137  K 3.5 4.4 4.9 4.6  CL 99 92* 91* 99  CO2 29 23 26 26   GLUCOSE 213* 194* 121* 76  BUN 25* 53* 37* 24*  CREATININE 6.00* 9.62* 7.61* 5.43*  CALCIUM 9.9 9.2 9.3 8.5  PHOS  --  5.2*  --  3.1   Liver Function Tests:  Recent Labs Lab 10/11/12 0915 10/12/12 1600 10/14/12 1442  AST  --  31  --   ALT  --  19  --   ALKPHOS  --  72  --   BILITOT  --  0.7  --   PROT  --  7.7  --   ALBUMIN 3.5 3.6 2.9*   No results found for this basename: LIPASE, AMYLASE,  in the last 168 hours No results found for this basename: AMMONIA,  in the last 168 hours CBC:  Recent Labs Lab 10/09/12 1845 10/11/12 0915 10/12/12 1600  WBC 7.8 9.4 11.3*  NEUTROABS 5.4  --  8.6*  HGB 15.9 13.5 14.2  HCT 46.2 39.8 41.7  MCV 94.1 94.1 93.9  PLT 168 166 158    Cardiac Enzymes:  Recent Labs Lab 10/09/12 1845 10/10/12 0055 10/10/12 0710 10/10/12 1240  TROPONINI <0.30 0.42* 0.53* 0.46*   BNP: BNP (last 3 results) No results found for this basename: PROBNP,  in the last 8760 hours CBG:  Recent Labs Lab 10/14/12 1606 10/14/12 2104 10/15/12 0549 10/15/12 1114 10/15/12 1701  GLUCAP 81 270* 110* 195* 166*       Signed:  Araly Kaas K  [facility] 10/15/2012, 5:41 PM

## 2012-10-15 NOTE — Progress Notes (Signed)
Physical therapy in room at this time working with patient.

## 2012-10-15 NOTE — Progress Notes (Signed)
Patient transported out of hospital via wheelchair for discharge.

## 2012-10-16 NOTE — Care Management Note (Signed)
    Page 1 of 1   10/16/2012     10:52:16 AM   CARE MANAGEMENT NOTE 10/16/2012  Patient:  Alan [name], Alan [name]   Account Number:  [id number]  Date Initiated:  10/16/2012  Documentation initiated by:  [facility]  Subjective/Objective Assessment:   79 y.o. 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   Status of service:  Completed, signed off Medicare Important Message given?   (If response is "NO", the following Medicare IM given date fields will be blank) Date Medicare IM given:   Date Additional Medicare IM given:    Discharge Disposition:    Per UR Regulation:    If discussed at Long Length of Stay Meetings, dates discussed:    Comments:  10/16/12 [address] [place], [place] [phone] LE- spoke with pt and wife at bedside regarding discharge planning.  Offered pt list of HH agencies in [place].  Pt chose [facility] to render services. Kizzie Furnish of [facility] notified.  No DME needs identified at this time.

## 2012-10-17 ENCOUNTER — Ambulatory Visit (INDEPENDENT_AMBULATORY_CARE_PROVIDER_SITE_OTHER): Payer: MEDICARE | Admitting: Pharmacist

## 2012-10-17 DIAGNOSIS — I4891 Unspecified atrial fibrillation: Secondary | ICD-10-CM

## 2012-10-17 DIAGNOSIS — Z7901 Long term (current) use of anticoagulants: Secondary | ICD-10-CM

## 2012-11-10 ENCOUNTER — Ambulatory Visit (INDEPENDENT_AMBULATORY_CARE_PROVIDER_SITE_OTHER): Payer: MEDICARE | Admitting: Cardiology

## 2012-11-10 DIAGNOSIS — Z7901 Long term (current) use of anticoagulants: Secondary | ICD-10-CM

## 2012-11-10 DIAGNOSIS — I4891 Unspecified atrial fibrillation: Secondary | ICD-10-CM

## 2012-11-22 IMAGING — CR DG CHEST 2V
2 series · 2 of 2 positions shown · non-contrast
Comparison: Plain film chest 07/31/2005.

CLINICAL DATA: Status post coronary bypass.  Hyperglycemia.

CHEST - 2 VIEW

[w chest lat]
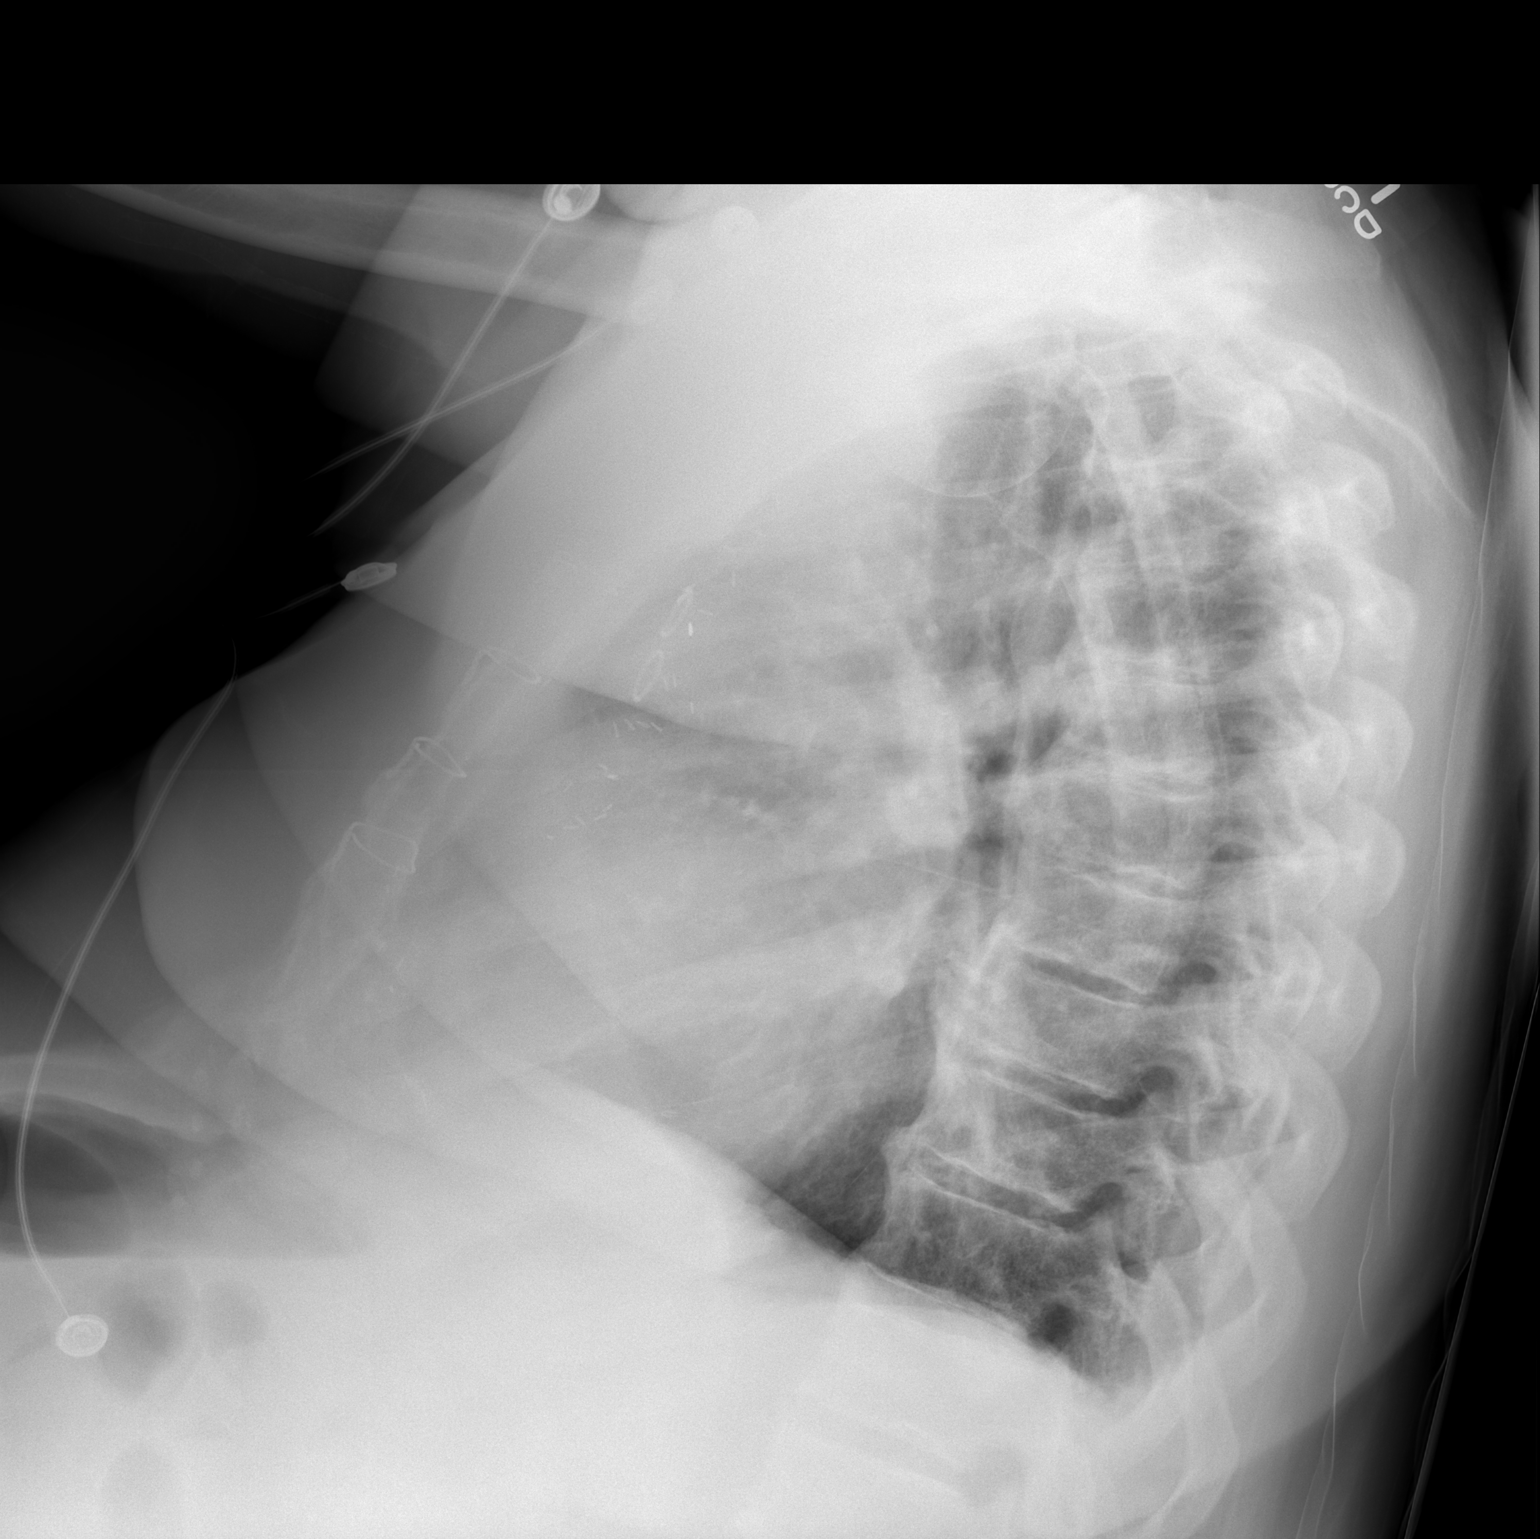

[view not recorded]
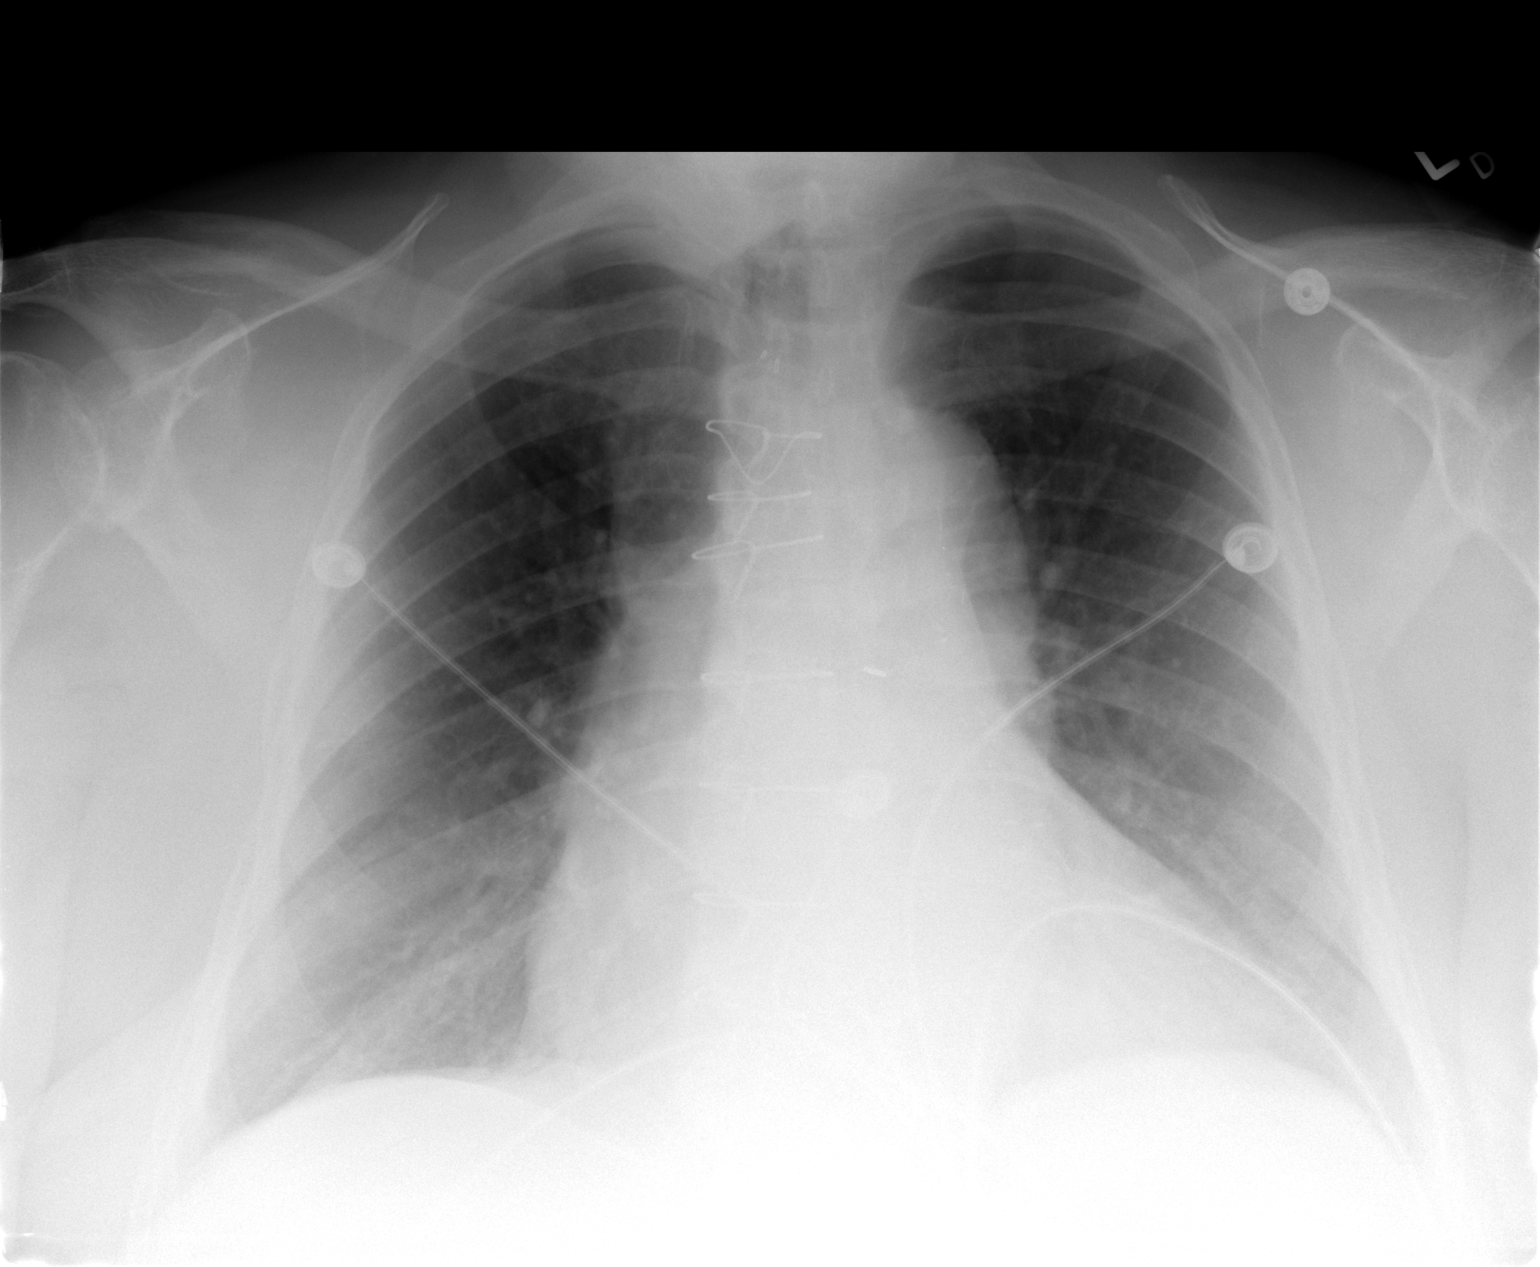

[2 of 2 positions shown; findings below may reference images not displayed]

FINDINGS: There is cardiomegaly but no pulmonary edema.  Lungs are
clear.  No effusion.  No focal bony abnormality.
IMPRESSION: Cardiomegaly without acute disease.

## 2012-12-01 ENCOUNTER — Ambulatory Visit (INDEPENDENT_AMBULATORY_CARE_PROVIDER_SITE_OTHER): Payer: MEDICARE | Admitting: Pharmacist

## 2012-12-01 DIAGNOSIS — Z7901 Long term (current) use of anticoagulants: Secondary | ICD-10-CM

## 2012-12-01 DIAGNOSIS — I4891 Unspecified atrial fibrillation: Secondary | ICD-10-CM

## 2012-12-02 LAB — PROTIME-INR: INR: 3.6 — AB (ref 0.9–1.1)

## 2012-12-03 ENCOUNTER — Ambulatory Visit (INDEPENDENT_AMBULATORY_CARE_PROVIDER_SITE_OTHER): Payer: MEDICARE | Admitting: Cardiovascular Disease

## 2012-12-03 DIAGNOSIS — I4891 Unspecified atrial fibrillation: Secondary | ICD-10-CM

## 2012-12-03 DIAGNOSIS — Z7901 Long term (current) use of anticoagulants: Secondary | ICD-10-CM

## 2012-12-18 ENCOUNTER — Ambulatory Visit (INDEPENDENT_AMBULATORY_CARE_PROVIDER_SITE_OTHER): Payer: MEDICARE | Admitting: Pharmacist

## 2012-12-18 DIAGNOSIS — I4891 Unspecified atrial fibrillation: Secondary | ICD-10-CM

## 2012-12-18 DIAGNOSIS — Z7901 Long term (current) use of anticoagulants: Secondary | ICD-10-CM

## 2013-01-06 ENCOUNTER — Other Ambulatory Visit: Payer: Self-pay | Admitting: *Deleted

## 2013-01-06 MED ORDER — WARFARIN SODIUM 5 MG PO TABS: 5.0000 mg | ORAL_TABLET | ORAL | Status: DC

## 2013-01-09 LAB — PROTIME-INR

## 2013-01-12 ENCOUNTER — Ambulatory Visit (INDEPENDENT_AMBULATORY_CARE_PROVIDER_SITE_OTHER): Payer: MEDICARE | Admitting: Pharmacist

## 2013-01-12 DIAGNOSIS — I4891 Unspecified atrial fibrillation: Secondary | ICD-10-CM

## 2013-01-12 DIAGNOSIS — Z7901 Long term (current) use of anticoagulants: Secondary | ICD-10-CM

## 2013-01-12 LAB — PROTIME-INR: INR: 1.5 — AB (ref <=1.1)

## 2013-01-29 LAB — PROTIME-INR: INR: 2.5 — AB (ref 0.9–1.1)

## 2013-02-02 ENCOUNTER — Ambulatory Visit (INDEPENDENT_AMBULATORY_CARE_PROVIDER_SITE_OTHER): Payer: MEDICARE | Admitting: Pharmacist

## 2013-02-02 DIAGNOSIS — Z7901 Long term (current) use of anticoagulants: Secondary | ICD-10-CM

## 2013-02-02 DIAGNOSIS — I4891 Unspecified atrial fibrillation: Secondary | ICD-10-CM

## 2013-02-19 LAB — PROTIME-INR: INR: 2.8 — AB (ref 0.9–1.1)

## 2013-02-23 ENCOUNTER — Ambulatory Visit (INDEPENDENT_AMBULATORY_CARE_PROVIDER_SITE_OTHER): Payer: MEDICARE | Admitting: Pharmacist

## 2013-02-23 DIAGNOSIS — Z7901 Long term (current) use of anticoagulants: Secondary | ICD-10-CM

## 2013-02-23 DIAGNOSIS — I4891 Unspecified atrial fibrillation: Secondary | ICD-10-CM

## 2013-03-19 LAB — PROTIME-INR: INR: 2.9 — AB (ref 0.9–1.1)

## 2013-03-23 ENCOUNTER — Ambulatory Visit (INDEPENDENT_AMBULATORY_CARE_PROVIDER_SITE_OTHER): Payer: MEDICARE | Admitting: Pharmacist

## 2013-03-23 DIAGNOSIS — Z7901 Long term (current) use of anticoagulants: Secondary | ICD-10-CM

## 2013-03-23 DIAGNOSIS — I4891 Unspecified atrial fibrillation: Secondary | ICD-10-CM

## 2013-04-02 LAB — PROTIME-INR: INR: 3.3 — AB (ref 0.9–1.1)

## 2013-04-03 ENCOUNTER — Ambulatory Visit (INDEPENDENT_AMBULATORY_CARE_PROVIDER_SITE_OTHER): Payer: MEDICARE | Admitting: Cardiology

## 2013-04-03 DIAGNOSIS — I4891 Unspecified atrial fibrillation: Secondary | ICD-10-CM

## 2013-04-03 DIAGNOSIS — Z7901 Long term (current) use of anticoagulants: Secondary | ICD-10-CM

## 2013-04-16 LAB — PROTIME-INR: INR: 2.4 — AB (ref 0.9–1.1)

## 2013-04-17 ENCOUNTER — Ambulatory Visit (INDEPENDENT_AMBULATORY_CARE_PROVIDER_SITE_OTHER): Payer: MEDICARE | Admitting: Cardiology

## 2013-04-17 DIAGNOSIS — I4891 Unspecified atrial fibrillation: Secondary | ICD-10-CM

## 2013-04-17 DIAGNOSIS — Z7901 Long term (current) use of anticoagulants: Secondary | ICD-10-CM

## 2013-05-07 LAB — PROTIME-INR: INR: 2.3 — AB (ref 0.9–1.1)

## 2013-05-08 ENCOUNTER — Ambulatory Visit (INDEPENDENT_AMBULATORY_CARE_PROVIDER_SITE_OTHER): Payer: MEDICARE | Admitting: Pharmacist

## 2013-05-08 DIAGNOSIS — Z7901 Long term (current) use of anticoagulants: Secondary | ICD-10-CM

## 2013-05-08 DIAGNOSIS — I4891 Unspecified atrial fibrillation: Secondary | ICD-10-CM

## 2013-05-25 ENCOUNTER — Other Ambulatory Visit: Payer: Self-pay | Admitting: *Deleted

## 2013-05-25 ENCOUNTER — Telehealth: Payer: Self-pay | Admitting: *Deleted

## 2013-05-25 MED ORDER — WARFARIN SODIUM 5 MG PO TABS: ORAL_TABLET | ORAL | Status: DC

## 2013-05-25 NOTE — Telephone Encounter (Signed)
Patients wife requests coumadin refill for patient be sent to [facility] on [address]. Thanks, MI

## 2013-05-28 LAB — PROTIME-INR: INR: 2.1 — AB (ref 0.9–1.1)

## 2013-06-02 ENCOUNTER — Ambulatory Visit (INDEPENDENT_AMBULATORY_CARE_PROVIDER_SITE_OTHER): Payer: MEDICARE | Admitting: Internal Medicine

## 2013-06-02 DIAGNOSIS — I4891 Unspecified atrial fibrillation: Secondary | ICD-10-CM

## 2013-06-02 DIAGNOSIS — Z7901 Long term (current) use of anticoagulants: Secondary | ICD-10-CM

## 2013-06-04 LAB — PROTIME-INR: INR: 1.8 — AB (ref 0.9–1.1)

## 2013-06-05 ENCOUNTER — Ambulatory Visit (INDEPENDENT_AMBULATORY_CARE_PROVIDER_SITE_OTHER): Payer: MEDICARE | Admitting: Internal Medicine

## 2013-06-05 DIAGNOSIS — Z7901 Long term (current) use of anticoagulants: Secondary | ICD-10-CM

## 2013-06-05 DIAGNOSIS — I4891 Unspecified atrial fibrillation: Secondary | ICD-10-CM

## 2013-06-17 ENCOUNTER — Encounter: Payer: Self-pay | Admitting: Internal Medicine

## 2013-06-19 ENCOUNTER — Ambulatory Visit (INDEPENDENT_AMBULATORY_CARE_PROVIDER_SITE_OTHER): Payer: MEDICARE | Admitting: Cardiology

## 2013-06-19 DIAGNOSIS — Z7901 Long term (current) use of anticoagulants: Secondary | ICD-10-CM

## 2013-06-19 DIAGNOSIS — I4891 Unspecified atrial fibrillation: Secondary | ICD-10-CM

## 2013-06-19 LAB — PROTIME-INR: INR: 2.4 — AB (ref 0.9–1.1)

## 2013-06-30 LAB — PROTIME-INR: INR: 3.1 — AB (ref 0.9–1.1)

## 2013-07-01 ENCOUNTER — Ambulatory Visit (INDEPENDENT_AMBULATORY_CARE_PROVIDER_SITE_OTHER): Payer: MEDICARE | Admitting: Cardiology

## 2013-07-01 DIAGNOSIS — Z7901 Long term (current) use of anticoagulants: Secondary | ICD-10-CM

## 2013-07-25 LAB — PROTIME-INR: INR: 2.4 — AB (ref 0.9–1.1)

## 2013-07-28 ENCOUNTER — Ambulatory Visit (INDEPENDENT_AMBULATORY_CARE_PROVIDER_SITE_OTHER): Payer: MEDICARE | Admitting: Cardiology

## 2013-07-28 DIAGNOSIS — Z7901 Long term (current) use of anticoagulants: Secondary | ICD-10-CM

## 2013-07-30 LAB — PROTIME-INR: INR: 2.4 — AB (ref 0.9–1.1)

## 2013-08-03 ENCOUNTER — Ambulatory Visit (INDEPENDENT_AMBULATORY_CARE_PROVIDER_SITE_OTHER): Payer: MEDICARE | Admitting: Pharmacist

## 2013-08-03 DIAGNOSIS — Z7901 Long term (current) use of anticoagulants: Secondary | ICD-10-CM

## 2013-08-03 DIAGNOSIS — I4891 Unspecified atrial fibrillation: Secondary | ICD-10-CM

## 2013-08-06 LAB — PROTIME-INR: INR: 2.2 — AB (ref 0.9–1.1)

## 2013-08-12 ENCOUNTER — Ambulatory Visit (INDEPENDENT_AMBULATORY_CARE_PROVIDER_SITE_OTHER): Payer: MEDICARE | Admitting: Cardiology

## 2013-08-12 DIAGNOSIS — Z5181 Encounter for therapeutic drug level monitoring: Secondary | ICD-10-CM

## 2013-08-12 DIAGNOSIS — Z7901 Long term (current) use of anticoagulants: Secondary | ICD-10-CM

## 2013-08-20 LAB — PROTIME-INR: INR: 2.9 — AB (ref 0.9–1.1)

## 2013-08-21 ENCOUNTER — Ambulatory Visit (INDEPENDENT_AMBULATORY_CARE_PROVIDER_SITE_OTHER): Payer: MEDICARE | Admitting: Cardiology

## 2013-08-21 DIAGNOSIS — Z5181 Encounter for therapeutic drug level monitoring: Secondary | ICD-10-CM

## 2013-09-11 ENCOUNTER — Ambulatory Visit (INDEPENDENT_AMBULATORY_CARE_PROVIDER_SITE_OTHER): Payer: MEDICARE | Admitting: Cardiology

## 2013-09-11 DIAGNOSIS — Z5181 Encounter for therapeutic drug level monitoring: Secondary | ICD-10-CM

## 2013-09-11 DIAGNOSIS — I4891 Unspecified atrial fibrillation: Secondary | ICD-10-CM

## 2013-09-11 LAB — PROTIME-INR: INR: 3.2 — AB (ref 0.9–1.1)

## 2013-09-17 LAB — PROTIME-INR: INR: 2.8 — AB (ref <=1.1)

## 2013-09-18 ENCOUNTER — Encounter: Payer: Self-pay | Admitting: Cardiology

## 2013-09-18 DIAGNOSIS — Z5181 Encounter for therapeutic drug level monitoring: Secondary | ICD-10-CM

## 2013-09-18 NOTE — Progress Notes (Signed)
This encounter was created in error - please disregard.

## 2013-09-24 LAB — PROTIME-INR: INR: 3.7 — AB (ref 0.9–1.1)

## 2013-09-25 ENCOUNTER — Ambulatory Visit (INDEPENDENT_AMBULATORY_CARE_PROVIDER_SITE_OTHER): Payer: MEDICARE | Admitting: Internal Medicine

## 2013-09-25 DIAGNOSIS — Z5181 Encounter for therapeutic drug level monitoring: Secondary | ICD-10-CM

## 2013-10-04 ENCOUNTER — Emergency Department (HOSPITAL_COMMUNITY): Payer: MEDICARE

## 2013-10-04 ENCOUNTER — Inpatient Hospital Stay (HOSPITAL_COMMUNITY)
Admission: EM | Admit: 2013-10-04 | Discharge: 2013-10-09 | DRG: 871 | Disposition: A | Discharge status: Home / Self Care | Payer: MEDICARE | Attending: Internal Medicine | Admitting: Internal Medicine

## 2013-10-04 ENCOUNTER — Encounter (HOSPITAL_COMMUNITY): Payer: Self-pay | Admitting: Emergency Medicine

## 2013-10-04 DIAGNOSIS — E872 Acidosis: Secondary | ICD-10-CM | POA: Diagnosis present

## 2013-10-04 DIAGNOSIS — E1140 Type 2 diabetes mellitus with diabetic neuropathy, unspecified: Secondary | ICD-10-CM | POA: Diagnosis present

## 2013-10-04 DIAGNOSIS — I120 Hypertensive chronic kidney disease with stage 5 chronic kidney disease or end stage renal disease: Secondary | ICD-10-CM | POA: Diagnosis present

## 2013-10-04 DIAGNOSIS — Z992 Dependence on renal dialysis: Secondary | ICD-10-CM | POA: Diagnosis not present

## 2013-10-04 DIAGNOSIS — F0390 Unspecified dementia without behavioral disturbance: Secondary | ICD-10-CM | POA: Diagnosis present

## 2013-10-04 DIAGNOSIS — E1129 Type 2 diabetes mellitus with other diabetic kidney complication: Secondary | ICD-10-CM | POA: Diagnosis present

## 2013-10-04 DIAGNOSIS — Z7901 Long term (current) use of anticoagulants: Secondary | ICD-10-CM | POA: Diagnosis not present

## 2013-10-04 DIAGNOSIS — I2510 Atherosclerotic heart disease of native coronary artery without angina pectoris: Secondary | ICD-10-CM | POA: Diagnosis present

## 2013-10-04 DIAGNOSIS — I9589 Other hypotension: Secondary | ICD-10-CM | POA: Diagnosis present

## 2013-10-04 DIAGNOSIS — Z951 Presence of aortocoronary bypass graft: Secondary | ICD-10-CM

## 2013-10-04 DIAGNOSIS — A412 Sepsis due to unspecified staphylococcus: Principal | ICD-10-CM | POA: Diagnosis present

## 2013-10-04 DIAGNOSIS — R000 Tachycardia, unspecified: Secondary | ICD-10-CM

## 2013-10-04 DIAGNOSIS — E785 Hyperlipidemia, unspecified: Secondary | ICD-10-CM | POA: Diagnosis present

## 2013-10-04 DIAGNOSIS — D631 Anemia in chronic kidney disease: Secondary | ICD-10-CM | POA: Diagnosis present

## 2013-10-04 DIAGNOSIS — I480 Paroxysmal atrial fibrillation: Secondary | ICD-10-CM

## 2013-10-04 DIAGNOSIS — R55 Syncope and collapse: Secondary | ICD-10-CM

## 2013-10-04 DIAGNOSIS — I951 Orthostatic hypotension: Secondary | ICD-10-CM

## 2013-10-04 DIAGNOSIS — I25810 Atherosclerosis of coronary artery bypass graft(s) without angina pectoris: Secondary | ICD-10-CM

## 2013-10-04 DIAGNOSIS — G928 Other toxic encephalopathy: Secondary | ICD-10-CM

## 2013-10-04 DIAGNOSIS — Z720 Tobacco use: Secondary | ICD-10-CM

## 2013-10-04 DIAGNOSIS — I70269 Atherosclerosis of native arteries of extremities with gangrene, unspecified extremity: Secondary | ICD-10-CM

## 2013-10-04 DIAGNOSIS — Z5189 Encounter for other specified aftercare: Secondary | ICD-10-CM

## 2013-10-04 DIAGNOSIS — E1122 Type 2 diabetes mellitus with diabetic chronic kidney disease: Secondary | ICD-10-CM

## 2013-10-04 DIAGNOSIS — R6510 Systemic inflammatory response syndrome (SIRS) of non-infectious origin without acute organ dysfunction: Secondary | ICD-10-CM

## 2013-10-04 DIAGNOSIS — F17220 Nicotine dependence, chewing tobacco, uncomplicated: Secondary | ICD-10-CM | POA: Diagnosis present

## 2013-10-04 DIAGNOSIS — N186 End stage renal disease: Secondary | ICD-10-CM

## 2013-10-04 DIAGNOSIS — G9340 Encephalopathy, unspecified: Secondary | ICD-10-CM

## 2013-10-04 DIAGNOSIS — I350 Nonrheumatic aortic (valve) stenosis: Secondary | ICD-10-CM | POA: Diagnosis present

## 2013-10-04 DIAGNOSIS — E860 Dehydration: Secondary | ICD-10-CM | POA: Diagnosis present

## 2013-10-04 DIAGNOSIS — R509 Fever, unspecified: Secondary | ICD-10-CM

## 2013-10-04 DIAGNOSIS — K7200 Acute and subacute hepatic failure without coma: Secondary | ICD-10-CM | POA: Diagnosis present

## 2013-10-04 DIAGNOSIS — K529 Noninfective gastroenteritis and colitis, unspecified: Secondary | ICD-10-CM | POA: Diagnosis present

## 2013-10-04 DIAGNOSIS — R7401 Elevation of levels of liver transaminase levels: Secondary | ICD-10-CM

## 2013-10-04 DIAGNOSIS — R404 Transient alteration of awareness: Secondary | ICD-10-CM

## 2013-10-04 DIAGNOSIS — Z5181 Encounter for therapeutic drug level monitoring: Secondary | ICD-10-CM

## 2013-10-04 DIAGNOSIS — A419 Sepsis, unspecified organism: Secondary | ICD-10-CM

## 2013-10-04 DIAGNOSIS — I482 Chronic atrial fibrillation, unspecified: Secondary | ICD-10-CM

## 2013-10-04 DIAGNOSIS — R7881 Bacteremia: Secondary | ICD-10-CM

## 2013-10-04 DIAGNOSIS — I4891 Unspecified atrial fibrillation: Secondary | ICD-10-CM | POA: Diagnosis present

## 2013-10-04 DIAGNOSIS — Z89519 Acquired absence of unspecified leg below knee: Secondary | ICD-10-CM

## 2013-10-04 DIAGNOSIS — R740 Nonspecific elevation of levels of transaminase and lactic acid dehydrogenase [LDH]: Secondary | ICD-10-CM

## 2013-10-04 DIAGNOSIS — Z01810 Encounter for preprocedural cardiovascular examination: Secondary | ICD-10-CM

## 2013-10-04 DIAGNOSIS — G92 Toxic encephalopathy: Secondary | ICD-10-CM

## 2013-10-04 LAB — GLUCOSE, CAPILLARY
GLUCOSE-CAPILLARY: 168 mg/dL — AB (ref 70–99)
GLUCOSE-CAPILLARY: 104 mg/dL — AB (ref 70–99)
Glucose-Capillary: 196 mg/dL — ABNORMAL HIGH (ref 70–99)
Glucose-Capillary: 171 mg/dL — ABNORMAL HIGH (ref 70–99)

## 2013-10-04 LAB — HEPATITIS PANEL, ACUTE
HCV Ab: NEGATIVE
HEP B C IGM: NONREACTIVE
Hep A IgM: NONREACTIVE
Hepatitis B Surface Ag: NEGATIVE

## 2013-10-04 LAB — CBC WITH DIFFERENTIAL/PLATELET
BASOS ABS: 0 10*3/uL (ref 0.0–0.1)
BASOS PCT: 0 % (ref 0–1)
EOS ABS: 0 10*3/uL (ref 0.0–0.7)
Eosinophils Relative: 0 % (ref 0–5)
HCT: 41 % (ref 39.0–52.0)
HEMOGLOBIN: 12.9 g/dL — AB (ref 13.0–17.0)
Lymphocytes Relative: 3 % — ABNORMAL LOW (ref 12–46)
Lymphs Abs: 0.2 10*3/uL — ABNORMAL LOW (ref 0.7–4.0)
MCH: 29.6 pg (ref 26.0–34.0)
MCHC: 31.5 g/dL (ref 30.0–36.0)
MCV: 94 fL (ref 78.0–100.0)
Monocytes Absolute: 0.4 10*3/uL (ref 0.1–1.0)
Monocytes Relative: 5 % (ref 3–12)
NEUTROS PCT: 92 % — AB (ref 43–77)
Neutro Abs: 8 10*3/uL — ABNORMAL HIGH (ref 1.7–7.7)
PLATELETS: 198 10*3/uL (ref 150–400)
RBC: 4.36 MIL/uL (ref 4.22–5.81)
RDW: 13.3 % (ref 11.5–15.5)
WBC: 8.7 10*3/uL (ref 4.0–10.5)

## 2013-10-04 LAB — LACTIC ACID, PLASMA: LACTIC ACID, VENOUS: 2.9 mmol/L — AB (ref 0.5–2.2)

## 2013-10-04 LAB — COMPREHENSIVE METABOLIC PANEL
ALBUMIN: 3.7 g/dL (ref 3.5–5.2)
ALK PHOS: 137 U/L — AB (ref 39–117)
ALT: 577 U/L — ABNORMAL HIGH (ref 0–53)
AST: 913 U/L — ABNORMAL HIGH (ref 0–37)
Anion gap: 19 — ABNORMAL HIGH (ref 5–15)
BUN: 19 mg/dL (ref 6–23)
CHLORIDE: 94 meq/L — AB (ref 96–112)
CO2: 25 mEq/L (ref 19–32)
Calcium: 9 mg/dL (ref 8.4–10.5)
Creatinine, Ser: 5.49 mg/dL — ABNORMAL HIGH (ref 0.50–1.35)
GFR calc Af Amer: 10 mL/min — ABNORMAL LOW (ref >90)
GFR calc non Af Amer: 9 mL/min — ABNORMAL LOW (ref >90)
Glucose, Bld: 214 mg/dL — ABNORMAL HIGH (ref 70–99)
Potassium: 3.8 mEq/L (ref 3.7–5.3)
SODIUM: 138 meq/L (ref 137–147)
Total Bilirubin: 2 mg/dL — ABNORMAL HIGH (ref 0.3–1.2)
Total Protein: 7.7 g/dL (ref 6.0–8.3)

## 2013-10-04 LAB — MRSA PCR SCREENING: MRSA by PCR: NEGATIVE

## 2013-10-04 LAB — I-STAT TROPONIN, ED: TROPONIN I, POC: 0.04 ng/mL (ref 0.00–0.08)

## 2013-10-04 LAB — BILIRUBIN, DIRECT: BILIRUBIN DIRECT: 0.6 mg/dL — AB (ref 0.0–0.3)

## 2013-10-04 LAB — CBG MONITORING, ED: Glucose-Capillary: 223 mg/dL — ABNORMAL HIGH (ref 70–99)

## 2013-10-04 LAB — I-STAT CHEM 8, ED
BUN: 20 mg/dL (ref 6–23)
Calcium, Ion: 0.95 mmol/L — ABNORMAL LOW (ref 1.13–1.30)
Chloride: 98 mEq/L (ref 96–112)
Creatinine, Ser: 5.6 mg/dL — ABNORMAL HIGH (ref 0.50–1.35)
GLUCOSE: 215 mg/dL — AB (ref 70–99)
HEMATOCRIT: 44 % (ref 39.0–52.0)
HEMOGLOBIN: 15 g/dL (ref 13.0–17.0)
POTASSIUM: 3.7 meq/L (ref 3.7–5.3)
Sodium: 136 mEq/L — ABNORMAL LOW (ref 137–147)
TCO2: 26 mmol/L (ref 0–100)

## 2013-10-04 LAB — PROTIME-INR
INR: 1.93 — ABNORMAL HIGH (ref 0.00–1.49)
Prothrombin Time: 22.1 seconds — ABNORMAL HIGH (ref 11.6–15.2)

## 2013-10-04 LAB — TROPONIN I

## 2013-10-04 LAB — I-STAT CG4 LACTIC ACID, ED: Lactic Acid, Venous: 4.13 mmol/L — ABNORMAL HIGH (ref 0.5–2.2)

## 2013-10-04 LAB — CLOSTRIDIUM DIFFICILE BY PCR: Toxigenic C. Difficile by PCR: NEGATIVE

## 2013-10-04 MED ORDER — WARFARIN SODIUM 5 MG PO TABS
5.0000 mg | ORAL_TABLET | Freq: Once | ORAL | Status: AC
Start: 2013-10-04 — End: 2013-10-04
  Administered 2013-10-04: 5 mg via ORAL
  Filled 2013-10-04: qty 1

## 2013-10-04 MED ORDER — WARFARIN - PHARMACIST DOSING INPATIENT
Freq: Every day | Status: DC
  Administered 2013-10-04: 18:00:00

## 2013-10-04 MED ORDER — SIMVASTATIN 20 MG PO TABS
20.0000 mg | ORAL_TABLET | Freq: Every day | ORAL | Status: DC
  Filled 2013-10-04: qty 1

## 2013-10-04 MED ORDER — SODIUM CHLORIDE 0.9 % IV SOLN: INTRAVENOUS | Status: DC

## 2013-10-04 MED ORDER — PIPERACILLIN-TAZOBACTAM 3.375 G IVPB 30 MIN
3.3750 g | Freq: Once | INTRAVENOUS | Status: AC
  Administered 2013-10-04: 3.375 g via INTRAVENOUS
  Filled 2013-10-04: qty 50

## 2013-10-04 MED ORDER — INSULIN ASPART 100 UNIT/ML ~~LOC~~ SOLN
0.0000 [IU] | Freq: Three times a day (TID) | SUBCUTANEOUS | Status: DC
  Administered 2013-10-04 – 2013-10-05 (×4): 2 [IU] via SUBCUTANEOUS
  Administered 2013-10-06: 3 [IU] via SUBCUTANEOUS
  Administered 2013-10-07 (×2): 2 [IU] via SUBCUTANEOUS
  Administered 2013-10-08: 3 [IU] via SUBCUTANEOUS
  Administered 2013-10-08: 1 [IU] via SUBCUTANEOUS

## 2013-10-04 MED ORDER — INFLUENZA VAC SPLIT QUAD 0.5 ML IM SUSY
0.5000 mL | PREFILLED_SYRINGE | INTRAMUSCULAR | Status: AC
  Administered 2013-10-05: 0.5 mL via INTRAMUSCULAR
  Filled 2013-10-04: qty 0.5

## 2013-10-04 MED ORDER — RENA-VITE PO TABS
1.0000 | ORAL_TABLET | Freq: Every day | ORAL | Status: DC
  Filled 2013-10-04: qty 1

## 2013-10-04 MED ORDER — PIPERACILLIN-TAZOBACTAM IN DEX 2-0.25 GM/50ML IV SOLN
2.2500 g | Freq: Three times a day (TID) | INTRAVENOUS | Status: DC
  Filled 2013-10-04 (×2): qty 50

## 2013-10-04 MED ORDER — ALLOPURINOL 150 MG HALF TABLET
150.0000 mg | ORAL_TABLET | Freq: Every day | ORAL | Status: DC
  Filled 2013-10-04: qty 1

## 2013-10-04 MED ORDER — GABAPENTIN 100 MG PO CAPS
100.0000 mg | ORAL_CAPSULE | Freq: Every day | ORAL | Status: DC
  Administered 2013-10-04 – 2013-10-08 (×5): 100 mg via ORAL
  Filled 2013-10-04 (×8): qty 1

## 2013-10-04 MED ORDER — ASPIRIN EC 81 MG PO TBEC
81.0000 mg | DELAYED_RELEASE_TABLET | Freq: Every day | ORAL | Status: DC
  Administered 2013-10-04 – 2013-10-09 (×6): 81 mg via ORAL
  Filled 2013-10-04 (×6): qty 1

## 2013-10-04 MED ORDER — SODIUM CHLORIDE 0.9 % IJ SOLN
3.0000 mL | Freq: Two times a day (BID) | INTRAMUSCULAR | Status: DC
  Administered 2013-10-04 – 2013-10-08 (×9): 3 mL via INTRAVENOUS

## 2013-10-04 MED ORDER — SODIUM CHLORIDE 0.9 % IV BOLUS (SEPSIS)
1000.0000 mL | Freq: Once | INTRAVENOUS | Status: AC
  Administered 2013-10-04: 1000 mL via INTRAVENOUS

## 2013-10-04 MED ORDER — VANCOMYCIN HCL IN DEXTROSE 1-5 GM/200ML-% IV SOLN
1000.0000 mg | Freq: Once | INTRAVENOUS | Status: AC
  Administered 2013-10-04: 1000 mg via INTRAVENOUS
  Filled 2013-10-04: qty 200

## 2013-10-04 MED ORDER — PNEUMOCOCCAL 13-VAL CONJ VACC IM SUSP
0.5000 mL | INTRAMUSCULAR | Status: DC
  Filled 2013-10-04: qty 0.5

## 2013-10-04 MED ORDER — SODIUM CHLORIDE 0.9 % IJ SOLN
3.0000 mL | INTRAMUSCULAR | Status: DC | PRN
  Administered 2013-10-07 – 2013-10-08 (×2): 3 mL via INTRAVENOUS

## 2013-10-04 MED ORDER — SODIUM CHLORIDE 0.9 % IJ SOLN
3.0000 mL | Freq: Two times a day (BID) | INTRAMUSCULAR | Status: DC
  Administered 2013-10-04 (×2): 3 mL via INTRAVENOUS

## 2013-10-04 MED ORDER — SODIUM CHLORIDE 0.9 % IV SOLN
250.0000 mL | INTRAVENOUS | Status: DC | PRN
Start: 2013-10-04 — End: 2013-10-09

## 2013-10-04 MED ORDER — SODIUM CHLORIDE 0.9 % IV BOLUS (SEPSIS)
250.0000 mL | Freq: Once | INTRAVENOUS | Status: AC
  Administered 2013-10-04: 250 mL via INTRAVENOUS

## 2013-10-04 MED ORDER — CALCIUM CARBONATE ANTACID 500 MG PO CHEW
1.0000 | CHEWABLE_TABLET | Freq: Three times a day (TID) | ORAL | Status: DC
  Administered 2013-10-04 – 2013-10-09 (×11): 200 mg via ORAL
  Filled 2013-10-04 (×19): qty 1

## 2013-10-04 MED ORDER — MIDODRINE HCL 5 MG PO TABS
10.0000 mg | ORAL_TABLET | Freq: Three times a day (TID) | ORAL | Status: DC
  Administered 2013-10-04 – 2013-10-09 (×16): 10 mg via ORAL
  Filled 2013-10-04 (×20): qty 2

## 2013-10-04 NOTE — ED Notes (Signed)
Attempted to phone report

## 2013-10-04 NOTE — Progress Notes (Signed)
ANTIBIOTIC CONSULT NOTE - INITIAL  Pharmacy Consult for Zosyn Indication: Sepsis   Allergies  Allergen Reactions  . Ancef [Cefazolin] Rash    Patient cant recall if allergy to this med exists.     Patient Measurements:     Vital Signs: Temp: 100.8 F (38.2 C) (09/27 0245) Temp src: Rectal (09/27 0245) BP: 100/47 mmHg (09/27 0232) Pulse Rate: 117 (09/27 0232) Intake/Output from previous day:   Intake/Output from this shift:    Labs:  Recent Labs  10/04/13 0214 10/04/13 0229  WBC 8.7  --   HGB 12.9* 15.0  PLT 198  --   CREATININE 5.49* 5.60*   The CrCl is unknown because both a height and weight (above a minimum accepted value) are required for this calculation. No results found for this basename: VANCOTROUGH, VANCOPEAK, VANCORANDOM, GENTTROUGH, GENTPEAK, GENTRANDOM, TOBRATROUGH, TOBRAPEAK, TOBRARND, AMIKACINPEAK, AMIKACINTROU, AMIKACIN,  in the last 72 hours   Microbiology: No results found for this or any previous visit (from the past 720 hour(s)).  Medical History: Past Medical History  Diagnosis Date  . Diabetes mellitus   . Gout   . Hypertension   . Hyperlipidemia   . Arthritis   . Prostate cancer     a. s/p seed implant  . Cataract     a. s/p bilat cataract surgeries.  . Diabetic neuropathy   . Atrial fibrillation     a. chronic/rate controlled;  b. on coumadin  . Moderate aortic stenosis     a. 05/2012 Echo: EF 55-60%, mod AS, mildly dil LA - prev not felt to be a surgical candidate  . Anemia of chronic disease   . Coronary artery disease     a. remote CABG x 5 in 1996; b. 04/2003 Cath: LAD 100p, LCX min irregs, RCA 81m, VG->D1 nl, VG->AM->PDA->RPL nl, LIMA->LAD nl, EF 60%.  . Edema   . PAD (peripheral artery disease)     a. s/p bilat BKA's (L - 07/2011, R - 02/2012).  . Diabetic retinopathy   . Chronic anticoagulation     a. on coumadin  . End stage renal disease     a. Started dialysis October 2012  . Nephrolithiasis     a. prior history of  percutaneous nephrostomy  . Orthostatic hypotension     a. on midodrine    Medications:   (Not in a hospital admission) Assessment: 30 YOM who was brought to the ED after an unresponsive episode at home. Patient had HD on 9/26 prior to hospitalization. Pharmacy consulted to start Zosyn for sepsis. He also received a dose of Vancomycin in the ED. WBC is wnl. Tm 100.39F. LA elevated at 4.13.   Cultures: 9/27 BLood Cx x2>>   Goal of Therapy:  Resolution of infection   Plan:  1) Zosyn 3.375 gm IV x 1 dose followed by Zosyn 2.25 gm IV Q 8 hours.  2) Monitor CBC, renal fx, cultures and patient's clinical progress 3) F/u if Vancomycin is continued on admission.   Albertina Parr, PharmD.  Clinical Pharmacist Pager [phone]

## 2013-10-04 NOTE — Progress Notes (Signed)
ANTICOAGULATION CONSULT NOTE - Initial Consult  Pharmacy Consult for warfarin Indication: atrial fibrillation  Allergies  Allergen Reactions  . Ancef [Cefazolin] Rash    Patient cant recall if allergy to this med exists.     Patient Measurements: Height: 5' (152.4 cm) Weight: 218 lb 14.7 oz (99.3 kg) IBW/kg (Calculated) : 50 Heparin Dosing Weight:   Vital Signs: Temp: 98.2 F (36.8 C) (09/27 1246) Temp src: Oral (09/27 1246) BP: 95/47 mmHg (09/27 1100) Pulse Rate: 110 (09/27 1246)  Labs:  Recent Labs  10/04/13 0214 10/04/13 0229  HGB 12.9* 15.0  HCT 41.0 44.0  PLT 198  --   LABPROT 22.1*  --   INR 1.93*  --   CREATININE 5.49* 5.60*  TROPONINI <0.30  --     Estimated Creatinine Clearance: 10.5 ml/min (by C-G formula based on Cr of 5.6).   Medical History: Past Medical History  Diagnosis Date  . Diabetes mellitus   . Gout   . Hypertension   . Hyperlipidemia   . Arthritis   . Prostate cancer     a. s/p seed implant  . Cataract     a. s/p bilat cataract surgeries.  . Diabetic neuropathy   . Atrial fibrillation     a. chronic/rate controlled;  b. on coumadin  . Moderate aortic stenosis     a. 05/2012 Echo: EF 55-60%, mod AS, mildly dil LA - prev not felt to be a surgical candidate  . Anemia of chronic disease   . Coronary artery disease     a. remote CABG x 5 in 1996; b. 04/2003 Cath: LAD 100p, LCX min irregs, RCA 3m, VG->D1 nl, VG->AM->PDA->RPL nl, LIMA->LAD nl, EF 60%.  . Edema   . PAD (peripheral artery disease)     a. s/p bilat BKA's (L - 07/2011, R - 02/2012).  . Diabetic retinopathy   . Chronic anticoagulation     a. on coumadin  . End stage renal disease     a. Started dialysis October 2012  . Nephrolithiasis     a. prior history of percutaneous nephrostomy  . Orthostatic hypotension     a. on midodrine    Medications:  See EMR  Assessment: 80 yo male admitted for N/V/D. On warfarin prior to admission for AFib. PTA dose: 2.5 mg qWed, 5 mg  on all other days.  Current INR slightly subtherapeutic at 1.9.  Goal of Therapy:  INR 2-3 Monitor platelets by anticoagulation protocol: Yes   Plan:  -Warfarin 5 mg po x1 -Daily INR   Hughes Better, PharmD, BCPS Clinical Pharmacist Pager: [phone] 10/04/2013 12:54 PM

## 2013-10-04 NOTE — H&P (Signed)
[facility] History and Physical  Alan [name] NID:[id number] DOB: 1931/03/06 DOA: 10/04/2013  Referring physician: ED physician PCP: Limmie Patricia, MD  Specialists:   Chief Complaint: Nausea, vomiting, diarrhea, mild abdominal pain and altered mental status.  HPI: Alan [name] is a 80 y.o. male with hx of afib on Coumadin, ESRD on HD (T, Thrs, Sat), hx of moderate AS (not a surgical candidate), known orthostatic symptomology on Midodrine, HTN, hyperlipidemia,  bilateral amputations, received dialysis yesterday, who presents with nausea, vomiting, diarrhea and mild abdominal pain and altered mental status.  In yesterday morning, patient started having nausea, vomiting and mild abdominal pain.  He vomited twice without blood in the vomitus. He also had diarrhea with large amount of watery stools for 2 times. At 11:00 AM, he went to do HD. His HD had to be stopped for a while because of blood pressure drop, but finally he could complete the course of HD.  He came home at 5:00 and felt very tired. He did not want to eat dinner as he usually did at about 5:00 to 6:00 PM per his wife. He went to sleep and had a nap for about 2 hours. After he woke up, he patient took his oral medications, including diabetic medication Amaryl, and had his sandwich dinner at about 7:00PM. Then he watched TV together with his wife. At about 11:00 PM, he went to the bed, but he was confused, and could not position himself in a right position in the bed. Actually, he lay across his bed. His wife tried hard to position him in the bed by asking and helping him sit up. When he finally sit up, he sweat diffusely. He is more confused and stared straight in front. He did not fall or had any injury since he has been in bed all the time. He did not complain chest pain and shortness of breath. He did not have weakness, numbness, or tingling sensations in his extremities. No slurred speech, vision change or hearing  loss. When I evaluated the patient, patient was alert. Per his wife, his mental status already came back to his baseline.   His wife called EMS and was brought to the emergency room for further evaluation and treatment. Patient was found to have low blood pressure at 89/34 mmHg, elevated lactic acid level 4.13, elevated transaminases, and fever in ED.  He was admitted to the inpatient for further evaluation and treatment.   Review of Systems: As presented in the history of presenting illness, rest negative.  Where does patient live?  Lives with his wife at home Can patient participate in ADLs? No  Allergy:  Allergies  Allergen Reactions  . Ancef [Cefazolin] Rash    Patient cant recall if allergy to this med exists.     Past Medical History  Diagnosis Date  . Diabetes mellitus   . Gout   . Hypertension   . Hyperlipidemia   . Arthritis   . Prostate cancer     a. s/p seed implant  . Cataract     a. s/p bilat cataract surgeries.  . Diabetic neuropathy   . Atrial fibrillation     a. chronic/rate controlled;  b. on coumadin  . Moderate aortic stenosis     a. 05/2012 Echo: EF 55-60%, mod AS, mildly dil LA - prev not felt to be a surgical candidate  . Anemia of chronic disease   . Coronary artery disease     a. remote CABG x 5  in 1996; b. 04/2003 Cath: LAD 100p, LCX min irregs, RCA 63m, VG->D1 nl, VG->AM->PDA->RPL nl, LIMA->LAD nl, EF 60%.  . Edema   . PAD (peripheral artery disease)     a. s/p bilat BKA's (L - 07/2011, R - 02/2012).  . Diabetic retinopathy   . Chronic anticoagulation     a. on coumadin  . End stage renal disease     a. Started dialysis October 2012  . Nephrolithiasis     a. prior history of percutaneous nephrostomy  . Orthostatic hypotension     a. on midodrine    Past Surgical History  Procedure Laterality Date  . Ventral hernia repair    . Inguinal hernia repair      right side  . Colonoscopy    . Kidney stone surgery    . Polypectomy    .  Tonsillectomy    . Toe surgery      removal little toe right foot  . Cardiac catheterization  04/19/2003    SEVERE 2 VESSEL OBSTRUCTIVE ATHERSCLEROTIC CAD  . Coronary artery bypass graft  1996    LIMA GRAFT TO THE LAD, SEQUENTIAL SAPHENOUS  VEIN GRAFT TO THE FIRST AND SECOND DIAGONIAL BRANCHES, SEQUENTIAL SAPHENOUS VEIN GRAFT TO THE ACUTE MARGINAL, POSTERIOR DESCENDING, AND POSTERIOR LATERAL BRANCHES OF THE RIGHT CORONARY ARTERY  . Radioactive seed implant    . US echocardiography  09/06/2009    EF 55-60%  . Cardiovascular stress test  04/13/2003    EF 54%. EVIDENCE OF ANTERO-APICAL ISCHEMIA. NORMAL LV SIZE AND FUNCTION  . Tee without cardioversion  05/09/2011    Procedure: TRANSESOPHAGEAL ECHOCARDIOGRAM (TEE);  Surgeon: Larey Dresser, MD;  Location: [place];  Service: Cardiovascular;  Laterality: N/A;  . Hernia repair    . Amputation  07/20/2011    Procedure: AMPUTATION BELOW KNEE;  Surgeon: Serafina Mitchell, MD;  Location: [place];  Service: Vascular;  Laterality: Left;  . Amputation  01/25/2012    Procedure: AMPUTATION DIGIT;  Surgeon: Angelia Mould, MD;  Location: [place];  Service: Vascular;  Laterality: Right;  second  . Av fistula placement      right  . Transmetatarsal amputation Right 02/15/2012    Procedure: TRANSMETATARSAL AMPUTATION;  Surgeon: Serafina Mitchell, MD;  Location: [facility] OR;  Service: Vascular;  Laterality: Right;  . Amputation Right 02/29/2012    Procedure: AMPUTATION BELOW KNEE;  Surgeon: Serafina Mitchell, MD;  Location[facility];  Service: Vascular;  Laterality: Right;  Right BKA    Social History:  reports that he quit smoking about 45 years ago. His smoking use included Cigarettes. He smoked 0.00 packs per day for 2 years. He has quit using smokeless tobacco. His smokeless tobacco use included Chew. He reports that he does not drink alcohol or use illicit drugs.  Family History:  Family History  Problem Relation Age of Onset  . Heart disease Father   . Hypertension  Brother   . Stroke Brother   . Diabetes Mother     Pt. not sure, but he thinks she did.     Prior to Admission medications   Medication Sig Start Date End Date Taking? Authorizing Provider  allopurinol (ZYLOPRIM) 300 MG tablet Take 0.5 tablets (150 mg total) by mouth daily. 02/06/12  Yes Daniel J Angiulli, PA-C  aspirin EC 81 MG tablet Take 1 tablet (81 mg total) by mouth daily. 02/06/12  Yes Daniel J Angiulli, PA-C  calcium carbonate (TUMS - DOSED IN MG ELEMENTAL CALCIUM)  500 MG chewable tablet Chew 1 tablet by mouth 3 (three) times daily with meals. Phosphorous binder   Yes Historical Provider, MD  estazolam (PROSOM) 2 MG tablet Take 1 mg by mouth at bedtime as needed (sleep).   Yes Historical Provider, MD  gabapentin (NEURONTIN) 100 MG capsule Take 100 mg by mouth at bedtime.   Yes Historical Provider, MD  glimepiride (AMARYL) 4 MG tablet Take 2 mg by mouth daily before breakfast.   Yes Historical Provider, MD  midodrine (PROAMATINE) 10 MG tablet Take 1 tablet (10 mg total) by mouth Every Tuesday,Thursday,and Saturday with dialysis. 10/15/12  Yes Annita Brod, MD  multivitamin (RENA-VIT) TABS tablet Take 1 tablet by mouth daily.   Yes Historical Provider, MD  simvastatin (ZOCOR) 40 MG tablet Take 20 mg by mouth at bedtime.   Yes Historical Provider, MD  warfarin (COUMADIN) 5 MG tablet Take 2.5-5 mg by mouth See admin instructions. Take 0.5 tablet on Wednesday. Take 1 tablet all other days.   Yes Historical Provider, MD    Physical Exam: Filed Vitals:   10/04/13 0600 10/04/13 0615 10/04/13 0630 10/04/13 0645  BP: 103/26 89/34 105/37 95/41  Pulse: 66 115 116 115  Temp:      TempSrc:      Resp:      SpO2: 97% 98% 97% 96%   General: Not in acute distress HEENT:       Eyes: PERRL, EOMI, no scleral icterus       ENT: No discharge from the ears and nose, no pharynx injection, no tonsillar enlargement.        Neck: No JVD, no bruit, no mass felt. Cardiac: S1/S2, irregularly irregular  rhythm, 3/6 systolic murmur, No gallops or rubs Pulm: Good air movement bilaterally. Clear to auscultation bilaterally. No rales, wheezing, rhonchi or rubs. Abd: Soft, nondistended, mildly tender in the mid abdomen, no rebound pain, no organomegaly, BS present Ext: No edema. Bilateral BKA noted  Musculoskeletal: No joint deformities, erythema, or stiffness, ROM full Skin: No rashes.  Neuro: Alert and oriented X 2 (place and person, but not to time), cranial nerves II-XII grossly intact, muscle strength 5/5 in all extremeties, sensation to light touch intact. Brachial reflex 2+ bilaterally. Knee reflex 1+ bilaterally. Negative Babinski's sign. Normal finger to nose test. Psych: Patient is not psychotic, no suicidal or hemocidal ideation.    Labs on Admission:  Basic Metabolic Panel:  Recent Labs Lab 10/04/13 0214 10/04/13 0229  NA 138 136*  K 3.8 3.7  CL 94* 98  CO2 25  --   GLUCOSE 214* 215*  BUN 19 20  CREATININE 5.49* 5.60*  CALCIUM 9.0  --    Liver Function Tests:  Recent Labs Lab 10/04/13 0214  AST 913*  ALT 577*  ALKPHOS 137*  BILITOT 2.0*  PROT 7.7  ALBUMIN 3.7   No results found for this basename: LIPASE, AMYLASE,  in the last 168 hours No results found for this basename: AMMONIA,  in the last 168 hours CBC:  Recent Labs Lab 10/04/13 0214 10/04/13 0229  WBC 8.7  --   NEUTROABS 8.0*  --   HGB 12.9* 15.0  HCT 41.0 44.0  MCV 94.0  --   PLT 198  --    Cardiac Enzymes:  Recent Labs Lab 10/04/13 0214  TROPONINI <0.30    BNP (last 3 results) No results found for this basename: PROBNP,  in the last 8760 hours CBG:  Recent Labs Lab 10/04/13 [place] 223*  Radiological Exams on Admission: Dg Chest Port 1 View  10/04/2013   CLINICAL DATA:  Near syncope.  Chills.  EXAM: PORTABLE CHEST - 1 VIEW  COMPARISON:  10/09/2012  FINDINGS: Postoperative changes in the mediastinum. Borderline heart size with normal pulmonary vascularity. Lungs are clear.  No blunting of costophrenic angles. No pneumothorax. Tortuous aorta. No change since prior study.  IMPRESSION: No active disease.   Electronically Signed   By: Lucienne Capers M.D.   On: 10/04/2013 02:44   US Abdomen Limited Ruq  10/04/2013   CLINICAL DATA:  Near syncope. Fever. Dialysis patient. Elevated liver function studies.  EXAM: US ABDOMEN LIMITED - RIGHT UPPER QUADRANT  COMPARISON:  None.  FINDINGS: Gallbladder:  A few small stones are demonstrated in the dependent gallbladder. Mild gallbladder wall thickening at 5 mm. Murphy's sign is negative.  Common bile duct:  Diameter: Extrahepatic bile ducts measure up to a maximum about 12 mm diameter. Pancreatic duct is also dilated 2.9 mm. No obstructing mass or stone is demonstrated, but portions of the bowel duct are excluded by bowel gas. Consider further evaluation with elective MRI/MRCP.  Liver:  Mild diffuse increased echotexture suggesting fatty infiltration. No focal lesions identified.  IMPRESSION: Nonspecific findings in the gallbladder with cholelithiasis and mild gallbladder wall thickening. Extrahepatic bile duct dilatation and pancreatic ductal dilatation without cause identified. Consider follow-up with elective MRI/ MRCP.   Electronically Signed   By: Lucienne Capers M.D.   On: 10/04/2013 04:36    EKG: Independently reviewed.   Assessment/Plan Principal Problem:   Acute encephalopathy Active Problems:   Aortic stenosis   CAD (coronary artery disease)   End stage renal disease on dialysis   Chronic anticoagulation   Atrial fibrillation   Hx of BKA   Sepsis   Gastroenteritis  1. Gastroenteritis and sepsis: Patient's nausea, vomiting, abdominal pain, diarrhea are most likely caused by gastroenteritis. It is more likely to be viral gastroenteritis than bacterial etiology given no leukocytosis and mild symptoms. Patient has mild sepsis with elevated lactic acid and mild fever. Vancomycin and Zosyn was started in ED, I will  discontinue these antibiotics for now. Patient blood pressure is soft, 1L normal saline was started in ED. - will admit to step down unit  - IVF: will complete 1L NS which is started in ED, followed by very gentle IVF, 50 cc/h since patient has ESRD and grade II dCHF (currently patient is dry clinically).  - Patient was on the Midodrine 10 mg 3 times a week with HD. Will increase to 10 mg tid for now. Watch bp very carefully and make adjustment accordingly. - Blood culture x 2 - GI virus pathogen pcr - C diff prn  2. Acute encephalopathy: resolved. Like due to sepsis. Also he could have orthostatic status. He looks dehydrated which is secondary to gastroenteritis.  - IVF as above - check orthostatic sign - q4h neuro checks - hold gabapentin and Estazolam for now.  3. Afib: Heart rate is controlled. On Coumadin at home with INR 1.93. - will continue Coumadin per pharmacy  4. CAD (coronary artery disease): s/p of CABG. No chest pain - continue ASA and Zocor - trop x 3  5. ESRD-HD: Cre 5.6. Had HD on Saturday. - may need call Renal if he stays to Tue.  6. elevated transaminases:  AST 913 and ALT 577, total bilirubin 2.0. It is most likely due to sepsis. US-abdo showed onspecific findings in the gallbladder with cholelithiasis. His previous hepatitis panel  was negative. - will repeat hepatitis panel - Followup liver function - get direct Bilirubin   DVT ppx: On coumadin with INR 1.93 on admission  Code Status: Full code Family Communication: Yes, patient's wife at bed side Disposition Plan: Admit to inpatient  Ivor Costa [facility] Pager [phone]  If 7PM-7AM, please contact night-coverage [email or web address] Password [email or web address] 10/04/2013, 7:09 AM

## 2013-10-04 NOTE — ED Provider Notes (Signed)
CSN: [id number]     Arrival date & time 10/04/13  0140 History   First MD Initiated Contact with Patient 10/04/13 0144     Chief Complaint  Patient presents with  . Near Syncope     (Consider location/radiation/quality/duration/timing/severity/associated sxs/prior Treatment) HPI 80 year old male presents to emergency department via EMS from home after an unresponsive episode.  Wife reports that the patient came home from dialysis today around 5 PM and took a nap in his recliner as he normally does.  Wife tried to wake him to go bed around 11, and he was unresponsive.  She reports that he had diarrhea earlier in the day.  She denies any known fever or chills.  She reports when she tried to wake him he was very sweaty cold clammy.  He had his eyes open but did not seem to see her.  She reports that he had episode of vomiting.  EMS reports upon their arrival patient was minimally responsive.  Patient had bigeminy on their monitor.  Patient received some fluid in route, and appeared to regain some color.  At this time patient complains of being very cold.  He denies any chest pain no abdominal pain no nausea no shortness of breath.  No recent change in medications patient has history of diabetes hypertension hyperlipidemia atrial fibrillation on Coumadin, coronary disease peripheral vascular disease status post bilateral BKA's Past Medical History  Diagnosis Date  . Diabetes mellitus   . Gout   . Hypertension   . Hyperlipidemia   . Arthritis   . Prostate cancer     a. s/p seed implant  . Cataract     a. s/p bilat cataract surgeries.  . Diabetic neuropathy   . Atrial fibrillation     a. chronic/rate controlled;  b. on coumadin  . Moderate aortic stenosis     a. 05/2012 Echo: EF 55-60%, mod AS, mildly dil LA - prev not felt to be a surgical candidate  . Anemia of chronic disease   . Coronary artery disease     a. remote CABG x 5 in 1996; b. 04/2003 Cath: LAD 100p, LCX min irregs, RCA 82m,  VG->D1 nl, VG->AM->PDA->RPL nl, LIMA->LAD nl, EF 60%.  . Edema   . PAD (peripheral artery disease)     a. s/p bilat BKA's (L - 07/2011, R - 02/2012).  . Diabetic retinopathy   . Chronic anticoagulation     a. on coumadin  . End stage renal disease     a. Started dialysis October 2012  . Nephrolithiasis     a. prior history of percutaneous nephrostomy  . Orthostatic hypotension     a. on midodrine   Past Surgical History  Procedure Laterality Date  . Ventral hernia repair    . Inguinal hernia repair      right side  . Colonoscopy    . Kidney stone surgery    . Polypectomy    . Tonsillectomy    . Toe surgery      removal little toe right foot  . Cardiac catheterization  04/19/2003    SEVERE 2 VESSEL OBSTRUCTIVE ATHERSCLEROTIC CAD  . Coronary artery bypass graft  1996    LIMA GRAFT TO THE LAD, SEQUENTIAL SAPHENOUS  VEIN GRAFT TO THE FIRST AND SECOND DIAGONIAL BRANCHES, SEQUENTIAL SAPHENOUS VEIN GRAFT TO THE ACUTE MARGINAL, POSTERIOR DESCENDING, AND POSTERIOR LATERAL BRANCHES OF THE RIGHT CORONARY ARTERY  . Radioactive seed implant    . US echocardiography  09/06/2009  EF 55-60%  . Cardiovascular stress test  04/13/2003    EF 54%. EVIDENCE OF ANTERO-APICAL ISCHEMIA. NORMAL LV SIZE AND FUNCTION  . Tee without cardioversion  05/09/2011    Procedure: TRANSESOPHAGEAL ECHOCARDIOGRAM (TEE);  Surgeon: Larey Dresser, MD;  Location: [facility];  Service: Cardiovascular;  Laterality: N/A;  . Hernia repair    . Amputation  07/20/2011    Procedure: AMPUTATION BELOW KNEE;  Surgeon: Serafina Mitchell, MD;  Location: [facility];  Service: Vascular;  Laterality: Left;  . Amputation  01/25/2012    Procedure: AMPUTATION DIGIT;  Surgeon: Angelia Mould, MD;  Location: [facility];  Service: Vascular;  Laterality: Right;  second  . Av fistula placement      right  . Transmetatarsal amputation Right 02/15/2012    Procedure: TRANSMETATARSAL AMPUTATION;  Surgeon: Serafina Mitchell, MD;  Location: [facility] OR;  Service:  Vascular;  Laterality: Right;  . Amputation Right 02/29/2012    Procedure: AMPUTATION BELOW KNEE;  Surgeon: Serafina Mitchell, MD;  Location: [facility] OR;  Service: Vascular;  Laterality: Right;  Right BKA   Family History  Problem Relation Age of Onset  . Heart disease Father   . Hypertension Brother   . Stroke Brother   . Diabetes Mother     Pt. not sure, but he thinks she did.   History  Substance Use Topics  . Smoking status: Former Smoker -- 2 years    Types: Cigarettes    Quit date: 04/10/1968  . Smokeless tobacco: Former Systems developer    Types: Chew  . Alcohol Use: No    Review of Systems  See History of Present Illness; otherwise all other systems are reviewed and negative   Allergies  Ancef  Home Medications   Prior to Admission medications   Medication Sig Start Date End Date Taking? Authorizing Provider  allopurinol (ZYLOPRIM) 300 MG tablet Take 0.5 tablets (150 mg total) by mouth daily. 02/06/12  Yes Daniel J Angiulli, PA-C  aspirin EC 81 MG tablet Take 1 tablet (81 mg total) by mouth daily. 02/06/12  Yes Daniel J Angiulli, PA-C  calcium carbonate (TUMS - DOSED IN MG ELEMENTAL CALCIUM) 500 MG chewable tablet Chew 1 tablet by mouth 3 (three) times daily with meals. Phosphorous binder   Yes Historical Provider, MD  estazolam (PROSOM) 2 MG tablet Take 1 mg by mouth at bedtime as needed (sleep).   Yes Historical Provider, MD  gabapentin (NEURONTIN) 100 MG capsule Take 100 mg by mouth at bedtime.   Yes Historical Provider, MD  glimepiride (AMARYL) 4 MG tablet Take 2 mg by mouth daily before breakfast.   Yes Historical Provider, MD  midodrine (PROAMATINE) 10 MG tablet Take 1 tablet (10 mg total) by mouth Every Tuesday,Thursday,and Saturday with dialysis. 10/15/12  Yes Annita Brod, MD  multivitamin (RENA-VIT) TABS tablet Take 1 tablet by mouth daily.   Yes Historical Provider, MD  simvastatin (ZOCOR) 40 MG tablet Take 20 mg by mouth at bedtime.   Yes Historical Provider, MD  warfarin  (COUMADIN) 5 MG tablet Take 2.5-5 mg by mouth See admin instructions. Take 0.5 tablet on Wednesday. Take 1 tablet all other days.   Yes Historical Provider, MD   BP 118/42  Pulse 37  Temp(Src) 100.8 F (38.2 C) (Rectal)  Resp 27  SpO2 100% Physical Exam  Nursing note and vitals reviewed. Constitutional: He is oriented to person, place, and time. He appears well-developed and well-nourished.  Patient is pale, warm to the touch, mildly  diaphoretic  HENT:  Head: Normocephalic and atraumatic.  Nose: Nose normal.  Mouth/Throat: Oropharynx is clear and moist.  Eyes: Conjunctivae and EOM are normal. Pupils are equal, round, and reactive to light.  Neck: Normal range of motion. Neck supple. No JVD present. No tracheal deviation present. No thyromegaly present.  Cardiovascular: Normal heart sounds and intact distal pulses.  Exam reveals no gallop and no friction rub.   No murmur heard. Tachycardia with irregular rhythm  Pulmonary/Chest: Effort normal and breath sounds normal. No stridor. No respiratory distress. He has no wheezes. He has no rales. He exhibits no tenderness.  Abdominal: Soft. Bowel sounds are normal. He exhibits no distension and no mass. There is no tenderness. There is no rebound and no guarding.  Musculoskeletal: He exhibits no edema and no tenderness.  Bilateral BKA noted  Lymphadenopathy:    He has no cervical adenopathy.  Neurological: He is alert and oriented to person, place, and time. He displays normal reflexes. He exhibits normal muscle tone. Coordination normal.  Skin: Skin is warm. No rash noted. He is diaphoretic. No erythema. No pallor.  Psychiatric: He has a normal mood and affect. His behavior is normal. Judgment and thought content normal.    ED Course  Procedures (including critical care time) Labs Review Labs Reviewed  CBC WITH DIFFERENTIAL - Abnormal; Notable for the following:    Hemoglobin 12.9 (*)    Neutrophils Relative % 92 (*)    Neutro Abs 8.0  (*)    Lymphocytes Relative 3 (*)    Lymphs Abs 0.2 (*)    All other components within normal limits  COMPREHENSIVE METABOLIC PANEL - Abnormal; Notable for the following:    Chloride 94 (*)    Glucose, Bld 214 (*)    Creatinine, Ser 5.49 (*)    AST 913 (*)    ALT 577 (*)    Alkaline Phosphatase 137 (*)    Total Bilirubin 2.0 (*)    GFR calc non Af Amer 9 (*)    GFR calc Af Amer 10 (*)    Anion gap 19 (*)    All other components within normal limits  PROTIME-INR - Abnormal; Notable for the following:    Prothrombin Time 22.1 (*)    INR 1.93 (*)    All other components within normal limits  I-STAT CHEM 8, ED - Abnormal; Notable for the following:    Sodium 136 (*)    Creatinine, Ser 5.60 (*)    Glucose, Bld 215 (*)    Calcium, Ion 0.95 (*)    All other components within normal limits  I-STAT CG4 LACTIC ACID, ED - Abnormal; Notable for the following:    Lactic Acid, Venous 4.13 (*)    All other components within normal limits  CULTURE, BLOOD (ROUTINE X 2)  CULTURE, BLOOD (ROUTINE X 2)  TROPONIN I  Randolm Idol, ED    Imaging Review Dg Chest Port 1 View  10/04/2013   CLINICAL DATA:  Near syncope.  Chills.  EXAM: PORTABLE CHEST - 1 VIEW  COMPARISON:  10/09/2012  FINDINGS: Postoperative changes in the mediastinum. Borderline heart size with normal pulmonary vascularity. Lungs are clear. No blunting of costophrenic angles. No pneumothorax. Tortuous aorta. No change since prior study.  IMPRESSION: No active disease.   Electronically Signed   By: Lucienne Capers M.D.   On: 10/04/2013 02:44   US Abdomen Limited Ruq  10/04/2013   CLINICAL DATA:  Near syncope. Fever. Dialysis patient. Elevated liver function studies.  EXAM: US ABDOMEN LIMITED - RIGHT UPPER QUADRANT  COMPARISON:  None.  FINDINGS: Gallbladder:  A few small stones are demonstrated in the dependent gallbladder. Mild gallbladder wall thickening at 5 mm. Murphy's sign is negative.  Common bile duct:  Diameter: Extrahepatic  bile ducts measure up to a maximum about 12 mm diameter. Pancreatic duct is also dilated 2.9 mm. No obstructing mass or stone is demonstrated, but portions of the bowel duct are excluded by bowel gas. Consider further evaluation with elective MRI/MRCP.  Liver:  Mild diffuse increased echotexture suggesting fatty infiltration. No focal lesions identified.  IMPRESSION: Nonspecific findings in the gallbladder with cholelithiasis and mild gallbladder wall thickening. Extrahepatic bile duct dilatation and pancreatic ductal dilatation without cause identified. Consider follow-up with elective MRI/ MRCP.   Electronically Signed   By: Lucienne Capers M.D.   On: 10/04/2013 04:36     EKG Interpretation   Date/Time:  Sunday October 04 2013 01:50:56 EDT Ventricular Rate:  116 PR Interval:  185 QRS Duration: 105 QT Interval:  393 QTC Calculation: 546 R Axis:   -85 Text Interpretation:  Sinus tachycardia Inferior infarct, age  indeterminate Probable anterior infarct, age indeterminate Prolonged QT  interval Confirmed by Jessly Lebeck  MD, Srinidhi Landers ([id number]) on 10/04/2013 5:45:52 AM     CRITICAL CARE Performed by: Kalman Drape Total critical care time: 60 min Critical care time was exclusive of separately billable procedures and treating other patients. Critical care was necessary to treat or prevent imminent or life-threatening deterioration. Critical care was time spent personally by me on the following activities: development of treatment plan with patient and/or surrogate as well as nursing, discussions with consultants, evaluation of patient's response to treatment, examination of patient, obtaining history from patient or surrogate, ordering and performing treatments and interventions, ordering and review of laboratory studies, ordering and review of radiographic studies, pulse oximetry and re-evaluation of patient's condition.  MDM   Final diagnoses:  Transient alteration of awareness  Fever, unspecified  fever cause  Elevated transaminase level  ESRD (end stage renal disease) on dialysis  Sepsis, due to unspecified organism    80 year old male with multiple medical problems who presents with unresponsive episode.  Patient has low-grade temp rectally, has had nausea and vomiting with some diarrhea.  Patient has elevated lactate in the criteria for sepsis.  He has new elevation in his LFTs without pain in his right upper quadrant.  Ultrasound shows dilated extra hepatic ducts, and mild gallbladder wall thickening.  Patient is not a very good surgical candidate.  No signs of acute cholecystitis.  Empiric antibiotics started given that patient is a dialysis 10 D.  Will discuss with hospitalist for admission.   Kalman Drape, MD 10/04/13 [phone]

## 2013-10-04 NOTE — ED Notes (Signed)
Wife to bedside, states patient went "unresponsive" at around 11pm. States that he got sweaty and stared at the ceiling and it lasted until EMS got there

## 2013-10-04 NOTE — ED Notes (Signed)
Dr Otter at bedside  

## 2013-10-04 NOTE — ED Notes (Signed)
EMS reports that wife states that patient came home after dialysis and took a nap, and that when he woke up tonight he was syncopal . Pt alert and oriented on arrival to ED.  EMS reports that pt was pale and diaphoretic on their arrival to house

## 2013-10-04 NOTE — ED Notes (Signed)
MD at bedside. 

## 2013-10-04 NOTE — ED Notes (Signed)
Returned from [place]. Remains alert and oriented.  Denies any pain.

## 2013-10-04 NOTE — Progress Notes (Signed)
Patient admitted after midnight. No further n/v/d. Blood pressure ok. Saline lock. Start clears continue SDU. Hold abx for now.  Doree Barthel, MD [facility]

## 2013-10-04 NOTE — ED Notes (Signed)
Wet diaper removed. Bed linens changed.  Wife at bedside

## 2013-10-04 NOTE — ED Notes (Signed)
Dr Sharol Given given a copy of lactic acid results 4.13

## 2013-10-04 NOTE — ED Notes (Signed)
Pt to US via stretcher

## 2013-10-04 NOTE — ED Notes (Signed)
Wife remains at bedside.  Pt sleeping, denies pain.  Pt reports that he does not make urine, but diaper smelled strongly of urine when removed

## 2013-10-05 ENCOUNTER — Inpatient Hospital Stay (HOSPITAL_COMMUNITY): Payer: MEDICARE

## 2013-10-05 DIAGNOSIS — R740 Nonspecific elevation of levels of transaminase and lactic acid dehydrogenase [LDH]: Secondary | ICD-10-CM

## 2013-10-05 DIAGNOSIS — K5289 Other specified noninfective gastroenteritis and colitis: Secondary | ICD-10-CM

## 2013-10-05 DIAGNOSIS — A419 Sepsis, unspecified organism: Secondary | ICD-10-CM

## 2013-10-05 DIAGNOSIS — A412 Sepsis due to unspecified staphylococcus: Secondary | ICD-10-CM | POA: Diagnosis not present

## 2013-10-05 DIAGNOSIS — Z992 Dependence on renal dialysis: Secondary | ICD-10-CM

## 2013-10-05 DIAGNOSIS — E1129 Type 2 diabetes mellitus with other diabetic kidney complication: Secondary | ICD-10-CM | POA: Diagnosis present

## 2013-10-05 DIAGNOSIS — N186 End stage renal disease: Secondary | ICD-10-CM

## 2013-10-05 DIAGNOSIS — N189 Chronic kidney disease, unspecified: Secondary | ICD-10-CM

## 2013-10-05 DIAGNOSIS — R7401 Elevation of levels of liver transaminase levels: Secondary | ICD-10-CM

## 2013-10-05 LAB — COMPREHENSIVE METABOLIC PANEL
ALK PHOS: 108 U/L (ref 39–117)
ALT: 285 U/L — ABNORMAL HIGH (ref 0–53)
ANION GAP: 18 — AB (ref 5–15)
AST: 262 U/L — ABNORMAL HIGH (ref 0–37)
Albumin: 3.2 g/dL — ABNORMAL LOW (ref 3.5–5.2)
BILIRUBIN TOTAL: 1.8 mg/dL — AB (ref 0.3–1.2)
BUN: 31 mg/dL — AB (ref 6–23)
CHLORIDE: 91 meq/L — AB (ref 96–112)
CO2: 24 meq/L (ref 19–32)
Calcium: 9.3 mg/dL (ref 8.4–10.5)
Creatinine, Ser: 7.66 mg/dL — ABNORMAL HIGH (ref 0.50–1.35)
GFR calc non Af Amer: 6 mL/min — ABNORMAL LOW (ref >90)
GFR, EST AFRICAN AMERICAN: 7 mL/min — AB (ref >90)
GLUCOSE: 103 mg/dL — AB (ref 70–99)
Potassium: 4.2 mEq/L (ref 3.7–5.3)
SODIUM: 133 meq/L — AB (ref 137–147)
TOTAL PROTEIN: 7.3 g/dL (ref 6.0–8.3)

## 2013-10-05 LAB — GI PATHOGEN PANEL BY PCR, STOOL
C DIFFICILE TOXIN A/B: NEGATIVE
Campylobacter by PCR: NEGATIVE
Cryptosporidium by PCR: NEGATIVE
E COLI (STEC): NEGATIVE
E coli (ETEC) LT/ST: NEGATIVE
E coli 0157 by PCR: NEGATIVE
G LAMBLIA BY PCR: NEGATIVE
NOROVIRUS G1/G2: NEGATIVE
Rotavirus A by PCR: NEGATIVE
SALMONELLA BY PCR: NEGATIVE
SHIGELLA BY PCR: NEGATIVE

## 2013-10-05 LAB — CBC
HCT: 38.5 % — ABNORMAL LOW (ref 39.0–52.0)
Hemoglobin: 12.4 g/dL — ABNORMAL LOW (ref 13.0–17.0)
MCH: 29.5 pg (ref 26.0–34.0)
MCHC: 32.2 g/dL (ref 30.0–36.0)
MCV: 91.4 fL (ref 78.0–100.0)
Platelets: 208 10*3/uL (ref 150–400)
RBC: 4.21 MIL/uL — ABNORMAL LOW (ref 4.22–5.81)
RDW: 13.4 % (ref 11.5–15.5)
WBC: 8.6 10*3/uL (ref 4.0–10.5)

## 2013-10-05 LAB — GLUCOSE, CAPILLARY
GLUCOSE-CAPILLARY: 198 mg/dL — AB (ref 70–99)
Glucose-Capillary: 97 mg/dL (ref 70–99)
Glucose-Capillary: 163 mg/dL — ABNORMAL HIGH (ref 70–99)
Glucose-Capillary: 151 mg/dL — ABNORMAL HIGH (ref 70–99)

## 2013-10-05 MED ORDER — PENTAFLUOROPROP-TETRAFLUOROETH EX AERO
INHALATION_SPRAY | CUTANEOUS | Status: AC
  Filled 2013-10-05: qty 103.5

## 2013-10-05 MED ORDER — SODIUM CHLORIDE 0.9 % IV SOLN
2000.0000 mg | INTRAVENOUS | Status: AC
Start: 2013-10-05 — End: 2013-10-05
  Administered 2013-10-05: 2000 mg via INTRAVENOUS
  Filled 2013-10-05: qty 2000

## 2013-10-05 MED ORDER — WARFARIN SODIUM 7.5 MG PO TABS
7.5000 mg | ORAL_TABLET | Freq: Once | ORAL | Status: AC
  Administered 2013-10-05: 7.5 mg via ORAL
  Filled 2013-10-05: qty 1

## 2013-10-05 MED ORDER — DOXERCALCIFEROL 4 MCG/2ML IV SOLN
3.0000 ug | INTRAVENOUS | Status: DC
  Administered 2013-10-06 – 2013-10-08 (×2): 3 ug via INTRAVENOUS
  Filled 2013-10-05 (×2): qty 2

## 2013-10-05 MED ORDER — VANCOMYCIN HCL IN DEXTROSE 1-5 GM/200ML-% IV SOLN
1000.0000 mg | INTRAVENOUS | Status: DC
  Administered 2013-10-06 – 2013-10-08 (×2): 1000 mg via INTRAVENOUS
  Filled 2013-10-05 (×2): qty 200

## 2013-10-05 NOTE — Progress Notes (Signed)
ANTIBIOTIC CONSULT NOTE - INITIAL  Pharmacy Consult for Vancomycin Indication: GPC 1/2 blood cultures  Allergies  Allergen Reactions  . Ancef [Cefazolin] Rash    Patient cant recall if allergy to this med exists.     Patient Measurements: Height: 5' (152.4 cm) Weight: 223 lb 1.7 oz (101.2 kg) IBW/kg (Calculated) : 50 Adjusted Body Weight: n/a  Vital Signs: Temp: 97.6 F (36.4 C) (09/28 1100) Temp src: Oral (09/28 1153) BP: 159/76 mmHg (09/28 1530) Pulse Rate: 121 (09/28 1530) Intake/Output from previous day: 09/27 0701 - 09/28 0700 In: 290 [P.O.:240; I.V.:50] Out: 1 [Stool:1] Intake/Output from this shift: Total I/O In: 3 [I.V.:3] Out: 0   Labs:  Recent Labs  10/04/13 0214 10/04/13 0229 10/05/13 0314  WBC 8.7  --  8.6  HGB 12.9* 15.0 12.4*  PLT 198  --  208  CREATININE 5.49* 5.60* 7.66*   Estimated Creatinine Clearance: 7.8 ml/min (by C-G formula based on Cr of 7.66). No results found for this basename: VANCOTROUGH, VANCOPEAK, VANCORANDOM, [place], GENTPEAK, GENTRANDOM, [place], TOBRAPEAK, TOBRARND, AMIKACINPEAK, AMIKACINTROU, AMIKACIN,  in the last 72 hours   Microbiology: Recent Results (from the past 720 hour(s))  CULTURE, BLOOD (ROUTINE X 2)     Status: None   Collection Time    10/04/13  2:14 AM      Result Value Ref Range Status   Specimen Description BLOOD LEFT ARM   Final   Special Requests BOTTLES DRAWN AEROBIC ONLY 9CCS   Final   Culture  Setup Time     Final   Value: 10/04/2013 13:30     Performed at [facility]   Culture     Final   Value:        BLOOD CULTURE RECEIVED NO GROWTH TO DATE CULTURE WILL BE HELD FOR 5 DAYS BEFORE ISSUING A FINAL NEGATIVE REPORT     Note: Culture results may be compromised due to an excessive volume of blood received in culture bottles.     Performed at [facility]   Report Status PENDING   Incomplete  CULTURE, BLOOD (ROUTINE X 2)     Status: None   Collection Time    10/04/13  2:20 AM      Result Value Ref Range Status   Specimen Description BLOOD LEFT HAND   Final   Special Requests BOTTLES DRAWN AEROBIC ONLY 9CCS   Final   Culture  Setup Time     Final   Value: 10/04/2013 13:30     Performed at [facility]   Culture     Final   Value: GRAM POSITIVE COCCI IN CLUSTERS     Note: Culture results may be compromised due to an excessive volume of blood received in culture bottles. Gram Stain Report Called to,Read Back By and Verified With: [facility] @ 1522 ON [id number] BY [facility]     Performed at [facility]   Report Status 10/05/2013 FINAL   Final  MRSA PCR SCREENING     Status: None   Collection Time    10/04/13  7:55 AM      Result Value Ref Range Status   MRSA by PCR NEGATIVE  NEGATIVE Final   Comment:            The GeneXpert MRSA Assay (FDA     approved for NASAL specimens     only), is one component of a     comprehensive MRSA colonization     surveillance program. It  is not     intended to diagnose MRSA     infection nor to guide or     monitor treatment for     MRSA infections.  CLOSTRIDIUM DIFFICILE BY PCR     Status: None   Collection Time    10/04/13  2:42 PM      Result Value Ref Range Status   C difficile by pcr NEGATIVE  NEGATIVE Final    Medical History: Past Medical History  Diagnosis Date  . Diabetes mellitus   . Gout   . Hypertension   . Hyperlipidemia   . Arthritis   . Prostate cancer     a. s/p seed implant  . Cataract     a. s/p bilat cataract surgeries.  . Diabetic neuropathy   . Atrial fibrillation     a. chronic/rate controlled;  b. on coumadin  . Moderate aortic stenosis     a. 05/2012 Echo: EF 55-60%, mod AS, mildly dil LA - prev not felt to be a surgical candidate  . Anemia of chronic disease   . Coronary artery disease     a. remote CABG x 5 in 1996; b. 04/2003 Cath: LAD 100p, LCX min irregs, RCA 26m, VG->D1 nl, VG->AM->PDA->RPL nl, LIMA->LAD nl, EF 60%.  . Edema   . PAD (peripheral artery disease)     a.  s/p bilat BKA's (L - 07/2011, R - 02/2012).  . Diabetic retinopathy   . Chronic anticoagulation     a. on coumadin  . End stage renal disease     a. Started dialysis October 2012  . Nephrolithiasis     a. prior history of percutaneous nephrostomy  . Orthostatic hypotension     a. on midodrine    Medications:  Received 1g of vancomycin yesterday (9/27) at 0500. Currently receiving HD on 3S.  Assessment: 80 yo male with ESRD (TTS) and 1/2 blood cultures with GPC in clusters.  Pharmacy asked to begin empiric vancomycin. Planning 3 hrs of HD today (extra session), so far tolerating well.  Goal of Therapy:  Pre-HD vancomycin trough 15-25  Plan:  1. Vancomycin 2000 mg IV x 1 (to complete loading dose and then supplement post-HD). 2. Vancomycin 1g IV after each HD (TTS). 3. F/u cultures and LOT for vancomycin. 4. F/u HD schedule and adjust doses as needed.  Uvaldo Rising, BCPS  Clinical Pharmacist Pager [phone]  10/05/2013 3:52 PM

## 2013-10-05 NOTE — Progress Notes (Signed)
[facility] PROGRESS NOTE  Alan [name] DDU:[id number] DOB: 1931-08-27 DOA: 10/04/2013 PCP: Limmie Patricia, MD  Assessment/Plan:  Principal Problem:   Gastroenteritis: c diff neg. GI pathogen panel negative.  No further NVD. Active Problems:   Sepsis: blood pressure better. Received IVF on admission. Now tachypneic and mild respiratory distress. Will get stat PCXR r/o pulmonary edema.  Usually dialyses TTS, but if pulmonary edema, may need HD today. Have consulted nephrology. Continue SDU pending CXR results   Aortic stenosis   CAD (coronary artery disease)   End stage renal disease on dialysis   Chronic anticoagulation   Atrial fibrillation   Hx of BKA, bilateral. C/o weakness. Will get PT eval for strengthening, OOB   Acute encephalopathy resolved   Elevated transaminase level improving. Likely shock liver. Hepatitis panel negative DM 2 with renal complications: cont SSI   Code Status:  full Family Communication:  Wife 9/27 Disposition Plan:    Consultants:  nephrology  Procedures:     Antibiotics:    HPI/Subjective: No N/V/D. No abd pain. Feels weak. Vague about dyspnea  Objective: Filed Vitals:   10/05/13 0800  BP:   Pulse:   Temp: 99 F (37.2 C)  Resp:     Intake/Output Summary (Last 24 hours) at 10/05/13 0840 Last data filed at 10/04/13 1200  Gross per 24 hour  Intake      0 ml  Output      1 ml  Net     -1 ml   Filed Weights   10/04/13 0750 10/05/13 0452  Weight: 99.3 kg (218 lb 14.7 oz) 100.8 kg (222 lb 3.6 oz)    Exam:   General:  Alert. Tachypneic. Truncated sentences. Disoriented to time  Cardiovascular: RR, fast  Respiratory: diminished throughout  Abdomen: S, NT, ND  Ext: BKAs   Basic Metabolic Panel:  Recent Labs Lab 10/04/13 0214 10/04/13 0229 10/05/13 0314  NA 138 136* 133*  K 3.8 3.7 4.2  CL 94* 98 91*  CO2 25  --  24  GLUCOSE 214* 215* 103*  BUN 19 20 31*  CREATININE 5.49* 5.60* 7.66*   CALCIUM 9.0  --  9.3   Liver Function Tests:  Recent Labs Lab 10/04/13 0214 10/05/13 0314  AST 913* 262*  ALT 577* 285*  ALKPHOS 137* 108  BILITOT 2.0* 1.8*  PROT 7.7 7.3  ALBUMIN 3.7 3.2*   No results found for this basename: LIPASE, AMYLASE,  in the last 168 hours No results found for this basename: AMMONIA,  in the last 168 hours CBC:  Recent Labs Lab 10/04/13 0214 10/04/13 0229 10/05/13 0314  WBC 8.7  --  8.6  NEUTROABS 8.0*  --   --   HGB 12.9* 15.0 12.4*  HCT 41.0 44.0 38.5*  MCV 94.0  --  91.4  PLT 198  --  208   Cardiac Enzymes:  Recent Labs Lab 10/04/13 0214  TROPONINI <0.30   BNP (last 3 results) No results found for this basename: PROBNP,  in the last 8760 hours CBG:  Recent Labs Lab 10/04/13 0809 10/04/13 1253 10/04/13 1718 10/04/13 2148 10/05/13 0808  GLUCAP 196* 168* 171* 104* 97    Recent Results (from the past 240 hour(s))  CULTURE, BLOOD (ROUTINE X 2)     Status: None   Collection Time    10/04/13  2:14 AM      Result Value Ref Range Status   Specimen Description BLOOD LEFT ARM   Final   Special Requests  BOTTLES DRAWN AEROBIC ONLY 9CCS   Final   Culture  Setup Time     Final   Value: 10/04/2013 13:30     Performed at [facility]   Culture     Final   Value:        BLOOD CULTURE RECEIVED NO GROWTH TO DATE CULTURE WILL BE HELD FOR 5 DAYS BEFORE ISSUING A FINAL NEGATIVE REPORT     Note: Culture results may be compromised due to an excessive volume of blood received in culture bottles.     Performed at [facility]   Report Status PENDING   Incomplete  CULTURE, BLOOD (ROUTINE X 2)     Status: None   Collection Time    10/04/13  2:20 AM      Result Value Ref Range Status   Specimen Description BLOOD LEFT HAND   Final   Special Requests BOTTLES DRAWN AEROBIC ONLY 9CCS   Final   Culture  Setup Time     Final   Value: 10/04/2013 13:30     Performed at [facility]   Culture     Final   Value:        BLOOD  CULTURE RECEIVED NO GROWTH TO DATE CULTURE WILL BE HELD FOR 5 DAYS BEFORE ISSUING A FINAL NEGATIVE REPORT     Note: Culture results may be compromised due to an excessive volume of blood received in culture bottles.     Performed at [facility]   Report Status PENDING   Incomplete  MRSA PCR SCREENING     Status: None   Collection Time    10/04/13  7:55 AM      Result Value Ref Range Status   MRSA by PCR NEGATIVE  NEGATIVE Final   Comment:            The GeneXpert MRSA Assay (FDA     approved for NASAL specimens     only), is one component of a     comprehensive MRSA colonization     surveillance program. It is not     intended to diagnose MRSA     infection nor to guide or     monitor treatment for     MRSA infections.  CLOSTRIDIUM DIFFICILE BY PCR     Status: None   Collection Time    10/04/13  2:42 PM      Result Value Ref Range Status   C difficile by pcr NEGATIVE  NEGATIVE Final     Studies: Dg Chest Port 1 View  10/04/2013   CLINICAL DATA:  Near syncope.  Chills.  EXAM: PORTABLE CHEST - 1 VIEW  COMPARISON:  10/09/2012  FINDINGS: Postoperative changes in the mediastinum. Borderline heart size with normal pulmonary vascularity. Lungs are clear. No blunting of costophrenic angles. No pneumothorax. Tortuous aorta. No change since prior study.  IMPRESSION: No active disease.   Electronically Signed   By: Lucienne Capers M.D.   On: 10/04/2013 02:44   US Abdomen Limited Ruq  10/04/2013   CLINICAL DATA:  Near syncope. Fever. Dialysis patient. Elevated liver function studies.  EXAM: US ABDOMEN LIMITED - RIGHT UPPER QUADRANT  COMPARISON:  None.  FINDINGS: Gallbladder:  A few small stones are demonstrated in the dependent gallbladder. Mild gallbladder wall thickening at 5 mm. Murphy's sign is negative.  Common bile duct:  Diameter: Extrahepatic bile ducts measure up to a maximum about 12 mm diameter. Pancreatic duct is also dilated 2.9 mm.  No obstructing mass or stone is  demonstrated, but portions of the bowel duct are excluded by bowel gas. Consider further evaluation with elective MRI/MRCP.  Liver:  Mild diffuse increased echotexture suggesting fatty infiltration. No focal lesions identified.  IMPRESSION: Nonspecific findings in the gallbladder with cholelithiasis and mild gallbladder wall thickening. Extrahepatic bile duct dilatation and pancreatic ductal dilatation without cause identified. Consider follow-up with elective MRI/ MRCP.   Electronically Signed   By: Lucienne Capers M.D.   On: 10/04/2013 04:36    Scheduled Meds: . aspirin EC  81 mg Oral Daily  . calcium carbonate  1 tablet Oral TID WC  . gabapentin  100 mg Oral QHS  . Influenza vac split quadrivalent PF  0.5 mL Intramuscular Tomorrow-1000  . insulin aspart  0-9 Units Subcutaneous TID WC  . midodrine  10 mg Oral TID WC  . pneumococcal 13-valent conjugate vaccine  0.5 mL Intramuscular Tomorrow-1000  . sodium chloride  3 mL Intravenous Q12H  . sodium chloride  3 mL Intravenous Q12H  . Warfarin - Pharmacist Dosing Inpatient   Does not apply q1800   Continuous Infusions:   Time spent: 35 minutes  [facility] Pager [phone]. If 7PM-7AM, please contact night-coverage at [email or web address], password [facility] 10/05/2013, 8:40 AM  LOS: 1 day

## 2013-10-05 NOTE — Consult Note (Addendum)
Reason for Consult: Continuity of ESRD care-shortness of breath in a dialysis patient Referring Physician: Doree Barthel M.D. [facility])  HPI:  80 year old African American man with past medical history significant for end-stage renal disease on hemodialysis (Tuesday, Thursday and Saturday schedule at [facility]) who was admitted early yesterday for altered mental status together with nausea, vomiting, diarrhea and mild abdominal pain. His wife also reported worsening of his chronic shortness of breath.  He completed his entire dialysis treatment on Saturday although reports that he had some intradialytic blood pressure dropped. He denies any fevers or chills and does not have cough or sputum production together with shortness of breath. He denies any chest pain and currently is free of nausea, vomiting or diarrhea. His wife was sitting at the bedside reports that his mental status is back to his baseline. A chest x-ray done earlier this morning does not show significant pulmonary edema but the patient himself reports continued worsening of shortness of breath.  Dialysis prescription: Tuesday, Thursday and Saturday. 4 hours 15 minutes, blood flow 500, dialysate 1.5 autoflow, 2K/2 calcium, UF profile 4, linear sodium modeling, Hectorol 3 mcg IV 3 times a week, heparin 3400 units bolus. Dry weight 101 kg  Past Medical History  Diagnosis Date  . Diabetes mellitus   . Gout   . Hypertension   . Hyperlipidemia   . Arthritis   . Prostate cancer     a. s/p seed implant  . Cataract     a. s/p bilat cataract surgeries.  . Diabetic neuropathy   . Atrial fibrillation     a. chronic/rate controlled;  b. on coumadin  . Moderate aortic stenosis     a. 05/2012 Echo: EF 55-60%, mod AS, mildly dil LA - prev not felt to be a surgical candidate  . Anemia of chronic disease   . Coronary artery disease     a. remote CABG x 5 in 1996; b. 04/2003 Cath: LAD 100p, LCX min irregs, RCA 36m,  VG->D1 nl, VG->AM->PDA->RPL nl, LIMA->LAD nl, EF 60%.  . Edema   . PAD (peripheral artery disease)     a. s/p bilat BKA's (L - 07/2011, R - 02/2012).  . Diabetic retinopathy   . Chronic anticoagulation     a. on coumadin  . End stage renal disease     a. Started dialysis October 2012  . Nephrolithiasis     a. prior history of percutaneous nephrostomy  . Orthostatic hypotension     a. on midodrine    Past Surgical History  Procedure Laterality Date  . Ventral hernia repair    . Inguinal hernia repair      right side  . Colonoscopy    . Kidney stone surgery    . Polypectomy    . Tonsillectomy    . Toe surgery      removal little toe right foot  . Cardiac catheterization  04/19/2003    SEVERE 2 VESSEL OBSTRUCTIVE ATHERSCLEROTIC CAD  . Coronary artery bypass graft  1996    LIMA GRAFT TO THE LAD, SEQUENTIAL SAPHENOUS  VEIN GRAFT TO THE FIRST AND SECOND DIAGONIAL BRANCHES, SEQUENTIAL SAPHENOUS VEIN GRAFT TO THE ACUTE MARGINAL, POSTERIOR DESCENDING, AND POSTERIOR LATERAL BRANCHES OF THE RIGHT CORONARY ARTERY  . Radioactive seed implant    . US echocardiography  09/06/2009    EF 55-60%  . Cardiovascular stress test  04/13/2003    EF 54%. EVIDENCE OF ANTERO-APICAL ISCHEMIA. NORMAL LV SIZE AND FUNCTION  . Darden Dates  without cardioversion  05/09/2011    Procedure: TRANSESOPHAGEAL ECHOCARDIOGRAM (TEE);  Surgeon: Larey Dresser, MD;  Location: [place];  Service: Cardiovascular;  Laterality: N/A;  . Hernia repair    . Amputation  07/20/2011    Procedure: AMPUTATION BELOW KNEE;  Surgeon: Serafina Mitchell, MD;  Location: [place];  Service: Vascular;  Laterality: Left;  . Amputation  01/25/2012    Procedure: AMPUTATION DIGIT;  Surgeon: Angelia Mould, MD;  Location: [place];  Service: Vascular;  Laterality: Right;  second  . Av fistula placement      right  . Transmetatarsal amputation Right 02/15/2012    Procedure: TRANSMETATARSAL AMPUTATION;  Surgeon: Serafina Mitchell, MD;  Location: [facility] OR;   Service: Vascular;  Laterality: Right;  . Amputation Right 02/29/2012    Procedure: AMPUTATION BELOW KNEE;  Surgeon: Serafina Mitchell, MD;  Location: [facility] OR;  Service: Vascular;  Laterality: Right;  Right BKA    Family History  Problem Relation Age of Onset  . Heart disease Father   . Hypertension Brother   . Stroke Brother   . Diabetes Mother     Pt. not sure, but he thinks she did.    Social History:  reports that he quit smoking about 45 years ago. His smoking use included Cigarettes. He smoked 0.00 packs per day for 2 years. He has quit using smokeless tobacco. His smokeless tobacco use included Chew. He reports that he does not drink alcohol or use illicit drugs.  Allergies:  Allergies  Allergen Reactions  . Ancef [Cefazolin] Rash    Patient cant recall if allergy to this med exists.     Medications:  Scheduled: . aspirin EC  81 mg Oral Daily  . calcium carbonate  1 tablet Oral TID WC  . gabapentin  100 mg Oral QHS  . Influenza vac split quadrivalent PF  0.5 mL Intramuscular Tomorrow-1000  . insulin aspart  0-9 Units Subcutaneous TID WC  . midodrine  10 mg Oral TID WC  . pneumococcal 13-valent conjugate vaccine  0.5 mL Intramuscular Tomorrow-1000  . sodium chloride  3 mL Intravenous Q12H  . sodium chloride  3 mL Intravenous Q12H  . Warfarin - Pharmacist Dosing Inpatient   Does not apply q1800    Results for orders placed during the hospital encounter of 10/04/13 (from the past 48 hour(s))  CBC WITH DIFFERENTIAL     Status: Abnormal   Collection Time    10/04/13  2:14 AM      Result Value Ref Range   WBC 8.7  4.0 - 10.5 K/uL   RBC 4.36  4.22 - 5.81 MIL/uL   Hemoglobin 12.9 (*) 13.0 - 17.0 g/dL   HCT 41.0  39.0 - 52.0 %   MCV 94.0  78.0 - 100.0 fL   MCH 29.6  26.0 - 34.0 pg   MCHC 31.5  30.0 - 36.0 g/dL   RDW 13.3  11.5 - 15.5 %   Platelets 198  150 - 400 K/uL   Neutrophils Relative % 92 (*) 43 - 77 %   Neutro Abs 8.0 (*) 1.7 - 7.7 K/uL   Lymphocytes Relative 3  (*) 12 - 46 %   Lymphs Abs 0.2 (*) 0.7 - 4.0 K/uL   Monocytes Relative 5  3 - 12 %   Monocytes Absolute 0.4  0.1 - 1.0 K/uL   Eosinophils Relative 0  0 - 5 %   Eosinophils Absolute 0.0  0.0 - 0.7 K/uL  Basophils Relative 0  0 - 1 %   Basophils Absolute 0.0  0.0 - 0.1 K/uL  TROPONIN I     Status: None   Collection Time    10/04/13  2:14 AM      Result Value Ref Range   Troponin I <0.30  <0.30 ng/mL   Comment:            Due to the release kinetics of cTnI,     a negative result within the first hours     of the onset of symptoms does not rule out     myocardial infarction with certainty.     If myocardial infarction is still suspected,     repeat the test at appropriate intervals.  CULTURE, BLOOD (ROUTINE X 2)     Status: None   Collection Time    10/04/13  2:14 AM      Result Value Ref Range   Specimen Description BLOOD LEFT ARM     Special Requests BOTTLES DRAWN AEROBIC ONLY 9CCS     Culture  Setup Time       Value: 10/04/2013 13:30     Performed at [facility]   Culture       Value:        BLOOD CULTURE RECEIVED NO GROWTH TO DATE CULTURE WILL BE HELD FOR 5 DAYS BEFORE ISSUING A FINAL NEGATIVE REPORT     Note: Culture results may be compromised due to an excessive volume of blood received in culture bottles.     Performed at [facility]   Report Status PENDING    COMPREHENSIVE METABOLIC PANEL     Status: Abnormal   Collection Time    10/04/13  2:14 AM      Result Value Ref Range   Sodium 138  137 - 147 mEq/L   Potassium 3.8  3.7 - 5.3 mEq/L   Chloride 94 (*) 96 - 112 mEq/L   CO2 25  19 - 32 mEq/L   Glucose, Bld 214 (*) 70 - 99 mg/dL   BUN 19  6 - 23 mg/dL   Creatinine, Ser 5.49 (*) 0.50 - 1.35 mg/dL   Calcium 9.0  8.4 - 10.5 mg/dL   Total Protein 7.7  6.0 - 8.3 g/dL   Albumin 3.7  3.5 - 5.2 g/dL   AST 913 (*) 0 - 37 U/L   ALT 577 (*) 0 - 53 U/L   Alkaline Phosphatase 137 (*) 39 - 117 U/L   Total Bilirubin 2.0 (*) 0.3 - 1.2 mg/dL   GFR calc non  Af Amer 9 (*) >90 mL/min   GFR calc Af Amer 10 (*) >90 mL/min   Comment: (NOTE)     The eGFR has been calculated using the CKD EPI equation.     This calculation has not been validated in all clinical situations.     eGFR's persistently <90 mL/min signify possible Chronic Kidney     Disease.   Anion gap 19 (*) 5 - 15  PROTIME-INR     Status: Abnormal   Collection Time    10/04/13  2:14 AM      Result Value Ref Range   Prothrombin Time 22.1 (*) 11.6 - 15.2 seconds   INR 1.93 (*) 0.00 - 1.49  CULTURE, BLOOD (ROUTINE X 2)     Status: None   Collection Time    10/04/13  2:20 AM      Result Value Ref Range   Specimen  Description BLOOD LEFT HAND     Special Requests BOTTLES DRAWN AEROBIC ONLY 9CCS     Culture  Setup Time       Value: 10/04/2013 13:30     Performed at [facility]   Culture       Value:        BLOOD CULTURE RECEIVED NO GROWTH TO DATE CULTURE WILL BE HELD FOR 5 DAYS BEFORE ISSUING A FINAL NEGATIVE REPORT     Note: Culture results may be compromised due to an excessive volume of blood received in culture bottles.     Performed at [place], ED     Status: None   Collection Time    10/04/13  2:27 AM      Result Value Ref Range   Troponin i, poc 0.04  0.00 - 0.08 ng/mL   Comment 3            Comment: Due to the release kinetics of cTnI,     a negative result within the first hours     of the onset of symptoms does not rule out     myocardial infarction with certainty.     If myocardial infarction is still suspected,     repeat the test at appropriate intervals.  I-STAT CHEM 8, ED     Status: Abnormal   Collection Time    10/04/13  2:29 AM      Result Value Ref Range   Sodium 136 (*) 137 - 147 mEq/L   Potassium 3.7  3.7 - 5.3 mEq/L   Chloride 98  96 - 112 mEq/L   BUN 20  6 - 23 mg/dL   Creatinine, Ser 5.60 (*) 0.50 - 1.35 mg/dL   Glucose, Bld 215 (*) 70 - 99 mg/dL   Calcium, Ion 0.95 (*) 1.13 - 1.30  mmol/L   TCO2 26  0 - 100 mmol/L   Hemoglobin 15.0  13.0 - 17.0 g/dL   HCT 44.0  39.0 - 52.0 %  I-STAT CG4 LACTIC ACID, ED     Status: Abnormal   Collection Time    10/04/13  2:30 AM      Result Value Ref Range   Lactic Acid, Venous 4.13 (*) 0.5 - 2.2 mmol/L  CBG MONITORING, ED     Status: Abnormal   Collection Time    10/04/13  6:39 AM      Result Value Ref Range   Glucose-Capillary 223 (*) 70 - 99 mg/dL  MRSA PCR SCREENING     Status: None   Collection Time    10/04/13  7:55 AM      Result Value Ref Range   MRSA by PCR NEGATIVE  NEGATIVE   Comment:            The GeneXpert MRSA Assay (FDA     approved for NASAL specimens     only), is one component of a     comprehensive MRSA colonization     surveillance program. It is not     intended to diagnose MRSA     infection nor to guide or     monitor treatment for     MRSA infections.  GLUCOSE, CAPILLARY     Status: Abnormal   Collection Time    10/04/13  8:09 AM      Result Value Ref Range   Glucose-Capillary 196 (*) 70 - 99 mg/dL   Comment  1 Documented in Chart     Comment 2 Notify RN    HEPATITIS PANEL, ACUTE     Status: None   Collection Time    10/04/13  9:30 AM      Result Value Ref Range   Hepatitis B Surface Ag NEGATIVE  NEGATIVE   HCV Ab NEGATIVE  NEGATIVE   Hep A IgM NON REACTIVE  NON REACTIVE   Comment: (NOTE)     Effective November 23, 2013, Hepatitis Acute Panel (test code [id number])     will be revised to automatically reflex to the Hepatitis C Viral RNA,     Quantitative, Real-Time PCR assay if the Hepatitis C antibody     screening result is Reactive. This action is being taken to ensure     that the CDC/USPSTF recommended HCV diagnostic algorithm with the     appropriate test reflex needed for accurate interpretation is     followed.   Hep B C IgM NON REACTIVE  NON REACTIVE   Comment: (NOTE)     High levels of Hepatitis B Core IgM antibody are detectable     during the acute stage of Hepatitis B. This  antibody is used     to differentiate current from past HBV infection.     Performed at [facility], DIRECT     Status: Abnormal   Collection Time    10/04/13  9:30 AM      Result Value Ref Range   Bilirubin, Direct 0.6 (*) 0.0 - 0.3 mg/dL  LACTIC ACID, PLASMA     Status: Abnormal   Collection Time    10/04/13  9:30 AM      Result Value Ref Range   Lactic Acid, Venous 2.9 (*) 0.5 - 2.2 mmol/L  GLUCOSE, CAPILLARY     Status: Abnormal   Collection Time    10/04/13 12:53 PM      Result Value Ref Range   Glucose-Capillary 168 (*) 70 - 99 mg/dL   Comment 1 Documented in Chart     Comment 2 Notify RN    CLOSTRIDIUM DIFFICILE BY PCR     Status: None   Collection Time    10/04/13  2:42 PM      Result Value Ref Range   C difficile by pcr NEGATIVE  NEGATIVE  GLUCOSE, CAPILLARY     Status: Abnormal   Collection Time    10/04/13  5:18 PM      Result Value Ref Range   Glucose-Capillary 171 (*) 70 - 99 mg/dL   Comment 1 Documented in Chart     Comment 2 Notify RN    GLUCOSE, CAPILLARY     Status: Abnormal   Collection Time    10/04/13  9:48 PM      Result Value Ref Range   Glucose-Capillary 104 (*) 70 - 99 mg/dL  COMPREHENSIVE METABOLIC PANEL     Status: Abnormal   Collection Time    10/05/13  3:14 AM      Result Value Ref Range   Sodium 133 (*) 137 - 147 mEq/L   Potassium 4.2  3.7 - 5.3 mEq/L   Chloride 91 (*) 96 - 112 mEq/L   CO2 24  19 - 32 mEq/L   Glucose, Bld 103 (*) 70 - 99 mg/dL   BUN 31 (*) 6 - 23 mg/dL   Comment: DELTA CHECK NOTED   Creatinine, Ser 7.66 (*) 0.50 - 1.35 mg/dL   Calcium 9.3  8.4 - 10.5 mg/dL   Total Protein 7.3  6.0 - 8.3 g/dL   Albumin 3.2 (*) 3.5 - 5.2 g/dL   AST 262 (*) 0 - 37 U/L   ALT 285 (*) 0 - 53 U/L   Alkaline Phosphatase 108  39 - 117 U/L   Total Bilirubin 1.8 (*) 0.3 - 1.2 mg/dL   GFR calc non Af Amer 6 (*) >90 mL/min   GFR calc Af Amer 7 (*) >90 mL/min   Comment: (NOTE)     The eGFR has been calculated using the CKD  EPI equation.     This calculation has not been validated in all clinical situations.     eGFR's persistently <90 mL/min signify possible Chronic Kidney     Disease.   Anion gap 18 (*) 5 - 15  CBC     Status: Abnormal   Collection Time    10/05/13  3:14 AM      Result Value Ref Range   WBC 8.6  4.0 - 10.5 K/uL   RBC 4.21 (*) 4.22 - 5.81 MIL/uL   Hemoglobin 12.4 (*) 13.0 - 17.0 g/dL   Comment: DELTA CHECK NOTED   HCT 38.5 (*) 39.0 - 52.0 %   MCV 91.4  78.0 - 100.0 fL   MCH 29.5  26.0 - 34.0 pg   MCHC 32.2  30.0 - 36.0 g/dL   RDW 13.4  11.5 - 15.5 %   Platelets 208  150 - 400 K/uL  GLUCOSE, CAPILLARY     Status: None   Collection Time    10/05/13  8:08 AM      Result Value Ref Range   Glucose-Capillary 97  70 - 99 mg/dL  GLUCOSE, CAPILLARY     Status: Abnormal   Collection Time    10/05/13 10:56 AM      Result Value Ref Range   Glucose-Capillary 163 (*) 70 - 99 mg/dL    Dg Chest Port 1 View  10/05/2013   CLINICAL DATA:  End-stage renal disease.  Kidney out.  EXAM: PORTABLE CHEST - 1 VIEW  COMPARISON:  10/04/2013.  FINDINGS: 0855 hrs. No pulmonary edema. Stable haziness at the left base may reflect chronic atelectasis or scarring. No focal airspace consolidation. No pneumothorax. The cardio pericardial silhouette is enlarged. Telemetry leads overlie the chest.  IMPRESSION: Stable exam.  Cardiomegaly without acute cardiopulmonary findings.   Electronically Signed   By: Misty Stanley M.D.   On: 10/05/2013 09:08   Dg Chest Port 1 View  10/04/2013   CLINICAL DATA:  Near syncope.  Chills.  EXAM: PORTABLE CHEST - 1 VIEW  COMPARISON:  10/09/2012  FINDINGS: Postoperative changes in the mediastinum. Borderline heart size with normal pulmonary vascularity. Lungs are clear. No blunting of costophrenic angles. No pneumothorax. Tortuous aorta. No change since prior study.  IMPRESSION: No active disease.   Electronically Signed   By: Lucienne Capers M.D.   On: 10/04/2013 02:44   US Abdomen  Limited Ruq  10/04/2013   CLINICAL DATA:  Near syncope. Fever. Dialysis patient. Elevated liver function studies.  EXAM: US ABDOMEN LIMITED - RIGHT UPPER QUADRANT  COMPARISON:  None.  FINDINGS: Gallbladder:  A few small stones are demonstrated in the dependent gallbladder. Mild gallbladder wall thickening at 5 mm. Murphy's sign is negative.  Common bile duct:  Diameter: Extrahepatic bile ducts measure up to a maximum about 12 mm diameter. Pancreatic duct is also dilated 2.9 mm. No obstructing mass or stone is demonstrated, but  portions of the bowel duct are excluded by bowel gas. Consider further evaluation with elective MRI/MRCP.  Liver:  Mild diffuse increased echotexture suggesting fatty infiltration. No focal lesions identified.  IMPRESSION: Nonspecific findings in the gallbladder with cholelithiasis and mild gallbladder wall thickening. Extrahepatic bile duct dilatation and pancreatic ductal dilatation without cause identified. Consider follow-up with elective MRI/ MRCP.   Electronically Signed   By: Lucienne Capers M.D.   On: 10/04/2013 04:36    Review of Systems  Constitutional: Positive for malaise/fatigue. Negative for fever, chills and diaphoresis.  HENT: Negative for congestion, ear pain, hearing loss, nosebleeds and sore throat.   Eyes: Negative for blurred vision, double vision and photophobia.  Respiratory: Positive for shortness of breath. Negative for cough, hemoptysis, sputum production and wheezing.   Cardiovascular: Positive for palpitations. Negative for chest pain, orthopnea and claudication.  Gastrointestinal: Positive for nausea, vomiting, abdominal pain and diarrhea. Negative for heartburn, blood in stool and melena.  Genitourinary: Negative for dysuria, urgency and frequency.  Musculoskeletal: Positive for back pain. Negative for myalgias and neck pain.  Skin: Negative.   Neurological: Positive for weakness. Negative for tingling, tremors, sensory change and headaches.        Altered mentation prior to arrival to the hospital  Psychiatric/Behavioral: Negative for depression. The patient is not nervous/anxious.    Blood pressure 111/72, pulse 120, temperature 97.6 F (36.4 C), temperature source Axillary, resp. rate 27, height 5' (1.524 m), weight 100.8 kg (222 lb 3.6 oz), SpO2 95.00%. Physical Exam  Nursing note and vitals reviewed. Constitutional: He is oriented to person, place, and time. He appears well-developed and well-nourished. No distress.  Unable to speak complete sentences without being short of breath  HENT:  Head: Normocephalic and atraumatic.  Nose: Nose normal.  Mouth/Throat: No oropharyngeal exudate.  Eyes: EOM are normal. Pupils are equal, round, and reactive to light. No scleral icterus.  Neck: Normal range of motion. Neck supple. JVD present. No thyromegaly present.  8 cm JVP  Cardiovascular: Regular rhythm.  Exam reveals no friction rub.   Murmur heard. Regular tachycardia with an ejection systolic murmur over apex  Respiratory: Effort normal. He has no wheezes. He has rales.  Fine rales bibasally  GI: Soft. Bowel sounds are normal. He exhibits no distension. There is no tenderness. There is no rebound.  Musculoskeletal:  Status post bilateral below-knee amputation  Lymphadenopathy:    He has no cervical adenopathy.  Neurological: He is alert and oriented to person, place, and time. No cranial nerve deficit.  Skin: Skin is warm and dry. No rash noted. No erythema.  Psychiatric: He has a normal mood and affect.    Assessment/Plan: 1. Dyspnea: Possibly from volume excess in this patient with end-stage renal disease, we'll plan for additional dialysis treatment today and his scheduled treatment tomorrow in order to try and challenge is dry weight (101 kg). If chest x-ray is unrevealing, he may benefit from a contrasted CT of his chest to evaluate for possible PE and other sources of chronic dyspnea. 2. Acute Gastroenteritis: Clostridium  difficile testing negative, appears to be self-limited and currently resolved. Markers of sepsis significantly better with improved blood pressure although he remains tachycardic. 3. End-stage renal disease: Usually on a Tuesday, Thursday and Saturday dialysis schedule-we'll add on additional treatment today for ultrafiltration 4. History of coronary artery disease and inoperable aortic stenosis: Continue to monitor closely and volume management as permitted by dialysis. 5. Metabolic bone disease: Resume binders doses and will restart vitamin  D receptor analogs. 6. Anemia of chronic kidney disease: Hemoglobin currently greater than 11, no indications for ESA  Laterrian Hevener K. 10/05/2013, 12:14 PM

## 2013-10-05 NOTE — Procedures (Signed)
Patient seen on Hemodialysis. QB 400, UF goal 2.5L Treatment adjusted as needed.  Elmarie Shiley MD [facility]. Office # [phone] Pager # [phone] 2:17 PM

## 2013-10-05 NOTE — Progress Notes (Signed)
ANTICOAGULATION CONSULT NOTE - Follow Up Consult  Pharmacy Consult for Coumadin Indication: atrial fibrillation  Allergies  Allergen Reactions  . Ancef [Cefazolin] Rash    Patient cant recall if allergy to this med exists.     Patient Measurements: Height: 5' (152.4 cm) Weight: 222 lb 3.6 oz (100.8 kg) IBW/kg (Calculated) : 50 Heparin Dosing Weight:    Vital Signs: Temp: 97.6 F (36.4 C) (09/28 1100) Temp src: Axillary (09/28 1100) BP: 111/72 mmHg (09/28 1057) Pulse Rate: 120 (09/28 1057)  Labs:  Recent Labs  10/04/13 0214 10/04/13 0229 10/05/13 0314  HGB 12.9* 15.0 12.4*  HCT 41.0 44.0 38.5*  PLT 198  --  208  LABPROT 22.1*  --   --   INR 1.93*  --   --   CREATININE 5.49* 5.60* 7.66*  TROPONINI <0.30  --   --     Estimated Creatinine Clearance: 7.8 ml/min (by C-G formula based on Cr of 7.66).   Assessment: N/V/D, abd pain, AMS  Anticoagulation: Warfarin PTA for AFib. IRN 1.93. INR not done today. - PTA dose: 2.5 mg qWed, 5 mg all other days  Infectious Disease: WBC 8.6, LA 4.1 > 2.9. Abx dc'd, thinking viral gastroenteritis> bacterial, Tmax 99.8. vanc x1 Zosyn x1  9/27 blood PP:IRJJOAC 9/27 cdiff: negative  Cardiovascular: dyslipidemia, AS, CAD, CABG 1996, BP 111/72, HR 40-120, asa81, midodrine  Ortho: B BKA  Endocrinology: CBG 97-223 - SSI  GI / Nutrition: gastroenteritis - likely viral  Neurology: gabapentin  Nephrology: ESRD on HD - T,Th,S (last HD 9/26). Plan EXTRA HD today 9/28 for dyspnea.  Pulmonary: RA  Hematology / Oncology: h/o prostate cancer   PTA Medication Issues: allopurinol, estazolam, amaryl, statin  Best Practices: warfarin   Goal of Therapy:  INR 2-3 Monitor platelets by anticoagulation protocol: Yes   Plan:  INR low yesterday but not drawn today. Give Coumadin 7.5mg  po x 1 tonight. Start daily INR   Alan [name] S. Alford Highland, PharmD, BCPS Clinical Staff Pharmacist Pager [phone]  Alan [name]  [name] 10/05/2013,1:32 PM

## 2013-10-05 NOTE — Progress Notes (Signed)
Physician notified: sullivan At: 1526  Regarding: 3s05 [place]. FYI blood culture from one aerobic set has gram positive cocci in clusters growing. Thanks.

## 2013-10-05 NOTE — Progress Notes (Signed)
Utilization review completed.  

## 2013-10-05 NOTE — Progress Notes (Signed)
Occupational Therapy Evaluation Patient Details Name: Alan [name] MRN: [id number] DOB: [birth year] Today's Date: 10/05/2013    History of Present Illness pt presents with Encephalopathy, N/V/D, and AMS.  pt with Hx Bil BKAs.     Clinical Impression   PTA, pt lived at home with wife and was mod I @ w/c level and wife assisted with ADL as needed. Pt/wife desire to D/C home. Asked wife to bring in pt's prosthetic legs to further assess mobility. Feel pt should be able to D/C home with HHOT/PT and HHaide. Pt will benefit from skilled OT services to facilitate D/C to next venue due to below deficits.  Eval limited this am due to HR sustained in 120s and RR @ 26. Pt scheduled for dialysis this pm.   Follow Up Recommendations  Home health OT;Supervision/Assistance - 24 hour ;[place] Aide   Equipment Recommendations  None recommended by OT (pt declines hospital bed)    Recommendations for Other Services       Precautions / Restrictions Precautions Precautions: Fall Precaution Comments: pt with Bil prosthetics.   Restrictions Weight Bearing Restrictions: No      Mobility Bed Mobility Overal bed mobility: Needs Assistance Bed Mobility: Supine to Sit     Supine to sit: Mod assist     General bed mobility comments: pt only agreeable to long sit in bed this am 2/2 fatigue.  pt utilized bed rail and needed A to bring trunk up to sitting.    Transfers                 General transfer comment: unable to assess this session    Balance Overall balance assessment: Needs assistance                                          ADL Overall ADL's : Needs assistance/impaired     Grooming: Set up;Sitting   Upper Body Bathing: Set up;Bed level   Lower Body Bathing: Moderate assistance;Bed level   Upper Body Dressing : Minimal assistance;Sitting   Lower Body Dressing: Bed level;Maximal assistance               Functional mobility during ADLs:  (not  assessed. Pt does not have prosthetic legs and HR sustan) General ADL Comments: decline inf function     Vision                     Perception     Praxis      Pertinent Vitals/Pain Pain Assessment: No/denies pain     Hand Dominance Right   Extremity/Trunk Assessment Upper Extremity Assessment Upper Extremity Assessment: Generalized weakness (B wasting of intrinsics)   Lower Extremity Assessment Lower Extremity Assessment: RLE deficits/detail;LLE deficits/detail RLE Deficits / Details: Hx BKA with generalized weakness.   LLE Deficits / Details: Hx BKA with generalized weakness.     Cervical / Trunk Assessment Cervical / Trunk Assessment: Kyphotic   Communication Communication Communication: No difficulties   Cognition Arousal/Alertness: Awake/alert Behavior During Therapy: Flat affect Overall Cognitive Status: History of cognitive impairments - at baseline (wife states he is close to baseline)       Memory: Decreased recall of precautions             General Comments       Exercises Exercises: Other exercises Other Exercises Other Exercises: BUE AROM   Shoulder Instructions  Home Living Family/patient expects to be discharged to:: Private residence Living Arrangements: Spouse/significant other Available Help at Discharge: Family;Available 24 hours/day Type of Home: House Home Access: Ramped entrance     Home Layout: One level     Bathroom Shower/Tub: Tub/shower unit     Bathroom Accessibility: No   Home Equipment: Bedside commode;Wheelchair - Comptroller - 2 wheels (Commode is drop-arm.)          Prior Functioning/Environment Level of Independence: Needs assistance  Gait / Transfers Assistance Needed: pt Mod I with lateral scoot transfers to/from W/C.   ADL's / Homemaking Assistance Needed: Wife performs homemaking tasks and sets up pt for ADLs.  Wife A with hard to reach bathing.          OT Diagnosis:  Generalized weakness;Acute pain;Altered mental status   OT Problem List: Decreased strength;Decreased activity tolerance;Decreased safety awareness;Decreased knowledge of use of DME or AE;Impaired sensation;Obesity;Pain   OT Treatment/Interventions: Self-care/ADL training;Therapeutic exercise;DME and/or AE instruction;Therapeutic activities;Patient/family education;Balance training    OT Goals(Current goals can be found in the care plan section) Acute Rehab OT Goals Patient Stated Goal: Home OT Goal Formulation: With patient/family Time For Goal Achievement: 10/19/13 Potential to Achieve Goals: Good  OT Frequency: Min 2X/week   Barriers to D/C:            Co-evaluation              End of Session Nurse Communication: Mobility status  Activity Tolerance: Treatment limited secondary to medical complications (Comment);Other (comment) (sustained HR 120-122; RR 26) Patient left: in bed;with call bell/phone within reach;with family/visitor present   Time: 1497-0263 OT Time Calculation (min): 16 min Charges:  OT General Charges $OT Visit: 1 Procedure OT Evaluation $Initial OT Evaluation Tier I: 1 Procedure OT Treatments $Self Care/Home Management : 8-22 mins G-Codes:    Inda Mcglothen,HILLARY 2013-11-03, 11:55 AM   Maurie Boettcher, OTR/L  [phone] 2013/11/03

## 2013-10-05 NOTE — Progress Notes (Signed)
IV team paged at [place], loss of PIV in dialysis, currently no IV access. Restricted RUE. Pt placed on list.

## 2013-10-05 NOTE — Evaluation (Signed)
Physical Therapy Evaluation Patient Details Name: Alan [name] MRN: [id number] DOB: [birth year] Today's Date: 10/05/2013   History of Present Illness  pt presents with Encephalopathy, N/V/D, and AMS.  pt with Hx Bil BKAs.    Clinical Impression  Pt indicates fatigue this am and only agreeable to sit up in bed.  Pt's HR remained in 120's during session.  Pt indicates plan for D/C home with wife, however question if pt would benefit from [facility] at D/C as he indicates his wife has not been well lately and may need A caring for pt.  Will continue to follow.      Follow Up Recommendations Home health PT;Supervision/Assistance - 24 hour    Equipment Recommendations  None recommended by PT    Recommendations for Other Services       Precautions / Restrictions Precautions Precautions: Fall Precaution Comments: pt with Bil prosthetics.   Restrictions Weight Bearing Restrictions: No      Mobility  Bed Mobility Overal bed mobility: Needs Assistance Bed Mobility: Supine to Sit     Supine to sit: Mod assist     General bed mobility comments: pt only agreeable to long sit in bed this am 2/2 fatigue.  pt utilized bed rail and needed A to bring trunk up to sitting.    Transfers                    Ambulation/Gait                Stairs            Wheelchair Mobility    Modified Rankin (Stroke Patients Only)       Balance                                             Pertinent Vitals/Pain Pain Assessment: No/denies pain    Home Living Family/patient expects to be discharged to:: Private residence Living Arrangements: Spouse/significant other Available Help at Discharge: Family;Available 24 hours/day Type of Home: House Home Access: Ramped entrance     Home Layout: One level Home Equipment: Bedside commode;Wheelchair - Education officer, community - power (Commode is drop-arm.)      Prior Function Level of Independence: Needs  assistance   Gait / Transfers Assistance Needed: pt Mod I with lateral scoot transfers to/from W/C.    ADL's / Homemaking Assistance Needed: Wife performs homemaking tasks and sets up pt for ADLs.  Wife A with hard to reach bathing.          Hand Dominance        Extremity/Trunk Assessment   Upper Extremity Assessment: Defer to OT evaluation           Lower Extremity Assessment: RLE deficits/detail;LLE deficits/detail RLE Deficits / Details: Hx BKA with generalized weakness.   LLE Deficits / Details: Hx BKA with generalized weakness.       Communication   Communication: No difficulties  Cognition Arousal/Alertness: Awake/alert Behavior During Therapy: WFL for tasks assessed/performed Overall Cognitive Status: Within Functional Limits for tasks assessed                      General Comments      Exercises        Assessment/Plan    PT Assessment Patient needs continued PT services  PT Diagnosis Generalized weakness   PT Problem List  Decreased strength;Decreased activity tolerance;Decreased balance;Decreased mobility;Decreased knowledge of use of DME  PT Treatment Interventions DME instruction;Functional mobility training;Therapeutic activities;Therapeutic exercise;Balance training;Patient/family education   PT Goals (Current goals can be found in the Care Plan section) Acute Rehab PT Goals Patient Stated Goal: Home PT Goal Formulation: With patient Time For Goal Achievement: 10/19/13 Potential to Achieve Goals: Good    Frequency Min 3X/week   Barriers to discharge Decreased caregiver support pt states hiw wife helps him, but indicates she has not been well lately.      Co-evaluation               End of Session   Activity Tolerance: Patient limited by fatigue Patient left: in bed;with call bell/phone within reach Nurse Communication: Mobility status         Time: 0802-0816 PT Time Calculation (min): 14 min   Charges:   PT  Evaluation $Initial PT Evaluation Tier I: 1 Procedure PT Treatments $Therapeutic Activity: 8-22 mins   PT G CodesCatarina Hartshorn, Rachel 10/05/2013, 8:26 AM

## 2013-10-06 DIAGNOSIS — Z7901 Long term (current) use of anticoagulants: Secondary | ICD-10-CM

## 2013-10-06 DIAGNOSIS — I4891 Unspecified atrial fibrillation: Secondary | ICD-10-CM

## 2013-10-06 LAB — PROTIME-INR
INR: 3.7 — ABNORMAL HIGH (ref 0.00–1.49)
PROTHROMBIN TIME: 36.7 s — AB (ref 11.6–15.2)

## 2013-10-06 LAB — RENAL FUNCTION PANEL
ALBUMIN: 3 g/dL — AB (ref 3.5–5.2)
Anion gap: 20 — ABNORMAL HIGH (ref 5–15)
BUN: 26 mg/dL — AB (ref 6–23)
CHLORIDE: 91 meq/L — AB (ref 96–112)
CO2: 22 mEq/L (ref 19–32)
Calcium: 9 mg/dL (ref 8.4–10.5)
Creatinine, Ser: 6.4 mg/dL — ABNORMAL HIGH (ref 0.50–1.35)
GFR calc Af Amer: 9 mL/min — ABNORMAL LOW (ref >90)
GFR calc non Af Amer: 7 mL/min — ABNORMAL LOW (ref >90)
Glucose, Bld: 83 mg/dL (ref 70–99)
Phosphorus: 3.5 mg/dL (ref 2.3–4.6)
Potassium: 3.9 mEq/L (ref 3.7–5.3)
Sodium: 133 mEq/L — ABNORMAL LOW (ref 137–147)

## 2013-10-06 LAB — CBC
HCT: 40.9 % (ref 39.0–52.0)
Hemoglobin: 13.4 g/dL (ref 13.0–17.0)
MCH: 30.5 pg (ref 26.0–34.0)
MCHC: 32.8 g/dL (ref 30.0–36.0)
MCV: 93.2 fL (ref 78.0–100.0)
PLATELETS: 194 10*3/uL (ref 150–400)
RBC: 4.39 MIL/uL (ref 4.22–5.81)
RDW: 13.5 % (ref 11.5–15.5)
WBC: 7 10*3/uL (ref 4.0–10.5)

## 2013-10-06 LAB — GLUCOSE, CAPILLARY
GLUCOSE-CAPILLARY: 151 mg/dL — AB (ref 70–99)
Glucose-Capillary: 88 mg/dL (ref 70–99)

## 2013-10-06 MED ORDER — MIDODRINE HCL 5 MG PO TABS
ORAL_TABLET | ORAL | Status: AC
  Filled 2013-10-06: qty 2

## 2013-10-06 MED ORDER — DOXERCALCIFEROL 4 MCG/2ML IV SOLN
INTRAVENOUS | Status: AC
  Filled 2013-10-06: qty 2

## 2013-10-06 NOTE — Progress Notes (Signed)
PT Cancellation Note  Patient Details Name: Alan [name] MRN: [id number] DOB: 1931/05/28   Cancelled Treatment:    Reason Eval/Treat Not Completed: Patient at procedure or test/unavailable   Duncan Dull 10/06/2013, 8:53 AM Alben Deeds, PT DPT  [phone]

## 2013-10-06 NOTE — Progress Notes (Signed)
Patient ID: Alan [name], male   DOB: [birth year], 80 y.o.   MRN: [id number]  [facility] Progress Note   Assessment/ Plan:   1. Dyspnea: Suspected to be from volume excess in this patient with status post bilateral BKA-symptoms improved with dialysis however he remains tachycardic and may benefit greatly from CT angiogram to rule out PE given chronicity of symptoms.  2. Acute Gastroenteritis: Testing so far negative, no further nausea/vomiting or diarrhea since admission.  3. End-stage renal disease: Undergoing dialysis today per his usual outpatient schedule after an additional treatment yesterday for ultrafiltration/volume management  4. History of coronary artery disease and inoperable aortic stenosis: Continue to monitor closely and volume management as permitted by dialysis.  5. Metabolic bone disease: Resume binders doses and will restart vitamin D receptor analogs.  6. Anemia of chronic kidney disease: Hemoglobin currently greater than 11, no indications for ESA   Subjective:   Reports to have had a comfortable night and breathing better at this time after his dialysis yesterday.    Objective:   BP 90/50  Pulse 125  Temp(Src) 97.8 F (36.6 C) (Oral)  Resp 19  Ht 5' (1.524 m)  Wt 99.4 kg (219 lb 2.2 oz)  BMI 42.80 kg/m2  SpO2 96%  Physical Exam: Gen: Comfortable resting on dialysis CVS: Pulse regular tachycardia, S1 and S2 with ESM Resp: Clear to auscultation bilaterally-no distinct rales or rhonchi Abd: Soft, flat, nontender Ext: Status post bilateral BKA  Labs: BMET  Recent Labs Lab 10/04/13 0214 10/04/13 0229 10/05/13 0314 10/06/13 0249  NA 138 136* 133* 133*  K 3.8 3.7 4.2 3.9  CL 94* 98 91* 91*  CO2 25  --  24 22  GLUCOSE 214* 215* 103* 83  BUN 19 20 31* 26*  CREATININE 5.49* 5.60* 7.66* 6.40*  CALCIUM 9.0  --  9.3 9.0  PHOS  --   --   --  3.5   CBC  Recent Labs Lab 10/04/13 0214 10/04/13 0229 10/05/13 0314  WBC 8.7  --  8.6   NEUTROABS 8.0*  --   --   HGB 12.9* 15.0 12.4*  HCT 41.0 44.0 38.5*  MCV 94.0  --  91.4  PLT 198  --  208   Medications:    . aspirin EC  81 mg Oral Daily  . calcium carbonate  1 tablet Oral TID WC  . doxercalciferol  3 mcg Intravenous Q T,Th,Sa-HD  . gabapentin  100 mg Oral QHS  . insulin aspart  0-9 Units Subcutaneous TID WC  . midodrine  10 mg Oral TID WC  . pneumococcal 13-valent conjugate vaccine  0.5 mL Intramuscular Tomorrow-1000  . sodium chloride  3 mL Intravenous Q12H  . sodium chloride  3 mL Intravenous Q12H  . vancomycin  1,000 mg Intravenous Q T,Th,Sa-HD  . Warfarin - Pharmacist Dosing Inpatient   Does not apply [id number]   Elmarie Shiley, MD 10/06/2013, 9:37 AM

## 2013-10-06 NOTE — Progress Notes (Signed)
ANTICOAGULATION CONSULT NOTE - Follow Up Consult  Pharmacy Consult for Coumadin Indication: atrial fibrillation  Allergies  Allergen Reactions  . Ancef [Cefazolin] Rash    Patient cant recall if allergy to this med exists.     Patient Measurements: Height: 5' (152.4 cm) Weight: 213 lb 3 oz (96.7 kg) IBW/kg (Calculated) : 50 Heparin Dosing Weight:    Vital Signs: Temp: 98.1 F (36.7 C) (09/29 1313) Temp src: Oral (09/29 1313) BP: 85/49 mmHg (09/29 1313) Pulse Rate: 127 (09/29 1313)  Labs:  Recent Labs  10/04/13 0214 10/04/13 0229 10/05/13 0314 10/06/13 0249  HGB 12.9* 15.0 12.4*  --   HCT 41.0 44.0 38.5*  --   PLT 198  --  208  --   LABPROT 22.1*  --   --  36.7*  INR 1.93*  --   --  3.70*  CREATININE 5.49* 5.60* 7.66* 6.40*  TROPONINI <0.30  --   --   --     Estimated Creatinine Clearance: 9.1 ml/min (by C-G formula based on Cr of 6.4).   Assessment: N/V/D, abd pain, AMS  Anticoagulation: Warfarin PTA for AFib. INR 1.93>>3.7. - PTA dose: 2.5 mg qWed, 5 mg all other days  Infectious Disease: WBC 8.6, LA 4.1 > 2.9.Tmax 99. 9/27:  Vanco>> Zosyn x1  9/27 blood cx:  GPC x1 9/27 cdiff: negative  Cardiovascular: dyslipidemia, AS, CAD, CABG 1996, BP 85/49,HR 127, HR 112-125, asa81, midodrine. R/O PE with tachycardia?  OrthoMarlyn Corporal  Endocrinology: CBG [phone] - SSI  GI / Nutrition: gastroenteritis  Neurology: gabapentin  Nephrology: ESRD on HD - T,Th,S (last HD 9/26). Plan EXTRA HD  9/28 for dyspnea.  Pulmonary: RA, 100%  Hematology / Oncology: h/o prostate cancer   PTA Medication Issues: allopurinol, estazolam, amaryl, statin  Best Practices: warfarin   Goal of Therapy:  INR 2-3 Monitor platelets by anticoagulation protocol: Yes   Plan:  Hold Coumadin for elevated INR   Neidy Guerrieri S. Alford Highland, PharmD, BCPS Clinical Staff Pharmacist Pager [phone]  Eilene Ghazi Stillinger 10/06/2013,1:25 PM

## 2013-10-06 NOTE — Progress Notes (Addendum)
[facility] PROGRESS NOTE  Alan [name] SEG:[id number] DOB: [birth year] DOA: 10/04/2013 PCP: Limmie Patricia, MD  Assessment/Plan:  Principal Problem:   Gastroenteritis: c diff neg. GI pathogen panel negative.  Tolerating solids. Active Problems:   Sepsis: 1/2 GPC clusters. Started on vanc pending results.   Aortic stenosis   CAD (coronary artery disease)   End stage renal disease on dialysis   Chronic anticoagulation   Paroxysmal Atrial fibrillation   Hx of BKA, bilateral. C/o weakness. Will get PT eval for strengthening, OOB   Acute encephalopathy resolved   Elevated transaminase level improving. Likely shock liver. Hepatitis panel negative DM 2 with renal complications: cont SSI Sinus tachycardia may be related infectious process. Coumadin therapeutic. CTA chest would be low yield. D/w Dr. Posey Pronto. Hold off for now  Code Status:  full Family Communication:  Wife Disposition Plan:  Transfer to tele  Consultants:  nephrology  Procedures:     Antibiotics:    HPI/Subjective: No N/V/D. No abd pain. Still vague about dyspnea  Objective: Filed Vitals:   10/06/13 1313  BP: 85/49  Pulse: 127  Temp: 98.1 F (36.7 C)  Resp: 13    Intake/Output Summary (Last 24 hours) at 10/06/13 1346 Last data filed at 10/06/13 1233  Gross per 24 hour  Intake    500 ml  Output   3781 ml  Net  -3281 ml   Filed Weights   10/06/13 0416 10/06/13 0801 10/06/13 1233  Weight: 101 kg (222 lb 10.6 oz) 99.4 kg (219 lb 2.2 oz) 96.7 kg (213 lb 3 oz)   Tele: sinus tach  Exam:   General:  Alert. More comfortable, less tachypneic. Eating lunch and watching TV  Cardiovascular: RR, fast  Respiratory: diminished throughout  Abdomen: S, NT, ND  Ext: BKAs   Basic Metabolic Panel:  Recent Labs Lab 10/04/13 0214 10/04/13 0229 10/05/13 0314 10/06/13 0249  NA 138 136* 133* 133*  K 3.8 3.7 4.2 3.9  CL 94* 98 91* 91*  CO2 25  --  24 22  GLUCOSE 214* 215* 103* 83   BUN 19 20 31* 26*  CREATININE 5.49* 5.60* 7.66* 6.40*  CALCIUM 9.0  --  9.3 9.0  PHOS  --   --   --  3.5   Liver Function Tests:  Recent Labs Lab 10/04/13 0214 10/05/13 0314 10/06/13 0249  AST 913* 262*  --   ALT 577* 285*  --   ALKPHOS 137* 108  --   BILITOT 2.0* 1.8*  --   PROT 7.7 7.3  --   ALBUMIN 3.7 3.2* 3.0*   No results found for this basename: LIPASE, AMYLASE,  in the last 168 hours No results found for this basename: AMMONIA,  in the last 168 hours CBC:  Recent Labs Lab 10/04/13 0214 10/04/13 0229 10/05/13 0314  WBC 8.7  --  8.6  NEUTROABS 8.0*  --   --   HGB 12.9* 15.0 12.4*  HCT 41.0 44.0 38.5*  MCV 94.0  --  91.4  PLT 198  --  208   Cardiac Enzymes:  Recent Labs Lab 10/04/13 0214  TROPONINI <0.30   BNP (last 3 results) No results found for this basename: PROBNP,  in the last 8760 hours CBG:  Recent Labs Lab 10/05/13 0808 10/05/13 1056 10/05/13 1902 10/05/13 2146 10/06/13 0744  GLUCAP 97 163* 198* 151* 88    Recent Results (from the past 240 hour(s))  CULTURE, BLOOD (ROUTINE X 2)     Status: None  Collection Time    10/04/13  2:14 AM      Result Value Ref Range Status   Specimen Description BLOOD LEFT ARM   Final   Special Requests BOTTLES DRAWN AEROBIC ONLY 9CCS   Final   Culture  Setup Time     Final   Value: 10/04/2013 13:30     Performed at [facility]   Culture     Final   Value:        BLOOD CULTURE RECEIVED NO GROWTH TO DATE CULTURE WILL BE HELD FOR 5 DAYS BEFORE ISSUING A FINAL NEGATIVE REPORT     Note: Culture results may be compromised due to an excessive volume of blood received in culture bottles.     Performed at [facility]   Report Status PENDING   Incomplete  CULTURE, BLOOD (ROUTINE X 2)     Status: None   Collection Time    10/04/13  2:20 AM      Result Value Ref Range Status   Specimen Description BLOOD LEFT HAND   Final   Special Requests BOTTLES DRAWN AEROBIC ONLY 9CCS   Final   Culture   Setup Time     Final   Value: 10/04/2013 13:30     Performed at [facility]   Culture     Final   Value: GRAM POSITIVE COCCI IN CLUSTERS     Note: Culture results may be compromised due to an excessive volume of blood received in culture bottles. Gram Stain Report Called to,Read Back By and Verified With: MORGAN SCHMITZ @ 1522 ON [id number] BY [facility]     Performed at [facility]   Report Status PENDING   Incomplete  MRSA PCR SCREENING     Status: None   Collection Time    10/04/13  7:55 AM      Result Value Ref Range Status   MRSA by PCR NEGATIVE  NEGATIVE Final   Comment:            The GeneXpert MRSA Assay (FDA     approved for NASAL specimens     only), is one component of a     comprehensive MRSA colonization     surveillance program. It is not     intended to diagnose MRSA     infection nor to guide or     monitor treatment for     MRSA infections.  CLOSTRIDIUM DIFFICILE BY PCR     Status: None   Collection Time    10/04/13  2:42 PM      Result Value Ref Range Status   C difficile by pcr NEGATIVE  NEGATIVE Final     Studies: Dg Chest Port 1 View  10/05/2013   CLINICAL DATA:  End-stage renal disease.  Kidney out.  EXAM: PORTABLE CHEST - 1 VIEW  COMPARISON:  10/04/2013.  FINDINGS: 0855 hrs. No pulmonary edema. Stable haziness at the left base may reflect chronic atelectasis or scarring. No focal airspace consolidation. No pneumothorax. The cardio pericardial silhouette is enlarged. Telemetry leads overlie the chest.  IMPRESSION: Stable exam.  Cardiomegaly without acute cardiopulmonary findings.   Electronically Signed   By: Misty Stanley M.D.   On: 10/05/2013 09:08    Scheduled Meds: . aspirin EC  81 mg Oral Daily  . calcium carbonate  1 tablet Oral TID WC  . doxercalciferol  3 mcg Intravenous Q T,Th,Sa-HD  . gabapentin  100 mg Oral QHS  . insulin aspart  0-9 Units Subcutaneous TID WC  . midodrine  10 mg Oral TID WC  . pneumococcal 13-valent conjugate vaccine   0.5 mL Intramuscular Tomorrow-1000  . sodium chloride  3 mL Intravenous Q12H  . sodium chloride  3 mL Intravenous Q12H  . vancomycin  1,000 mg Intravenous Q T,Th,Sa-HD  . Warfarin - Pharmacist Dosing Inpatient   Does not apply q1800   Continuous Infusions:   Time spent: 35 minutes  [facility] Pager [phone]. If 7PM-7AM, please contact night-coverage at [email or web address], password [facility] 10/06/2013, 1:46 PM  LOS: 2 days

## 2013-10-06 NOTE — Procedures (Signed)
Patient seen on Hemodialysis. QB 400, UF goal 3.5L Treatment adjusted as needed.  Elmarie Shiley MD [facility]. Office # [phone] Pager # [phone] 9:39 AM

## 2013-10-07 DIAGNOSIS — R7881 Bacteremia: Secondary | ICD-10-CM | POA: Diagnosis present

## 2013-10-07 DIAGNOSIS — R000 Tachycardia, unspecified: Secondary | ICD-10-CM | POA: Diagnosis present

## 2013-10-07 LAB — COMPREHENSIVE METABOLIC PANEL
ALT: 128 U/L — ABNORMAL HIGH (ref 0–53)
ANION GAP: 20 — AB (ref 5–15)
AST: 95 U/L — ABNORMAL HIGH (ref 0–37)
Albumin: 2.9 g/dL — ABNORMAL LOW (ref 3.5–5.2)
Alkaline Phosphatase: 96 U/L (ref 39–117)
BUN: 26 mg/dL — AB (ref 6–23)
CALCIUM: 8.8 mg/dL (ref 8.4–10.5)
CO2: 22 mEq/L (ref 19–32)
CREATININE: 5.29 mg/dL — AB (ref 0.50–1.35)
Chloride: 92 mEq/L — ABNORMAL LOW (ref 96–112)
GFR calc Af Amer: 11 mL/min — ABNORMAL LOW (ref >90)
GFR calc non Af Amer: 9 mL/min — ABNORMAL LOW (ref >90)
Glucose, Bld: 118 mg/dL — ABNORMAL HIGH (ref 70–99)
Potassium: 4.2 mEq/L (ref 3.7–5.3)
Sodium: 134 mEq/L — ABNORMAL LOW (ref 137–147)
TOTAL PROTEIN: 7.3 g/dL (ref 6.0–8.3)
Total Bilirubin: 0.7 mg/dL (ref 0.3–1.2)

## 2013-10-07 LAB — GLUCOSE, CAPILLARY
GLUCOSE-CAPILLARY: 227 mg/dL — AB (ref 70–99)
GLUCOSE-CAPILLARY: 172 mg/dL — AB (ref 70–99)
GLUCOSE-CAPILLARY: 155 mg/dL — AB (ref 70–99)
GLUCOSE-CAPILLARY: 195 mg/dL — AB (ref 70–99)
Glucose-Capillary: 99 mg/dL (ref 70–99)
Glucose-Capillary: 188 mg/dL — ABNORMAL HIGH (ref 70–99)

## 2013-10-07 LAB — PROTIME-INR
INR: 3.6 — ABNORMAL HIGH (ref 0.00–1.49)
PROTHROMBIN TIME: 35.9 s — AB (ref 11.6–15.2)

## 2013-10-07 LAB — TSH: TSH: 1.33 u[IU]/mL (ref 0.350–4.500)

## 2013-10-07 MED ORDER — HEPARIN SODIUM (PORCINE) 1000 UNIT/ML DIALYSIS
3500.0000 [IU] | INTRAMUSCULAR | Status: DC | PRN
  Filled 2013-10-07: qty 4

## 2013-10-07 MED ORDER — LIDOCAINE HCL (PF) 1 % IJ SOLN: 5.0000 mL | INTRAMUSCULAR | Status: DC | PRN

## 2013-10-07 MED ORDER — ALLOPURINOL 150 MG HALF TABLET
150.0000 mg | ORAL_TABLET | Freq: Every day | ORAL | Status: DC
  Administered 2013-10-08 – 2013-10-09 (×2): 150 mg via ORAL
  Filled 2013-10-07 (×3): qty 1

## 2013-10-07 MED ORDER — SODIUM CHLORIDE 0.9 % IV SOLN: 100.0000 mL | INTRAVENOUS | Status: DC | PRN

## 2013-10-07 MED ORDER — NEPRO/CARBSTEADY PO LIQD: 237.0000 mL | ORAL | Status: DC | PRN

## 2013-10-07 MED ORDER — HEPARIN SODIUM (PORCINE) 1000 UNIT/ML DIALYSIS
1000.0000 [IU] | INTRAMUSCULAR | Status: DC | PRN
  Filled 2013-10-07: qty 1

## 2013-10-07 MED ORDER — RENA-VITE PO TABS
1.0000 | ORAL_TABLET | Freq: Every day | ORAL | Status: DC
  Administered 2013-10-07 – 2013-10-09 (×3): 1 via ORAL
  Filled 2013-10-07 (×3): qty 1

## 2013-10-07 MED ORDER — LIDOCAINE-PRILOCAINE 2.5-2.5 % EX CREA: 1.0000 "application " | TOPICAL_CREAM | CUTANEOUS | Status: DC | PRN

## 2013-10-07 MED ORDER — ALTEPLASE 2 MG IJ SOLR
2.0000 mg | Freq: Once | INTRAMUSCULAR | Status: AC | PRN
  Filled 2013-10-07: qty 2

## 2013-10-07 MED ORDER — PENTAFLUOROPROP-TETRAFLUOROETH EX AERO: 1.0000 "application " | INHALATION_SPRAY | CUTANEOUS | Status: DC | PRN

## 2013-10-07 NOTE — Progress Notes (Signed)
[facility] PROGRESS NOTE  Alan [name] HBZ:[id number] DOB: 18-Mar-1931 DOA: 10/04/2013 PCP: Limmie Patricia, MD  Summary: 19 AA male with chronic hypotension, paf, ESRD, presented with vomiting diarrhea, confusion, hypotension, lactic acidosis, sepsis. GI pathogen panel negative and gi symptoms resolved. 1/2 BC positive, growing staph species.   Assessment/Plan:  Principal Problem:   Gastroenteritis: c diff neg. GI pathogen panel negative.  resolved Active Problems:   Sepsis: 1/2 staph species. Started on vanc pending results.  Hopefully, contaminant. Will repeat BC in am   Aortic stenosis   CAD (coronary artery disease)   End stage renal disease on dialysis   Chronic anticoagulation:  Coumadin per pharmacy   Paroxysmal Atrial fibrillation   Hx of BKA, bilateral. C/o weakness. Will get PT eval for strengthening, OOB   Acute encephalopathy resolved   Elevated transaminase level resolving. Likely shock liver. Hepatitis panel negative DM 2 with renal complications: cont SSI Resting Sinus tachycardia may be related infectious process, but should be improving by now. Coumadin therapeutic.  Check TSH. Per RN, periods of atrial fib. BP too low for beta blocker.  Code Status:  full Family Communication:  Wife Disposition Plan:  Transfer to tele  Consultants:  nephrology  Procedures:     Antibiotics:  Vancomycin 9/28 -  HPI/Subjective: No N/V/D. No abd pain. Feels well and anxious to go home. No dyspnea or CP  Objective: Filed Vitals:   10/07/13 1011  BP: 83/38  Pulse: 125  Temp:   Resp:     Intake/Output Summary (Last 24 hours) at 10/07/13 1155 Last data filed at 10/07/13 1009  Gross per 24 hour  Intake    123 ml  Output   1796 ml  Net  -1673 ml   Filed Weights   10/06/13 0801 10/06/13 1233 10/06/13 2132  Weight: 99.4 kg (219 lb 2.2 oz) 96.7 kg (213 lb 3 oz) 96.701 kg (213 lb 3 oz)   Tele: sinus tach  Exam:   General:  In chair reading  newspaper. comfortable  Cardiovascular: RR, fast  Respiratory: diminished throughout  Abdomen: S, NT, ND  Ext: BKAs   Basic Metabolic Panel:  Recent Labs Lab 10/04/13 0214 10/04/13 0229 10/05/13 0314 10/06/13 0249 10/07/13 0701  NA 138 136* 133* 133* 134*  K 3.8 3.7 4.2 3.9 4.2  CL 94* 98 91* 91* 92*  CO2 25  --  24 22 22   GLUCOSE 214* 215* 103* 83 118*  BUN 19 20 31* 26* 26*  CREATININE 5.49* 5.60* 7.66* 6.40* 5.29*  CALCIUM 9.0  --  9.3 9.0 8.8  PHOS  --   --   --  3.5  --    Liver Function Tests:  Recent Labs Lab 10/04/13 0214 10/05/13 0314 10/06/13 0249 10/07/13 0701  AST 913* 262*  --  95*  ALT 577* 285*  --  128*  ALKPHOS 137* 108  --  96  BILITOT 2.0* 1.8*  --  0.7  PROT 7.7 7.3  --  7.3  ALBUMIN 3.7 3.2* 3.0* 2.9*   No results found for this basename: LIPASE, AMYLASE,  in the last 168 hours No results found for this basename: AMMONIA,  in the last 168 hours CBC:  Recent Labs Lab 10/04/13 0214 10/04/13 0229 10/05/13 0314 10/06/13 1330  WBC 8.7  --  8.6 7.0  NEUTROABS 8.0*  --   --   --   HGB 12.9* 15.0 12.4* 13.4  HCT 41.0 44.0 38.5* 40.9  MCV 94.0  --  91.4 93.2  PLT 198  --  208 194   Cardiac Enzymes:  Recent Labs Lab 10/04/13 0214  TROPONINI <0.30   BNP (last 3 results) No results found for this basename: PROBNP,  in the last 8760 hours CBG:  Recent Labs Lab 10/06/13 0744 10/06/13 1314 10/06/13 1725 10/06/13 2135 10/07/13 0821  GLUCAP 88 99 227* 151* 188*    Recent Results (from the past 240 hour(s))  CULTURE, BLOOD (ROUTINE X 2)     Status: None   Collection Time    10/04/13  2:14 AM      Result Value Ref Range Status   Specimen Description BLOOD LEFT ARM   Final   Special Requests BOTTLES DRAWN AEROBIC ONLY 9CCS   Final   Culture  Setup Time     Final   Value: 10/04/2013 13:30     Performed at [facility]   Culture     Final   Value:        BLOOD CULTURE RECEIVED NO GROWTH TO DATE CULTURE WILL BE HELD  FOR 5 DAYS BEFORE ISSUING A FINAL NEGATIVE REPORT     Note: Culture results may be compromised due to an excessive volume of blood received in culture bottles.     Performed at [facility]   Report Status PENDING   Incomplete  CULTURE, BLOOD (ROUTINE X 2)     Status: None   Collection Time    10/04/13  2:20 AM      Result Value Ref Range Status   Specimen Description BLOOD LEFT HAND   Final   Special Requests BOTTLES DRAWN AEROBIC ONLY 9CCS   Final   Culture  Setup Time     Final   Value: 10/04/2013 13:30     Performed at [facility]   Culture     Final   Value: STAPHYLOCOCCUS SPECIES     Note: Culture results may be compromised due to an excessive volume of blood received in culture bottles. Gram Stain Report Called to,Read Back By and Verified With: MORGAN SCHMITZ @ 1522 ON [id number] BY [facility]     Performed at [facility]   Report Status PENDING   Incomplete  MRSA PCR SCREENING     Status: None   Collection Time    10/04/13  7:55 AM      Result Value Ref Range Status   MRSA by PCR NEGATIVE  NEGATIVE Final   Comment:            The GeneXpert MRSA Assay (FDA     approved for NASAL specimens     only), is one component of a     comprehensive MRSA colonization     surveillance program. It is not     intended to diagnose MRSA     infection nor to guide or     monitor treatment for     MRSA infections.  CLOSTRIDIUM DIFFICILE BY PCR     Status: None   Collection Time    10/04/13  2:42 PM      Result Value Ref Range Status   C difficile by pcr NEGATIVE  NEGATIVE Final     Studies: No results found.  Scheduled Meds: . aspirin EC  81 mg Oral Daily  . calcium carbonate  1 tablet Oral TID WC  . doxercalciferol  3 mcg Intravenous Q T,Th,Sa-HD  . gabapentin  100 mg Oral QHS  . insulin aspart  0-9 Units Subcutaneous TID  WC  . midodrine  10 mg Oral TID WC  . pneumococcal 13-valent conjugate vaccine  0.5 mL Intramuscular Tomorrow-1000  . sodium chloride  3  mL Intravenous Q12H  . sodium chloride  3 mL Intravenous Q12H  . vancomycin  1,000 mg Intravenous Q T,Th,Sa-HD  . Warfarin - Pharmacist Dosing Inpatient   Does not apply q1800   Continuous Infusions:   Time spent: 35 minutes  [place] [facility] Pager [phone]. If 7PM-7AM, please contact night-coverage at [email or web address], password [facility] 10/07/2013, 11:55 AM  LOS: 3 days

## 2013-10-07 NOTE — Progress Notes (Signed)
Subjective:  Tolerated hd yesterday/ no sob/ had HD 2 days consecutively for vol uf  Objective Vital signs in last 24 hours: Filed Vitals:   10/06/13 1700 10/06/13 1800 10/06/13 2132 10/07/13 0541  BP:   110/54 100/80  Pulse: 123 124 101 105  Temp:   98.7 F (37.1 C) 98.1 F (36.7 C)  TempSrc:   Oral Oral  Resp: 14 15 15 15   Height:      Weight:   96.701 kg (213 lb 3 oz)   SpO2: 95% 96% 98% 100%   Weight change: -1.8 kg (-3 lb 15.5 oz)  Physical Exam: General: Alert, NAD, Son in room Heart: RRR, 2/6 sem lsb , no rub or gallop Lungs: CTA bilat Abdomen: obese, soft, NT, ND Extremities: bilat BKA stumps no edema  Dialysis Access: pos. Bruit  AVF   Dialysis prescription:  Tuesday, Thursday and Saturday. 4 hours 15 minutes, blood flow 500, dialysate 1.5 autoflow, 2K/2 calcium, UF profile 4, linear sodium modeling, Hectorol 3 mcg IV 3 times a week, heparin 3400 units bolus. Dry weight 101 kg   Problem/Plan: 1. Dyspnea:  HD 2 consecutive day  With  volume excess  Improved  5 kg below edw by wts/ 2. Acute Gastroenteritis: Testing so far negative, resolved  nausea/vomiting or diarrhea since admit  3. End-stage renal disease: NW TTS next hd in am / using Midodrine for bp support on hd 4. History of coronary artery disease and inoperable aortic stenosis: Continue to monitor closely and volume management as permitted by dialysis.  5. Metabolic bone disease: Resumed binders  and  vitamin D receptor analogs.  6. Anemia of chronic kidney disease: Hemoglobin currently> 11 and, no  ESA  7. Dementia-Currently oriented X3/recoginizing  me from HD Center/ Son reports some confusion  When Pain meds  Given occasionally     Ernest Haber, PA-C [place] [phone] 10/07/2013,10:03 AM  LOS: 3 days   Labs: Basic Metabolic Panel:  Recent Labs Lab 10/05/13 0314 10/06/13 0249 10/07/13 0701  NA 133* 133* 134*  K 4.2 3.9 4.2  CL 91* 91* 92*  CO2 24 22 22   GLUCOSE  103* 83 118*  BUN 31* 26* 26*  CREATININE 7.66* 6.40* 5.29*  CALCIUM 9.3 9.0 8.8  PHOS  --  3.5  --    Liver Function Tests:  Recent Labs Lab 10/04/13 0214 10/05/13 0314 10/06/13 0249 10/07/13 0701  AST 913* 262*  --  95*  ALT 577* 285*  --  128*  ALKPHOS 137* 108  --  96  BILITOT 2.0* 1.8*  --  0.7  PROT 7.7 7.3  --  7.3  ALBUMIN 3.7 3.2* 3.0* 2.9*   No results found for this basename: LIPASE, AMYLASE,  in the last 168 hours No results found for this basename: AMMONIA,  in the last 168 hours CBC:  Recent Labs Lab 10/04/13 0214 10/04/13 0229 10/05/13 0314 10/06/13 1330  WBC 8.7  --  8.6 7.0  NEUTROABS 8.0*  --   --   --   HGB 12.9* 15.0 12.4* 13.4  HCT 41.0 44.0 38.5* 40.9  MCV 94.0  --  91.4 93.2  PLT 198  --  208 194   Cardiac Enzymes:  Recent Labs Lab 10/04/13 0214  TROPONINI <0.30   CBG:  Recent Labs Lab 10/06/13 0744 10/06/13 1314 10/06/13 1725 10/06/13 2135 10/07/13 0821  GLUCAP 88 99 227* 151* 188*    Studies/Results: No results found. Medications:   . aspirin  EC  81 mg Oral Daily  . calcium carbonate  1 tablet Oral TID WC  . doxercalciferol  3 mcg Intravenous Q T,Th,Sa-HD  . gabapentin  100 mg Oral QHS  . insulin aspart  0-9 Units Subcutaneous TID WC  . midodrine  10 mg Oral TID WC  . pneumococcal 13-valent conjugate vaccine  0.5 mL Intramuscular Tomorrow-1000  . sodium chloride  3 mL Intravenous Q12H  . sodium chloride  3 mL Intravenous Q12H  . vancomycin  1,000 mg Intravenous Q T,Th,Sa-HD  . Warfarin - Pharmacist Dosing Inpatient   Does not apply [phone]

## 2013-10-07 NOTE — Progress Notes (Signed)
Occupational Therapy Treatment Patient Details Name: Alan [name] MRN: [id number] DOB: 08-18-1931 Today's Date: 10/07/2013    History of present illness pt presents with Encephalopathy, N/V/D, and AMS. pt with Hx Bil BKAs.    OT comments  Pt seen today to work on transfers and bed mobility to prepare for ADLs at home. Pt requires (A) to don Bil LEs, however he is at Supervision level for transfers while wearing them. Pt continues to make progress with mobility and independence, but would benefit from continued acute OT.    Follow Up Recommendations  Home health OT;Supervision/Assistance - 24 hour    Equipment Recommendations  None recommended by OT    Recommendations for Other Services      Precautions / Restrictions Precautions Precautions: Fall Precaution Comments: pt with Bil prosthetics.   Restrictions Weight Bearing Restrictions: No       Mobility Bed Mobility Overal bed mobility: Needs Assistance Bed Mobility: Supine to Sit     Supine to sit: Min guard;HOB elevated     General bed mobility comments: Min guard to get EOB without use of bed rail, but HOB elevated.   Transfers Overall transfer level: Needs assistance Equipment used: None Transfers: Lateral/Scoot Transfers          Lateral/Scoot Transfers: Supervision General transfer comment: Supervision for safety and setup only. Pt requires increased time and use of Bil prosthesis to (A) with weightshift.     Balance Overall balance assessment: Needs assistance Sitting-balance support: Single extremity supported Sitting balance-Leahy Scale: Fair Sitting balance - Comments: pt with LOB during dynamic sitting on EOB Postural control: Right lateral lean;Posterior lean                         ADL Overall ADL's : Needs assistance/impaired     Grooming: Set up;Sitting           Upper Body Dressing : Set up;Supervision/safety;Sitting       Toilet Transfer:  Supervision/safety;Requires drop arm;Set up (lateral scoot from bed>drop arm recliner) Toilet Transfer Details (indicate cue type and reason): pt able to don Bil prosthetics with assist and performed lateral/scoot transfer with Supervision.                            Cognition  Arousal/Alertness: Awake/Alert Behavior During Therapy: WFL for tasks assessed/performed Overall Cognitive Status: History of cognitive impairments - at baseline                         Exercises Other Exercises Other Exercises: Educated pt in chair push ups to promote UE strength needed for lateral/scoot transfers.            Pertinent Vitals/ Pain       Pain Assessment: No/denies pain         Frequency Min 2X/week     Progress Toward Goals  OT Goals(current goals can now be found in the care plan section)  Progress towards OT goals: Progressing toward goals  Acute Rehab OT Goals Patient Stated Goal: to go home today OT Goal Formulation: With patient/family Time For Goal Achievement: 10/19/13 Potential to Achieve Goals: Good ADL Goals Pt Will Transfer to Toilet: with supervision;bedside commode (with wife assisting) Pt Will Perform Toileting - Clothing Manipulation and hygiene: with supervision;sitting/lateral leans;with caregiver independent in assisting Additional ADL Goal #1: wife/pt to complete bed mobility with compensatory techiniques in preparatio for ADL  Plan Discharge plan remains appropriate       End of Session Equipment Utilized During Treatment: Other (comment) (pt's Bil LE prosthesis)   Activity Tolerance Patient tolerated treatment well   Patient Left in chair;with call bell/phone within reach   Nurse Communication Mobility status;Other (comment) (pt in recliner for dinner)        Time: 1721-1745 OT Time Calculation (min): 24 min  Charges: OT General Charges $OT Visit: 1 Procedure OT Treatments $Self Care/Home Management : 8-22 mins $Therapeutic  Activity: 8-22 mins  Villa Herb M 10/07/2013, 5:59 PM  Cyndie Chime, OTR/L Occupational Therapist [phone] (pager)

## 2013-10-07 NOTE — Progress Notes (Signed)
I have personally seen and examined this patient and agree with the assessment/plan as outlined above by [facility] PA. Improved SOB after lowered EDW. Will await evaluation by PT for OP needs upon DC.  Nikol Lemar K.,MD 10/07/2013 10:39 AM

## 2013-10-07 NOTE — Care Management Note (Signed)
CARE MANAGEMENT NOTE 10/07/2013  Patient:  Alan [name], Alan [name]   Account Number:  [id number]  Date Initiated:  10/07/2013  Documentation initiated by:  Yaden Seith  Subjective/Objective Assessment:   CM following for progression and d/c planning.     Action/Plan:   10/07/2013 Met with pt re d/c planning and IM given.   Anticipated DC Date:     Anticipated DC Plan:  [place]         Choice offered to / List presented to:             Status of service:   Medicare Important Message given?  YES (If response is "NO", the following Medicare IM given date fields will be blank) Date Medicare IM given:  10/07/2013 Medicare IM given by:  Djimon Lundstrom Date Additional Medicare IM given:   Additional Medicare IM given by:    Discharge Disposition:    Per UR Regulation:    If discussed at Long Length of Stay Meetings, dates discussed:    Comments:

## 2013-10-07 NOTE — Progress Notes (Signed)
Physical Therapy Treatment Patient Details Name: Alan [name] MRN: [id number] DOB: 11-19-1931 Today's Date: 10/07/2013    History of Present Illness pt presents with Encephalopathy, N/V/D, and AMS. pt with Hx Bil BKAs.     PT Comments    Session focused on address pt and wife safety with transfers from bed <> chair to simulate home enviroment. Pt at supervision level for mobility and wife (A) with donning bil prosthesis'. Will cont to follow to address overall deconditioning and weaknesses.   Follow Up Recommendations  Home health PT;Supervision/Assistance - 24 hour     Equipment Recommendations  None recommended by PT    Recommendations for Other Services       Precautions / Restrictions Precautions Precautions: Fall Precaution Comments: pt with Bil prosthetics.   Restrictions Weight Bearing Restrictions: No    Mobility  Bed Mobility Overal bed mobility: Needs Assistance Bed Mobility: Supine to Sit     Supine to sit: Min guard;HOB elevated     General bed mobility comments: pt reports he has an elevated bed at home; min guard to initiate movement of trunk; also min guard with sitting EOB initially; pt does demo LOB intermittently at EOB to Rt but recovers and corrects independently  Transfers Overall transfer level: Needs assistance Equipment used: None Transfers: Lateral/Scoot Transfers          Lateral/Scoot Transfers: Supervision General transfer comment: supervision for setup (A) and safety only; pt able to laterally scoot to drop arm chair with incr time and bil prosthesis'  donned to (A) with weightshift   Ambulation/Gait                 Stairs            Wheelchair Mobility    Modified Rankin (Stroke Patients Only)       Balance Overall balance assessment: Needs assistance Sitting-balance support: Bilateral upper extremity supported;Single extremity supported Sitting balance-Leahy Scale: Fair Sitting balance - Comments: pt  with multiple LOB of balance laterally when donning prosthesis' ; recovers and corrects with time independently ; wife also (A) with donning of prosthsis'  Postural control: Right lateral lean;Posterior lean                          Cognition Arousal/Alertness: Awake/alert Behavior During Therapy: WFL for tasks assessed/performed Overall Cognitive Status: History of cognitive impairments - at baseline                      Exercises      General Comments General comments (skin integrity, edema, etc.): wife present and (A) pt with prosthesis'; discussed D/C recommendations for HHPT to progress mobility and address safety with home D/C      Pertinent Vitals/Pain Pain Assessment: No/denies pain    Home Living                      Prior Function            PT Goals (current goals can now be found in the care plan section) Acute Rehab PT Goals Patient Stated Goal: to go home today PT Goal Formulation: With patient Time For Goal Achievement: 10/19/13 Potential to Achieve Goals: Good Progress towards PT goals: Progressing toward goals    Frequency  Min 3X/week    PT Plan Current plan remains appropriate    Co-evaluation             End  of Session Equipment Utilized During Treatment: Other (comment) (bil prosthesis ) Activity Tolerance: Patient tolerated treatment well Patient left: in chair;with call bell/phone within reach;with family/visitor present     Time: 7903-8333 PT Time Calculation (min): 24 min  Charges:  $Therapeutic Activity: 23-37 mins                    G CodesGustavus Bryant, [employer] 10/07/2013, 2:09 PM

## 2013-10-07 NOTE — Progress Notes (Addendum)
ANTICOAGULATION CONSULT NOTE - Follow Up Consult  Pharmacy Consult for Coumadin Indication: atrial fibrillation  Allergies  Allergen Reactions  . Ancef [Cefazolin] Rash    Patient cant recall if allergy to this med exists.     Patient Measurements: Height: 5' (152.4 cm) Weight: 213 lb 3 oz (96.701 kg) IBW/kg (Calculated) : 50 Heparin Dosing Weight:    Vital Signs: Temp: 98.1 F (36.7 C) (09/30 0541) Temp src: Oral (09/30 0541) BP: 100/80 mmHg (09/30 0541) Pulse Rate: 105 (09/30 0541)  Labs:  Recent Labs  10/05/13 0314 10/06/13 0249 10/06/13 1330 10/07/13 0701  HGB 12.4*  --  13.4  --   HCT 38.5*  --  40.9  --   PLT 208  --  194  --   LABPROT  --  36.7*  --  35.9*  INR  --  3.70*  --  3.60*  CREATININE 7.66* 6.40*  --  5.29*    Estimated Creatinine Clearance: 11 ml/min (by C-G formula based on Cr of 5.29).   Assessment:  80 yo male on warfarin prior to admission for AFib.  Pt is ESRD on HD TuThS and is s/p bilateral BKA. PTA warfarin dose: 2.5 mg qWed, 5 mg on all other days.  Pt initially presented with subtherapeutic INR. After receiving two doses inpatient, INR is supratherapeutic at 3.6 today.  Pt has no s/sx bleeding.    Goal of Therapy:  INR 2-3 Monitor platelets by anticoagulation protocol: Yes   Plan:  Hold warfarin given high INR F/u daily PT/INR   Hughes Better, PharmD, BCPS Clinical Pharmacist Pager: [phone] 10/07/2013 8:45 AM

## 2013-10-07 NOTE — Discharge Instructions (Signed)

## 2013-10-08 DIAGNOSIS — N186 End stage renal disease: Secondary | ICD-10-CM

## 2013-10-08 DIAGNOSIS — R740 Nonspecific elevation of levels of transaminase and lactic acid dehydrogenase [LDH]: Secondary | ICD-10-CM

## 2013-10-08 DIAGNOSIS — Z992 Dependence on renal dialysis: Secondary | ICD-10-CM

## 2013-10-08 DIAGNOSIS — R7881 Bacteremia: Secondary | ICD-10-CM

## 2013-10-08 DIAGNOSIS — R404 Transient alteration of awareness: Secondary | ICD-10-CM

## 2013-10-08 LAB — CULTURE, BLOOD (ROUTINE X 2)

## 2013-10-08 LAB — COMPREHENSIVE METABOLIC PANEL
ALBUMIN: 3.1 g/dL — AB (ref 3.5–5.2)
ALK PHOS: 97 U/L (ref 39–117)
ALT: 109 U/L — ABNORMAL HIGH (ref 0–53)
AST: 79 U/L — ABNORMAL HIGH (ref 0–37)
Anion gap: 15 (ref 5–15)
BILIRUBIN TOTAL: 0.6 mg/dL (ref 0.3–1.2)
BUN: 37 mg/dL — ABNORMAL HIGH (ref 6–23)
CHLORIDE: 94 meq/L — AB (ref 96–112)
CO2: 25 meq/L (ref 19–32)
Calcium: 9.3 mg/dL (ref 8.4–10.5)
Creatinine, Ser: 7.22 mg/dL — ABNORMAL HIGH (ref 0.50–1.35)
GFR calc Af Amer: 7 mL/min — ABNORMAL LOW (ref >90)
GFR, EST NON AFRICAN AMERICAN: 6 mL/min — AB (ref >90)
Glucose, Bld: 147 mg/dL — ABNORMAL HIGH (ref 70–99)
POTASSIUM: 4.1 meq/L (ref 3.7–5.3)
SODIUM: 134 meq/L — AB (ref 137–147)
Total Protein: 7.1 g/dL (ref 6.0–8.3)

## 2013-10-08 LAB — CBC
HCT: 37 % — ABNORMAL LOW (ref 39.0–52.0)
Hemoglobin: 11.9 g/dL — ABNORMAL LOW (ref 13.0–17.0)
MCH: 29.1 pg (ref 26.0–34.0)
MCHC: 32.2 g/dL (ref 30.0–36.0)
MCV: 90.5 fL (ref 78.0–100.0)
PLATELETS: 234 10*3/uL (ref 150–400)
RBC: 4.09 MIL/uL — AB (ref 4.22–5.81)
RDW: 13.4 % (ref 11.5–15.5)
WBC: 7.1 10*3/uL (ref 4.0–10.5)

## 2013-10-08 LAB — PROTIME-INR
INR: 3.74 — AB (ref 0.00–1.49)
Prothrombin Time: 37 seconds — ABNORMAL HIGH (ref 11.6–15.2)

## 2013-10-08 LAB — GLUCOSE, CAPILLARY
GLUCOSE-CAPILLARY: 111 mg/dL — AB (ref 70–99)
Glucose-Capillary: 231 mg/dL — ABNORMAL HIGH (ref 70–99)
Glucose-Capillary: 135 mg/dL — ABNORMAL HIGH (ref 70–99)

## 2013-10-08 MED ORDER — DOXERCALCIFEROL 4 MCG/2ML IV SOLN
INTRAVENOUS | Status: AC
  Administered 2013-10-08: 3 ug via INTRAVENOUS
  Filled 2013-10-08: qty 2

## 2013-10-08 NOTE — Procedures (Signed)
Patient seen on Hemodialysis. QB 400, UF goal 2.5L Treatment adjusted as needed.  Elmarie Shiley MD [facility]. Office # [phone] Pager # [phone] 9:09 AM

## 2013-10-08 NOTE — Progress Notes (Signed)
Patient ID: Alan [name], male   DOB: 12-31-1931, 80 y.o.   MRN: [id number]  [facility] Progress Note   Assessment/ Plan:   1. Dyspnea: Suspected to be from volume excess- responded well to UF at HD with lowering of his EDW. Tachycardia has resolved 2. Acute Gastroenteritis: Resolved with testing so far negative. 3. End-stage renal disease: Undergoing dialysis today per his usual outpatient schedule  (pre-HD weight today 100.4) 4. History of coronary artery disease and inoperable aortic stenosis: Continue to monitor closely and volume management as permitted by dialysis.  5. Metabolic bone disease: phosphorus controlled on calcium carbonate and on VDRA for PTH control.  6. Anemia of chronic kidney disease: Hemoglobin currently greater than 11, no indications for ESA  Subjective:   Reports to have had a comfortable night and breathing better at this time.    Objective:   BP 93/44  Pulse 85  Temp(Src) 98.3 F (36.8 C) (Oral)  Resp 17  Ht 5' (1.524 m)  Wt 100.4 kg (221 lb 5.5 oz)  BMI 43.23 kg/m2  SpO2 98%  Physical Exam: Gen: Comfortable resting on dialysis CVS: Pulse regular tachycardia, S1 and S2 with ESM Resp: Clear to auscultation bilaterally-no distinct rales or rhonchi Abd: Soft, flat, nontender Ext: Status post bilateral BKA  Labs: BMET  Recent Labs Lab 10/04/13 0214 10/04/13 0229 10/05/13 0314 10/06/13 0249 10/07/13 0701 10/08/13 0715  NA 138 136* 133* 133* 134* 134*  K 3.8 3.7 4.2 3.9 4.2 4.1  CL 94* 98 91* 91* 92* 94*  CO2 25  --  24 22 22 25   GLUCOSE 214* 215* 103* 83 118* 147*  BUN 19 20 31* 26* 26* 37*  CREATININE 5.49* 5.60* 7.66* 6.40* 5.29* 7.22*  CALCIUM 9.0  --  9.3 9.0 8.8 9.3  PHOS  --   --   --  3.5  --   --    CBC  Recent Labs Lab 10/04/13 0214 10/04/13 0229 10/05/13 0314 10/06/13 1330 10/08/13 0715  WBC 8.7  --  8.6 7.0 7.1  NEUTROABS 8.0*  --   --   --   --   HGB 12.9* 15.0 12.4* 13.4 11.9*  HCT 41.0 44.0  38.5* 40.9 37.0*  MCV 94.0  --  91.4 93.2 90.5  PLT 198  --  208 194 234   Medications:    . allopurinol  150 mg Oral Daily  . aspirin EC  81 mg Oral Daily  . calcium carbonate  1 tablet Oral TID WC  . doxercalciferol  3 mcg Intravenous Q T,Th,Sa-HD  . gabapentin  100 mg Oral QHS  . insulin aspart  0-9 Units Subcutaneous TID WC  . midodrine  10 mg Oral TID WC  . multivitamin  1 tablet Oral Daily  . pneumococcal 13-valent conjugate vaccine  0.5 mL Intramuscular Tomorrow-1000  . sodium chloride  3 mL Intravenous Q12H  . sodium chloride  3 mL Intravenous Q12H  . vancomycin  1,000 mg Intravenous Q T,Th,Sa-HD  . Warfarin - Pharmacist Dosing Inpatient   Does not apply [id number]   Elmarie Shiley, MD 10/08/2013, 9:05 AM

## 2013-10-08 NOTE — Progress Notes (Signed)
ANTICOAGULATION and ANTIBIOTIC CONSULT NOTE - Follow Up Consult  Pharmacy Consult for coumadin; vancomycin Indication: atrial fibrillation; sepsis  Allergies  Allergen Reactions  . Ancef [Cefazolin] Rash    Patient cant recall if allergy to this med exists.     Patient Measurements: Height: 5' (152.4 cm) Weight: 221 lb 5.5 oz (100.4 kg) IBW/kg (Calculated) : 50  Vital Signs: Temp: 98.3 F (36.8 C) (10/01 0657) Temp src: Oral (10/01 0657) BP: 87/42 mmHg (10/01 1043) Pulse Rate: 96 (10/01 1043)  Labs:  Recent Labs  10/06/13 0249 10/06/13 1330 10/07/13 0701 10/08/13 0500 10/08/13 0715  HGB  --  13.4  --   --  11.9*  HCT  --  40.9  --   --  37.0*  PLT  --  194  --   --  234  LABPROT 36.7*  --  35.9* 37.0*  --   INR 3.70*  --  3.60* 3.74*  --   CREATININE 6.40*  --  5.29*  --  7.22*    Estimated Creatinine Clearance: 8.2 ml/min (by C-G formula based on Cr of 7.22).  Assessment: Patient is a 80 y.o M on coumadin for afib.  INR remains supra-therapeutic at 3.74.  Hgb down slightly to 11.9.  No bleeding documented.  He's currently on vancomycin for r/o sepsis. Wbc wnl, afeb. He received HD on 9/29 -- tolerated 4 hr @ BFR 400, for HD today. 9/27: Vanco>> Zosyn x1  9/27 blood cx: CNS 1/2 9/27 cdiff: negative 9/27 GI PCR: all neg 10/01 bcx x2>> pending   Goal of Therapy:  INR 2-3; pre-HD vancomycin level = 15-25    Plan:  1) hold coumadin today 2) continue vancomycin 1gm IV QHD  3) will plan on checking vancomycin level later this week if still on abx 4) f/u with cultures  Waleed Dettman P 10/08/2013,10:47 AM

## 2013-10-08 NOTE — Progress Notes (Signed)
[facility] PROGRESS NOTE  Alan [name] HFW:[id number] DOB: 07-20-1931 DOA: 10/04/2013 PCP: Limmie Patricia, MD  Summary: 25 AA male with chronic hypotension, paf, ESRD, presented with vomiting diarrhea, confusion, hypotension, lactic acidosis, sepsis. GI pathogen panel negative and gi symptoms resolved. 1/2 BC positive, growing staph species.   Assessment/Plan:     Gastroenteritis: c diff neg. GI pathogen panel negative.  resolved    Sepsis: 1/2 staph species. Started on vanc pending results.  Hopefully, contaminant. Will repeat BC in am   Aortic stenosis   CAD (coronary artery disease)   End stage renal disease on dialysis   Chronic anticoagulation:  Coumadin per pharmacy   Paroxysmal Atrial fibrillation   Hx of BKA, bilateral. C/o weakness. Will get PT eval for strengthening, OOB   Acute encephalopathy resolved   Elevated transaminase level resolving. Likely shock liver. Hepatitis panel negative DM 2 with renal complications: cont SSI Resting Sinus tachycardia; Coumadin therapeutic.  TSH ok. Per RN, periods of atrial fib. BP too low for beta blocker.  Code Status:  full Family Communication:  Wife in hall Disposition Plan:    Consultants:  nephrology  Procedures:     Antibiotics:  Vancomycin 9/28 -  HPI/Subjective: Wants to go home He and wife married 40 years   Objective: Filed Vitals:   10/08/13 1122  BP: 93/47  Pulse: 83  Temp: 98 F (36.7 C)  Resp: 24    Intake/Output Summary (Last 24 hours) at 10/08/13 1153 Last data filed at 10/08/13 1122  Gross per 24 hour  Intake    720 ml  Output    482 ml  Net    238 ml   Filed Weights   10/07/13 2047 10/08/13 0657 10/08/13 1122  Weight: 96.701 kg (213 lb 3 oz) 100.4 kg (221 lb 5.5 oz) 99.8 kg (220 lb 0.3 oz)     Exam:   General:  NAD  Cardiovascular: RR, fast  Respiratory: diminished throughout  Abdomen: S, NT, ND  Ext: BKAs   Basic Metabolic Panel:  Recent Labs Lab  10/04/13 0214 10/04/13 0229 10/05/13 0314 10/06/13 0249 10/07/13 0701 10/08/13 0715  NA 138 136* 133* 133* 134* 134*  K 3.8 3.7 4.2 3.9 4.2 4.1  CL 94* 98 91* 91* 92* 94*  CO2 25  --  24 22 22 25   GLUCOSE 214* 215* 103* 83 118* 147*  BUN 19 20 31* 26* 26* 37*  CREATININE 5.49* 5.60* 7.66* 6.40* 5.29* 7.22*  CALCIUM 9.0  --  9.3 9.0 8.8 9.3  PHOS  --   --   --  3.5  --   --    Liver Function Tests:  Recent Labs Lab 10/04/13 0214 10/05/13 0314 10/06/13 0249 10/07/13 0701 10/08/13 0715  AST 913* 262*  --  95* 79*  ALT 577* 285*  --  128* 109*  ALKPHOS 137* 108  --  96 97  BILITOT 2.0* 1.8*  --  0.7 0.6  PROT 7.7 7.3  --  7.3 7.1  ALBUMIN 3.7 3.2* 3.0* 2.9* 3.1*   No results found for this basename: LIPASE, AMYLASE,  in the last 168 hours No results found for this basename: AMMONIA,  in the last 168 hours CBC:  Recent Labs Lab 10/04/13 0214 10/04/13 0229 10/05/13 0314 10/06/13 1330 10/08/13 0715  WBC 8.7  --  8.6 7.0 7.1  NEUTROABS 8.0*  --   --   --   --   HGB 12.9* 15.0 12.4* 13.4 11.9*  HCT  41.0 44.0 38.5* 40.9 37.0*  MCV 94.0  --  91.4 93.2 90.5  PLT 198  --  208 194 234   Cardiac Enzymes:  Recent Labs Lab 10/04/13 0214  TROPONINI <0.30   BNP (last 3 results) No results found for this basename: PROBNP,  in the last 8760 hours CBG:  Recent Labs Lab 10/06/13 2135 10/07/13 0821 10/07/13 1146 10/07/13 1643 10/07/13 2053  GLUCAP 151* 188* 172* 155* 195*    Recent Results (from the past 240 hour(s))  CULTURE, BLOOD (ROUTINE X 2)     Status: None   Collection Time    10/04/13  2:14 AM      Result Value Ref Range Status   Specimen Description BLOOD LEFT ARM   Final   Special Requests BOTTLES DRAWN AEROBIC ONLY 9CCS   Final   Culture  Setup Time     Final   Value: 10/04/2013 13:30     Performed at [facility]   Culture     Final   Value:        BLOOD CULTURE RECEIVED NO GROWTH TO DATE CULTURE WILL BE HELD FOR 5 DAYS BEFORE ISSUING A  FINAL NEGATIVE REPORT     Note: Culture results may be compromised due to an excessive volume of blood received in culture bottles.     Performed at [facility]   Report Status PENDING   Incomplete  CULTURE, BLOOD (ROUTINE X 2)     Status: None   Collection Time    10/04/13  2:20 AM      Result Value Ref Range Status   Specimen Description BLOOD LEFT HAND   Final   Special Requests BOTTLES DRAWN AEROBIC ONLY 9CCS   Final   Culture  Setup Time     Final   Value: 10/04/2013 13:30     Performed at [facility]   Culture     Final   Value: STAPHYLOCOCCUS SPECIES (COAGULASE NEGATIVE)     Note: THE SIGNIFICANCE OF ISOLATING THIS ORGANISM FROM A SINGLE SET OF BLOOD CULTURES WHEN MULTIPLE SETS ARE DRAWN IS UNCERTAIN. PLEASE NOTIFY THE MICROBIOLOGY DEPARTMENT WITHIN ONE WEEK IF SPECIATION AND SENSITIVITIES ARE REQUIRED.     Note: Culture results may be compromised due to an excessive volume of blood received in culture bottles. Gram Stain Report Called to,Read Back By and Verified With: MORGAN SCHMITZ @ 1522 ON [id number] BY [facility]     Performed at [facility]   Report Status 10/08/2013 FINAL   Final  MRSA PCR SCREENING     Status: None   Collection Time    10/04/13  7:55 AM      Result Value Ref Range Status   MRSA by PCR NEGATIVE  NEGATIVE Final   Comment:            The GeneXpert MRSA Assay (FDA     approved for NASAL specimens     only), is one component of a     comprehensive MRSA colonization     surveillance program. It is not     intended to diagnose MRSA     infection nor to guide or     monitor treatment for     MRSA infections.  CLOSTRIDIUM DIFFICILE BY PCR     Status: None   Collection Time    10/04/13  2:42 PM      Result Value Ref Range Status   C difficile by pcr NEGATIVE  NEGATIVE Final  Studies: No results found.  Scheduled Meds: . allopurinol  150 mg Oral Daily  . aspirin EC  81 mg Oral Daily  . calcium carbonate  1 tablet Oral TID WC   . doxercalciferol  3 mcg Intravenous Q T,Th,Sa-HD  . gabapentin  100 mg Oral QHS  . insulin aspart  0-9 Units Subcutaneous TID WC  . midodrine  10 mg Oral TID WC  . multivitamin  1 tablet Oral Daily  . pneumococcal 13-valent conjugate vaccine  0.5 mL Intramuscular Tomorrow-1000  . sodium chloride  3 mL Intravenous Q12H  . sodium chloride  3 mL Intravenous Q12H  . vancomycin  1,000 mg Intravenous Q T,Th,Sa-HD  . Warfarin - Pharmacist Dosing Inpatient   Does not apply q1800   Continuous Infusions:   Time spent: 35 minutes  Shaquel Josephson  [facility] Pager [phone]. If 7PM-7AM, please contact night-coverage at [email or web address], password [facility] 10/08/2013, 11:53 AM  LOS: 4 days

## 2013-10-09 DIAGNOSIS — Z7901 Long term (current) use of anticoagulants: Secondary | ICD-10-CM

## 2013-10-09 LAB — GLUCOSE, CAPILLARY: GLUCOSE-CAPILLARY: 112 mg/dL — AB (ref 70–99)

## 2013-10-09 LAB — PROTIME-INR
INR: 2.3 — ABNORMAL HIGH (ref 0.00–1.49)
Prothrombin Time: 25.3 seconds — ABNORMAL HIGH (ref 11.6–15.2)

## 2013-10-09 NOTE — Discharge Summary (Signed)
Physician Discharge Summary  Alan [name] VHQ:[id number] DOB: 1931/06/21 DOA: 10/04/2013  PCP: Limmie Patricia, MD  Admit date: 10/04/2013 Discharge date: 10/09/2013  Time spent: 35 minutes  Recommendations for Outpatient Follow-up:  1. Home health 2. PT/INR on Wednesday  Discharge Diagnoses:  Principal Problem:   Gastroenteritis Active Problems:   Aortic stenosis   CAD (coronary artery disease)   End stage renal disease on dialysis   Chronic anticoagulation   Atrial fibrillation   Hx of BKA, bilateral   Sepsis   Acute encephalopathy   Elevated transaminase level   DM (diabetes mellitus), type 2 with renal complications   Bacteremia   Sinus tachycardia   Discharge Condition: improved  Diet recommendation: renal  Filed Weights   10/08/13 0657 10/08/13 1122 10/08/13 2207  Weight: 100.4 kg (221 lb 5.5 oz) 99.8 kg (220 lb 0.3 oz) 102.4 kg (225 lb 12 oz)    History of present illness:  Alan [name] is a 80 y.o. male with hx of afib on Coumadin, ESRD on HD (T, Thrs, Sat), hx of moderate AS (not a surgical candidate), known orthostatic symptomology on Midodrine, HTN, hyperlipidemia, bilateral amputations, received dialysis yesterday, who presents with nausea, vomiting, diarrhea and mild abdominal pain and altered mental status.  In yesterday morning, patient started having nausea, vomiting and mild abdominal pain. He vomited twice without blood in the vomitus. He also had diarrhea with large amount of watery stools for 2 times. At 11:00 AM, he went to do HD. His HD had to be stopped for a while because of blood pressure drop, but finally he could complete the course of HD.  He came home at 5:00 and felt very tired. He did not want to eat dinner as he usually did at about 5:00 to 6:00 PM per his wife. He went to sleep and had a nap for about 2 hours. After he woke up, he patient took his oral medications, including diabetic medication Amaryl, and had his sandwich  dinner at about 7:00PM. Then he watched TV together with his wife. At about 11:00 PM, he went to the bed, but he was confused, and could not position himself in a right position in the bed. Actually, he lay across his bed. His wife tried hard to position him in the bed by asking and helping him sit up. When he finally sit up, he sweat diffusely. He is more confused and stared straight in front. He did not fall or had any injury since he has been in bed all the time. He did not complain chest pain and shortness of breath. He did not have weakness, numbness, or tingling sensations in his extremities. No slurred speech, vision change or hearing loss. When I evaluated the patient, patient was alert. Per his wife, his mental status already came back to his baseline.  His wife called EMS and was brought to the emergency room for further evaluation and treatment. Patient was found to have low blood pressure at 89/34 mmHg, elevated lactic acid level 4.13, elevated transaminases, and fever in ED. He was admitted to the inpatient for further evaluation and treatment.    Hospital Course:  Gastroenteritis: c diff neg. GI pathogen panel negative. resolved  Sepsis: 1/2 staph species. repeat BC negative--- d/c vanc  Aortic stenosis  CAD (coronary artery disease)  End stage renal disease on dialysis t/th/sat Chronic anticoagulation: Coumadin per pharmacy  Paroxysmal Atrial fibrillation  Hx of BKA, bilateral. C/o weakness. PT eval recommend home health  Acute  encephalopathy resolved  Elevated transaminase level resolving. Likely shock liver. Hepatitis panel negative  DM 2 with renal complications: cont SSI  Resting Sinus tachycardia; Coumadin therapeutic. TSH ok. Per RN, periods of atrial fib. BP too low for beta blocker   Procedures:    Consultations:  renal  Discharge Exam: Filed Vitals:   10/09/13 0426  BP: 96/59  Pulse: 75  Temp: 98.1 F (36.7 C)  Resp: 17    General:  pleasant/cooperative Cardiovascular: rrr Respiratory: clear  Discharge Instructions You were cared for by a hospitalist during your hospital stay. If you have any questions about your discharge medications or the care you received while you were in the hospital after you are discharged, you can call the unit and asked to speak with the hospitalist on call if the hospitalist that took care of you is not available. Once you are discharged, your primary care physician will handle any further medical issues. Please note that NO REFILLS for any discharge medications will be authorized once you are discharged, as it is imperative that you return to your primary care physician (or establish a relationship with a primary care physician if you do not have one) for your aftercare needs so that they can reassess your need for medications and monitor your lab values.  Discharge Instructions   Discharge instructions    Complete by:  As directed   Home health PT/OT/RN/aide Continue HD as needed Renal diet     Increase activity slowly    Complete by:  As directed           Current Discharge Medication List    CONTINUE these medications which have NOT CHANGED   Details  allopurinol (ZYLOPRIM) 300 MG tablet Take 0.5 tablets (150 mg total) by mouth daily. Qty: 30 tablet, Refills: 1    aspirin EC 81 MG tablet Take 1 tablet (81 mg total) by mouth daily.    calcium carbonate (TUMS - DOSED IN MG ELEMENTAL CALCIUM) 500 MG chewable tablet Chew 1 tablet by mouth 3 (three) times daily with meals. Phosphorous binder    gabapentin (NEURONTIN) 100 MG capsule Take 100 mg by mouth at bedtime.    glimepiride (AMARYL) 4 MG tablet Take 2 mg by mouth daily before breakfast.    midodrine (PROAMATINE) 10 MG tablet Take 1 tablet (10 mg total) by mouth Every Tuesday,Thursday,and Saturday with dialysis.    multivitamin (RENA-VIT) TABS tablet Take 1 tablet by mouth daily.    simvastatin (ZOCOR) 40 MG tablet Take 20 mg  by mouth at bedtime.    warfarin (COUMADIN) 5 MG tablet Take 2.5-5 mg by mouth See admin instructions. Take 0.5 tablet on Wednesday. Take 1 tablet all other days.      STOP taking these medications     estazolam (PROSOM) 2 MG tablet        Allergies  Allergen Reactions  . Ancef [Cefazolin] Rash    Patient cant recall if allergy to this med exists.       The results of significant diagnostics from this hospitalization (including imaging, microbiology, ancillary and laboratory) are listed below for reference.    Significant Diagnostic Studies: Dg Chest Port 1 View  10/05/2013   CLINICAL DATA:  End-stage renal disease.  Kidney out.  EXAM: PORTABLE CHEST - 1 VIEW  COMPARISON:  10/04/2013.  FINDINGS: 0855 hrs. No pulmonary edema. Stable haziness at the left base may reflect chronic atelectasis or scarring. No focal airspace consolidation. No pneumothorax. The cardio pericardial silhouette is  enlarged. Telemetry leads overlie the chest.  IMPRESSION: Stable exam.  Cardiomegaly without acute cardiopulmonary findings.   Electronically Signed   By: Misty Stanley M.D.   On: 10/05/2013 09:08   Dg Chest Port 1 View  10/04/2013   CLINICAL DATA:  Near syncope.  Chills.  EXAM: PORTABLE CHEST - 1 VIEW  COMPARISON:  10/09/2012  FINDINGS: Postoperative changes in the mediastinum. Borderline heart size with normal pulmonary vascularity. Lungs are clear. No blunting of costophrenic angles. No pneumothorax. Tortuous aorta. No change since prior study.  IMPRESSION: No active disease.   Electronically Signed   By: Lucienne Capers M.D.   On: 10/04/2013 02:44   US Abdomen Limited Ruq  10/04/2013   CLINICAL DATA:  Near syncope. Fever. Dialysis patient. Elevated liver function studies.  EXAM: US ABDOMEN LIMITED - RIGHT UPPER QUADRANT  COMPARISON:  None.  FINDINGS: Gallbladder:  A few small stones are demonstrated in the dependent gallbladder. Mild gallbladder wall thickening at 5 mm. Murphy's sign is negative.   Common bile duct:  Diameter: Extrahepatic bile ducts measure up to a maximum about 12 mm diameter. Pancreatic duct is also dilated 2.9 mm. No obstructing mass or stone is demonstrated, but portions of the bowel duct are excluded by bowel gas. Consider further evaluation with elective MRI/MRCP.  Liver:  Mild diffuse increased echotexture suggesting fatty infiltration. No focal lesions identified.  IMPRESSION: Nonspecific findings in the gallbladder with cholelithiasis and mild gallbladder wall thickening. Extrahepatic bile duct dilatation and pancreatic ductal dilatation without cause identified. Consider follow-up with elective MRI/ MRCP.   Electronically Signed   By: Lucienne Capers M.D.   On: 10/04/2013 04:36    Microbiology: Recent Results (from the past 240 hour(s))  CULTURE, BLOOD (ROUTINE X 2)     Status: None   Collection Time    10/04/13  2:14 AM      Result Value Ref Range Status   Specimen Description BLOOD LEFT ARM   Final   Special Requests BOTTLES DRAWN AEROBIC ONLY 9CCS   Final   Culture  Setup Time     Final   Value: 10/04/2013 13:30     Performed at [facility]   Culture     Final   Value:        BLOOD CULTURE RECEIVED NO GROWTH TO DATE CULTURE WILL BE HELD FOR 5 DAYS BEFORE ISSUING A FINAL NEGATIVE REPORT     Note: Culture results may be compromised due to an excessive volume of blood received in culture bottles.     Performed at [facility]   Report Status PENDING   Incomplete  CULTURE, BLOOD (ROUTINE X 2)     Status: None   Collection Time    10/04/13  2:20 AM      Result Value Ref Range Status   Specimen Description BLOOD LEFT HAND   Final   Special Requests BOTTLES DRAWN AEROBIC ONLY 9CCS   Final   Culture  Setup Time     Final   Value: 10/04/2013 13:30     Performed at [facility]   Culture     Final   Value: STAPHYLOCOCCUS SPECIES (COAGULASE NEGATIVE)     Note: THE SIGNIFICANCE OF ISOLATING THIS ORGANISM FROM A SINGLE SET OF BLOOD  CULTURES WHEN MULTIPLE SETS ARE DRAWN IS UNCERTAIN. PLEASE NOTIFY THE MICROBIOLOGY DEPARTMENT WITHIN ONE WEEK IF SPECIATION AND SENSITIVITIES ARE REQUIRED.     Note: Culture results may be compromised due to an excessive  volume of blood received in culture bottles. Gram Stain Report Called to,Read Back By and Verified With: MORGAN SCHMITZ @ 1522 ON [id number] BY [facility]     Performed at [facility]   Report Status 10/08/2013 FINAL   Final  MRSA PCR SCREENING     Status: None   Collection Time    10/04/13  7:55 AM      Result Value Ref Range Status   MRSA by PCR NEGATIVE  NEGATIVE Final   Comment:            The GeneXpert MRSA Assay (FDA     approved for NASAL specimens     only), is one component of a     comprehensive MRSA colonization     surveillance program. It is not     intended to diagnose MRSA     infection nor to guide or     monitor treatment for     MRSA infections.  CLOSTRIDIUM DIFFICILE BY PCR     Status: None   Collection Time    10/04/13  2:42 PM      Result Value Ref Range Status   C difficile by pcr NEGATIVE  NEGATIVE Final  CULTURE, BLOOD (ROUTINE X 2)     Status: None   Collection Time    10/08/13  7:15 AM      Result Value Ref Range Status   Specimen Description BLOOD HEMODIALYSIS LINE   Final   Special Requests BOTTLES DRAWN AEROBIC AND ANAEROBIC 10MLS   Final   Culture  Setup Time     Final   Value: 10/08/2013 12:54     Performed at [facility]   Culture     Final   Value:        BLOOD CULTURE RECEIVED NO GROWTH TO DATE CULTURE WILL BE HELD FOR 5 DAYS BEFORE ISSUING A FINAL NEGATIVE REPORT     Performed at [facility]   Report Status PENDING   Incomplete  CULTURE, BLOOD (ROUTINE X 2)     Status: None   Collection Time    10/08/13  7:35 AM      Result Value Ref Range Status   Specimen Description BLOOD HEMODIALYSIS LINE   Final   Special Requests     Final   Value: BOTTLES DRAWN AEROBIC AND ANAEROBIC 10CC PATIENT ON FOLLOWING VANC    Culture  Setup Time     Final   Value: 10/08/2013 12:54     Performed at [facility]   Culture     Final   Value:        BLOOD CULTURE RECEIVED NO GROWTH TO DATE CULTURE WILL BE HELD FOR 5 DAYS BEFORE ISSUING A FINAL NEGATIVE REPORT     Performed at [facility]   Report Status PENDING   Incomplete     Labs: Basic Metabolic Panel:  Recent Labs Lab 10/04/13 0214 10/04/13 0229 10/05/13 0314 10/06/13 0249 10/07/13 0701 10/08/13 0715  NA 138 136* 133* 133* 134* 134*  K 3.8 3.7 4.2 3.9 4.2 4.1  CL 94* 98 91* 91* 92* 94*  CO2 25  --  24 22 22 25   GLUCOSE 214* 215* 103* 83 118* 147*  BUN 19 20 31* 26* 26* 37*  CREATININE 5.49* 5.60* 7.66* 6.40* 5.29* 7.22*  CALCIUM 9.0  --  9.3 9.0 8.8 9.3  PHOS  --   --   --  3.5  --   --  Liver Function Tests:  Recent Labs Lab 10/04/13 0214 10/05/13 0314 10/06/13 0249 10/07/13 0701 10/08/13 0715  AST 913* 262*  --  95* 79*  ALT 577* 285*  --  128* 109*  ALKPHOS 137* 108  --  96 97  BILITOT 2.0* 1.8*  --  0.7 0.6  PROT 7.7 7.3  --  7.3 7.1  ALBUMIN 3.7 3.2* 3.0* 2.9* 3.1*   No results found for this basename: LIPASE, AMYLASE,  in the last 168 hours No results found for this basename: AMMONIA,  in the last 168 hours CBC:  Recent Labs Lab 10/04/13 0214 10/04/13 0229 10/05/13 0314 10/06/13 1330 10/08/13 0715  WBC 8.7  --  8.6 7.0 7.1  NEUTROABS 8.0*  --   --   --   --   HGB 12.9* 15.0 12.4* 13.4 11.9*  HCT 41.0 44.0 38.5* 40.9 37.0*  MCV 94.0  --  91.4 93.2 90.5  PLT 198  --  208 194 234   Cardiac Enzymes:  Recent Labs Lab 10/04/13 0214  TROPONINI <0.30   BNP: BNP (last 3 results) No results found for this basename: PROBNP,  in the last 8760 hours CBG:  Recent Labs Lab 10/07/13 1643 10/07/13 2053 10/08/13 1202 10/08/13 1708 10/08/13 2206  GLUCAP 155* 195* 231* 135* 111*       Signed:  Hayze Gazda  [facility] 10/09/2013, 9:42 AM

## 2013-10-09 NOTE — Progress Notes (Signed)
Subjective:  No co / tolerated hd yesterday/ tolerated brk  Objective Vital signs in last 24 hours: Filed Vitals:   10/08/13 1204 10/08/13 1710 10/08/13 2207 10/09/13 0426  BP: 102/44 95/39 93/54  96/59  Pulse: 81 84 81 75  Temp: 97.7 F (36.5 C) 98.7 F (37.1 C) 98.1 F (36.7 C) 98.1 F (36.7 C)  TempSrc: Oral Oral Oral Oral  Resp: 21 19 18 17   Height:      Weight:   102.4 kg (225 lb 12 oz)   SpO2: 100% 100% 100% 97%   Weight change: 3.099 kg (6 lb 13.3 oz) Physical Exam:  General: Alert, NAD Heart: RRR, 2/6 sem lsb , no rub or gallop  Lungs: CTA bilat  Abdomen: obese, soft, NT, ND  Extremities: bilat BKA stumps no edema  Dialysis Access: pos. Bruit AVF   Dialysis prescription:  Tuesday, Thursday and Saturday. 4 hours 15 minutes, blood flow 500, dialysate 1.5 autoflow, 2K/2 calcium, UF profile 4, linear sodium modeling, Hectorol 3 mcg IV 3 times a week, heparin 3400 units bolus. Dry weight 101 kg   Problem/Plan:  1. Dyspnea: HD 2 consecutive day With volume excess on admit/ resolved now below edw by wts/ with wts variable with bed wts 2. Acute Gastroenteritis: Testing so far negative, resolved nausea/vomiting or diarrhea since admit  3. BC 1 of 2 Blood cultures Pos  Coag Neg Staph .on admit / on Vancomycin, reck 10/01 no growth so far 4 End-stage renal disease: NW TTS on schedule  / using Midodrine for bp support on hd  5. History of coronary artery disease and inoperable aortic stenosis: Continue to monitor closely and volume management as permitted by dialysis.  6 Metabolic bone disease: Resumed binders and vitamin D receptor analogs.  7. Anemia of chronic kidney disease: Hemoglobin currently> 11 and, no ESA  8  Dementia-Currently oriented X3/recoginizing me from HD Center/  When Pain meds Given occasionally ^ ams   Ernest Haber, PA-C [facility] [phone] 10/09/2013,8:10 AM  LOS: 5 days   Labs: Basic Metabolic Panel:  Recent Labs Lab  10/06/13 0249 10/07/13 0701 10/08/13 0715  NA 133* 134* 134*  K 3.9 4.2 4.1  CL 91* 92* 94*  CO2 22 22 25   GLUCOSE 83 118* 147*  BUN 26* 26* 37*  CREATININE 6.40* 5.29* 7.22*  CALCIUM 9.0 8.8 9.3  PHOS 3.5  --   --    Liver Function Tests:  Recent Labs Lab 10/05/13 0314 10/06/13 0249 10/07/13 0701 10/08/13 0715  AST 262*  --  95* 79*  ALT 285*  --  128* 109*  ALKPHOS 108  --  96 97  BILITOT 1.8*  --  0.7 0.6  PROT 7.3  --  7.3 7.1  ALBUMIN 3.2* 3.0* 2.9* 3.1*    CBC:  Recent Labs Lab 10/04/13 0214  10/05/13 0314 10/06/13 1330 10/08/13 0715  WBC 8.7  --  8.6 7.0 7.1  NEUTROABS 8.0*  --   --   --   --   HGB 12.9*  < > 12.4* 13.4 11.9*  HCT 41.0  < > 38.5* 40.9 37.0*  MCV 94.0  --  91.4 93.2 90.5  PLT 198  --  208 194 234  < > = values in this interval not displayed. Cardiac Enzymes:  Recent Labs Lab 10/04/13 0214  TROPONINI <0.30   CBG:  Recent Labs Lab 10/07/13 1643 10/07/13 2053 10/08/13 1202 10/08/13 1708 10/08/13 2206  GLUCAP 155* 195* 231* 135* 111*  Studies/Results: No results found. Medications:   . allopurinol  150 mg Oral Daily  . aspirin EC  81 mg Oral Daily  . calcium carbonate  1 tablet Oral TID WC  . doxercalciferol  3 mcg Intravenous Q T,Th,Sa-HD  . gabapentin  100 mg Oral QHS  . insulin aspart  0-9 Units Subcutaneous TID WC  . midodrine  10 mg Oral TID WC  . multivitamin  1 tablet Oral Daily  . pneumococcal 13-valent conjugate vaccine  0.5 mL Intramuscular Tomorrow-1000  . sodium chloride  3 mL Intravenous Q12H  . sodium chloride  3 mL Intravenous Q12H  . vancomycin  1,000 mg Intravenous Q T,Th,Sa-HD  . Warfarin - Pharmacist Dosing Inpatient   Does not apply [phone]

## 2013-10-09 NOTE — Progress Notes (Signed)
I have personally seen and examined this patient and agree with the assessment/plan as outlined above by [facility] PA. To be DC later today with lowered EDW. Deke Tilghman K.,MD 10/09/2013 10:28 AM

## 2013-10-09 NOTE — Progress Notes (Signed)
Discharge instructions reviewed with pt and wife; allowing time for questions. Pt and wife verbalized understanding. IV removed without issue. Tele bix removed and CCMD notified. Pt to leave unit via wheelchair.

## 2013-10-09 NOTE — Care Management Note (Addendum)
CARE MANAGEMENT NOTE 10/09/2013  Patient:  Alan [name], Alan [name]   Account Number:  [id number]  Date Initiated:  10/07/2013  Documentation initiated by:  Ronnette Rump  Subjective/Objective Assessment:   CM following for progression and d/c planning.     Action/Plan:   10/07/2013 Met with pt re d/c planning and IM given.  10/09/2013 met with pt and wife, who selected [facility] for HH needs, IM given.  CRoyal RN MPh   Anticipated DC Date:  10/09/2013   Anticipated DC Plan:  [place]         Choice offered to / List presented to:  C-3 Spouse        HH arranged  HH-1 RN  [place].   Status of service:  Completed, signed off Medicare Important Message given?  YES (If response is "NO", the following Medicare IM given date fields will be blank) Date Medicare IM given:  10/07/2013 Medicare IM given by:  Chapin Arduini Date Additional Medicare IM given:  10/09/2013 Additional Medicare IM given by:  [facility]  Discharge Disposition:  [place]  Per UR Regulation:    If discussed at Long Length of Stay Meetings, dates discussed:    Comments:   10/09/2013 Per pt wife, they have used [facility] previously and wish to use that agency again. Pt has DME, no further needs identified.  CRoyal RN MPH, case manager, [phone]

## 2013-10-10 LAB — CULTURE, BLOOD (ROUTINE X 2): CULTURE: NO GROWTH

## 2013-10-13 ENCOUNTER — Encounter (HOSPITAL_COMMUNITY): Payer: Self-pay | Admitting: Emergency Medicine

## 2013-10-13 ENCOUNTER — Inpatient Hospital Stay (HOSPITAL_COMMUNITY)
Admission: EM | Admit: 2013-10-13 | Discharge: 2013-11-08 | DRG: 070 | Disposition: E | Payer: MEDICARE | Attending: Pulmonary Disease | Admitting: Pulmonary Disease

## 2013-10-13 ENCOUNTER — Inpatient Hospital Stay (HOSPITAL_COMMUNITY): Payer: MEDICARE

## 2013-10-13 ENCOUNTER — Emergency Department (HOSPITAL_COMMUNITY): Payer: MEDICARE

## 2013-10-13 DIAGNOSIS — Z833 Family history of diabetes mellitus: Secondary | ICD-10-CM | POA: Diagnosis not present

## 2013-10-13 DIAGNOSIS — E669 Obesity, unspecified: Secondary | ICD-10-CM | POA: Diagnosis present

## 2013-10-13 DIAGNOSIS — I120 Hypertensive chronic kidney disease with stage 5 chronic kidney disease or end stage renal disease: Secondary | ICD-10-CM | POA: Diagnosis present

## 2013-10-13 DIAGNOSIS — M109 Gout, unspecified: Secondary | ICD-10-CM | POA: Diagnosis present

## 2013-10-13 DIAGNOSIS — Z89519 Acquired absence of unspecified leg below knee: Secondary | ICD-10-CM

## 2013-10-13 DIAGNOSIS — R404 Transient alteration of awareness: Secondary | ICD-10-CM | POA: Diagnosis present

## 2013-10-13 DIAGNOSIS — Z66 Do not resuscitate: Secondary | ICD-10-CM | POA: Diagnosis not present

## 2013-10-13 DIAGNOSIS — Z951 Presence of aortocoronary bypass graft: Secondary | ICD-10-CM | POA: Diagnosis not present

## 2013-10-13 DIAGNOSIS — R7989 Other specified abnormal findings of blood chemistry: Secondary | ICD-10-CM | POA: Diagnosis present

## 2013-10-13 DIAGNOSIS — Z7982 Long term (current) use of aspirin: Secondary | ICD-10-CM

## 2013-10-13 DIAGNOSIS — I959 Hypotension, unspecified: Secondary | ICD-10-CM | POA: Diagnosis present

## 2013-10-13 DIAGNOSIS — Z7901 Long term (current) use of anticoagulants: Secondary | ICD-10-CM | POA: Diagnosis not present

## 2013-10-13 DIAGNOSIS — Z89512 Acquired absence of left leg below knee: Secondary | ICD-10-CM | POA: Diagnosis not present

## 2013-10-13 DIAGNOSIS — I951 Orthostatic hypotension: Secondary | ICD-10-CM | POA: Diagnosis present

## 2013-10-13 DIAGNOSIS — E1140 Type 2 diabetes mellitus with diabetic neuropathy, unspecified: Secondary | ICD-10-CM | POA: Diagnosis present

## 2013-10-13 DIAGNOSIS — Z8249 Family history of ischemic heart disease and other diseases of the circulatory system: Secondary | ICD-10-CM | POA: Diagnosis not present

## 2013-10-13 DIAGNOSIS — R112 Nausea with vomiting, unspecified: Secondary | ICD-10-CM | POA: Diagnosis present

## 2013-10-13 DIAGNOSIS — I249 Acute ischemic heart disease, unspecified: Secondary | ICD-10-CM | POA: Diagnosis present

## 2013-10-13 DIAGNOSIS — E878 Other disorders of electrolyte and fluid balance, not elsewhere classified: Secondary | ICD-10-CM | POA: Diagnosis present

## 2013-10-13 DIAGNOSIS — M1990 Unspecified osteoarthritis, unspecified site: Secondary | ICD-10-CM | POA: Diagnosis present

## 2013-10-13 DIAGNOSIS — E785 Hyperlipidemia, unspecified: Secondary | ICD-10-CM | POA: Diagnosis present

## 2013-10-13 DIAGNOSIS — Z823 Family history of stroke: Secondary | ICD-10-CM

## 2013-10-13 DIAGNOSIS — I469 Cardiac arrest, cause unspecified: Secondary | ICD-10-CM | POA: Diagnosis not present

## 2013-10-13 DIAGNOSIS — I350 Nonrheumatic aortic (valve) stenosis: Secondary | ICD-10-CM

## 2013-10-13 DIAGNOSIS — N186 End stage renal disease: Secondary | ICD-10-CM

## 2013-10-13 DIAGNOSIS — R740 Nonspecific elevation of levels of transaminase and lactic acid dehydrogenase [LDH]: Secondary | ICD-10-CM | POA: Diagnosis present

## 2013-10-13 DIAGNOSIS — I4891 Unspecified atrial fibrillation: Secondary | ICD-10-CM | POA: Diagnosis present

## 2013-10-13 DIAGNOSIS — Z89511 Acquired absence of right leg below knee: Secondary | ICD-10-CM

## 2013-10-13 DIAGNOSIS — I4892 Unspecified atrial flutter: Secondary | ICD-10-CM | POA: Diagnosis present

## 2013-10-13 DIAGNOSIS — I2510 Atherosclerotic heart disease of native coronary artery without angina pectoris: Secondary | ICD-10-CM | POA: Diagnosis present

## 2013-10-13 DIAGNOSIS — Z87891 Personal history of nicotine dependence: Secondary | ICD-10-CM | POA: Diagnosis not present

## 2013-10-13 DIAGNOSIS — D638 Anemia in other chronic diseases classified elsewhere: Secondary | ICD-10-CM | POA: Diagnosis present

## 2013-10-13 DIAGNOSIS — E11319 Type 2 diabetes mellitus with unspecified diabetic retinopathy without macular edema: Secondary | ICD-10-CM | POA: Diagnosis present

## 2013-10-13 DIAGNOSIS — R778 Other specified abnormalities of plasma proteins: Secondary | ICD-10-CM | POA: Diagnosis present

## 2013-10-13 DIAGNOSIS — Z79899 Other long term (current) drug therapy: Secondary | ICD-10-CM

## 2013-10-13 DIAGNOSIS — Z8546 Personal history of malignant neoplasm of prostate: Secondary | ICD-10-CM | POA: Diagnosis not present

## 2013-10-13 DIAGNOSIS — Z992 Dependence on renal dialysis: Secondary | ICD-10-CM | POA: Diagnosis not present

## 2013-10-13 DIAGNOSIS — Z6841 Body Mass Index (BMI) 40.0 and over, adult: Secondary | ICD-10-CM

## 2013-10-13 DIAGNOSIS — R7401 Elevation of levels of liver transaminase levels: Secondary | ICD-10-CM | POA: Diagnosis present

## 2013-10-13 DIAGNOSIS — R000 Tachycardia, unspecified: Secondary | ICD-10-CM | POA: Diagnosis present

## 2013-10-13 DIAGNOSIS — G9340 Encephalopathy, unspecified: Secondary | ICD-10-CM | POA: Diagnosis present

## 2013-10-13 DIAGNOSIS — I482 Chronic atrial fibrillation, unspecified: Secondary | ICD-10-CM

## 2013-10-13 DIAGNOSIS — E1121 Type 2 diabetes mellitus with diabetic nephropathy: Secondary | ICD-10-CM

## 2013-10-13 DIAGNOSIS — E1122 Type 2 diabetes mellitus with diabetic chronic kidney disease: Secondary | ICD-10-CM

## 2013-10-13 DIAGNOSIS — Z89529 Acquired absence of unspecified knee: Secondary | ICD-10-CM

## 2013-10-13 DIAGNOSIS — I739 Peripheral vascular disease, unspecified: Secondary | ICD-10-CM | POA: Diagnosis present

## 2013-10-13 DIAGNOSIS — E1129 Type 2 diabetes mellitus with other diabetic kidney complication: Secondary | ICD-10-CM | POA: Diagnosis present

## 2013-10-13 LAB — COMPREHENSIVE METABOLIC PANEL
ALBUMIN: 3.5 g/dL (ref 3.5–5.2)
ALT: 174 U/L — AB (ref 0–53)
AST: 153 U/L — AB (ref 0–37)
Alkaline Phosphatase: 125 U/L — ABNORMAL HIGH (ref 39–117)
Anion gap: 16 — ABNORMAL HIGH (ref 5–15)
BUN: 18 mg/dL (ref 6–23)
CALCIUM: 8.8 mg/dL (ref 8.4–10.5)
CHLORIDE: 93 meq/L — AB (ref 96–112)
CO2: 26 mEq/L (ref 19–32)
CREATININE: 4.95 mg/dL — AB (ref 0.50–1.35)
GFR calc Af Amer: 12 mL/min — ABNORMAL LOW (ref >90)
GFR, EST NON AFRICAN AMERICAN: 10 mL/min — AB (ref >90)
Glucose, Bld: 211 mg/dL — ABNORMAL HIGH (ref 70–99)
Potassium: 5.2 mEq/L (ref 3.7–5.3)
Sodium: 135 mEq/L — ABNORMAL LOW (ref 137–147)
Total Bilirubin: 0.6 mg/dL (ref 0.3–1.2)
Total Protein: 8.3 g/dL (ref 6.0–8.3)

## 2013-10-13 LAB — I-STAT TROPONIN, ED: TROPONIN I, POC: 0.64 ng/mL — AB (ref 0.00–0.08)

## 2013-10-13 LAB — CBC WITH DIFFERENTIAL/PLATELET
BASOS ABS: 0 10*3/uL (ref 0.0–0.1)
BASOS PCT: 0 % (ref 0–1)
EOS PCT: 2 % (ref 0–5)
Eosinophils Absolute: 0.1 10*3/uL (ref 0.0–0.7)
HCT: 40.1 % (ref 39.0–52.0)
Hemoglobin: 13 g/dL (ref 13.0–17.0)
LYMPHS PCT: 26 % (ref 12–46)
Lymphs Abs: 2 10*3/uL (ref 0.7–4.0)
MCH: 30.2 pg (ref 26.0–34.0)
MCHC: 32.4 g/dL (ref 30.0–36.0)
MCV: 93.3 fL (ref 78.0–100.0)
Monocytes Absolute: 0.6 10*3/uL (ref 0.1–1.0)
Monocytes Relative: 8 % (ref 3–12)
NEUTROS ABS: 5 10*3/uL (ref 1.7–7.7)
Neutrophils Relative %: 64 % (ref 43–77)
Platelets: 365 10*3/uL (ref 150–400)
RBC: 4.3 MIL/uL (ref 4.22–5.81)
RDW: 14.2 % (ref 11.5–15.5)
WBC: 7.7 10*3/uL (ref 4.0–10.5)

## 2013-10-13 LAB — CBG MONITORING, ED: Glucose-Capillary: 180 mg/dL — ABNORMAL HIGH (ref 70–99)

## 2013-10-13 LAB — AMMONIA: AMMONIA: 38 umol/L (ref 11–60)

## 2013-10-13 LAB — GLUCOSE, CAPILLARY: Glucose-Capillary: 167 mg/dL — ABNORMAL HIGH (ref 70–99)

## 2013-10-13 LAB — I-STAT CG4 LACTIC ACID, ED: LACTIC ACID, VENOUS: 2.93 mmol/L — AB (ref 0.5–2.2)

## 2013-10-13 LAB — TROPONIN I: TROPONIN I: 0.41 ng/mL — AB (ref <0.30)

## 2013-10-13 MED ORDER — MIDODRINE HCL 5 MG PO TABS
10.0000 mg | ORAL_TABLET | Freq: Two times a day (BID) | ORAL | Status: DC
  Administered 2013-10-14: 10 mg via ORAL
  Filled 2013-10-13 (×4): qty 2

## 2013-10-13 MED ORDER — SODIUM CHLORIDE 0.9 % IJ SOLN
3.0000 mL | Freq: Two times a day (BID) | INTRAMUSCULAR | Status: DC
  Administered 2013-10-13: 3 mL via INTRAVENOUS

## 2013-10-13 MED ORDER — CALCIUM CARBONATE ANTACID 500 MG PO CHEW
1.0000 | CHEWABLE_TABLET | Freq: Three times a day (TID) | ORAL | Status: DC
  Filled 2013-10-13 (×4): qty 1

## 2013-10-13 MED ORDER — SODIUM CHLORIDE 0.9 % IV BOLUS (SEPSIS)
500.0000 mL | Freq: Once | INTRAVENOUS | Status: AC
  Administered 2013-10-13: 500 mL via INTRAVENOUS

## 2013-10-13 MED ORDER — ONDANSETRON HCL 4 MG/2ML IJ SOLN
4.0000 mg | Freq: Once | INTRAMUSCULAR | Status: AC
  Administered 2013-10-13: 4 mg via INTRAVENOUS
  Filled 2013-10-13: qty 2

## 2013-10-13 MED ORDER — ONDANSETRON HCL 4 MG/2ML IJ SOLN: 4.0000 mg | Freq: Four times a day (QID) | INTRAMUSCULAR | Status: DC | PRN

## 2013-10-13 MED ORDER — INSULIN ASPART 100 UNIT/ML ~~LOC~~ SOLN: 0.0000 [IU] | Freq: Three times a day (TID) | SUBCUTANEOUS | Status: DC

## 2013-10-13 MED ORDER — ASPIRIN EC 325 MG PO TBEC
325.0000 mg | DELAYED_RELEASE_TABLET | Freq: Once | ORAL | Status: AC
  Administered 2013-10-13: 325 mg via ORAL
  Filled 2013-10-13: qty 1

## 2013-10-13 MED ORDER — RENA-VITE PO TABS
1.0000 | ORAL_TABLET | Freq: Every day | ORAL | Status: DC
Start: 2013-10-14 — End: 2013-10-14
  Filled 2013-10-13: qty 1

## 2013-10-13 MED ORDER — ASPIRIN EC 325 MG PO TBEC
325.0000 mg | DELAYED_RELEASE_TABLET | Freq: Every day | ORAL | Status: DC
  Filled 2013-10-13: qty 1

## 2013-10-13 MED ORDER — SODIUM CHLORIDE 0.9 % IV SOLN
INTRAVENOUS | Status: DC
  Administered 2013-10-14: via INTRAVENOUS

## 2013-10-13 MED ORDER — ASPIRIN 81 MG PO CHEW
CHEWABLE_TABLET | ORAL | Status: AC
  Filled 2013-10-13: qty 4

## 2013-10-13 MED ORDER — ONDANSETRON HCL 4 MG PO TABS: 4.0000 mg | ORAL_TABLET | Freq: Four times a day (QID) | ORAL | Status: DC | PRN

## 2013-10-13 MED ORDER — ALLOPURINOL 150 MG HALF TABLET
150.0000 mg | ORAL_TABLET | Freq: Every day | ORAL | Status: DC
Start: 2013-10-14 — End: 2013-10-14
  Filled 2013-10-13: qty 1

## 2013-10-13 MED ORDER — GABAPENTIN 100 MG PO CAPS
100.0000 mg | ORAL_CAPSULE | Freq: Every day | ORAL | Status: DC
  Administered 2013-10-14: 100 mg via ORAL
  Filled 2013-10-13 (×2): qty 1

## 2013-10-13 MED ORDER — WARFARIN - PHARMACIST DOSING INPATIENT: Freq: Every day | Status: DC

## 2013-10-13 MED ORDER — INSULIN ASPART 100 UNIT/ML ~~LOC~~ SOLN: 0.0000 [IU] | Freq: Every day | SUBCUTANEOUS | Status: DC

## 2013-10-13 NOTE — ED Provider Notes (Signed)
CSN: [id number]     Arrival date & time 10/23/2013  1715 History   First MD Initiated Contact with Patient 10/10/2013 1720     Chief Complaint  Patient presents with  . Altered Mental Status     (Consider location/radiation/quality/duration/timing/severity/associated sxs/prior Treatment) HPI Comments: Level V Caveat as altered and cannot answer all questions  Patient is a 80 y.o. male presenting with altered mental status. The history is provided by the spouse. The history is limited by the condition of the patient.  Altered Mental Status Presenting symptoms: lethargy and partial responsiveness   Presenting symptoms: no unresponsiveness   Severity:  Mild Most recent episode: had an episode 1 week ago after HD which wife says was "exactly similar" and was admitted. States today occurred after HD. Episode history:  Continuous Duration:  20 minutes Timing:  Constant Progression:  Unchanged Chronicity:  Recurrent (hx of same last week where he was admitted here and dsicahrged with dx of gastroenteritis) Context: recent infection   Context: not alcohol use, not dementia and taking medications as prescribed   Associated symptoms comment:  Pt complained to wife about chest pain   Past Medical History  Diagnosis Date  . Diabetes mellitus   . Gout   . Hypertension   . Hyperlipidemia   . Arthritis   . Prostate cancer     a. s/p seed implant  . Cataract     a. s/p bilat cataract surgeries.  . Diabetic neuropathy   . Atrial fibrillation     a. chronic/rate controlled;  b. on coumadin  . Moderate aortic stenosis     a. 05/2012 Echo: EF 55-60%, mod AS, mildly dil LA - prev not felt to be a surgical candidate  . Anemia of chronic disease   . Coronary artery disease     a. remote CABG x 5 in 1996; b. 04/2003 Cath: LAD 100p, LCX min irregs, RCA 61m, VG->D1 nl, VG->AM->PDA->RPL nl, LIMA->LAD nl, EF 60%.  . Edema   . PAD (peripheral artery disease)     a. s/p bilat BKA's (L - 07/2011, R -  02/2012).  . Diabetic retinopathy   . Chronic anticoagulation     a. on coumadin  . End stage renal disease     a. Started dialysis October 2012  . Nephrolithiasis     a. prior history of percutaneous nephrostomy  . Orthostatic hypotension     a. on midodrine   Past Surgical History  Procedure Laterality Date  . Ventral hernia repair    . Inguinal hernia repair      right side  . Colonoscopy    . Kidney stone surgery    . Polypectomy    . Tonsillectomy    . Toe surgery      removal little toe right foot  . Cardiac catheterization  04/19/2003    SEVERE 2 VESSEL OBSTRUCTIVE ATHERSCLEROTIC CAD  . Coronary artery bypass graft  1996    LIMA GRAFT TO THE LAD, SEQUENTIAL SAPHENOUS  VEIN GRAFT TO THE FIRST AND SECOND DIAGONIAL BRANCHES, SEQUENTIAL SAPHENOUS VEIN GRAFT TO THE ACUTE MARGINAL, POSTERIOR DESCENDING, AND POSTERIOR LATERAL BRANCHES OF THE RIGHT CORONARY ARTERY  . Radioactive seed implant    . US echocardiography  09/06/2009    EF 55-60%  . Cardiovascular stress test  04/13/2003    EF 54%. EVIDENCE OF ANTERO-APICAL ISCHEMIA. NORMAL LV SIZE AND FUNCTION  . Tee without cardioversion  05/09/2011    Procedure: TRANSESOPHAGEAL ECHOCARDIOGRAM (TEE);  Surgeon: Kirk Ruths  Claris Gladden, MD;  Location: [place];  Service: Cardiovascular;  Laterality: N/A;  . Hernia repair    . Amputation  07/20/2011    Procedure: AMPUTATION BELOW KNEE;  Surgeon: Serafina Mitchell, MD;  Location: [place];  Service: Vascular;  Laterality: Left;  . Amputation  01/25/2012    Procedure: AMPUTATION DIGIT;  Surgeon: Angelia Mould, MD;  Location: [place];  Service: Vascular;  Laterality: Right;  second  . Av fistula placement      right  . Transmetatarsal amputation Right 02/15/2012    Procedure: TRANSMETATARSAL AMPUTATION;  Surgeon: Serafina Mitchell, MD;  Location: [facility] OR;  Service: Vascular;  Laterality: Right;  . Amputation Right 02/29/2012    Procedure: AMPUTATION BELOW KNEE;  Surgeon: Serafina Mitchell, MD;  Location:  [facility] OR;  Service: Vascular;  Laterality: Right;  Right BKA   Family History  Problem Relation Age of Onset  . Heart disease Father   . Hypertension Brother   . Stroke Brother   . Diabetes Mother     Pt. not sure, but he thinks she did.   History  Substance Use Topics  . Smoking status: Former Smoker -- 2 years    Types: Cigarettes    Quit date: 04/10/1968  . Smokeless tobacco: Former Systems developer    Types: Chew  . Alcohol Use: No    Review of Systems  Unable to perform ROS: Mental status change      Allergies  Ancef  Home Medications   Prior to Admission medications   Medication Sig Start Date End Date Taking? Authorizing Provider  allopurinol (ZYLOPRIM) 300 MG tablet Take 0.5 tablets (150 mg total) by mouth daily. 02/06/12  Yes Daniel J Angiulli, PA-C  aspirin EC 81 MG tablet Take 1 tablet (81 mg total) by mouth daily. 02/06/12  Yes Daniel J Angiulli, PA-C  calcium carbonate (TUMS - DOSED IN MG ELEMENTAL CALCIUM) 500 MG chewable tablet Chew 1 tablet by mouth 3 (three) times daily with meals. Phosphorous binder   Yes Historical Provider, MD  gabapentin (NEURONTIN) 100 MG capsule Take 100 mg by mouth at bedtime.   Yes Historical Provider, MD  glimepiride (AMARYL) 4 MG tablet Take 2 mg by mouth daily before breakfast.   Yes Historical Provider, MD  midodrine (PROAMATINE) 10 MG tablet Take 1 tablet (10 mg total) by mouth Every Tuesday,Thursday,and Saturday with dialysis. 10/15/12  Yes Annita Brod, MD  multivitamin (RENA-VIT) TABS tablet Take 1 tablet by mouth daily.   Yes Historical Provider, MD  simvastatin (ZOCOR) 40 MG tablet Take 20 mg by mouth at bedtime.   Yes Historical Provider, MD  warfarin (COUMADIN) 5 MG tablet Take 2.5-5 mg by mouth See admin instructions. Take 0.5 tablet on Wednesday. Take 1 tablet all other days.   Yes Historical Provider, MD   BP 108/46  Pulse 117  Temp(Src) 97.9 F (36.6 C) (Rectal)  Resp 17  SpO2 99% Physical Exam  Vitals  reviewed. Constitutional: He appears well-developed and well-nourished. No distress.  Pt somnolent, but wakes upw ith touch and responds to questions appropriately  HENT:  Head: Normocephalic and atraumatic.  Mouth/Throat: Oropharynx is clear and moist. No oropharyngeal exudate.  No signs of head trauma  Eyes: Conjunctivae and EOM are normal. Pupils are equal, round, and reactive to light. Right eye exhibits no discharge. Left eye exhibits no discharge. No scleral icterus.  Neck: Normal range of motion. Neck supple.  Cardiovascular: Normal rate, regular rhythm, normal heart sounds and intact  distal pulses.  Exam reveals no gallop and no friction rub.   No murmur heard. Pulmonary/Chest: Effort normal and breath sounds normal. No respiratory distress. He has no wheezes. He has no rales.  Abdominal: Soft. He exhibits no distension and no mass. There is no tenderness.  Musculoskeletal: Normal range of motion.  Neurological: No cranial nerve deficit. He exhibits normal muscle tone. Coordination normal.  Wakes up and can answer basic questions, but lethargic  Skin: Skin is warm. No rash noted. He is diaphoretic (mild-moderate).    ED Course  Procedures (including critical care time) Labs Review Labs Reviewed  COMPREHENSIVE METABOLIC PANEL - Abnormal; Notable for the following:    Sodium 135 (*)    Chloride 93 (*)    Glucose, Bld 211 (*)    Creatinine, Ser 4.95 (*)    AST 153 (*)    ALT 174 (*)    Alkaline Phosphatase 125 (*)    GFR calc non Af Amer 10 (*)    GFR calc Af Amer 12 (*)    Anion gap 16 (*)    All other components within normal limits  TROPONIN I - Abnormal; Notable for the following:    Troponin I 0.41 (*)    All other components within normal limits  I-STAT CG4 LACTIC ACID, ED - Abnormal; Notable for the following:    Lactic Acid, Venous 2.93 (*)    All other components within normal limits  I-STAT TROPOININ, ED - Abnormal; Notable for the following:    Troponin i,  poc 0.64 (*)    All other components within normal limits  CBG MONITORING, ED - Abnormal; Notable for the following:    Glucose-Capillary 180 (*)    All other components within normal limits  CULTURE, BLOOD (ROUTINE X 2)  CULTURE, BLOOD (ROUTINE X 2)  CBC WITH DIFFERENTIAL  AMMONIA  PROTIME-INR    Imaging Review Ct Head Wo Contrast  10/08/2013   CLINICAL DATA:  Initial encounter for altered mental status following dialysis. Confusion.  EXAM: CT HEAD WITHOUT CONTRAST  TECHNIQUE: Contiguous axial images were obtained from the base of the skull through the vertex without intravenous contrast.  COMPARISON:  None.  FINDINGS: Mild atrophy and white matter disease is present bilaterally. No acute cortical infarct or hemorrhage is present. Remote infarcts are present in the cerebellum bilaterally. The ventricles are proportionate to the degree of atrophy. No significant extra-axial fluid collection is present.  The paranasal sinuses and mastoid air cells are clear. The osseous skull is intact.  IMPRESSION: 1. Moderate generalized atrophy and white matter disease. This is nonspecific, but likely reflects the sequela of chronic microvascular ischemia. 2. No acute intracranial abnormality.   Electronically Signed   By: Lawrence Santiago M.D.   On: 10/12/2013 22:13   Dg Chest Portable 1 View  10/21/2013   CLINICAL DATA:  Altered mental status. Diaphoretic after dialysis today.  EXAM: PORTABLE CHEST - 1 VIEW  COMPARISON:  10/05/2013 and 01/23/2012  FINDINGS: There is stable mild cardiomegaly. Prior CABG. Pulmonary vascularity is normal and the lungs are clear. No acute osseous abnormality.  IMPRESSION: No acute disease.  Stable chronic cardiomegaly.   Electronically Signed   By: Rozetta Nunnery M.D.   On: 10/25/2013 19:37     EKG Interpretation   Date/Time:  Tuesday October 13 2013 17:33:14 EDT Ventricular Rate:  115 PR Interval:  104 QRS Duration: 107 QT Interval:  357 QTC Calculation: 494 R Axis:    170 Text Interpretation:  Sinus tachycardia Anterior infarct, old Repol abnrm,  severe global ischemia (LM/MVD) Confirmed by HARRISON  MD, FORREST ([id number])  on 11/07/2013 8:56:33 PM      MDM   MDM: Patient is a 80 year old male who comes in altered mental status after dialysis. He had a regular session dialysis today and after was confused. His history is limited as he is altered and his wife gives history. His wife said that one week ago he takes acting presentation and was admitted to the hospital. I reviewed this discharge summary which shows gastroenteritis however he did have one positive blood culture which grew out staph which could have been contaminated on initial arrival he is partially responsive to protect his airway. He is hypotensive but he runs hypotensive normally. His EKG shows some questionable lateral ischemic change and no STEMI. He was given 500 cc of fluid in his mental status improved. He complained of chest pain. Chest x-ray labs were checked as well as cultures drawn. Due to his chronic hypotension we didn't feel antibiotics were immediately indicated. Did have a slightly elevated lactate that could be from hypoperfusion from dialysis. Placate troponin was elevated and formal troponin had mild elevation as well. Once again not sure if NSTEMI versus end-stage renal. Patient is high risk for bacteremia and with scattered laboratory abnormalities we feel it is best to admit this patient. Patient's mental status and hemodynamics improved while in ED. Admit to Hospitalist.   Final diagnoses:  Transient alteration of awareness    Admit   Sol Passer, MD 10/13/2013 2219

## 2013-10-13 NOTE — ED Notes (Signed)
Pulled from car by staff, dialysis today, is altered, diaphoretic, and confused on arrival, brought to trauma C with RN and MD to bedside

## 2013-10-13 NOTE — ED Notes (Signed)
CG-4 and trop reported to Dr. Maudry Mayhew

## 2013-10-13 NOTE — Progress Notes (Signed)
ANTICOAGULATION CONSULT NOTE - Initial Consult  Pharmacy Consult for Coumadin Indication: atrial fibrillation  Allergies  Allergen Reactions  . Ancef [Cefazolin] Rash    Patient cant recall if allergy to this med exists.     Patient Measurements: Height: 5' (152.4 cm) (after amputies) Weight: 218 lb (98.884 kg) IBW/kg (Calculated) : 50  Vital Signs: Temp: 97.6 F (36.4 C) (10/06 2314) Temp Source: Oral (10/06 2314) BP: 104/47 mmHg (10/06 2314) Pulse Rate: 114 (10/06 2314)  Labs:  Recent Labs  10/09/2013 1724 10/09/2013 1743  HGB 13.0  --   HCT 40.1  --   PLT 365  --   CREATININE 4.95*  --   TROPONINI  --  0.41*    Estimated Creatinine Clearance: 11.9 ml/min (by C-G formula based on Cr of 4.95).   Medical History: Past Medical History  Diagnosis Date  . Diabetes mellitus   . Gout   . Hypertension   . Hyperlipidemia   . Arthritis   . Prostate cancer     a. s/p seed implant  . Cataract     a. s/p bilat cataract surgeries.  . Diabetic neuropathy   . Atrial fibrillation     a. chronic/rate controlled;  b. on coumadin  . Moderate aortic stenosis     a. 05/2012 Echo: EF 55-60%, mod AS, mildly dil LA - prev not felt to be a surgical candidate  . Anemia of chronic disease   . Coronary artery disease     a. remote CABG x 5 in 1996; b. 04/2003 Cath: LAD 100p, LCX min irregs, RCA 65m, VG->D1 nl, VG->AM->PDA->RPL nl, LIMA->LAD nl, EF 60%.  . Edema   . PAD (peripheral artery disease)     a. s/p bilat BKA's (L - 07/2011, R - 02/2012).  . Diabetic retinopathy   . Chronic anticoagulation     a. on coumadin  . End stage renal disease     a. Started dialysis October 2012  . Nephrolithiasis     a. prior history of percutaneous nephrostomy  . Orthostatic hypotension     a. on midodrine    Medications:  Prescriptions prior to admission  Medication Sig Dispense Refill  . allopurinol (ZYLOPRIM) 300 MG tablet Take 0.5 tablets (150 mg total) by mouth daily.  30 tablet  1  .  aspirin EC 81 MG tablet Take 1 tablet (81 mg total) by mouth daily.      . calcium carbonate (TUMS - DOSED IN MG ELEMENTAL CALCIUM) 500 MG chewable tablet Chew 1 tablet by mouth 3 (three) times daily with meals. Phosphorous binder      . gabapentin (NEURONTIN) 100 MG capsule Take 100 mg by mouth at bedtime.      [name] glimepiride (AMARYL) 4 MG tablet Take 2 mg by mouth daily before breakfast.      . midodrine (PROAMATINE) 10 MG tablet Take 1 tablet (10 mg total) by mouth Every Tuesday,Thursday,and Saturday with dialysis.      [name] multivitamin (RENA-VIT) TABS tablet Take 1 tablet by mouth daily.      . simvastatin (ZOCOR) 40 MG tablet Take 20 mg by mouth at bedtime.      [name] warfarin (COUMADIN) 5 MG tablet Take 2.5-5 mg by mouth See admin instructions. Take 0.5 tablet on Wednesday. Take 1 tablet all other days.        Assessment: 80 yo male admitted with AMS, h/o Afib, to continue Coumadin  Goal of Therapy:  INR 2-3 Monitor platelets by anticoagulation  protocol: Yes   Plan:  Daily INR  Mauriana Dann, Bronson Curb 10/09/2013,11:28 PM

## 2013-10-13 NOTE — ED Notes (Signed)
Pt's wife, Reign Bartnick, [phone]

## 2013-10-13 NOTE — ED Notes (Signed)
CBG is 180. Notified Nurse Suezanne Jacquet.

## 2013-10-13 NOTE — H&P (Addendum)
[facility] History and Physical  Patient: Alan [name]  DGU:[id number]  DOB: 01-13-1931  DOS: the patient was seen and examined on 11/02/2013 PCP: Limmie Patricia, MD  Chief Complaint: Confusion and generalized weakness  HPI: Alan [name] is a 80 y.o. male with Past medical history of ESRD on hemodialysis Tuesday Thursday Saturday, hypertension, diabetes, prostate cancer, A. fib on Coumadin, morbid AST, orthostatic hypotension, bilateral amputation due to peripheral vascular disease. The patient presented with complaints of generalized weakness that started after the dialysis. Patient mentions that he was fine when he went for the hemodialysis and did not have any complaints but after the dialysis he started having progressively worsening generalized weakness. While he was in the car, he has to be helped out of  the car. he was significantly confused and initially was obtunded. When patient was more awake he started complains of substernal chest pain which he described as a burning pain. He also had an episode of nausea and vomiting at the same time. He denies any diarrhea. He denies any changes in her medication. He mentions he has been on hemodialysis for last 3 years.  At the time of my evaluation there was significant improvement in his mental status and he was able to time and that he did not have any nausea, chest pain, shortness of breath. No fever no chills no diarrhea no constipation.  The patient is coming from home. And at his baseline independent for most of his ADL.  Review of Systems: as mentioned in the history of present illness.  A Comprehensive review of the other systems is negative.  Past Medical History  Diagnosis Date  . Diabetes mellitus   . Gout   . Hypertension   . Hyperlipidemia   . Arthritis   . Prostate cancer     a. s/p seed implant  . Cataract     a. s/p bilat cataract surgeries.  . Diabetic neuropathy   . Atrial fibrillation     a.  chronic/rate controlled;  b. on coumadin  . Moderate aortic stenosis     a. 05/2012 Echo: EF 55-60%, mod AS, mildly dil LA - prev not felt to be a surgical candidate  . Anemia of chronic disease   . Coronary artery disease     a. remote CABG x 5 in 1996; b. 04/2003 Cath: LAD 100p, LCX min irregs, RCA 63m, VG->D1 nl, VG->AM->PDA->RPL nl, LIMA->LAD nl, EF 60%.  . Edema   . PAD (peripheral artery disease)     a. s/p bilat BKA's (L - 07/2011, R - 02/2012).  . Diabetic retinopathy   . Chronic anticoagulation     a. on coumadin  . End stage renal disease     a. Started dialysis October 2012  . Nephrolithiasis     a. prior history of percutaneous nephrostomy  . Orthostatic hypotension     a. on midodrine   Past Surgical History  Procedure Laterality Date  . Ventral hernia repair    . Inguinal hernia repair      right side  . Colonoscopy    . Kidney stone surgery    . Polypectomy    . Tonsillectomy    . Toe surgery      removal little toe right foot  . Cardiac catheterization  04/19/2003    SEVERE 2 VESSEL OBSTRUCTIVE ATHERSCLEROTIC CAD  . Coronary artery bypass graft  1996    LIMA GRAFT TO THE LAD, SEQUENTIAL SAPHENOUS  VEIN GRAFT TO THE  FIRST AND SECOND DIAGONIAL BRANCHES, SEQUENTIAL SAPHENOUS VEIN GRAFT TO THE ACUTE MARGINAL, POSTERIOR DESCENDING, AND POSTERIOR LATERAL BRANCHES OF THE RIGHT CORONARY ARTERY  . Radioactive seed implant    . US echocardiography  09/06/2009    EF 55-60%  . Cardiovascular stress test  04/13/2003    EF 54%. EVIDENCE OF ANTERO-APICAL ISCHEMIA. NORMAL LV SIZE AND FUNCTION  . Tee without cardioversion  05/09/2011    Procedure: TRANSESOPHAGEAL ECHOCARDIOGRAM (TEE);  Surgeon: Larey Dresser, MD;  Location: [place];  Service: Cardiovascular;  Laterality: N/A;  . Hernia repair    . Amputation  07/20/2011    Procedure: AMPUTATION BELOW KNEE;  Surgeon: Serafina Mitchell, MD;  Location: [place];  Service: Vascular;  Laterality: Left;  . Amputation  01/25/2012     Procedure: AMPUTATION DIGIT;  Surgeon: Angelia Mould, MD;  Location: [place];  Service: Vascular;  Laterality: Right;  second  . Av fistula placement      right  . Transmetatarsal amputation Right 02/15/2012    Procedure: TRANSMETATARSAL AMPUTATION;  Surgeon: Serafina Mitchell, MD;  Location: [facility] OR;  Service: Vascular;  Laterality: Right;  . Amputation Right 02/29/2012    Procedure: AMPUTATION BELOW KNEE;  Surgeon: Serafina Mitchell, MD;  Location: [facility] OR;  Service: Vascular;  Laterality: Right;  Right BKA   Social History:  reports that he quit smoking about 45 years ago. His smoking use included Cigarettes. He smoked 0.00 packs per day for 2 years. He has quit using smokeless tobacco. His smokeless tobacco use included Chew. He reports that he does not drink alcohol or use illicit drugs.  Allergies  Allergen Reactions  . Ancef [Cefazolin] Rash    Patient cant recall if allergy to this med exists.     Family History  Problem Relation Age of Onset  . Heart disease Father   . Hypertension Brother   . Stroke Brother   . Diabetes Mother     Pt. not sure, but he thinks she did.    Prior to Admission medications   Medication Sig Start Date End Date Taking? Authorizing Provider  allopurinol (ZYLOPRIM) 300 MG tablet Take 0.5 tablets (150 mg total) by mouth daily. 02/06/12  Yes Daniel J Angiulli, PA-C  aspirin EC 81 MG tablet Take 1 tablet (81 mg total) by mouth daily. 02/06/12  Yes Daniel J Angiulli, PA-C  calcium carbonate (TUMS - DOSED IN MG ELEMENTAL CALCIUM) 500 MG chewable tablet Chew 1 tablet by mouth 3 (three) times daily with meals. Phosphorous binder   Yes Historical Provider, MD  gabapentin (NEURONTIN) 100 MG capsule Take 100 mg by mouth at bedtime.   Yes Historical Provider, MD  glimepiride (AMARYL) 4 MG tablet Take 2 mg by mouth daily before breakfast.   Yes Historical Provider, MD  midodrine (PROAMATINE) 10 MG tablet Take 1 tablet (10 mg total) by mouth Every Tuesday,Thursday,and  Saturday with dialysis. 10/15/12  Yes Annita Brod, MD  multivitamin (RENA-VIT) TABS tablet Take 1 tablet by mouth daily.   Yes Historical Provider, MD  simvastatin (ZOCOR) 40 MG tablet Take 20 mg by mouth at bedtime.   Yes Historical Provider, MD  warfarin (COUMADIN) 5 MG tablet Take 2.5-5 mg by mouth See admin instructions. Take 0.5 tablet on Wednesday. Take 1 tablet all other days.   Yes Historical Provider, MD    Physical Exam: Filed Vitals:   10/12/2013 2215 10/10/2013 2232 10/09/2013 2246 10/28/2013 2314  BP: 102/39 90/46  104/47  Pulse:  114  Temp:   98.4 F (36.9 C) 97.6 F (36.4 C)  TempSrc:   Rectal Oral  Resp: 20 19 19 18   Height:    5' (1.524 m)  Weight:    98.884 kg (218 lb)  SpO2:        General: Alert, Awake and Oriented to Time, Place and Person. Appear in mild distress Eyes: PERRL ENT: Oral Mucosa clear moist. Neck:  No JVD Cardiovascular: S1 and S2 Present,  aortic systolic Murmur, Peripheral Pulses Present Respiratory: Bilateral Air entry equal and Decreased, no Crackles,  bilateral wheezing wheezes Abdomen: Bowel Sound  present, Soft and  no tender Skin:  No Rash Extremities: bilateral BKA  Neurologic: Grossly no focal neuro deficit.  Labs on Admission:  CBC:  Recent Labs Lab 10/08/13 0715 10/16/2013 1724  WBC 7.1 7.7  NEUTROABS  --  5.0  HGB 11.9* 13.0  HCT 37.0* 40.1  MCV 90.5 93.3  PLT 234 365    CMP     Component Value Date/Time   NA 135* 10/27/2013 1724   K 5.2 10/11/2013 1724   CL 93* 10/12/2013 1724   CO2 26 10/09/2013 1724   GLUCOSE 211* 10/20/2013 1724   BUN 18 10/18/2013 1724   CREATININE 4.95* 10/09/2013 1724   CALCIUM 8.8 11/07/2013 1724   CALCIUM 8.8 10/27/2010 0734   PROT 8.3 10/24/2013 1724   ALBUMIN 3.5 11/02/2013 1724   AST 153* 11/06/2013 1724   ALT 174* 10/17/2013 1724   ALKPHOS 125* 10/30/2013 1724   BILITOT 0.6 10/28/2013 1724   GFRNONAA 10* 10/27/2013 1724   GFRAA 12* 11/05/2013 1724    No results found for this basename:  LIPASE, AMYLASE,  in the last 168 hours  Recent Labs Lab 10/22/2013 1836  AMMONIA 38     Recent Labs Lab 10/20/2013 1743  TROPONINI 0.41*   BNP (last 3 results) No results found for this basename: PROBNP,  in the last 8760 hours  Radiological Exams on Admission: Ct Head Wo Contrast  11/02/2013   CLINICAL DATA:  Initial encounter for altered mental status following dialysis. Confusion.  EXAM: CT HEAD WITHOUT CONTRAST  TECHNIQUE: Contiguous axial images were obtained from the base of the skull through the vertex without intravenous contrast.  COMPARISON:  None.  FINDINGS: Mild atrophy and white matter disease is present bilaterally. No acute cortical infarct or hemorrhage is present. Remote infarcts are present in the cerebellum bilaterally. The ventricles are proportionate to the degree of atrophy. No significant extra-axial fluid collection is present.  The paranasal sinuses and mastoid air cells are clear. The osseous skull is intact.  IMPRESSION: 1. Moderate generalized atrophy and white matter disease. This is nonspecific, but likely reflects the sequela of chronic microvascular ischemia. 2. No acute intracranial abnormality.   Electronically Signed   By: Lawrence Santiago M.D.   On: 10/27/2013 22:13   Dg Chest Portable 1 View  10/13/2013   CLINICAL DATA:  Altered mental status. Diaphoretic after dialysis today.  EXAM: PORTABLE CHEST - 1 VIEW  COMPARISON:  10/05/2013 and 01/23/2012  FINDINGS: There is stable mild cardiomegaly. Prior CABG. Pulmonary vascularity is normal and the lungs are clear. No acute osseous abnormality.  IMPRESSION: No acute disease.  Stable chronic cardiomegaly.   Electronically Signed   By: Rozetta Nunnery M.D.   On: 10/13/2013 19:37    EKG: Independently reviewed. sinus tachycardia.  Assessment/Plan Principal Problem:   suspected Dialysis disequilibrium syndrome Active Problems:   CAD (coronary artery disease)  End stage renal disease on dialysis   Chronic  anticoagulation   Hypotension   Atrial fibrillation   Hx of BKA, bilateral   Acute encephalopathy   Elevated transaminase level   DM (diabetes mellitus), type 2 with renal complications   Sinus tachycardia   1. Dialysis disequilibrium syndrome  the patient is presenting with complaints of altered mental status, nausea, vomiting, hypotension started after dialysis. He had similar episode one week ago which was also associated with dialysis. Possibility the patient could have dialysis disequilibrium syndrome or any acute encephalopathy secondary to any toxic metabolic abnormality. CT of the head is negative for any acute abnormality. His initial blood work shows elevated lactic acid, elevated troponin, elevated LFT, with no leukocytosis, and a normal chest x-ray with EKG showing sinus tachycardia. He was also hypothermic initially. During his last hospitalization he was treated as suspected sepsis with similar presentation. At present possibility of his current presentation is multifactorial but his rapid improvement and association with hemodialysis appears to point hours dialysis disequilibrium syndrome. Will discuss with nephrology in the morning for further input. At present I would also increase his midodrine for twice a day. I would hydrate him with IV fluids overnight.  2. elevated troponin. Patient appears to have elevated troponin with sinus tachycardia. He was initially hypothermic and hypotensive. He generally has low blood pressure. As discussed with cardiology since the patient does not have any chest pain and he is oriented related recommended to continue with aspirin and obtain CK-MB as well as CK and further workup depending on the elevation of CK-MB. At present I would continue with aspirin 325, warfarin and obtain an echocardiogram patient will remain n.p.o. after midnight. Monitor the patient on telemetry.  3. A. fib Anticoagulation Continue warfarin monitor on  telemetry. He's not on any rate controlling medication due to his hypotension.  4. diabetes mellitus. Placing the patient on sliding scale at present.  Advance goals of care discussion:  Full   Consults:  Phone call with cardiology  DVT Prophylaxis: on chronic anticoagulation Nutrition:  Renal diet, n.p.o. later on  Disposition: Admitted to inpatient in step-down unit.  Author: Berle Mull, MD [facility] Pager: [phone] 10/19/2013, 11:18 PM    If 7PM-7AM, please contact night-coverage [email or web address] Password [email or web address]

## 2013-10-14 ENCOUNTER — Inpatient Hospital Stay (HOSPITAL_COMMUNITY): Payer: MEDICARE

## 2013-10-14 ENCOUNTER — Ambulatory Visit (HOSPITAL_COMMUNITY): Payer: MEDICARE

## 2013-10-14 DIAGNOSIS — R404 Transient alteration of awareness: Secondary | ICD-10-CM

## 2013-10-14 DIAGNOSIS — R7989 Other specified abnormal findings of blood chemistry: Secondary | ICD-10-CM

## 2013-10-14 DIAGNOSIS — N189 Chronic kidney disease, unspecified: Secondary | ICD-10-CM

## 2013-10-14 DIAGNOSIS — I469 Cardiac arrest, cause unspecified: Secondary | ICD-10-CM

## 2013-10-14 DIAGNOSIS — E1122 Type 2 diabetes mellitus with diabetic chronic kidney disease: Secondary | ICD-10-CM

## 2013-10-14 DIAGNOSIS — R778 Other specified abnormalities of plasma proteins: Secondary | ICD-10-CM | POA: Diagnosis present

## 2013-10-14 LAB — COMPREHENSIVE METABOLIC PANEL
ALT: 141 U/L — AB (ref 0–53)
ALT: 166 U/L — AB (ref 0–53)
AST: 95 U/L — AB (ref 0–37)
AST: 130 U/L — AB (ref 0–37)
Albumin: 3.3 g/dL — ABNORMAL LOW (ref 3.5–5.2)
Albumin: 3.3 g/dL — ABNORMAL LOW (ref 3.5–5.2)
Alkaline Phosphatase: 118 U/L — ABNORMAL HIGH (ref 39–117)
Alkaline Phosphatase: 118 U/L — ABNORMAL HIGH (ref 39–117)
Anion gap: 21 — ABNORMAL HIGH (ref 5–15)
Anion gap: 23 — ABNORMAL HIGH (ref 5–15)
BUN: 24 mg/dL — ABNORMAL HIGH (ref 6–23)
BUN: 26 mg/dL — ABNORMAL HIGH (ref 6–23)
CALCIUM: 8.9 mg/dL (ref 8.4–10.5)
CO2: 20 meq/L (ref 19–32)
CO2: 17 meq/L — AB (ref 19–32)
Calcium: 8.7 mg/dL (ref 8.4–10.5)
Chloride: 95 mEq/L — ABNORMAL LOW (ref 96–112)
Chloride: 96 mEq/L (ref 96–112)
Creatinine, Ser: 6 mg/dL — ABNORMAL HIGH (ref 0.50–1.35)
Creatinine, Ser: 6.16 mg/dL — ABNORMAL HIGH (ref 0.50–1.35)
GFR calc Af Amer: 9 mL/min — ABNORMAL LOW (ref >90)
GFR calc Af Amer: 9 mL/min — ABNORMAL LOW (ref >90)
GFR calc non Af Amer: 8 mL/min — ABNORMAL LOW (ref >90)
GFR, EST NON AFRICAN AMERICAN: 8 mL/min — AB (ref >90)
GLUCOSE: 218 mg/dL — AB (ref 70–99)
Glucose, Bld: 237 mg/dL — ABNORMAL HIGH (ref 70–99)
POTASSIUM: 4.6 meq/L (ref 3.7–5.3)
POTASSIUM: 5.5 meq/L — AB (ref 3.7–5.3)
SODIUM: 136 meq/L — AB (ref 137–147)
SODIUM: 136 meq/L — AB (ref 137–147)
TOTAL PROTEIN: 7.5 g/dL (ref 6.0–8.3)
Total Bilirubin: 0.7 mg/dL (ref 0.3–1.2)
Total Bilirubin: 0.7 mg/dL (ref 0.3–1.2)
Total Protein: 7.3 g/dL (ref 6.0–8.3)

## 2013-10-14 LAB — TSH: TSH: 0.78 u[IU]/mL (ref 0.350–4.500)

## 2013-10-14 LAB — CBC WITH DIFFERENTIAL/PLATELET
Basophils Absolute: 0 10*3/uL (ref 0.0–0.1)
Basophils Relative: 0 % (ref 0–1)
Eosinophils Absolute: 0 10*3/uL (ref 0.0–0.7)
Eosinophils Relative: 0 % (ref 0–5)
HCT: 38.6 % — ABNORMAL LOW (ref 39.0–52.0)
Hemoglobin: 12.3 g/dL — ABNORMAL LOW (ref 13.0–17.0)
LYMPHS ABS: 0.8 10*3/uL (ref 0.7–4.0)
LYMPHS PCT: 9 % — AB (ref 12–46)
MCH: 29.4 pg (ref 26.0–34.0)
MCHC: 31.9 g/dL (ref 30.0–36.0)
MCV: 92.3 fL (ref 78.0–100.0)
Monocytes Absolute: 0.5 10*3/uL (ref 0.1–1.0)
Monocytes Relative: 7 % (ref 3–12)
NEUTROS ABS: 6.9 10*3/uL (ref 1.7–7.7)
NEUTROS PCT: 84 % — AB (ref 43–77)
Platelets: 277 10*3/uL (ref 150–400)
RBC: 4.18 MIL/uL — AB (ref 4.22–5.81)
RDW: 14.2 % (ref 11.5–15.5)
WBC: 8.2 10*3/uL (ref 4.0–10.5)

## 2013-10-14 LAB — CULTURE, BLOOD (ROUTINE X 2)
CULTURE: NO GROWTH
CULTURE: NO GROWTH

## 2013-10-14 LAB — CK TOTAL AND CKMB (NOT AT ARMC)
CK, MB: 6.6 ng/mL — AB (ref 0.3–4.0)
Relative Index: INVALID (ref 0.0–2.5)
Total CK: 81 U/L (ref 7–232)

## 2013-10-14 LAB — GLUCOSE, CAPILLARY
GLUCOSE-CAPILLARY: 198 mg/dL — AB (ref 70–99)
GLUCOSE-CAPILLARY: 209 mg/dL — AB (ref 70–99)
GLUCOSE-CAPILLARY: 203 mg/dL — AB (ref 70–99)

## 2013-10-14 LAB — MRSA PCR SCREENING: MRSA BY PCR: NEGATIVE

## 2013-10-14 LAB — MAGNESIUM
MAGNESIUM: 2.2 mg/dL (ref 1.5–2.5)
Magnesium: 2.1 mg/dL (ref 1.5–2.5)

## 2013-10-14 LAB — PROTIME-INR
INR: 2.84 — ABNORMAL HIGH (ref 0.00–1.49)
PROTHROMBIN TIME: 29.8 s — AB (ref 11.6–15.2)

## 2013-10-14 LAB — PROCALCITONIN: Procalcitonin: 1.86 ng/mL

## 2013-10-14 LAB — PHOSPHORUS: Phosphorus: 4.5 mg/dL (ref 2.3–4.6)

## 2013-10-14 LAB — TROPONIN I
TROPONIN I: 0.54 ng/mL — AB (ref <0.30)
Troponin I: 0.88 ng/mL (ref <0.30)

## 2013-10-14 MED ORDER — DIGOXIN 0.25 MG/ML IJ SOLN
0.2500 mg | Freq: Once | INTRAMUSCULAR | Status: DC
  Filled 2013-10-14: qty 1

## 2013-10-14 MED ORDER — PHENYLEPHRINE HCL 10 MG/ML IJ SOLN
30.0000 ug/min | INTRAVENOUS | Status: DC
  Filled 2013-10-14: qty 4

## 2013-10-14 MED ORDER — LORAZEPAM 2 MG/ML IJ SOLN: 2.0000 mg | INTRAMUSCULAR | Status: DC | PRN

## 2013-10-14 MED ORDER — LORAZEPAM 2 MG/ML IJ SOLN
1.0000 mg | Freq: Once | INTRAMUSCULAR | Status: AC
  Administered 2013-10-14: 1 mg via INTRAVENOUS
  Filled 2013-10-14: qty 1

## 2013-10-14 MED ORDER — LORAZEPAM 2 MG/ML IJ SOLN: 1.0000 mg | INTRAMUSCULAR | Status: DC | PRN

## 2013-10-14 MED ORDER — EPINEPHRINE HCL 1 MG/ML IJ SOLN
INTRAMUSCULAR | Status: AC
  Administered 2013-10-14: 0.5 mg
  Filled 2013-10-14: qty 1

## 2013-10-14 MED ORDER — INSULIN ASPART 100 UNIT/ML ~~LOC~~ SOLN: 0.0000 [IU] | SUBCUTANEOUS | Status: DC

## 2013-10-14 MED ORDER — DIGOXIN 0.0625 MG HALF TABLET: 0.0625 mg | ORAL_TABLET | ORAL | Status: DC

## 2013-10-15 ENCOUNTER — Ambulatory Visit: Payer: MEDICARE | Admitting: Internal Medicine

## 2013-10-15 DIAGNOSIS — Z5181 Encounter for therapeutic drug level monitoring: Secondary | ICD-10-CM

## 2013-10-15 DIAGNOSIS — I4891 Unspecified atrial fibrillation: Secondary | ICD-10-CM

## 2013-10-15 MED FILL — Medication: Qty: 1 | Status: AC

## 2013-10-15 MED FILL — Medication: Qty: 1 | Status: CN

## 2013-10-20 LAB — CULTURE, BLOOD (ROUTINE X 2)
CULTURE: NO GROWTH
Culture: NO GROWTH

## 2013-11-08 NOTE — Progress Notes (Signed)
Attempted ABG x's 2 unsuccessful, Notified RN

## 2013-11-08 NOTE — Procedures (Signed)
Central Venous Catheter Insertion Procedure Note GRANGER CHUI [id number] 03-18-1931  Procedure: Insertion of Central Venous Catheter Indications: Assessment of intravascular volume, Drug and/or fluid administration and Frequent blood sampling  Procedure Details Consent: Risks of procedure as well as the alternatives and risks of each were explained to the (patient/caregiver).  Consent for procedure obtained. Time Out: Verified patient identification, verified procedure, site/side was marked, verified correct patient position, special equipment/implants available, medications/allergies/relevent history reviewed, required imaging and test results available.  Performed  Maximum sterile technique was used including antiseptics, cap, gloves, gown, hand hygiene, mask and sheet. Skin prep: Chlorhexidine; local anesthetic administered A antimicrobial bonded/coated triple lumen catheter was placed in the left internal jugular vein using the Seldinger technique. Ultrasound guidance used.Yes.   Catheter placed to 20 cm. Blood aspirated via all 3 ports and then flushed x 3. Line sutured x 2 and dressing applied.  Evaluation Blood flow good Complications: No apparent complications Patient did tolerate procedure well. Chest X-ray ordered to verify placement.  CXR: pending. Procedure performed by K. Whitehart ANP-BC  Richardson Landry Minor ACNP Maryanna Shape PCCM Pager [phone] till 3 pm If no answer page [phone] 21-Oct-2013, 10:39 AM   I was present for and supervised the entire procedure  Merton Border, MD ; [facility] service Mobile [phone].  After 5:30 PM or weekends, call [phone]

## 2013-11-08 NOTE — Progress Notes (Signed)
Patient requesting a sleep aid.  Chaney Malling, NP notified.  Order received and implemented for lorazepam 1 mg IV. Will continue to monitor.  Jodell Cipro

## 2013-11-08 NOTE — Progress Notes (Signed)
Chaplain responded to code. No family present. Chaplain spoke with nurse who contacted family. Chaplain will meet with family upon pt transfer.   November 06, 2013 0900  Clinical Encounter Type  Visited With Health care provider  Visit Type Code  Rolly Salter, Chaplain 2013-11-06 9:24 AM

## 2013-11-08 NOTE — Consult Note (Addendum)
CARDIOLOGY CONSULT NOTE   Patient ID: Alan [name] MRN: [id number] DOB/AGE: 05/29/1931 80 y.o.  Admit Date: 10/12/2013 Primary Physician: Limmie Patricia, MD Primary Cardiologist     Peter Martinique Reason for Consultation:   Clinical Summary Mr. Groeneveld is a 80 y.o.male.  He is admitted with generalized weakness and decreased mental status. This occurred after his most recent dialysis. Cardiology is consulted because of increased heart rate and mild troponin elevation.  There is an extensive cardiac history. The patient is followed by Dr. Martinique. He underwent CABG in 1996. His last catheterization was 2005. He has known severe aortic stenosis. His last echo was October, 2014. At that time he had severe aortic stenosis. Ejection fraction was 60%. Over time there has been careful consideration as to whether or not the patient would be a candidate for treatment of his aortic valve. Ultimately it was decided that he is not a candidate for either surgery or TAVR. During the hospitalization in October, 2014 he was decided that the patient should have palliative care consultation. This was done. It was clear at that time that the patient will receive ongoing medical therapy. It was noted that he might get to the point where he could no longer tolerate dialysis. There is additional history of chronic atrial fibrillation. He receives Coumadin for this. He also has severe peripheral arterial disease.  The patient has also had significant orthostatic hypotension. From the viewpoint of his aortic stenosis, it would be optimal to not use midodrine. However there has been continued use because of an absolute need to treat his orthostasis.   Allergies  Allergen Reactions  . Ancef [Cefazolin] Rash    Patient cant recall if allergy to this med exists.     Medications Scheduled Medications: . allopurinol  150 mg Oral Daily  . aspirin EC  325 mg Oral Daily  . calcium carbonate  1 tablet  Oral TID WC  . gabapentin  100 mg Oral QHS  . insulin aspart  0-15 Units Subcutaneous TID WC  . insulin aspart  0-5 Units Subcutaneous QHS  . midodrine  10 mg Oral BID WC  . multivitamin  1 tablet Oral Daily  . sodium chloride  3 mL Intravenous Q12H  . Warfarin - Pharmacist Dosing Inpatient   Does not apply q1800     Infusions: . sodium chloride 50 mL/hr at 11-03-2013 0013     PRN Medications:  ondansetron (ZOFRAN) IV, ondansetron   Past Medical History  Diagnosis Date  . Diabetes mellitus   . Gout   . Hypertension   . Hyperlipidemia   . Arthritis   . Prostate cancer     a. s/p seed implant  . Cataract     a. s/p bilat cataract surgeries.  . Diabetic neuropathy   . Atrial fibrillation     a. chronic/rate controlled;  b. on coumadin  . Moderate aortic stenosis     a. 05/2012 Echo: EF 55-60%, mod AS, mildly dil LA - prev not felt to be a surgical candidate  . Anemia of chronic disease   . Coronary artery disease     a. remote CABG x 5 in 1996; b. 04/2003 Cath: LAD 100p, LCX min irregs, RCA 23m, VG->D1 nl, VG->AM->PDA->RPL nl, LIMA->LAD nl, EF 60%.  . Edema   . PAD (peripheral artery disease)     a. s/p bilat BKA's (L - 07/2011, R - 02/2012).  . Diabetic retinopathy   . Chronic anticoagulation  a. on coumadin  . End stage renal disease     a. Started dialysis October 2012  . Nephrolithiasis     a. prior history of percutaneous nephrostomy  . Orthostatic hypotension     a. on midodrine    Past Surgical History  Procedure Laterality Date  . Ventral hernia repair    . Inguinal hernia repair      right side  . Colonoscopy    . Kidney stone surgery    . Polypectomy    . Tonsillectomy    . Toe surgery      removal little toe right foot  . Cardiac catheterization  04/19/2003    SEVERE 2 VESSEL OBSTRUCTIVE ATHERSCLEROTIC CAD  . Coronary artery bypass graft  1996    LIMA GRAFT TO THE LAD, SEQUENTIAL SAPHENOUS  VEIN GRAFT TO THE FIRST AND SECOND DIAGONIAL BRANCHES,  SEQUENTIAL SAPHENOUS VEIN GRAFT TO THE ACUTE MARGINAL, POSTERIOR DESCENDING, AND POSTERIOR LATERAL BRANCHES OF THE RIGHT CORONARY ARTERY  . Radioactive seed implant    . US echocardiography  09/06/2009    EF 55-60%  . Cardiovascular stress test  04/13/2003    EF 54%. EVIDENCE OF ANTERO-APICAL ISCHEMIA. NORMAL LV SIZE AND FUNCTION  . Tee without cardioversion  05/09/2011    Procedure: TRANSESOPHAGEAL ECHOCARDIOGRAM (TEE);  Surgeon: Larey Dresser, MD;  Location: [facility];  Service: Cardiovascular;  Laterality: N/A;  . Hernia repair    . Amputation  07/20/2011    Procedure: AMPUTATION BELOW KNEE;  Surgeon: Serafina Mitchell, MD;  Location: [facility];  Service: Vascular;  Laterality: Left;  . Amputation  01/25/2012    Procedure: AMPUTATION DIGIT;  Surgeon: Angelia Mould, MD;  Location: [facility];  Service: Vascular;  Laterality: Right;  second  . Av fistula placement      right  . Transmetatarsal amputation Right 02/15/2012    Procedure: TRANSMETATARSAL AMPUTATION;  Surgeon: Serafina Mitchell, MD;  Location: [facility] OR;  Service: Vascular;  Laterality: Right;  . Amputation Right 02/29/2012    Procedure: AMPUTATION BELOW KNEE;  Surgeon: Serafina Mitchell, MD;  Location: [facility] OR;  Service: Vascular;  Laterality: Right;  Right BKA    Family History  Problem Relation Age of Onset  . Heart disease Father   . Hypertension Brother   . Stroke Brother   . Diabetes Mother     Pt. not sure, but he thinks she did.    Social History Mr. Hott reports that he quit smoking about 45 years ago. His smoking use included Cigarettes. He smoked 0.00 packs per day for 2 years. He has quit using smokeless tobacco. His smokeless tobacco use included Chew. Mr. Poer reports that he does not drink alcohol.  Review of Systems  The patient is only minimally responsive. He is not able to give me a complete review of systems. He does not appear to have any chest pain or shortness breath at this time.  Physical  Examination Blood pressure 82/48, pulse 112, temperature 98.4 F (36.9 C), temperature source Oral, resp. rate 20, height 5' (1.524 m), weight 218 lb (98.884 kg), SpO2 100.00%. No intake or output data in the 24 hours ending 17-Oct-2013 0725  The patient responds but does not appear to be oriented. He is in no acute distress at this time. Head is atraumatic. There is no jugulovenous distention. Lungs reveal scattered rhonchi. Cardiac exam reveals an S1 and S2. There is a systolic murmur. Abdomen is soft. He has a bilateral lower extremity  amputee.  Prior Cardiac Testing/Procedures  Lab Results  Basic Metabolic Panel:  Recent Labs Lab 10/08/13 0715 11/05/2013 1724 2013-10-28 0530  NA 134* 135* 136*  K 4.1 5.2 4.6  CL 94* 93* 95*  CO2 25 26 20   GLUCOSE 147* 211* 218*  BUN 37* 18 24*  CREATININE 7.22* 4.95* 6.00*  CALCIUM 9.3 8.8 8.7  MG  --   --  2.1  PHOS  --   --  4.5    Liver Function Tests:  Recent Labs Lab 10/08/13 0715 10/12/2013 1724 October 28, 2013 0530  AST 79* 153* 95*  ALT 109* 174* 141*  ALKPHOS 97 125* 118*  BILITOT 0.6 0.6 0.7  PROT 7.1 8.3 7.3  ALBUMIN 3.1* 3.5 3.3*    CBC:  Recent Labs Lab 10/08/13 0715 10/08/2013 1724  WBC 7.1 7.7  NEUTROABS  --  5.0  HGB 11.9* 13.0  HCT 37.0* 40.1  MCV 90.5 93.3  PLT 234 365    Cardiac Enzymes:  Recent Labs Lab 10/09/2013 1743 10/28/13 0304  CKTOTAL  --  81  CKMB  --  6.6*  TROPONINI 0.41* 0.54*    BNP: No components found with this basename: POCBNP,    Radiology: Ct Head Wo Contrast  10/18/2013   CLINICAL DATA:  Initial encounter for altered mental status following dialysis. Confusion.  EXAM: CT HEAD WITHOUT CONTRAST  TECHNIQUE: Contiguous axial images were obtained from the base of the skull through the vertex without intravenous contrast.  COMPARISON:  None.  FINDINGS: Mild atrophy and white matter disease is present bilaterally. No acute cortical infarct or hemorrhage is present. Remote infarcts are present  in the cerebellum bilaterally. The ventricles are proportionate to the degree of atrophy. No significant extra-axial fluid collection is present.  The paranasal sinuses and mastoid air cells are clear. The osseous skull is intact.  IMPRESSION: 1. Moderate generalized atrophy and white matter disease. This is nonspecific, but likely reflects the sequela of chronic microvascular ischemia. 2. No acute intracranial abnormality.   Electronically Signed   By: Lawrence Santiago M.D.   On: 10/19/2013 22:13   Dg Chest Portable 1 View  10/24/2013   CLINICAL DATA:  Altered mental status. Diaphoretic after dialysis today.  EXAM: PORTABLE CHEST - 1 VIEW  COMPARISON:  10/05/2013 and 01/23/2012  FINDINGS: There is stable mild cardiomegaly. Prior CABG. Pulmonary vascularity is normal and the lungs are clear. No acute osseous abnormality.  IMPRESSION: No acute disease.  Stable chronic cardiomegaly.   Electronically Signed   By: Rozetta Nunnery M.D.   On: 11/05/2013 19:37     ECG:   I have reviewed old and current EKGs. Over time his most recent rhythm has probably been in atrial flutter. I cannot rule out the possibility that it would be called atrial tachycardia. He has old anterior Q waves. There are diffuse nonspecific ST changes. There has been no acute change this admission.  Telemetry:   I have personally reviewed telemetry. The rhythm is similar to what we have seen in the past. It is most likely atrial flutter. The rate ranges from 100 up to 145.     Impression and Recommendations    suspected Dialysis disequilibrium syndrome     This diagnosis has been listed by others in the chart.    Aortic stenosis      We know that the patient has severe aortic stenosis. There has been very careful assessment and repeated review. It has been made clear that the patient is not  a candidate for aortic valve surgery or TAVR.    CAD (coronary artery disease)     The patient has known coronary disease with history of CABG in  the past. He is not a candidate for any aggressive cardiac intervention at this time.    End stage renal disease on dialysis     It is my understanding that it is known that the patient may get to the place for he can no longer tolerate dialysis. This decision of course will be up to the patient and his nephrologist. With his presentation, he has been receiving IV fluids. Of course this will be problematic if he becomes volume overloaded.    Chronic anticoagulation      Despite multitude of problems, anticoagulation has been continued appropriately for his atrial arrhythmia.      Atrial fibrillation       There is history of atrial fibrillation. Most recently his rhythm is probably atrial flutter. We will not be able to use most medications that would normally help control rate. I have carefully reviewed whether or not to digoxin would be appropriate in this setting. We have very few therapeutic options. Controlling his heart rate better may help in this situation. I have therefore decided to start digoxin. The intent in my orders he is to load him today and then continue 0.0625 mg every other day.    Orthostatic hypotension     There is a fine line being followed by his physicians concerning whether or not midodrine can be used.    Acute encephalopathy     The patient has had intermittent changes in his mental status over time. I doubt that this is due primarily to his atrial arrhythmia at this time.    Elevated troponin    His troponin elevation at this time he is nonspecific. Review of his record shows that he does not appear to have chronically elevated troponin.  Regardless of the cause, he is not a candidate for any type of coronary intervention. We will need to control any issues that might cause demand ischemia. This would include doing the best we can to stabilize his blood pressure and control his heart rate within reason.  I have noted that the patient has previously had a palliative  care consult. I will have to leave it up to the primary team as to whether or not the palliative care team can help further today.   Daryel November, MD  October 22, 2013, 7:25 AM

## 2013-11-08 NOTE — ED Provider Notes (Signed)
Medical screening examination/treatment/procedure(s) were conducted as a shared visit with resident physician and myself.  I personally evaluated the patient during the encounter.   EKG Interpretation  Date/Time:  Tuesday October 13 2013 17:33:14 EDT Ventricular Rate:  115 PR Interval:  104 QRS Duration: 107 QT Interval:  357 QTC Calculation: 494 R Axis:   170 Text Interpretation:  Sinus tachycardia Anterior infarct, old Repol abnrm, severe global ischemia (LM/MVD) Confirmed by Kiley Torrence  MD, Keyvon Herter ([id number]) on 10/17/2013 8:56:33 PM      I interviewed and examined the patient. Lungs are CTAB. Cardiac exam wnl. Abdomen soft.  Pt from HD w/ AMS and hypotension. It looks as if his BP runs low in my review of previous admissions/visits. Plan for admission.   Pamella Pert, MD 10-20-2013 1241

## 2013-11-08 NOTE — Progress Notes (Signed)
Respiratory therapy note-Unable to obtain ABG earlier. Went to assess patient for another ABG attempt, very diaphoretic and lethargic but able to speak. Patient then became unresponsive and called for support. CPR performed and BMV with 100%. Rapid response and anesthesia arrived, patient was breathing spontaneously but shallow, Bipap was placed for transport on 100%. Report given to therapist in ICU 2900. Dr. Alva Garnet at bedside at this time.

## 2013-11-08 NOTE — Discharge Summary (Signed)
Death Summary  [name] TDH:[id number] DOB: May 21, 1931 DOA: 17-Oct-2013  PCP: Limmie Patricia, MD  Admit date: 17-Oct-2013 Date of Death: 10-18-2013  Final Diagnoses:    PEA cardiac arrest   Elevated troponin   Aortic stenosis   CAD (coronary artery disease)   End stage renal disease on dialysis   Chronic anticoagulation   Hypotension   Atrial fibrillation   Hx of BKA, bilateral   Orthostatic hypotension   Acute encephalopathy   Elevated transaminase level   DM (diabetes mellitus), type 2 with renal complications   Sinus tachycardia   History of present illness:  Chief Complaint: Confusion and generalized weakness  HPI: Alan [name] is a 80 y.o. male with Past medical history of ESRD on hemodialysis Tuesday Thursday Saturday, hypertension, diabetes, prostate cancer, A. fib on Coumadin, morbid AST, orthostatic hypotension, bilateral amputation due to peripheral vascular disease.  The patient presented with complaints of generalized weakness that started after the dialysis. Patient mentions that he was fine when he went for the hemodialysis and did not have any complaints but after the dialysis he started having progressively worsening generalized weakness. While he was in the car, he has to be helped out of the car. he was significantly confused and initially was obtunded. When patient was more awake he started complains of substernal chest pain which he described as a burning pain. He also had an episode of nausea and vomiting at the same time. He denies any diarrhea. He denies any changes in her medication. He mentions he has been on hemodialysis for last 3 years.  At the time of my evaluation there was significant improvement in his mental status and he was able to time and that he did not have any nausea, chest pain, shortness of breath. No fever no chills no diarrhea no constipation  Hospital Course:  Mr.Mcevoy was admitted 10/18/22 at 11:20pm with weakness and  confusion, he was found to have mildly elevated troponin. Seen by Cards early next am, subsequently he had a PEA arrest and Code Blue was called CPR initiated, epi x1 given his mental status improved and was able to protect his airway. He was then transferred to ICU at 8:30am under PCCM service. He was then made DNR after discussion between family and Dr.Simonds, later again in the morning he had another PEA arrest, this time Code blue was not called and he subsequently passed away   Time: 44min  Signed:  Saphire Barnhart  [facility] 10-18-13, 6:32 PM

## 2013-11-08 NOTE — Consult Note (Signed)
PULMONARY / CRITICAL CARE MEDICINE   Name: Alan [name] MRN: [id number] DOB: [birth year]    ADMISSION DATE:  10/17/2013 CONSULTATION DATE:  10/7  REFERRING MD :  [facility]  CHIEF COMPLAINT:  PEA arrest  INITIAL PRESENTATION: Chest pain  STUDIES:    SIGNIFICANT EVENTS: 10/7 PEA arrest   HISTORY OF PRESENT ILLNESS:  80 yo male with a plethora of health issues and was admitted 10/6 following dialysis for confusion and weakness. Troponin was elevated at 0.41 and cardiology was consulted. Admitted to tele floor(CAD/CAF on coumadin) and Dr. Ron Parker of cardiology evaluated him. Non specific cardiology findings. He was on floor and on 10/7 0940 had a PEA arrest. Cpr x 3 minutes with Epi x 1 and ROSC. Placed on Bipap and transferred to CICU and PCCM consulted. He did not required intubation on first observation and was NIMVS was discontinued. Note he has had  a palliative care consult in the past but is listed as a full code. Note he is chronically hypotensive and is on Midodrine.  PAST MEDICAL HISTORY :   has a past medical history of Diabetes mellitus; Gout; Hypertension; Hyperlipidemia; Arthritis; Prostate cancer; Cataract; Diabetic neuropathy; Atrial fibrillation; Moderate aortic stenosis; Anemia of chronic disease; Coronary artery disease; Edema; PAD (peripheral artery disease); Diabetic retinopathy; Chronic anticoagulation; End stage renal disease; Nephrolithiasis; and Orthostatic hypotension.  has past surgical history that includes Ventral hernia repair; Inguinal hernia repair; Colonoscopy; Kidney stone surgery; Polypectomy; Tonsillectomy; Toe Surgery; Cardiac catheterization (04/19/2003); Coronary artery bypass graft (1996); Radioactive seed implant; US echocardiography (09/06/2009); Cardiovascular stress test (04/13/2003); TEE without cardioversion (05/09/2011); Hernia repair; Amputation (07/20/2011); Amputation (01/25/2012); AV fistula placement; Transmetatarsal amputation (Right, 02/15/2012); and  Amputation (Right, 02/29/2012). Prior to Admission medications   Medication Sig Start Date End Date Taking? Authorizing Provider  allopurinol (ZYLOPRIM) 300 MG tablet Take 0.5 tablets (150 mg total) by mouth daily. 02/06/12  Yes Daniel J Angiulli, PA-C  aspirin EC 81 MG tablet Take 1 tablet (81 mg total) by mouth daily. 02/06/12  Yes Daniel J Angiulli, PA-C  calcium carbonate (TUMS - DOSED IN MG ELEMENTAL CALCIUM) 500 MG chewable tablet Chew 1 tablet by mouth 3 (three) times daily with meals. Phosphorous binder   Yes Historical Provider, MD  gabapentin (NEURONTIN) 100 MG capsule Take 100 mg by mouth at bedtime.   Yes Historical Provider, MD  glimepiride (AMARYL) 4 MG tablet Take 2 mg by mouth daily before breakfast.   Yes Historical Provider, MD  midodrine (PROAMATINE) 10 MG tablet Take 1 tablet (10 mg total) by mouth Every Tuesday,Thursday,and Saturday with dialysis. 10/15/12  Yes Annita Brod, MD  multivitamin (RENA-VIT) TABS tablet Take 1 tablet by mouth daily.   Yes Historical Provider, MD  simvastatin (ZOCOR) 40 MG tablet Take 20 mg by mouth at bedtime.   Yes Historical Provider, MD  warfarin (COUMADIN) 5 MG tablet Take 2.5-5 mg by mouth See admin instructions. Take 0.5 tablet on Wednesday. Take 1 tablet all other days.   Yes Historical Provider, MD   Allergies  Allergen Reactions  . Ancef [Cefazolin] Rash    Patient cant recall if allergy to this med exists.     FAMILY HISTORY:  indicated that his mother is deceased. He indicated that his father is deceased. He indicated that his sister is alive. He indicated that his brother is deceased.  SOCIAL HISTORY:  reports that he quit smoking about 45 years ago. His smoking use included Cigarettes. He smoked 0.00 packs per day for 2 years.  He has quit using smokeless tobacco. His smokeless tobacco use included Chew. He reports that he does not drink alcohol or use illicit drugs.  REVIEW OF SYSTEMS: NA  SUBJECTIVE:   VITAL SIGNS: Temp:   [96.1 F (35.6 C)-98.4 F (36.9 C)] 97.8 F (36.6 C) (10/07 0900) Pulse Rate:  [67-117] 110 (10/07 0900) Resp:  [14-30] 18 (10/07 0900) BP: (76-151)/(23-117) 87/48 mmHg (10/07 0900) SpO2:  [97 %-100 %] 100 % (10/07 0900) Weight:  [218 lb (98.884 kg)] 218 lb (98.884 kg) (10/07 0359) HEMODYNAMICS:   VENTILATOR SETTINGS:   INTAKE / OUTPUT:  Intake/Output Summary (Last 24 hours) at 10/19/13 0952 Last data filed at 19-Oct-2013 0900  Gross per 24 hour  Intake      3 ml  Output      0 ml  Net      3 ml    PHYSICAL EXAMINATION: General:  Frail . Obese male who is critically ill appearing Neuro:  Lethargic HEENT:  No JVD Cardiovascular: HSIR IR Lungs: Diminished through out Abdomen:  Decreased bs Musculoskeletal:  Bilateral bka's Skin:  Cool and clammy  LABS:  CBC  Recent Labs Lab 10/08/13 0715 10/30/2013 1724 10/19/13 0530  WBC 7.1 7.7 8.2  HGB 11.9* 13.0 12.3*  HCT 37.0* 40.1 38.6*  PLT 234 365 277   Coag's  Recent Labs Lab 10/08/13 0500 10/09/13 0500 10/19/2013 0032  INR 3.74* 2.30* 2.84*   BMET  Recent Labs Lab 10/08/13 0715 10/10/2013 1724 10-19-2013 0530  NA 134* 135* 136*  K 4.1 5.2 4.6  CL 94* 93* 95*  CO2 25 26 20   BUN 37* 18 24*  CREATININE 7.22* 4.95* 6.00*  GLUCOSE 147* 211* 218*   Electrolytes  Recent Labs Lab 10/08/13 0715 10/31/2013 1724 10-19-13 0530  CALCIUM 9.3 8.8 8.7  MG  --   --  2.1  PHOS  --   --  4.5   Sepsis Markers  Recent Labs Lab 10/24/2013 1735  LATICACIDVEN 2.93*   ABG No results found for this basename: PHART, PCO2ART, PO2ART,  in the last 168 hours Liver Enzymes  Recent Labs Lab 10/08/13 0715 10/20/2013 1724 10-19-2013 0530  AST 79* 153* 95*  ALT 109* 174* 141*  ALKPHOS 97 125* 118*  BILITOT 0.6 0.6 0.7  ALBUMIN 3.1* 3.5 3.3*   Cardiac Enzymes  Recent Labs Lab 10/10/2013 1743 Oct 19, 2013 0304  TROPONINI 0.41* 0.54*   Glucose  Recent Labs Lab 10/09/13 0807 10/29/2013 1727 11/04/2013 2307 2013-10-19 0400  10/19/13 0725 10-19-2013 0911  GLUCAP 112* 180* 167* 198* 209* 203*    Imaging Ct Head Wo Contrast  10/08/2013   CLINICAL DATA:  Initial encounter for altered mental status following dialysis. Confusion.  EXAM: CT HEAD WITHOUT CONTRAST  TECHNIQUE: Contiguous axial images were obtained from the base of the skull through the vertex without intravenous contrast.  COMPARISON:  None.  FINDINGS: Mild atrophy and white matter disease is present bilaterally. No acute cortical infarct or hemorrhage is present. Remote infarcts are present in the cerebellum bilaterally. The ventricles are proportionate to the degree of atrophy. No significant extra-axial fluid collection is present.  The paranasal sinuses and mastoid air cells are clear. The osseous skull is intact.  IMPRESSION: 1. Moderate generalized atrophy and white matter disease. This is nonspecific, but likely reflects the sequela of chronic microvascular ischemia. 2. No acute intracranial abnormality.   Electronically Signed   By: Lawrence Santiago M.D.   On: 11/05/2013 22:13   Dg Chest Portable 1 View  11/01/2013   CLINICAL DATA:  Altered mental status. Diaphoretic after dialysis today.  EXAM: PORTABLE CHEST - 1 VIEW  COMPARISON:  10/05/2013 and 01/23/2012  FINDINGS: There is stable mild cardiomegaly. Prior CABG. Pulmonary vascularity is normal and the lungs are clear. No acute osseous abnormality.  IMPRESSION: No acute disease.  Stable chronic cardiomegaly.   Electronically Signed   By: Rozetta Nunnery M.D.   On: 11/07/2013 19:37     ASSESSMENT / PLAN:  PULMONARY OETT  A: Post PEA arrest May need intubation. 10/7 Made DNR P:   O2 as needed  No Intubation  Explore palliative care course if worsens  CARDIOVASCULAR CVL lt i j cvl 10/7>> A: Post PEA arrest CAD post Cabg CAF on coumadin Chronic hypotension on Midodrine  P:  Serial CE 12 Lead Cards eval Heparin drip  RENAL A:   ESRD with HD T. TH.Sat P:   Per  renal  GASTROINTESTINAL A:   NPO post arrest P:   No feeds at this time  HEMATOLOGIC A:   Chronic anticogulation P:  Follow INR Start heparin drip per pharm  INFECTIOUS A:   No overt indication of infectious state but will check BC for completeness P:   BCx2 10/7>>   ENDOCRINE A:   DM   P:   SSI  NEUROLOGIC A:   Lethargic post PEA arrest P:   RASS goal: 2 No sedation    Family updated:  10/7 per Dr. Alva Garnet. Now DNR.  Interdisciplinary Family Meeting v Palliative Care Meeting:    TODAY'S SUMMARY:  80 yo male with a plethora of health issues and was admitted 10/6 following dialysis for confusion and weakness. Troponin was elevated at 0.41 and cardiology was consulted. Admitted to tele floor(CAD/CAF on coumadin) and Dr. Ron Parker of cardiology evaluated him. Non specific cardiology findings. He was on floor and on 10/7 0940 had a PEA arrest. Cpr x 3 minutes with Epi x 1 and ROSC. Placed on Bipap and transferred to CICU and PCCM consulted. He did not required intubation on first observation and was NIMVS was discontinued. Note he has had  a palliative care consult in the past but is listed as a full code. Note he is chronically hypotensive and is on Midodrine.  PCCM ATTENDING: Seen with ACNP Minor and ACNP [place].  I have interviewed and examined the patient and reviewed the database. I have formulated the assessment and plan as reflected in the note above with amendments made by me. 40 mins of direct critical care time provided  After initially stabilizing pt and transferring to ICU, I undertook a detailed discussion with the patient's wife, daughter and son. We all acknowledged that he has been in failing health for a few years. He has not done well since initiation of dialysis 3 yrs ago. I think the acute event here is probably an acute coronary syndrome as he presented with SSCP and diaphoresis. As a result of our discussion, we agreed to medical mgmt but no further  CPR, no intubation. We agreed to discuss whether further HD was appropriate @ this point in his life. His wife questioned whether Hospice would be an option after discharge and we agreed to discuss that further.   Shortly after his arrival to the ICU, he suffered another brady-PEA arrest that did not respond to epinephrine. I rushed to the bedside and informed family that he was passing away. They were accepting of our limited interventions as outlined above stating that they didn't want  him to suffer anymore. The Chaplain has been called. Pt has been pronounced by RN. No autopsy is indicated from [place], MD;  PCCM service; [place] [phone]  11/03/13, 9:52 AM

## 2013-11-08 NOTE — Progress Notes (Signed)
Increased HR (150s) noted on telemetry, with subsequent decrease to 70s.  Patient cool and diaphoretic with shallow respirations, and unresponsive with blank stare upon entering his room.  Patient aroused after applying cold washcloth to face and checking CBG and vitals.  CBG 198.  BP 122/49, HR 112, RR 20, O2 sat 100% on RA.  Rapid response nurse notified.  Patient awake and stating, "I just want to take a nap."  Also asking for his prosthetic legs.  Lamar Blinks, NP notified.  Of note, pt received Ativan 1 mg IV at 0303.  Will continue to monitor.  Jodell Cipro

## 2013-11-08 NOTE — Progress Notes (Signed)
Pt transferred to 2H05 per MD order. Report given to Magda Paganini, [place]. Pt transported with rapid response RN at bedside. All belongings sent with pt. Lenna Sciara

## 2013-11-08 NOTE — Progress Notes (Signed)
2013/10/29 1100  Clinical Encounter Type  Visited With Patient and family together  Visit Type Follow-up;Critical Care  Referral From Nurse   Chaplain responded to a page 11:11 AM. Bonney Roussel was notified that the family had returned and that a Chaplain may be needed for support. Chaplain introduced himself to the patient's wife and another family member. The patient's wife asked for prayer but the other family member did not wish to have a Chaplain present. Chaplain allowed the family members to talk and they decided a Chaplain's presence was not needed at this time. Page Elodia Florence Chaplain if family decides they need additional support. Gar Ponto, Chaplain 11:29 AM

## 2013-11-08 NOTE — Progress Notes (Signed)
Asystole on monitor. Absent breath sounds, heart tones, and pupillary response. Family at bedside alongside Dr Alva Garnet. Chaplain called to room. Verified by two RN's.

## 2013-11-08 DEATH — deceased

## 2013-12-17 ENCOUNTER — Encounter (HOSPITAL_COMMUNITY): Payer: Self-pay | Admitting: Surgery

## 2014-03-21 IMAGING — CR DG CHEST 2V
2 series · 2 of 2 positions shown · non-contrast
Comparison: 05/04/2011

CLINICAL DATA: Preoperative evaluation for left leg amputation.

CHEST - 2 VIEW

[view not recorded (1 of 2)]
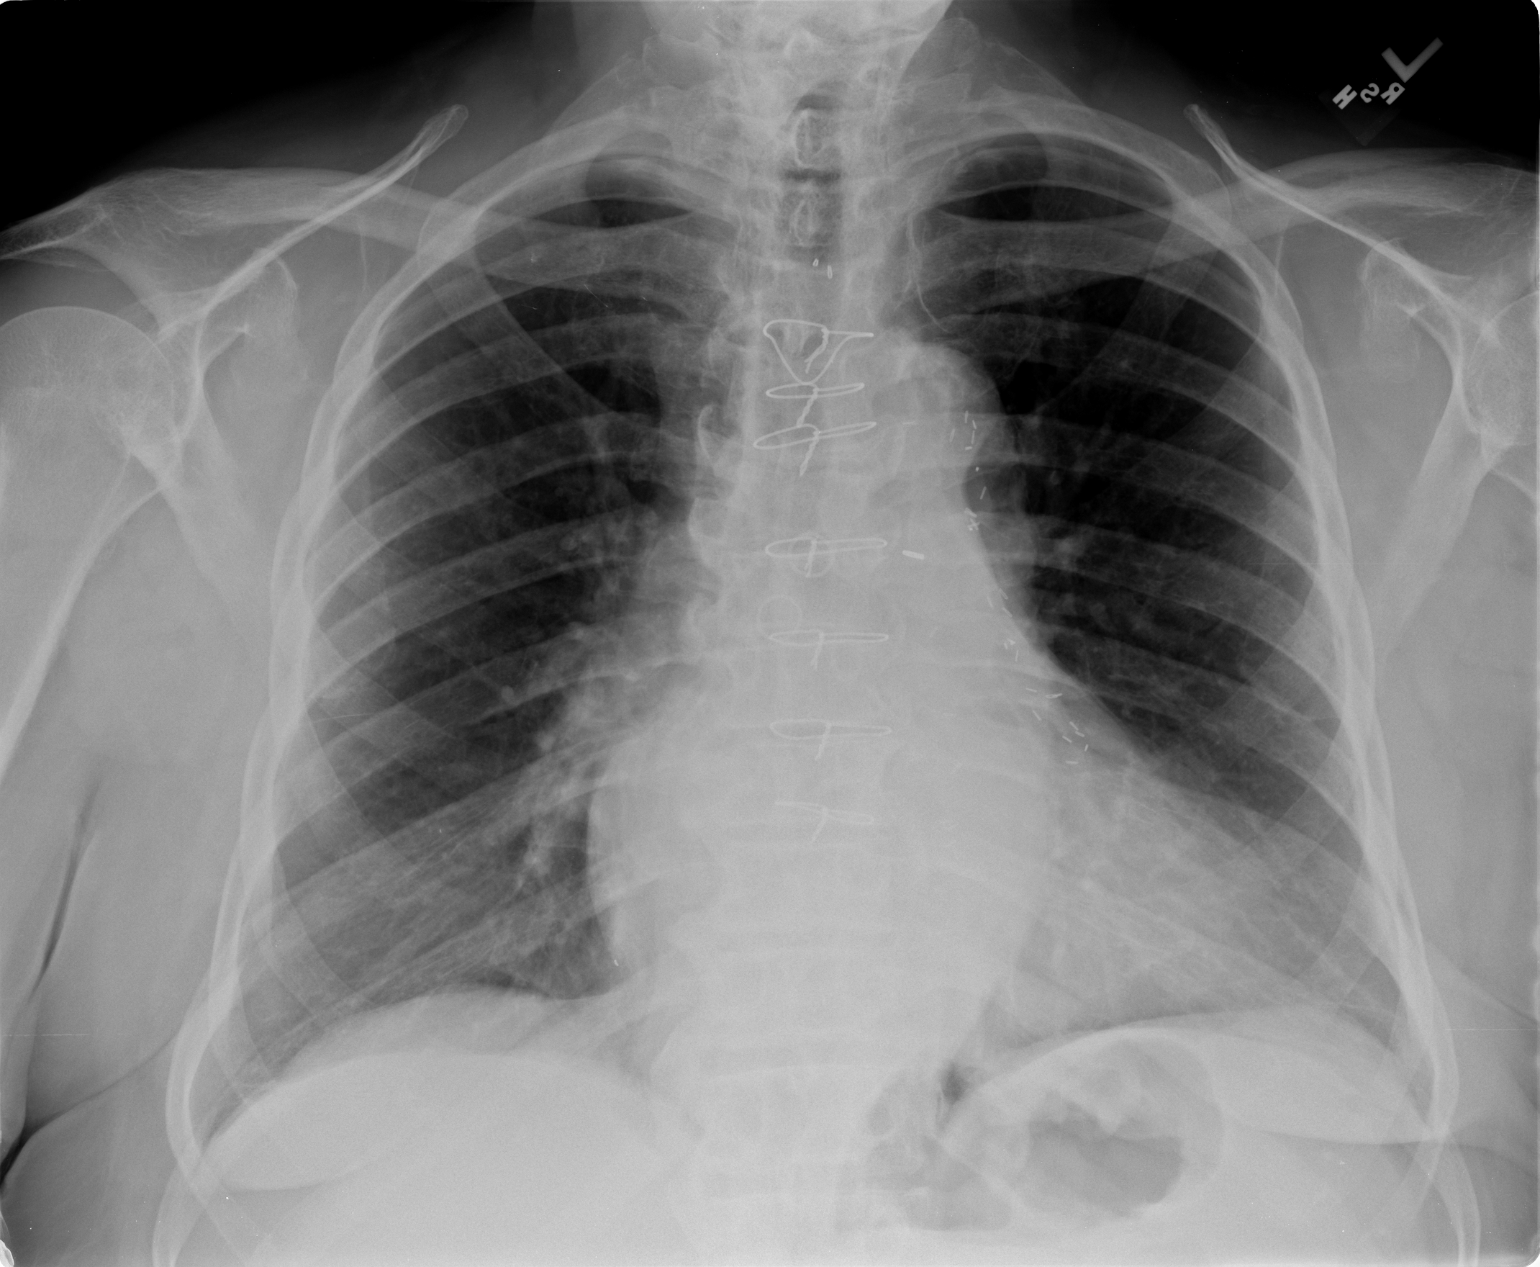

[view not recorded (2 of 2)]
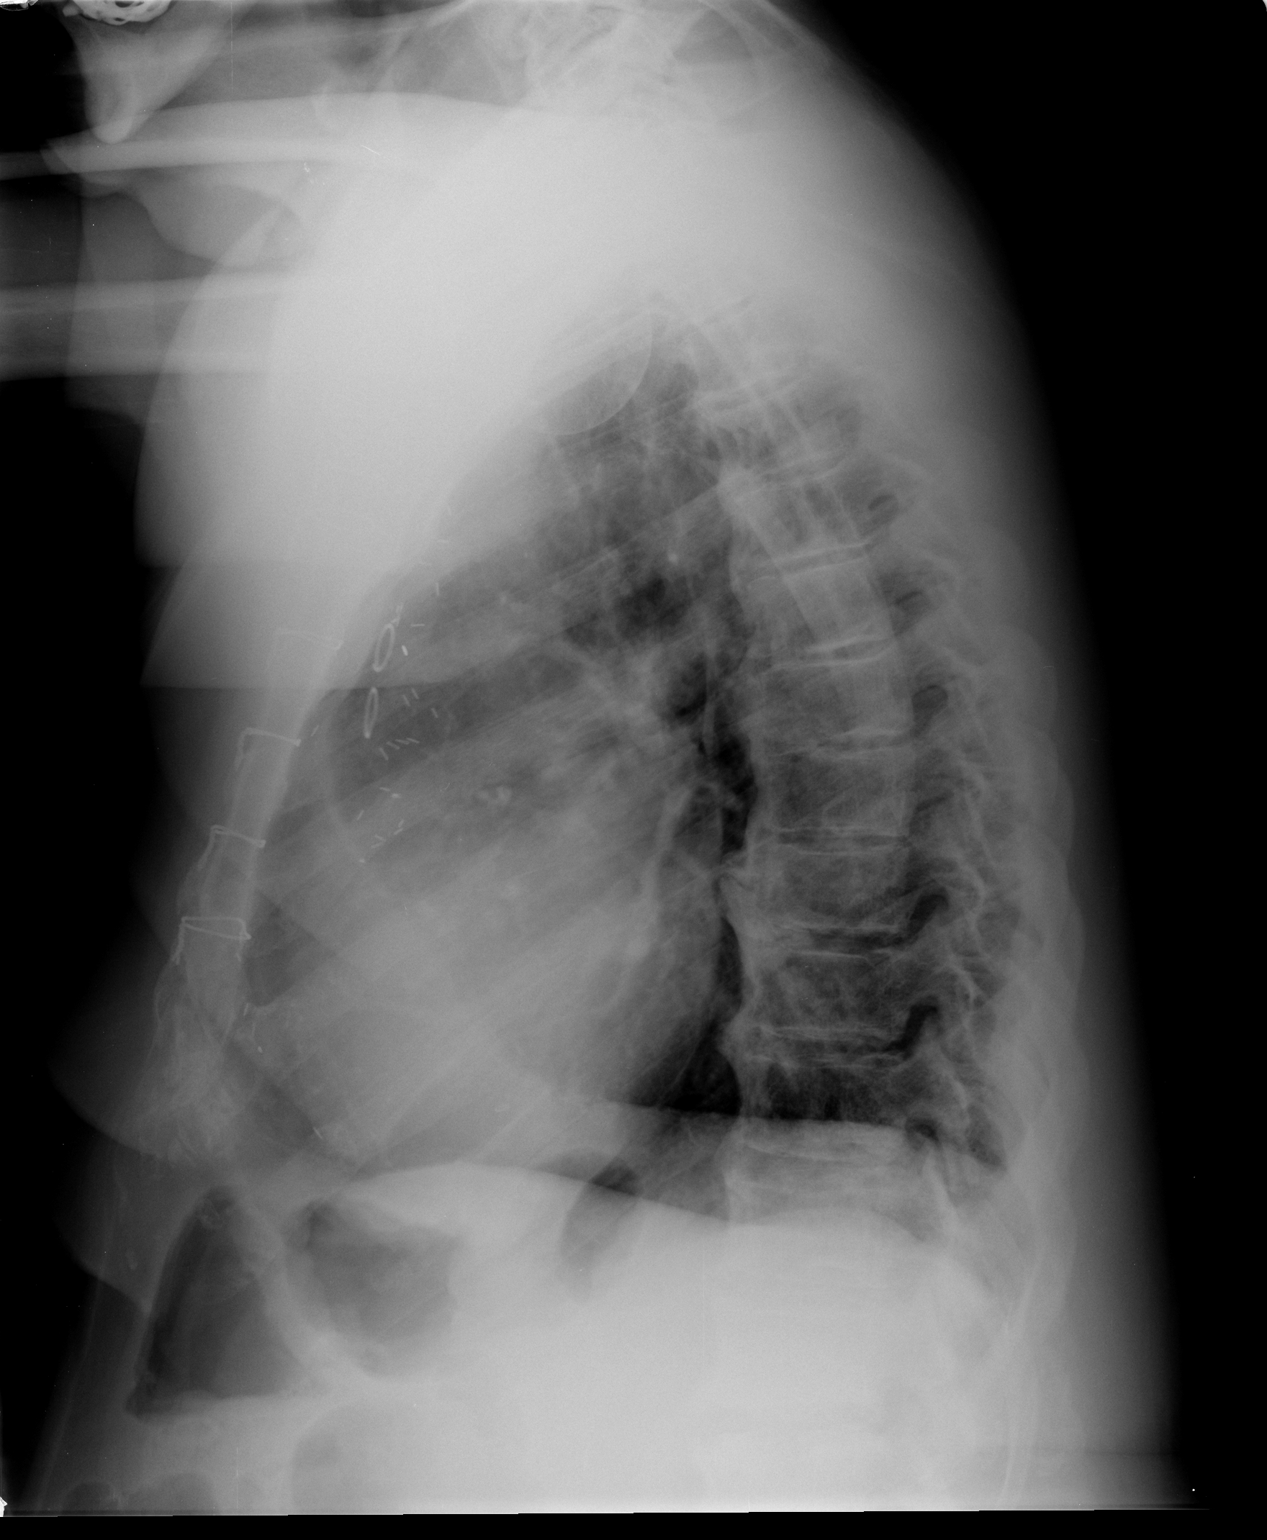

[2 of 2 positions shown; findings below may reference images not displayed]

FINDINGS: The lungs are clear without focal infiltrate, edema,
pneumothorax or pleural effusion. The cardiopericardial silhouette
is enlarged.  The patient is status post CABG Imaged bony
structures of the thorax are intact.
IMPRESSION: Stable.  No acute findings.

## 2015-06-13 IMAGING — CR DG CHEST 1V PORT
1 series · 1 of 1 positions shown · non-contrast
Comparison: 01/23/2012

CLINICAL DATA: Syncope after dialysis

PORTABLE CHEST - 1 VIEW

[AP]
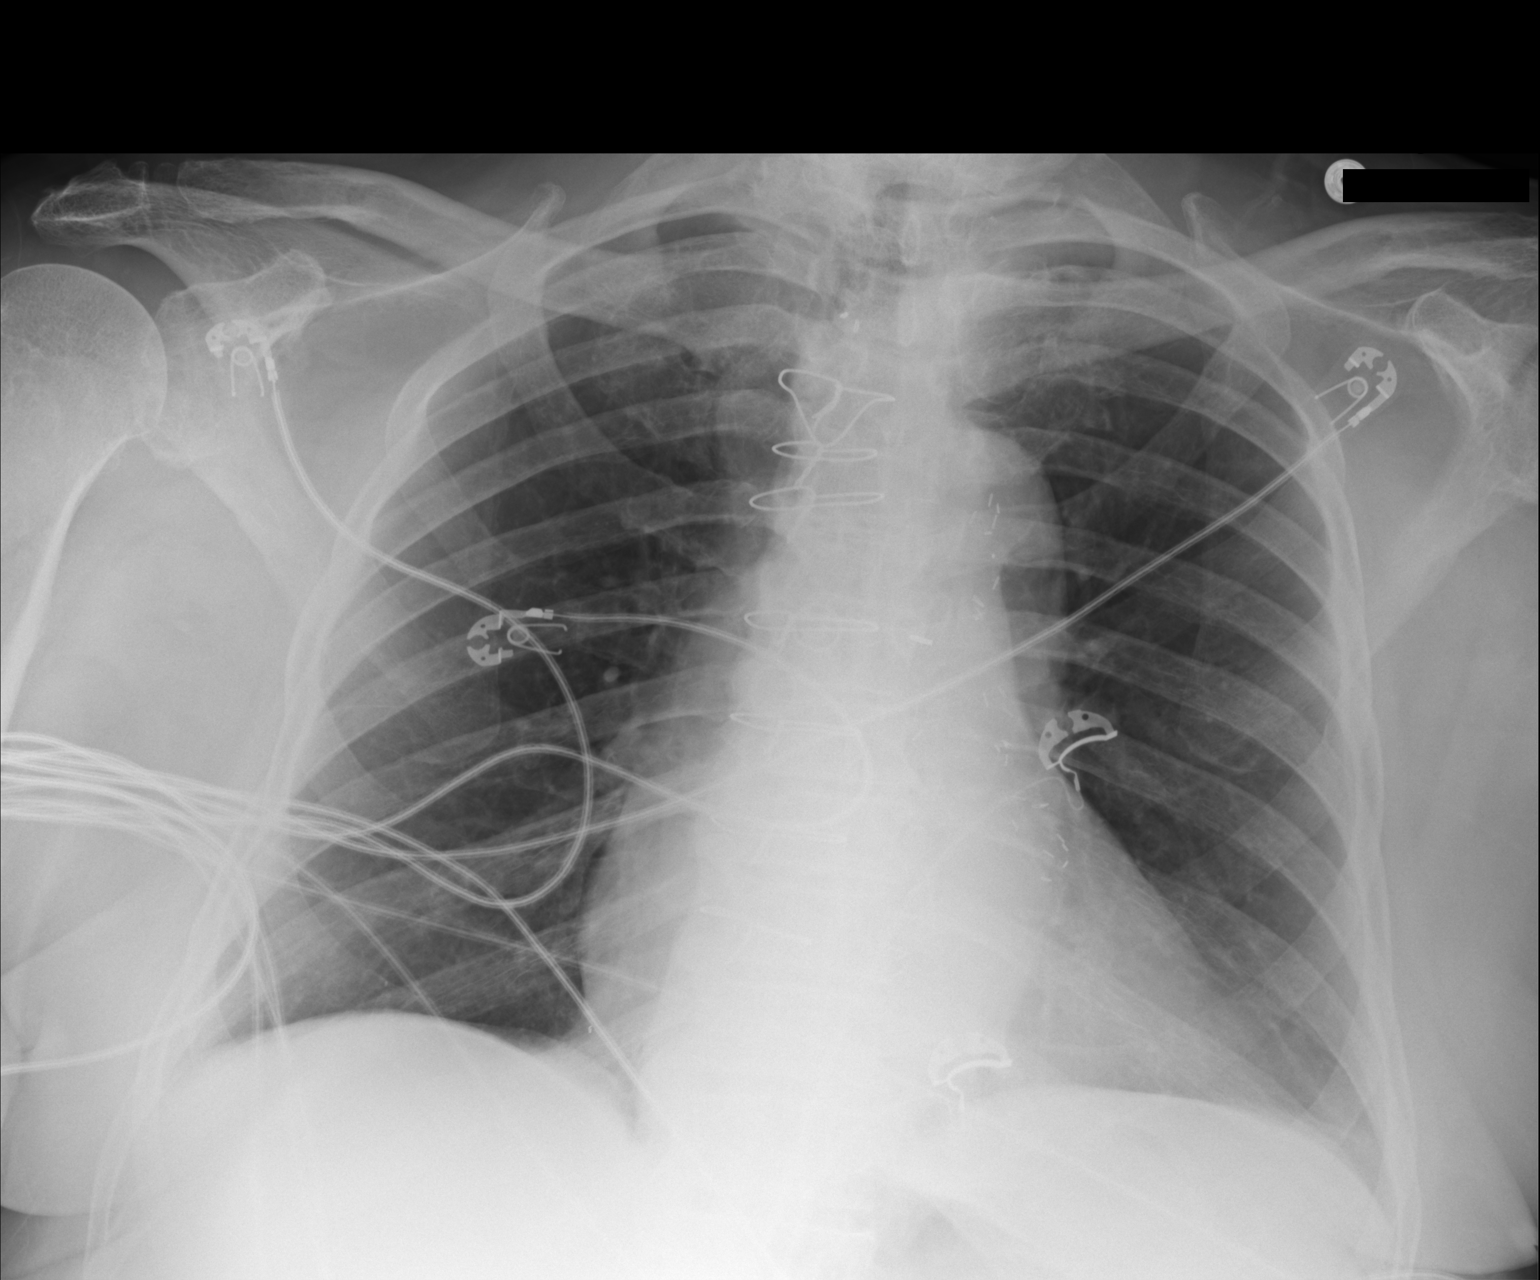

[1 of 1 positions shown; findings below may reference images not displayed]

FINDINGS: Heart size within normal limits.  Vascular pattern
normal.  Lungs clear except for mild lower lobe atelectasis on the
left.  The patient noted to be status post CABG.
IMPRESSION: No acute abnormalities
